# Patient Record
Sex: Female | Born: 1950
Health system: Southern US, Community
[De-identification: ages and names within clinical notes are randomized; demographics above are authoritative.]

## PROBLEM LIST (undated history)

## (undated) DIAGNOSIS — K219 Gastro-esophageal reflux disease without esophagitis: Secondary | ICD-10-CM

## (undated) DIAGNOSIS — F419 Anxiety disorder, unspecified: Secondary | ICD-10-CM

## (undated) DIAGNOSIS — E611 Iron deficiency: Secondary | ICD-10-CM

## (undated) DIAGNOSIS — L719 Rosacea, unspecified: Secondary | ICD-10-CM

## (undated) DIAGNOSIS — D649 Anemia, unspecified: Secondary | ICD-10-CM

## (undated) DIAGNOSIS — E039 Hypothyroidism, unspecified: Secondary | ICD-10-CM

## (undated) DIAGNOSIS — K5792 Diverticulitis of intestine, part unspecified, without perforation or abscess without bleeding: Secondary | ICD-10-CM

## (undated) DIAGNOSIS — J189 Pneumonia, unspecified organism: Secondary | ICD-10-CM

## (undated) DIAGNOSIS — F32A Depression, unspecified: Secondary | ICD-10-CM

## (undated) DIAGNOSIS — U071 COVID-19: Secondary | ICD-10-CM

## (undated) DIAGNOSIS — M1990 Unspecified osteoarthritis, unspecified site: Secondary | ICD-10-CM

## (undated) DIAGNOSIS — Z87442 Personal history of urinary calculi: Secondary | ICD-10-CM

## (undated) HISTORY — PX: APPENDECTOMY: SHX54

## (undated) HISTORY — PX: GASTRECTOMY: SHX58

## (undated) HISTORY — DX: Anxiety disorder, unspecified: F41.9

## (undated) HISTORY — PX: FRACTURE SURGERY: SHX138

## (undated) HISTORY — PX: COLONOSCOPY: SHX174

## (undated) HISTORY — PX: TUBAL LIGATION: SHX77

## (undated) HISTORY — PX: CATARACT EXTRACTION, BILATERAL: SHX1313

## (undated) HISTORY — PX: LITHOTRIPSY: SUR834

---

## 1977-08-03 HISTORY — PX: CHOLECYSTECTOMY OPEN: SUR202

## 1998-01-03 ENCOUNTER — Encounter: Admission: RE | Admit: 1998-01-03 | Discharge: 1998-04-03 | Payer: Self-pay | Admitting: Oncology

## 1998-01-04 ENCOUNTER — Ambulatory Visit (HOSPITAL_COMMUNITY): Admission: RE | Admit: 1998-01-04 | Discharge: 1998-01-04 | Payer: Self-pay | Admitting: Hematology and Oncology

## 1998-01-14 ENCOUNTER — Other Ambulatory Visit: Admission: RE | Admit: 1998-01-14 | Discharge: 1998-01-14 | Payer: Self-pay | Admitting: Oncology

## 1998-01-29 ENCOUNTER — Observation Stay (HOSPITAL_COMMUNITY): Admission: AD | Admit: 1998-01-29 | Discharge: 1998-01-30 | Payer: Self-pay | Admitting: Oncology

## 1998-02-15 ENCOUNTER — Observation Stay (HOSPITAL_COMMUNITY): Admission: AD | Admit: 1998-02-15 | Discharge: 1998-02-15 | Payer: Self-pay | Admitting: Oncology

## 1998-02-18 ENCOUNTER — Ambulatory Visit (HOSPITAL_COMMUNITY): Admission: RE | Admit: 1998-02-18 | Discharge: 1998-02-18 | Payer: Self-pay | Admitting: Oncology

## 1998-02-22 ENCOUNTER — Observation Stay (HOSPITAL_COMMUNITY): Admission: RE | Admit: 1998-02-22 | Discharge: 1998-02-22 | Payer: Self-pay | Admitting: Oncology

## 1998-03-27 ENCOUNTER — Ambulatory Visit (HOSPITAL_COMMUNITY): Admission: RE | Admit: 1998-03-27 | Discharge: 1998-03-27 | Payer: Self-pay | Admitting: Oncology

## 1998-03-27 ENCOUNTER — Encounter: Payer: Self-pay | Admitting: Oncology

## 1998-04-05 ENCOUNTER — Encounter: Admission: RE | Admit: 1998-04-05 | Discharge: 1998-07-04 | Payer: Self-pay | Admitting: Oncology

## 1998-04-06 ENCOUNTER — Observation Stay (HOSPITAL_COMMUNITY): Admission: AD | Admit: 1998-04-06 | Discharge: 1998-04-06 | Payer: Self-pay | Admitting: Oncology

## 1998-04-12 ENCOUNTER — Ambulatory Visit (HOSPITAL_COMMUNITY): Admission: RE | Admit: 1998-04-12 | Discharge: 1998-04-12 | Payer: Self-pay | Admitting: Gastroenterology

## 1998-04-17 ENCOUNTER — Ambulatory Visit (HOSPITAL_COMMUNITY): Admission: RE | Admit: 1998-04-17 | Discharge: 1998-04-17 | Payer: Self-pay | Admitting: Oncology

## 1998-04-22 ENCOUNTER — Ambulatory Visit (HOSPITAL_COMMUNITY): Admission: RE | Admit: 1998-04-22 | Discharge: 1998-04-22 | Payer: Self-pay | Admitting: Gastroenterology

## 1998-05-24 ENCOUNTER — Inpatient Hospital Stay (HOSPITAL_COMMUNITY): Admission: AD | Admit: 1998-05-24 | Discharge: 1998-05-24 | Payer: Self-pay | Admitting: Oncology

## 1998-06-05 ENCOUNTER — Other Ambulatory Visit: Admission: RE | Admit: 1998-06-05 | Discharge: 1998-06-05 | Payer: Self-pay | Admitting: Obstetrics and Gynecology

## 1998-07-31 ENCOUNTER — Encounter: Admission: RE | Admit: 1998-07-31 | Discharge: 1998-08-26 | Payer: Self-pay | Admitting: Oncology

## 1998-12-25 ENCOUNTER — Encounter: Admission: RE | Admit: 1998-12-25 | Discharge: 1999-03-25 | Payer: Self-pay | Admitting: Oncology

## 1998-12-26 ENCOUNTER — Observation Stay (HOSPITAL_COMMUNITY): Admission: AD | Admit: 1998-12-26 | Discharge: 1998-12-26 | Payer: Self-pay | Admitting: Oncology

## 1999-02-06 ENCOUNTER — Ambulatory Visit (HOSPITAL_COMMUNITY): Admission: RE | Admit: 1999-02-06 | Discharge: 1999-02-06 | Payer: Self-pay | Admitting: Oncology

## 1999-05-05 ENCOUNTER — Encounter: Admission: RE | Admit: 1999-05-05 | Discharge: 1999-08-03 | Payer: Self-pay | Admitting: Oncology

## 1999-08-13 ENCOUNTER — Other Ambulatory Visit: Admission: RE | Admit: 1999-08-13 | Discharge: 1999-08-13 | Payer: Self-pay | Admitting: Obstetrics and Gynecology

## 2000-11-16 ENCOUNTER — Other Ambulatory Visit: Admission: RE | Admit: 2000-11-16 | Discharge: 2000-11-16 | Payer: Self-pay | Admitting: Obstetrics and Gynecology

## 2002-10-10 ENCOUNTER — Other Ambulatory Visit: Admission: RE | Admit: 2002-10-10 | Discharge: 2002-10-10 | Payer: Self-pay | Admitting: Oncology

## 2002-10-11 ENCOUNTER — Encounter (HOSPITAL_COMMUNITY): Admission: RE | Admit: 2002-10-11 | Discharge: 2002-10-11 | Payer: Self-pay | Admitting: Oncology

## 2003-01-09 ENCOUNTER — Ambulatory Visit (HOSPITAL_COMMUNITY): Admission: RE | Admit: 2003-01-09 | Discharge: 2003-01-09 | Payer: Self-pay | Admitting: Gastroenterology

## 2003-07-25 ENCOUNTER — Encounter (HOSPITAL_COMMUNITY): Admission: RE | Admit: 2003-07-25 | Discharge: 2003-10-23 | Payer: Self-pay | Admitting: Oncology

## 2004-06-17 ENCOUNTER — Ambulatory Visit: Payer: Self-pay | Admitting: Oncology

## 2004-08-28 ENCOUNTER — Ambulatory Visit: Payer: Self-pay | Admitting: Oncology

## 2004-10-22 ENCOUNTER — Ambulatory Visit: Payer: Self-pay | Admitting: Oncology

## 2004-12-17 ENCOUNTER — Ambulatory Visit: Payer: Self-pay | Admitting: Oncology

## 2005-03-16 ENCOUNTER — Ambulatory Visit: Payer: Self-pay | Admitting: Oncology

## 2005-05-11 ENCOUNTER — Ambulatory Visit: Payer: Self-pay | Admitting: Oncology

## 2005-07-06 ENCOUNTER — Ambulatory Visit: Payer: Self-pay | Admitting: Oncology

## 2005-09-07 ENCOUNTER — Ambulatory Visit: Payer: Self-pay | Admitting: Oncology

## 2005-11-10 ENCOUNTER — Ambulatory Visit: Payer: Self-pay | Admitting: Oncology

## 2005-11-10 LAB — CBC WITH DIFFERENTIAL/PLATELET
Basophils Absolute: 0 10*3/uL (ref 0.0–0.1)
Eosinophils Absolute: 0.1 10*3/uL (ref 0.0–0.5)
HCT: 33.9 % — ABNORMAL LOW (ref 34.8–46.6)
HGB: 11.2 g/dL — ABNORMAL LOW (ref 11.6–15.9)
MCH: 33.6 pg (ref 26.0–34.0)
MONO#: 0.4 10*3/uL (ref 0.1–0.9)
NEUT#: 1.6 10*3/uL (ref 1.5–6.5)
NEUT%: 44.4 % (ref 39.6–76.8)
RDW: 19.3 % — ABNORMAL HIGH (ref 11.3–14.5)
lymph#: 1.4 10*3/uL (ref 0.9–3.3)

## 2005-11-10 LAB — VITAMIN B12: Vitamin B-12: 482 pg/mL (ref 211–911)

## 2005-12-02 ENCOUNTER — Encounter: Payer: Self-pay | Admitting: Gastroenterology

## 2006-01-05 ENCOUNTER — Ambulatory Visit: Payer: Self-pay | Admitting: Oncology

## 2006-01-07 LAB — CBC WITH DIFFERENTIAL/PLATELET
Eosinophils Absolute: 0.2 10*3/uL (ref 0.0–0.5)
HCT: 36.7 % (ref 34.8–46.6)
LYMPH%: 34.1 % (ref 14.0–48.0)
MCV: 103 fL — ABNORMAL HIGH (ref 81.0–101.0)
MONO#: 0.5 10*3/uL (ref 0.1–0.9)
MONO%: 10.7 % (ref 0.0–13.0)
NEUT#: 2.3 10*3/uL (ref 1.5–6.5)
NEUT%: 50.8 % (ref 39.6–76.8)
Platelets: 300 10*3/uL (ref 145–400)
RBC: 3.57 10*6/uL — ABNORMAL LOW (ref 3.70–5.32)

## 2006-01-07 LAB — FERRITIN: Ferritin: 39 ng/mL (ref 10–291)

## 2006-03-02 ENCOUNTER — Ambulatory Visit: Payer: Self-pay | Admitting: Oncology

## 2006-03-04 LAB — CBC WITH DIFFERENTIAL/PLATELET
BASO%: 0.3 % (ref 0.0–2.0)
MCHC: 34.3 g/dL (ref 32.0–36.0)
MONO#: 0.4 10*3/uL (ref 0.1–0.9)
RBC: 3.4 10*6/uL — ABNORMAL LOW (ref 3.70–5.32)
WBC: 4.8 10*3/uL (ref 3.9–10.0)
lymph#: 1.5 10*3/uL (ref 0.9–3.3)

## 2006-03-04 LAB — FERRITIN: Ferritin: 19 ng/mL (ref 10–291)

## 2006-04-21 ENCOUNTER — Ambulatory Visit: Payer: Self-pay | Admitting: Oncology

## 2006-04-21 LAB — CBC WITH DIFFERENTIAL/PLATELET
Basophils Absolute: 0 10*3/uL (ref 0.0–0.1)
EOS%: 3.2 % (ref 0.0–7.0)
Eosinophils Absolute: 0.1 10*3/uL (ref 0.0–0.5)
HGB: 11.6 g/dL (ref 11.6–15.9)
MCH: 36 pg — ABNORMAL HIGH (ref 26.0–34.0)
NEUT#: 2.4 10*3/uL (ref 1.5–6.5)
RBC: 3.22 10*6/uL — ABNORMAL LOW (ref 3.70–5.32)
RDW: 13.3 % (ref 11.3–14.5)
lymph#: 1.3 10*3/uL (ref 0.9–3.3)

## 2006-04-21 LAB — FERRITIN: Ferritin: 12 ng/mL (ref 10–291)

## 2006-04-21 LAB — VITAMIN B12: Vitamin B-12: 408 pg/mL (ref 211–911)

## 2006-04-29 LAB — APTT: aPTT: 31 seconds (ref 24–37)

## 2006-05-06 ENCOUNTER — Ambulatory Visit (HOSPITAL_COMMUNITY): Admission: RE | Admit: 2006-05-06 | Discharge: 2006-05-06 | Payer: Self-pay | Admitting: Urology

## 2006-07-29 ENCOUNTER — Ambulatory Visit: Payer: Self-pay | Admitting: Oncology

## 2006-08-04 LAB — CBC WITH DIFFERENTIAL/PLATELET
BASO%: 0.5 % (ref 0.0–2.0)
Eosinophils Absolute: 0.2 10*3/uL (ref 0.0–0.5)
HCT: 37.3 % (ref 34.8–46.6)
HGB: 12.5 g/dL (ref 11.6–15.9)
LYMPH%: 28.6 % (ref 14.0–48.0)
MCHC: 33.5 g/dL (ref 32.0–36.0)
MONO#: 0.5 10*3/uL (ref 0.1–0.9)
NEUT#: 3 10*3/uL (ref 1.5–6.5)
NEUT%: 58 % (ref 39.6–76.8)
Platelets: 314 10*3/uL (ref 145–400)
WBC: 5.2 10*3/uL (ref 3.9–10.0)
lymph#: 1.5 10*3/uL (ref 0.9–3.3)

## 2006-10-28 ENCOUNTER — Ambulatory Visit: Payer: Self-pay | Admitting: Oncology

## 2006-11-02 LAB — CBC WITH DIFFERENTIAL/PLATELET
Basophils Absolute: 0 10*3/uL (ref 0.0–0.1)
Eosinophils Absolute: 0.1 10*3/uL (ref 0.0–0.5)
HCT: 32.1 % — ABNORMAL LOW (ref 34.8–46.6)
HGB: 11 g/dL — ABNORMAL LOW (ref 11.6–15.9)
MCH: 35.5 pg — ABNORMAL HIGH (ref 26.0–34.0)
MCV: 103.5 fL — ABNORMAL HIGH (ref 81.0–101.0)
MONO%: 10.4 % (ref 0.0–13.0)
NEUT#: 3.3 10*3/uL (ref 1.5–6.5)
NEUT%: 63.7 % (ref 39.6–76.8)
RDW: 13.5 % (ref 11.3–14.5)
lymph#: 1.2 10*3/uL (ref 0.9–3.3)

## 2006-11-02 LAB — VITAMIN B12: Vitamin B-12: 505 pg/mL (ref 211–911)

## 2006-11-02 LAB — BASIC METABOLIC PANEL
BUN: 15 mg/dL (ref 6–23)
Creatinine, Ser: 0.98 mg/dL (ref 0.40–1.20)
Glucose, Bld: 102 mg/dL — ABNORMAL HIGH (ref 70–99)
Potassium: 3.7 mEq/L (ref 3.5–5.3)

## 2006-11-02 LAB — FERRITIN: Ferritin: 24 ng/mL (ref 10–291)

## 2006-11-05 ENCOUNTER — Emergency Department (HOSPITAL_COMMUNITY): Admission: EM | Admit: 2006-11-05 | Discharge: 2006-11-05 | Payer: Self-pay | Admitting: Emergency Medicine

## 2006-11-06 ENCOUNTER — Inpatient Hospital Stay (HOSPITAL_COMMUNITY): Admission: EM | Admit: 2006-11-06 | Discharge: 2006-11-18 | Payer: Self-pay | Admitting: Emergency Medicine

## 2006-11-06 ENCOUNTER — Ambulatory Visit: Payer: Self-pay | Admitting: Internal Medicine

## 2006-11-08 ENCOUNTER — Ambulatory Visit: Payer: Self-pay | Admitting: Oncology

## 2006-11-09 ENCOUNTER — Encounter: Payer: Self-pay | Admitting: Vascular Surgery

## 2006-11-22 ENCOUNTER — Ambulatory Visit (HOSPITAL_COMMUNITY): Admission: RE | Admit: 2006-11-22 | Discharge: 2006-11-22 | Payer: Self-pay | Admitting: Oncology

## 2006-11-22 LAB — CBC WITH DIFFERENTIAL/PLATELET
BASO%: 1.3 % (ref 0.0–2.0)
HCT: 32.3 % — ABNORMAL LOW (ref 34.8–46.6)
MCHC: 34.9 g/dL (ref 32.0–36.0)
MONO#: 0.7 10*3/uL (ref 0.1–0.9)
NEUT%: 73.1 % (ref 39.6–76.8)
RBC: 3.47 10*6/uL — ABNORMAL LOW (ref 3.70–5.32)
RDW: 13.3 % (ref 11.3–14.5)
WBC: 6.9 10*3/uL (ref 3.9–10.0)
lymph#: 1 10*3/uL (ref 0.9–3.3)

## 2006-11-22 LAB — HOLD TUBE, BLOOD BANK

## 2006-12-16 ENCOUNTER — Ambulatory Visit: Payer: Self-pay | Admitting: Oncology

## 2006-12-16 LAB — CBC WITH DIFFERENTIAL/PLATELET
Basophils Absolute: 0 10*3/uL (ref 0.0–0.1)
Eosinophils Absolute: 0.1 10*3/uL (ref 0.0–0.5)
HGB: 10.4 g/dL — ABNORMAL LOW (ref 11.6–15.9)
NEUT#: 2.8 10*3/uL (ref 1.5–6.5)
RDW: 23.1 % — ABNORMAL HIGH (ref 11.3–14.5)
WBC: 5 10*3/uL (ref 3.9–10.0)
lymph#: 1.5 10*3/uL (ref 0.9–3.3)

## 2006-12-16 LAB — HOLD TUBE, BLOOD BANK

## 2006-12-30 LAB — CBC WITH DIFFERENTIAL/PLATELET
Basophils Absolute: 0 10*3/uL (ref 0.0–0.1)
Eosinophils Absolute: 0.1 10*3/uL (ref 0.0–0.5)
HGB: 12.6 g/dL (ref 11.6–15.9)
LYMPH%: 19.9 % (ref 14.0–48.0)
MCV: 101.2 fL — ABNORMAL HIGH (ref 81.0–101.0)
MONO#: 0.5 10*3/uL (ref 0.1–0.9)
MONO%: 6.6 % (ref 0.0–13.0)
NEUT#: 6 10*3/uL (ref 1.5–6.5)
Platelets: 331 10*3/uL (ref 145–400)
RDW: 21.6 % — ABNORMAL HIGH (ref 11.3–14.5)
WBC: 8.2 10*3/uL (ref 3.9–10.0)

## 2007-01-04 ENCOUNTER — Inpatient Hospital Stay (HOSPITAL_COMMUNITY): Admission: EM | Admit: 2007-01-04 | Discharge: 2007-01-06 | Payer: Self-pay | Admitting: Emergency Medicine

## 2007-01-15 ENCOUNTER — Emergency Department (HOSPITAL_COMMUNITY): Admission: EM | Admit: 2007-01-15 | Discharge: 2007-01-15 | Payer: Self-pay | Admitting: Emergency Medicine

## 2007-01-19 LAB — BASIC METABOLIC PANEL
BUN: 12 mg/dL (ref 6–23)
CO2: 19 mEq/L (ref 19–32)
Chloride: 115 mEq/L — ABNORMAL HIGH (ref 96–112)
Creatinine, Ser: 1.11 mg/dL (ref 0.40–1.20)
Glucose, Bld: 102 mg/dL — ABNORMAL HIGH (ref 70–99)
Potassium: 4.2 mEq/L (ref 3.5–5.3)

## 2007-01-19 LAB — CBC WITH DIFFERENTIAL/PLATELET
BASO%: 0.5 % (ref 0.0–2.0)
Basophils Absolute: 0 10*3/uL (ref 0.0–0.1)
HCT: 33.1 % — ABNORMAL LOW (ref 34.8–46.6)
HGB: 11.1 g/dL — ABNORMAL LOW (ref 11.6–15.9)
LYMPH%: 25.1 % (ref 14.0–48.0)
MCHC: 33.5 g/dL (ref 32.0–36.0)
MONO#: 0.3 10*3/uL (ref 0.1–0.9)
NEUT%: 63.8 % (ref 39.6–76.8)
Platelets: 253 10*3/uL (ref 145–400)
WBC: 3.8 10*3/uL — ABNORMAL LOW (ref 3.9–10.0)

## 2007-02-14 ENCOUNTER — Ambulatory Visit: Payer: Self-pay | Admitting: Endocrinology

## 2007-02-15 ENCOUNTER — Encounter: Payer: Self-pay | Admitting: Endocrinology

## 2007-02-15 LAB — CONVERTED CEMR LAB
Calcium, Total (PTH): 8.5 mg/dL (ref 8.4–10.5)
PTH: 18.1 pg/mL (ref 14.0–72.0)

## 2007-02-16 ENCOUNTER — Emergency Department (HOSPITAL_COMMUNITY): Admission: EM | Admit: 2007-02-16 | Discharge: 2007-02-16 | Payer: Self-pay | Admitting: Emergency Medicine

## 2007-02-21 ENCOUNTER — Ambulatory Visit: Payer: Self-pay | Admitting: Oncology

## 2007-02-24 LAB — CBC WITH DIFFERENTIAL/PLATELET
BASO%: 0.4 % (ref 0.0–2.0)
HCT: 34.2 % — ABNORMAL LOW (ref 34.8–46.6)
LYMPH%: 32.1 % (ref 14.0–48.0)
MCHC: 34.2 g/dL (ref 32.0–36.0)
MCV: 103.6 fL — ABNORMAL HIGH (ref 81.0–101.0)
MONO#: 0.4 10*3/uL (ref 0.1–0.9)
MONO%: 9.4 % (ref 0.0–13.0)
NEUT%: 55.9 % (ref 39.6–76.8)
Platelets: 253 10*3/uL (ref 145–400)
RBC: 3.3 10*6/uL — ABNORMAL LOW (ref 3.70–5.32)
WBC: 4.5 10*3/uL (ref 3.9–10.0)

## 2007-02-24 LAB — FERRITIN: Ferritin: 12 ng/mL (ref 10–291)

## 2007-02-24 LAB — BASIC METABOLIC PANEL
CO2: 19 mEq/L (ref 19–32)
Calcium: 8.5 mg/dL (ref 8.4–10.5)
Creatinine, Ser: 1.11 mg/dL (ref 0.40–1.20)
Glucose, Bld: 114 mg/dL — ABNORMAL HIGH (ref 70–99)

## 2007-03-28 ENCOUNTER — Emergency Department (HOSPITAL_COMMUNITY): Admission: EM | Admit: 2007-03-28 | Discharge: 2007-03-28 | Payer: Self-pay | Admitting: Emergency Medicine

## 2007-04-13 ENCOUNTER — Ambulatory Visit: Payer: Self-pay | Admitting: Oncology

## 2007-04-15 LAB — CBC WITH DIFFERENTIAL/PLATELET
Basophils Absolute: 0 10*3/uL (ref 0.0–0.1)
Eosinophils Absolute: 0.1 10*3/uL (ref 0.0–0.5)
HCT: 36.2 % (ref 34.8–46.6)
HGB: 12.4 g/dL (ref 11.6–15.9)
LYMPH%: 24 % (ref 14.0–48.0)
MCV: 98.4 fL (ref 81.0–101.0)
MONO#: 0.4 10*3/uL (ref 0.1–0.9)
MONO%: 7.9 % (ref 0.0–13.0)
NEUT#: 3.1 10*3/uL (ref 1.5–6.5)
NEUT%: 65.5 % (ref 39.6–76.8)
Platelets: 282 10*3/uL (ref 145–400)
WBC: 4.8 10*3/uL (ref 3.9–10.0)

## 2007-04-15 LAB — FERRITIN: Ferritin: 6 ng/mL — ABNORMAL LOW (ref 10–291)

## 2007-05-05 LAB — CBC WITH DIFFERENTIAL/PLATELET
BASO%: 0.2 % (ref 0.0–2.0)
Basophils Absolute: 0 10*3/uL (ref 0.0–0.1)
EOS%: 3.1 % (ref 0.0–7.0)
HCT: 31.6 % — ABNORMAL LOW (ref 34.8–46.6)
HGB: 11.1 g/dL — ABNORMAL LOW (ref 11.6–15.9)
LYMPH%: 31.5 % (ref 14.0–48.0)
MCH: 33.9 pg (ref 26.0–34.0)
MCHC: 35.1 g/dL (ref 32.0–36.0)
NEUT%: 53 % (ref 39.6–76.8)
Platelets: 292 10*3/uL (ref 145–400)
lymph#: 1.5 10*3/uL (ref 0.9–3.3)

## 2007-06-14 ENCOUNTER — Ambulatory Visit: Payer: Self-pay | Admitting: Oncology

## 2007-06-16 LAB — FERRITIN: Ferritin: 81 ng/mL (ref 10–291)

## 2007-06-16 LAB — CBC WITH DIFFERENTIAL/PLATELET
Eosinophils Absolute: 0.1 10*3/uL (ref 0.0–0.5)
HCT: 37.3 % (ref 34.8–46.6)
LYMPH%: 34.8 % (ref 14.0–48.0)
MCV: 97.4 fL (ref 81.0–101.0)
MONO#: 0.4 10*3/uL (ref 0.1–0.9)
MONO%: 9.1 % (ref 0.0–13.0)
NEUT#: 2.2 10*3/uL (ref 1.5–6.5)
NEUT%: 52.6 % (ref 39.6–76.8)
Platelets: 248 10*3/uL (ref 145–400)
WBC: 4.3 10*3/uL (ref 3.9–10.0)

## 2007-08-01 ENCOUNTER — Ambulatory Visit: Payer: Self-pay | Admitting: Oncology

## 2007-08-03 LAB — CBC WITH DIFFERENTIAL/PLATELET
BASO%: 0.7 % (ref 0.0–2.0)
Basophils Absolute: 0 10*3/uL (ref 0.0–0.1)
HCT: 36.7 % (ref 34.8–46.6)
HGB: 12.8 g/dL (ref 11.6–15.9)
MCHC: 35 g/dL (ref 32.0–36.0)
MONO#: 0.4 10*3/uL (ref 0.1–0.9)
NEUT%: 53.9 % (ref 39.6–76.8)
WBC: 4.2 10*3/uL (ref 3.9–10.0)
lymph#: 1.3 10*3/uL (ref 0.9–3.3)

## 2007-09-08 ENCOUNTER — Encounter: Payer: Self-pay | Admitting: Endocrinology

## 2007-09-08 LAB — CBC WITH DIFFERENTIAL/PLATELET
BASO%: 0.8 % (ref 0.0–2.0)
EOS%: 2.1 % (ref 0.0–7.0)
MCH: 32.4 pg (ref 26.0–34.0)
MCV: 97.3 fL (ref 81.0–101.0)
MONO%: 11.3 % (ref 0.0–13.0)
NEUT#: 3 10*3/uL (ref 1.5–6.5)
RBC: 3.95 10*6/uL (ref 3.70–5.32)
RDW: 14.2 % (ref 11.3–14.5)

## 2007-09-08 LAB — FERRITIN: Ferritin: 14 ng/mL (ref 10–291)

## 2007-09-16 ENCOUNTER — Ambulatory Visit: Payer: Self-pay | Admitting: Oncology

## 2007-11-01 ENCOUNTER — Ambulatory Visit: Payer: Self-pay | Admitting: Oncology

## 2007-11-03 LAB — CBC WITH DIFFERENTIAL/PLATELET
Basophils Absolute: 0 10*3/uL (ref 0.0–0.1)
EOS%: 1.9 % (ref 0.0–7.0)
HCT: 36.9 % (ref 34.8–46.6)
HGB: 12.9 g/dL (ref 11.6–15.9)
MCH: 34.3 pg — ABNORMAL HIGH (ref 26.0–34.0)
NEUT%: 58.5 % (ref 39.6–76.8)
lymph#: 1.4 10*3/uL (ref 0.9–3.3)

## 2008-01-03 ENCOUNTER — Ambulatory Visit: Payer: Self-pay | Admitting: Oncology

## 2008-01-05 ENCOUNTER — Encounter: Payer: Self-pay | Admitting: Endocrinology

## 2008-01-05 LAB — CBC WITH DIFFERENTIAL/PLATELET
Eosinophils Absolute: 0.1 10*3/uL (ref 0.0–0.5)
HCT: 38.6 % (ref 34.8–46.6)
HGB: 13 g/dL (ref 11.6–15.9)
LYMPH%: 33.7 % (ref 14.0–48.0)
MONO#: 0.5 10*3/uL (ref 0.1–0.9)
NEUT#: 2.4 10*3/uL (ref 1.5–6.5)
NEUT%: 51.8 % (ref 39.6–76.8)
Platelets: 323 10*3/uL (ref 145–400)
RBC: 3.89 10*6/uL (ref 3.70–5.32)
WBC: 4.7 10*3/uL (ref 3.9–10.0)

## 2008-01-05 LAB — FERRITIN: Ferritin: 37 ng/mL (ref 10–291)

## 2008-01-31 ENCOUNTER — Encounter: Payer: Self-pay | Admitting: Endocrinology

## 2008-02-14 ENCOUNTER — Ambulatory Visit: Payer: Self-pay | Admitting: Endocrinology

## 2008-02-14 DIAGNOSIS — Z87442 Personal history of urinary calculi: Secondary | ICD-10-CM

## 2008-02-16 ENCOUNTER — Ambulatory Visit: Payer: Self-pay | Admitting: Endocrinology

## 2008-03-05 ENCOUNTER — Ambulatory Visit: Payer: Self-pay | Admitting: Oncology

## 2008-03-08 LAB — CBC WITH DIFFERENTIAL/PLATELET
BASO%: 0.7 % (ref 0.0–2.0)
HCT: 36.7 % (ref 34.8–46.6)
HGB: 12.6 g/dL (ref 11.6–15.9)
MCHC: 34.2 g/dL (ref 32.0–36.0)
MONO#: 0.5 10*3/uL (ref 0.1–0.9)
NEUT%: 57.9 % (ref 39.6–76.8)
RDW: 13.9 % (ref 11.3–14.5)
WBC: 5 10*3/uL (ref 3.9–10.0)
lymph#: 1.4 10*3/uL (ref 0.9–3.3)

## 2008-04-05 ENCOUNTER — Ambulatory Visit (HOSPITAL_COMMUNITY): Admission: RE | Admit: 2008-04-05 | Discharge: 2008-04-05 | Payer: Self-pay | Admitting: Urology

## 2008-04-26 ENCOUNTER — Ambulatory Visit: Payer: Self-pay | Admitting: Oncology

## 2008-05-03 ENCOUNTER — Encounter: Payer: Self-pay | Admitting: Endocrinology

## 2008-05-03 LAB — CBC WITH DIFFERENTIAL/PLATELET
BASO%: 1 % (ref 0.0–2.0)
Eosinophils Absolute: 0.2 10*3/uL (ref 0.0–0.5)
MCHC: 33.7 g/dL (ref 32.0–36.0)
MONO#: 0.6 10*3/uL (ref 0.1–0.9)
MONO%: 10.6 % (ref 0.0–13.0)
NEUT#: 3.1 10*3/uL (ref 1.5–6.5)
RBC: 3.64 10*6/uL — ABNORMAL LOW (ref 3.70–5.32)
RDW: 13.5 % (ref 11.3–14.5)
WBC: 5.5 10*3/uL (ref 3.9–10.0)

## 2008-05-03 LAB — FERRITIN: Ferritin: 20 ng/mL (ref 10–291)

## 2008-07-03 ENCOUNTER — Ambulatory Visit: Payer: Self-pay | Admitting: Oncology

## 2008-07-05 LAB — CBC WITH DIFFERENTIAL/PLATELET
BASO%: 0.6 % (ref 0.0–2.0)
EOS%: 3.4 % (ref 0.0–7.0)
HGB: 11.8 g/dL (ref 11.6–15.9)
MCH: 33.8 pg (ref 26.0–34.0)
MCHC: 34.2 g/dL (ref 32.0–36.0)
RBC: 3.48 10*6/uL — ABNORMAL LOW (ref 3.70–5.32)
RDW: 14.9 % — ABNORMAL HIGH (ref 11.3–14.5)
lymph#: 1.6 10*3/uL (ref 0.9–3.3)

## 2008-09-04 ENCOUNTER — Ambulatory Visit: Payer: Self-pay | Admitting: Oncology

## 2008-09-13 ENCOUNTER — Encounter: Payer: Self-pay | Admitting: Endocrinology

## 2008-11-01 ENCOUNTER — Ambulatory Visit: Payer: Self-pay | Admitting: Oncology

## 2008-11-01 LAB — CBC WITH DIFFERENTIAL/PLATELET
BASO%: 0.7 % (ref 0.0–2.0)
Basophils Absolute: 0 10*3/uL (ref 0.0–0.1)
EOS%: 4.3 % (ref 0.0–7.0)
MCH: 33.9 pg (ref 25.1–34.0)
MCHC: 33.8 g/dL (ref 31.5–36.0)
MCV: 100.3 fL (ref 79.5–101.0)
MONO%: 9.8 % (ref 0.0–14.0)
NEUT%: 56.2 % (ref 38.4–76.8)
RDW: 14.6 % — ABNORMAL HIGH (ref 11.2–14.5)
lymph#: 1.3 10*3/uL (ref 0.9–3.3)

## 2008-11-01 LAB — FERRITIN: Ferritin: 19 ng/mL (ref 10–291)

## 2009-01-07 ENCOUNTER — Ambulatory Visit: Payer: Self-pay | Admitting: Oncology

## 2009-01-07 LAB — CBC WITH DIFFERENTIAL/PLATELET
BASO%: 0.6 % (ref 0.0–2.0)
HCT: 33.3 % — ABNORMAL LOW (ref 34.8–46.6)
LYMPH%: 27.5 % (ref 14.0–49.7)
MCH: 33 pg (ref 25.1–34.0)
MCHC: 33 g/dL (ref 31.5–36.0)
MCV: 100 fL (ref 79.5–101.0)
MONO%: 10.2 % (ref 0.0–14.0)
NEUT%: 59.4 % (ref 38.4–76.8)
Platelets: 194 10*3/uL (ref 145–400)
RBC: 3.33 10*6/uL — ABNORMAL LOW (ref 3.70–5.45)

## 2009-01-07 LAB — FERRITIN: Ferritin: 57 ng/mL (ref 10–291)

## 2009-02-07 ENCOUNTER — Ambulatory Visit: Payer: Self-pay | Admitting: Oncology

## 2009-02-07 LAB — CBC WITH DIFFERENTIAL/PLATELET
Basophils Absolute: 0 10*3/uL (ref 0.0–0.1)
EOS%: 3.4 % (ref 0.0–7.0)
HCT: 32.6 % — ABNORMAL LOW (ref 34.8–46.6)
HGB: 11.4 g/dL — ABNORMAL LOW (ref 11.6–15.9)
LYMPH%: 29.7 % (ref 14.0–49.7)
MCH: 34.8 pg — ABNORMAL HIGH (ref 25.1–34.0)
MCV: 99.8 fL (ref 79.5–101.0)
MONO%: 7.9 % (ref 0.0–14.0)
NEUT%: 58.3 % (ref 38.4–76.8)
Platelets: 295 10*3/uL (ref 145–400)
RDW: 14.5 % (ref 11.2–14.5)

## 2009-03-12 ENCOUNTER — Ambulatory Visit: Payer: Self-pay | Admitting: Oncology

## 2009-03-14 ENCOUNTER — Encounter: Payer: Self-pay | Admitting: Endocrinology

## 2009-03-14 LAB — FERRITIN: Ferritin: 13 ng/mL (ref 10–291)

## 2009-03-14 LAB — CBC WITH DIFFERENTIAL/PLATELET
BASO%: 0.9 % (ref 0.0–2.0)
EOS%: 2.3 % (ref 0.0–7.0)
MCH: 33.6 pg (ref 25.1–34.0)
MCHC: 33.8 g/dL (ref 31.5–36.0)
RDW: 14.2 % (ref 11.2–14.5)
lymph#: 1.4 10*3/uL (ref 0.9–3.3)

## 2009-05-07 ENCOUNTER — Ambulatory Visit: Payer: Self-pay | Admitting: Oncology

## 2009-05-09 LAB — CBC WITH DIFFERENTIAL/PLATELET
BASO%: 0.8 % (ref 0.0–2.0)
LYMPH%: 26.8 % (ref 14.0–49.7)
MCHC: 34.2 g/dL (ref 31.5–36.0)
MCV: 101.4 fL — ABNORMAL HIGH (ref 79.5–101.0)
MONO#: 0.3 10*3/uL (ref 0.1–0.9)
MONO%: 6.6 % (ref 0.0–14.0)
Platelets: 343 10*3/uL (ref 145–400)
RBC: 3.04 10*6/uL — ABNORMAL LOW (ref 3.70–5.45)
RDW: 15.9 % — ABNORMAL HIGH (ref 11.2–14.5)
WBC: 4.5 10*3/uL (ref 3.9–10.3)

## 2009-05-09 LAB — FERRITIN: Ferritin: 53 ng/mL (ref 10–291)

## 2009-06-14 ENCOUNTER — Ambulatory Visit: Payer: Self-pay | Admitting: Oncology

## 2009-06-18 LAB — CBC WITH DIFFERENTIAL/PLATELET
BASO%: 0.7 % (ref 0.0–2.0)
Basophils Absolute: 0 10*3/uL (ref 0.0–0.1)
EOS%: 2.9 % (ref 0.0–7.0)
HGB: 11.8 g/dL (ref 11.6–15.9)
MCH: 33.6 pg (ref 25.1–34.0)
MCHC: 33.5 g/dL (ref 31.5–36.0)
MCV: 100.4 fL (ref 79.5–101.0)
MONO%: 8.3 % (ref 0.0–14.0)
RBC: 3.52 10*6/uL — ABNORMAL LOW (ref 3.70–5.45)
RDW: 14.1 % (ref 11.2–14.5)

## 2009-07-16 ENCOUNTER — Ambulatory Visit: Payer: Self-pay | Admitting: Oncology

## 2009-07-22 LAB — CBC WITH DIFFERENTIAL/PLATELET
BASO%: 0.7 % (ref 0.0–2.0)
Basophils Absolute: 0 10*3/uL (ref 0.0–0.1)
HCT: 37.2 % (ref 34.8–46.6)
HGB: 12.6 g/dL (ref 11.6–15.9)
MCHC: 33.9 g/dL (ref 31.5–36.0)
MONO#: 0.4 10*3/uL (ref 0.1–0.9)
NEUT#: 2.8 10*3/uL (ref 1.5–6.5)
NEUT%: 60.5 % (ref 38.4–76.8)
WBC: 4.6 10*3/uL (ref 3.9–10.3)
lymph#: 1.3 10*3/uL (ref 0.9–3.3)

## 2009-09-05 ENCOUNTER — Ambulatory Visit: Payer: Self-pay | Admitting: Oncology

## 2009-09-05 ENCOUNTER — Encounter: Payer: Self-pay | Admitting: Endocrinology

## 2009-09-05 LAB — CBC WITH DIFFERENTIAL/PLATELET
Basophils Absolute: 0 10*3/uL (ref 0.0–0.1)
EOS%: 3 % (ref 0.0–7.0)
Eosinophils Absolute: 0.2 10*3/uL (ref 0.0–0.5)
HCT: 42.1 % (ref 34.8–46.6)
HGB: 14.1 g/dL (ref 11.6–15.9)
MCH: 33.7 pg (ref 25.1–34.0)
MCV: 100.5 fL (ref 79.5–101.0)
NEUT#: 3.9 10*3/uL (ref 1.5–6.5)
NEUT%: 67.2 % (ref 38.4–76.8)
RDW: 14.5 % (ref 11.2–14.5)
lymph#: 1.2 10*3/uL (ref 0.9–3.3)

## 2009-09-26 LAB — CBC WITH DIFFERENTIAL/PLATELET
Basophils Absolute: 0 10*3/uL (ref 0.0–0.1)
Eosinophils Absolute: 0.2 10*3/uL (ref 0.0–0.5)
HCT: 39.8 % (ref 34.8–46.6)
LYMPH%: 16.7 % (ref 14.0–49.7)
MCV: 100.5 fL (ref 79.5–101.0)
MONO#: 0.5 10*3/uL (ref 0.1–0.9)
MONO%: 8 % (ref 0.0–14.0)
NEUT#: 4.6 10*3/uL (ref 1.5–6.5)
NEUT%: 71.9 % (ref 38.4–76.8)
Platelets: 276 10*3/uL (ref 145–400)
WBC: 6.3 10*3/uL (ref 3.9–10.3)

## 2009-09-26 LAB — FERRITIN: Ferritin: 87 ng/mL (ref 10–291)

## 2009-10-14 ENCOUNTER — Ambulatory Visit: Payer: Self-pay | Admitting: Oncology

## 2009-10-17 LAB — FERRITIN: Ferritin: 77 ng/mL (ref 10–291)

## 2009-10-17 LAB — CBC WITH DIFFERENTIAL/PLATELET
BASO%: 0.7 % (ref 0.0–2.0)
Eosinophils Absolute: 0.1 10*3/uL (ref 0.0–0.5)
HCT: 37.7 % (ref 34.8–46.6)
HGB: 12.9 g/dL (ref 11.6–15.9)
LYMPH%: 23.7 % (ref 14.0–49.7)
MONO#: 0.6 10*3/uL (ref 0.1–0.9)
NEUT#: 3.7 10*3/uL (ref 1.5–6.5)
NEUT%: 63.6 % (ref 38.4–76.8)
Platelets: 314 10*3/uL (ref 145–400)
WBC: 5.7 10*3/uL (ref 3.9–10.3)
lymph#: 1.4 10*3/uL (ref 0.9–3.3)

## 2009-12-02 ENCOUNTER — Ambulatory Visit: Payer: Self-pay | Admitting: Oncology

## 2009-12-03 LAB — CBC WITH DIFFERENTIAL/PLATELET
Basophils Absolute: 0 10*3/uL (ref 0.0–0.1)
Eosinophils Absolute: 0.2 10*3/uL (ref 0.0–0.5)
HGB: 12.9 g/dL (ref 11.6–15.9)
MCV: 101.6 fL — ABNORMAL HIGH (ref 79.5–101.0)
MONO#: 0.5 10*3/uL (ref 0.1–0.9)
NEUT#: 3 10*3/uL (ref 1.5–6.5)
RBC: 3.81 10*6/uL (ref 3.70–5.45)
RDW: 14.9 % — ABNORMAL HIGH (ref 11.2–14.5)
WBC: 5.2 10*3/uL (ref 3.9–10.3)
lymph#: 1.4 10*3/uL (ref 0.9–3.3)

## 2009-12-03 LAB — FERRITIN: Ferritin: 33 ng/mL (ref 10–291)

## 2010-01-03 ENCOUNTER — Ambulatory Visit: Payer: Self-pay | Admitting: Oncology

## 2010-01-07 ENCOUNTER — Encounter: Payer: Self-pay | Admitting: Cardiology

## 2010-01-07 LAB — CBC WITH DIFFERENTIAL/PLATELET
BASO%: 0.6 % (ref 0.0–2.0)
HCT: 35.8 % (ref 34.8–46.6)
MCHC: 34.5 g/dL (ref 31.5–36.0)
MONO#: 0.4 10*3/uL (ref 0.1–0.9)
NEUT%: 58.5 % (ref 38.4–76.8)
RDW: 14.6 % — ABNORMAL HIGH (ref 11.2–14.5)
WBC: 5 10*3/uL (ref 3.9–10.3)
lymph#: 1.4 10*3/uL (ref 0.9–3.3)

## 2010-01-07 LAB — FERRITIN: Ferritin: 32 ng/mL (ref 10–291)

## 2010-02-27 ENCOUNTER — Encounter: Payer: Self-pay | Admitting: Cardiology

## 2010-02-27 ENCOUNTER — Ambulatory Visit: Payer: Self-pay | Admitting: Oncology

## 2010-02-27 LAB — CBC WITH DIFFERENTIAL/PLATELET
BASO%: 0.8 % (ref 0.0–2.0)
Eosinophils Absolute: 0.2 10*3/uL (ref 0.0–0.5)
MCV: 99.2 fL (ref 79.5–101.0)
MONO#: 0.5 10*3/uL (ref 0.1–0.9)
MONO%: 10.7 % (ref 0.0–14.0)
NEUT#: 2.4 10*3/uL (ref 1.5–6.5)
RBC: 3.68 10*6/uL — ABNORMAL LOW (ref 3.70–5.45)
RDW: 14.4 % (ref 11.2–14.5)
WBC: 4.4 10*3/uL (ref 3.9–10.3)

## 2010-02-27 LAB — FERRITIN: Ferritin: 22 ng/mL (ref 10–291)

## 2010-03-06 ENCOUNTER — Encounter: Payer: Self-pay | Admitting: Cardiology

## 2010-03-10 ENCOUNTER — Encounter: Payer: Self-pay | Admitting: Cardiology

## 2010-03-10 LAB — COMPREHENSIVE METABOLIC PANEL
ALT: 11 U/L (ref 0–35)
AST: 20 U/L (ref 0–37)
Albumin: 3.3 g/dL — ABNORMAL LOW (ref 3.5–5.2)
Alkaline Phosphatase: 36 U/L — ABNORMAL LOW (ref 39–117)
Glucose, Bld: 99 mg/dL (ref 70–99)
Potassium: 4.2 mEq/L (ref 3.5–5.3)
Sodium: 144 mEq/L (ref 135–145)
Total Protein: 5 g/dL — ABNORMAL LOW (ref 6.0–8.3)

## 2010-03-10 LAB — CBC WITH DIFFERENTIAL/PLATELET
BASO%: 1.8 % (ref 0.0–2.0)
Basophils Absolute: 0.1 10*3/uL (ref 0.0–0.1)
EOS%: 4.9 % (ref 0.0–7.0)
HGB: 12.2 g/dL (ref 11.6–15.9)
MCH: 32.4 pg (ref 25.1–34.0)
RBC: 3.76 10*6/uL (ref 3.70–5.45)
RDW: 15 % — ABNORMAL HIGH (ref 11.2–14.5)
lymph#: 1.6 10*3/uL (ref 0.9–3.3)

## 2010-03-10 LAB — FERRITIN: Ferritin: 18 ng/mL (ref 10–291)

## 2010-03-11 ENCOUNTER — Ambulatory Visit: Payer: Self-pay | Admitting: Cardiology

## 2010-03-11 DIAGNOSIS — R0602 Shortness of breath: Secondary | ICD-10-CM | POA: Insufficient documentation

## 2010-03-11 DIAGNOSIS — R000 Tachycardia, unspecified: Secondary | ICD-10-CM | POA: Insufficient documentation

## 2010-03-11 DIAGNOSIS — R011 Cardiac murmur, unspecified: Secondary | ICD-10-CM

## 2010-03-11 DIAGNOSIS — I471 Supraventricular tachycardia: Secondary | ICD-10-CM

## 2010-03-13 ENCOUNTER — Encounter: Payer: Self-pay | Admitting: Cardiology

## 2010-03-13 ENCOUNTER — Ambulatory Visit: Payer: Self-pay | Admitting: Cardiology

## 2010-03-19 ENCOUNTER — Encounter (INDEPENDENT_AMBULATORY_CARE_PROVIDER_SITE_OTHER): Payer: Self-pay | Admitting: *Deleted

## 2010-03-19 ENCOUNTER — Encounter: Payer: Self-pay | Admitting: Cardiology

## 2010-04-01 ENCOUNTER — Ambulatory Visit: Payer: Self-pay | Admitting: Oncology

## 2010-04-03 ENCOUNTER — Encounter: Payer: Self-pay | Admitting: Endocrinology

## 2010-04-03 LAB — BASIC METABOLIC PANEL
BUN: 13 mg/dL (ref 6–23)
CO2: 20 mEq/L (ref 19–32)
Chloride: 113 mEq/L — ABNORMAL HIGH (ref 96–112)
Creatinine, Ser: 1.11 mg/dL (ref 0.40–1.20)
Glucose, Bld: 109 mg/dL — ABNORMAL HIGH (ref 70–99)

## 2010-04-03 LAB — CBC WITH DIFFERENTIAL/PLATELET
Basophils Absolute: 0 10*3/uL (ref 0.0–0.1)
HCT: 36.4 % (ref 34.8–46.6)
HGB: 12 g/dL (ref 11.6–15.9)
MCH: 33.1 pg (ref 25.1–34.0)
MONO#: 0.5 10*3/uL (ref 0.1–0.9)
NEUT%: 62.7 % (ref 38.4–76.8)
WBC: 5.9 10*3/uL (ref 3.9–10.3)
lymph#: 1.4 10*3/uL (ref 0.9–3.3)

## 2010-05-19 ENCOUNTER — Ambulatory Visit: Payer: Self-pay | Admitting: Cardiology

## 2010-05-27 ENCOUNTER — Ambulatory Visit: Payer: Self-pay | Admitting: Oncology

## 2010-05-29 LAB — BASIC METABOLIC PANEL
Calcium: 8.1 mg/dL — ABNORMAL LOW (ref 8.4–10.5)
Creatinine, Ser: 1.13 mg/dL (ref 0.40–1.20)
Glucose, Bld: 109 mg/dL — ABNORMAL HIGH (ref 70–99)
Sodium: 145 mEq/L (ref 135–145)

## 2010-05-29 LAB — CBC WITH DIFFERENTIAL/PLATELET
BASO%: 0.6 % (ref 0.0–2.0)
Eosinophils Absolute: 0.1 10*3/uL (ref 0.0–0.5)
LYMPH%: 27.5 % (ref 14.0–49.7)
MCHC: 34.4 g/dL (ref 31.5–36.0)
MCV: 100.1 fL (ref 79.5–101.0)
MONO%: 11.2 % (ref 0.0–14.0)
Platelets: 319 10*3/uL (ref 145–400)
RBC: 3.62 10*6/uL — ABNORMAL LOW (ref 3.70–5.45)

## 2010-05-29 LAB — FERRITIN: Ferritin: 93 ng/mL (ref 10–291)

## 2010-09-04 NOTE — Miscellaneous (Signed)
Summary: T3 T4 ORDER  Clinical Lists Changes  Orders: Added new Test order of T-T3, Free [id number]) - Signed Added new Test order of T-T4, Free [id number]) - Signed

## 2010-09-04 NOTE — Letter (Signed)
Summary: [facility]  [facility]   Imported By: Lester Hayfork 04/16/2010 11:12:01  _____________________________________________________________________  External Attachment:    Type:   Image     Comment:   External Document

## 2010-09-04 NOTE — Assessment & Plan Note (Signed)
Summary: np6/tachycardia/DOE/jml   Visit Type:  Initial Consult Referring Mercy Leppla:  Dr. Mancel Bale Primary Codey Burling:  Kevin Fenton Davis,MD([facility])  CC:  Tachycardia.  History of Present Illness: The patient presents for evaluation of tachycardia palpitations. She has no prior cardiac history. However, she recently saw her hematologist for treatment of her chronic anemia and iron deficiency. She described tachypalpitations recently. 3 years ago she had a long severe septic illness and the question was raised about possible cardiac involvement such as a cardiomyopathy. She was referred here. She is an active person taking care of several family members. She noticed recently that with activities her heart races more than he used to. She's had a little less energy. She is not describing chest pressure, neck or arm discomfort. She is not describing shortness of breath, PND or orthopnea. She feels her heart racing but does not describe irregularity. She will occasionally have some orthostatic dizziness but this is rare and mild. She does note that with her illness a few years ago she was taken off Inderal which she had taken for years for treatment of migraines. She thinks that this helped control her heart rate in the past.  Preventive Screening-Counseling & Management  Alcohol-Tobacco     Smoking Status: never  Current Medications (verified): 1)  Klor-Con 10 10 Meq  Cr-Tabs (Potassium Chloride) .... Take 2 By Mouth Qd 2)  Omeprazole 40 Mg Cpdr (Omeprazole) .... Take 1 Tablet By Mouth Once A Day 3)  Doxycycline Hyclate 100 Mg  Tabs (Doxycycline Hyclate) .... Take 1 By Mouth Qhs 4)  Alprazolam 0.25 Mg Tabs (Alprazolam) .... Take 1/2-1 Tablet By Mouth Three Times A Day As Needed 5)  Metrogel 1 % Gel (Metronidazole) .... Apply Topically Daily 6)  Fish Oil 1000 Mg Caps (Omega-3 Fatty Acids) .... Take 1 Tablet By Mouth Two Times A Day 7)  Caltrate 600+d 600-400 Mg-Unit Tabs (Calcium  Carbonate-Vitamin D) .... Take 1 Tablet By Mouth Three Times A Day 8)  Multivitamins  Tabs (Multiple Vitamin) .... Take 1 Tablet By Mouth Two Times A Day 9)  Acidophilus  Caps (Lactobacillus) .... Take 1 Tablet By Mouth Once A Day 10)  Magnesium Oxide 250 Mg Tabs (Magnesium Oxide) .... Take 1 Tablet By Mouth Once A Day 11)  Nutrifac Zx  Tabs (Multiple Vitamins-Minerals) .... Take 1 Tablet By Mouth Once A Day 12)  Ascorbic Acid 500 Mg Tabs (Ascorbic Acid) .... Take 1 Tablet By Mouth Two Times A Day 13)  Vitamin D3 1000 Unit Caps (Cholecalciferol) .... Take 1 Tablet By Mouth Two Times A Day 14)  Loratadine 10 Mg Tabs (Loratadine) .... Take 1 Tablet By Mouth Once A Day  Allergies: 1)  ! Macrobid 2)  ! Bactrim 3)  ! Reglan 4)  ! * Venofer Iv Iron 5)  ! Pepcid 6)  ! * Clonazepam  Comments:  Nurse/Medical Assistant: The patient's medication list and allergies were reviewed with the patient and were updated in the Medication and Allergy Lists.  Past History:  Past Medical History: Kidney Stones 2009 Chronic anemia 2nd to GI blood loss and iron deficiency Gram - sepsis and septic shock syndrome 11/2006 Acute renal failure 2nd to obstruction and septic shock Left forearm fracture  Past Surgical History: Cholecystectomy Tubal Ligation Gastrectomy for treatment of PUD  Family History: Father alive dementia, CHF later age Mother diabetes Brothers with hypertension  Social History: Alcohol Use - no Disabled  Widowed One son living, one deceased Never smoked Smoking Status:  never  Review of Systems       As stated in the HPI and negative for all other systems.   Vital Signs:  Patient profile:   60 year old female Height:      65 inches Weight:      106 pounds BMI:     17.70 O2 Sat:      98 % on Room air Pulse rate:   90 / minute BP sitting:   120 / 74  (left arm) Cuff size:   regular  Vitals Entered By: Carlye Grippe (March 11, 2010 3:05 PM)  O2 Flow:  Room  air  Physical Exam  General:  Well developed, well nourished, in no acute distress. Head:  normocephalic and atraumatic Eyes:  PERRLA/EOM intact; conjunctiva and lids normal. Mouth:  Teeth, gums and palate normal. Oral mucosa normal. Neck:  Neck supple, no JVD. No masses, thyromegaly or abnormal cervical nodes. Chest Wall:  no deformities or breast masses noted Lungs:  Clear bilaterally to auscultation and percussion. Abdomen:  Bowel sounds positive; abdomen soft and non-tender without masses, organomegaly, or hernias noted. No hepatosplenomegaly. Msk:  Back normal, normal gait. Muscle strength and tone normal. Extremities:  No clubbing or cyanosis. Neurologic:  Alert and oriented x 3. Skin:  Intact without lesions or rashes. Cervical Nodes:  no significant adenopathy Axillary Nodes:  no significant adenopathy Inguinal Nodes:  no significant adenopathy Psych:  Normal affect.   Detailed Cardiovascular Exam  Neck    Carotids: Carotids full and equal bilaterally without bruits.      Neck Veins: Normal, no JVD.    Heart    Inspection: no deformities or lifts noted.      Palpation: normal PMI with no thrills palpable.      Auscultation: regular rate and rhythm, S1, S2 , midsystolic click with very brief systolic murmur, no diastolic murmurs  Vascular    Abdominal Aorta: no palpable masses, pulsations, or audible bruits.      Femoral Pulses: normal femoral pulses bilaterally.      Pedal Pulses: normal pedal pulses bilaterally.      Radial Pulses: normal radial pulses bilaterally.      Peripheral Circulation: no clubbing, cyanosis, or edema noted with normal capillary refill.     EKG  Procedure date:  03/06/2010  Findings:      Sinus rhythm, rate 92, axis within normal limits, intervals within normal limits, no acute ST-T wave changes.  Impression & Recommendations:  Problem # 1:  TACHYCARDIA (ICD-785.0) The patient has a mild tachycardia. I will evaluate with an  echocardiogram given the slight systolic murmur. Also give her Inderal as needed. as she has anxiety related to this tachycardia which I believe is sinus tachycardia. I will also order a TSH if these have not been done.  Problem # 2:  MURMUR (ICD-785.2)  This will be evaluated as above.  Orders: 2-D Echocardiogram (2D Echo)  Her updated medication list for this problem includes:    Propranolol Hcl 10 Mg Tabs (Propranolol hcl) .Marland Kitchen... Take one tablet by mouth three times a day  Problem # 3:  SHORTNESS OF BREATH (ICD-786.05) I Ido not suspect a cardiac etiology. No further workup is suggested at this time.  Patient Instructions: 1)  Your physician has requested that you have an echocardiogram.  Echocardiography is a painless test that uses sound waves to create images of your heart. It provides your doctor with information about the size and shape of your heart and  how well your heart's chambers and valves are working.  This procedure takes approximately one hour. There are no restrictions for this procedure. If the results of your test are normal or stable, you will receive a letter. If they are abnormal, the nurse will contact you by phone.  2)  Start Propranolol 10mg  by mouth three times a day. 3)  Your physician wants you to follow-up in: 3 months. You will receive a reminder letter in the mail one-two months in advance. If you don't receive a letter, please call our office to schedule the follow-up appointment. Prescriptions: PROPRANOLOL HCL 10 MG TABS (PROPRANOLOL HCL) Take one tablet by mouth three times a day  #90 x 6   Entered by:   Cyril Loosen, RN, BSN   Authorized by:   Rollene Rotunda, MD, [facility]   Signed by:   Cyril Loosen, RN, BSN on 03/11/2010   Method used:   Electronically to        [facility]* (retail)       [address]       [place], [place]  [address]       Ph: [phone]       Fax: [phone]   RxID:    [id number]  I have reviewed and approved all prescriptions at the time of this visit.Rollene Rotunda, MD, [facility]  March 11, 2010 4:25 PM  Appended Document: Orders Update    Clinical Lists Changes  Orders: Added new Test order of T-TSH [phone]) - Signed

## 2010-09-04 NOTE — Assessment & Plan Note (Signed)
Summary: 3 MONTH FU-PER CHECK OUT VS   Visit Type:  Follow-up Referring Provider:  Dr. Mancel Bale Primary Provider:  Kevin Fenton Davis,MD([facility])  CC:  palpitations.  History of Present Illness: The patient presents for followup of palpitations. Since the last visit she was noted to have some abnormal thyroid studies and she has been started on Synthroid by her primary physician. She did manage with propranolol for her tachyarrhythmia. She hasn't tolerated to full doses start cutting the pills in half and has done better. She has few palpitations now. I did order an echocardiogram which demonstrated no cardiomyopathy and an otherwise normal heart. She denies any chest pressure, neck or arm discomfort. She has no shortness of breath, PND orthopnea.  Preventive Screening-Counseling & Management  Alcohol-Tobacco     Smoking Status: never  Current Medications (verified): 1)  Klor-Con 10 10 Meq  Cr-Tabs (Potassium Chloride) .... Take 2 By Mouth Qd 2)  Omeprazole 40 Mg Cpdr (Omeprazole) .... Take 1 Tablet By Mouth Once A Day 3)  Doxycycline Hyclate 50 Mg Caps (Doxycycline Hyclate) .... Take 1 Tablet By Mouth Two Times A Day 4)  Alprazolam 0.5 Mg Tabs (Alprazolam) .... Take 1/2-1 Tablet By Mouth Three Times A Day As Needed 5)  Metrogel 1 % Gel (Metronidazole) .... Apply Topically Daily 6)  Fish Oil 1000 Mg Caps (Omega-3 Fatty Acids) .... Take 1 Tablet By Mouth Two Times A Day 7)  Caltrate 600+d 600-400 Mg-Unit Tabs (Calcium Carbonate-Vitamin D) .... Take 1 Tablet By Mouth Three Times A Day 8)  Multivitamins  Tabs (Multiple Vitamin) .... Take 1 Tablet By Mouth Two Times A Day 9)  Acidophilus  Caps (Lactobacillus) .... Take 1 Tablet By Mouth Once A Day 10)  Magnesium Oxide 250 Mg Tabs (Magnesium Oxide) .... Take 1 Tablet By Mouth Once A Day 11)  Nutrifac Zx  Tabs (Multiple Vitamins-Minerals) .... Take 1 Tablet By Mouth Once A Day 12)  Ascorbic Acid 500 Mg Tabs (Ascorbic Acid) ....  Take 1 Tablet By Mouth Two Times A Day 13)  Vitamin D3 1000 Unit Caps (Cholecalciferol) .... Take 1 Tablet By Mouth Two Times A Day 14)  Loratadine 10 Mg Tabs (Loratadine) .... Take 1 Tablet By Mouth Once A Day 15)  Propranolol Hcl 10 Mg Tabs (Propranolol Hcl) .... Take One-Half Tablet By Mouth Three Times A Day 16)  Levothyroxine Sodium 75 Mcg Tabs (Levothyroxine Sodium) .... Take 1 Tablet By Mouth Once A Day  Allergies (verified): 1)  ! Macrobid 2)  ! Bactrim 3)  ! Reglan 4)  ! * Venofer Iv Iron 5)  ! Pepcid 6)  ! * Clonazepam  Comments:  Nurse/Medical Assistant: The patient's medications and allergies were verbally reviewed with the patient and were updated in the Medication and Allergy Lists.  Past History:  Past Medical History: Reviewed history from 03/11/2010 and no changes required. Kidney Stones 2009 Chronic anemia 2nd to GI blood loss and iron deficiency Gram - sepsis and septic shock syndrome 11/2006 Acute renal failure 2nd to obstruction and septic shock Left forearm fracture  Past Surgical History: Reviewed history from 03/11/2010 and no changes required. Cholecystectomy Tubal Ligation Gastrectomy for treatment of PUD  Review of Systems       As stated in the HPI and negative for all other systems.   Vital Signs:  Patient profile:   60 year old female Height:      65 inches Weight:      98 pounds Pulse rate:  87 / minute BP sitting:   115 / 74  (left arm) Cuff size:   regular  Vitals Entered By: Carlye Grippe (May 19, 2010 4:02 PM)  Physical Exam  General:  Well developed, well nourished, in no acute distress. Head:  normocephalic and atraumatic Eyes:  PERRLA/EOM intact; conjunctiva and lids normal. Mouth:  Teeth, gums and palate normal. Oral mucosa normal. Neck:  Neck supple, no JVD. No masses, thyromegaly or abnormal cervical nodes. Chest Wall:  no deformities or breast masses noted Lungs:  Clear bilaterally to auscultation and  percussion. Abdomen:  Bowel sounds positive; abdomen soft and non-tender without masses, organomegaly, or hernias noted. No hepatosplenomegaly. Msk:  Back normal, normal gait. Muscle strength and tone normal. Extremities:  No clubbing or cyanosis. Neurologic:  Alert and oriented x 3. Skin:  Intact without lesions or rashes. Cervical Nodes:  no significant adenopathy Psych:  Normal affect.   Detailed Cardiovascular Exam  Neck    Carotids: Carotids full and equal bilaterally without bruits.      Neck Veins: Normal, no JVD.    Heart    Inspection: no deformities or lifts noted.      Palpation: normal PMI with no thrills palpable.      Auscultation: regular rate and rhythm, S1, S2 , no murmurr, no diastolic murmurs  Vascular    Abdominal Aorta: no palpable masses, pulsations, or audible bruits.      Femoral Pulses: normal femoral pulses bilaterally.      Pedal Pulses: normal pedal pulses bilaterally.      Radial Pulses: normal radial pulses bilaterally.      Peripheral Circulation: no clubbing, cyanosis, or edema noted with normal capillary refill.     Impression & Recommendations:  Problem # 1:  TACHYCARDIA (ICD-785.0) This is improved with propranolol. She seems to tolerate this. If she has any symptoms in the future I told her we could switch to a different beta blocker. For now she will continue with her current therapy.  Problem # 2:  MURMUR (ICD-785.2) There was no evidence of cardiomyopathy or valvular heart disease. I don't appreciate a murmur today. This may have been a flow murmur.  Problem # 3:  SHORTNESS OF BREATH (ICD-786.05) She currently denies any dyspnea and we'll continue the meds as listed.  Patient Instructions: 1)  Your physician wants you to follow-up in: 6 months. You will receive a reminder letter in the mail one-two months in advance. If you don't receive a letter, please call our office to schedule the follow-up appointment. 2)  Your physician  recommends that you continue on your current medications as directed. Please refer to the Current Medication list given to you today.

## 2010-09-04 NOTE — Letter (Signed)
[facility]   Imported By: Sherian Rein 09/25/2009 10:55:22  _____________________________________________________________________  External Attachment:    Type:   Image     Comment:   External Document

## 2010-09-04 NOTE — Letter (Signed)
Summary: [facility]  Internal Correspondence/ [facility]   Imported By: Dorise Hiss 03/07/2010 08:47:44  _____________________________________________________________________  External Attachment:    Type:   Image     Comment:   External Document

## 2010-09-11 ENCOUNTER — Other Ambulatory Visit: Payer: Self-pay | Admitting: Oncology

## 2010-09-11 ENCOUNTER — Encounter (HOSPITAL_BASED_OUTPATIENT_CLINIC_OR_DEPARTMENT_OTHER): Payer: Medicare HMO | Admitting: Oncology

## 2010-09-11 DIAGNOSIS — D509 Iron deficiency anemia, unspecified: Secondary | ICD-10-CM

## 2010-09-11 DIAGNOSIS — K922 Gastrointestinal hemorrhage, unspecified: Secondary | ICD-10-CM

## 2010-09-11 DIAGNOSIS — D500 Iron deficiency anemia secondary to blood loss (chronic): Secondary | ICD-10-CM

## 2010-09-11 LAB — CBC WITH DIFFERENTIAL/PLATELET
BASO%: 0.6 % (ref 0.0–2.0)
Eosinophils Absolute: 0.1 10*3/uL (ref 0.0–0.5)
LYMPH%: 24.8 % (ref 14.0–49.7)
MCHC: 33.8 g/dL (ref 31.5–36.0)
MCV: 99.4 fL (ref 79.5–101.0)
MONO%: 10.4 % (ref 0.0–14.0)
Platelets: 284 10*3/uL (ref 145–400)
RBC: 3.56 10*6/uL — ABNORMAL LOW (ref 3.70–5.45)

## 2010-09-11 LAB — BASIC METABOLIC PANEL
Calcium: 8.5 mg/dL (ref 8.4–10.5)
Glucose, Bld: 133 mg/dL — ABNORMAL HIGH (ref 70–99)
Sodium: 143 mEq/L (ref 135–145)

## 2010-09-11 LAB — FERRITIN: Ferritin: 32 ng/mL (ref 10–291)

## 2010-09-29 ENCOUNTER — Encounter (HOSPITAL_BASED_OUTPATIENT_CLINIC_OR_DEPARTMENT_OTHER): Payer: Medicare HMO | Admitting: Oncology

## 2010-09-29 DIAGNOSIS — K922 Gastrointestinal hemorrhage, unspecified: Secondary | ICD-10-CM

## 2010-09-29 DIAGNOSIS — D509 Iron deficiency anemia, unspecified: Secondary | ICD-10-CM

## 2010-09-29 DIAGNOSIS — D500 Iron deficiency anemia secondary to blood loss (chronic): Secondary | ICD-10-CM

## 2010-10-02 ENCOUNTER — Encounter (HOSPITAL_BASED_OUTPATIENT_CLINIC_OR_DEPARTMENT_OTHER): Payer: Medicare HMO | Admitting: Oncology

## 2010-10-02 DIAGNOSIS — D509 Iron deficiency anemia, unspecified: Secondary | ICD-10-CM

## 2010-12-16 NOTE — Discharge Summary (Signed)
NAME:  Brittany Hensley, Brittany Hensley               ACCOUNT NO.:  [id number]   MEDICAL RECORD NO.:  [id number]          PATIENT TYPE:  INP   LOCATION:  1440                         FACILITY:  [facility]   PHYSICIAN:  Lonia Blood, M.D.       DATE OF BIRTH:  02-24-1951   DATE OF ADMISSION:  01/04/2007  DATE OF DISCHARGE:  01/06/2007                               DISCHARGE SUMMARY   DISCHARGE DIAGNOSES:  1. Hypotension and sepsis, resolved.  2. Klebsiella urinary tract infection.  3. Fall secondary to hypotension..  4. Renal tubular acidosis type 2.  5. Relative adrenal insufficiency.  6. History of migraines.  7. Chronic anemia.  8. History of multiple gastric ulcers.  9. Hypokalemia, resolved.  10.Syncope prior to admission, no recurrence in the hospital.   DISCHARGE MEDICATIONS:  1. Hydrocortisone 10 mg twice a day.  2. Urocit-K 1080 mg twice a day,  3. Cipro 500 mg twice a day for 10 days.  4. Protonix 40 mg daily.  5. Milk daily.   CONDITION ON DISCHARGE:  [name] was discharged in good condition.  At the time of her discharge the patient was afebrile and with stable  vital signs.   PROCEDURES:  No procedures were done.   CONSULTATIONS:  No consultations were obtained.   HISTORY AND PHYSICAL:  For admission history and physical, refer to the  dictated H&P done by Ellie Lunch, M.D.,  January 04, 2007.   HOSPITAL COURSE:  Problem 1.  SEPSIS, HYPOTENSION:  [name] was admitted with a  recurrent episode of hypotension and sepsis.  The cause of this episode  was felt to be a urinary tract infection and relative adrenal  insufficiency.  With generous intravenous fluids, antibiotics and  steroids, the patient's vital signs stabilized quickly.  It also became  apparent that [name] will probably require some adrenal supportive  treatment every time she has an infectious process.  The urine culture  grew Klebsiella which was sensitive to the ciprofloxacin, and the  patient will complete a  2-week treatment of on this antibiotic.   Problem 2.  ACIDOSIS:  [name] has a has a non-anion gap acidosis,  which in the setting of renal stones and hypokalemia indicates the  possibility of a renal tubular acidosis of an RTA type 2.  This requires  treatment with Urocit-K on a daily basis.  The patient was prescribed  this medication.  She was instructed follow up with her primary care  physician for refills on the medication.   Problem 3.  CHRONIC ANEMIA:  Ms. Crilly did not have any bleeding during  this admission.  She was instructed follow up with her primary  hematologist for further treatment.      Lonia Blood, M.D.  Electronically Signed     SL/MEDQ  D:  01/09/2007  T:  01/10/2007  Job:  [id number]   cc:   Leighton Roach. Truett Perna, M.D.  Fax: [phone]   [facility]

## 2010-12-16 NOTE — H&P (Signed)
NAME:  Brittany Hensley, Brittany Hensley               ACCOUNT NO.:  [id number]   MEDICAL RECORD NO.:  [id number]          PATIENT TYPE:  INP   LOCATION:  0108                         FACILITY:  [facility]   PHYSICIAN:  Ellie Lunch, M.D.      DATE OF BIRTH:  August 02, 1951   DATE OF ADMISSION:  01/04/2007  DATE OF DISCHARGE:                              HISTORY & PHYSICAL   REASON FOR ADMISSION:  Orthostatic hypotension, nausea and a couple  episodes of vomiting.   HISTORY OF PRESENT ILLNESS:  Brittany Hensley is a very pleasant 60 year old  white female with a recent hospital admission in April of 2008 that was  significant for septic shock secondary to pyelonephritis, as well as  ureteric stones who came to the emergency room today because of 2  episodes of dizziness after she got up and walked to the bathroom  associated with some injury to the scalp.  Brittany Hensley has not been  feeling very well after she was discharged on November 19, 2006; however,  she has noticed profound weakness 1 week prior to admission.  She also  noted that she experiences a lot of nausea with seldom any vomiting.  The last vomiting occurred about 3 days prior to admission.  She denies  any abdominal pain or diarrhea along with these episodes.  She also  denies fevers, chills, cough, sputum production.  She denies dysuria,  nocturia or polyuria.  She has been on Bactrim.  However, she stopped  taking it about 3 days prior to admission because she thought the  Bactrim was causing her nausea; however, the nausea persisted despite  discontinuing the Bactrim.   PAST MEDICAL HISTORY:  1. Septic shock secondary to pyelonephritis in April of 2008.  2. History of kidney stones being followed by Dr. Annabell Howells.  Note, the      patient has already had removal of kidney stones on the right;      however, has one on the left which is currently being monitored by      Dr. Annabell Howells.  3. History of multiple gastric ulcers secondary to NSAID use secondary  to migraines.  4. History of migraines.  5. History of AVM noted in the small intestine status post resection      at [facility].   CURRENT MEDICATIONS:  1. Multivitamin daily.  2. Note the patient stopped taking Bactrim 3 days prior to admission.      She was supposed to be on Bactrim DS p.o. b.i.d.  3. Also note the patient was on propranolol 40 mg p.o. b.i.d. but has      stopped taking it about 3 days prior to admission secondary to low      blood pressure.   ALLERGIES:  1. ASPIRIN - the patient has been told to avoid because of history of      gastric ulcers.  2. FENTANYL causes vomiting.  3. MACROBID causes nausea and vomiting.  4. REGLAN causes her hallucinations.   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  There  is no history of smoking,  drinking or drug use.   REVIEW OF SYSTEMS:  In addition to the above symptoms, the patient also  complains of a macular, pruritic rash noted on both lower extremities  between the ankle and the knees that she recently noticed about 3 days  ago.  It is nonpleuritic and nonpainful.   PHYSICAL EXAMINATION:  VITAL SIGNS:  Temperature 98.2, blood pressure  93/70, pulse 105, respirations 16, O2 sats 100% on room air.  GENERAL:  The patient is in no acute distress.  HEENT:  Pupils equal, round, reactive to light and accommodation.  There  is no pallor.  Oropharynx is clear.  On the scalp, the patient does have  an injury behind the left ear, as well as with a scab with some  bruising.  NECK:  Neck is supple.  There is no JVD.  CARDIOVASCULAR:  Tachycardia with regular rate.  No murmurs.  CHEST:  Clear to auscultation bilaterally.  No rales, rhonchi or  wheezing.  ABDOMEN:  Completely soft.  There is no tenderness to palpation in any  of the quadrants.  There is no costovertebral tenderness, as well.  The  patient has bowel sounds.  EXTREMITIES:  There is no clubbing or cyanosis.  The  patient does have a  macular, almost pruritic rash that extends from the ankles to the knees  bilaterally.  There is no presence of rash on the foot or the hands.   LABORATORY DATA:  Hemoglobin is 13, white count 6.8, platelets 318.  Sodium 137, potassium 2.6, chloride 110, bicarbonate 16, glucose 92, BUN  12, creatinine 1.2, calcium 8.7.  UA was negative except for 3-6 WBCs.   PLAN:  1. A fall secondary to orthostatic hypotension coupled with nausea and      weakness.  Given her current symptoms and completely normal      abdominal exam with no abdominal findings, I am concerned about      adrenal insufficiency, especially since the patient told me she was      given a lot of steroids in April for her hypotension.  We will go      ahead and get a cortisol level, and if it is low, will confirm it      by doing a cosyntropin test.  Will treat her symptomatically right      with Zofran.  Will continue the IV fluids; however, the patient's      weakness is out of proportion to the degree of hypovolemia.  She      appears to be very euvolemic on exam, and, hence, I am even more      concerned about adrenal insufficiency.  Also, given the history of      pyelonephritis and her left kidney stone, will also treat her with      IV Rocephin and also perform a urine culture to cover a second      possible attack of pyelonephritis.  Also, will go ahead and check      liver function panel as well as a lipase level.  Note, the patient      has already had a cholecystectomy in the past.  2. Hypokalemia.  Again, this is a little surprising, given the fact      that the patient has not had many episodes of vomiting.  Will go      ahead and replete her potassium.  Will also check a magnesium  level.  3. Non-gap metabolic acidosis.  Will hydrate the patient first and      then repeat a BMET again in the morning.      Ellie Lunch, M.D.  Electronically Signed    BP/MEDQ  D:  01/04/2007  T:   01/04/2007  Job:  [id number]

## 2010-12-16 NOTE — Consult Note (Signed)
[facility]                          ENDOCRINOLOGY CONSULTATION   NAME:Brittany Hensley, Brittany Hensley                      MRN:          [id number]  DATE:02/14/2007                            DOB:          10-Jul-1951    REFERRING PHYSICIAN:  Dr. Earlene Plater   REASON FOR VISIT:  Question of adrenal insufficiency.   HISTORY OF PRESENT ILLNESS:  A 59 year old woman who was hospitalized in  April 2008, due to urosepsis, apparently arising from urolithiasis.  Since then, she reports several episodes of syncope or presyncope.  She  was treated with a high dose of hydrocortisone for 4 days.  She got  severe swelling in her feet for a few days without associated shortness  of breath.  She reduced this to 2.5 mg of hydrocortisone twice a day and  she states she now feels fine.   PAST MEDICAL HISTORY:  Anemia for which she sees Dr. Truett Perna.  Otherwise, healthy.   SOCIAL HISTORY:  She is disabled.  She is widowed.  She states, God  wants me to take this dosage of medication.   FAMILY HISTORY:  Negative for adrenal disease.   REVIEW OF SYSTEMS:  She states she has lost weight over the past few  months but does not know how much.  Denies chest pain.   PHYSICAL EXAMINATION:  VITAL SIGNS:  Blood pressure 106/68, heart rate  89, temperature is 98.1, the weight is 99 pounds.  GENERAL:  Lean woman in no distress.  SKIN:  Pale.  It is not diaphoretic.  There is no rash.  HEENT:  No proptosis.  No periorbital swelling.  NECK:  No goiter.  CHEST:  Clear to auscultation.  No respiratory distress.  CARDIOVASCULAR:  No edema.  Regular rate and rhythm.  No murmur.  Pedal  pulses are intact.  ABDOMEN:  Soft, nontender.  No hepatosplenomegaly.  No mass.  NEUROLOGIC:  Alert, oriented, does not appear anxious nor depressed and  there is no tremor.   LABORATORY STUDIES:  January 15, 2007, WBC 3,300, hemoglobin 11.1,  platelets 249.  Calcium 8.7.  Albumin 2.9.  She had an apparently random  cortisol of 9.9 mcg/dL.  In our office on February 14, 2007, parathyroid  hormone is 18 and calcium is 8.5.   IMPRESSION:  1. History of urosepsis, apparently brought about by urolithiasis.  2. Syncope or presyncope of uncertain etiology.  3. Anemia.  4. Mild hypocalcemia is noted.  5. Given the low normal parathyroid hormone level, this low calcium      should be deemed due to her low albumin.   PLAN:  1. I discussed with her the fact that it is very important that      adrenal insufficiency be either diagnosed or excluded.  I explained      to her that this is because if she has adrenal insufficiency, her      hydrocortisone dosage is insufficient.  I further explained that if      she does not have adrenal insufficiency, this medication is      unnecessary and carries significant side effects.  Despite my      advise, she wishes to refuse the ACTH stimulation test but wants to      keep open the option of doing it in the future.  I told her I am      happy to do this if she agrees in the future.  2. Return here p.r.n.     Sean A. Everardo All, MD  Electronically Signed    SAE/MedQ  DD: 02/20/2007  DT: 02/20/2007  Job #: [id number]   cc:   Louanna Raw

## 2010-12-19 NOTE — Op Note (Signed)
NAME:  Brittany Hensley, Brittany Hensley               ACCOUNT NO.:  [id number]   MEDICAL RECORD NO.:  [id number]          PATIENT TYPE:  INP   LOCATION:  1406                         FACILITY:  [facility]   PHYSICIAN:  Excell Seltzer. Annabell Howells, M.D.    DATE OF BIRTH:  12-Apr-1951   DATE OF PROCEDURE:  11/16/2006  DATE OF DISCHARGE:                               OPERATIVE REPORT   PREOPERATIVE DIAGNOSIS:  Right ureteral stone.   POSTOPERATIVE DIAGNOSIS:  Passed right ureteral stone, distal left  ureteral stone.   PROCEDURE PERFORMED:  1. Cystoscopy.  2. Extraction of a bladder stones.  3. Bilateral retrograde pyelogram with interpretation.  4. Right ureteroscopy.   SURGEON:  Excell Seltzer. Annabell Howells, M.D.   RESIDENT:  Terie Purser, MD   ANESTHESIA:  General.   DRAIN:  16-French Foley catheter.   COMPLICATIONS:  None.   DISPOSITION:  Stable to post anesthesia care unit.   INDICATIONS FOR PROCEDURE:  [name] is a 60 year old female with a  history of kidney stones.  She was admitted on April 5 with urosepsis  and was found to have a distal right ureteral stone.  She has undergone  percutaneous nephrostomy placement.  She has improved clinically and is  now for ureteroscopic stone manipulation.  The benefits, risks of  procedure were explained and consent was obtained.   DESCRIPTION OF PROCEDURE:  The patient was brought to the operating room  was properly identified.  Time-out performed to confirm correct patient  procedure and side.  She was administered general anesthesia, given  preoperative antibiotic and placed in the dorsal lithotomy position and  prepped and draped in sterile fashion.  Cystoscopy was performed using a  12 and 70 degrees lens.  Upon entering the bladder, there were several  small stone fragments noted in the base of the bladder.  There was also  a small stone seen emanating from the left ureteral orifice.  This stone  was able to be manipulated out of the left orifice and was removed.  The  remaining bladder stones were irrigated free.   We then systematically examined the bladder.  There was no other  evidence of foreign body stone or tumor noted.   Bilateral retrograde pyelography was then performed using a 5-French end-  hole catheter.  We first looked in the right ureter.  There was no  evidence of a stone on the retrograde study.  The proximal right ureter  was mildly dilated above the vessels but did drain dynamically.  Renal  pelvis demonstrated the existing nephrostomy tube as well as the known  horizontal lie of the right kidney.  We then turned our attention to the  left ureter as this had had a evidence of a stone noted.  A 5-French end  hole catheter was inserted and retrograde pyelogram was performed.  During this appeared normal with no evidence of a stone or  hydronephrosis.   We then placed a semi-rigid ureteroscope in the right ureter to confirm  the findings on retrograde.  There was no evidence of stone noted.  There did appear to be a small  plaque on the distal ureter just inside  the ureteral orifice which appeared to be a stone fragment.  The  ureteroscope was then removed and the procedure was terminated.  A Foley  catheter was placed in the bladder.  The patient was awoken from  anesthesia and transported to room in stable condition.  There were no  complications.  Please note Dr. Annabell Howells was present and participated in  all aspects this procedures as primary surgeon.     ______________________________  Terie Purser, MD      Excell Seltzer. Annabell Howells, M.D.  Electronically Signed    JH/MEDQ  D:  11/16/2006  T:  11/16/2006  Job:  [id number]

## 2010-12-19 NOTE — Consult Note (Signed)
NAME:  Brittany Hensley, Brittany Hensley               ACCOUNT NO.:  [id number]   MEDICAL RECORD NO.:  [id number]          PATIENT TYPE:  INP   LOCATION:  1406                         [facility]ACILITY:  [facility]   PHYSICIAN:  Antonietta Breach, M.D.  DATE OF BIRTH:  [birth year]   DATE OF CONSULTATION:  11/16/2006  DATE OF DISCHARGE:                                 CONSULTATION   Ms. Forstrom is feeling much better.  Her feeling on edge has diminished  80%.  She is completely oriented and alert.  Her memory and  concentration are within normal limits.  She has not experienced any  adverse Ativan effects.  She has not required any Xanax.  She only  received 1 mg of Ativan in the past 24 hours.   SGOT 19, SGPT 16.  Albumin 1.5.  Calcium 7.7.  Magnesium 1.5.   PHYSICAL EXAMINATION:  VITAL SIGNS:  Temperature 97.9, pulse 100,  respiration 20, blood pressure 130/83, O2 saturation on 2 liters 97%.   MENTAL STATUS EXAM:  Brittany Hensley is alert.  She is oriented to all  spheres.  Her speech is within normal limits.  Her thought process is  logical, coherent, goal-directed.  No looseness of associations.  Thought content:  No thoughts of harming herself, no thoughts of harming  others, no delusions, no hallucinations.  Affect is slightly anxious at  baseline but with a broad appropriate response.  Her insight and  judgment are within normal limits.  Her eye contact is good.   ASSESSMENT:  293.84 anxiety disorder due to general medical condition  stable.   RECOMMENDATIONS:  It is anticipated that the patient will not need  Ativan when she leaves the hospital based upon her diminished use and  the improvement of her medical condition.  She does not require any  follow-up psychiatric care or ongoing psychotropic medications.  The  undersigned will sign off at this point.  Please call psychiatry if any  further need arises.      Antonietta Breach, M.D.  Electronically Signed     JW/MEDQ  D:  11/17/2006  T:  11/18/2006   Job:  [id number]

## 2011-01-01 ENCOUNTER — Other Ambulatory Visit: Payer: Self-pay | Admitting: Oncology

## 2011-01-01 ENCOUNTER — Encounter (HOSPITAL_BASED_OUTPATIENT_CLINIC_OR_DEPARTMENT_OTHER): Payer: Medicare HMO | Admitting: Oncology

## 2011-01-01 DIAGNOSIS — D500 Iron deficiency anemia secondary to blood loss (chronic): Secondary | ICD-10-CM

## 2011-01-01 LAB — CBC WITH DIFFERENTIAL/PLATELET
Eosinophils Absolute: 0.2 10*3/uL (ref 0.0–0.5)
MONO#: 0.4 10*3/uL (ref 0.1–0.9)
NEUT#: 2.8 10*3/uL (ref 1.5–6.5)
RBC: 3.42 10*6/uL — ABNORMAL LOW (ref 3.70–5.45)
RDW: 14.7 % — ABNORMAL HIGH (ref 11.2–14.5)
WBC: 4.9 10*3/uL (ref 3.9–10.3)
lymph#: 1.5 10*3/uL (ref 0.9–3.3)

## 2011-01-01 LAB — FERRITIN: Ferritin: 106 ng/mL (ref 10–291)

## 2011-04-02 ENCOUNTER — Other Ambulatory Visit: Payer: Self-pay | Admitting: Oncology

## 2011-04-02 ENCOUNTER — Encounter (HOSPITAL_BASED_OUTPATIENT_CLINIC_OR_DEPARTMENT_OTHER): Payer: Medicare HMO | Admitting: Oncology

## 2011-04-02 DIAGNOSIS — D500 Iron deficiency anemia secondary to blood loss (chronic): Secondary | ICD-10-CM

## 2011-04-02 DIAGNOSIS — D509 Iron deficiency anemia, unspecified: Secondary | ICD-10-CM

## 2011-04-02 DIAGNOSIS — K922 Gastrointestinal hemorrhage, unspecified: Secondary | ICD-10-CM

## 2011-04-02 LAB — CBC WITH DIFFERENTIAL/PLATELET
Basophils Absolute: 0 10*3/uL (ref 0.0–0.1)
Eosinophils Absolute: 0.1 10*3/uL (ref 0.0–0.5)
HGB: 12.8 g/dL (ref 11.6–15.9)
MCV: 99.5 fL (ref 79.5–101.0)
MONO#: 0.5 10*3/uL (ref 0.1–0.9)
MONO%: 8.1 % (ref 0.0–14.0)
NEUT#: 4 10*3/uL (ref 1.5–6.5)
RDW: 14.1 % (ref 11.2–14.5)

## 2011-04-02 LAB — BASIC METABOLIC PANEL
BUN: 13 mg/dL (ref 6–23)
Calcium: 8.3 mg/dL — ABNORMAL LOW (ref 8.4–10.5)
Creatinine, Ser: 1.12 mg/dL — ABNORMAL HIGH (ref 0.50–1.10)
Glucose, Bld: 86 mg/dL (ref 70–99)

## 2011-04-02 LAB — TSH: TSH: 3.582 u[IU]/mL (ref 0.350–4.500)

## 2011-05-15 LAB — CBC
MCHC: 33.1
MCV: 100.4 — ABNORMAL HIGH
Platelets: 298
RBC: 3.48 — ABNORMAL LOW

## 2011-05-15 LAB — DIFFERENTIAL
Basophils Relative: 1
Eosinophils Absolute: 0.1
Monocytes Relative: 9
Neutrophils Relative %: 63

## 2011-05-15 LAB — BASIC METABOLIC PANEL
BUN: 11
Calcium: 9.2
Creatinine, Ser: 0.99
GFR calc Af Amer: >60
GFR calc non Af Amer: 58 — ABNORMAL LOW

## 2011-05-18 LAB — BASIC METABOLIC PANEL
BUN: 11
CO2: 23
Calcium: 9.3
Creatinine, Ser: 1.05
GFR calc Af Amer: >60
Glucose, Bld: 133 — ABNORMAL HIGH

## 2011-05-18 LAB — URINALYSIS, ROUTINE W REFLEX MICROSCOPIC
Hgb urine dipstick: NEGATIVE
Protein, ur: NEGATIVE
Urobilinogen, UA: 0.2

## 2011-05-18 LAB — CBC
MCHC: 34.2
Platelets: 313
RBC: 3.42 — ABNORMAL LOW
RDW: 12.9

## 2011-05-18 LAB — DIFFERENTIAL
Basophils Absolute: 0
Basophils Relative: 1
Neutro Abs: 2.9
Neutrophils Relative %: 60

## 2011-05-21 ENCOUNTER — Encounter (HOSPITAL_BASED_OUTPATIENT_CLINIC_OR_DEPARTMENT_OTHER): Payer: Medicare HMO | Admitting: Oncology

## 2011-05-21 ENCOUNTER — Other Ambulatory Visit: Payer: Self-pay | Admitting: Oncology

## 2011-05-21 DIAGNOSIS — K922 Gastrointestinal hemorrhage, unspecified: Secondary | ICD-10-CM

## 2011-05-21 DIAGNOSIS — D509 Iron deficiency anemia, unspecified: Secondary | ICD-10-CM

## 2011-05-21 DIAGNOSIS — D500 Iron deficiency anemia secondary to blood loss (chronic): Secondary | ICD-10-CM

## 2011-05-21 LAB — BASIC METABOLIC PANEL
BUN: 12
BUN: 9
CO2: 16 — ABNORMAL LOW
CO2: 17 — ABNORMAL LOW
Calcium: 8.7
Chloride: 122 — ABNORMAL HIGH
Chloride: 126 — ABNORMAL HIGH
Creatinine, Ser: 0.8
GFR calc Af Amer: 58 — ABNORMAL LOW
GFR calc Af Amer: >60
GFR calc non Af Amer: 46 — ABNORMAL LOW
GFR calc non Af Amer: 48 — ABNORMAL LOW
Glucose, Bld: 92
Potassium: 2.6 — CL
Potassium: 3.3 — ABNORMAL LOW
Sodium: 144
Sodium: 147 — ABNORMAL HIGH

## 2011-05-21 LAB — DIFFERENTIAL
Basophils Absolute: 0
Basophils Absolute: 0
Basophils Relative: 0
Basophils Relative: 1
Eosinophils Absolute: 0.1
Eosinophils Relative: 1
Eosinophils Relative: 2
Lymphocytes Relative: 16
Monocytes Absolute: 0.5
Neutrophils Relative %: 55

## 2011-05-21 LAB — PHOSPHORUS: Phosphorus: 2 — ABNORMAL LOW

## 2011-05-21 LAB — URINE MICROSCOPIC-ADD ON

## 2011-05-21 LAB — COMPREHENSIVE METABOLIC PANEL
ALT: 12
AST: 17
CO2: 25
Chloride: 109
GFR calc Af Amer: >60
GFR calc non Af Amer: 52 — ABNORMAL LOW
Glucose, Bld: 92
Sodium: 139
Total Bilirubin: 0.6

## 2011-05-21 LAB — CBC WITH DIFFERENTIAL/PLATELET
BASO%: 0.4 % (ref 0.0–2.0)
EOS%: 1.4 % (ref 0.0–7.0)
MCH: 33.4 pg (ref 25.1–34.0)
MCHC: 33.9 g/dL (ref 31.5–36.0)
RDW: 13.9 % (ref 11.2–14.5)
lymph#: 1.4 10*3/uL (ref 0.9–3.3)

## 2011-05-21 LAB — CULTURE, BLOOD (ROUTINE X 2): Culture: NO GROWTH

## 2011-05-21 LAB — URINALYSIS, ROUTINE W REFLEX MICROSCOPIC
Nitrite: NEGATIVE
Protein, ur: 100 — AB
Specific Gravity, Urine: 1.024
Urobilinogen, UA: 0.2

## 2011-05-21 LAB — CBC
HCT: 38.9
Hemoglobin: 11.1 — ABNORMAL LOW
MCHC: 33.6
MCV: 106.9 — ABNORMAL HIGH
Platelets: 318
RBC: 3.19 — ABNORMAL LOW
RDW: 21 — ABNORMAL HIGH
WBC: 3.3 — ABNORMAL LOW

## 2011-05-21 LAB — HEPATIC FUNCTION PANEL
ALT: 13
AST: 20
Albumin: 2.8 — ABNORMAL LOW
Alkaline Phosphatase: 44
Bilirubin, Direct: <0.1
Total Bilirubin: 0.4

## 2011-05-21 LAB — CORTISOL: Cortisol, Plasma: 9.9

## 2011-05-21 LAB — URINE CULTURE: Colony Count: >=100000

## 2011-05-21 LAB — POCT CARDIAC MARKERS
Operator id: 4001
Troponin i, poc: <0.05

## 2011-06-01 ENCOUNTER — Other Ambulatory Visit: Payer: Self-pay | Admitting: Gastroenterology

## 2011-06-01 DIAGNOSIS — D500 Iron deficiency anemia secondary to blood loss (chronic): Secondary | ICD-10-CM

## 2011-06-05 ENCOUNTER — Ambulatory Visit
Admission: RE | Admit: 2011-06-05 | Discharge: 2011-06-05 | Disposition: A | Discharge status: Home / Self Care | Payer: Medicare HMO | Source: Ambulatory Visit | Attending: Gastroenterology | Admitting: Gastroenterology

## 2011-06-05 DIAGNOSIS — D500 Iron deficiency anemia secondary to blood loss (chronic): Secondary | ICD-10-CM

## 2011-06-05 MED ORDER — IOHEXOL 300 MG/ML  SOLN
100.0000 mL | Freq: Once | INTRAMUSCULAR | Status: AC | PRN
  Administered 2011-06-05: 100 mL via INTRAVENOUS

## 2011-06-16 ENCOUNTER — Encounter (HOSPITAL_COMMUNITY): Payer: Self-pay | Admitting: *Deleted

## 2011-06-16 ENCOUNTER — Encounter (HOSPITAL_COMMUNITY)
Admission: RE | Disposition: A | Discharge status: Home / Self Care | Payer: Self-pay | Source: Ambulatory Visit | Attending: Gastroenterology

## 2011-06-16 ENCOUNTER — Ambulatory Visit (HOSPITAL_COMMUNITY)
Admission: RE | Admit: 2011-06-16 | Discharge: 2011-06-16 | Disposition: A | Discharge status: Home / Self Care | Payer: Medicare HMO | Source: Ambulatory Visit | Attending: Gastroenterology | Admitting: Gastroenterology

## 2011-06-16 DIAGNOSIS — K311 Adult hypertrophic pyloric stenosis: Secondary | ICD-10-CM | POA: Insufficient documentation

## 2011-06-16 HISTORY — DX: Anemia, unspecified: D64.9

## 2011-06-16 HISTORY — DX: Hypothyroidism, unspecified: E03.9

## 2011-06-16 HISTORY — PX: ESOPHAGOGASTRODUODENOSCOPY: SHX5428

## 2011-06-16 HISTORY — PX: BALLOON DILATION: SHX5330

## 2011-06-16 SURGERY — EGD (ESOPHAGOGASTRODUODENOSCOPY)
Anesthesia: Moderate Sedation

## 2011-06-16 MED ORDER — FENTANYL NICU IV SYRINGE 50 MCG/ML
INJECTION | INTRAMUSCULAR | Status: DC | PRN
  Administered 2011-06-16 (×2): 25 ug via INTRAVENOUS

## 2011-06-16 MED ORDER — FENTANYL CITRATE 0.05 MG/ML IJ SOLN
INTRAMUSCULAR | Status: AC
  Filled 2011-06-16: qty 2

## 2011-06-16 MED ORDER — ONDANSETRON HCL 4 MG/2ML IJ SOLN
INTRAMUSCULAR | Status: AC
  Filled 2011-06-16: qty 2

## 2011-06-16 MED ORDER — BUTAMBEN-TETRACAINE-BENZOCAINE 2-2-14 % EX AERO
INHALATION_SPRAY | CUTANEOUS | Status: DC | PRN
  Administered 2011-06-16: 2 via TOPICAL

## 2011-06-16 MED ORDER — DIPHENHYDRAMINE HCL 50 MG/ML IJ SOLN
INTRAMUSCULAR | Status: AC
  Filled 2011-06-16: qty 1

## 2011-06-16 MED ORDER — MIDAZOLAM HCL 10 MG/2ML IJ SOLN
INTRAMUSCULAR | Status: AC
  Filled 2011-06-16: qty 2

## 2011-06-16 MED ORDER — SODIUM CHLORIDE 0.9 % IV SOLN
INTRAVENOUS | Status: DC
  Administered 2011-06-16: 500 mL via INTRAVENOUS

## 2011-06-16 MED ORDER — MIDAZOLAM HCL 10 MG/2ML IJ SOLN
INTRAMUSCULAR | Status: DC | PRN
  Administered 2011-06-16: 2 mg via INTRAVENOUS
  Administered 2011-06-16: 1 mg via INTRAVENOUS
  Administered 2011-06-16: 2 mg via INTRAVENOUS

## 2011-06-16 MED ORDER — ONDANSETRON HCL 4 MG/2ML IJ SOLN
INTRAMUSCULAR | Status: DC | PRN
  Administered 2011-06-16: 4 mg via INTRAVENOUS

## 2011-06-16 NOTE — H&P (Signed)
  Patient well known to me with long-standing GI issues status post 3 gastrectomies now with a month history of increasing abdominal pain and nausea and vomiting with an outpatient endoscopies showing gastric outlet obstruction and anastomotic stricture presents today for repeat endoscopy and possible anastomotic balloon dilatation Please see a scanned office note for other details of her history and physical

## 2011-06-16 NOTE — Interval H&P Note (Signed)
History and Physical Interval Note:   06/16/2011   2:00 PM   Brittany Hensley  has presented today for surgery, with the diagnosis of dys.carm  The various methods of treatment have been discussed with the patient and family. After consideration of risks, benefits and other options for treatment, the patient has consented to  Procedure(s): ESOPHAGOGASTRODUODENOSCOPY (EGD) BALLOON DILATION as a surgical intervention .  The patients' history has been reviewed, patient examined, no change in status, stable for surgery.  I have reviewed the patients' chart and labs.  Questions were answered to the patient's satisfaction.     [facility] E  MD

## 2011-06-17 NOTE — Brief Op Note (Signed)
06/16/2011  7:54 AM  PATIENT:  Brittany Hensley  60 y.o. female  PRE-OPERATIVE DIAGNOSIS:  dys.carm  POST-OPERATIVE DIAGNOSIS:  gastric outlet ostruction  PROCEDURE:  Procedure(s): ESOPHAGOGASTRODUODENOSCOPY (EGD) BALLOON DILATION  SURGEON:  Surgeon(s): Petra Kuba, MD  PHYSICIAN ASSISTANT:   ASSISTANTS: none   ANESTHESIA:   none  EBL:     BLOOD ADMINISTERED:none  DRAINS: none   LOCAL MEDICATIONS USED:  MARCAINE spray  SPECIMEN:  No Specimen  DISPOSITION OF SPECIMEN:  N/A  COUNTS:  YES  TOURNIQUET:  * No tourniquets in log *  DICTATION: .Dragon Dictation  PLAN OF CARE: Admit to inpatient   PATIENT DISPOSITION:  PACU - hemodynamically stable.   Delay start of Pharmacological VTE agent (>24hrs) due to surgical blood loss or risk of bleeding:  {YES/NO/NOT APPLICABLE:20182

## 2011-06-29 ENCOUNTER — Encounter (HOSPITAL_COMMUNITY): Payer: Self-pay | Admitting: Gastroenterology

## 2011-07-01 ENCOUNTER — Other Ambulatory Visit (HOSPITAL_BASED_OUTPATIENT_CLINIC_OR_DEPARTMENT_OTHER): Payer: Medicare HMO | Admitting: Lab

## 2011-07-01 ENCOUNTER — Other Ambulatory Visit: Payer: Self-pay | Admitting: Oncology

## 2011-07-01 DIAGNOSIS — D500 Iron deficiency anemia secondary to blood loss (chronic): Secondary | ICD-10-CM

## 2011-07-01 LAB — CBC WITH DIFFERENTIAL/PLATELET
BASO%: 0.9 % (ref 0.0–2.0)
EOS%: 2.5 % (ref 0.0–7.0)
HCT: 32.7 % — ABNORMAL LOW (ref 34.8–46.6)
LYMPH%: 27 % (ref 14.0–49.7)
MCH: 33.2 pg (ref 25.1–34.0)
MCHC: 33.5 g/dL (ref 31.5–36.0)
NEUT%: 60.6 % (ref 38.4–76.8)
Platelets: 270 10*3/uL (ref 145–400)
RBC: 3.3 10*6/uL — ABNORMAL LOW (ref 3.70–5.45)
WBC: 4.2 10*3/uL (ref 3.9–10.3)

## 2011-07-01 LAB — FERRITIN: Ferritin: 35 ng/mL (ref 10–291)

## 2011-07-02 ENCOUNTER — Telehealth: Payer: Self-pay | Admitting: *Deleted

## 2011-07-02 NOTE — Telephone Encounter (Signed)
Message copied by Caleb Popp on Thu Jul 02, 2011 10:15 AM ------      Message from: Thornton Papas B      Created: Wed Jul 01, 2011  6:34 PM       Please call patient, hb lower, can arrange for IV iron if ferritin is low or she is symptomatic

## 2011-07-02 NOTE — Telephone Encounter (Signed)
Left message on voicemail for pt to call office. Reviewed ferritin with Dr. Truett Perna. OK to arrange for iron infusion.

## 2011-07-07 ENCOUNTER — Telehealth: Payer: Self-pay | Admitting: *Deleted

## 2011-07-07 NOTE — Telephone Encounter (Signed)
Notified family member that her ferritin level did return low and we will arrange for IV iron infusion late this week or next week. He is unaware of any day or time preference for her. Appointment request to scheduler.

## 2011-07-07 NOTE — Telephone Encounter (Signed)
Message copied by Wandalee Ferdinand on Tue Jul 07, 2011  4:43 PM ------      Message from: Thornton Papas B      Created: Wed Jul 01, 2011  6:34 PM       Please call patient, hb lower, can arrange for IV iron if ferritin is low or she is symptomatic

## 2011-07-08 ENCOUNTER — Telehealth: Payer: Self-pay | Admitting: Oncology

## 2011-07-08 NOTE — Telephone Encounter (Signed)
lmonvm advising the pt of her iv iron  appt on 12/08/04/2010@8 :15am

## 2011-07-15 ENCOUNTER — Telehealth: Payer: Self-pay | Admitting: Oncology

## 2011-07-15 ENCOUNTER — Ambulatory Visit (HOSPITAL_BASED_OUTPATIENT_CLINIC_OR_DEPARTMENT_OTHER): Payer: Medicare HMO

## 2011-07-15 ENCOUNTER — Other Ambulatory Visit: Payer: Self-pay | Admitting: Oncology

## 2011-07-15 VITALS — BP 109/74 | HR 75 | Temp 97.9°F

## 2011-07-15 DIAGNOSIS — D509 Iron deficiency anemia, unspecified: Secondary | ICD-10-CM

## 2011-07-15 DIAGNOSIS — D649 Anemia, unspecified: Secondary | ICD-10-CM

## 2011-07-15 DIAGNOSIS — K922 Gastrointestinal hemorrhage, unspecified: Secondary | ICD-10-CM

## 2011-07-15 DIAGNOSIS — D500 Iron deficiency anemia secondary to blood loss (chronic): Secondary | ICD-10-CM

## 2011-07-15 MED ORDER — SODIUM CHLORIDE 0.9 % IV SOLN
Freq: Once | INTRAVENOUS | Status: AC
  Administered 2011-07-15: 09:00:00 via INTRAVENOUS

## 2011-07-15 MED ORDER — DIPHENHYDRAMINE HCL 25 MG PO TABS
25.0000 mg | ORAL_TABLET | Freq: Once | ORAL | Status: AC
  Administered 2011-07-15: 25 mg via ORAL
  Filled 2011-07-15: qty 1

## 2011-07-15 MED ORDER — ACETAMINOPHEN 325 MG PO TABS
650.0000 mg | ORAL_TABLET | Freq: Four times a day (QID) | ORAL | Status: DC | PRN
  Administered 2011-07-15 (×2): 650 mg via ORAL

## 2011-07-15 MED ORDER — SODIUM CHLORIDE 0.9 % IV SOLN
50.0000 mg | Freq: Once | INTRAVENOUS | Status: AC
  Administered 2011-07-15: 50 mg via INTRAVENOUS
  Filled 2011-07-15: qty 1

## 2011-07-15 MED ORDER — IRON DEXTRAN 50 MG/ML IJ SOLN
1400.0000 mg | Freq: Once | INTRAMUSCULAR | Status: AC
  Administered 2011-07-15: 1400 mg via INTRAVENOUS
  Filled 2011-07-15: qty 28

## 2011-07-15 NOTE — Progress Notes (Signed)
VSS after iron test dose. Denies distress.

## 2011-07-15 NOTE — Telephone Encounter (Signed)
gve the pt her feb 2013 appt calendar °

## 2011-09-30 ENCOUNTER — Other Ambulatory Visit: Payer: Self-pay | Admitting: *Deleted

## 2011-09-30 DIAGNOSIS — D649 Anemia, unspecified: Secondary | ICD-10-CM

## 2011-10-01 ENCOUNTER — Ambulatory Visit (HOSPITAL_BASED_OUTPATIENT_CLINIC_OR_DEPARTMENT_OTHER): Payer: Medicare HMO | Admitting: Oncology

## 2011-10-01 ENCOUNTER — Other Ambulatory Visit: Payer: Medicare HMO | Admitting: Lab

## 2011-10-01 VITALS — BP 117/70 | HR 83 | Temp 96.8°F | Ht 66.0 in | Wt 98.5 lb

## 2011-10-01 DIAGNOSIS — D649 Anemia, unspecified: Secondary | ICD-10-CM

## 2011-10-01 LAB — CBC WITH DIFFERENTIAL/PLATELET
BASO%: 1 % (ref 0.0–2.0)
Basophils Absolute: 0.1 10*3/uL (ref 0.0–0.1)
EOS%: 2.8 % (ref 0.0–7.0)
MCH: 31.9 pg (ref 25.1–34.0)
MCHC: 32.9 g/dL (ref 31.5–36.0)
MCV: 97.1 fL (ref 79.5–101.0)
MONO%: 7.6 % (ref 0.0–14.0)
RBC: 3.73 10*6/uL (ref 3.70–5.45)
RDW: 15.6 % — ABNORMAL HIGH (ref 11.2–14.5)
lymph#: 1.4 10*3/uL (ref 0.9–3.3)

## 2011-10-01 LAB — FERRITIN: Ferritin: 112 ng/mL (ref 10–291)

## 2011-10-01 NOTE — Progress Notes (Signed)
OFFICE PROGRESS NOTE   INTERVAL HISTORY:   She returns as schedule. She last received IV iron therapy in December of 2012. She does not feel iron deficient today.  She developed dysphagia last year and reports Dr.Magod performed an esophageal dilatation procedure. Her symptoms improved. She again has mild dysphagia. She will see Dr.Magod within the next few weeks. She denies bleeding.  Objective:  Vital signs in last 24 hours:  Blood pressure 117/70, pulse 83, temperature 96.8 F (36 C), temperature source Oral, height 5\' 6"  (1.676 m), weight 98 lb 8 oz (44.679 kg).    HEENT: Neck without mass Resp: Lungs clear bilaterally Cardio: Regular rate and rhythm GI: Nontender, no mass, no hepatosplenomegaly Vascular: Trace pitting edema at the left greater than right lower leg and ankle  Lab Results:  Lab Results  Component Value Date   WBC 5.2 10/01/2011   HGB 11.9 10/01/2011   HCT 36.2 10/01/2011   MCV 97.1 10/01/2011   PLT 345 10/01/2011   ANC 3.1 Ferritin level pending    Medications: I have reviewed the patient's current medications.  Assessment/Plan: 1. Chronic anemia secondary to gastrointestinal blood loss and iron deficiency, she last received IV iron in December of 2012. 2. History of kidney stones, followed by Dr. Wilson Singer. 3. Gram-negative sepsis and septic shock syndrome April 2008. 4. History of acute renal failure secondary to obstruction and septic shock. 5. History of adrenal insufficiency, now maintained off of steroid hormone replacement.  She is followed by Dr. Everardo All. 6. Questionable allergic to Venofer in June 2010.  She reported an elevated blood pressure and "dizziness" on the day following the Venofer therapy.  She has tolerated iron dextran without an apparent reaction. 7. History of a left forearm fracture.  8. Intermittent low-serum bicarbonate level, ? related to diarrhea. 9. Dysphagia, improved following a dilatation procedure by Dr. Ewing Schlein last year, the  symptoms are returning. She plans to see Dr. Ewing Schlein in the next few weeks.   Disposition:  She is stable from a hematologic standpoint. We will followup on the ferritin level from today. She will return for a CBC and ferritin level in 3 months. She is scheduled for a 6 month office visit.  She will seek medical attention if the leg edema progresses.   Lucile Shutters, MD  10/01/2011  3:18 PM

## 2011-10-05 ENCOUNTER — Telehealth: Payer: Self-pay | Admitting: *Deleted

## 2011-10-05 NOTE — Telephone Encounter (Signed)
Left ferritin result on home voice mail.

## 2011-10-05 NOTE — Telephone Encounter (Signed)
Message copied by Wandalee Ferdinand on Mon Oct 05, 2011  4:15 PM ------      Message from: Thornton Papas B      Created: Thu Oct 01, 2011  9:25 PM       Please call patient, ferritin is normal

## 2011-12-31 ENCOUNTER — Other Ambulatory Visit (HOSPITAL_BASED_OUTPATIENT_CLINIC_OR_DEPARTMENT_OTHER): Payer: Medicare HMO | Admitting: Lab

## 2011-12-31 DIAGNOSIS — D500 Iron deficiency anemia secondary to blood loss (chronic): Secondary | ICD-10-CM

## 2011-12-31 DIAGNOSIS — D649 Anemia, unspecified: Secondary | ICD-10-CM

## 2011-12-31 DIAGNOSIS — K922 Gastrointestinal hemorrhage, unspecified: Secondary | ICD-10-CM

## 2011-12-31 DIAGNOSIS — D509 Iron deficiency anemia, unspecified: Secondary | ICD-10-CM

## 2011-12-31 LAB — CBC WITH DIFFERENTIAL/PLATELET
BASO%: 0.9 % (ref 0.0–2.0)
LYMPH%: 29 % (ref 14.0–49.7)
MCHC: 31.5 g/dL (ref 31.5–36.0)
MONO#: 0.6 10*3/uL (ref 0.1–0.9)
Platelets: 243 10*3/uL (ref 145–400)
RBC: 3.26 10*6/uL — ABNORMAL LOW (ref 3.70–5.45)
RDW: 16.7 % — ABNORMAL HIGH (ref 11.2–14.5)
WBC: 4.4 10*3/uL (ref 3.9–10.3)

## 2011-12-31 LAB — FERRITIN: Ferritin: 24 ng/mL (ref 10–291)

## 2012-01-01 ENCOUNTER — Telehealth: Payer: Self-pay | Admitting: *Deleted

## 2012-01-01 ENCOUNTER — Other Ambulatory Visit: Payer: Self-pay | Admitting: *Deleted

## 2012-01-01 DIAGNOSIS — D649 Anemia, unspecified: Secondary | ICD-10-CM

## 2012-01-01 NOTE — Telephone Encounter (Signed)
Left message with pt's son, Rolm Bookbinder reporting that hgb & ferriton low & will need iron infusion next 1-2 wks & scheduler should call her.

## 2012-01-04 ENCOUNTER — Telehealth: Payer: Self-pay | Admitting: Oncology

## 2012-01-04 NOTE — Telephone Encounter (Signed)
called pts lmovm for iron tx on 06/07 and to rtn call to confirm appt.

## 2012-01-08 ENCOUNTER — Ambulatory Visit (HOSPITAL_BASED_OUTPATIENT_CLINIC_OR_DEPARTMENT_OTHER): Payer: Medicare HMO

## 2012-01-08 VITALS — BP 107/68 | HR 88 | Temp 98.1°F

## 2012-01-08 DIAGNOSIS — I498 Other specified cardiac arrhythmias: Secondary | ICD-10-CM

## 2012-01-08 DIAGNOSIS — E279 Disorder of adrenal gland, unspecified: Secondary | ICD-10-CM

## 2012-01-08 DIAGNOSIS — R000 Tachycardia, unspecified: Secondary | ICD-10-CM

## 2012-01-08 DIAGNOSIS — Z87442 Personal history of urinary calculi: Secondary | ICD-10-CM

## 2012-01-08 DIAGNOSIS — D649 Anemia, unspecified: Secondary | ICD-10-CM

## 2012-01-08 DIAGNOSIS — R0602 Shortness of breath: Secondary | ICD-10-CM

## 2012-01-08 DIAGNOSIS — R011 Cardiac murmur, unspecified: Secondary | ICD-10-CM

## 2012-01-08 DIAGNOSIS — K311 Adult hypertrophic pyloric stenosis: Secondary | ICD-10-CM

## 2012-01-08 MED ORDER — DIPHENHYDRAMINE HCL 25 MG PO CAPS
25.0000 mg | ORAL_CAPSULE | Freq: Once | ORAL | Status: AC
  Administered 2012-01-08: 25 mg via ORAL

## 2012-01-08 MED ORDER — SODIUM CHLORIDE 0.9 % IV SOLN
INTRAVENOUS | Status: DC
  Administered 2012-01-08: 10:00:00 via INTRAVENOUS

## 2012-01-08 MED ORDER — SODIUM CHLORIDE 0.9 % IV SOLN
1400.0000 mg | Freq: Once | INTRAVENOUS | Status: AC
  Administered 2012-01-08: 1400 mg via INTRAVENOUS
  Filled 2012-01-08: qty 28

## 2012-01-08 MED ORDER — SODIUM CHLORIDE 0.9 % IV SOLN
50.0000 mg | Freq: Once | INTRAVENOUS | Status: AC
  Administered 2012-01-08: 50 mg via INTRAVENOUS
  Filled 2012-01-08: qty 1

## 2012-01-08 MED ORDER — ACETAMINOPHEN 325 MG PO TABS
650.0000 mg | ORAL_TABLET | Freq: Once | ORAL | Status: AC
  Administered 2012-01-08: 650 mg via ORAL

## 2012-01-08 NOTE — Patient Instructions (Signed)

## 2012-03-31 ENCOUNTER — Ambulatory Visit (HOSPITAL_BASED_OUTPATIENT_CLINIC_OR_DEPARTMENT_OTHER): Payer: Medicare HMO | Admitting: Oncology

## 2012-03-31 ENCOUNTER — Other Ambulatory Visit (HOSPITAL_BASED_OUTPATIENT_CLINIC_OR_DEPARTMENT_OTHER): Payer: Medicare HMO | Admitting: Lab

## 2012-03-31 ENCOUNTER — Telehealth: Payer: Self-pay | Admitting: Oncology

## 2012-03-31 VITALS — BP 107/66 | HR 86 | Temp 99.4°F | Resp 20 | Ht 66.0 in | Wt 96.4 lb

## 2012-03-31 DIAGNOSIS — D649 Anemia, unspecified: Secondary | ICD-10-CM

## 2012-03-31 DIAGNOSIS — D500 Iron deficiency anemia secondary to blood loss (chronic): Secondary | ICD-10-CM

## 2012-03-31 DIAGNOSIS — D509 Iron deficiency anemia, unspecified: Secondary | ICD-10-CM

## 2012-03-31 DIAGNOSIS — M810 Age-related osteoporosis without current pathological fracture: Secondary | ICD-10-CM

## 2012-03-31 LAB — CBC WITH DIFFERENTIAL/PLATELET
BASO%: 0.7 % (ref 0.0–2.0)
HCT: 33 % — ABNORMAL LOW (ref 34.8–46.6)
HGB: 10.4 g/dL — ABNORMAL LOW (ref 11.6–15.9)
MCHC: 31.5 g/dL (ref 31.5–36.0)
MONO#: 0.6 10*3/uL (ref 0.1–0.9)
NEUT%: 55.1 % (ref 38.4–76.8)
RDW: 17.4 % — ABNORMAL HIGH (ref 11.2–14.5)
WBC: 4.6 10*3/uL (ref 3.9–10.3)
lymph#: 1.2 10*3/uL (ref 0.9–3.3)

## 2012-03-31 NOTE — Progress Notes (Signed)
   [facility]    OFFICE PROGRESS NOTE   INTERVAL HISTORY:   She returns as scheduled. She last received IV iron on 01/08/2012. She reports feeling better after the iron infusion. Her bowel function has been better since she began erythromycin.  She reports an episode of bilateral lower leg swelling that was painful. She was seen by her primary physician and prescribed Lasix with resolution of the swelling and pain. She was evaluated at an orthopedic clinic in [place] and pulled that she had osteoporosis (she does not have the bone density report).  Good appetite and energy level. No new complaint.  Objective:  Vital signs in last 24 hours:  Blood pressure 107/66, pulse 86, temperature 99.4 F (37.4 C), temperature source Oral, resp. rate 20, height 5\' 6"  (1.676 m), weight 96 lb 6.4 oz (43.727 kg).    HEENT: Neck without mass Resp: Lungs clear bilaterally Cardio: Regular rate and rhythm GI: Nontender, no hepatosplenomegaly Vascular: No leg edema   Lab Results:  Lab Results  Component Value Date   WBC 4.6 03/31/2012   HGB 10.4* 03/31/2012   HCT 33.0* 03/31/2012   MCV 95.4 03/31/2012   PLT 243 03/31/2012   ANC 2.5    Medications: I have reviewed the patient's current medications.  Assessment/Plan: 1. Chronic anemia secondary to gastrointestinal blood loss and iron deficiency, she last received IV iron in June of 2013 2. History of kidney stones, followed by Dr. Wilson Singer. 3. Gram-negative sepsis and septic shock syndrome April 2008. 4. History of acute renal failure secondary to obstruction and septic shock. 5. History of adrenal insufficiency, now maintained off of steroid hormone replacement. She is followed by Dr. Everardo All. 6. Questionable allergic to Venofer in June 2010. She reported an elevated blood pressure and "dizziness" on the day following the Venofer therapy. She has tolerated iron dextran without an apparent reaction. 7. History of a left forearm  fracture.  8. Intermittent low-serum bicarbonate level, ? related to diarrhea.       9.   Dysphagia, improved with erythromycin and esophageal dilatation procedures       10. "Osteoporosis "-she will bring [place] a copy of the bone density scan. I recommended she followup with her primary physician for treatment recommendations.  Disposition:  She has chronic anemia secondary to iron deficiency, chronic GI blood loss, and there may be a component of malnutrition. We will followup on the ferritin level from today. She will return for a CBC and ferritin level in 2 months. She is scheduled for a 6 month office visit.   Thornton Papas, MD  03/31/2012  3:58 PM

## 2012-03-31 NOTE — Telephone Encounter (Signed)
gve the pt her oct,dec and feb 2014 appt calendar

## 2012-03-31 NOTE — Patient Instructions (Signed)
Brittany Hensley May 15, 1951 [id number]  [facility] Discharge Instructions  Your exam findings, labs and results were discussed with your MD today.  Filed Vitals:   03/31/12 1433  BP: 107/66  Pulse: 86  Temp: 99.4 F (37.4 C)  Resp: 20   Current outpatient prescriptions:ALPRAZolam (XANAX) 0.5 MG tablet, Take 0.5 mg by mouth 3 (three) times daily as needed.  , Disp: , Rfl: ;  calcium carbonate (OS-CAL) 600 MG TABS, Take 600 mg by mouth 3 (three) times daily.  , Disp: , Rfl: ;  ergocalciferol (DRISDOL) 8000 UNIT/ML drops, Place 4,000 Units under the tongue daily., Disp: , Rfl: ;  ERY-TAB 250 MG EC tablet, Take 1 tablet by mouth Three times a day., Disp: , Rfl:  fish oil-omega-3 fatty acids 1000 MG capsule, Take 1 g by mouth daily.  , Disp: , Rfl: ;  levothyroxine (SYNTHROID, LEVOTHROID) 88 MCG tablet, Take 88 mcg by mouth Daily., Disp: , Rfl: ;  loratadine (CLARITIN) 10 MG tablet, Take 10 mg by mouth 2 (two) times daily.  , Disp: , Rfl: ;  magnesium gluconate (MAGONATE) 500 MG tablet, Take 250 mg by mouth 2 (two) times daily.  , Disp: , Rfl:  metroNIDAZOLE (METROGEL) 1 % gel, Apply 1 application topically daily.  , Disp: , Rfl: ;  Multiple Vitamin (MULTIVITAMIN) capsule, Take 1 capsule by mouth 2 (two) times daily.  , Disp: , Rfl: ;  omeprazole (PRILOSEC) 40 MG capsule, Take 40 mg by mouth daily.  , Disp: , Rfl: ;  sucralfate (CARAFATE) 1 G tablet, Take 1 tablet by mouth 2 (two) times daily., Disp: , Rfl:  vitamin C (ASCORBIC ACID) 500 MG tablet, Take 500 mg by mouth 2 (two) times daily.  , Disp: , Rfl: ;  cholecalciferol (VITAMIN D) 1000 UNITS tablet, Take 1,000 Units by mouth daily.  , Disp: , Rfl: ;  doxycycline (MONODOX) 100 MG capsule, Take 50 mg by mouth 1 day or 1 dose.  , Disp: , Rfl: ;  potassium chloride (KLOR-CON) 10 MEQ CR tablet, Take 10 mEq by mouth 2 (two) times daily.  , Disp: , Rfl:   Please visit scheduling to obtain calendar for future appointments.  Please call  the [facility] at [phone] during business hours should you have any further questions or need assistance in obtaining follow-up care. If you have a medical emergency, please dial [phone].  Special Instructions:

## 2012-04-08 ENCOUNTER — Telehealth: Payer: Self-pay | Admitting: *Deleted

## 2012-04-08 NOTE — Telephone Encounter (Signed)
Spoke to pt's son, he verbalized understanding and will inform pt regarding ferritin level.  SLJ

## 2012-04-08 NOTE — Telephone Encounter (Signed)
Message copied by Caren Griffins on Fri Apr 08, 2012  4:42 PM ------      Message from: [place], [place] P      Created: Fri Apr 08, 2012  4:28 PM                   ----- Message -----         From: Ladene Artist, MD         Sent: 03/31/2012  10:00 PM           To: Wandalee Ferdinand, RN, Glori Luis, RN, #            Please call patient, ferritin is normal, we will schedule IV iron if the ferritin is lower in 2 months

## 2012-06-02 ENCOUNTER — Other Ambulatory Visit: Payer: Medicare HMO | Admitting: Lab

## 2012-06-02 ENCOUNTER — Ambulatory Visit (HOSPITAL_BASED_OUTPATIENT_CLINIC_OR_DEPARTMENT_OTHER): Payer: Medicare HMO | Admitting: Lab

## 2012-06-02 DIAGNOSIS — D649 Anemia, unspecified: Secondary | ICD-10-CM

## 2012-06-02 LAB — CBC & DIFF AND RETIC
Basophils Absolute: 0 10*3/uL (ref 0.0–0.1)
Eosinophils Absolute: 0.2 10*3/uL (ref 0.0–0.5)
HGB: 10.8 g/dL — ABNORMAL LOW (ref 11.6–15.9)
Immature Retic Fract: 11.5 % — ABNORMAL HIGH (ref 1.60–10.00)
MCV: 98.3 fL (ref 79.5–101.0)
MONO#: 0.6 10*3/uL (ref 0.1–0.9)
MONO%: 12.6 % (ref 0.0–14.0)
NEUT#: 2.7 10*3/uL (ref 1.5–6.5)
RBC: 3.47 10*6/uL — ABNORMAL LOW (ref 3.70–5.45)
RDW: 15 % — ABNORMAL HIGH (ref 11.2–14.5)
Retic %: 1.7 % (ref 0.70–2.10)
Retic Ct Abs: 58.99 10*3/uL (ref 33.70–90.70)
WBC: 5 10*3/uL (ref 3.9–10.3)
lymph#: 1.4 10*3/uL (ref 0.9–3.3)

## 2012-06-02 LAB — FERRITIN: Ferritin: 12 ng/mL (ref 10–291)

## 2012-06-06 ENCOUNTER — Other Ambulatory Visit: Payer: Self-pay | Admitting: *Deleted

## 2012-06-06 ENCOUNTER — Telehealth: Payer: Self-pay | Admitting: *Deleted

## 2012-06-06 NOTE — Telephone Encounter (Signed)
Patient called with Ferritin results drawn 06/02/2012.  Ferritin is low, per Dr. Truett Perna, will need IV iron replacement therapy in the next couple weeks.  POF submitted, patient aware to await call from scheduling to make this appointment.

## 2012-06-09 ENCOUNTER — Telehealth: Payer: Self-pay | Admitting: Oncology

## 2012-06-09 ENCOUNTER — Other Ambulatory Visit: Payer: Self-pay | Admitting: Oncology

## 2012-06-09 DIAGNOSIS — D509 Iron deficiency anemia, unspecified: Secondary | ICD-10-CM

## 2012-06-09 DIAGNOSIS — D500 Iron deficiency anemia secondary to blood loss (chronic): Secondary | ICD-10-CM

## 2012-06-09 NOTE — Telephone Encounter (Signed)
Talked to son yesterday and gave him appt for patient for IVIG on 11/8/th

## 2012-06-10 ENCOUNTER — Ambulatory Visit (HOSPITAL_BASED_OUTPATIENT_CLINIC_OR_DEPARTMENT_OTHER): Payer: Medicare HMO

## 2012-06-10 VITALS — BP 106/61 | HR 84 | Temp 98.1°F | Resp 18

## 2012-06-10 DIAGNOSIS — D509 Iron deficiency anemia, unspecified: Secondary | ICD-10-CM

## 2012-06-10 MED ORDER — SODIUM CHLORIDE 0.9 % IV SOLN
1400.0000 mg | Freq: Once | INTRAVENOUS | Status: AC
  Administered 2012-06-10: 1400 mg via INTRAVENOUS
  Filled 2012-06-10: qty 28

## 2012-06-10 MED ORDER — ACETAMINOPHEN 325 MG PO TABS
650.0000 mg | ORAL_TABLET | Freq: Once | ORAL | Status: AC
  Administered 2012-06-10: 650 mg via ORAL

## 2012-06-10 MED ORDER — SODIUM CHLORIDE 0.9 % IV SOLN
50.0000 mg | Freq: Once | INTRAVENOUS | Status: AC
  Administered 2012-06-10: 50 mg via INTRAVENOUS
  Filled 2012-06-10: qty 1

## 2012-06-10 MED ORDER — SODIUM CHLORIDE 0.9 % IV SOLN
Freq: Once | INTRAVENOUS | Status: AC
  Administered 2012-06-10: 20 mL via INTRAVENOUS

## 2012-06-10 MED ORDER — SODIUM CHLORIDE 0.9 % IJ SOLN
10.0000 mL | INTRAMUSCULAR | Status: DC | PRN
  Filled 2012-06-10: qty 10

## 2012-06-10 MED ORDER — DIPHENHYDRAMINE HCL 25 MG PO TABS
25.0000 mg | ORAL_TABLET | Freq: Once | ORAL | Status: AC
  Administered 2012-06-10: 25 mg via ORAL
  Filled 2012-06-10: qty 1

## 2012-06-10 NOTE — Progress Notes (Signed)
Iron rate increased to infuse over 4 hours @ 138cc/hr

## 2012-06-14 ENCOUNTER — Telehealth: Payer: Self-pay | Admitting: *Deleted

## 2012-06-14 NOTE — Telephone Encounter (Signed)
Patient called back and only need one treatment. JMW

## 2012-06-14 NOTE — Telephone Encounter (Signed)
Per staff message and POF I have tried to schedule. I have called and left patient a message to call me back. JMW

## 2012-07-20 ENCOUNTER — Telehealth: Payer: Self-pay | Admitting: Oncology

## 2012-07-20 NOTE — Telephone Encounter (Signed)
Called patient and left message regarding lab on 12/19, change of time

## 2012-07-21 ENCOUNTER — Other Ambulatory Visit: Payer: Medicare HMO | Admitting: Lab

## 2012-07-21 ENCOUNTER — Other Ambulatory Visit (HOSPITAL_BASED_OUTPATIENT_CLINIC_OR_DEPARTMENT_OTHER): Payer: Medicare HMO

## 2012-07-21 DIAGNOSIS — D649 Anemia, unspecified: Secondary | ICD-10-CM

## 2012-07-21 DIAGNOSIS — D509 Iron deficiency anemia, unspecified: Secondary | ICD-10-CM

## 2012-07-21 LAB — CBC & DIFF AND RETIC
Basophils Absolute: 0 10*3/uL (ref 0.0–0.1)
Eosinophils Absolute: 0.2 10*3/uL (ref 0.0–0.5)
HGB: 12.1 g/dL (ref 11.6–15.9)
Immature Retic Fract: 12.4 % — ABNORMAL HIGH (ref 1.60–10.00)
MCV: 98.2 fL (ref 79.5–101.0)
MONO#: 0.5 10*3/uL (ref 0.1–0.9)
NEUT#: 3.3 10*3/uL (ref 1.5–6.5)
RDW: 15.4 % — ABNORMAL HIGH (ref 11.2–14.5)
Retic %: 1.91 % (ref 0.70–2.10)
Retic Ct Abs: 72.77 10*3/uL (ref 33.70–90.70)
WBC: 5.4 10*3/uL (ref 3.9–10.3)
lymph#: 1.4 10*3/uL (ref 0.9–3.3)

## 2012-07-21 LAB — FERRITIN: Ferritin: 100 ng/mL (ref 10–291)

## 2012-07-26 ENCOUNTER — Telehealth: Payer: Self-pay | Admitting: *Deleted

## 2012-07-26 NOTE — Telephone Encounter (Signed)
Message copied by Phillis Knack on Tue Jul 26, 2012 10:22 AM ------      Message from: Wandalee Ferdinand      Created: Tue Jul 26, 2012 10:10 AM                   ----- Message -----         From: Ladene Artist, MD         Sent: 07/22/2012   7:27 PM           To: Wandalee Ferdinand, RN, Glori Luis, RN, #            Please call patient, ferritin and hb are ok, f/u as scheduled

## 2012-07-26 NOTE — Telephone Encounter (Signed)
Spoke with son. He will let her know labs are OK per Dr Truett Perna. To call if she has any questions.

## 2012-09-23 ENCOUNTER — Telehealth: Payer: Self-pay | Admitting: Oncology

## 2012-09-23 NOTE — Telephone Encounter (Signed)
Called pt and left message regarding appt on 10/06/12

## 2012-10-06 ENCOUNTER — Other Ambulatory Visit (HOSPITAL_BASED_OUTPATIENT_CLINIC_OR_DEPARTMENT_OTHER): Payer: Medicare Other | Admitting: Lab

## 2012-10-06 ENCOUNTER — Ambulatory Visit: Payer: Medicare HMO | Admitting: Oncology

## 2012-10-06 ENCOUNTER — Ambulatory Visit (HOSPITAL_BASED_OUTPATIENT_CLINIC_OR_DEPARTMENT_OTHER): Payer: Medicare Other | Admitting: Oncology

## 2012-10-06 ENCOUNTER — Telehealth: Payer: Self-pay | Admitting: Oncology

## 2012-10-06 ENCOUNTER — Other Ambulatory Visit: Payer: Medicare HMO | Admitting: Lab

## 2012-10-06 VITALS — BP 120/66 | HR 91 | Temp 97.8°F | Resp 20 | Ht 66.0 in | Wt 100.6 lb

## 2012-10-06 DIAGNOSIS — D649 Anemia, unspecified: Secondary | ICD-10-CM

## 2012-10-06 LAB — CBC WITH DIFFERENTIAL/PLATELET
BASO%: 1.2 % (ref 0.0–2.0)
Eosinophils Absolute: 0.2 10*3/uL (ref 0.0–0.5)
MCHC: 33 g/dL (ref 31.5–36.0)
MONO#: 0.6 10*3/uL (ref 0.1–0.9)
NEUT#: 2.5 10*3/uL (ref 1.5–6.5)
RBC: 3.67 10*6/uL — ABNORMAL LOW (ref 3.70–5.45)
RDW: 15.8 % — ABNORMAL HIGH (ref 11.2–14.5)
WBC: 4.9 10*3/uL (ref 3.9–10.3)

## 2012-10-06 LAB — FERRITIN: Ferritin: 40 ng/mL (ref 10–291)

## 2012-10-06 NOTE — Progress Notes (Signed)
   [facility]    OFFICE PROGRESS NOTE   INTERVAL HISTORY:   She returns as scheduled.she felt better after receiving IV iron in November of 2013.  She is now taking erythromycin and has noted an improvement her GI motility symptoms. She feels well today.  Objective:  Vital signs in last 24 hours:  Blood pressure 120/66, pulse 91, temperature 97.8 F (36.6 C), temperature source Oral, resp. rate 20, height 5\' 6"  (1.676 m), weight 100 lb 9.6 oz (45.632 kg).   Resp: lungs clear bilaterally Cardio: regular rate and rhythm GI: nontender, no hepatosplenomegaly Vascular: no leg edema   Lab Results:  Lab Results  Component Value Date   WBC 4.9 10/06/2012   HGB 12.0 10/06/2012   HCT 36.3 10/06/2012   MCV 98.9 10/06/2012   PLT 263 10/06/2012  ANC 2.5  Ferritin 40 (100 on 07/21/2012,12 on 06/02/2012)    Medications: I have reviewed the patient's current medications.  Assessment/Plan: 1. Chronic anemia secondary to gastrointestinal blood loss and iron deficiency, she last received IV iron in November of 2013. 2. History of kidney stones, followed by Dr. Wilson Singer. 3. Gram-negative sepsis and septic shock syndrome April 2008. 4. History of acute renal failure secondary to obstruction and septic shock. 5. History of adrenal insufficiency, now maintained off of steroid hormone replacement. She is followed by Dr. Everardo All. 6. Questionable allergic to Venofer in June 2010. She reported an elevated blood pressure and "dizziness" on the day following the Venofer therapy. She has tolerated iron dextran without an apparent reaction. 7. History of a left forearm fracture.  8. Intermittent low-serum bicarbonate level, ? related to diarrhea. 9. Dysphagia, improved with erythromycin and esophageal dilatation procedures   Disposition:  The ferritin is lower today. She will be scheduled for IV iron within the next one week. She will return for a CBC and ferritin level in 3 months. Ms.  Ferrufino is scheduled for a 6 month office visit.   Thornton Papas, MD  10/06/2012  9:19 PM

## 2012-10-16 ENCOUNTER — Other Ambulatory Visit: Payer: Self-pay | Admitting: Oncology

## 2012-10-17 ENCOUNTER — Other Ambulatory Visit: Payer: Self-pay | Admitting: *Deleted

## 2012-10-19 ENCOUNTER — Telehealth: Payer: Self-pay | Admitting: Oncology

## 2012-10-19 ENCOUNTER — Other Ambulatory Visit: Payer: Self-pay | Admitting: Oncology

## 2012-10-19 NOTE — Telephone Encounter (Signed)
Sent tanya a staff message to add the iv iron appt asap.

## 2012-10-20 ENCOUNTER — Telehealth: Payer: Self-pay | Admitting: Oncology

## 2012-10-20 NOTE — Telephone Encounter (Signed)
LMONVM ADVISING THE PT OF HER IV IRON INFUSION FOR 10/24/2012@8 :30AM

## 2012-10-21 ENCOUNTER — Telehealth: Payer: Self-pay | Admitting: Oncology

## 2012-10-21 NOTE — Telephone Encounter (Signed)
Pt called and r/s appt from, 3/24 to any thursday, gave to [facility] to schedule for anyn thursday, Susan RN notified

## 2012-10-24 ENCOUNTER — Ambulatory Visit: Payer: Medicare Other

## 2012-10-27 ENCOUNTER — Ambulatory Visit (HOSPITAL_BASED_OUTPATIENT_CLINIC_OR_DEPARTMENT_OTHER): Payer: Medicare Other

## 2012-10-27 VITALS — BP 120/75 | HR 79 | Temp 98.1°F | Resp 18

## 2012-10-27 DIAGNOSIS — D509 Iron deficiency anemia, unspecified: Secondary | ICD-10-CM

## 2012-10-27 MED ORDER — DIPHENHYDRAMINE HCL 25 MG PO CAPS
25.0000 mg | ORAL_CAPSULE | Freq: Once | ORAL | Status: AC
  Administered 2012-10-27: 25 mg via ORAL

## 2012-10-27 MED ORDER — ACETAMINOPHEN 325 MG PO TABS
650.0000 mg | ORAL_TABLET | Freq: Once | ORAL | Status: AC
  Administered 2012-10-27: 650 mg via ORAL

## 2012-10-27 MED ORDER — SODIUM CHLORIDE 0.9 % IV SOLN
1400.0000 mg | Freq: Once | INTRAVENOUS | Status: AC
  Administered 2012-10-27: 1400 mg via INTRAVENOUS
  Filled 2012-10-27: qty 28

## 2012-10-27 MED ORDER — SODIUM CHLORIDE 0.9 % IV SOLN: 50.0000 mg | Freq: Once | INTRAVENOUS | Status: DC

## 2012-10-27 NOTE — Patient Instructions (Signed)

## 2012-11-17 ENCOUNTER — Other Ambulatory Visit: Payer: Self-pay

## 2012-11-17 MED ORDER — ALPRAZOLAM 0.5 MG PO TABS: ORAL_TABLET | ORAL | Status: DC

## 2012-11-17 NOTE — Telephone Encounter (Signed)
Last written 08/18/12 for #60 with 2 RF's  Last seen 08/18/12  Have nurse call in and inform patient

## 2012-12-19 ENCOUNTER — Other Ambulatory Visit: Payer: Self-pay

## 2012-12-19 MED ORDER — ALPRAZOLAM 0.5 MG PO TABS: ORAL_TABLET | ORAL | Status: DC

## 2012-12-19 NOTE — Telephone Encounter (Signed)
Please call in rx for xanax 

## 2012-12-19 NOTE — Telephone Encounter (Signed)
Last filled 11/16/12   Last seen 1/16  Call in and have nurse call patient to let her know

## 2012-12-19 NOTE — Telephone Encounter (Signed)
Script called in and lmom for pt.

## 2013-01-05 ENCOUNTER — Other Ambulatory Visit (HOSPITAL_BASED_OUTPATIENT_CLINIC_OR_DEPARTMENT_OTHER): Payer: Medicare Other

## 2013-01-05 DIAGNOSIS — D649 Anemia, unspecified: Secondary | ICD-10-CM

## 2013-01-05 DIAGNOSIS — D500 Iron deficiency anemia secondary to blood loss (chronic): Secondary | ICD-10-CM

## 2013-01-05 LAB — CBC WITH DIFFERENTIAL/PLATELET
BASO%: 0.9 % (ref 0.0–2.0)
EOS%: 2.9 % (ref 0.0–7.0)
HCT: 38 % (ref 34.8–46.6)
LYMPH%: 30.3 % (ref 14.0–49.7)
MCH: 31.6 pg (ref 25.1–34.0)
MCHC: 32.9 g/dL (ref 31.5–36.0)
MONO#: 0.7 10*3/uL (ref 0.1–0.9)
NEUT%: 54.3 % (ref 38.4–76.8)
Platelets: 273 10*3/uL (ref 145–400)
RBC: 3.96 10*6/uL (ref 3.70–5.45)
WBC: 5.8 10*3/uL (ref 3.9–10.3)
lymph#: 1.8 10*3/uL (ref 0.9–3.3)

## 2013-01-05 LAB — FERRITIN: Ferritin: 113 ng/mL (ref 10–291)

## 2013-01-09 ENCOUNTER — Telehealth: Payer: Self-pay | Admitting: *Deleted

## 2013-01-09 NOTE — Telephone Encounter (Signed)
Message copied by Wandalee Ferdinand on Mon Jan 09, 2013 12:51 PM ------      Message from: Thornton Papas B      Created: Thu Jan 05, 2013  9:46 PM       Please call patient, ferritin is ok, f/u as scheduled ------

## 2013-01-09 NOTE — Telephone Encounter (Signed)
Notified of normal ferritin result. She is feeling well. Reports she has just recently had her mother placed in long term care facility.

## 2013-01-14 ENCOUNTER — Other Ambulatory Visit: Payer: Self-pay | Admitting: *Deleted

## 2013-01-14 MED ORDER — OMEPRAZOLE 40 MG PO CPDR: 40.0000 mg | DELAYED_RELEASE_CAPSULE | Freq: Every day | ORAL | Status: DC

## 2013-01-14 NOTE — Telephone Encounter (Signed)
Last filled 12/19/12, has appt 01/20/13 with you, if approved [facility]

## 2013-01-16 MED ORDER — ALPRAZOLAM 0.5 MG PO TABS: ORAL_TABLET | ORAL | Status: DC

## 2013-01-16 MED ORDER — METRONIDAZOLE 1 % EX GEL: 1.0000 "application " | Freq: Every day | CUTANEOUS | Status: DC

## 2013-01-16 NOTE — Telephone Encounter (Signed)
Call in xanax rx with 1 refill 

## 2013-01-16 NOTE — Telephone Encounter (Signed)
[facility] called [facility].

## 2013-01-20 ENCOUNTER — Ambulatory Visit (INDEPENDENT_AMBULATORY_CARE_PROVIDER_SITE_OTHER): Payer: Medicare Other | Admitting: Nurse Practitioner

## 2013-01-20 ENCOUNTER — Encounter: Payer: Self-pay | Admitting: Nurse Practitioner

## 2013-01-20 VITALS — BP 107/62 | HR 88 | Temp 97.2°F | Ht 64.0 in | Wt 98.5 lb

## 2013-01-20 DIAGNOSIS — R600 Localized edema: Secondary | ICD-10-CM

## 2013-01-20 DIAGNOSIS — L719 Rosacea, unspecified: Secondary | ICD-10-CM

## 2013-01-20 DIAGNOSIS — R609 Edema, unspecified: Secondary | ICD-10-CM

## 2013-01-20 DIAGNOSIS — D649 Anemia, unspecified: Secondary | ICD-10-CM

## 2013-01-20 DIAGNOSIS — K219 Gastro-esophageal reflux disease without esophagitis: Secondary | ICD-10-CM

## 2013-01-20 DIAGNOSIS — E039 Hypothyroidism, unspecified: Secondary | ICD-10-CM

## 2013-01-20 DIAGNOSIS — F411 Generalized anxiety disorder: Secondary | ICD-10-CM

## 2013-01-20 DIAGNOSIS — E876 Hypokalemia: Secondary | ICD-10-CM

## 2013-01-20 LAB — COMPLETE METABOLIC PANEL WITH GFR
Alkaline Phosphatase: 38 U/L — ABNORMAL LOW (ref 39–117)
BUN: 22 mg/dL (ref 6–23)
Creat: 1.28 mg/dL — ABNORMAL HIGH (ref 0.50–1.10)
GFR, Est Non African American: 45 mL/min — ABNORMAL LOW
Glucose, Bld: 88 mg/dL (ref 70–99)
Total Bilirubin: 0.1 mg/dL — ABNORMAL LOW (ref 0.3–1.2)

## 2013-01-20 LAB — THYROID PANEL WITH TSH
T3 Uptake: 32.6 % (ref 22.5–37.0)
T4, Total: 6.7 ug/dL (ref 5.0–12.5)

## 2013-01-20 MED ORDER — METRONIDAZOLE 1 % EX GEL: 1.0000 "application " | Freq: Every day | CUTANEOUS | Status: DC

## 2013-01-20 MED ORDER — POTASSIUM CHLORIDE ER 10 MEQ PO TBCR: 10.0000 meq | EXTENDED_RELEASE_TABLET | Freq: Two times a day (BID) | ORAL | Status: DC

## 2013-01-20 MED ORDER — ALPRAZOLAM 0.5 MG PO TABS: ORAL_TABLET | ORAL | Status: DC

## 2013-01-20 MED ORDER — LEVOTHYROXINE SODIUM 88 MCG PO TABS: 88.0000 ug | ORAL_TABLET | Freq: Every day | ORAL | Status: DC

## 2013-01-20 MED ORDER — FUROSEMIDE 20 MG PO TABS: 20.0000 mg | ORAL_TABLET | Freq: Two times a day (BID) | ORAL | Status: DC

## 2013-01-20 MED ORDER — OMEPRAZOLE 40 MG PO CPDR: 40.0000 mg | DELAYED_RELEASE_CAPSULE | Freq: Every day | ORAL | Status: DC

## 2013-01-20 NOTE — Progress Notes (Signed)
Subjective:    Patient ID: Brittany Hensley, female    DOB: 01-Jun-1951, 62 y.o.   MRN: [id number]  Anemia Presents for follow-up visit. There has been no abdominal pain, bruising/bleeding easily, leg swelling, malaise/fatigue, palpitations or weight loss. There are no compliance problems.  Compliance with medications is 51-75%.  Anxiety Patient reports no depressed mood or palpitations.   Her past medical history is significant for anemia.  Thyroid Problem Presents for follow-up (hypothyroidism) visit. Patient reports no cold intolerance, depressed mood, diaphoresis, dry skin, fatigue, hair loss, heat intolerance, hoarse voice, leg swelling, menstrual problem, nail problem, palpitations, visual change, weight gain or weight loss. The symptoms have been stable.  GERD Omeprazole - Sees Dr. Ewing Schlein- Is scheduled to have esophagus stretched next week hypokalemia K daily- no c/o lower extremity cramps. Peripheral edema Lasix keeps swelling down.  Review of Systems  Constitutional: Negative for weight loss, weight gain, malaise/fatigue, diaphoresis and fatigue.  HENT: Negative for hoarse voice.   Cardiovascular: Negative for palpitations.  Gastrointestinal: Negative for abdominal pain.  Endocrine: Negative for cold intolerance and heat intolerance.  Genitourinary: Negative for menstrual problem.  Hematological: Does not bruise/bleed easily.  All other systems reviewed and are negative.       Objective:   Physical Exam  Constitutional: She is oriented to person, place, and time. She appears well-developed and well-nourished.  HENT:  Nose: Nose normal.  Mouth/Throat: Oropharynx is clear and moist.  Eyes: EOM are normal.  Neck: Trachea normal, normal range of motion and full passive range of motion without pain. Neck supple. No JVD present. Carotid bruit is not present. No thyromegaly present.  Cardiovascular: Normal rate, regular rhythm, normal heart sounds and intact distal pulses.  Exam  reveals no gallop and no friction rub.   No murmur heard. Pulmonary/Chest: Effort normal and breath sounds normal.  Abdominal: Soft. Bowel sounds are normal. She exhibits no distension and no mass. There is no tenderness.  Musculoskeletal: Normal range of motion.  Lymphadenopathy:    Cervical adenopathy: bilateral tonsillar nodules.  Neurological: She is alert and oriented to person, place, and time. She has normal reflexes.  Skin: Skin is warm and dry.  Psychiatric: She has a normal mood and affect. Her behavior is normal. Judgment and thought content normal.    BP 107/62  Pulse 88  Temp(Src) 97.2 F (36.2 C) (Oral)  Ht 5\' 4"  (1.626 m)  Wt 98 lb 8 oz (44.679 kg)  BMI 16.9 kg/m2       Assessment & Plan:   1. Anemia, unspecified   2. GERD (gastroesophageal reflux disease)   3. Unspecified hypothyroidism   4. Peripheral edema   5. Hypokalemia   6. Rosacea   7. GAD (generalized anxiety disorder)    Orders Placed This Encounter  Procedures  . COMPLETE METABOLIC PANEL WITH GFR  . NMR Lipoprofile with Lipids  . Thyroid Panel With TSH   Meds ordered this encounter  Medications  . DISCONTD: PREPOPIK 10-3.5-12 MG-GM-GM PACK    Sig:   . DISCONTD: furosemide (LASIX) 20 MG tablet    Sig: Take 20 mg by mouth 2 (two) times daily.  Marland Kitchen ALPRAZolam (XANAX) 0.5 MG tablet    Sig: 1/2 - 1 q 8-12 hrs PRN    Dispense:  60 tablet    Refill:  1    Order Specific Question:  Supervising Provider    Answer:  Ernestina Penna [[id number]]  . furosemide (LASIX) 20 MG tablet    Sig: Take  1 tablet (20 mg total) by mouth 2 (two) times daily.    Dispense:  30 tablet    Refill:  5    Order Specific Question:  Supervising Provider    Answer:  Ernestina Penna [[id number]]  . levothyroxine (SYNTHROID, LEVOTHROID) 88 MCG tablet    Sig: Take 1 tablet (88 mcg total) by mouth daily before breakfast.    Dispense:  30 tablet    Refill:  5    Order Specific Question:  Supervising Provider    Answer:  Ernestina Penna [[id number]]  . metroNIDAZOLE (METROGEL) 1 % gel    Sig: Apply 1 application topically daily.    Dispense:  45 g    Refill:  3    Order Specific Question:  Supervising Provider    Answer:  Ernestina Penna [[id number]]  . omeprazole (PRILOSEC) 40 MG capsule    Sig: Take 1 capsule (40 mg total) by mouth daily.    Dispense:  30 capsule    Refill:  5    Order Specific Question:  Supervising Provider    Answer:  Ernestina Penna [[id number]]  . potassium chloride (KLOR-CON 10) 10 MEQ tablet    Sig: Take 1 tablet (10 mEq total) by mouth 2 (two) times daily.    Dispense:  30 tablet    Refill:  5    Order Specific Question:  Supervising Provider    Answer:  Ernestina Penna [[id number]]   Continue all meds Labs pending Exercise encouraged Patient wants to check with Dr. Alisia Ferrari at the cancer center before having tests on neck nodules. ( sent Dr. Truett Perna a message in epic) RTP prn  Mary-Margaret Daphine Deutscher, FNP

## 2013-01-20 NOTE — Patient Instructions (Signed)

## 2013-01-24 ENCOUNTER — Telehealth: Payer: Self-pay | Admitting: Nurse Practitioner

## 2013-01-24 LAB — NMR LIPOPROFILE WITH LIPIDS
HDL Size: 9 nm — ABNORMAL LOW (ref >=9.2)
HDL-C: 60 mg/dL (ref >=40)
LDL Particle Number: 1111 nmol/L — ABNORMAL HIGH (ref <1000)
LDL Size: 22.1 nm (ref >20.5)
Triglycerides: 141 mg/dL (ref <150)
VLDL Size: 39.8 nm (ref <=46.6)

## 2013-01-24 NOTE — Telephone Encounter (Signed)
Pt aware of labs  

## 2013-02-07 ENCOUNTER — Telehealth: Payer: Self-pay | Admitting: *Deleted

## 2013-02-07 NOTE — Telephone Encounter (Signed)
Left VM for patient to call office to discuss her visit with Mary-Margaret Daphine Deutscher, FNP. She had felt bilateral lymphadenopathy tonsillar. Dr. Truett Perna aware and said he would see her prior to September if she would like.

## 2013-02-08 ENCOUNTER — Telehealth: Payer: Self-pay | Admitting: *Deleted

## 2013-02-08 NOTE — Telephone Encounter (Signed)
Pt returned call, she states she has noticed no change in lymph nodes. Doesn't think she needs to be seen sooner than Sept. Pt reports she sees Dr. Ewing Schlein on 7/21 for colonoscopy, endoscopy, and dilatation. Asks that Dr. Truett Perna contact Dr. Ewing Schlein to see if he can tell her if this is worrisome. Pt stated she will call office if she feels the need to be seen sooner.

## 2013-03-08 ENCOUNTER — Encounter: Payer: Self-pay | Admitting: *Deleted

## 2013-03-10 ENCOUNTER — Telehealth: Payer: Self-pay | Admitting: Nurse Practitioner

## 2013-03-11 ENCOUNTER — Telehealth: Payer: Self-pay | Admitting: Nurse Practitioner

## 2013-03-13 ENCOUNTER — Other Ambulatory Visit: Payer: Self-pay | Admitting: *Deleted

## 2013-03-13 DIAGNOSIS — F411 Generalized anxiety disorder: Secondary | ICD-10-CM

## 2013-03-13 MED ORDER — ALPRAZOLAM 0.5 MG PO TABS: ORAL_TABLET | ORAL | Status: DC

## 2013-03-13 NOTE — Telephone Encounter (Signed)
Please call in xanax rx with 1 refill 

## 2013-03-13 NOTE — Telephone Encounter (Signed)
LAST RF 02/11/13. CALL IN [facility] IF APPROVED. [phone]. LAST OV 01/20/13.

## 2013-03-13 NOTE — Telephone Encounter (Signed)
WAITING ON APPROVAL FROM PROVIDER.

## 2013-03-13 NOTE — Telephone Encounter (Signed)
Med called to pharm and pt aware 

## 2013-03-14 NOTE — Telephone Encounter (Signed)
rx was written and printed- please call in rx xanax 0.5 1-2 po BID #90 with 1 refill- Tell patient nit to take if doesn't need

## 2013-03-14 NOTE — Telephone Encounter (Signed)
[facility] and [facility] will notify pt.

## 2013-03-25 ENCOUNTER — Telehealth: Payer: Self-pay | Admitting: *Deleted

## 2013-03-25 NOTE — Telephone Encounter (Signed)
Patient is normally prescribed xanax 0.5mg  TID. She switched to our office and it was changed to 2-3 times a day #60. She was anxious last night and this morning.  Would like to have the prescription changed back to TID #90. Told her that it is ok to take it TID just not everyday and that I would relay this message to Gennette Pac, FNP.

## 2013-03-27 NOTE — Telephone Encounter (Signed)
We only like to do BID- do we have your old records that say TID- Meds are very addicting and we do not like to rx TID unless we have to

## 2013-03-27 NOTE — Telephone Encounter (Signed)
That will be fine. 

## 2013-03-27 NOTE — Telephone Encounter (Signed)
Patient states that she signed a release for her records. The front is looking for them. States that she has an appt on 04-24-13. Wants to know if she can bring in old bottles for you to see as proof she was taking TID in case we have not received records. She has a lot going on right now and she seems to think that in a few months she will not need them so much. Wants to discuss change with you at appt but wants to know if you will fill for TID until then

## 2013-03-28 NOTE — Telephone Encounter (Signed)
Called xanax to [facility] for enough until appt. Will discuss dose change then. Patient notified

## 2013-04-06 ENCOUNTER — Other Ambulatory Visit (HOSPITAL_BASED_OUTPATIENT_CLINIC_OR_DEPARTMENT_OTHER): Payer: Medicare Other | Admitting: Lab

## 2013-04-06 ENCOUNTER — Telehealth: Payer: Self-pay | Admitting: Oncology

## 2013-04-06 ENCOUNTER — Ambulatory Visit (HOSPITAL_BASED_OUTPATIENT_CLINIC_OR_DEPARTMENT_OTHER): Payer: Medicare Other | Admitting: Oncology

## 2013-04-06 VITALS — BP 115/58 | HR 91 | Temp 98.9°F | Resp 18 | Ht 64.0 in | Wt 101.0 lb

## 2013-04-06 DIAGNOSIS — D649 Anemia, unspecified: Secondary | ICD-10-CM

## 2013-04-06 DIAGNOSIS — D500 Iron deficiency anemia secondary to blood loss (chronic): Secondary | ICD-10-CM

## 2013-04-06 DIAGNOSIS — D509 Iron deficiency anemia, unspecified: Secondary | ICD-10-CM

## 2013-04-06 LAB — CBC WITH DIFFERENTIAL/PLATELET
Eosinophils Absolute: 0.1 10*3/uL (ref 0.0–0.5)
HCT: 35.5 % (ref 34.8–46.6)
LYMPH%: 30.7 % (ref 14.0–49.7)
MCHC: 33.1 g/dL (ref 31.5–36.0)
MCV: 99.2 fL (ref 79.5–101.0)
MONO%: 11.1 % (ref 0.0–14.0)
NEUT#: 2.5 10*3/uL (ref 1.5–6.5)
NEUT%: 54 % (ref 38.4–76.8)
Platelets: 249 10*3/uL (ref 145–400)
RBC: 3.58 10*6/uL — ABNORMAL LOW (ref 3.70–5.45)

## 2013-04-06 NOTE — Telephone Encounter (Signed)
Gave pt appt for labs and MD on December and MArch 26th 2015 first available for MD

## 2013-04-06 NOTE — Progress Notes (Signed)
   [facility]    OFFICE PROGRESS NOTE   INTERVAL HISTORY:   She returns as scheduled. She reports increased fatigue recently. She last received IV iron in March of this year. No bleeding.  She underwent an upper endoscopy by Dr. Ewing Schlein on 02/20/2013. Stenosis was noted at the gastrojejunal anastomosis and this was dilated.  She was seen in family medicine on 01/20/2013 and cervical "adenopathy "was noted. Ms. Gunnerson has not noticed cervical lymph nodes.  Objective:  Vital signs in last 24 hours:  Blood pressure 115/58, pulse 91, temperature 98.9 F (37.2 C), temperature source Oral, resp. rate 18, height 5\' 4"  (1.626 m), weight 101 lb (45.813 kg).    HEENT: Oropharynx without visible mass, tonsils appear normal. Neck without mass Lymphatics: No cervical or supraclavicular nodes Resp: Lungs clear bilaterally Cardio: Regular rate and rhythm GI: No hepatosplenomegaly, nontender, no mass Vascular: No leg edema   Lab Results:  Lab Results  Component Value Date   WBC 4.6 04/06/2013   HGB 11.8 04/06/2013   HCT 35.5 04/06/2013   MCV 99.2 04/06/2013   PLT 249 04/06/2013   ANC 2.5   Ferritin 44 Ferritin 113 on 01/05/2013   Medications: I have reviewed the patient's current medications.  Assessment/Plan: 1. Chronic anemia secondary to gastrointestinal blood loss and iron deficiency, she last received IV iron in March of 2014 2. History of kidney stones, followed by Dr. Wilson Singer. 3. Gram-negative sepsis and septic shock syndrome April 2008. 4. History of acute renal failure secondary to obstruction and septic shock. 5. History of adrenal insufficiency, now maintained off of steroid hormone replacement. She is followed by Dr. Everardo All. 6. Questionable allergic to Venofer in June 2010. She reported an elevated blood pressure and "dizziness" on the day following the Venofer therapy. She has tolerated iron dextran without an apparent reaction. 7. History of a left forearm  fracture.  8. Intermittent low-serum bicarbonate level, ? related to diarrhea.       9. Dysphagia, improved with erythromycin and esophageal dilatation procedures , status post dilatation of the gastric jejunal anastomosis in July of 2014      Disposition:  The hemoglobin and ferritin are lower today, but the hemoglobin remains in the low normal range. We will contact her to discuss the indication for IV iron therapy. She has felt better after IV iron when the hemoglobin/ferritin are dropping. She will return for a lab visit in 3 months. She is scheduled for a 6 month office visit.   Thornton Papas, MD  04/06/2013  3:16 PM

## 2013-04-07 ENCOUNTER — Telehealth: Payer: Self-pay | Admitting: *Deleted

## 2013-04-07 NOTE — Telephone Encounter (Signed)
Left message on voicemail for pt to call office. (Ferritin is 44. Follow up in 3 mos as scheduled, per Dr. Truett Perna.) Pt returned call, results given. Pt is uncomfortable waiting 3 mos. States she is symptomatic and would like to proceed with IV iron. Reviewed with Dr. Truett Perna, OK to schedule iron infusion.

## 2013-04-10 ENCOUNTER — Telehealth: Payer: Self-pay | Admitting: Oncology

## 2013-04-10 ENCOUNTER — Telehealth: Payer: Self-pay | Admitting: *Deleted

## 2013-04-10 NOTE — Telephone Encounter (Signed)
Per staff message and POF I have scheduled appts.  JMW  

## 2013-04-13 ENCOUNTER — Other Ambulatory Visit: Payer: Self-pay | Admitting: Oncology

## 2013-04-13 ENCOUNTER — Ambulatory Visit (HOSPITAL_BASED_OUTPATIENT_CLINIC_OR_DEPARTMENT_OTHER): Payer: Medicare Other

## 2013-04-13 VITALS — BP 91/56 | HR 79 | Temp 97.2°F | Resp 18

## 2013-04-13 DIAGNOSIS — D509 Iron deficiency anemia, unspecified: Secondary | ICD-10-CM

## 2013-04-13 MED ORDER — DIPHENHYDRAMINE HCL 25 MG PO TABS
25.0000 mg | ORAL_TABLET | Freq: Once | ORAL | Status: AC
  Administered 2013-04-13: 25 mg via ORAL
  Filled 2013-04-13: qty 1

## 2013-04-13 MED ORDER — ACETAMINOPHEN 325 MG PO TABS
650.0000 mg | ORAL_TABLET | Freq: Once | ORAL | Status: AC
  Administered 2013-04-13: 650 mg via ORAL

## 2013-04-13 MED ORDER — DIPHENHYDRAMINE HCL 25 MG PO CAPS
ORAL_CAPSULE | ORAL | Status: AC
  Filled 2013-04-13: qty 1

## 2013-04-13 MED ORDER — SODIUM CHLORIDE 0.9 % IV SOLN
1400.0000 mg | Freq: Once | INTRAVENOUS | Status: AC
  Administered 2013-04-13: 1400 mg via INTRAVENOUS
  Filled 2013-04-13: qty 28

## 2013-04-13 MED ORDER — SODIUM CHLORIDE 0.9 % IV SOLN
Freq: Once | INTRAVENOUS | Status: AC
  Administered 2013-04-13: 10:00:00 via INTRAVENOUS

## 2013-04-13 MED ORDER — SODIUM CHLORIDE 0.9 % IV SOLN
50.0000 mg | Freq: Once | INTRAVENOUS | Status: AC
  Administered 2013-04-13: 50 mg via INTRAVENOUS
  Filled 2013-04-13: qty 1

## 2013-04-13 MED ORDER — ACETAMINOPHEN 325 MG PO TABS
ORAL_TABLET | ORAL | Status: AC
  Filled 2013-04-13: qty 2

## 2013-04-13 NOTE — Patient Instructions (Signed)

## 2013-04-24 ENCOUNTER — Encounter: Payer: Self-pay | Admitting: Nurse Practitioner

## 2013-04-24 ENCOUNTER — Ambulatory Visit (INDEPENDENT_AMBULATORY_CARE_PROVIDER_SITE_OTHER): Payer: Medicare Other | Admitting: Nurse Practitioner

## 2013-04-24 VITALS — BP 118/72 | HR 84 | Temp 97.1°F | Ht 64.0 in | Wt 103.0 lb

## 2013-04-24 DIAGNOSIS — D649 Anemia, unspecified: Secondary | ICD-10-CM

## 2013-04-24 DIAGNOSIS — E039 Hypothyroidism, unspecified: Secondary | ICD-10-CM | POA: Insufficient documentation

## 2013-04-24 DIAGNOSIS — K219 Gastro-esophageal reflux disease without esophagitis: Secondary | ICD-10-CM

## 2013-04-24 DIAGNOSIS — R600 Localized edema: Secondary | ICD-10-CM

## 2013-04-24 DIAGNOSIS — F411 Generalized anxiety disorder: Secondary | ICD-10-CM

## 2013-04-24 DIAGNOSIS — R609 Edema, unspecified: Secondary | ICD-10-CM

## 2013-04-24 DIAGNOSIS — K912 Postsurgical malabsorption, not elsewhere classified: Secondary | ICD-10-CM

## 2013-04-24 DIAGNOSIS — L719 Rosacea, unspecified: Secondary | ICD-10-CM | POA: Insufficient documentation

## 2013-04-24 DIAGNOSIS — E876 Hypokalemia: Secondary | ICD-10-CM

## 2013-04-24 MED ORDER — ALPRAZOLAM 0.5 MG PO TABS: ORAL_TABLET | ORAL | Status: DC

## 2013-04-24 NOTE — Progress Notes (Signed)
  Subjective:    Patient ID: DESHANNA KAMA, female    DOB: Dec 12, 1950, 62 y.o.   MRN: [id number]  HPI  Patient here today for follow up: Anemia secondary to gastrectomy Patient has had 3 gastrectomies over th elast 20 years which has caused a malabsorption problem- has to take IV iron Q 5-6 months- She just had done last week- Dr. Myrle Sheng GERD Sees Dr. Ewing Schlein- 3 x a year- recently had ballon dilitation of stomach- narrowing from gastrectomy Hypothyroidism Doing well on current meds- no complaints  Rosacea Metrogel- sees Dr. Hortense Ramal- currently under control Kidney stones Patient hasn't been taking- Dr. Annabell Howells started her on this to prevent kidney stones but patient not taking GAD Patient currently taking xanax- takes care of her mother and takes 3 x a day- last rx not written correctly. Malabsorption problems due to gastrectomy Takes a lot of supplements but not sure she is absorbing everything she needs  Review of Systems  Constitutional: Negative.   HENT: Negative.   Eyes: Negative.   Respiratory: Negative.   Cardiovascular: Negative.   Gastrointestinal: Negative.   Endocrine: Negative.   Genitourinary: Negative.   Musculoskeletal: Negative.   Allergic/Immunologic: Negative.   Neurological: Negative.   Hematological: Negative.   Psychiatric/Behavioral: Positive for agitation. Negative for suicidal ideas and sleep disturbance. The patient is nervous/anxious.   All other systems reviewed and are negative.       Objective:   Physical Exam  Constitutional: She is oriented to person, place, and time.  Very thin  HENT:  Head: Normocephalic.  Right Ear: External ear normal.  Left Ear: External ear normal.  Nose: Nose normal.  Mouth/Throat: Oropharynx is clear and moist.  Eyes: EOM are normal. Pupils are equal, round, and reactive to light.  Neck: Normal range of motion. Neck supple.  Cardiovascular: Normal rate, regular rhythm and normal heart sounds.   Pulmonary/Chest:  Effort normal and breath sounds normal.  Abdominal: Soft. Bowel sounds are normal.  Musculoskeletal: Normal range of motion.  Neurological: She is alert and oriented to person, place, and time.  Skin: Skin is warm and dry.  Psychiatric: She has a normal mood and affect. Her behavior is normal. Judgment and thought content normal.   BP 118/72  Pulse 84  Temp(Src) 97.1 F (36.2 C) (Oral)  Ht 5\' 4"  (1.626 m)  Wt 103 lb (46.72 kg)  BMI 17.67 kg/m2        Assessment & Plan:   1. Hypothyroidism   2. GERD (gastroesophageal reflux disease)   3. Hypokalemia   4. Rosacea   5. Anemia, unspecified   6. Unspecified hypothyroidism   7. Peripheral edema   8. GAD (generalized anxiety disorder)   9. Intestinal malabsorption following gastrectomy    Orders Placed This Encounter  Procedures  . CMP14+EGFR  . NMR, lipoprofile  . Vitamin B12  . Vit D  25 hydroxy (rtn osteoporosis monitoring)   Meds ordered this encounter  Medications  . ALPRAZolam (XANAX) 0.5 MG tablet    Sig: 1/2 - 1 q 8-12 hrs PRN    Dispense:  90 tablet    Refill:  1    Order Specific Question:  Supervising Provider    Answer:  Deborra Medina   Continue all other meds Labs pending Drink ensure daily for added calories Health maintenance reviewed  Mary-Margaret Daphine Deutscher, FNP

## 2013-04-24 NOTE — Addendum Note (Signed)
Addended by: Bennie Pierini on: 04/24/2013 10:37 AM   Modules accepted: Orders

## 2013-04-24 NOTE — Patient Instructions (Signed)

## 2013-04-26 LAB — NMR, LIPOPROFILE
HDL Cholesterol by NMR: 58 mg/dL (ref >=40)
LDL Particle Number: 842 nmol/L (ref <1000)
LDL Size: 21.3 nm (ref >20.5)
Small LDL Particle Number: 206 nmol/L (ref <=527)
Triglycerides by NMR: 151 mg/dL — ABNORMAL HIGH (ref <150)

## 2013-04-26 LAB — CMP14+EGFR
ALT: 12 IU/L (ref 0–32)
AST: 21 IU/L (ref 0–40)
Calcium: 8.7 mg/dL (ref 8.6–10.2)
Chloride: 113 mmol/L — ABNORMAL HIGH (ref 97–108)
Glucose: 90 mg/dL (ref 65–99)
Potassium: 5.1 mmol/L (ref 3.5–5.2)
Sodium: 145 mmol/L — ABNORMAL HIGH (ref 134–144)
Total Bilirubin: 0.1 mg/dL (ref 0.0–1.2)
Total Protein: 5.3 g/dL — ABNORMAL LOW (ref 6.0–8.5)

## 2013-04-26 LAB — VITAMIN D 25 HYDROXY (VIT D DEFICIENCY, FRACTURES): Vit D, 25-Hydroxy: 38.1 ng/mL (ref 30.0–100.0)

## 2013-04-26 LAB — VITAMIN B12: Vitamin B-12: >1999 pg/mL — ABNORMAL HIGH (ref 211–946)

## 2013-04-27 ENCOUNTER — Telehealth: Payer: Self-pay | Admitting: Nurse Practitioner

## 2013-04-27 NOTE — Telephone Encounter (Signed)
Copy of labs mailed  

## 2013-06-08 ENCOUNTER — Ambulatory Visit (INDEPENDENT_AMBULATORY_CARE_PROVIDER_SITE_OTHER): Payer: Medicare Other

## 2013-06-08 ENCOUNTER — Encounter: Payer: Self-pay | Admitting: General Practice

## 2013-06-08 ENCOUNTER — Ambulatory Visit (INDEPENDENT_AMBULATORY_CARE_PROVIDER_SITE_OTHER): Payer: Medicare Other | Admitting: General Practice

## 2013-06-08 VITALS — BP 135/73 | HR 78 | Temp 97.2°F | Ht 64.0 in | Wt 100.5 lb

## 2013-06-08 DIAGNOSIS — M7989 Other specified soft tissue disorders: Secondary | ICD-10-CM

## 2013-06-08 DIAGNOSIS — S40029A Contusion of unspecified upper arm, initial encounter: Secondary | ICD-10-CM

## 2013-06-08 DIAGNOSIS — Z23 Encounter for immunization: Secondary | ICD-10-CM

## 2013-06-08 DIAGNOSIS — S40022A Contusion of left upper arm, initial encounter: Secondary | ICD-10-CM

## 2013-06-08 LAB — POCT INR: INR: 0.9

## 2013-06-08 NOTE — Patient Instructions (Signed)
Hematoma A hematoma is a collection of blood under the skin, in an organ, in a body space, in a joint space, or in other tissue. The blood can clot to form a lump that you can see and feel. The lump is often firm and may sometimes become sore and tender. Most hematomas get better in a few days to weeks. However, some hematomas may be serious and require medical care. Hematomas can range in size from very small to very large. CAUSES  A hematoma can be caused by a blunt or penetrating injury. It can also be caused by spontaneous leakage from a blood vessel under the skin. Spontaneous leakage from a blood vessel is more likely to occur in older people, especially those taking blood thinners. Sometimes, a hematoma can develop after certain medical procedures. SIGNS AND SYMPTOMS   A firm lump on the body.  Possible pain and tenderness in the area.  Bruising.Blue, dark blue, purple-red, or yellowish skin may appear at the site of the hematoma if the hematoma is close to the surface of the skin. For hematomas in deeper tissues or body spaces, the signs and symptoms may be subtle. For example, an intra-abdominal hematoma may cause abdominal pain, weakness, fainting, and shortness of breath. An intracranial hematoma may cause a headache or symptoms such as weakness, trouble speaking, or a change in consciousness. DIAGNOSIS  A hematoma can usually be diagnosed based on your medical history and a physical exam. Imaging tests may be needed if your health care provider suspects a hematoma in deeper tissues or body spaces, such as the abdomen, head, or chest. These tests may include ultrasonography or a CT scan.  TREATMENT  Hematomas usually go away on their own over time. Rarely does the blood need to be drained out of the body. Large hematomas or those that may affect vital organs will sometimes need surgical drainage or monitoring. HOME CARE INSTRUCTIONS   Apply ice to the injured area:   Put ice in a  plastic bag.   Place a towel between your skin and the bag.   Leave the ice on for 20 minutes, 2 3 times a day for the first 1 to 2 days.   After the first 2 days, switch to using warm compresses on the hematoma.   Elevate the injured area to help decrease pain and swelling. Wrapping the area with an elastic bandage may also be helpful. Compression helps to reduce swelling and promotes shrinking of the hematoma. Make sure the bandage is not wrapped too tight.   If your hematoma is on a lower extremity and is painful, crutches may be helpful for a couple days.   Only take over-the-counter or prescription medicines as directed by your health care provider. SEEK IMMEDIATE MEDICAL CARE IF:   You have increasing pain, or your pain is not controlled with medicine.   You have a fever.   You have worsening swelling or discoloration.   Your skin over the hematoma breaks or starts bleeding.   Your hematoma is in your chest or abdomen and you have weakness, shortness of breath, or a change in consciousness.  Your hematoma is on your scalp (caused by a fall or injury) and you have a worsening headache or a change in alertness or consciousness. MAKE SURE YOU:   Understand these instructions.  Will watch your condition.  Will get help right away if you are not doing well or get worse. Document Released: 03/03/2004 Document Revised: 03/22/2013 Document Reviewed:   12/28/2012 [employer] Patient Information 2014 [employer]  

## 2013-06-08 NOTE — Progress Notes (Signed)
  Subjective:    Patient ID: Brittany Hensley, female    DOB: [birth year], 62 y.o.   MRN: [id number]  HPI Patient presents today for left lower arm swelling/bruising. She reports noticing the swelling/bruising yesterday. Reports moving items and cleaning in her parents attic. While doing so, reports using her posterior forearms to push and move heavy items. Reports also the possibility of coming in contact with rusty nails. Although; denies known injury.     Review of Systems  Constitutional: Negative for fever and chills.  Respiratory: Negative for chest tightness and shortness of breath.   Cardiovascular: Negative for chest pain and palpitations.  Skin:       Left forearm swelling and bruising  Neurological: Negative for dizziness, weakness and headaches.       Objective:   Physical Exam  Constitutional: She is oriented to person, place, and time. She appears well-developed and well-nourished.  Cardiovascular: Normal rate, regular rhythm and normal heart sounds.   Pulmonary/Chest: Effort normal and breath sounds normal. No respiratory distress. She exhibits no tenderness.  Musculoskeletal: She exhibits edema. She exhibits no tenderness.  Left posterior forearm hematoma, 4 inches x 2 inches, bluish/purple. Negative drainage.   Neurological: She is alert and oriented to person, place, and time.  Skin: Skin is warm and dry.  Psychiatric: She has a normal mood and affect.      [facility] reading (PRIMARY) by Coralie Keens, FNP-C, left lower arm edema.                                     Assessment & Plan:  1. Left arm swelling  - DG Forearm Left; Future  2. Need for diphtheria-tetanus-pertussis (Tdap) vaccine  - Tdap vaccine greater than or equal to 7yo IM  3. Hematoma of arm, left, initial encounter  - POCT INR -RTO if symptoms worsen -Patient verbalized understanding -Coralie Keens, FNP-C

## 2013-06-30 ENCOUNTER — Telehealth: Payer: Self-pay | Admitting: Nurse Practitioner

## 2013-06-30 NOTE — Telephone Encounter (Signed)
She has appt with mmm per lisa s

## 2013-06-30 NOTE — Telephone Encounter (Signed)
appt made

## 2013-07-03 ENCOUNTER — Encounter: Payer: Self-pay | Admitting: Nurse Practitioner

## 2013-07-03 ENCOUNTER — Ambulatory Visit (INDEPENDENT_AMBULATORY_CARE_PROVIDER_SITE_OTHER): Payer: Medicare Other | Admitting: Nurse Practitioner

## 2013-07-03 ENCOUNTER — Other Ambulatory Visit: Payer: Self-pay

## 2013-07-03 VITALS — BP 110/62 | HR 98 | Temp 97.7°F | Ht 64.0 in | Wt 100.0 lb

## 2013-07-03 DIAGNOSIS — H6123 Impacted cerumen, bilateral: Secondary | ICD-10-CM

## 2013-07-03 DIAGNOSIS — H6120 Impacted cerumen, unspecified ear: Secondary | ICD-10-CM

## 2013-07-03 DIAGNOSIS — S5012XD Contusion of left forearm, subsequent encounter: Secondary | ICD-10-CM

## 2013-07-03 DIAGNOSIS — S5010XA Contusion of unspecified forearm, initial encounter: Secondary | ICD-10-CM

## 2013-07-03 DIAGNOSIS — Z5189 Encounter for other specified aftercare: Secondary | ICD-10-CM

## 2013-07-03 DIAGNOSIS — F411 Generalized anxiety disorder: Secondary | ICD-10-CM

## 2013-07-03 MED ORDER — ALPRAZOLAM 0.5 MG PO TABS: ORAL_TABLET | ORAL | Status: DC

## 2013-07-03 NOTE — Telephone Encounter (Signed)
Please call in xanax rx 

## 2013-07-03 NOTE — Telephone Encounter (Signed)
Last seen 06/08/13  Brittany Hensley  If approved route to nurse to call [facility]

## 2013-07-03 NOTE — Patient Instructions (Signed)

## 2013-07-03 NOTE — Progress Notes (Signed)
   Subjective:    Patient ID: MAKYLAH BOSSARD, female    DOB: 05-May-1951, 62 y.o.   MRN: [id number]  HPI Patient was seen 2 weeks ago with left arm pain and swelling- she had been moving things in an attic and not sure if she had injured it. Was told to take oTC ibuprofen. Still has an knot on her arm but has gone down a lot.  -Ears feel stuff like there is cotton in her ears.  Review of Systems  Constitutional: Negative.   HENT: Negative for congestion, ear discharge and ear pain.   Eyes: Negative.   Respiratory: Negative.   Cardiovascular: Negative.   Gastrointestinal: Negative.   Endocrine: Negative.   Genitourinary: Negative.   Musculoskeletal: Negative.   Skin: Negative.   Hematological: Negative.   Psychiatric/Behavioral: Negative.        Objective:   Physical Exam  Constitutional: She appears well-developed and well-nourished.  HENT:  bil cerumen impaction  Cardiovascular: Normal rate, regular rhythm and normal heart sounds.   Pulmonary/Chest: Effort normal and breath sounds normal.  Skin:  3cm raised lesion left forearm- slightly tender to touch.   Procedure: Lidocaine 1% with epi - 2ml local #11 blade Extracted large blood clot dsg applied       Assessment & Plan:   1. Forearm contusion, left, subsequent encounter   2. Cerumen impaction, bilateral    Keep wound clean and dry Continue all meds Labs pending Diet and exercise encouraged Health maintenance reviewed Follow up in 3 months  Mary-Margaret Daphine Deutscher, FNP

## 2013-07-06 ENCOUNTER — Other Ambulatory Visit (HOSPITAL_BASED_OUTPATIENT_CLINIC_OR_DEPARTMENT_OTHER): Payer: Medicare Other

## 2013-07-06 DIAGNOSIS — D649 Anemia, unspecified: Secondary | ICD-10-CM

## 2013-07-06 LAB — CBC WITH DIFFERENTIAL/PLATELET
Basophils Absolute: 0.1 10*3/uL (ref 0.0–0.1)
Eosinophils Absolute: 0.2 10*3/uL (ref 0.0–0.5)
HGB: 12.7 g/dL (ref 11.6–15.9)
LYMPH%: 20.6 % (ref 14.0–49.7)
MCH: 33.9 pg (ref 25.1–34.0)
MCV: 102.5 fL — ABNORMAL HIGH (ref 79.5–101.0)
MONO#: 0.7 10*3/uL (ref 0.1–0.9)
MONO%: 9.6 % (ref 0.0–14.0)
NEUT#: 4.5 10*3/uL (ref 1.5–6.5)
NEUT%: 65.9 % (ref 38.4–76.8)
Platelets: 279 10*3/uL (ref 145–400)
RBC: 3.73 10*6/uL (ref 3.70–5.45)
RDW: 14.3 % (ref 11.2–14.5)
WBC: 6.8 10*3/uL (ref 3.9–10.3)

## 2013-07-06 LAB — FERRITIN CHCC: Ferritin: 152 ng/ml (ref 9–269)

## 2013-07-07 ENCOUNTER — Telehealth: Payer: Self-pay | Admitting: *Deleted

## 2013-07-07 NOTE — Telephone Encounter (Signed)
Left message at home # for pt to call office. Lab results are normal, per Dr. Truett Perna.

## 2013-07-07 NOTE — Telephone Encounter (Signed)
Message copied by Caleb Popp on Fri Jul 07, 2013 11:20 AM ------      Message from: Thornton Papas B      Created: Thu Jul 06, 2013  8:27 PM       Please call patient, hb and ferritin are ok, f/u as scheduled ------

## 2013-07-19 ENCOUNTER — Telehealth: Payer: Self-pay | Admitting: Oncology

## 2013-07-19 NOTE — Telephone Encounter (Signed)
Sent labs to patient.

## 2013-07-31 ENCOUNTER — Other Ambulatory Visit: Payer: Self-pay

## 2013-07-31 DIAGNOSIS — D649 Anemia, unspecified: Secondary | ICD-10-CM

## 2013-07-31 DIAGNOSIS — F411 Generalized anxiety disorder: Secondary | ICD-10-CM

## 2013-07-31 MED ORDER — LEVOTHYROXINE SODIUM 88 MCG PO TABS: 88.0000 ug | ORAL_TABLET | Freq: Every day | ORAL | Status: DC

## 2013-07-31 NOTE — Telephone Encounter (Signed)
Last seen 07/03/13  MMM  If approved route to nurse to call into [facility] 

## 2013-08-08 NOTE — Telephone Encounter (Signed)
No note on order- do not know what she wants

## 2013-08-22 ENCOUNTER — Other Ambulatory Visit: Payer: Self-pay

## 2013-08-22 DIAGNOSIS — F411 Generalized anxiety disorder: Secondary | ICD-10-CM

## 2013-08-22 MED ORDER — ALPRAZOLAM 0.5 MG PO TABS: ORAL_TABLET | ORAL | Status: DC

## 2013-08-22 NOTE — Telephone Encounter (Signed)
Last seen 07/03/13  MMM  If approved route to nurse to call into [facility]

## 2013-08-22 NOTE — Telephone Encounter (Signed)
Please call in xanax rx 

## 2013-08-22 NOTE — Telephone Encounter (Signed)
Called in.

## 2013-09-09 ENCOUNTER — Encounter: Payer: Self-pay | Admitting: Family Medicine

## 2013-09-09 ENCOUNTER — Ambulatory Visit (INDEPENDENT_AMBULATORY_CARE_PROVIDER_SITE_OTHER): Payer: Medicare Other | Admitting: Family Medicine

## 2013-09-09 VITALS — BP 125/71 | HR 85 | Temp 97.1°F | Ht 64.0 in | Wt 109.0 lb

## 2013-09-09 DIAGNOSIS — J329 Chronic sinusitis, unspecified: Secondary | ICD-10-CM

## 2013-09-09 DIAGNOSIS — J309 Allergic rhinitis, unspecified: Secondary | ICD-10-CM

## 2013-09-09 MED ORDER — FLUTICASONE PROPIONATE 50 MCG/ACT NA SUSP: 2.0000 | Freq: Every day | NASAL | Status: DC

## 2013-09-09 MED ORDER — AMOXICILLIN 500 MG PO CAPS: 500.0000 mg | ORAL_CAPSULE | Freq: Three times a day (TID) | ORAL | Status: DC

## 2013-09-09 MED ORDER — METHYLPREDNISOLONE ACETATE 80 MG/ML IJ SUSP
60.0000 mg | Freq: Once | INTRAMUSCULAR | Status: AC
  Administered 2013-09-09: 60 mg via INTRAMUSCULAR

## 2013-09-09 NOTE — Addendum Note (Signed)
Addended by: Zannie Cove on: 09/09/2013 09:40 AM   Modules accepted: Orders

## 2013-09-09 NOTE — Progress Notes (Signed)
Subjective:    Patient ID: Brittany Hensley, female    DOB: 1951-04-15, 63 y.o.   MRN: [id number]  HPI Patient here today for sinus problems and ears stopped up.      Patient Active Problem List   Diagnosis Date Noted  . Hypothyroidism 04/24/2013  . GERD (gastroesophageal reflux disease) 04/24/2013  . Hypokalemia 04/24/2013  . Rosacea 04/24/2013  . Iron deficiency anemia, unspecified 06/09/2012  . Anemia, unspecified 07/15/2011  . Gastric outlet obstruction 06/16/2011  . AV NODAL REENTRY TACHYCARDIA 03/11/2010  . TACHYCARDIA 03/11/2010  . MURMUR 03/11/2010  . SHORTNESS OF BREATH 03/11/2010  . UNSPECIFIED DISORDER OF ADRENAL GLANDS 02/14/2008  . PERSONAL HISTORY OF URINARY CALCULI 02/14/2008   Outpatient Encounter Prescriptions as of 09/09/2013  Medication Sig  . ALPRAZolam (XANAX) 0.5 MG tablet 1/2 - 1 q 8-12 hrs PRN  . CALCIUM CITRATE PO Take 600 mg by mouth 3 (three) times daily.  . cholecalciferol (VITAMIN D) 1000 UNITS tablet Take 1,000 Units by mouth daily.    . ergocalciferol (DRISDOL) 8000 UNIT/ML drops Place 4,000 Units under the tongue daily.  . ERY-TAB 250 MG EC tablet Take 1 tablet by mouth at bedtime.   . fish oil-omega-3 fatty acids 1000 MG capsule Take 1 g by mouth daily.    . furosemide (LASIX) 20 MG tablet Take 1 tablet (20 mg total) by mouth 2 (two) times daily.  Marland Kitchen levothyroxine (SYNTHROID, LEVOTHROID) 88 MCG tablet Take 1 tablet (88 mcg total) by mouth daily before breakfast.  . loratadine (CLARITIN) 10 MG tablet Take 10 mg by mouth 2 (two) times daily.    . magnesium gluconate (MAGONATE) 500 MG tablet Take 250 mg by mouth 2 (two) times daily.    . metroNIDAZOLE (METROGEL) 1 % gel Apply 1 application topically daily.  . Multiple Vitamin (MULTIVITAMIN) capsule Take 1 capsule by mouth 2 (two) times daily.    Marland Kitchen omeprazole (PRILOSEC) 40 MG capsule Take 1 capsule (40 mg total) by mouth daily.  . potassium chloride (KLOR-CON 10) 10 MEQ tablet Take 1 tablet (10 mEq  total) by mouth 2 (two) times daily.  . sucralfate (CARAFATE) 1 G tablet Take 1 tablet by mouth 2 (two) times daily.  . vitamin C (ASCORBIC ACID) 500 MG tablet Take 500 mg by mouth 2 (two) times daily.      Review of Systems  Constitutional: Negative.  Negative for fever.  HENT: Positive for ear pain (feels stopped up), postnasal drip and sinus pressure.   Eyes: Negative.   Respiratory: Positive for cough (little).   Cardiovascular: Negative.   Gastrointestinal: Negative.   Endocrine: Negative.   Genitourinary: Negative.   Musculoskeletal: Negative.   Skin: Negative.   Allergic/Immunologic: Negative.   Neurological: Negative.   Hematological: Negative.   Psychiatric/Behavioral: Negative.        Objective:   Physical Exam  Nursing note and vitals reviewed. Constitutional: She is oriented to person, place, and time. No distress.  Thin some what frail appearing for her age  HENT:  Head: Normocephalic and atraumatic.  Right Ear: External ear normal.  Left Ear: External ear normal.  Mouth/Throat: Oropharynx is clear and moist.  Nasal congestion bilaterally  Eyes: Conjunctivae and EOM are normal. Pupils are equal, round, and reactive to light. Right eye exhibits no discharge. Left eye exhibits no discharge. No scleral icterus.  Neck: Normal range of motion. Neck supple. No JVD present. No thyromegaly present.  Cardiovascular: Normal rate, regular rhythm, normal heart sounds and intact  distal pulses.   Pulmonary/Chest: Effort normal and breath sounds normal. No respiratory distress. She has no wheezes. She has no rales. She exhibits no tenderness.  Dry cough  Abdominal: Soft. Bowel sounds are normal. She exhibits no mass. There is no tenderness. There is no rebound and no guarding.  Musculoskeletal: Normal range of motion. She exhibits no edema.  Lymphadenopathy:    She has no cervical adenopathy.  Neurological: She is alert and oriented to person, place, and time.  Skin: Skin is  warm and dry. No rash noted.  Psychiatric: She has a normal mood and affect. Her behavior is normal. Judgment and thought content normal.   Impacted ear cerumen removed from left ear canal with a curette  BP 125/71  Pulse 85  Temp(Src) 97.1 F (36.2 C) (Oral)  Ht 5\' 4"  (1.626 m)  Wt 109 lb (49.442 kg)  BMI 18.70 kg/m2        Assessment & Plan:   1. Allergic rhinitis - fluticasone (FLONASE) 50 MCG/ACT nasal spray; Place 2 sprays into both nostrils daily.  Dispense: 16 g; Refill: 6  2. Rhinosinusitis - amoxicillin (AMOXIL) 500 MG capsule; Take 1 capsule (500 mg total) by mouth 3 (three) times daily.  Dispense: 30 capsule; Refill: 0  3. Impacted ear cerumen left ear canal removed without problem  4. History of GI stricture, seeing gastroenterologist in the coming week  Patient Instructions  Take medication as directed Use nose spray as directed Use saline nose spray frequently through the day and a prescription nose spray at night   Arrie Senate MD

## 2013-09-09 NOTE — Patient Instructions (Signed)
Take medication as directed Use nose spray as directed Use saline nose spray frequently through the day and a prescription nose spray at night

## 2013-09-15 ENCOUNTER — Other Ambulatory Visit: Payer: Self-pay | Admitting: Gastroenterology

## 2013-09-15 NOTE — Addendum Note (Signed)
Addended byClarene Essex on: 09/15/2013 10:55 AM   Modules accepted: Orders

## 2013-09-18 ENCOUNTER — Telehealth: Payer: Self-pay | Admitting: Family Medicine

## 2013-09-18 DIAGNOSIS — J329 Chronic sinusitis, unspecified: Secondary | ICD-10-CM

## 2013-09-18 DIAGNOSIS — J310 Chronic rhinitis: Secondary | ICD-10-CM

## 2013-09-18 MED ORDER — AMOXICILLIN 500 MG PO CAPS: 500.0000 mg | ORAL_CAPSULE | Freq: Three times a day (TID) | ORAL | Status: DC

## 2013-09-19 NOTE — Telephone Encounter (Signed)
Patient aware rx at [facility]

## 2013-09-20 ENCOUNTER — Encounter (HOSPITAL_COMMUNITY): Payer: Self-pay | Admitting: *Deleted

## 2013-09-21 ENCOUNTER — Other Ambulatory Visit: Payer: Self-pay

## 2013-09-21 DIAGNOSIS — F411 Generalized anxiety disorder: Secondary | ICD-10-CM

## 2013-09-21 MED ORDER — ALPRAZOLAM 0.5 MG PO TABS: ORAL_TABLET | ORAL | Status: DC

## 2013-09-21 NOTE — Telephone Encounter (Signed)
Called in.

## 2013-09-21 NOTE — Telephone Encounter (Signed)
Please call in xanax with 1 refills 

## 2013-09-21 NOTE — Telephone Encounter (Signed)
Last sen 09/09/13  DWM  If approved route to nurse to call into [facility]

## 2013-09-27 ENCOUNTER — Other Ambulatory Visit: Payer: Self-pay | Admitting: Gastroenterology

## 2013-09-27 NOTE — Addendum Note (Signed)
Addended by: Tejon Gracie on: 09/27/2013 05:03 PM   Modules accepted: Orders  

## 2013-10-03 ENCOUNTER — Encounter (HOSPITAL_COMMUNITY): Payer: Self-pay | Admitting: Gastroenterology

## 2013-10-03 ENCOUNTER — Encounter (HOSPITAL_COMMUNITY)
Admission: RE | Disposition: A | Discharge status: Home / Self Care | Payer: Self-pay | Source: Ambulatory Visit | Attending: Gastroenterology

## 2013-10-03 ENCOUNTER — Ambulatory Visit (HOSPITAL_COMMUNITY)
Admission: RE | Admit: 2013-10-03 | Discharge: 2013-10-03 | Disposition: A | Discharge status: Home / Self Care | Payer: Medicare Other | Source: Ambulatory Visit | Attending: Gastroenterology | Admitting: Gastroenterology

## 2013-10-03 DIAGNOSIS — T182XXA Foreign body in stomach, initial encounter: Secondary | ICD-10-CM | POA: Insufficient documentation

## 2013-10-03 DIAGNOSIS — Y9210 Unspecified residential institution as the place of occurrence of the external cause: Secondary | ICD-10-CM | POA: Insufficient documentation

## 2013-10-03 DIAGNOSIS — R112 Nausea with vomiting, unspecified: Secondary | ICD-10-CM | POA: Insufficient documentation

## 2013-10-03 DIAGNOSIS — K929 Disease of digestive system, unspecified: Secondary | ICD-10-CM | POA: Insufficient documentation

## 2013-10-03 DIAGNOSIS — K5669 Other intestinal obstruction: Secondary | ICD-10-CM | POA: Insufficient documentation

## 2013-10-03 DIAGNOSIS — Y832 Surgical operation with anastomosis, bypass or graft as the cause of abnormal reaction of the patient, or of later complication, without mention of misadventure at the time of the procedure: Secondary | ICD-10-CM | POA: Insufficient documentation

## 2013-10-03 DIAGNOSIS — K56609 Unspecified intestinal obstruction, unspecified as to partial versus complete obstruction: Secondary | ICD-10-CM | POA: Insufficient documentation

## 2013-10-03 DIAGNOSIS — IMO0002 Reserved for concepts with insufficient information to code with codable children: Secondary | ICD-10-CM | POA: Insufficient documentation

## 2013-10-03 DIAGNOSIS — K5730 Diverticulosis of large intestine without perforation or abscess without bleeding: Secondary | ICD-10-CM | POA: Insufficient documentation

## 2013-10-03 HISTORY — DX: Diverticulitis of intestine, part unspecified, without perforation or abscess without bleeding: K57.92

## 2013-10-03 HISTORY — DX: Rosacea, unspecified: L71.9

## 2013-10-03 HISTORY — PX: ESOPHAGOGASTRODUODENOSCOPY: SHX5428

## 2013-10-03 HISTORY — PX: BALLOON DILATION: SHX5330

## 2013-10-03 SURGERY — EGD (ESOPHAGOGASTRODUODENOSCOPY)
Anesthesia: Moderate Sedation

## 2013-10-03 MED ORDER — SODIUM CHLORIDE 0.9 % IV SOLN: INTRAVENOUS | Status: DC

## 2013-10-03 MED ORDER — FENTANYL CITRATE 0.05 MG/ML IJ SOLN
INTRAMUSCULAR | Status: AC
  Filled 2013-10-03: qty 2

## 2013-10-03 MED ORDER — MIDAZOLAM HCL 10 MG/2ML IJ SOLN
INTRAMUSCULAR | Status: DC | PRN
  Administered 2013-10-03 (×5): 2 mg via INTRAVENOUS

## 2013-10-03 MED ORDER — SODIUM CHLORIDE 0.9 % IV SOLN
INTRAVENOUS | Status: DC
  Administered 2013-10-03: 500 mL via INTRAVENOUS

## 2013-10-03 MED ORDER — BUTAMBEN-TETRACAINE-BENZOCAINE 2-2-14 % EX AERO
INHALATION_SPRAY | CUTANEOUS | Status: DC | PRN
  Administered 2013-10-03: 2 via TOPICAL

## 2013-10-03 MED ORDER — FENTANYL CITRATE 0.05 MG/ML IJ SOLN
INTRAMUSCULAR | Status: DC | PRN
  Administered 2013-10-03 (×4): 25 ug via INTRAVENOUS

## 2013-10-03 MED ORDER — MIDAZOLAM HCL 10 MG/2ML IJ SOLN
INTRAMUSCULAR | Status: AC
  Filled 2013-10-03: qty 2

## 2013-10-03 NOTE — Progress Notes (Signed)
Brittany Hensley 12:26 PM  Subjective: Patient with no new complaints since we've seen her in the office but still has periodic nausea vomiting helped in the past by dilation of her anastomosis stricture  Objective: Vital signs stable afebrile no acute distress exam please see pre-assessment evaluation  Assessment: Episodic nausea and vomiting due to anastomotic stricture  Plan: Okay to proceed with endoscopy probable balloon dilatation today  Cesily Cuoco E

## 2013-10-03 NOTE — Discharge Instructions (Addendum)
Esophagogastroduodenoscopy °Care After °Refer to this sheet in the next few weeks. These instructions provide you with information on caring for yourself after your procedure. Your caregiver may also give you more specific instructions. Your treatment has been planned according to current medical practices, but problems sometimes occur. Call your caregiver if you have any problems or questions after your procedure.  °HOME CARE INSTRUCTIONS °· Do not eat or drink anything until the numbing medicine (local anesthetic) has worn off and your gag reflex has returned. You will know that the local anesthetic has worn off when you can swallow comfortably. °· Do not drive for 12 hours after the procedure or as directed by your caregiver. °· Only take medicines as directed by your caregiver. °SEEK MEDICAL CARE IF:  °· You cannot stop coughing. °· You are not urinating at all or less than usual. °SEEK IMMEDIATE MEDICAL CARE IF: °· You have difficulty swallowing. °· You cannot eat or drink. °· You have worsening throat or chest pain. °· You have dizziness, lightheadedness, or you faint. °· You have nausea or vomiting. °· You have chills. °· You have a fever. °· You have severe abdominal pain. °· You have black, tarry, or bloody stools. °Document Released: 07/06/2012 Document Reviewed: 07/06/2012 °[employer]® Patient Information ©2014 [employer]. ° °Conscious Sedation, Adult, Care After °Refer to this sheet in the next few weeks. These instructions provide you with information on caring for yourself after your procedure. Your health care provider may also give you more specific instructions. Your treatment has been planned according to current medical practices, but problems sometimes occur. Call your health care provider if you have any problems or questions after your procedure. °WHAT TO EXPECT AFTER THE PROCEDURE  °After your procedure: °· You may feel sleepy, clumsy, and have poor balance for several hours. °· Vomiting may  occur if you eat too soon after the procedure. °HOME CARE INSTRUCTIONS °· Do not participate in any activities where you could become injured for at least 24 hours. Do not: °· Drive. °· Swim. °· Ride a bicycle. °· Operate heavy machinery. °· Cook. °· Use power tools. °· Climb ladders. °· Work from a high place. °· Do not make important decisions or sign legal documents until you are improved. °· If you vomit, drink water, juice, or soup when you can drink without vomiting. Make sure you have little or no nausea before eating solid foods. °· Only take over-the-counter or prescription medicines for pain, discomfort, or fever as directed by your health care provider. °· Make sure you and your family fully understand everything about the medicines given to you, including what side effects may occur. °· You should not drink alcohol, take sleeping pills, or take medicines that cause drowsiness for at least 24 hours. °· If you smoke, do not smoke without supervision. °· If you are feeling better, you may resume normal activities 24 hours after you were sedated. °· Keep all appointments with your health care provider. °SEEK MEDICAL CARE IF: °· Your skin is pale or bluish in color. °· You continue to feel nauseous or vomit. °· Your pain is getting worse and is not helped by medicine. °· You have bleeding or swelling. °· You are still sleepy or feeling clumsy after 24 hours. °SEEK IMMEDIATE MEDICAL CARE IF: °· You develop a rash. °· You have difficulty breathing. °· You develop any type of allergic problem. °· You have a fever. °MAKE SURE YOU: °· Understand these instructions. °· Will watch your   condition.  Will get help right away if you are not doing well or get worse. Document Released: 05/10/2013 Document Reviewed: 02/24/2013 [facility] Patient Information 2014 [place], [place].  Call if question or problem otherwise liquids only today and advance diet tomorrow

## 2013-10-03 NOTE — Op Note (Signed)
[facility] [place], [address]   ENDOSCOPY PROCEDURE REPORT  PATIENT: Brittany Hensley, Brittany Hensley.  MR#: [id number] BIRTHDATE: 1950/12/19 , 62  yrs. old GENDER: Female  ENDOSCOPIST: Clarene Essex, MD REFERRED UX:NATF Edrick Kins, M.D.  PROCEDURE DATE:  10/03/2013 PROCEDURE:   EGD, diagnostic and dilation of anastomosis ASA CLASS:   Class II INDICATIONS:Nausea and Vomiting.  MEDICATIONS: Fentanyl 100 mcg IV and Versed 10 mg IV  TOPICAL ANESTHETIC:used  DESCRIPTION OF PROCEDURE:   After the risks benefits and alternatives of the procedure were thoroughly explained, informed consent was obtained.  The Pentax Gastroscope [id number]  endoscope was introduced through the mouth and advanced to the gastrojejunostomy but the opening was small and there was lots of old food in the stomach and her deformed anastomosis with multiple diverticuli and scarring was confirmed and it was friable as well and we spent a good bit of time trying to wash and suction the food away she we could get adequate visualization and once we did we were able to advance the wire guided 10-12 mm balloon unfortunate lady the covering Was left on and fell off into the stomach which had to be snared and removed and then we were able to advance the balloon in the customary fashion through the anastomosis and it was inflated to 10 mm and then 11 mm but there was not much resistance to we then inflated to 12 mm for 30 seconds and again there was not much resistance and the balloon was removed and we were easily able to advance the scope into thesecond portion of the duodenum which was normal, limited by Without limitations.  we then proceeded with additional balloon dilatation using the wire-guided12-15 adjustable balloon first and then the15-18 esophageal inflated to 16.5 and we held that for 1 minute The instrument was slowly withdrawn as the mucosa was fully examined.there is no obvious  immediate complication patient tolerated the procedure well         FINDINGS:1.tiny  to Small hiatal hernia2. Increase food in the stomach status post some washed and suctioned 3. Deformed anastomosis with friability and scarring status post balloon dilation to 16.5 mm as above 4. Otherwise within normal limits to the second portion of the duodenum  COMPLICATIONS:none ENDOSCOPIC IMPRESSION:above   RECOMMENDATIONS:observe for delayed complications if none slowly advance diet and call me when necessary otherwise followup in a few months and continue pump inhibitors and periodic IV iron and avoid aspirin and nonsteroidals   REPEAT EXAM: as needed   _______________________________ Clarene Essex, MD eSigned:  Clarene Essex, MD 10/03/2013 1:51 PM    [id number] Benay Spice, MD  PATIENT NAME:  Brittany Hensley, Brittany Hensley. MR#: [id number]

## 2013-10-04 ENCOUNTER — Encounter (HOSPITAL_COMMUNITY): Payer: Self-pay | Admitting: Gastroenterology

## 2013-10-12 ENCOUNTER — Other Ambulatory Visit: Payer: Self-pay | Admitting: Nurse Practitioner

## 2013-10-16 NOTE — Telephone Encounter (Signed)
Please call in xanax with 0 refills 

## 2013-10-16 NOTE — Telephone Encounter (Signed)
Last seen by you 07/03/13, last filled 09/21/13, [facility]

## 2013-10-17 NOTE — Telephone Encounter (Signed)
Called to [facility] 

## 2013-10-23 ENCOUNTER — Ambulatory Visit: Payer: Medicare Other | Admitting: Nurse Practitioner

## 2013-10-24 ENCOUNTER — Ambulatory Visit (INDEPENDENT_AMBULATORY_CARE_PROVIDER_SITE_OTHER): Payer: Medicare Other | Admitting: Nurse Practitioner

## 2013-10-24 ENCOUNTER — Encounter: Payer: Self-pay | Admitting: Nurse Practitioner

## 2013-10-24 VITALS — BP 103/62 | HR 78 | Temp 97.6°F | Ht 65.0 in | Wt 96.6 lb

## 2013-10-24 DIAGNOSIS — R609 Edema, unspecified: Secondary | ICD-10-CM

## 2013-10-24 DIAGNOSIS — D509 Iron deficiency anemia, unspecified: Secondary | ICD-10-CM

## 2013-10-24 DIAGNOSIS — E039 Hypothyroidism, unspecified: Secondary | ICD-10-CM

## 2013-10-24 DIAGNOSIS — R600 Localized edema: Secondary | ICD-10-CM

## 2013-10-24 DIAGNOSIS — F411 Generalized anxiety disorder: Secondary | ICD-10-CM

## 2013-10-24 DIAGNOSIS — D649 Anemia, unspecified: Secondary | ICD-10-CM

## 2013-10-24 DIAGNOSIS — K219 Gastro-esophageal reflux disease without esophagitis: Secondary | ICD-10-CM

## 2013-10-24 DIAGNOSIS — E876 Hypokalemia: Secondary | ICD-10-CM

## 2013-10-24 DIAGNOSIS — L719 Rosacea, unspecified: Secondary | ICD-10-CM

## 2013-10-24 MED ORDER — FUROSEMIDE 20 MG PO TABS: 20.0000 mg | ORAL_TABLET | Freq: Two times a day (BID) | ORAL | Status: DC

## 2013-10-24 MED ORDER — LEVOTHYROXINE SODIUM 88 MCG PO TABS: 88.0000 ug | ORAL_TABLET | Freq: Every day | ORAL | Status: DC

## 2013-10-24 MED ORDER — METRONIDAZOLE 1 % EX GEL: 1.0000 "application " | Freq: Every day | CUTANEOUS | Status: DC

## 2013-10-24 MED ORDER — ALPRAZOLAM 0.5 MG PO TABS: ORAL_TABLET | ORAL | Status: DC

## 2013-10-24 MED ORDER — OMEPRAZOLE 40 MG PO CPDR: 40.0000 mg | DELAYED_RELEASE_CAPSULE | Freq: Every day | ORAL | Status: DC

## 2013-10-24 NOTE — Patient Instructions (Signed)

## 2013-10-24 NOTE — Progress Notes (Signed)
Subjective:    Patient ID: Brittany Hensley, female    DOB: 06/12/1951, 62 y.o.   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 Sherill GERD Sees Dr. Magod- 3 x a year- recently had ballon dilitation of stomach- narrowing from gastrectomy Hypothyroidism Doing well on current meds- no complaints  Rosacea Metrogel- sees Dr. Taffeen- currently under control Kidney stones Patient hasn't been taking- Dr. Wrenn started her on this to prevent kidney stones but patient not taking GAD Patient currently taking xanax- takes care of her mother and takes 3 x a day- last rx not written correctly. Malabsorption problems due to gastrectomy Takes a lot of supplements but not sure she is absorbing everything she needs  Review of Systems  Constitutional: Negative.   HENT: Negative.   Eyes: Negative.   Respiratory: Negative.   Cardiovascular: Negative.   Gastrointestinal: Negative.   Endocrine: Negative.   Genitourinary: Negative.   Musculoskeletal: Negative.   Allergic/Immunologic: Negative.   Neurological: Negative.   Hematological: Negative.   Psychiatric/Behavioral: Positive for agitation. Negative for suicidal ideas and sleep disturbance. The patient is nervous/anxious.   All other systems reviewed and are negative.       Objective:   Physical Exam  Constitutional: She is oriented to person, place, and time.  Very thin  HENT:  Head: Normocephalic.  Right Ear: External ear normal.  Left Ear: External ear normal.  Nose: Nose normal.  Mouth/Throat: Oropharynx is clear and moist.  Eyes: EOM are normal. Pupils are equal, round, and reactive to light.  Neck: Normal range of motion. Neck supple.  Cardiovascular: Normal rate, regular rhythm and normal heart sounds.   Pulmonary/Chest:  Effort normal and breath sounds normal.  Abdominal: Soft. Bowel sounds are normal.  Musculoskeletal: Normal range of motion.  Neurological: She is alert and oriented to person, place, and time.  Skin: Skin is warm and dry.  Psychiatric: She has a normal mood and affect. Her behavior is normal. Judgment and thought content normal.   BP 103/62  Pulse 78  Temp(Src) 97.6 F (36.4 C) (Oral)  Ht 5' 5" (1.651 m)  Wt 96 lb 9.6 oz (43.817 kg)  BMI 16.07 kg/m2        Assessment & Plan:   1. Iron deficiency anemia, unspecified   2. Hypothyroidism   3. Hypokalemia   4. GERD (gastroesophageal reflux disease)   5. Rosacea   6. Anemia, unspecified   7. Peripheral edema   8. GAD (generalized anxiety disorder)    Orders Placed This Encounter  Procedures  . CMP14+EGFR  . NMR, lipoprofile  . Anemia Profile B  . Thyroid Panel With TSH   Meds ordered this encounter  Medications  . levothyroxine (SYNTHROID, LEVOTHROID) 88 MCG tablet    Sig: Take 1 tablet (88 mcg total) by mouth daily before breakfast.    Dispense:  30 tablet    Refill:  5    Order Specific Question:  Supervising Provider    Answer:  MOORE, DONALD W [[id number]]  . omeprazole (PRILOSEC) 40 MG capsule    Sig: Take 1 capsule (40 mg total) by mouth daily.    Dispense:  30 capsule    Refill:  5    Order Specific Question:  Supervising Provider      Answer:  MOORE, DONALD W [[id number]]  . furosemide (LASIX) 20 MG tablet    Sig: Take 1 tablet (20 mg total) by mouth 2 (two) times daily.    Dispense:  30 tablet    Refill:  5    Order Specific Question:  Supervising Provider    Answer:  MOORE, DONALD W [[id number]]  . metroNIDAZOLE (METROGEL) 1 % gel    Sig: Apply 1 application topically daily.    Dispense:  45 g    Refill:  3    Order Specific Question:  Supervising Provider    Answer:  MOORE, DONALD W [[id number]]  . ALPRAZolam (XANAX) 0.5 MG tablet    Sig: TAKE 1/2 TO 1 TABLET EVERY 8 TO 12 HOURSAS NEEDED    Dispense:  90 tablet     Refill:  0    Do not fill until 11/24/13    Order Specific Question:  Supervising Provider    Answer:  MOORE, DONALD W [[id number]]   encouaged to drink protein shakes when can't get enough protein in diet Labs pending Health maintenance reviewed Diet and exercise encouraged Continue all meds Follow up  In 6 months   Mary-Margaret Martin, FNP    

## 2013-10-26 ENCOUNTER — Ambulatory Visit (HOSPITAL_BASED_OUTPATIENT_CLINIC_OR_DEPARTMENT_OTHER): Payer: Medicare Other | Admitting: Oncology

## 2013-10-26 ENCOUNTER — Other Ambulatory Visit (HOSPITAL_BASED_OUTPATIENT_CLINIC_OR_DEPARTMENT_OTHER): Payer: Medicare Other

## 2013-10-26 VITALS — BP 112/65 | HR 78 | Temp 97.7°F | Resp 18 | Ht 65.0 in | Wt 98.6 lb

## 2013-10-26 DIAGNOSIS — K922 Gastrointestinal hemorrhage, unspecified: Secondary | ICD-10-CM

## 2013-10-26 DIAGNOSIS — D649 Anemia, unspecified: Secondary | ICD-10-CM

## 2013-10-26 DIAGNOSIS — D500 Iron deficiency anemia secondary to blood loss (chronic): Secondary | ICD-10-CM

## 2013-10-26 DIAGNOSIS — N289 Disorder of kidney and ureter, unspecified: Secondary | ICD-10-CM

## 2013-10-26 DIAGNOSIS — D509 Iron deficiency anemia, unspecified: Secondary | ICD-10-CM

## 2013-10-26 LAB — ANEMIA PROFILE B
BASOS ABS: 0.1 10*3/uL (ref 0.0–0.2)
Basos: 1 %
Eos: 4 %
Eosinophils Absolute: 0.2 10*3/uL (ref 0.0–0.4)
Ferritin: 131 ng/mL (ref 15–150)
Folate: >19.9 ng/mL (ref >3.0)
HEMATOCRIT: 35.8 % (ref 34.0–46.6)
Hemoglobin: 12 g/dL (ref 11.1–15.9)
IRON: 65 ug/dL (ref 35–155)
Immature Grans (Abs): 0 10*3/uL (ref 0.0–0.1)
Immature Granulocytes: 0 %
Iron Saturation: 27 % (ref 15–55)
LYMPHS ABS: 1.3 10*3/uL (ref 0.7–3.1)
Lymphs: 32 %
MCH: 32.6 pg (ref 26.6–33.0)
MCHC: 33.5 g/dL (ref 31.5–35.7)
MCV: 97 fL (ref 79–97)
Monocytes Absolute: 0.5 10*3/uL (ref 0.1–0.9)
Monocytes: 12 %
Neutrophils Absolute: 2.1 10*3/uL (ref 1.4–7.0)
Neutrophils Relative %: 51 %
Platelets: 238 10*3/uL (ref 150–379)
RBC: 3.68 x10E6/uL — ABNORMAL LOW (ref 3.77–5.28)
RDW: 14.5 % (ref 12.3–15.4)
Retic Ct Pct: 1.4 % (ref 0.6–2.6)
TIBC: 245 ug/dL — AB (ref 250–450)
UIBC: 180 ug/dL (ref 150–375)
Vitamin B-12: >1999 pg/mL — ABNORMAL HIGH (ref 211–946)
WBC: 4 10*3/uL (ref 3.4–10.8)

## 2013-10-26 LAB — CMP14+EGFR
ALBUMIN: 3.7 g/dL (ref 3.6–4.8)
ALK PHOS: 39 IU/L (ref 39–117)
ALT: 14 IU/L (ref 0–32)
AST: 19 IU/L (ref 0–40)
Albumin/Globulin Ratio: 1.9 (ref 1.1–2.5)
BUN / CREAT RATIO: 17 (ref 11–26)
BUN: 21 mg/dL (ref 8–27)
CO2: 17 mmol/L — AB (ref 18–29)
Calcium: 8.9 mg/dL (ref 8.7–10.3)
Chloride: 112 mmol/L — ABNORMAL HIGH (ref 97–108)
Creatinine, Ser: 1.21 mg/dL — ABNORMAL HIGH (ref 0.57–1.00)
GFR calc Af Amer: 55 mL/min/{1.73_m2} — ABNORMAL LOW (ref >59)
GFR calc non Af Amer: 48 mL/min/{1.73_m2} — ABNORMAL LOW (ref >59)
Globulin, Total: 2 g/dL (ref 1.5–4.5)
Glucose: 90 mg/dL (ref 65–99)
Potassium: 4.4 mmol/L (ref 3.5–5.2)
Sodium: 145 mmol/L — ABNORMAL HIGH (ref 134–144)
Total Bilirubin: <0.2 mg/dL (ref 0.0–1.2)
Total Protein: 5.7 g/dL — ABNORMAL LOW (ref 6.0–8.5)

## 2013-10-26 LAB — CBC WITH DIFFERENTIAL/PLATELET
BASO%: 1.1 % (ref 0.0–2.0)
BASOS ABS: 0 10*3/uL (ref 0.0–0.1)
EOS ABS: 0.2 10*3/uL (ref 0.0–0.5)
EOS%: 3.9 % (ref 0.0–7.0)
HCT: 37.4 % (ref 34.8–46.6)
HGB: 12.3 g/dL (ref 11.6–15.9)
LYMPH%: 28.3 % (ref 14.0–49.7)
MCH: 33 pg (ref 25.1–34.0)
MCHC: 32.8 g/dL (ref 31.5–36.0)
MCV: 100.4 fL (ref 79.5–101.0)
MONO#: 0.4 10*3/uL (ref 0.1–0.9)
MONO%: 9.5 % (ref 0.0–14.0)
NEUT%: 57.2 % (ref 38.4–76.8)
NEUTROS ABS: 2.4 10*3/uL (ref 1.5–6.5)
PLATELETS: 256 10*3/uL (ref 145–400)
RBC: 3.72 10*6/uL (ref 3.70–5.45)
RDW: 14.5 % (ref 11.2–14.5)
WBC: 4.2 10*3/uL (ref 3.9–10.3)
lymph#: 1.2 10*3/uL (ref 0.9–3.3)

## 2013-10-26 LAB — THYROID PANEL WITH TSH
Free Thyroxine Index: 1.2 (ref 1.2–4.9)
T3 UPTAKE RATIO: 26 % (ref 24–39)
T4, Total: 4.6 ug/dL (ref 4.5–12.0)
TSH: 1.97 u[IU]/mL (ref 0.450–4.500)

## 2013-10-26 LAB — NMR, LIPOPROFILE
Cholesterol: 183 mg/dL (ref <200)
HDL Cholesterol by NMR: 62 mg/dL (ref >=40)
HDL Particle Number: 39 umol/L (ref >=30.5)
LDL Particle Number: 1065 nmol/L — ABNORMAL HIGH (ref <1000)
LDL Size: 21.5 nm (ref >20.5)
LDLC SERPL CALC-MCNC: 98 mg/dL (ref <100)
LP-IR SCORE: 32 (ref <=45)
Small LDL Particle Number: 469 nmol/L (ref <=527)
Triglycerides by NMR: 115 mg/dL (ref <150)

## 2013-10-26 LAB — FERRITIN CHCC: Ferritin: 82 ng/ml (ref 9–269)

## 2013-10-26 NOTE — Progress Notes (Signed)
  [facility] OFFICE PROGRESS NOTE   Diagnosis:   INTERVAL HISTORY:   She returns for scheduled followup of anemia. She last received IV iron in September 2014. She reports feeling "achy "for several days after the iron. No symptoms of an allergic reaction. She does not feel iron deficient at present.  She had increased dysphagia and was taken to a dilatation of the gastric anastomosis by Dr. Watt Climes on 10/03/2013. The dysphagia has improved. No bleeding.  Objective:  Vital signs in last 24 hours:  Blood pressure 112/65, pulse 78, temperature 97.7 F (36.5 C), temperature source Oral, resp. rate 18, height 5\' 5"  (1.651 m), weight 98 lb 9.6 oz (44.725 kg), SpO2 100.00%.    Resp: Lungs clear bilaterally Cardio: Regular rate and rhythm GI: No hepatosplenomegaly, nontender Vascular: No leg edema   Lab Results:  Lab Results  Component Value Date   WBC 4.2 10/26/2013   HGB 12.3 10/26/2013   HCT 37.4 10/26/2013   MCV 100.4 10/26/2013   PLT 256 10/26/2013   NEUTROABS 2.4 10/26/2013   10/24/2013-ferritin 131, 10/26/2013-ferritin 82 Imaging:  No results found.  Medications: I have reviewed the patient's current medications.  Assessment/Plan: 1. Chronic anemia secondary to gastrointestinal blood loss and iron deficiency, she last received IV iron in September of 2014 2. History of kidney stones, followed by Dr. Roni Bread. 3. Gram-negative sepsis and septic shock syndrome April 2008. 4. History of acute renal failure secondary to obstruction and septic shock. 5. History of adrenal insufficiency, now maintained off of steroid hormone replacement. She is followed by Dr. Loanne Drilling. 6. Questionable allergic to Venofer in June 2010. She reported an elevated blood pressure and "dizziness" on the day following the Venofer therapy. She has tolerated iron dextran without an apparent reaction. 7. History of a left forearm fracture.  8. Intermittent low-serum bicarbonate level, ? related  to diarrhea or renal insufficiency       9.   Dysphagia, improved with erythromycin and esophageal dilatation procedures , status post dilatation of the gastric jejunal anastomosis 10/03/2013   Disposition:  She appears stable. No iron deficiency at present. She will return for a CBC and ferritin level in 3 months. She is scheduled for a 6 month office visit.  Betsy Coder, MD  10/26/2013  4:00 PM

## 2013-10-31 ENCOUNTER — Telehealth: Payer: Self-pay | Admitting: Family Medicine

## 2013-10-31 ENCOUNTER — Telehealth: Payer: Self-pay

## 2013-10-31 NOTE — Telephone Encounter (Signed)
She needs results from last lab done/ she ask for a copy of her labs

## 2013-10-31 NOTE — Telephone Encounter (Signed)
Patient aware.

## 2013-10-31 NOTE — Telephone Encounter (Signed)
Message copied by Waverly Ferrari on Tue Oct 31, 2013 10:57 AM ------      Message from: Chevis Pretty      Created: Thu Oct 26, 2013  7:51 AM       CBNC normal      Thyroid normal- continue synthroid at 40mcg daily      Kidney and liver function stable- creatine unchanged- continue to avoid NSAIDS      Cholesterol looks good      Continue current meds- low fat diet and exercise and recheck in 3 months             ------

## 2013-11-01 ENCOUNTER — Encounter: Payer: Self-pay | Admitting: *Deleted

## 2013-11-16 ENCOUNTER — Other Ambulatory Visit: Payer: Self-pay | Admitting: Nurse Practitioner

## 2013-11-17 NOTE — Telephone Encounter (Signed)
According  To chart patient got printed rx to not fill until 11/21/13- did she lose it?

## 2013-11-17 NOTE — Telephone Encounter (Signed)
See last note

## 2013-12-19 ENCOUNTER — Other Ambulatory Visit: Payer: Self-pay | Admitting: Nurse Practitioner

## 2013-12-19 NOTE — Telephone Encounter (Signed)
Please call in xanax with 1 refills 

## 2013-12-19 NOTE — Telephone Encounter (Signed)
[facility]

## 2014-01-03 ENCOUNTER — Telehealth: Payer: Self-pay | Admitting: *Deleted

## 2014-01-03 NOTE — Telephone Encounter (Signed)
Feeling weaker with some dyspnea on exertion. Asking to have ferritin level checked on 01/11/14 when she is in town for dental appointment.  Brittany Hensley 6/11 at 1pm for labs. She agrees.

## 2014-01-04 ENCOUNTER — Telehealth: Payer: Self-pay | Admitting: Oncology

## 2014-01-04 NOTE — Telephone Encounter (Signed)
lmonvm advising the pt to pick up her sept appt calendar when she comes in for her lab appt in June.

## 2014-01-11 ENCOUNTER — Other Ambulatory Visit (HOSPITAL_BASED_OUTPATIENT_CLINIC_OR_DEPARTMENT_OTHER): Payer: Medicare Other

## 2014-01-11 DIAGNOSIS — K922 Gastrointestinal hemorrhage, unspecified: Secondary | ICD-10-CM

## 2014-01-11 DIAGNOSIS — D509 Iron deficiency anemia, unspecified: Secondary | ICD-10-CM

## 2014-01-11 DIAGNOSIS — D500 Iron deficiency anemia secondary to blood loss (chronic): Secondary | ICD-10-CM

## 2014-01-11 LAB — CBC WITH DIFFERENTIAL/PLATELET
BASO%: 0.7 % (ref 0.0–2.0)
Basophils Absolute: 0 10*3/uL (ref 0.0–0.1)
EOS%: 4.8 % (ref 0.0–7.0)
Eosinophils Absolute: 0.3 10*3/uL (ref 0.0–0.5)
HEMATOCRIT: 36.8 % (ref 34.8–46.6)
HGB: 11.9 g/dL (ref 11.6–15.9)
LYMPH%: 26.1 % (ref 14.0–49.7)
MCH: 32.2 pg (ref 25.1–34.0)
MCHC: 32.3 g/dL (ref 31.5–36.0)
MCV: 99.7 fL (ref 79.5–101.0)
MONO#: 0.5 10*3/uL (ref 0.1–0.9)
MONO%: 9.7 % (ref 0.0–14.0)
NEUT#: 3.2 10*3/uL (ref 1.5–6.5)
NEUT%: 58.7 % (ref 38.4–76.8)
Platelets: 250 10*3/uL (ref 145–400)
RBC: 3.69 10*6/uL — AB (ref 3.70–5.45)
RDW: 15 % — ABNORMAL HIGH (ref 11.2–14.5)
WBC: 5.5 10*3/uL (ref 3.9–10.3)
lymph#: 1.4 10*3/uL (ref 0.9–3.3)

## 2014-01-11 LAB — FERRITIN CHCC: Ferritin: 48 ng/ml (ref 9–269)

## 2014-01-12 ENCOUNTER — Telehealth: Payer: Self-pay | Admitting: *Deleted

## 2014-01-12 NOTE — Telephone Encounter (Signed)
Message copied by Brien Few on Fri Jan 12, 2014 12:57 PM ------      Message from: Betsy Coder B      Created: Thu Jan 11, 2014  9:42 PM       Please call patient, hb is normal, ferritin is ok, f/u as scheduled ------

## 2014-01-12 NOTE — Telephone Encounter (Signed)
Called pt with lab results. Per Dr. Benay Spice: HGB is normal, ferritin is OK.  Follow up as scheduled. Pt reports she and Dr. Benay Spice have an agreement that she'll receive iron when ferritin is less than 50. Pt asks to be scheduled for IV iron on a Thursday. Note to Dr. Benay Spice for review.

## 2014-01-14 ENCOUNTER — Other Ambulatory Visit: Payer: Self-pay | Admitting: Nurse Practitioner

## 2014-01-15 ENCOUNTER — Other Ambulatory Visit: Payer: Self-pay | Admitting: *Deleted

## 2014-01-15 NOTE — Progress Notes (Signed)
Per Dr. Benay Spice : OK to give iron infusion per patient request.

## 2014-01-16 ENCOUNTER — Telehealth: Payer: Self-pay | Admitting: *Deleted

## 2014-01-16 NOTE — Telephone Encounter (Signed)
Called in.

## 2014-01-16 NOTE — Telephone Encounter (Signed)
Please call in xaxax with 0 refills

## 2014-01-16 NOTE — Telephone Encounter (Signed)
Last filled 12/19/13, last seen 10/24/13, [facility]

## 2014-01-16 NOTE — Telephone Encounter (Signed)
Per staff message and POF I have scheduled appts. Advised patient of appt. JMW

## 2014-01-18 ENCOUNTER — Ambulatory Visit (HOSPITAL_BASED_OUTPATIENT_CLINIC_OR_DEPARTMENT_OTHER): Payer: Medicare Other

## 2014-01-18 ENCOUNTER — Other Ambulatory Visit: Payer: Self-pay | Admitting: Oncology

## 2014-01-18 VITALS — BP 120/77 | HR 77 | Temp 97.5°F | Resp 18

## 2014-01-18 DIAGNOSIS — D509 Iron deficiency anemia, unspecified: Secondary | ICD-10-CM

## 2014-01-18 MED ORDER — ACETAMINOPHEN 325 MG PO TABS
ORAL_TABLET | ORAL | Status: AC
  Filled 2014-01-18: qty 2

## 2014-01-18 MED ORDER — SODIUM CHLORIDE 0.9 % IV SOLN
1400.0000 mg | Freq: Once | INTRAVENOUS | Status: AC
  Administered 2014-01-18: 1400 mg via INTRAVENOUS
  Filled 2014-01-18: qty 28

## 2014-01-18 MED ORDER — HEPARIN SOD (PORK) LOCK FLUSH 100 UNIT/ML IV SOLN
500.0000 [IU] | Freq: Once | INTRAVENOUS | Status: DC | PRN
  Filled 2014-01-18: qty 5

## 2014-01-18 MED ORDER — SODIUM CHLORIDE 0.9 % IV SOLN
50.0000 mg | Freq: Once | INTRAVENOUS | Status: AC
  Administered 2014-01-18: 50 mg via INTRAVENOUS
  Filled 2014-01-18: qty 1

## 2014-01-18 MED ORDER — SODIUM CHLORIDE 0.9 % IJ SOLN
10.0000 mL | INTRAMUSCULAR | Status: DC | PRN
  Filled 2014-01-18: qty 10

## 2014-01-18 MED ORDER — DIPHENHYDRAMINE HCL 25 MG PO TABS
25.0000 mg | ORAL_TABLET | Freq: Once | ORAL | Status: AC
Start: 2014-01-18 — End: 2014-01-18
  Administered 2014-01-18: 25 mg via ORAL
  Filled 2014-01-18: qty 1

## 2014-01-18 MED ORDER — SODIUM CHLORIDE 0.9 % IV SOLN
INTRAVENOUS | Status: DC
  Administered 2014-01-18: 09:00:00 via INTRAVENOUS

## 2014-01-18 MED ORDER — ACETAMINOPHEN 325 MG PO TABS
650.0000 mg | ORAL_TABLET | Freq: Once | ORAL | Status: AC
  Administered 2014-01-18: 650 mg via ORAL

## 2014-01-18 MED ORDER — DIPHENHYDRAMINE HCL 25 MG PO CAPS
ORAL_CAPSULE | ORAL | Status: AC
  Filled 2014-01-18: qty 1

## 2014-01-18 NOTE — Patient Instructions (Signed)

## 2014-02-08 ENCOUNTER — Other Ambulatory Visit: Payer: Self-pay | Admitting: Nurse Practitioner

## 2014-02-10 NOTE — Telephone Encounter (Signed)
Last filled 01/16/2014 

## 2014-02-10 NOTE — Telephone Encounter (Signed)
rx called into pharmacy

## 2014-02-10 NOTE — Telephone Encounter (Signed)
Please call in xanax with 0 refills 

## 2014-03-13 ENCOUNTER — Other Ambulatory Visit: Payer: Self-pay | Admitting: Nurse Practitioner

## 2014-03-13 NOTE — Telephone Encounter (Signed)
Please call in xanax with 0 refills Patient NTBS for follow up and lab work  

## 2014-03-13 NOTE — Telephone Encounter (Signed)
Called in.

## 2014-03-13 NOTE — Telephone Encounter (Signed)
Last ov 3/15. Last refill 02/10/14. If approved cal to [facility].

## 2014-03-27 ENCOUNTER — Encounter: Payer: Self-pay | Admitting: Family Medicine

## 2014-03-27 ENCOUNTER — Ambulatory Visit (INDEPENDENT_AMBULATORY_CARE_PROVIDER_SITE_OTHER): Payer: Medicare Other | Admitting: Family Medicine

## 2014-03-27 ENCOUNTER — Telehealth: Payer: Self-pay | Admitting: Nurse Practitioner

## 2014-03-27 VITALS — BP 105/61 | HR 99 | Temp 98.1°F | Ht 65.0 in | Wt 98.0 lb

## 2014-03-27 DIAGNOSIS — H60399 Other infective otitis externa, unspecified ear: Secondary | ICD-10-CM

## 2014-03-27 DIAGNOSIS — H60392 Other infective otitis externa, left ear: Secondary | ICD-10-CM

## 2014-03-27 DIAGNOSIS — R259 Unspecified abnormal involuntary movements: Secondary | ICD-10-CM

## 2014-03-27 DIAGNOSIS — N3001 Acute cystitis with hematuria: Secondary | ICD-10-CM

## 2014-03-27 DIAGNOSIS — R350 Frequency of micturition: Secondary | ICD-10-CM

## 2014-03-27 DIAGNOSIS — N3000 Acute cystitis without hematuria: Secondary | ICD-10-CM

## 2014-03-27 DIAGNOSIS — R251 Tremor, unspecified: Secondary | ICD-10-CM

## 2014-03-27 LAB — POCT URINALYSIS DIPSTICK
Bilirubin, UA: NEGATIVE
Glucose, UA: NEGATIVE
Ketones, UA: NEGATIVE
Leukocytes, UA: NEGATIVE
Nitrite, UA: NEGATIVE
Spec Grav, UA: 1.01
Urobilinogen, UA: NEGATIVE
pH, UA: 6

## 2014-03-27 LAB — POCT UA - MICROSCOPIC ONLY
Bacteria, U Microscopic: NEGATIVE
Casts, Ur, LPF, POC: NEGATIVE
Crystals, Ur, HPF, POC: NEGATIVE
WBC, Ur, HPF, POC: NEGATIVE
Yeast, UA: NEGATIVE

## 2014-03-27 MED ORDER — CIPROFLOXACIN HCL 500 MG PO TABS: 500.0000 mg | ORAL_TABLET | Freq: Two times a day (BID) | ORAL | Status: DC

## 2014-03-27 MED ORDER — NEOMYCIN-POLYMYXIN-HC 3.5-10000-1 OT SOLN: 3.0000 [drp] | Freq: Four times a day (QID) | OTIC | Status: DC

## 2014-03-27 NOTE — Telephone Encounter (Signed)
appt scheduled

## 2014-03-27 NOTE — Progress Notes (Signed)
   Subjective:    Patient ID: Brittany Hensley, female    DOB: 1951-01-10, 63 y.o.   MRN: [id number]  HPI This 63 y.o. female presents for evaluation of ears feeling full and cerumen impactions.  She has been Having tremors and anxiety.  She takes xanax for anxiety and she feels like the xanax is not helping as good as it has in the past. She has urinary frequency.   Review of Systems C/o tremors, urinary freq, and cerumen impactions   No chest pain, SOB, HA, dizziness, vision change, N/V, diarrhea, constipation, myalgias, arthralgias or rash.  Objective:   Physical Exam  Anxious appearing cachetic female  HEENT - Head atraumatic Normocephalic                 Ears - Right EAC with cerumen impaction and Left EAC with cerumen impaction Respiratory - Lungs CTA bilateral Cardiac - RRR S1 and S2 without murmur GI - Abdomen soft Nontender and bowel sounds active x 4 Extremities - No edema. Neuro - hands with tremors       Assessment & Plan:  Urinary frequency - Plan: POCT urinalysis dipstick, POCT UA - Microscopic Only, Urine culture  Tremors of nervous system - Plan: Thyroid Panel With TSH, Urine culture  Acute cystitis with hematuria - Plan: ciprofloxacin (CIPRO) 500 MG tablet  Otitis, externa, infective, left - Plan: neomycin-polymyxin-hydrocortisone (CORTISPORIN) otic solution  Cerumen Impaction - Ears irrigated and left ear with red EAC   Anxiety - Follow up with [place] FNP

## 2014-03-28 ENCOUNTER — Telehealth: Payer: Self-pay | Admitting: Family Medicine

## 2014-03-28 LAB — THYROID PANEL WITH TSH
Free Thyroxine Index: 1.2 (ref 1.2–4.9)
T3 Uptake Ratio: 28 % (ref 24–39)
T4, Total: 4.2 ug/dL — ABNORMAL LOW (ref 4.5–12.0)
TSH: 1.81 u[IU]/mL (ref 0.450–4.500)

## 2014-03-29 ENCOUNTER — Encounter: Payer: Self-pay | Admitting: Nurse Practitioner

## 2014-03-29 ENCOUNTER — Ambulatory Visit (INDEPENDENT_AMBULATORY_CARE_PROVIDER_SITE_OTHER): Payer: Medicare Other | Admitting: Nurse Practitioner

## 2014-03-29 VITALS — BP 112/63 | HR 79 | Temp 97.6°F | Ht 65.0 in | Wt 97.0 lb

## 2014-03-29 DIAGNOSIS — F411 Generalized anxiety disorder: Secondary | ICD-10-CM

## 2014-03-29 MED ORDER — SERTRALINE HCL 50 MG PO TABS: 50.0000 mg | ORAL_TABLET | Freq: Every day | ORAL | Status: DC

## 2014-03-29 NOTE — Patient Instructions (Signed)
Stress and Stress Management Stress is a normal reaction to life events. It is what you feel when life demands more than you are used to or more than you can handle. Some stress can be useful. For example, the stress reaction can help you catch the last bus of the day, study for a test, or meet a deadline at work. But stress that occurs too often or for too long can cause problems. It can affect your emotional health and interfere with relationships and normal daily activities. Too much stress can weaken your immune system and increase your risk for physical illness. If you already have a medical problem, stress can make it worse. CAUSES  All sorts of life events may cause stress. An event that causes stress for one person may not be stressful for another person. Major life events commonly cause stress. These may be positive or negative. Examples include losing your job, moving into a new home, getting married, having a baby, or losing a loved one. Less obvious life events may also cause stress, especially if they occur day after day or in combination. Examples include working long hours, driving in traffic, caring for children, being in debt, or being in a difficult relationship. SIGNS AND SYMPTOMS Stress may cause emotional symptoms including, the following:  Anxiety. This is feeling worried, afraid, on edge, overwhelmed, or out of control.  Anger. This is feeling irritated or impatient.  Depression. This is feeling sad, down, helpless, or guilty.  Difficulty focusing, remembering, or making decisions. Stress may cause physical symptoms, including the following:   Aches and pains. These may affect your head, neck, back, stomach, or other areas of your body.  Tight muscles or clenched jaw.  Low energy or trouble sleeping. Stress may cause unhealthy behaviors, including the following:   Eating to feel better (overeating) or skipping meals.  Sleeping too little, too much, or both.  Working  too much or putting off tasks (procrastination).  Smoking, drinking alcohol, or using drugs to feel better. DIAGNOSIS  Stress is diagnosed through an assessment by your health care provider. Your health care provider will ask questions about your symptoms and any stressful life events.Your health care provider will also ask about your medical history and may order blood tests or other tests. Certain medical conditions and medicine can cause physical symptoms similar to stress. Mental illness can cause emotional symptoms and unhealthy behaviors similar to stress. Your health care provider may refer you to a mental health professional for further evaluation.  TREATMENT  Stress management is the recommended treatment for stress.The goals of stress management are reducing stressful life events and coping with stress in healthy ways.  Techniques for reducing stressful life events include the following:  Stress identification. Self-monitor for stress and identify what causes stress for you. These skills may help you to avoid some stressful events.  Time management. Set your priorities, keep a calendar of events, and learn to say "no." These tools can help you avoid making too many commitments. Techniques for coping with stress include the following:  Rethinking the problem. Try to think realistically about stressful events rather than ignoring them or overreacting. Try to find the positives in a stressful situation rather than focusing on the negatives.  Exercise. Physical exercise can release both physical and emotional tension. The key is to find a form of exercise you enjoy and do it regularly.  Relaxation techniques. These relax the body and mind. Examples include yoga, meditation, tai chi, biofeedback, deep  breathing, progressive muscle relaxation, listening to music, being out in nature, journaling, and other hobbies. Again, the key is to find one or more that you enjoy and can do  regularly.  Healthy lifestyle. Eat a balanced diet, get plenty of sleep, and do not smoke. Avoid using alcohol or drugs to relax.  Strong support network. Spend time with family, friends, or other people you enjoy being around.Express your feelings and talk things over with someone you trust. Counseling or talktherapy with a mental health professional may be helpful if you are having difficulty managing stress on your own. Medicine is typically not recommended for the treatment of stress.Talk to your health care provider if you think you need medicine for symptoms of stress. HOME CARE INSTRUCTIONS  Keep all follow-up visits as directed by your health care provider.  Take all medicines as directed by your health care provider. SEEK MEDICAL CARE IF:  Your symptoms get worse or you start having new symptoms.  You feel overwhelmed by your problems and can no longer manage them on your own. SEEK IMMEDIATE MEDICAL CARE IF:  You feel like hurting yourself or someone else. Document Released: 01/13/2001 Document Revised: 12/04/2013 Document Reviewed: 03/14/2013 Exit[employer] Patient Information 2015 [employer]. This information is not intended to replace advice given to you by your health care provider. Make sure you discuss any questions you have with your health care provider.  

## 2014-03-29 NOTE — Telephone Encounter (Signed)
Spoke with patient and she would like to see Shelah Lewandowsky today.  Appt scheduled. Patient aware.

## 2014-03-29 NOTE — Progress Notes (Signed)
   Subjective:    Patient ID: Brittany Hensley, female    DOB: Oct 11, 1950, 63 y.o.   MRN: [id number]  HPI Patient aw Brittany Hensley c/o shakiness and jitteriness over the weekend and ears feeled stopped up- They cleaned her ears out and said one looked a liitle red and they gave her an antibiotic. SHe is in today saying that she is still feeling shaky and jittry. A little dizziness and has been  Taking meclizine. She is on xanax and she says that that doesn't help. Use to be on zoloft in the past- [name] says that she feels ike she cannot relax. She is helping take care of others and stays busy.    Review of Systems  Constitutional: Negative.  Negative for appetite change.  Respiratory: Negative.   Cardiovascular: Negative.   Genitourinary: Negative.   Neurological: Positive for dizziness.  Psychiatric/Behavioral: Negative.   All other systems reviewed and are negative.      Objective:   Physical Exam  Constitutional: She is oriented to person, place, and time. She appears well-developed and well-nourished.  Cardiovascular: Normal rate, regular rhythm and normal heart sounds.   Pulmonary/Chest: Effort normal and breath sounds normal.  Neurological: She is alert and oriented to person, place, and time.  Skin: Skin is warm.  Psychiatric: She has a normal mood and affect. Her behavior is normal. Judgment and thought content normal.   BP 112/63  Pulse 79  Temp(Src) 97.6 F (36.4 C) (Oral)  Ht 5\' 5"  (1.651 m)  Wt 97 lb (43.999 kg)  BMI 16.14 kg/m2        Assessment & Plan:   1. GAD (generalized anxiety disorder)    Meds ordered this encounter  Medications  . sertraline (ZOLOFT) 50 MG tablet    Sig: Take 1 tablet (50 mg total) by mouth daily.    Dispense:  30 tablet    Refill:  3    Order Specific Question:  Supervising Provider    Answer:  Chipper Herb [[id number]]   Stress management Rest May continue meclizine as needed for dizziness RTO prn and in 1 month for follow  up.  Mary-Margaret Hassell Done, FNP

## 2014-03-30 ENCOUNTER — Telehealth: Payer: Self-pay | Admitting: Family Medicine

## 2014-03-30 ENCOUNTER — Telehealth: Payer: Self-pay | Admitting: Nurse Practitioner

## 2014-03-30 NOTE — Telephone Encounter (Signed)
Message copied by Cline Crock on Fri Mar 30, 2014 11:33 AM ------      Message from: Lysbeth Penner      Created: Fri Mar 30, 2014  8:09 AM       Thyroid panel is normal ------

## 2014-03-30 NOTE — Telephone Encounter (Signed)
Notified patient that results are not back yet and that this particular test takes several days to result.

## 2014-03-31 LAB — URINE CULTURE

## 2014-04-02 ENCOUNTER — Telehealth: Payer: Self-pay | Admitting: Family Medicine

## 2014-04-02 NOTE — Telephone Encounter (Signed)
Message copied by Waverly Ferrari on Mon Apr 02, 2014  8:54 AM ------      Message from: Lysbeth Penner      Created: Mon Apr 02, 2014  8:06 AM       UA cx positive for Ecoli which is sensitive to cipro and resistant to cipro to klebsiella pneumoniae however less colony forming units so I would recommend repeating UA one week after finishing abx's. ------

## 2014-04-02 NOTE — Telephone Encounter (Signed)
Patient aware.

## 2014-04-03 ENCOUNTER — Other Ambulatory Visit: Payer: Self-pay | Admitting: Family Medicine

## 2014-04-04 NOTE — Telephone Encounter (Signed)
Just refilled on 03/27/14.

## 2014-04-17 ENCOUNTER — Telehealth: Payer: Self-pay | Admitting: Oncology

## 2014-04-17 ENCOUNTER — Telehealth: Payer: Self-pay | Admitting: Nurse Practitioner

## 2014-04-17 ENCOUNTER — Other Ambulatory Visit: Payer: Medicare Other

## 2014-04-17 ENCOUNTER — Telehealth: Payer: Self-pay | Admitting: Family Medicine

## 2014-04-17 NOTE — Telephone Encounter (Signed)
Pt cld to r/s 82/80 conflict in sch r/s to 03/49 pt confirmed.Marland Kitchen..KJ

## 2014-04-17 NOTE — Telephone Encounter (Signed)
Pt wanted appt to discuss new med with MMM appt scheduled

## 2014-04-23 ENCOUNTER — Ambulatory Visit (INDEPENDENT_AMBULATORY_CARE_PROVIDER_SITE_OTHER): Payer: Medicare Other | Admitting: Nurse Practitioner

## 2014-04-23 ENCOUNTER — Encounter: Payer: Self-pay | Admitting: Nurse Practitioner

## 2014-04-23 VITALS — BP 108/63 | HR 83 | Temp 98.4°F | Ht 65.0 in | Wt 97.6 lb

## 2014-04-23 DIAGNOSIS — K219 Gastro-esophageal reflux disease without esophagitis: Secondary | ICD-10-CM

## 2014-04-23 DIAGNOSIS — E876 Hypokalemia: Secondary | ICD-10-CM

## 2014-04-23 DIAGNOSIS — H6120 Impacted cerumen, unspecified ear: Secondary | ICD-10-CM

## 2014-04-23 DIAGNOSIS — L719 Rosacea, unspecified: Secondary | ICD-10-CM

## 2014-04-23 DIAGNOSIS — R636 Underweight: Secondary | ICD-10-CM

## 2014-04-23 DIAGNOSIS — E034 Atrophy of thyroid (acquired): Secondary | ICD-10-CM

## 2014-04-23 DIAGNOSIS — H6122 Impacted cerumen, left ear: Secondary | ICD-10-CM

## 2014-04-23 DIAGNOSIS — D509 Iron deficiency anemia, unspecified: Secondary | ICD-10-CM

## 2014-04-23 DIAGNOSIS — E038 Other specified hypothyroidism: Secondary | ICD-10-CM

## 2014-04-23 DIAGNOSIS — E0789 Other specified disorders of thyroid: Secondary | ICD-10-CM

## 2014-04-23 NOTE — Progress Notes (Signed)
  Subjective:    Patient ID: Brittany Hensley, female    DOB: [birth year], 64 y.o.   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 Learta Codding GERD Sees Dr. Watt Climes- 3 x a year- recently had ballon dilitation of stomach- narrowing from gastrectomy Hypothyroidism Doing well on current meds- no complaints  Rosacea Metrogel- sees Dr. [place]- currently under control Kidney stones Patient hasn't been taking- Dr. Jeffie Pollock started her on this to prevent kidney stones but patient not taking GAD Patient currently taking xanax- takes care of her mother and takes 3 x a day- last rx not written correctly. Recently started on zoloft which is helping with her stress. Malabsorption problems due to gastrectomy Takes a lot of supplements but not sure she is absorbing everything she needs  Review of Systems  Constitutional: Negative.   HENT: Negative.   Eyes: Negative.   Respiratory: Negative.   Cardiovascular: Negative.   Gastrointestinal: Negative.   Endocrine: Negative.   Genitourinary: Negative.   Musculoskeletal: Negative.   Allergic/Immunologic: Negative.   Neurological: Negative.   Hematological: Negative.   Psychiatric/Behavioral: Positive for agitation. Negative for suicidal ideas and sleep disturbance. The patient is nervous/anxious.   All other systems reviewed and are negative.      Objective:   Physical Exam  Constitutional: She is oriented to person, place, and time.  Very thin  HENT:  Head: Normocephalic.  Right Ear: External ear normal.  Left Ear: External ear normal.  Nose: Nose normal.  Mouth/Throat: Oropharynx is clear and moist.  Soft cerumen impaction left ear  Eyes: EOM are normal. Pupils are equal, round, and reactive to light.  Neck: Normal range of motion. Neck  supple.  Cardiovascular: Normal rate, regular rhythm and normal heart sounds.   Pulmonary/Chest: Effort normal and breath sounds normal.  Abdominal: Soft. Bowel sounds are normal.  Musculoskeletal: Normal range of motion.  Neurological: She is alert and oriented to person, place, and time.  Skin: Skin is warm and dry.  Psychiatric: She has a normal mood and affect. Her behavior is normal. Judgment and thought content normal.   BP 108/63  Pulse 83  Temp(Src) 98.4 F (36.9 C) (Oral)  Ht $R'5\' 5"'IV$  (1.651 m)  Wt 97 lb 9.6 oz (44.271 kg)  BMI 16.24 kg/m2        Assessment & Plan:   1. Rosacea  2. Iron deficiency anemia, unspecified  3. Hypothyroidism due to acquired atrophy of thyroid  4. Hypokalemia - CMP14+EGFR  5. Gastroesophageal reflux disease without esophagitis  6. Cerumen impaction, left Debrox OTC 2-3 x a week  7. Underweight Eat 6 x a day and snack before bedtime    Labs pending Health maintenance reviewed Continue all meds Follow up  In 3 months   [place], FNP

## 2014-04-23 NOTE — Patient Instructions (Signed)

## 2014-04-26 ENCOUNTER — Ambulatory Visit: Payer: Medicare Other | Admitting: Oncology

## 2014-04-26 ENCOUNTER — Other Ambulatory Visit: Payer: Medicare Other

## 2014-05-03 ENCOUNTER — Other Ambulatory Visit: Payer: Self-pay | Admitting: Nurse Practitioner

## 2014-05-03 ENCOUNTER — Telehealth: Payer: Self-pay | Admitting: Oncology

## 2014-05-03 ENCOUNTER — Other Ambulatory Visit (HOSPITAL_BASED_OUTPATIENT_CLINIC_OR_DEPARTMENT_OTHER): Payer: Medicare Other

## 2014-05-03 ENCOUNTER — Ambulatory Visit (HOSPITAL_BASED_OUTPATIENT_CLINIC_OR_DEPARTMENT_OTHER): Payer: Medicare Other | Admitting: Nurse Practitioner

## 2014-05-03 ENCOUNTER — Other Ambulatory Visit: Payer: Medicare Other

## 2014-05-03 ENCOUNTER — Ambulatory Visit: Payer: Medicare Other | Admitting: Oncology

## 2014-05-03 VITALS — BP 114/67 | HR 72 | Temp 98.3°F | Resp 18 | Ht 65.0 in | Wt 97.6 lb

## 2014-05-03 DIAGNOSIS — D509 Iron deficiency anemia, unspecified: Secondary | ICD-10-CM

## 2014-05-03 DIAGNOSIS — D500 Iron deficiency anemia secondary to blood loss (chronic): Secondary | ICD-10-CM

## 2014-05-03 DIAGNOSIS — K922 Gastrointestinal hemorrhage, unspecified: Secondary | ICD-10-CM

## 2014-05-03 LAB — CBC WITH DIFFERENTIAL/PLATELET
BASO%: 1.3 % (ref 0.0–2.0)
Basophils Absolute: 0.1 10*3/uL (ref 0.0–0.1)
EOS%: 3.4 % (ref 0.0–7.0)
Eosinophils Absolute: 0.2 10*3/uL (ref 0.0–0.5)
HCT: 37.7 % (ref 34.8–46.6)
HGB: 12.1 g/dL (ref 11.6–15.9)
LYMPH%: 24.8 % (ref 14.0–49.7)
MCH: 32.2 pg (ref 25.1–34.0)
MCHC: 32 g/dL (ref 31.5–36.0)
MCV: 100.6 fL (ref 79.5–101.0)
MONO#: 0.6 10*3/uL (ref 0.1–0.9)
MONO%: 10.1 % (ref 0.0–14.0)
NEUT#: 3.3 10*3/uL (ref 1.5–6.5)
NEUT%: 60.4 % (ref 38.4–76.8)
PLATELETS: 324 10*3/uL (ref 145–400)
RBC: 3.75 10*6/uL (ref 3.70–5.45)
RDW: 14.6 % — ABNORMAL HIGH (ref 11.2–14.5)
WBC: 5.5 10*3/uL (ref 3.9–10.3)
lymph#: 1.4 10*3/uL (ref 0.9–3.3)

## 2014-05-03 LAB — FERRITIN CHCC: Ferritin: 150 ng/ml (ref 9–269)

## 2014-05-03 NOTE — Progress Notes (Signed)
  [place] OFFICE PROGRESS NOTE   Diagnosis:  Iron deficiency anemia  INTERVAL HISTORY:   Brittany Hensley returns as scheduled. She last received IV iron 01/18/2014. She notes that when her iron level is low she has a dull headache, decreased energy and hair loss. About 3 weeks after each iron infusion the symptoms resolve. She feels well. She has a good energy level.  Objective:  Vital signs in last 24 hours:  Blood pressure 114/67, pulse 72, temperature 98.3 F (36.8 C), temperature source Oral, resp. rate 18, height 5\' 5"  (1.651 m), weight 97 lb 9.6 oz (44.271 kg).    HEENT: Left angular cheilitis. Resp: Lungs clear bilaterally. Cardio: Regular rate and rhythm. GI: No organomegaly. Vascular: No leg edema.    Lab Results:  Lab Results  Component Value Date   WBC 5.5 05/03/2014   HGB 12.1 05/03/2014   HCT 37.7 05/03/2014   MCV 100.6 05/03/2014   PLT 324 05/03/2014   NEUTROABS 3.3 05/03/2014   Ferritin 150 Imaging:  No results found.  Medications: I have reviewed the patient's current medications.  Assessment/Plan: 1. Chronic anemia secondary to gastrointestinal blood loss and iron deficiency, she last received IV iron 01/18/2014. 2. History of kidney stones, followed by Dr. Roni Bread. 3. Gram-negative sepsis and septic shock syndrome April 2008. 4. History of acute renal failure secondary to obstruction and septic shock. 5. History of adrenal insufficiency, now maintained off of steroid hormone replacement. She is followed by Dr. Loanne Drilling. 6. Questionable allergic to Venofer in June 2010. She reported an elevated blood pressure and "dizziness" on the day following the Venofer therapy. She has tolerated iron dextran without an apparent reaction. 7. History of a left forearm fracture.  8. Intermittent low-serum bicarbonate level, ? related to diarrhea or renal insufficiency 9. Dysphagia, improved with erythromycin and esophageal dilatation procedures , status post  dilatation of the gastric jejunal anastomosis 10/03/2013    Disposition: [name] appears stable. She is not iron deficient at present. She will return for a CBC and ferritin level in 3 months. She will return for a followup visit in 6 months.    Ned Card ANP/GNP-BC   05/03/2014  3:49 PM

## 2014-05-03 NOTE — Telephone Encounter (Signed)
gv adn printed appt sched and avs fo rpt for Dec and March

## 2014-05-04 NOTE — Telephone Encounter (Signed)
Last filled 04/07/14, last seen 04/23/14. [facility]

## 2014-05-06 NOTE — Telephone Encounter (Signed)
Please call in xanax with 1 refills 

## 2014-05-07 ENCOUNTER — Other Ambulatory Visit: Payer: Self-pay | Admitting: Nurse Practitioner

## 2014-05-07 NOTE — Telephone Encounter (Signed)
Called into pharmacy

## 2014-05-08 ENCOUNTER — Ambulatory Visit: Payer: Medicare Other | Admitting: Nurse Practitioner

## 2014-05-08 NOTE — Telephone Encounter (Signed)
Last filled 04/07/14, last seen 04/23/14. Call into [facility]

## 2014-05-08 NOTE — Telephone Encounter (Signed)
Called in.

## 2014-05-08 NOTE — Telephone Encounter (Signed)
Please call in xanax with 1 refills 

## 2014-06-02 ENCOUNTER — Other Ambulatory Visit: Payer: Self-pay | Admitting: Nurse Practitioner

## 2014-06-14 ENCOUNTER — Ambulatory Visit (INDEPENDENT_AMBULATORY_CARE_PROVIDER_SITE_OTHER): Payer: Medicare Other | Admitting: Nurse Practitioner

## 2014-06-14 ENCOUNTER — Encounter: Payer: Self-pay | Admitting: Nurse Practitioner

## 2014-06-14 VITALS — BP 104/63 | HR 71 | Temp 97.1°F | Ht 65.0 in | Wt 95.2 lb

## 2014-06-14 DIAGNOSIS — F411 Generalized anxiety disorder: Secondary | ICD-10-CM

## 2014-06-14 DIAGNOSIS — D509 Iron deficiency anemia, unspecified: Secondary | ICD-10-CM

## 2014-06-14 DIAGNOSIS — E039 Hypothyroidism, unspecified: Secondary | ICD-10-CM

## 2014-06-14 MED ORDER — ALPRAZOLAM 0.5 MG PO TABS: ORAL_TABLET | ORAL | Status: DC

## 2014-06-14 MED ORDER — LEVOTHYROXINE SODIUM 88 MCG PO TABS
88.0000 ug | ORAL_TABLET | Freq: Every day | ORAL | Status: DC
Start: 2014-06-14 — End: 2014-12-14

## 2014-06-14 NOTE — Patient Instructions (Signed)
Iron Deficiency Anemia Anemia is a condition in which there are less red blood cells or hemoglobin in the blood than normal. Hemoglobin is the part of red blood cells that carries oxygen. Iron deficiency anemia is anemia caused by too little iron. It is the most common type of anemia. It may leave you tired and short of breath. CAUSES   Lack of iron in the diet.  Poor absorption of iron, as seen with intestinal disorders.  Intestinal bleeding.  Heavy periods. SIGNS AND SYMPTOMS  Mild anemia may not be noticeable. Symptoms may include:  Fatigue.  Headache.  Pale skin.  Weakness.  Tiredness.  Shortness of breath.  Dizziness.  Cold hands and feet.  Fast or irregular heartbeat. DIAGNOSIS  Diagnosis requires a thorough evaluation and physical exam by your health care provider. Blood tests are generally used to confirm iron deficiency anemia. Additional tests may be done to find the underlying cause of your anemia. These may include:  Testing for blood in the stool (fecal occult blood test).  A procedure to see inside the colon and rectum (colonoscopy).  A procedure to see inside the esophagus and stomach (endoscopy). TREATMENT  Iron deficiency anemia is treated by correcting the cause of the deficiency. Treatment may involve:  Adding iron-rich foods to your diet.  Taking iron supplements. Pregnant or breastfeeding women need to take extra iron because their normal diet usually does not provide the required amount.  Taking vitamins. Vitamin C improves the absorption of iron. Your health care provider may recommend that you take your iron tablets with a glass of orange juice or vitamin C supplement.  Medicines to make heavy menstrual flow lighter.  Surgery. HOME CARE INSTRUCTIONS   Take iron as directed by your health care provider.  If you cannot tolerate taking iron supplements by mouth, talk to your health care provider about taking them through a vein  (intravenously) or an injection into a muscle.  For the best iron absorption, iron supplements should be taken on an empty stomach. If you cannot tolerate them on an empty stomach, you may need to take them with food.  Do not drink milk or take antacids at the same time as your iron supplements. Milk and antacids may interfere with the absorption of iron.  Iron supplements can cause constipation. Make sure to include fiber in your diet to prevent constipation. A stool softener may also be recommended.  Take vitamins as directed by your health care provider.  Eat a diet rich in iron. Foods high in iron include liver, lean beef, whole-grain bread, eggs, dried fruit, and dark green leafy vegetables. SEEK IMMEDIATE MEDICAL CARE IF:   You faint. If this happens, do not drive. Call your local emergency services (911 in [place] if no other help is available.  You have chest pain.  You feel nauseous or vomit.  You have severe or increased shortness of breath with activity.  You feel weak.  You have a rapid heartbeat.  You have unexplained sweating.  You become light-headed when getting up from a chair or bed. MAKE SURE YOU:   Understand these instructions.  Will watch your condition.  Will get help right away if you are not doing well or get worse. Document Released: 07/17/2000 Document Revised: 07/25/2013 Document Reviewed: 03/27/2013 Exit[employer] Patient Information 2015 [employer]. This information is not intended to replace advice given to you by your health care provider. Make sure you discuss any questions you have with your health care provider.  

## 2014-06-14 NOTE — Progress Notes (Signed)
Subjective:    Patient ID: Brittany Hensley, female    DOB: 30-Aug-1950, 63 y.o.   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 Learta Codding- Just had Hgb checked Oct ober first and it was good. GERD Sees Dr. Watt Climes- 3 x a year- recently had ballon dilitation of stomach- narrowing from gastrectomy Hypothyroidism Doing well on current meds- no complaints  Rosacea Metrogel- sees Dr. Syble Creek- currently under control Kidney stones Patient hasn't been taking- Dr. Jeffie Pollock started her on this to prevent kidney stones but patient not taking GAD Patient currently taking xanax- takes care of her mother and takes 3 x a day- last rx not written correctly. Recently started on zoloft which is helping with her stress. Would like to increase zoloft. Malabsorption problems due to gastrectomy Takes a lot of supplements but not sure she is absorbing everything she needs  Review of Systems  Constitutional: Negative.   HENT: Negative.   Eyes: Negative.   Respiratory: Negative.   Cardiovascular: Negative.   Gastrointestinal: Negative.   Endocrine: Negative.   Genitourinary: Negative.   Musculoskeletal: Negative.   Allergic/Immunologic: Negative.   Neurological: Negative.   Hematological: Negative.   Psychiatric/Behavioral: Positive for agitation. Negative for suicidal ideas and sleep disturbance. The patient is nervous/anxious.   All other systems reviewed and are negative.      Objective:   Physical Exam  Constitutional: She is oriented to person, place, and time. She appears well-developed and well-nourished.  HENT:  Nose: Nose normal.  Mouth/Throat: Oropharynx is clear and moist.  Eyes: EOM are normal.  Neck: Trachea normal, normal range of motion and full passive range of motion without pain.  Neck supple. No JVD present. Carotid bruit is not present. No thyromegaly present.  Cardiovascular: Normal rate, regular rhythm, normal heart sounds and intact distal pulses.  Exam reveals no gallop and no friction rub.   No murmur heard. Pulmonary/Chest: Effort normal and breath sounds normal.  Abdominal: Soft. Bowel sounds are normal. She exhibits no distension and no mass. There is no tenderness.  Musculoskeletal: Normal range of motion.  Lymphadenopathy:    She has no cervical adenopathy.  Neurological: She is alert and oriented to person, place, and time. She has normal reflexes.  Skin: Skin is warm and dry. There is pallor.  Psychiatric: She has a normal mood and affect. Her behavior is normal. Judgment and thought content normal.   BP 104/63 mmHg  Pulse 71  Temp(Src) 97.1 F (36.2 C) (Oral)  Ht 5' 5"  (1.651 m)  Wt 95 lb 3.2 oz (43.182 kg)  BMI 15.84 kg/m2        Assessment & Plan:   1. Hypothyroidism, unspecified hypothyroidism type   2. GAD (generalized anxiety disorder)   3. Iron deficiency anemia    Orders Placed This Encounter  Procedures  . CMP14+EGFR  . NMR, lipoprofile  . Anemia Profile B  . Thyroid Panel With TSH   Meds ordered this encounter  Medications  . levothyroxine (SYNTHROID, LEVOTHROID) 88 MCG tablet    Sig: Take 1 tablet (88 mcg total) by mouth daily before breakfast.    Dispense:  30 tablet    Refill:  5    Order Specific Question:  Supervising Provider    Answer:  Chipper Herb [  [id number]]  . ALPRAZolam (XANAX) 0.5 MG tablet    Sig: TAKE 1/2 TO 1 TABLET EVERY 8 TO 12 HOURSAS NEEDED    Dispense:  90 tablet    Refill:  1    Order Specific Question:  Supervising Provider    Answer:  Chipper Herb Timblin maintenance reviewed Labs pending Follow up in 6 months and prn  Mary-Margaret Hassell Done, FNP

## 2014-06-15 LAB — ANEMIA PROFILE B
Basophils Absolute: 0 10*3/uL (ref 0.0–0.2)
Basos: 1 %
EOS ABS: 0.2 10*3/uL (ref 0.0–0.4)
Eos: 4 %
FERRITIN: 211 ng/mL — AB (ref 15–150)
Folate: >19.9 ng/mL (ref >3.0)
HCT: 37.9 % (ref 34.0–46.6)
Hemoglobin: 12.5 g/dL (ref 11.1–15.9)
IMMATURE GRANS (ABS): 0 10*3/uL (ref 0.0–0.1)
IRON: 61 ug/dL (ref 35–155)
Immature Granulocytes: 0 %
Iron Saturation: 26 % (ref 15–55)
Lymphocytes Absolute: 1.4 10*3/uL (ref 0.7–3.1)
Lymphs: 27 %
MCH: 32.4 pg (ref 26.6–33.0)
MCHC: 33 g/dL (ref 31.5–35.7)
MCV: 98 fL — AB (ref 79–97)
MONOS ABS: 0.4 10*3/uL (ref 0.1–0.9)
Monocytes: 8 %
NEUTROS ABS: 3.2 10*3/uL (ref 1.4–7.0)
NEUTROS PCT: 60 %
Platelets: 282 10*3/uL (ref 150–379)
RBC: 3.86 x10E6/uL (ref 3.77–5.28)
RDW: 14.9 % (ref 12.3–15.4)
Retic Ct Pct: 1.6 % (ref 0.6–2.6)
TIBC: 232 ug/dL — AB (ref 250–450)
UIBC: 171 ug/dL (ref 150–375)
WBC: 5.3 10*3/uL (ref 3.4–10.8)

## 2014-06-15 LAB — CMP14+EGFR
ALT: 8 IU/L (ref 0–32)
AST: 18 IU/L (ref 0–40)
Albumin/Globulin Ratio: 2.1 (ref 1.1–2.5)
Albumin: 3.8 g/dL (ref 3.6–4.8)
Alkaline Phosphatase: 42 IU/L (ref 39–117)
BUN/Creatinine Ratio: 13 (ref 11–26)
BUN: 17 mg/dL (ref 8–27)
CALCIUM: 9.1 mg/dL (ref 8.7–10.3)
CHLORIDE: 113 mmol/L — AB (ref 97–108)
CO2: 17 mmol/L — ABNORMAL LOW (ref 18–29)
Creatinine, Ser: 1.33 mg/dL — ABNORMAL HIGH (ref 0.57–1.00)
GFR calc Af Amer: 49 mL/min/{1.73_m2} — ABNORMAL LOW (ref >59)
GFR calc non Af Amer: 42 mL/min/{1.73_m2} — ABNORMAL LOW (ref >59)
Globulin, Total: 1.8 g/dL (ref 1.5–4.5)
Glucose: 119 mg/dL — ABNORMAL HIGH (ref 65–99)
POTASSIUM: 4.6 mmol/L (ref 3.5–5.2)
SODIUM: 145 mmol/L — AB (ref 134–144)
TOTAL PROTEIN: 5.6 g/dL — AB (ref 6.0–8.5)
Total Bilirubin: <0.2 mg/dL (ref 0.0–1.2)

## 2014-06-15 LAB — NMR, LIPOPROFILE
Cholesterol: 190 mg/dL (ref 100–199)
HDL CHOLESTEROL BY NMR: 58 mg/dL (ref >39)
HDL Particle Number: 36.7 umol/L (ref >=30.5)
LDL PARTICLE NUMBER: 929 nmol/L (ref <1000)
LDL Size: 21.7 nm (ref >20.5)
LDL-C: 108 mg/dL — ABNORMAL HIGH (ref 0–99)
LP-IR Score: 30 (ref <=45)
Small LDL Particle Number: 239 nmol/L (ref <=527)
TRIGLYCERIDES BY NMR: 122 mg/dL (ref 0–149)

## 2014-06-15 LAB — THYROID PANEL WITH TSH
FREE THYROXINE INDEX: 1 — AB (ref 1.2–4.9)
T3 UPTAKE RATIO: 28 % (ref 24–39)
T4 TOTAL: 3.7 ug/dL — AB (ref 4.5–12.0)
TSH: 2.06 u[IU]/mL (ref 0.450–4.500)

## 2014-06-27 ENCOUNTER — Telehealth: Payer: Self-pay | Admitting: Nurse Practitioner

## 2014-06-27 NOTE — Telephone Encounter (Signed)
FYI

## 2014-06-29 MED ORDER — SERTRALINE HCL 50 MG PO TABS: ORAL_TABLET | ORAL | Status: DC

## 2014-07-19 ENCOUNTER — Other Ambulatory Visit (HOSPITAL_BASED_OUTPATIENT_CLINIC_OR_DEPARTMENT_OTHER): Payer: Medicare Other

## 2014-07-19 DIAGNOSIS — D509 Iron deficiency anemia, unspecified: Secondary | ICD-10-CM

## 2014-07-19 DIAGNOSIS — D500 Iron deficiency anemia secondary to blood loss (chronic): Secondary | ICD-10-CM

## 2014-07-19 LAB — CBC WITH DIFFERENTIAL/PLATELET
BASO%: 0.9 % (ref 0.0–2.0)
BASOS ABS: 0 10*3/uL (ref 0.0–0.1)
EOS%: 2.4 % (ref 0.0–7.0)
Eosinophils Absolute: 0.1 10*3/uL (ref 0.0–0.5)
HCT: 36.7 % (ref 34.8–46.6)
HEMOGLOBIN: 11.3 g/dL — AB (ref 11.6–15.9)
LYMPH%: 28.9 % (ref 14.0–49.7)
MCH: 31.4 pg (ref 25.1–34.0)
MCHC: 30.8 g/dL — ABNORMAL LOW (ref 31.5–36.0)
MCV: 101.9 fL — ABNORMAL HIGH (ref 79.5–101.0)
MONO#: 0.4 10*3/uL (ref 0.1–0.9)
MONO%: 8.8 % (ref 0.0–14.0)
NEUT%: 59 % (ref 38.4–76.8)
NEUTROS ABS: 2.7 10*3/uL (ref 1.5–6.5)
Platelets: 270 10*3/uL (ref 145–400)
RBC: 3.6 10*6/uL — AB (ref 3.70–5.45)
RDW: 14 % (ref 11.2–14.5)
WBC: 4.5 10*3/uL (ref 3.9–10.3)
lymph#: 1.3 10*3/uL (ref 0.9–3.3)

## 2014-07-19 LAB — FERRITIN CHCC: FERRITIN: 107 ng/mL (ref 9–269)

## 2014-07-22 ENCOUNTER — Other Ambulatory Visit: Payer: Self-pay | Admitting: Nurse Practitioner

## 2014-07-23 ENCOUNTER — Telehealth: Payer: Self-pay | Admitting: *Deleted

## 2014-07-23 NOTE — Telephone Encounter (Signed)
-----   Message from Ladell Pier, MD sent at 07/19/2014  9:40 PM EST ----- Please call patient, hb and ferritin ok, f/u as scheduled

## 2014-07-23 NOTE — Telephone Encounter (Signed)
Left VM to call office for lab results/nonurgent.

## 2014-07-24 ENCOUNTER — Telehealth: Payer: Self-pay | Admitting: *Deleted

## 2014-07-24 NOTE — Telephone Encounter (Signed)
Per Dr. Benay Spice; notified pt that hb and ferritin ok; ferritin level is 107.  Pt verbalized understanding and confirmed appt for 10/18/14 lab/MD.

## 2014-07-24 NOTE — Telephone Encounter (Signed)
-----   Message from Ladell Pier, MD sent at 07/19/2014  9:40 PM EST ----- Please call patient, hb and ferritin ok, f/u as scheduled

## 2014-08-08 ENCOUNTER — Other Ambulatory Visit: Payer: Self-pay | Admitting: Gastroenterology

## 2014-08-09 NOTE — Addendum Note (Signed)
Addended byClarene Essex on: 08/09/2014 10:19 AM   Modules accepted: Orders

## 2014-08-10 ENCOUNTER — Encounter (HOSPITAL_COMMUNITY): Payer: Self-pay

## 2014-08-10 ENCOUNTER — Ambulatory Visit (HOSPITAL_COMMUNITY)
Admission: RE | Admit: 2014-08-10 | Discharge: 2014-08-10 | Disposition: A | Discharge status: Home / Self Care | Payer: PPO | Source: Ambulatory Visit | Attending: Gastroenterology | Admitting: Gastroenterology

## 2014-08-10 ENCOUNTER — Encounter (HOSPITAL_COMMUNITY)
Admission: RE | Disposition: A | Discharge status: Home / Self Care | Payer: Self-pay | Source: Ambulatory Visit | Attending: Gastroenterology

## 2014-08-10 DIAGNOSIS — Z8042 Family history of malignant neoplasm of prostate: Secondary | ICD-10-CM | POA: Insufficient documentation

## 2014-08-10 DIAGNOSIS — F419 Anxiety disorder, unspecified: Secondary | ICD-10-CM | POA: Insufficient documentation

## 2014-08-10 DIAGNOSIS — R010 Benign and innocent cardiac murmurs: Secondary | ICD-10-CM | POA: Insufficient documentation

## 2014-08-10 DIAGNOSIS — Z882 Allergy status to sulfonamides status: Secondary | ICD-10-CM | POA: Diagnosis not present

## 2014-08-10 DIAGNOSIS — Z833 Family history of diabetes mellitus: Secondary | ICD-10-CM | POA: Diagnosis not present

## 2014-08-10 DIAGNOSIS — Z87442 Personal history of urinary calculi: Secondary | ICD-10-CM | POA: Insufficient documentation

## 2014-08-10 DIAGNOSIS — E039 Hypothyroidism, unspecified: Secondary | ICD-10-CM | POA: Insufficient documentation

## 2014-08-10 DIAGNOSIS — K311 Adult hypertrophic pyloric stenosis: Secondary | ICD-10-CM | POA: Insufficient documentation

## 2014-08-10 DIAGNOSIS — Z888 Allergy status to other drugs, medicaments and biological substances status: Secondary | ICD-10-CM | POA: Diagnosis not present

## 2014-08-10 DIAGNOSIS — K219 Gastro-esophageal reflux disease without esophagitis: Secondary | ICD-10-CM | POA: Diagnosis present

## 2014-08-10 DIAGNOSIS — K449 Diaphragmatic hernia without obstruction or gangrene: Secondary | ICD-10-CM | POA: Diagnosis not present

## 2014-08-10 HISTORY — PX: ESOPHAGOGASTRODUODENOSCOPY: SHX5428

## 2014-08-10 HISTORY — PX: SAVORY DILATION: SHX5439

## 2014-08-10 HISTORY — PX: BALLOON DILATION: SHX5330

## 2014-08-10 SURGERY — EGD (ESOPHAGOGASTRODUODENOSCOPY)
Anesthesia: Moderate Sedation

## 2014-08-10 MED ORDER — MIDAZOLAM HCL 10 MG/2ML IJ SOLN
INTRAMUSCULAR | Status: DC | PRN
Start: 2014-08-10 — End: 2014-08-10
  Administered 2014-08-10 (×2): 2 mg via INTRAVENOUS
  Administered 2014-08-10 (×2): 1 mg via INTRAVENOUS

## 2014-08-10 MED ORDER — BUTAMBEN-TETRACAINE-BENZOCAINE 2-2-14 % EX AERO
INHALATION_SPRAY | CUTANEOUS | Status: DC | PRN
  Administered 2014-08-10: 2 via TOPICAL

## 2014-08-10 MED ORDER — FENTANYL CITRATE 0.05 MG/ML IJ SOLN
INTRAMUSCULAR | Status: AC
  Filled 2014-08-10: qty 2

## 2014-08-10 MED ORDER — FENTANYL CITRATE 0.05 MG/ML IJ SOLN
INTRAMUSCULAR | Status: DC | PRN
  Administered 2014-08-10 (×3): 25 ug via INTRAVENOUS

## 2014-08-10 MED ORDER — SODIUM CHLORIDE 0.9 % IV SOLN
INTRAVENOUS | Status: DC
  Administered 2014-08-10: 500 mL via INTRAVENOUS

## 2014-08-10 MED ORDER — MIDAZOLAM HCL 10 MG/2ML IJ SOLN
INTRAMUSCULAR | Status: AC
  Filled 2014-08-10: qty 2

## 2014-08-10 NOTE — Discharge Instructions (Addendum)
Call if question or problem otherwise liquids only until 3:30 PM and then may have soft solids today and may advance diet tomorrow and continue to avoid all aspirin and nonsteroidals and arthritis pills and  follow-up in 6 months but call sooner if dilation not helpful or if question or problem  Esophagogastroduodenoscopy Care After Refer to this sheet in the next few weeks. These instructions provide you with information on caring for yourself after your procedure. Your caregiver may also give you more specific instructions. Your treatment has been planned according to current medical practices, but problems sometimes occur. Call your caregiver if you have any problems or questions after your procedure.  HOME CARE INSTRUCTIONS  Do not eat or drink anything until the numbing medicine (local anesthetic) has worn off and your gag reflex has returned. You will know that the local anesthetic has worn off when you can swallow comfortably.  Do not drive for 12 hours after the procedure or as directed by your caregiver.  Only take medicines as directed by your caregiver. SEEK MEDICAL CARE IF:   You cannot stop coughing.  You are not urinating at all or less than usual. SEEK IMMEDIATE MEDICAL CARE IF:  You have difficulty swallowing.  You cannot eat or drink.  You have worsening throat or chest pain.  You have dizziness, lightheadedness, or you faint.  You have nausea or vomiting.  You have chills.  You have a fever.  You have severe abdominal pain.  You have black, tarry, or bloody stools. Document Released: 07/06/2012 Document Reviewed: 07/06/2012 [facility] Patient Information 2015 [place]. This information is not intended to replace advice given to you by your health care provider. Make sure you discuss any questions you have with your health care provider.   Conscious Sedation, Adult, Care After Refer to this sheet in the next few weeks. These instructions provide you with  information on caring for yourself after your procedure. Your health care provider may also give you more specific instructions. Your treatment has been planned according to current medical practices, but problems sometimes occur. Call your health care provider if you have any problems or questions after your procedure. WHAT TO EXPECT AFTER THE PROCEDURE  After your procedure:  You may feel sleepy, clumsy, and have poor balance for several hours.  Vomiting may occur if you eat too soon after the procedure. HOME CARE INSTRUCTIONS  Do not participate in any activities where you could become injured for at least 24 hours. Do not:  Drive.  Swim.  Ride a bicycle.  Operate heavy machinery.  Cook.  Use power tools.  Climb ladders.  Work from a high place.  Do not make important decisions or sign legal documents until you are improved.  If you vomit, drink water, juice, or soup when you can drink without vomiting. Make sure you have little or no nausea before eating solid foods.  Only take over-the-counter or prescription medicines for pain, discomfort, or fever as directed by your health care provider.  Make sure you and your family fully understand everything about the medicines given to you, including what side effects may occur.  You should not drink alcohol, take sleeping pills, or take medicines that cause drowsiness for at least 24 hours.  If you smoke, do not smoke without supervision.  If you are feeling better, you may resume normal activities 24 hours after you were sedated.  Keep all appointments with your health care provider. SEEK MEDICAL CARE IF:  Your  skin is pale or bluish in color.  You continue to feel nauseous or vomit.  Your pain is getting worse and is not helped by medicine.  You have bleeding or swelling.  You are still sleepy or feeling clumsy after 24 hours. SEEK IMMEDIATE MEDICAL CARE IF:  You develop a rash.  You have difficulty  breathing.  You develop any type of allergic problem.  You have a fever. MAKE SURE YOU:  Understand these instructions.  Will watch your condition.  Will get help right away if you are not doing well or get worse. Document Released: 05/10/2013 Document Reviewed: 05/10/2013 [facility] Patient Information 2015 [place], [place]. This information is not intended to replace advice given to you by your health care provider. Make sure you discuss any questions you have with your health care provider.

## 2014-08-10 NOTE — Progress Notes (Signed)
Brittany Hensley 8:28 AM  Subjective: Patient without any new complaints since we saw her recently in the office  Objective: Vital signs stable afebrile no acute distress exam please see preassessment evaluation  Assessment: Recurrent anastomotic stricture and gastric outlet obstruction  Plan: Okay to proceed with endoscopy and probably balloon dilatation today  Michaeljames Milnes E

## 2014-08-10 NOTE — Op Note (Signed)
[facility] [place], [address]   ENDOSCOPY PROCEDURE REPORT  PATIENT: Bretta, Fees  MR#: [id number] BIRTHDATE: 1950-09-01 , 63  yrs. old GENDER: female ENDOSCOPIST: Clarene Essex, MD REFERRED BY:  Arturo Morton, M.D. PROCEDURE DATE:  08/10/2014 PROCEDURE:  EGD w/ balloon dilation ASA CLASS:     Class III INDICATIONS:  history of esophageal reflux and nausea. MEDICATIONS: Fentanyl 75 mcg IV and Versed 6 mg IV TOPICAL ANESTHETIC: Cetacaine Spray  DESCRIPTION OF PROCEDURE: After the risks benefits and alternatives of the procedure were thoroughly explained, informed consent was obtained.  The [place] [id number] endoscope was introduced through the mouth and advanced to the proximal jejunum , Without limitations. after initial balloon dilation from 12 mm to 15 mm using the wire-guided balloon  The instrument was slowly withdrawn as the mucosa was fully examined as much as possible due to retained food and at the end of the procedure we dilated the anastomosis to 16.5 using the 8 cm  adjustable balloon.    the findings are recorded below       Retroflexed views revealed a hiatal hernia.     The scope was then withdrawn from the patient and the procedure completed.  COMPLICATIONS: There were no immediate complications.  ENDOSCOPIC IMPRESSION: 1. small hiatal hernia with minimal esophagitis 2. lots of old food in the stomach clogged scope frequently with suctioning 3. deformed anastomosis with scarring and diverticuli status post balloon dilation as above of efferent limb and able to pass scope    after initial balloon dilation as above with normal jejunum 4. afferent limb seen but unable to enter with larger scope and elected not to switch to smaller scope at the end of the procedure due to the increased food in the stomach frequently covering the opening 5. otherwise within normal limits but stomach not well evaluated due to food as  above  RECOMMENDATIONS: slowly advance diet see how dilation works call me when necessary and repeat dilation when necessary          and if doing well can follow-up in 6 months  REPEAT EXAM:  as needed  eSigned:  Clarene Essex, MD 08/10/2014 9:30 AM    PY:[id number] Benay Spice, MD  CPT CODES: ICD CODES:  The ICD and CPT codes recommended by this software are interpretations from the data that the clinical staff has captured with the software.  The verification of the translation of this report to the ICD and CPT codes and modifiers is the sole responsibility of the health care institution and practicing physician where this report was generated.  [facility]. will not be held responsible for the validity of the ICD and CPT codes included on this report.  AMA assumes no liability for data contained or not contained herein. CPT is a Designer, television/film set of the [employer].  PATIENT NAME:  Candra, Wegner MR#: [id number]

## 2014-08-13 ENCOUNTER — Encounter (HOSPITAL_COMMUNITY): Payer: Self-pay | Admitting: Gastroenterology

## 2014-08-17 ENCOUNTER — Telehealth: Payer: Self-pay | Admitting: Nurse Practitioner

## 2014-08-17 NOTE — Telephone Encounter (Signed)
Cannot take zoloft and paxil can only be on one

## 2014-08-17 NOTE — Telephone Encounter (Signed)
Patient states that she has been on zoloft for a month and she states that she still feels real nervous in the evening and shaky some in the am. She wants to know if you could add a low dose paxil for her to take in the morning.

## 2014-08-20 NOTE — Telephone Encounter (Signed)
Returning nurse call.  Call her back today at [phone] at [place].

## 2014-08-21 ENCOUNTER — Telehealth: Payer: Self-pay | Admitting: Nurse Practitioner

## 2014-08-21 DIAGNOSIS — F411 Generalized anxiety disorder: Secondary | ICD-10-CM

## 2014-08-23 MED ORDER — ALPRAZOLAM 0.5 MG PO TABS: ORAL_TABLET | ORAL | Status: DC

## 2014-08-23 NOTE — Telephone Encounter (Signed)
Please call in xanax 0.5 mg 1/2 to1 po Tid prn #90  with 1 refills

## 2014-08-23 NOTE — Telephone Encounter (Signed)
[facility], pt aware.

## 2014-08-23 NOTE — Telephone Encounter (Signed)
Pt aware to only take zoloft.

## 2014-08-27 ENCOUNTER — Telehealth: Payer: Self-pay | Admitting: Nurse Practitioner

## 2014-08-27 NOTE — Telephone Encounter (Signed)
I would rather you not increase xanax until we have time to talk

## 2014-08-28 NOTE — Telephone Encounter (Signed)
Pt aware and given appt tomorrow morning with MMM to discuss xanax rx.

## 2014-08-29 ENCOUNTER — Encounter: Payer: Self-pay | Admitting: Nurse Practitioner

## 2014-08-29 ENCOUNTER — Ambulatory Visit (INDEPENDENT_AMBULATORY_CARE_PROVIDER_SITE_OTHER): Payer: PPO | Admitting: Nurse Practitioner

## 2014-08-29 VITALS — BP 115/69 | HR 74 | Temp 97.0°F | Ht 65.0 in | Wt 99.0 lb

## 2014-08-29 DIAGNOSIS — F411 Generalized anxiety disorder: Secondary | ICD-10-CM

## 2014-08-29 MED ORDER — SERTRALINE HCL 100 MG PO TABS: 100.0000 mg | ORAL_TABLET | Freq: Every day | ORAL | Status: DC

## 2014-08-29 NOTE — Patient Instructions (Signed)
If you are taking a controlled substance please remember the conditions agreed upon in your Controlled-Substance Contract. - It is the responsibility of each patient to secure and handle these medications.  This includes taking them as prescribed by your physician.  Our office is not responsible for lost or stolen medication and in such instances replacement prescription may not be provided.   - Controlled substances for anxiety, sleep / insomnia, pain and attention deficient disorders carry a risk of developing dependence.  Also your medical provider must continue to weigh the risk of side effects with the justification of improvement when using these medications.  Please discuss any concerns about dependence or side effects with your provider.  - No refills will be given after hours or on weekends -Use of abusive language or threatening behavior toward our office or clinical staff will not be tolerated and may result in dismissal from medical care at [facility]. -Our office reserves the right to conduct unannounced and periodic urine or blood screenings for patients taking controlled substances.  -You should bring your medications to each visit.  Pill counts are sometimes conducted and failure to do so might result in postponement of prescription / refills. -You agree to maintain a close relationship with your provider by keeping appointments, reporting problems and taking your medication as prescribed.  If you receive controlled substances from and outside provider (ie dentist, emergency room visit, etc) it is your responsibility to make sure that your provider is aware of any additional prescriptions.  - Our office also checks the controlled substance database regularly.     

## 2014-08-29 NOTE — Progress Notes (Signed)
   Subjective:    Patient ID: Brittany Hensley, female    DOB: 10-02-1950, 64 y.o.   MRN: [id number]  HPI Patient is in today to discuss her xanax dosage- She requested a refill but wanted dose increased- She has been having lots of stress with her health and just recently had a balloon dilitation of esophagus. Due to this she took 2 0.5mg  xanax and that really helped. She would like to increase dose to 1 mg BID. She is also on zoloft 50mg  daily.    Review of Systems  Constitutional: Negative.   HENT: Negative.   Respiratory: Negative.   Cardiovascular: Negative.   Genitourinary: Negative.   Neurological: Negative.   Psychiatric/Behavioral: Negative.   All other systems reviewed and are negative.      Objective:   Physical Exam  Constitutional: She is oriented to person, place, and time. She appears well-developed and well-nourished.  Cardiovascular: Normal rate, regular rhythm and normal heart sounds.   Pulmonary/Chest: Effort normal and breath sounds normal.  Neurological: She is alert and oriented to person, place, and time.  Skin: Skin is warm and dry.  Psychiatric: She has a normal mood and affect. Her behavior is normal. Judgment and thought content normal.   BP 115/69 mmHg  Pulse 74  Temp(Src) 97 F (36.1 C) (Oral)  Ht 5\' 5"  (1.651 m)  Wt 99 lb (44.906 kg)  BMI 16.47 kg/m2        Assessment & Plan:   1. GAD (generalized anxiety disorder)    Meds ordered this encounter  Medications  . sertraline (ZOLOFT) 100 MG tablet    Sig: Take 1 tablet (100 mg total) by mouth daily.    Dispense:  30 tablet    Refill:  3    Order Specific Question:  Supervising Provider    Answer:  Chipper Herb [[id number]]   Discussed increasing zoloft dose rather than increasing xanax. Told patient that I would rather her not increase xanax Stress management discussed RTO in 1-2 months  Mary-Margaret Hassell Done, FNP

## 2014-09-06 ENCOUNTER — Ambulatory Visit: Admitting: Nurse Practitioner

## 2014-09-10 ENCOUNTER — Encounter: Payer: Self-pay | Admitting: Nurse Practitioner

## 2014-09-10 ENCOUNTER — Telehealth: Payer: Self-pay | Admitting: Nurse Practitioner

## 2014-09-10 NOTE — Telephone Encounter (Signed)
Letter ready for pick up

## 2014-09-10 NOTE — Telephone Encounter (Signed)
Left detailed message on voicemail that letter up front ready for pick up

## 2014-09-10 NOTE — Telephone Encounter (Signed)
Please advise 

## 2014-09-17 ENCOUNTER — Ambulatory Visit: Payer: Medicare Other | Admitting: Nurse Practitioner

## 2014-09-22 ENCOUNTER — Ambulatory Visit (INDEPENDENT_AMBULATORY_CARE_PROVIDER_SITE_OTHER): Payer: PPO | Admitting: Family Medicine

## 2014-09-22 ENCOUNTER — Telehealth: Payer: Self-pay | Admitting: Nurse Practitioner

## 2014-09-22 VITALS — BP 120/67 | HR 85 | Temp 97.8°F | Ht 65.0 in | Wt 99.2 lb

## 2014-09-22 DIAGNOSIS — J329 Chronic sinusitis, unspecified: Secondary | ICD-10-CM

## 2014-09-22 DIAGNOSIS — J40 Bronchitis, not specified as acute or chronic: Secondary | ICD-10-CM

## 2014-09-22 MED ORDER — BETAMETHASONE SOD PHOS & ACET 6 (3-3) MG/ML IJ SUSP
6.0000 mg | Freq: Once | INTRAMUSCULAR | Status: AC
  Administered 2014-09-22: 6 mg via INTRAMUSCULAR

## 2014-09-22 MED ORDER — LEVOFLOXACIN 500 MG PO TABS: 500.0000 mg | ORAL_TABLET | Freq: Every day | ORAL | Status: DC

## 2014-09-22 NOTE — Progress Notes (Signed)
Subjective:  Patient ID: Brittany Hensley, female    DOB: 1951-03-28  Age: 64 y.o. MRN: [id number]  CC: Cough; facial pressure; and pressure in ears   HPI ARALY KAAS presents for Sinus Pain: Patient complains of congestion, facial pain, headache described as frontal, moderate pressure, nasal congestion, no  fever and non productive cough. Symptoms include congestion, facial pain, nasal congestion, no  fever, white  productive cough, post nasal drip and sinus pressure with no fever, chills, night sweats or weight loss. Onset of symptoms was 4 days ago, gradually worsening since that time. She is drinking moderate amounts of fluids.  Past history is significant for sinus infections and migraines.    History Tekila has a past medical history of Anemia; Hypothyroidism; Anxiety; Diverticulitis; Rosacea; and Kidney stones.   She has past surgical history that includes Tubal ligation; Cholecystectomy open (1979); Gastrectomy [phone]); Esophagogastroduodenoscopy (06/16/2011); Balloon dilation (06/16/2011); Gastrectomy (N/A); Colonoscopy; Lithotripsy; Fracture surgery; Appendectomy; Esophagogastroduodenoscopy (N/A, 10/03/2013); Balloon dilation (N/A, 10/03/2013); Esophagogastroduodenoscopy (N/A, 08/10/2014); Balloon dilation (N/A, 08/10/2014); and Savory dilation (N/A, 08/10/2014).   Her family history includes Cancer - Prostate in her brother; Diabetes in her mother.She reports that she has never smoked. She has never used smokeless tobacco. She reports that she does not drink alcohol or use illicit drugs.  Current Outpatient Prescriptions on File Prior to Visit  Medication Sig Dispense Refill  . ALPRAZolam (XANAX) 0.5 MG tablet TAKE 1/2 TO 1 TABLET EVERY 8 TO 12 HOURSAS NEEDED 90 tablet 1  . CALCIUM CITRATE PO Take 600 mg by mouth 3 (three) times daily.    . cholecalciferol (VITAMIN D) 1000 UNITS tablet Take 1,000 Units by mouth daily.      . ergocalciferol (DRISDOL) 8000 UNIT/ML drops Place  4,000 Units under the tongue daily.    . fish oil-omega-3 fatty acids 1000 MG capsule Take 1 g by mouth daily.      . fluocinonide gel (LIDEX) 0.05 %     . levothyroxine (SYNTHROID, LEVOTHROID) 88 MCG tablet Take 1 tablet (88 mcg total) by mouth daily before breakfast. 30 tablet 5  . loratadine (CLARITIN) 10 MG tablet Take 10 mg by mouth 2 (two) times daily.      . magnesium gluconate (MAGONATE) 500 MG tablet Take 250 mg by mouth 2 (two) times daily.      . metroNIDAZOLE (METROGEL) 1 % gel Apply 1 application topically daily. 45 g 3  . Multiple Vitamin (MULTIVITAMIN) capsule Take 1 capsule by mouth 2 (two) times daily.      Marland Kitchen omeprazole (PRILOSEC) 40 MG capsule TAKE ONE (1) CAPSULE EACH DAY 30 capsule 2  . sertraline (ZOLOFT) 100 MG tablet Take 1 tablet (100 mg total) by mouth daily. 30 tablet 3  . sucralfate (CARAFATE) 1 G tablet Take 1 tablet by mouth 2 (two) times daily.    . vitamin C (ASCORBIC ACID) 500 MG tablet Take 500 mg by mouth 2 (two) times daily.       No current facility-administered medications on file prior to visit.    ROS Review of Systems  Constitutional: Negative for fever, chills, diaphoresis, appetite change and fatigue.  HENT: Positive for congestion, ear pain, hearing loss, postnasal drip, rhinorrhea, sinus pressure and sore throat. Negative for trouble swallowing.   Respiratory: Negative for cough, chest tightness and shortness of breath.   Cardiovascular: Negative for chest pain and palpitations.  Gastrointestinal: Negative for abdominal pain.  Musculoskeletal: Negative for arthralgias.  Skin: Negative for rash.  Objective:  BP 120/67 mmHg  Pulse 85  Temp(Src) 97.8 F (36.6 C) (Oral)  Ht 5\' 5"  (1.651 m)  Wt 99 lb 3.2 oz (44.997 kg)  BMI 16.51 kg/m2  BP Readings from Last 3 Encounters:  09/22/14 120/67  08/29/14 115/69  08/10/14 104/53    Wt Readings from Last 3 Encounters:  09/22/14 99 lb 3.2 oz (44.997 kg)  08/29/14 99 lb (44.906 kg)  06/14/14  95 lb 3.2 oz (43.182 kg)     Physical Exam  Constitutional: She is oriented to person, place, and time. She appears well-developed and well-nourished. No distress.  HENT:  Head: Normocephalic and atraumatic.  Right Ear: External ear normal.  Left Ear: External ear normal.  Nose: Nose normal.  Mouth/Throat: Oropharynx is clear and moist.  Eyes: Conjunctivae and EOM are normal. Pupils are equal, round, and reactive to light.  Neck: Normal range of motion. Neck supple. No thyromegaly present.  Cardiovascular: Normal rate, regular rhythm and normal heart sounds.   No murmur heard. Pulmonary/Chest: Effort normal. No respiratory distress. She has wheezes. She has no rales (faint with bronchoalveolar character).  Abdominal: Soft. Bowel sounds are normal. She exhibits no distension. There is no tenderness.  Lymphadenopathy:    She has no cervical adenopathy.  Neurological: She is alert and oriented to person, place, and time. She has normal reflexes.  Skin: Skin is warm and dry.  Psychiatric: She has a normal mood and affect. Her behavior is normal. Judgment and thought content normal.      Lab Results  Component Value Date   WBC 4.5 07/19/2014   HGB 11.3* 07/19/2014   HCT 36.7 07/19/2014   PLT 270 07/19/2014   GLUCOSE 119* 06/14/2014   CHOL 190 06/14/2014   TRIG 122 06/14/2014   HDL 58 06/14/2014   LDLCALC 98 10/24/2013   ALT 8 06/14/2014   AST 18 06/14/2014   NA 145* 06/14/2014   K 4.6 06/14/2014   CL 113* 06/14/2014   CREATININE 1.33* 06/14/2014   BUN 17 06/14/2014   CO2 17* 06/14/2014   TSH 2.060 06/14/2014   INR 0.9 06/08/2013      Assessment & Plan:   Mahum was seen today for cough, facial pressure and pressure in ears.  Diagnoses and all orders for this visit:  Sinobronchitis Orders: -     betamethasone acetate-betamethasone sodium phosphate (CELESTONE) injection 6 mg; Inject 1 mL (6 mg total) into the muscle once.  Other orders -     levofloxacin  (LEVAQUIN) 500 MG tablet; Take 1 tablet (500 mg total) by mouth daily.  I am having Ms. Decock start on levofloxacin. I am also having her maintain her loratadine, fish oil-omega-3 fatty acids, multivitamin, magnesium gluconate, vitamin C, cholecalciferol, sucralfate, ergocalciferol, CALCIUM CITRATE PO, metroNIDAZOLE, fluocinonide gel, omeprazole, levothyroxine, ALPRAZolam, and sertraline. We will continue to administer betamethasone acetate-betamethasone sodium phosphate.  Meds ordered this encounter  Medications  . betamethasone acetate-betamethasone sodium phosphate (CELESTONE) injection 6 mg    Sig:   . levofloxacin (LEVAQUIN) 500 MG tablet    Sig: Take 1 tablet (500 mg total) by mouth daily.    Dispense:  10 tablet    Refill:  0    Follow-up: Return if symptoms worsen or fail to improve.  Claretta Fraise, M.D.

## 2014-09-22 NOTE — Telephone Encounter (Signed)
Patient aware mmm does not have a 4pm appointment on the 26th. Patient was advised that she could call back toward the middle of the week to see if she has a cancellation.

## 2014-09-25 ENCOUNTER — Telehealth: Payer: Self-pay | Admitting: Nurse Practitioner

## 2014-09-25 NOTE — Telephone Encounter (Signed)
Pt given appt with MMM 2/26 at 3:15 instead of 2:15.

## 2014-09-28 ENCOUNTER — Ambulatory Visit (INDEPENDENT_AMBULATORY_CARE_PROVIDER_SITE_OTHER): Payer: PPO | Admitting: Nurse Practitioner

## 2014-09-28 ENCOUNTER — Encounter: Payer: Self-pay | Admitting: Nurse Practitioner

## 2014-09-28 VITALS — BP 123/68 | HR 95 | Temp 97.4°F | Ht 65.0 in | Wt 97.0 lb

## 2014-09-28 DIAGNOSIS — D509 Iron deficiency anemia, unspecified: Secondary | ICD-10-CM | POA: Diagnosis not present

## 2014-09-28 DIAGNOSIS — E038 Other specified hypothyroidism: Secondary | ICD-10-CM | POA: Diagnosis not present

## 2014-09-28 DIAGNOSIS — K219 Gastro-esophageal reflux disease without esophagitis: Secondary | ICD-10-CM

## 2014-09-28 DIAGNOSIS — E034 Atrophy of thyroid (acquired): Secondary | ICD-10-CM

## 2014-09-28 DIAGNOSIS — E876 Hypokalemia: Secondary | ICD-10-CM

## 2014-09-28 DIAGNOSIS — F411 Generalized anxiety disorder: Secondary | ICD-10-CM

## 2014-09-28 MED ORDER — SERTRALINE HCL 100 MG PO TABS: 100.0000 mg | ORAL_TABLET | Freq: Every day | ORAL | Status: DC

## 2014-09-28 MED ORDER — ALPRAZOLAM 0.5 MG PO TABS: ORAL_TABLET | ORAL | Status: DC

## 2014-09-28 NOTE — Addendum Note (Signed)
Addended by: Chevis Pretty on: 09/28/2014 04:25 PM   Modules accepted: Orders

## 2014-09-28 NOTE — Progress Notes (Signed)
Subjective:    Patient ID: Brittany Hensley, female    DOB: 10/12/1950, 64 y.o.   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 Learta Codding- Just had Hgb checked Oct ober first and it was good. GERD Sees Dr. Watt Climes- 3 x a year- recently had ballon dilitation of stomach- narrowing from gastrectomy Hypothyroidism Doing well on current meds- no complaints  Rosacea Metrogel- sees Dr. Syble Creek- currently under control Kidney stones Patient hasn't been taking- Dr. Jeffie Pollock started her on this to prevent kidney stones but patient not taking GAD Patient currently taking xanax- takes care of her mother and takes 3 x a day- last rx not written correctly. Recently started on zoloft which is helping with her stress. Would like to increase zoloft. Malabsorption problems due to gastrectomy Takes a lot of supplements but not sure she is absorbing everything she needs. Her weight is fluctuating but can never seem to hold on to the pounds.  Review of Systems  Constitutional: Negative.   HENT: Negative.   Eyes: Negative.   Respiratory: Negative.   Cardiovascular: Negative.   Gastrointestinal: Negative.   Endocrine: Negative.   Genitourinary: Negative.   Musculoskeletal: Negative.   Allergic/Immunologic: Negative.   Neurological: Negative.   Hematological: Negative.   Psychiatric/Behavioral: Positive for agitation. Negative for suicidal ideas and sleep disturbance. The patient is nervous/anxious.   All other systems reviewed and are negative.      Objective:   Physical Exam  Constitutional: She is oriented to person, place, and time. She appears well-developed and well-nourished.  HENT:  Right Ear: Hearing, tympanic membrane, external ear and ear canal normal.  Left Ear: Hearing, tympanic membrane,  external ear and ear canal normal.  Nose: Mucosal edema and rhinorrhea present.  Mouth/Throat: Uvula is midline, oropharynx is clear and moist and mucous membranes are normal.  Eyes: EOM are normal.  Neck: Trachea normal, normal range of motion and full passive range of motion without pain. Neck supple. No JVD present. Carotid bruit is not present. No thyromegaly present.  Cardiovascular: Normal rate, regular rhythm, normal heart sounds and intact distal pulses.  Exam reveals no gallop and no friction rub.   No murmur heard. Pulmonary/Chest: Effort normal and breath sounds normal.  Abdominal: Soft. Bowel sounds are normal. She exhibits no distension and no mass. There is no tenderness.  Musculoskeletal: Normal range of motion.  Lymphadenopathy:    She has no cervical adenopathy.  Neurological: She is alert and oriented to person, place, and time. She has normal reflexes.  Skin: Skin is warm and dry. There is pallor.  Psychiatric: She has a normal mood and affect. Her behavior is normal. Judgment and thought content normal.    BP 123/68 mmHg  Pulse 95  Temp(Src) 97.4 F (36.3 C) (Oral)  Ht 5' 5"  (1.651 m)  Wt 97 lb (43.999 kg)  BMI 16.14 kg/m2        Assessment & Plan:   1. Gastroesophageal reflux disease without esophagitis   2. Hypothyroidism due to acquired atrophy of thyroid   3. Iron deficiency anemia   4. Hypokalemia    Orders Placed This Encounter  Procedures  . Thyroid Panel With TSH  . Anemia Profile B  . CMP14+EGFR  . NMR, lipoprofile    Meds ordered this encounter  Medications  . ALPRAZolam (XANAX) 0.5 MG tablet    Sig: TAKE 1/2 TO 1 TABLET EVERY 8 TO 12 HOURSAS NEEDED    Dispense:  90 tablet    Refill:  1    Order Specific Question:  Supervising Provider    Answer:  Chipper Herb [[id number]]  . sertraline (ZOLOFT) 100 MG tablet    Sig: Take 1 tablet (100 mg total) by mouth daily.    Dispense:  30 tablet    Refill:  5    Order Specific Question:   Supervising Provider    Answer:  Chipper Herb [[id number]]    contiune to increase calories daily Keep follow up with GI Labs pending Follow up in 3 months  [place], FNP

## 2014-09-28 NOTE — Patient Instructions (Signed)

## 2014-09-29 LAB — ANEMIA PROFILE B
Basophils Absolute: 0 10*3/uL (ref 0.0–0.2)
Basos: 1 %
Eos: 2 %
Eosinophils Absolute: 0.1 10*3/uL (ref 0.0–0.4)
Ferritin: 114 ng/mL (ref 15–150)
HEMATOCRIT: 34.7 % (ref 34.0–46.6)
Hemoglobin: 11.3 g/dL (ref 11.1–15.9)
Immature Grans (Abs): 0 10*3/uL (ref 0.0–0.1)
Immature Granulocytes: 0 %
Iron Saturation: 16 % (ref 15–55)
Iron: 39 ug/dL (ref 27–139)
Lymphocytes Absolute: 1.1 10*3/uL (ref 0.7–3.1)
Lymphs: 20 %
MCH: 31.3 pg (ref 26.6–33.0)
MCHC: 32.6 g/dL (ref 31.5–35.7)
MCV: 96 fL (ref 79–97)
Monocytes Absolute: 0.6 10*3/uL (ref 0.1–0.9)
Monocytes: 10 %
Neutrophils Absolute: 3.7 10*3/uL (ref 1.4–7.0)
Neutrophils Relative %: 67 %
Platelets: 309 10*3/uL (ref 150–379)
RBC: 3.61 x10E6/uL — ABNORMAL LOW (ref 3.77–5.28)
RDW: 14.6 % (ref 12.3–15.4)
Retic Ct Pct: 1.6 % (ref 0.6–2.6)
TIBC: 240 ug/dL — AB (ref 250–450)
UIBC: 201 ug/dL (ref 118–369)
WBC: 5.4 10*3/uL (ref 3.4–10.8)

## 2014-09-29 LAB — CMP14+EGFR
A/G RATIO: 1.8 (ref 1.1–2.5)
ALBUMIN: 3.8 g/dL (ref 3.6–4.8)
ALK PHOS: 46 IU/L (ref 39–117)
ALT: 11 IU/L (ref 0–32)
AST: 19 IU/L (ref 0–40)
BUN/Creatinine Ratio: 22 (ref 11–26)
BUN: 25 mg/dL (ref 8–27)
Bilirubin Total: <0.2 mg/dL (ref 0.0–1.2)
CHLORIDE: 109 mmol/L — AB (ref 97–108)
CO2: 20 mmol/L (ref 18–29)
Calcium: 9 mg/dL (ref 8.7–10.3)
Creatinine, Ser: 1.13 mg/dL — ABNORMAL HIGH (ref 0.57–1.00)
GFR calc non Af Amer: 51 mL/min/{1.73_m2} — ABNORMAL LOW (ref >59)
GFR, EST AFRICAN AMERICAN: 59 mL/min/{1.73_m2} — AB (ref >59)
Globulin, Total: 2.1 g/dL (ref 1.5–4.5)
Glucose: 98 mg/dL (ref 65–99)
Potassium: 4.6 mmol/L (ref 3.5–5.2)
Sodium: 145 mmol/L — ABNORMAL HIGH (ref 134–144)
Total Protein: 5.9 g/dL — ABNORMAL LOW (ref 6.0–8.5)

## 2014-09-29 LAB — NMR, LIPOPROFILE
Cholesterol: 159 mg/dL (ref 100–199)
HDL Cholesterol by NMR: 54 mg/dL (ref >39)
HDL Particle Number: 37.5 umol/L (ref >=30.5)
LDL Particle Number: 923 nmol/L (ref <1000)
LDL Size: 21.5 nm (ref >20.5)
LDL-C: 86 mg/dL (ref 0–99)
LP-IR Score: 37 (ref <=45)
Small LDL Particle Number: 286 nmol/L (ref <=527)
Triglycerides by NMR: 96 mg/dL (ref 0–149)

## 2014-09-29 LAB — THYROID PANEL WITH TSH
Free Thyroxine Index: 0.8 — ABNORMAL LOW (ref 1.2–4.9)
T3 Uptake Ratio: 26 % (ref 24–39)
T4, Total: 3 ug/dL — ABNORMAL LOW (ref 4.5–12.0)
TSH: 2.22 u[IU]/mL (ref 0.450–4.500)

## 2014-09-30 ENCOUNTER — Other Ambulatory Visit: Payer: Self-pay | Admitting: Family Medicine

## 2014-10-18 ENCOUNTER — Other Ambulatory Visit (HOSPITAL_BASED_OUTPATIENT_CLINIC_OR_DEPARTMENT_OTHER): Payer: PPO

## 2014-10-18 ENCOUNTER — Other Ambulatory Visit: Payer: Self-pay | Admitting: Nurse Practitioner

## 2014-10-18 ENCOUNTER — Ambulatory Visit (HOSPITAL_BASED_OUTPATIENT_CLINIC_OR_DEPARTMENT_OTHER): Payer: PPO | Admitting: Oncology

## 2014-10-18 ENCOUNTER — Telehealth: Payer: Self-pay | Admitting: Oncology

## 2014-10-18 VITALS — BP 101/57 | HR 76 | Temp 97.9°F | Resp 18 | Ht 65.0 in | Wt 98.2 lb

## 2014-10-18 DIAGNOSIS — D509 Iron deficiency anemia, unspecified: Secondary | ICD-10-CM

## 2014-10-18 DIAGNOSIS — D500 Iron deficiency anemia secondary to blood loss (chronic): Secondary | ICD-10-CM | POA: Diagnosis not present

## 2014-10-18 LAB — CBC WITH DIFFERENTIAL/PLATELET
BASO%: 0.9 % (ref 0.0–2.0)
BASOS ABS: 0 10*3/uL (ref 0.0–0.1)
EOS ABS: 0.2 10*3/uL (ref 0.0–0.5)
EOS%: 5.1 % (ref 0.0–7.0)
HCT: 34 % — ABNORMAL LOW (ref 34.8–46.6)
HEMOGLOBIN: 10.4 g/dL — AB (ref 11.6–15.9)
LYMPH%: 28 % (ref 14.0–49.7)
MCH: 30.4 pg (ref 25.1–34.0)
MCHC: 30.7 g/dL — ABNORMAL LOW (ref 31.5–36.0)
MCV: 99.1 fL (ref 79.5–101.0)
MONO#: 0.6 10*3/uL (ref 0.1–0.9)
MONO%: 13.9 % (ref 0.0–14.0)
NEUT#: 2.1 10*3/uL (ref 1.5–6.5)
NEUT%: 52.1 % (ref 38.4–76.8)
Platelets: 295 10*3/uL (ref 145–400)
RBC: 3.43 10*6/uL — AB (ref 3.70–5.45)
RDW: 16.5 % — AB (ref 11.2–14.5)
WBC: 4.1 10*3/uL (ref 3.9–10.3)
lymph#: 1.1 10*3/uL (ref 0.9–3.3)

## 2014-10-18 NOTE — Telephone Encounter (Signed)
Gave and printed appt sched and avs for pt for April and Aug

## 2014-10-18 NOTE — Progress Notes (Signed)
  [facility] OFFICE PROGRESS NOTE   Diagnosis: Iron deficiency anemia  INTERVAL HISTORY:   Brittany Hensley returns as scheduled. She reports being diagnosed with "bronchitis "last month. The ferritin returned at 114 on 09/28/2014. She underwent a repeat balloon dilatation of the jejunum by Dr. Watt Climes on 08/10/2014. She reports significant improvement in dysphagia and regurgitation following this procedure. She denies bleeding.  Objective:  Vital signs in last 24 hours:  Blood pressure 101/57, pulse 76, temperature 97.9 F (36.6 C), temperature source Oral, resp. rate 18, height 5\' 5"  (1.651 m), weight 98 lb 3.2 oz (44.543 kg), SpO2 100 %.    Resp: Lungs clear bilaterally Cardio: Regular rate and rhythm GI: No hepatomegaly, nontender Vascular: No leg edema   Lab Results:  Lab Results  Component Value Date   WBC 4.1 10/18/2014   HGB 10.4* 10/18/2014   HCT 34.0* 10/18/2014   MCV 99.1 10/18/2014   PLT 295 10/18/2014   NEUTROABS 2.1 10/18/2014    Medications: I have reviewed the patient's current medications.  Assessment/Plan: 1. Chronic anemia secondary to gastrointestinal blood loss and iron deficiency, she last received IV iron 01/18/2014. 2. History of kidney stones, followed by Dr. Roni Bread. 3. Gram-negative sepsis and septic shock syndrome April 2008. 4. History of acute renal failure secondary to obstruction and septic shock. 5. History of adrenal insufficiency, now maintained off of steroid hormone replacement. She is followed by Dr. Loanne Drilling. 6. Questionable allergic to Venofer in June 2010. She reported an elevated blood pressure and "dizziness" on the day following the Venofer therapy. She has tolerated iron dextran without an apparent reaction. 7. History of a left forearm fracture.  8. Intermittent low-serum bicarbonate level, ? related to diarrhea or renal insufficiency 9. Dysphagia, improved with erythromycin and esophageal dilatation procedures , status  post dilatation of the gastric jejunal anastomosis 08/10/2014, no longer taking erythromycin   Disposition:  The ferritin level was adequate last month. The hemoglobin is slightly lower today. She will return for a CBC and ferritin level in approximate 6 weeks. We will administer IV iron as indicated. She will contact us for symptoms of anemia. Brittany Hensley will return for an office visit in 5-6 months.  Betsy Coder, MD  10/18/2014  10:41 AM

## 2014-11-16 ENCOUNTER — Telehealth: Payer: Self-pay | Admitting: *Deleted

## 2014-11-16 ENCOUNTER — Telehealth: Payer: Self-pay | Admitting: Oncology

## 2014-11-16 NOTE — Telephone Encounter (Signed)
Patient called requesting appointment for labs to be moved a week earlier.  "I'm feeling more tired.  Would like to come in earlier and then they can arrange treatment earlier than planned..  Would like a morning appointment."  Denies any bleeding.  Call transferred to scheduler voicemail.  Will also send P.O.F.

## 2014-11-16 NOTE — Telephone Encounter (Signed)
Confirm appointment for lab appointment 04/18.

## 2014-11-19 ENCOUNTER — Other Ambulatory Visit (HOSPITAL_BASED_OUTPATIENT_CLINIC_OR_DEPARTMENT_OTHER): Payer: PPO

## 2014-11-19 ENCOUNTER — Telehealth: Payer: Self-pay | Admitting: *Deleted

## 2014-11-19 ENCOUNTER — Other Ambulatory Visit: Payer: Self-pay | Admitting: *Deleted

## 2014-11-19 DIAGNOSIS — D509 Iron deficiency anemia, unspecified: Secondary | ICD-10-CM

## 2014-11-19 LAB — CBC WITH DIFFERENTIAL/PLATELET
BASO%: 1.4 % (ref 0.0–2.0)
Basophils Absolute: 0.1 10*3/uL (ref 0.0–0.1)
EOS%: 5.8 % (ref 0.0–7.0)
Eosinophils Absolute: 0.2 10*3/uL (ref 0.0–0.5)
HEMATOCRIT: 35 % (ref 34.8–46.6)
HEMOGLOBIN: 11 g/dL — AB (ref 11.6–15.9)
LYMPH%: 27.4 % (ref 14.0–49.7)
MCH: 30.2 pg (ref 25.1–34.0)
MCHC: 31.5 g/dL (ref 31.5–36.0)
MCV: 95.8 fL (ref 79.5–101.0)
MONO#: 0.6 10*3/uL (ref 0.1–0.9)
MONO%: 12.9 % (ref 0.0–14.0)
NEUT#: 2.3 10*3/uL (ref 1.5–6.5)
NEUT%: 52.5 % (ref 38.4–76.8)
Platelets: 266 10*3/uL (ref 145–400)
RBC: 3.65 10*6/uL — ABNORMAL LOW (ref 3.70–5.45)
RDW: 15.7 % — ABNORMAL HIGH (ref 11.2–14.5)
WBC: 4.3 10*3/uL (ref 3.9–10.3)
lymph#: 1.2 10*3/uL (ref 0.9–3.3)

## 2014-11-19 LAB — FERRITIN CHCC: FERRITIN: 36 ng/mL (ref 9–269)

## 2014-11-19 NOTE — Telephone Encounter (Signed)
Spoke with pt, she reports she has been feeling "sluggish." Informed her the schedulers will call with appointment for iron infusion. She voiced understanding.

## 2014-11-20 ENCOUNTER — Telehealth: Payer: Self-pay | Admitting: Oncology

## 2014-11-20 NOTE — Telephone Encounter (Signed)
Called and left a message with her iron appointments

## 2014-11-20 NOTE — Telephone Encounter (Signed)
Pt called back to confirm IV Iron apts for 04/22 and 04/29, advised pt to p/u calendar from chemo room, KJ

## 2014-11-23 ENCOUNTER — Other Ambulatory Visit: Payer: Self-pay | Admitting: Oncology

## 2014-11-23 ENCOUNTER — Ambulatory Visit (HOSPITAL_BASED_OUTPATIENT_CLINIC_OR_DEPARTMENT_OTHER): Payer: PPO

## 2014-11-23 VITALS — BP 120/59 | HR 69 | Temp 98.6°F | Resp 18

## 2014-11-23 DIAGNOSIS — D500 Iron deficiency anemia secondary to blood loss (chronic): Secondary | ICD-10-CM

## 2014-11-23 DIAGNOSIS — D509 Iron deficiency anemia, unspecified: Secondary | ICD-10-CM

## 2014-11-23 MED ORDER — ACETAMINOPHEN 325 MG PO TABS
650.0000 mg | ORAL_TABLET | Freq: Once | ORAL | Status: AC
  Administered 2014-11-23: 650 mg via ORAL

## 2014-11-23 MED ORDER — SODIUM CHLORIDE 0.9 % IV SOLN
Freq: Once | INTRAVENOUS | Status: AC
  Administered 2014-11-23: 11:00:00 via INTRAVENOUS

## 2014-11-23 MED ORDER — ACETAMINOPHEN 325 MG PO TABS
ORAL_TABLET | ORAL | Status: AC
  Filled 2014-11-23: qty 2

## 2014-11-23 MED ORDER — DIPHENHYDRAMINE HCL 25 MG PO TABS
50.0000 mg | ORAL_TABLET | Freq: Once | ORAL | Status: AC
  Administered 2014-11-23: 50 mg via ORAL
  Filled 2014-11-23: qty 2

## 2014-11-23 MED ORDER — SODIUM CHLORIDE 0.9 % IV SOLN
1400.0000 mg | Freq: Once | INTRAVENOUS | Status: AC
  Administered 2014-11-23: 1400 mg via INTRAVENOUS
  Filled 2014-11-23: qty 28

## 2014-11-23 MED ORDER — DIPHENHYDRAMINE HCL 25 MG PO CAPS
ORAL_CAPSULE | ORAL | Status: AC
  Filled 2014-11-23: qty 2

## 2014-11-23 MED ORDER — SODIUM CHLORIDE 0.9 % IV SOLN
50.0000 mg | Freq: Once | INTRAVENOUS | Status: AC
  Administered 2014-11-23: 50 mg via INTRAVENOUS
  Filled 2014-11-23: qty 1

## 2014-11-23 NOTE — Patient Instructions (Signed)
Iron Deficiency Anemia Anemia is a condition in which there are less red blood cells or hemoglobin in the blood than normal. Hemoglobin is the part of red blood cells that carries oxygen. Iron deficiency anemia is anemia caused by too little iron. It is the most common type of anemia. It may leave you tired and short of breath. CAUSES   Lack of iron in the diet.  Poor absorption of iron, as seen with intestinal disorders.  Intestinal bleeding.  Heavy periods. SIGNS AND SYMPTOMS  Mild anemia may not be noticeable. Symptoms may include:  Fatigue.  Headache.  Pale skin.  Weakness.  Tiredness.  Shortness of breath.  Dizziness.  Cold hands and feet.  Fast or irregular heartbeat. DIAGNOSIS  Diagnosis requires a thorough evaluation and physical exam by your health care provider. Blood tests are generally used to confirm iron deficiency anemia. Additional tests may be done to find the underlying cause of your anemia. These may include:  Testing for blood in the stool (fecal occult blood test).  A procedure to see inside the colon and rectum (colonoscopy).  A procedure to see inside the esophagus and stomach (endoscopy). TREATMENT  Iron deficiency anemia is treated by correcting the cause of the deficiency. Treatment may involve:  Adding iron-rich foods to your diet.  Taking iron supplements. Pregnant or breastfeeding women need to take extra iron because their normal diet usually does not provide the required amount.  Taking vitamins. Vitamin C improves the absorption of iron. Your health care provider may recommend that you take your iron tablets with a glass of orange juice or vitamin C supplement.  Medicines to make heavy menstrual flow lighter.  Surgery. HOME CARE INSTRUCTIONS   Take iron as directed by your health care provider.  If you cannot tolerate taking iron supplements by mouth, talk to your health care provider about taking them through a vein  (intravenously) or an injection into a muscle.  For the best iron absorption, iron supplements should be taken on an empty stomach. If you cannot tolerate them on an empty stomach, you may need to take them with food.  Do not drink milk or take antacids at the same time as your iron supplements. Milk and antacids may interfere with the absorption of iron.  Iron supplements can cause constipation. Make sure to include fiber in your diet to prevent constipation. A stool softener may also be recommended.  Take vitamins as directed by your health care provider.  Eat a diet rich in iron. Foods high in iron include liver, lean beef, whole-grain bread, eggs, dried fruit, and dark green leafy vegetables. SEEK IMMEDIATE MEDICAL CARE IF:   You faint. If this happens, do not drive. Call your local emergency services (911 in [place] if no other help is available.  You have chest pain.  You feel nauseous or vomit.  You have severe or increased shortness of breath with activity.  You feel weak.  You have a rapid heartbeat.  You have unexplained sweating.  You become light-headed when getting up from a chair or bed. MAKE SURE YOU:   Understand these instructions.  Will watch your condition.  Will get help right away if you are not doing well or get worse. Document Released: 07/17/2000 Document Revised: 07/25/2013 Document Reviewed: 03/27/2013 Exit[employer] Patient Information 2015 [employer]. This information is not intended to replace advice given to you by your health care provider. Make sure you discuss any questions you have with your health care provider.  

## 2014-11-29 ENCOUNTER — Other Ambulatory Visit: Payer: PPO

## 2014-11-30 ENCOUNTER — Ambulatory Visit: Payer: PPO

## 2014-12-14 ENCOUNTER — Other Ambulatory Visit: Payer: Self-pay | Admitting: Nurse Practitioner

## 2015-01-03 ENCOUNTER — Encounter: Payer: Self-pay | Admitting: Nurse Practitioner

## 2015-01-03 ENCOUNTER — Ambulatory Visit (INDEPENDENT_AMBULATORY_CARE_PROVIDER_SITE_OTHER): Payer: PPO

## 2015-01-03 ENCOUNTER — Other Ambulatory Visit: Payer: Self-pay | Admitting: Nurse Practitioner

## 2015-01-03 ENCOUNTER — Ambulatory Visit (INDEPENDENT_AMBULATORY_CARE_PROVIDER_SITE_OTHER): Payer: PPO | Admitting: Nurse Practitioner

## 2015-01-03 VITALS — BP 132/88 | HR 84 | Temp 97.7°F | Ht 65.0 in | Wt 96.0 lb

## 2015-01-03 DIAGNOSIS — E034 Atrophy of thyroid (acquired): Secondary | ICD-10-CM | POA: Diagnosis not present

## 2015-01-03 DIAGNOSIS — F411 Generalized anxiety disorder: Secondary | ICD-10-CM

## 2015-01-03 DIAGNOSIS — E038 Other specified hypothyroidism: Secondary | ICD-10-CM

## 2015-01-03 DIAGNOSIS — K912 Postsurgical malabsorption, not elsewhere classified: Secondary | ICD-10-CM

## 2015-01-03 DIAGNOSIS — Z903 Acquired absence of stomach [part of]: Secondary | ICD-10-CM

## 2015-01-03 DIAGNOSIS — E876 Hypokalemia: Secondary | ICD-10-CM

## 2015-01-03 DIAGNOSIS — D509 Iron deficiency anemia, unspecified: Secondary | ICD-10-CM | POA: Diagnosis not present

## 2015-01-03 DIAGNOSIS — L719 Rosacea, unspecified: Secondary | ICD-10-CM

## 2015-01-03 DIAGNOSIS — K219 Gastro-esophageal reflux disease without esophagitis: Secondary | ICD-10-CM

## 2015-01-03 MED ORDER — METRONIDAZOLE 1 % EX GEL: 1.0000 "application " | Freq: Every day | CUTANEOUS | Status: DC

## 2015-01-03 MED ORDER — LEVOTHYROXINE SODIUM 88 MCG PO TABS: 88.0000 ug | ORAL_TABLET | Freq: Every morning | ORAL | Status: DC

## 2015-01-03 MED ORDER — ALPRAZOLAM 0.5 MG PO TABS: ORAL_TABLET | ORAL | Status: DC

## 2015-01-03 MED ORDER — SERTRALINE HCL 100 MG PO TABS: 100.0000 mg | ORAL_TABLET | Freq: Every day | ORAL | Status: DC

## 2015-01-03 MED ORDER — OMEPRAZOLE 40 MG PO CPDR: DELAYED_RELEASE_CAPSULE | ORAL | Status: DC

## 2015-01-03 NOTE — Progress Notes (Signed)
Subjective:    Patient ID: Brittany Hensley, female    DOB: 04-27-1951, 64 y.o.   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 Learta Codding- Just had Hgb checked Oct ober first and it was good. GERD Sees Dr. Watt Climes- 3 x a year- recently had ballon dilitation of stomach- narrowing from gastrectomy Hypothyroidism Doing well on current meds- no complaints  Rosacea Metrogel- sees Dr. Syble Creek- currently under control Kidney stones Patient hasn't been taking- Dr. Jeffie Pollock started her on this to prevent kidney stones but patient not taking GAD Patient currently taking xanax- takes care of her mother and takes 3 x a day- last rx not written correctly. Recently started on zoloft which is helping with her stress. Would like to increase zoloft. Malabsorption problems due to gastrectomy Takes a lot of supplements but not sure she is absorbing everything she needs. Her weight is fluctuating but can never seem to hold on to the pounds.  Review of Systems  Constitutional: Negative.   HENT: Negative.   Eyes: Negative.   Respiratory: Negative.   Cardiovascular: Negative.   Gastrointestinal: Negative.   Endocrine: Negative.   Genitourinary: Negative.   Musculoskeletal: Negative.   Allergic/Immunologic: Negative.   Neurological: Negative.   Hematological: Negative.   Psychiatric/Behavioral: Positive for agitation. Negative for suicidal ideas and sleep disturbance. The patient is nervous/anxious.   All other systems reviewed and are negative.      Objective:   Physical Exam  Constitutional: She is oriented to person, place, and time. She appears well-developed and well-nourished.  HENT:  Right Ear: Hearing, tympanic membrane, external ear and ear canal normal.  Left Ear: Hearing, tympanic membrane,  external ear and ear canal normal.  Nose: Mucosal edema and rhinorrhea present.  Mouth/Throat: Uvula is midline, oropharynx is clear and moist and mucous membranes are normal.  Eyes: EOM are normal.  Neck: Trachea normal, normal range of motion and full passive range of motion without pain. Neck supple. No JVD present. Carotid bruit is not present. No thyromegaly present.  Cardiovascular: Normal rate, regular rhythm, normal heart sounds and intact distal pulses.  Exam reveals no gallop and no friction rub.   No murmur heard. Pulmonary/Chest: Effort normal and breath sounds normal.  Abdominal: Soft. Bowel sounds are normal. She exhibits no distension and no mass. There is no tenderness.  Musculoskeletal: Normal range of motion.  Lymphadenopathy:    She has no cervical adenopathy.  Neurological: She is alert and oriented to person, place, and time. She has normal reflexes.  Skin: Skin is warm and dry. There is pallor.  Psychiatric: She has a normal mood and affect. Her behavior is normal. Judgment and thought content normal.   BP 132/88 mmHg  Pulse 84  Temp(Src) 97.7 F (36.5 C) (Oral)  Ht 5' 5"  (1.651 m)  Wt 96 lb (43.545 kg)  BMI 15.98 kg/m2  Chest x ray- normal-Preliminary reading by Ronnald Collum, FNP  [facility]   EKG- Kerry Hough, FNP          Assessment & Plan:   1. Gastroesophageal reflux disease without esophagitis Avoid spicy foods Do not eat 2 hours prior to bedtime - omeprazole (PRILOSEC) 40 MG capsule; TAKE ONE (1) CAPSULE EACH DAY  Dispense: 30 capsule; Refill: 5  2. Hypothyroidism due to acquired  atrophy of thyroid - levothyroxine (SYNTHROID, LEVOTHROID) 88 MCG tablet; Take 1 tablet (88 mcg total) by mouth every morning.  Dispense: 30 tablet; Refill: 5 - Thyroid Panel With TSH - DG Chest 2 View; Future  3. Rosacea - metroNIDAZOLE (METROGEL) 1 % gel; Apply 1 application topically daily.  Dispense: 45 g; Refill: 3  4. Iron deficiency anemia  5.  Hypokalemia - CMP14+EGFR - EKG 12-Lead  6. Intestinal malabsorption following gastrectomy - NMR, lipoprofile  7. GAD (generalized anxiety disorder) Stress management - ALPRAZolam (XANAX) 0.5 MG tablet; TAKE 1/2 TO 1 TABLET EVERY 8 TO 12 HOURSAS NEEDED  Dispense: 90 tablet; Refill: 1 - sertraline (ZOLOFT) 100 MG tablet; Take 1 tablet (100 mg total) by mouth daily.  Dispense: 30 tablet; Refill: 5    Labs pending Health maintenance reviewed Diet and exercise encouraged Continue all meds Follow up  In 6 months   [place], FNP

## 2015-01-03 NOTE — Patient Instructions (Signed)
Fat and Cholesterol Control Diet Fat and cholesterol levels in your blood and organs are influenced by your diet. High levels of fat and cholesterol may lead to diseases of the heart, small and large blood vessels, gallbladder, liver, and pancreas. CONTROLLING FAT AND CHOLESTEROL WITH DIET Although exercise and lifestyle factors are important, your diet is key. That is because certain foods are known to raise cholesterol and others to lower it. The goal is to balance foods for their effect on cholesterol and more importantly, to replace saturated and trans fat with other types of fat, such as monounsaturated fat, polyunsaturated fat, and omega-3 fatty acids. On average, a person should consume no more than 15 to 17 g of saturated fat daily. Saturated and trans fats are considered "bad" fats, and they will raise LDL cholesterol. Saturated fats are primarily found in animal products such as meats, butter, and cream. However, that does not mean you need to give up all your favorite foods. Today, there are good tasting, low-fat, low-cholesterol substitutes for most of the things you like to eat. Choose low-fat or nonfat alternatives. Choose round or loin cuts of red meat. These types of cuts are lowest in fat and cholesterol. Chicken (without the skin), fish, veal, and ground turkey breast are great choices. Eliminate fatty meats, such as hot dogs and salami. Even shellfish have little or no saturated fat. Have a 3 oz (85 g) portion when you eat lean meat, poultry, or fish. Trans fats are also called "partially hydrogenated oils." They are oils that have been scientifically manipulated so that they are solid at room temperature resulting in a longer shelf life and improved taste and texture of foods in which they are added. Trans fats are found in stick margarine, some tub margarines, cookies, crackers, and baked goods.  When baking and cooking, oils are a great substitute for butter. The monounsaturated oils are  especially beneficial since it is believed they lower LDL and raise HDL. The oils you should avoid entirely are saturated tropical oils, such as coconut and palm.  Remember to eat a lot from food groups that are naturally free of saturated and trans fat, including fish, fruit, vegetables, beans, grains (barley, rice, couscous, bulgur wheat), and pasta (without cream sauces).  IDENTIFYING FOODS THAT LOWER FAT AND CHOLESTEROL  Soluble fiber may lower your cholesterol. This type of fiber is found in fruits such as apples, vegetables such as broccoli, potatoes, and carrots, legumes such as beans, peas, and lentils, and grains such as barley. Foods fortified with plant sterols (phytosterol) may also lower cholesterol. You should eat at least 2 g per day of these foods for a cholesterol lowering effect.  Read package labels to identify low-saturated fats, trans fat free, and low-fat foods at the supermarket. Select cheeses that have only 2 to 3 g saturated fat per ounce. Use a heart-healthy tub margarine that is free of trans fats or partially hydrogenated oil. When buying baked goods (cookies, crackers), avoid partially hydrogenated oils. Breads and muffins should be made from whole grains (whole-wheat or whole oat flour, instead of "flour" or "enriched flour"). Buy non-creamy canned soups with reduced salt and no added fats.  FOOD PREPARATION TECHNIQUES  Never deep-fry. If you must fry, either stir-fry, which uses very little fat, or use non-stick cooking sprays. When possible, broil, bake, or roast meats, and steam vegetables. Instead of putting butter or margarine on vegetables, use lemon and herbs, applesauce, and cinnamon (for squash and sweet potatoes). Use nonfat   yogurt, salsa, and low-fat dressings for salads.  LOW-SATURATED FAT / LOW-FAT FOOD SUBSTITUTES Meats / Saturated Fat (g)  Avoid: Steak, marbled (3 oz/85 g) / 11 g  Choose: Steak, lean (3 oz/85 g) / 4 g  Avoid: Hamburger (3 oz/85 g) / 7  g  Choose: Hamburger, lean (3 oz/85 g) / 5 g  Avoid: Ham (3 oz/85 g) / 6 g  Choose: Ham, lean cut (3 oz/85 g) / 2.4 g  Avoid: Chicken, with skin, dark meat (3 oz/85 g) / 4 g  Choose: Chicken, skin removed, dark meat (3 oz/85 g) / 2 g  Avoid: Chicken, with skin, light meat (3 oz/85 g) / 2.5 g  Choose: Chicken, skin removed, light meat (3 oz/85 g) / 1 g Dairy / Saturated Fat (g)  Avoid: Whole milk (1 cup) / 5 g  Choose: Low-fat milk, 2% (1 cup) / 3 g  Choose: Low-fat milk, 1% (1 cup) / 1.5 g  Choose: Skim milk (1 cup) / 0.3 g  Avoid: Hard cheese (1 oz/28 g) / 6 g  Choose: Skim milk cheese (1 oz/28 g) / 2 to 3 g  Avoid: Cottage cheese, 4% fat (1 cup) / 6.5 g  Choose: Low-fat cottage cheese, 1% fat (1 cup) / 1.5 g  Avoid: Ice cream (1 cup) / 9 g  Choose: Sherbet (1 cup) / 2.5 g  Choose: Nonfat frozen yogurt (1 cup) / 0.3 g  Choose: Frozen fruit bar / trace  Avoid: Whipped cream (1 tbs) / 3.5 g  Choose: Nondairy whipped topping (1 tbs) / 1 g Condiments / Saturated Fat (g)  Avoid: Mayonnaise (1 tbs) / 2 g  Choose: Low-fat mayonnaise (1 tbs) / 1 g  Avoid: Butter (1 tbs) / 7 g  Choose: Extra light margarine (1 tbs) / 1 g  Avoid: Coconut oil (1 tbs) / 11.8 g  Choose: Olive oil (1 tbs) / 1.8 g  Choose: Corn oil (1 tbs) / 1.7 g  Choose: Safflower oil (1 tbs) / 1.2 g  Choose: Sunflower oil (1 tbs) / 1.4 g  Choose: Soybean oil (1 tbs) / 2.4 g  Choose: Canola oil (1 tbs) / 1 g Document Released: 07/20/2005 Document Revised: 11/14/2012 Document Reviewed: 10/18/2013 [employer] Patient Information 2015 [employer]. This information is not intended to replace advice given to you by your health care provider. Make sure you discuss any questions you have with your health care provider.  

## 2015-02-08 ENCOUNTER — Encounter (HOSPITAL_COMMUNITY): Payer: Self-pay | Admitting: *Deleted

## 2015-02-08 ENCOUNTER — Other Ambulatory Visit: Payer: Self-pay | Admitting: Gastroenterology

## 2015-02-08 NOTE — Addendum Note (Signed)
Addended by: Clarene Essex on: 02/08/2015 11:59 AM   Modules accepted: Orders

## 2015-02-15 ENCOUNTER — Ambulatory Visit (HOSPITAL_COMMUNITY)
Admission: RE | Admit: 2015-02-15 | Discharge: 2015-02-15 | Disposition: A | Discharge status: Home / Self Care | Payer: PPO | Source: Ambulatory Visit | Attending: Gastroenterology | Admitting: Gastroenterology

## 2015-02-15 ENCOUNTER — Encounter (HOSPITAL_COMMUNITY)
Admission: RE | Disposition: A | Discharge status: Home / Self Care | Payer: Self-pay | Source: Ambulatory Visit | Attending: Gastroenterology

## 2015-02-15 ENCOUNTER — Ambulatory Visit (HOSPITAL_COMMUNITY): Payer: PPO | Admitting: Anesthesiology

## 2015-02-15 ENCOUNTER — Encounter (HOSPITAL_COMMUNITY): Payer: Self-pay

## 2015-02-15 DIAGNOSIS — Z980 Intestinal bypass and anastomosis status: Secondary | ICD-10-CM | POA: Insufficient documentation

## 2015-02-15 DIAGNOSIS — F419 Anxiety disorder, unspecified: Secondary | ICD-10-CM | POA: Diagnosis not present

## 2015-02-15 DIAGNOSIS — K449 Diaphragmatic hernia without obstruction or gangrene: Secondary | ICD-10-CM | POA: Insufficient documentation

## 2015-02-15 DIAGNOSIS — K219 Gastro-esophageal reflux disease without esophagitis: Secondary | ICD-10-CM | POA: Diagnosis not present

## 2015-02-15 DIAGNOSIS — K9189 Other postprocedural complications and disorders of digestive system: Secondary | ICD-10-CM | POA: Diagnosis not present

## 2015-02-15 DIAGNOSIS — Z792 Long term (current) use of antibiotics: Secondary | ICD-10-CM | POA: Diagnosis not present

## 2015-02-15 DIAGNOSIS — D500 Iron deficiency anemia secondary to blood loss (chronic): Secondary | ICD-10-CM | POA: Diagnosis not present

## 2015-02-15 DIAGNOSIS — F329 Major depressive disorder, single episode, unspecified: Secondary | ICD-10-CM | POA: Diagnosis not present

## 2015-02-15 DIAGNOSIS — M1990 Unspecified osteoarthritis, unspecified site: Secondary | ICD-10-CM | POA: Diagnosis not present

## 2015-02-15 DIAGNOSIS — E039 Hypothyroidism, unspecified: Secondary | ICD-10-CM | POA: Diagnosis not present

## 2015-02-15 DIAGNOSIS — K625 Hemorrhage of anus and rectum: Secondary | ICD-10-CM | POA: Insufficient documentation

## 2015-02-15 DIAGNOSIS — K311 Adult hypertrophic pyloric stenosis: Secondary | ICD-10-CM | POA: Insufficient documentation

## 2015-02-15 DIAGNOSIS — Z79899 Other long term (current) drug therapy: Secondary | ICD-10-CM | POA: Insufficient documentation

## 2015-02-15 DIAGNOSIS — R1310 Dysphagia, unspecified: Secondary | ICD-10-CM | POA: Diagnosis present

## 2015-02-15 HISTORY — PX: ESOPHAGOGASTRODUODENOSCOPY (EGD) WITH PROPOFOL: SHX5813

## 2015-02-15 HISTORY — PX: BALLOON DILATION: SHX5330

## 2015-02-15 SURGERY — ESOPHAGOGASTRODUODENOSCOPY (EGD) WITH PROPOFOL
Anesthesia: Monitor Anesthesia Care

## 2015-02-15 MED ORDER — PROMETHAZINE HCL 25 MG/ML IJ SOLN
INTRAMUSCULAR | Status: AC
  Filled 2015-02-15: qty 1

## 2015-02-15 MED ORDER — PROPOFOL 10 MG/ML IV BOLUS
INTRAVENOUS | Status: AC
  Filled 2015-02-15: qty 20

## 2015-02-15 MED ORDER — SODIUM CHLORIDE 0.9 % IV SOLN: INTRAVENOUS | Status: DC

## 2015-02-15 MED ORDER — MIDAZOLAM HCL 2 MG/2ML IJ SOLN
0.5000 mg | Freq: Once | INTRAMUSCULAR | Status: DC | PRN
  Filled 2015-02-15: qty 2

## 2015-02-15 MED ORDER — MEPERIDINE HCL 100 MG/ML IJ SOLN: 6.2500 mg | INTRAMUSCULAR | Status: DC | PRN

## 2015-02-15 MED ORDER — PROPOFOL 10 MG/ML IV BOLUS
INTRAVENOUS | Status: DC | PRN
  Administered 2015-02-15 (×2): 20 mg via INTRAVENOUS
  Administered 2015-02-15: 10 mg via INTRAVENOUS
  Administered 2015-02-15 (×2): 20 mg via INTRAVENOUS
  Administered 2015-02-15: 50 mg via INTRAVENOUS

## 2015-02-15 MED ORDER — FENTANYL CITRATE (PF) 100 MCG/2ML IJ SOLN: 25.0000 ug | INTRAMUSCULAR | Status: DC | PRN

## 2015-02-15 MED ORDER — PROPOFOL INFUSION 10 MG/ML OPTIME
INTRAVENOUS | Status: DC | PRN
  Administered 2015-02-15: 75 ug/kg/min via INTRAVENOUS

## 2015-02-15 MED ORDER — ONDANSETRON HCL 4 MG/2ML IJ SOLN
INTRAMUSCULAR | Status: AC
  Filled 2015-02-15: qty 2

## 2015-02-15 MED ORDER — ONDANSETRON HCL 4 MG/2ML IJ SOLN
INTRAMUSCULAR | Status: DC | PRN
  Administered 2015-02-15: 4 mg via INTRAVENOUS

## 2015-02-15 MED ORDER — LACTATED RINGERS IV SOLN
INTRAVENOUS | Status: DC
  Administered 2015-02-15: 1000 mL via INTRAVENOUS

## 2015-02-15 MED ORDER — PROMETHAZINE HCL 25 MG/ML IJ SOLN
6.2500 mg | INTRAMUSCULAR | Status: DC | PRN
  Administered 2015-02-15: 6.25 mg via INTRAVENOUS

## 2015-02-15 SURGICAL SUPPLY — 15 items

## 2015-02-15 NOTE — Discharge Instructions (Addendum)
Call if question or problem otherwise follow-up in 4-6 weeks to discuss surgical options and see how dilation workedEsophagogastroduodenoscopy Care After Refer to this sheet in the next few weeks. These instructions provide you with information on caring for yourself after your procedure. Your caregiver may also give you more specific instructions. Your treatment has been planned according to current medical practices, but problems sometimes occur. Call your caregiver if you have any problems or questions after your procedure.  HOME CARE INSTRUCTIONS  Do not eat or drink anything until the numbing medicine (local anesthetic) has worn off and your gag reflex has returned. You will know that the local anesthetic has worn off when you can swallow comfortably.  Do not drive for 12 hours after the procedure or as directed by your caregiver.  Only take medicines as directed by your caregiver. SEEK MEDICAL CARE IF:   You cannot stop coughing.  You are not urinating at all or less than usual. SEEK IMMEDIATE MEDICAL CARE IF:  You have difficulty swallowing.  You cannot eat or drink.  You have worsening throat or chest pain.  You have dizziness, lightheadedness, or you faint.  You have nausea or vomiting.  You have chills.  You have a fever.  You have severe abdominal pain.  You have black, tarry, or bloody stools. Document Released: 07/06/2012 Document Reviewed: 07/06/2012 [facility] Patient Information 2015 [place]. This information is not intended to replace advice given to you by your health care provider. Make sure you discuss any questions you have with your health care provider.

## 2015-02-15 NOTE — Transfer of Care (Signed)
Immediate Anesthesia Transfer of Care Note  Patient: Brittany Hensley  Procedure(s) Performed: Procedure(s): ESOPHAGOGASTRODUODENOSCOPY (EGD) WITH PROPOFOL (N/A) BALLOON DILATION (N/A)  Patient Location: PACU  Anesthesia Type:MAC  Level of Consciousness:  sedated, patient cooperative and responds to stimulation  Airway & Oxygen Therapy:Patient Spontanous Breathing and Patient connected to face mask oxgen  Post-op Assessment:  Report given to PACU RN and Post -op Vital signs reviewed and stable  Post vital signs:  Reviewed and stable  Last Vitals:  Filed Vitals:   02/15/15 1040  BP: 95/55  Pulse: 70  Temp: 36.7 C  Resp: 12    Complications: No apparent anesthesia complications

## 2015-02-15 NOTE — Anesthesia Preprocedure Evaluation (Addendum)
Anesthesia Evaluation  Patient identified by MRN, date of birth, ID band Patient awake    Reviewed: Allergy & Precautions, NPO status , Patient's Chart, lab work & pertinent test results  History of Anesthesia Complications Negative for: history of anesthetic complications  Airway Mallampati: II  TM Distance: >3 FB Neck ROM: Full    Dental  (+) Caps, Dental Advisory Given   Pulmonary neg pulmonary ROS,  breath sounds clear to auscultation        Cardiovascular - anginanegative cardio ROS  + dysrhythmias (remote h/o re-entry SVT, off of meds now) Rhythm:Regular Rate:Normal  '11 ECHO: EF 55-60%, valves OK   Neuro/Psych negative neurological ROS     GI/Hepatic Neg liver ROS, GERD-  Medicated and Controlled,  Endo/Other  Hypothyroidism   Renal/GU H/o stones     Musculoskeletal   Abdominal   Peds  Hematology  (+) Blood dyscrasia (h/o anemia), ,   Anesthesia Other Findings   Reproductive/Obstetrics                           Anesthesia Physical Anesthesia Plan  ASA: II  Anesthesia Plan: MAC   Post-op Pain Management:    Induction: Intravenous  Airway Management Planned: Natural Airway and Simple Face Mask  Additional Equipment:   Intra-op Plan:   Post-operative Plan:   Informed Consent: I have reviewed the patients History and Physical, chart, labs and discussed the procedure including the risks, benefits and alternatives for the proposed anesthesia with the patient or authorized representative who has indicated his/her understanding and acceptance.   Dental advisory given  Plan Discussed with: CRNA and Surgeon  Anesthesia Plan Comments: (Plan routine monitors, MAC)        Anesthesia Quick Evaluation

## 2015-02-15 NOTE — Anesthesia Postprocedure Evaluation (Signed)
  Anesthesia Post-op Note  Patient: Brittany Hensley  Procedure(s) Performed: Procedure(s): ESOPHAGOGASTRODUODENOSCOPY (EGD) WITH PROPOFOL (N/A) BALLOON DILATION (N/A)  Patient Location: Endoscopy Unit  Anesthesia Type:MAC  Level of Consciousness: awake, alert , oriented, patient cooperative and responds to stimulation  Airway and Oxygen Therapy: Patient Spontanous Breathing and Patient connected to nasal cannula oxygen  Post-op Pain: none  Post-op Assessment: Post-op Vital signs reviewed, Patient's Cardiovascular Status Stable, Respiratory Function Stable, Patent Airway, No signs of Nausea or vomiting and Pain level controlled              Post-op Vital Signs: Reviewed and stable  Last Vitals:  Filed Vitals:   02/15/15 1330  BP: 107/59  Pulse: 69  Temp:   Resp: 13    Complications: No apparent anesthesia complications

## 2015-02-15 NOTE — Progress Notes (Signed)
Brittany Hensley 11:26 AM  Subjective: Patient without any new complaints since I saw her recently in the office and we rediscussed surgical options as well  Objective: Vital signs stable afebrile no acute distress exam please see preassessment evaluation  Assessment: Gastric outlet obstruction secondary to anastomotic stricture  Plan: Okay to proceed with endoscopy probable balloon dilation with anesthesia assistance  [facility]  Pager [phone] After 5PM or if no answer call [phone]

## 2015-02-15 NOTE — Op Note (Addendum)
[facility] [place], [address]   ENDOSCOPY PROCEDURE REPORT  PATIENT: Brittany Hensley, Brittany Hensley  MR#: #[id number] BIRTHDATE: 07-01-1951 , 83  yrs. old GENDER: female ENDOSCOPIST: Clarene Essex, MD REFERRED BY:  Arturo Morton, M.D. PROCEDURE DATE:  02/15/2015 PROCEDURE:  EGD w/ transendoscopic intraluminal tube or catheter placement for anastomotic balloon dilatation ASA CLASS:     Class III INDICATIONS:  history of reflux esophagitis.gastric outlet obstruction MEDICATIONS: Propofol 300 mg IV TOPICAL ANESTHETIC: none  DESCRIPTION OF PROCEDURE: After the risks benefits and alternatives of the procedure were thoroughly explained, informed consent was obtained.  The Pentax Gastroscope [id number] endoscope was introduced through the mouth and advanced to the small bowel limb , Without limitations.  The instrument was slowly withdrawn as the mucosa was fully examined. Estimated blood loss is zero unless otherwise noted in this procedure report.    the findings are recorded below       Retroflexed views revealed no abnormalities.     The scope was then withdrawn from the patient and the procedure completed.  COMPLICATIONS: There were no immediate complications.  ENDOSCOPIC IMPRESSION: 1. Small hiatal hernia 2. Significant old food in the stomach limits endoscopic evaluation unable to washed and suctioned 3. Anastomotic scarring and stricturing with multiple diverticuli and only able to find 1 lumen which was initially dilated from 10-12 mm using the wire-guided balloon   and we were able to then advance the scope and on slow withdrawal no significant trauma was seen however due to the increased food we no longer had very good visualization of the anastomosis so we rolled her on her back and elevated her head and were able to re-advance the scope past the anastomosis but still could only find the one limb due to some food debris and we went ahead and dilated  that limb to 16.5 mm using the 8 cm balloon which was advanced into the small bowel endoscopically and the scope and the balloon were withdrawn back to the stomach and  the balloon was initially inflated to 15 mm   and then inflated further to 16.5. We then tried again to find the other limb but were unsuccessful due to the increase food and elected to stop the procedure 3. Otherwise within normal limits down one limb as above  RECOMMENDATIONS: observe for delayed complications liquids only today call me when necessary otherwise follow-up in 4-6 weeks to recheck symptoms and rediscuss surgical options which I think she needs and expect the surgeon would want any upper GI to better delineate the anatomy  REPEAT EXAM: as needed  eSigned:  Clarene Essex, MD 02/15/2015 12:59 PM Revised: 02/15/2015 12:59 PM   HM:[id number] Benay Spice, MD  CPT CODES: ICD CODES:  The ICD and CPT codes recommended by this software are interpretations from the data that the clinical staff has captured with the software.  The verification of the translation of this report to the ICD and CPT codes and modifiers is the sole responsibility of the health care institution and practicing physician where this report was generated.  [employer] will not be held responsible for the validity of the ICD and CPT codes included on this report.  AMA assumes no liability for data contained or not contained herein. CPT is a Designer, television/film set of the [employer].  PATIENT NAME:  Brittany, Hensley MR#: #[id number]

## 2015-02-18 ENCOUNTER — Encounter (HOSPITAL_COMMUNITY): Payer: Self-pay | Admitting: Gastroenterology

## 2015-03-02 ENCOUNTER — Other Ambulatory Visit: Payer: Self-pay | Admitting: Nurse Practitioner

## 2015-03-04 NOTE — Telephone Encounter (Signed)
Please call in xanax with 1 refills 

## 2015-03-04 NOTE — Telephone Encounter (Signed)
Rx called in 

## 2015-03-04 NOTE — Telephone Encounter (Signed)
Last seen 01/03/15  MMM if approved route to nurse to call into [facility]

## 2015-03-28 ENCOUNTER — Ambulatory Visit (HOSPITAL_BASED_OUTPATIENT_CLINIC_OR_DEPARTMENT_OTHER): Payer: PPO | Admitting: Oncology

## 2015-03-28 ENCOUNTER — Other Ambulatory Visit (HOSPITAL_BASED_OUTPATIENT_CLINIC_OR_DEPARTMENT_OTHER): Payer: PPO

## 2015-03-28 ENCOUNTER — Telehealth: Payer: Self-pay | Admitting: Oncology

## 2015-03-28 VITALS — BP 126/62 | HR 78 | Temp 97.9°F | Resp 18 | Ht 64.0 in | Wt 96.1 lb

## 2015-03-28 DIAGNOSIS — D500 Iron deficiency anemia secondary to blood loss (chronic): Secondary | ICD-10-CM

## 2015-03-28 DIAGNOSIS — D509 Iron deficiency anemia, unspecified: Secondary | ICD-10-CM

## 2015-03-28 LAB — CBC WITH DIFFERENTIAL/PLATELET
BASO%: 1 % (ref 0.0–2.0)
Basophils Absolute: 0 10*3/uL (ref 0.0–0.1)
EOS ABS: 0.2 10*3/uL (ref 0.0–0.5)
EOS%: 3.9 % (ref 0.0–7.0)
HCT: 38.9 % (ref 34.8–46.6)
HEMOGLOBIN: 13 g/dL (ref 11.6–15.9)
LYMPH%: 31.6 % (ref 14.0–49.7)
MCH: 32.6 pg (ref 25.1–34.0)
MCHC: 33.5 g/dL (ref 31.5–36.0)
MCV: 97.3 fL (ref 79.5–101.0)
MONO#: 0.5 10*3/uL (ref 0.1–0.9)
MONO%: 11.2 % (ref 0.0–14.0)
NEUT#: 2.4 10*3/uL (ref 1.5–6.5)
NEUT%: 52.3 % (ref 38.4–76.8)
PLATELETS: 271 10*3/uL (ref 145–400)
RBC: 3.99 10*6/uL (ref 3.70–5.45)
RDW: 15.7 % — ABNORMAL HIGH (ref 11.2–14.5)
WBC: 4.7 10*3/uL (ref 3.9–10.3)
lymph#: 1.5 10*3/uL (ref 0.9–3.3)

## 2015-03-28 LAB — FERRITIN CHCC: Ferritin: 82 ng/ml (ref 9–269)

## 2015-03-28 NOTE — Progress Notes (Signed)
  [facility] OFFICE PROGRESS NOTE   Diagnosis: Iron deficiency anemia  INTERVAL HISTORY:   Ms. Bible returns as scheduled. She feels well. She underwent dilatation of an anastomotic stricture on 02/15/2015. She reports improvement in food regurgitation following this procedure. No bleeding. She last received IV iron on 11/23/2014. She tolerated the iron well. She does not feel anemic today.  Objective:  Vital signs in last 24 hours:  Blood pressure 126/62, pulse 78, temperature 97.9 F (36.6 C), temperature source Oral, resp. rate 18, height 5\' 4"  (1.626 m), weight 96 lb 1.6 oz (43.591 kg), SpO2 100 %.    Resp: Lungs clear bilaterally Cardio: Regular rate and rhythm GI: No hepatosplenomegaly, nontender, no mass Vascular: No leg edema   Lab Results:  Lab Results  Component Value Date   WBC 4.7 03/28/2015   HGB 13.0 03/28/2015   HCT 38.9 03/28/2015   MCV 97.3 03/28/2015   PLT 271 03/28/2015   NEUTROABS 2.4 03/28/2015     Medications: I have reviewed the patient's current medications.  Assessment/Plan: 1. Chronic anemia secondary to gastrointestinal blood loss and iron deficiency, she last received IV iron 11/23/2014 2. History of kidney stones, followed by Dr. Roni Bread. 3. Gram-negative sepsis and septic shock syndrome April 2008. 4. History of acute renal failure secondary to obstruction and septic shock. 5. History of adrenal insufficiency, now maintained off of steroid hormone replacement. She is followed by Dr. Loanne Drilling. 6. Questionable allergic to Venofer in June 2010. She reported an elevated blood pressure and "dizziness" on the day following the Venofer therapy. She has tolerated iron dextran without an apparent reaction. 7. History of a left forearm fracture.  8. Intermittent low-serum bicarbonate level, ? related to diarrhea or renal insufficiency 9. Dysphagia, improved with erythromycin and esophageal dilatation procedures , status post dilatation  of the gastric jejunal anastomosis 08/10/2014 and 02/15/2015    Disposition:  She appears stable. We will follow-up on the ferritin from today. She will return for a lab visit in 4 months and an office visit in 8 months. We will administer IV iron as needed.  Betsy Coder, MD  03/28/2015  11:25 AM

## 2015-03-28 NOTE — Telephone Encounter (Signed)
per pof to sch pt appt-gave pt copy of avs °

## 2015-03-29 ENCOUNTER — Telehealth: Payer: Self-pay | Admitting: Oncology

## 2015-03-29 ENCOUNTER — Telehealth: Payer: Self-pay | Admitting: *Deleted

## 2015-03-29 DIAGNOSIS — D509 Iron deficiency anemia, unspecified: Secondary | ICD-10-CM

## 2015-03-29 NOTE — Telephone Encounter (Signed)
-----   Message from Ladell Pier, MD sent at 03/28/2015  7:18 PM EDT ----- Please call patient, ferritin is ok, f/u as scheduled

## 2015-03-29 NOTE — Telephone Encounter (Signed)
S/w pt confirming labs add on per 08/26 POF.... Cherylann Banas

## 2015-03-29 NOTE — Telephone Encounter (Signed)
Per Dr. Benay Spice; notified pt that ferritin is ok (82), f/u as scheduled.  Pt verbalized understanding and requested ferritin to be re-checked in 1 month.  Per Dr. Benay Spice; okay to re-check in a month.

## 2015-04-01 ENCOUNTER — Other Ambulatory Visit: Payer: Self-pay | Admitting: Nurse Practitioner

## 2015-04-05 ENCOUNTER — Encounter: Payer: Self-pay | Admitting: Nurse Practitioner

## 2015-04-05 ENCOUNTER — Ambulatory Visit (INDEPENDENT_AMBULATORY_CARE_PROVIDER_SITE_OTHER): Payer: PPO | Admitting: Nurse Practitioner

## 2015-04-05 VITALS — BP 120/68 | HR 85 | Temp 97.3°F | Ht 64.0 in | Wt 95.0 lb

## 2015-04-05 DIAGNOSIS — K311 Adult hypertrophic pyloric stenosis: Secondary | ICD-10-CM

## 2015-04-05 DIAGNOSIS — E876 Hypokalemia: Secondary | ICD-10-CM | POA: Diagnosis not present

## 2015-04-05 DIAGNOSIS — E038 Other specified hypothyroidism: Secondary | ICD-10-CM

## 2015-04-05 DIAGNOSIS — D509 Iron deficiency anemia, unspecified: Secondary | ICD-10-CM | POA: Diagnosis not present

## 2015-04-05 DIAGNOSIS — E034 Atrophy of thyroid (acquired): Secondary | ICD-10-CM | POA: Diagnosis not present

## 2015-04-05 DIAGNOSIS — Z681 Body mass index (BMI) 19 or less, adult: Secondary | ICD-10-CM | POA: Diagnosis not present

## 2015-04-05 DIAGNOSIS — L719 Rosacea, unspecified: Secondary | ICD-10-CM

## 2015-04-05 DIAGNOSIS — K219 Gastro-esophageal reflux disease without esophagitis: Secondary | ICD-10-CM

## 2015-04-05 MED ORDER — METRONIDAZOLE 1 % EX GEL: 1.0000 "application " | Freq: Every day | CUTANEOUS | Status: DC

## 2015-04-05 MED ORDER — ALPRAZOLAM 0.5 MG PO TABS: ORAL_TABLET | ORAL | Status: DC

## 2015-04-05 NOTE — Patient Instructions (Signed)

## 2015-04-05 NOTE — Progress Notes (Signed)
Subjective:    Patient ID: Brittany Hensley, female    DOB: [birth year], 64 y.o.   MRN: [id number]  HPI    Review of Systems     Objective:   Physical Exam        Assessment & Plan:   Subjective:    Patient ID: Brittany Hensley, female    DOB: 06-30-1951, 64 y.o.   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 Learta Codding- Just had Hgb checked Oct ober first and it was good. GERD Sees Dr. Watt Climes- 3 x a year- recently had ballon dilitation of stomach- narrowing from gastrectomy Hypothyroidism Doing well on current meds- no complaints  Rosacea Metrogel- sees Dr. Syble Creek- currently under control Kidney stones Patient hasn't been taking- Dr. Jeffie Pollock started her on this to prevent kidney stones but patient not taking GAD Patient currently taking xanax- takes care of her mother and takes 3 x a day- last rx not written correctly. Recently started on zoloft which is helping with her stress. Would like to increase zoloft. Malabsorption problems due to gastrectomy Takes a lot of supplements but not sure she is absorbing everything she needs. Her weight is fluctuating but can never seem to hold on to the pounds.Had balloon dilitation  Of stomach this past July 15,2016 to increase size of stomach.  Review of Systems  Constitutional: Negative.   HENT: Negative.   Eyes: Negative.   Respiratory: Negative.   Cardiovascular: Negative.   Gastrointestinal: Negative.   Endocrine: Negative.   Genitourinary: Negative.   Musculoskeletal: Negative.   Allergic/Immunologic: Negative.   Neurological: Negative.   Hematological: Negative.   Psychiatric/Behavioral: Positive for agitation. Negative for suicidal ideas and sleep disturbance. The patient is nervous/anxious.   All other systems reviewed  and are negative.      Objective:   Physical Exam  Constitutional: She is oriented to person, place, and time. She appears well-developed and well-nourished.  HENT:  Right Ear: Hearing, tympanic membrane, external ear and ear canal normal.  Left Ear: Hearing, tympanic membrane, external ear and ear canal normal.  Nose: Mucosal edema and rhinorrhea present.  Mouth/Throat: Uvula is midline, oropharynx is clear and moist and mucous membranes are normal.  Eyes: EOM are normal.  Neck: Trachea normal, normal range of motion and full passive range of motion without pain. Neck supple. No JVD present. Carotid bruit is not present. No thyromegaly present.  Cardiovascular: Normal rate, regular rhythm, normal heart sounds and intact distal pulses.  Exam reveals no gallop and no friction rub.   No murmur heard. Pulmonary/Chest: Effort normal and breath sounds normal.  Abdominal: Soft. Bowel sounds are normal. She exhibits no distension and no mass. There is no tenderness.  Musculoskeletal: Normal range of motion.  Lymphadenopathy:    She has no cervical adenopathy.  Neurological: She is alert and oriented to person, place, and time. She has normal reflexes.  Skin: Skin is warm and dry. There is pallor.  Psychiatric: She has a normal mood and affect. Her behavior is normal. Judgment and thought content normal.   BP 120/68 mmHg  Pulse 85  Temp(Src) 97.3 F (36.3 C) (Oral)  Ht 5' 4"  (1.626 m)  Wt 95 lb (43.092 kg)  BMI 16.30 kg/m2  Assessment & Plan:  1. Gastroesophageal reflux disease without esophagitis Avoid spicy foods Do not eat 2 hours prior to bedtime   2. Hypothyroidism due to acquired atrophy of thyroid - Thyroid Panel With TSH  3. Rosacea  4. Iron deficiency anemia  5. Hypokalemia - CMP14+EGFR - Lipid panel  6. Gastric outlet obstruction Keep follow up with cardiologist  7. BMI less than 19,adult Increase caloric intact    Labs pending Health  maintenance reviewed Diet and exercise encouraged Continue all meds Follow up  In 3 month   [place], FNP

## 2015-04-06 LAB — THYROID PANEL WITH TSH
FREE THYROXINE INDEX: 1 — AB (ref 1.2–4.9)
T3 UPTAKE RATIO: 29 % (ref 24–39)
T4 TOTAL: 3.6 ug/dL — AB (ref 4.5–12.0)
TSH: 4.27 u[IU]/mL (ref 0.450–4.500)

## 2015-04-06 LAB — CMP14+EGFR
A/G RATIO: 2.2 (ref 1.1–2.5)
ALBUMIN: 4.1 g/dL (ref 3.6–4.8)
ALT: 20 IU/L (ref 0–32)
AST: 21 IU/L (ref 0–40)
Alkaline Phosphatase: 59 IU/L (ref 39–117)
BUN / CREAT RATIO: 26 (ref 11–26)
BUN: 25 mg/dL (ref 8–27)
CO2: 17 mmol/L — AB (ref 18–29)
CREATININE: 0.95 mg/dL (ref 0.57–1.00)
Calcium: 9.5 mg/dL (ref 8.7–10.3)
Chloride: 109 mmol/L — ABNORMAL HIGH (ref 97–108)
GFR calc Af Amer: 73 mL/min/{1.73_m2} (ref >59)
GFR calc non Af Amer: 63 mL/min/{1.73_m2} (ref >59)
Globulin, Total: 1.9 g/dL (ref 1.5–4.5)
Glucose: 97 mg/dL (ref 65–99)
Potassium: 3.9 mmol/L (ref 3.5–5.2)
Sodium: 142 mmol/L (ref 134–144)
Total Protein: 6 g/dL (ref 6.0–8.5)

## 2015-04-06 LAB — LIPID PANEL
Chol/HDL Ratio: 3.4 ratio units (ref 0.0–4.4)
Cholesterol, Total: 192 mg/dL (ref 100–199)
HDL: 57 mg/dL (ref >39)
LDL CALC: 105 mg/dL — AB (ref 0–99)
Triglycerides: 151 mg/dL — ABNORMAL HIGH (ref 0–149)
VLDL Cholesterol Cal: 30 mg/dL (ref 5–40)

## 2015-04-10 ENCOUNTER — Telehealth: Payer: Self-pay | Admitting: Oncology

## 2015-04-10 NOTE — Telephone Encounter (Signed)
Returned patients call to reschedule her lab

## 2015-04-30 ENCOUNTER — Other Ambulatory Visit (HOSPITAL_BASED_OUTPATIENT_CLINIC_OR_DEPARTMENT_OTHER): Payer: PPO

## 2015-04-30 DIAGNOSIS — D500 Iron deficiency anemia secondary to blood loss (chronic): Secondary | ICD-10-CM | POA: Diagnosis not present

## 2015-04-30 DIAGNOSIS — D509 Iron deficiency anemia, unspecified: Secondary | ICD-10-CM

## 2015-04-30 LAB — FERRITIN CHCC: FERRITIN: 62 ng/mL (ref 9–269)

## 2015-05-01 ENCOUNTER — Other Ambulatory Visit: Payer: Self-pay | Admitting: *Deleted

## 2015-05-01 ENCOUNTER — Telehealth: Payer: Self-pay | Admitting: *Deleted

## 2015-05-01 DIAGNOSIS — D509 Iron deficiency anemia, unspecified: Secondary | ICD-10-CM

## 2015-05-01 NOTE — Telephone Encounter (Signed)
Informed pt of ferritin level. She is uncomfortable waiting 3 mos to repeat ferritin. Requests to re-check in one month. Reviewed with Dr. Benay Spice: OK to check labs in one month. Order sent to schedulers to contact pt.

## 2015-05-01 NOTE — Telephone Encounter (Signed)
-----   Message from Ladell Pier, MD sent at 04/30/2015  7:53 PM EDT ----- Please call patient, ferritin is lower, but normal, repeat ferritin/cbc 3 months

## 2015-05-02 ENCOUNTER — Other Ambulatory Visit: Payer: PPO

## 2015-05-02 ENCOUNTER — Other Ambulatory Visit: Payer: Self-pay | Admitting: Nurse Practitioner

## 2015-05-03 ENCOUNTER — Telehealth: Payer: Self-pay | Admitting: Nurse Practitioner

## 2015-05-03 ENCOUNTER — Other Ambulatory Visit: Payer: PPO

## 2015-05-03 ENCOUNTER — Telehealth: Payer: Self-pay | Admitting: Oncology

## 2015-05-03 NOTE — Telephone Encounter (Signed)
Pt notified of RX She still has printed Rx for Xanax

## 2015-05-03 NOTE — Telephone Encounter (Signed)
She was given a prescription for xanax on 04/05/2015 at her last visit to be filled no sooner than 05/04/2015 for #90 tabs with one refill. I will not able to replace a lost prescription for this medicine because it is a controlled substance. If she doesn't have it she may have given it to the pharmacy if she had other prescriptions at that time.

## 2015-05-03 NOTE — Telephone Encounter (Signed)
Last filled 04/01/15, last seen 04/15/15. Route to pool if approved so [facility]

## 2015-05-03 NOTE — Telephone Encounter (Signed)
Lft msg for pt confirming labs added per 09/28 POF, mailed out schedule... KJ

## 2015-05-07 ENCOUNTER — Telehealth: Payer: Self-pay | Admitting: *Deleted

## 2015-05-07 DIAGNOSIS — D509 Iron deficiency anemia, unspecified: Secondary | ICD-10-CM

## 2015-05-07 NOTE — Telephone Encounter (Signed)
Pt called request that labs be re-checked sooner than 3 month d/t ferritin 62 "Dr. Benay Spice said if it gets below 50 I will need an iron infusion"  Per Dr. Benay Spice, informed pt we can re-check labs 10/28.  Pt verbalized understanding and expressed appreciation for call back.

## 2015-05-14 ENCOUNTER — Telehealth: Payer: Self-pay | Admitting: Oncology

## 2015-05-14 NOTE — Telephone Encounter (Signed)
Patient left message asking that 10/28 lab be moved from 11:30 am to 9 am due to another appointment conflict. Lab moved to 9:15 am. Returned call and left message for patient re change with new time for 10/28 lab at 9:15 am. Other appointments remain the same.

## 2015-05-31 ENCOUNTER — Other Ambulatory Visit (HOSPITAL_BASED_OUTPATIENT_CLINIC_OR_DEPARTMENT_OTHER): Payer: PPO

## 2015-05-31 ENCOUNTER — Telehealth: Payer: Self-pay | Admitting: *Deleted

## 2015-05-31 DIAGNOSIS — D500 Iron deficiency anemia secondary to blood loss (chronic): Secondary | ICD-10-CM

## 2015-05-31 DIAGNOSIS — D509 Iron deficiency anemia, unspecified: Secondary | ICD-10-CM

## 2015-05-31 LAB — CBC WITH DIFFERENTIAL/PLATELET
BASO%: 0.8 % (ref 0.0–2.0)
BASOS ABS: 0 10*3/uL (ref 0.0–0.1)
EOS ABS: 0.2 10*3/uL (ref 0.0–0.5)
EOS%: 3.7 % (ref 0.0–7.0)
HEMATOCRIT: 41.9 % (ref 34.8–46.6)
HGB: 13.2 g/dL (ref 11.6–15.9)
LYMPH#: 1.4 10*3/uL (ref 0.9–3.3)
LYMPH%: 29.9 % (ref 14.0–49.7)
MCH: 32 pg (ref 25.1–34.0)
MCHC: 31.5 g/dL (ref 31.5–36.0)
MCV: 101.5 fL — ABNORMAL HIGH (ref 79.5–101.0)
MONO#: 0.6 10*3/uL (ref 0.1–0.9)
MONO%: 13.1 % (ref 0.0–14.0)
NEUT%: 52.5 % (ref 38.4–76.8)
NEUTROS ABS: 2.5 10*3/uL (ref 1.5–6.5)
Platelets: 220 10*3/uL (ref 145–400)
RBC: 4.13 10*6/uL (ref 3.70–5.45)
RDW: 14.7 % — AB (ref 11.2–14.5)
WBC: 4.8 10*3/uL (ref 3.9–10.3)

## 2015-05-31 LAB — FERRITIN CHCC: FERRITIN: 49 ng/mL (ref 9–269)

## 2015-05-31 NOTE — Telephone Encounter (Signed)
Per Dr. Benay Spice; notified pt's son that ferritin and hb are normal.  "That's what we wanted to hear"  Pt's son verbalized understanding and expressed appreciation for call back.

## 2015-05-31 NOTE — Telephone Encounter (Signed)
"  My son says he received call about this mornings labs.  Could someone call me to notify me what ferritin level is so I know if I need fereheme."

## 2015-05-31 NOTE — Telephone Encounter (Signed)
-----   Message from Ladell Pier, MD sent at 05/31/2015 11:21 AM EDT ----- Please call patient, ferritin and hb are normal

## 2015-06-01 ENCOUNTER — Other Ambulatory Visit: Payer: Self-pay | Admitting: Nurse Practitioner

## 2015-06-03 NOTE — Telephone Encounter (Signed)
Last filled 05/04/15, last seen 04/05/15. [facility]

## 2015-06-03 NOTE — Telephone Encounter (Signed)
Refill called to [facility] 

## 2015-06-03 NOTE — Telephone Encounter (Signed)
Please call in alprazolam with 1 refills 

## 2015-06-04 NOTE — Telephone Encounter (Signed)
06/04/15 10:16am.  Patient left message that she would like the exact number of her ferritin.  She states if it is below 50 she needs iron.  If it is not below 50, then she wants her ferritin checked sooner than her next appt.as she is having sx of SOB, headaches and numbness and tingling in her fingers.   Please call her at [phone]

## 2015-06-05 ENCOUNTER — Telehealth: Payer: Self-pay | Admitting: *Deleted

## 2015-06-05 NOTE — Telephone Encounter (Signed)
Reviewed pt's call with Dr. Benay Spice: Order sent to scheduling for iron infusion appointment. Called pt, she understands to expect call from scheduling.

## 2015-06-06 ENCOUNTER — Telehealth: Payer: Self-pay | Admitting: Oncology

## 2015-06-06 NOTE — Telephone Encounter (Signed)
Spoke with patient and she is aware of her 11/14 appointment and i also sent an inbox to dr sherrill for orders per pof  anne

## 2015-06-17 ENCOUNTER — Other Ambulatory Visit: Payer: Self-pay | Admitting: Oncology

## 2015-06-17 ENCOUNTER — Telehealth: Payer: Self-pay | Admitting: Oncology

## 2015-06-17 ENCOUNTER — Ambulatory Visit (HOSPITAL_BASED_OUTPATIENT_CLINIC_OR_DEPARTMENT_OTHER): Payer: PPO

## 2015-06-17 VITALS — BP 118/61 | HR 72 | Temp 98.3°F | Resp 20

## 2015-06-17 DIAGNOSIS — D509 Iron deficiency anemia, unspecified: Secondary | ICD-10-CM

## 2015-06-17 MED ORDER — DIPHENHYDRAMINE HCL 25 MG PO TABS
50.0000 mg | ORAL_TABLET | Freq: Once | ORAL | Status: AC
  Administered 2015-06-17: 50 mg via ORAL
  Filled 2015-06-17: qty 2

## 2015-06-17 MED ORDER — SODIUM CHLORIDE 0.9 % IV SOLN
Freq: Once | INTRAVENOUS | Status: AC
  Administered 2015-06-17: 09:00:00 via INTRAVENOUS

## 2015-06-17 MED ORDER — ACETAMINOPHEN 325 MG PO TABS
650.0000 mg | ORAL_TABLET | Freq: Once | ORAL | Status: AC
  Administered 2015-06-17: 650 mg via ORAL

## 2015-06-17 MED ORDER — ALTEPLASE 2 MG IJ SOLR
2.0000 mg | Freq: Once | INTRAMUSCULAR | Status: DC | PRN
  Filled 2015-06-17: qty 2

## 2015-06-17 MED ORDER — HEPARIN SOD (PORK) LOCK FLUSH 100 UNIT/ML IV SOLN
250.0000 [IU] | Freq: Once | INTRAVENOUS | Status: DC | PRN
  Filled 2015-06-17: qty 5

## 2015-06-17 MED ORDER — SODIUM CHLORIDE 0.9 % IJ SOLN
10.0000 mL | INTRAMUSCULAR | Status: DC | PRN
  Filled 2015-06-17: qty 10

## 2015-06-17 MED ORDER — SODIUM CHLORIDE 0.9 % IV SOLN
1500.0000 mg | Freq: Once | INTRAVENOUS | Status: AC
  Administered 2015-06-17: 1500 mg via INTRAVENOUS
  Filled 2015-06-17: qty 30

## 2015-06-17 MED ORDER — ONDANSETRON HCL 8 MG PO TABS
8.0000 mg | ORAL_TABLET | Freq: Once | ORAL | Status: AC
  Administered 2015-06-17: 8 mg via ORAL

## 2015-06-17 MED ORDER — DIPHENHYDRAMINE HCL 25 MG PO CAPS
ORAL_CAPSULE | ORAL | Status: AC
  Filled 2015-06-17: qty 2

## 2015-06-17 MED ORDER — SODIUM CHLORIDE 0.9 % IJ SOLN
3.0000 mL | Freq: Once | INTRAMUSCULAR | Status: DC | PRN
  Filled 2015-06-17: qty 10

## 2015-06-17 MED ORDER — ACETAMINOPHEN 325 MG PO TABS
ORAL_TABLET | ORAL | Status: AC
Start: 2015-06-17 — End: 2015-06-17
  Filled 2015-06-17: qty 2

## 2015-06-17 MED ORDER — SODIUM CHLORIDE 0.9 % IV SOLN
50.0000 mg | Freq: Once | INTRAVENOUS | Status: AC
  Administered 2015-06-17: 50 mg via INTRAVENOUS
  Filled 2015-06-17: qty 1

## 2015-06-17 MED ORDER — ONDANSETRON HCL 8 MG PO TABS
ORAL_TABLET | ORAL | Status: AC
  Filled 2015-06-17: qty 1

## 2015-06-17 MED ORDER — HEPARIN SOD (PORK) LOCK FLUSH 100 UNIT/ML IV SOLN
500.0000 [IU] | Freq: Once | INTRAVENOUS | Status: DC | PRN
  Filled 2015-06-17: qty 5

## 2015-06-17 NOTE — Telephone Encounter (Signed)
Pt need to r/s lab...done printed pt new sched.Marland KitchenMarland Kitchen

## 2015-06-17 NOTE — Patient Instructions (Signed)

## 2015-07-12 ENCOUNTER — Telehealth: Payer: Self-pay | Admitting: Oncology

## 2015-07-12 NOTE — Telephone Encounter (Signed)
Returned her call to move her lab on 12/15 to later,done

## 2015-07-16 ENCOUNTER — Ambulatory Visit (INDEPENDENT_AMBULATORY_CARE_PROVIDER_SITE_OTHER): Payer: PPO | Admitting: Nurse Practitioner

## 2015-07-16 ENCOUNTER — Encounter: Payer: Self-pay | Admitting: Nurse Practitioner

## 2015-07-16 VITALS — BP 105/62 | HR 78 | Temp 96.9°F | Ht 64.0 in | Wt 98.8 lb

## 2015-07-16 DIAGNOSIS — K219 Gastro-esophageal reflux disease without esophagitis: Secondary | ICD-10-CM | POA: Diagnosis not present

## 2015-07-16 DIAGNOSIS — E038 Other specified hypothyroidism: Secondary | ICD-10-CM

## 2015-07-16 DIAGNOSIS — E034 Atrophy of thyroid (acquired): Secondary | ICD-10-CM

## 2015-07-16 DIAGNOSIS — F411 Generalized anxiety disorder: Secondary | ICD-10-CM | POA: Diagnosis not present

## 2015-07-16 MED ORDER — OMEPRAZOLE 40 MG PO CPDR: DELAYED_RELEASE_CAPSULE | ORAL | Status: DC

## 2015-07-16 MED ORDER — SERTRALINE HCL 100 MG PO TABS: 100.0000 mg | ORAL_TABLET | Freq: Every day | ORAL | Status: DC

## 2015-07-16 MED ORDER — ALPRAZOLAM 0.5 MG PO TABS: ORAL_TABLET | ORAL | Status: DC

## 2015-07-16 MED ORDER — LEVOTHYROXINE SODIUM 88 MCG PO TABS: 88.0000 ug | ORAL_TABLET | Freq: Every morning | ORAL | Status: DC

## 2015-07-16 NOTE — Progress Notes (Signed)
Subjective:    Patient ID: Brittany Hensley, female    DOB: Jan 13, 1951, 64 y.o.   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 Learta Codding- is to have Hgb checked next week  GERD Sees Dr. Watt Climes- 3 x a year- recently had ballon dilitation of stomach- narrowing from gastrectomy. Has follow up with him thursday of this week Hypothyroidism Doing well on current meds- no complaints  Rosacea Metrogel- sees Dr. Syble Creek- currently under control Kidney stones Patient hasn't been taking- Dr. Jeffie Pollock started her on this to prevent kidney stones but patient not taking GAD Patient currently taking xanax- takes care of her mother and takes 3 x a day- last rx not written correctly. Recently started on zoloft which is helping with her stress. Would like to increase zoloft. Malabsorption problems due to gastrectomy Takes a lot of supplements but not sure she is absorbing everything she needs. Her weight is fluctuating but can never seem to hold on to the pounds.  Review of Systems  Constitutional: Negative.   HENT: Negative.   Eyes: Negative.   Respiratory: Negative.   Cardiovascular: Negative.   Gastrointestinal: Negative.   Endocrine: Negative.   Genitourinary: Negative.   Musculoskeletal: Negative.   Allergic/Immunologic: Negative.   Neurological: Negative.   Hematological: Negative.   Psychiatric/Behavioral: Positive for agitation. Negative for suicidal ideas and sleep disturbance. The patient is nervous/anxious.   All other systems reviewed and are negative.      Objective:   Physical Exam  Constitutional: She is oriented to person, place, and time. She appears well-developed and well-nourished.  HENT:  Right Ear: Hearing, tympanic membrane, external ear and ear canal normal.  Left Ear:  Hearing, tympanic membrane, external ear and ear canal normal.  Nose: Mucosal edema and rhinorrhea present.  Mouth/Throat: Uvula is midline, oropharynx is clear and moist and mucous membranes are normal.  Eyes: EOM are normal.  Neck: Trachea normal, normal range of motion and full passive range of motion without pain. Neck supple. No JVD present. Carotid bruit is not present. No thyromegaly present.  Cardiovascular: Normal rate, regular rhythm, normal heart sounds and intact distal pulses.  Exam reveals no gallop and no friction rub.   No murmur heard. Pulmonary/Chest: Effort normal and breath sounds normal.  Abdominal: Soft. Bowel sounds are normal. She exhibits no distension and no mass. There is no tenderness.  Musculoskeletal: Normal range of motion.  Lymphadenopathy:    She has no cervical adenopathy.  Neurological: She is alert and oriented to person, place, and time. She has normal reflexes.  Skin: Skin is warm and dry. There is pallor.  Psychiatric: She has a normal mood and affect. Her behavior is normal. Judgment and thought content normal.   BP 105/62 mmHg  Pulse 78  Temp(Src) 96.9 F (36.1 C) (Oral)  Ht 5' 4"  (1.626 m)  Wt 98 lb 12.8 oz (44.815 kg)  BMI 16.95 kg/m2        Assessment & Plan:  1. GAD (generalized anxiety disorder) Stress management - sertraline (ZOLOFT) 100 MG tablet; Take 1 tablet (100 mg total) by mouth at bedtime.  Dispense: 30 tablet; Refill: 5 - ALPRAZolam (XANAX) 0.5 MG tablet; TAKE 1/2 TO 1 TABLET EVERY 8 TO 12 HOURSAS NEEDED  Dispense: 90 tablet; Refill: 1  2.  Gastroesophageal reflux disease without esophagitis Avoid spicy foods Do not eat 2 hours prior to bedtime - omeprazole (PRILOSEC) 40 MG capsule; TAKE ONE (1) CAPSULE EACH DAY  Dispense: 30 capsule; Refill: 5  3. Hypothyroidism due to acquired atrophy of thyroid - levothyroxine (SYNTHROID, LEVOTHROID) 88 MCG tablet; Take 1 tablet (88 mcg total) by mouth every morning.  Dispense: 30  tablet; Refill: 5 - CMP14+EGFR - Lipid panel - Thyroid Panel With TSH    Labs pending Health maintenance reviewed Diet and exercise encouraged Continue all meds Follow up  In 6 months   [place], FNP

## 2015-07-16 NOTE — Patient Instructions (Signed)
Health Maintenance, Female Adopting a healthy lifestyle and getting preventive care can go a long way to promote health and wellness. Talk with your health care provider about what schedule of regular examinations is right for you. This is a good chance for you to check in with your provider about disease prevention and staying healthy. In between checkups, there are plenty of things you can do on your own. Experts have done a lot of research about which lifestyle changes and preventive measures are most likely to keep you healthy. Ask your health care provider for more information. WEIGHT AND DIET  Eat a healthy diet  Be sure to include plenty of vegetables, fruits, low-fat dairy products, and lean protein.  Do not eat a lot of foods high in solid fats, added sugars, or salt.  Get regular exercise. This is one of the most important things you can do for your health.  Most adults should exercise for at least 150 minutes each week. The exercise should increase your heart rate and make you sweat (moderate-intensity exercise).  Most adults should also do strengthening exercises at least twice a week. This is in addition to the moderate-intensity exercise.  Maintain a healthy weight  Body mass index (BMI) is a measurement that can be used to identify possible weight problems. It estimates body fat based on height and weight. Your health care provider can help determine your BMI and help you achieve or maintain a healthy weight.  For females 20 years of age and older:   A BMI below 18.5 is considered underweight.  A BMI of 18.5 to 24.9 is normal.  A BMI of 25 to 29.9 is considered overweight.  A BMI of 30 and above is considered obese.  Watch levels of cholesterol and blood lipids  You should start having your blood tested for lipids and cholesterol at 65 years of age, then have this test every 5 years.  You may need to have your cholesterol levels checked more often if:  Your lipid  or cholesterol levels are high.  You are older than 65 years of age.  You are at high risk for heart disease.  CANCER SCREENING   Lung Cancer  Lung cancer screening is recommended for adults 55-80 years old who are at high risk for lung cancer because of a history of smoking.  A yearly low-dose CT scan of the lungs is recommended for people who:  Currently smoke.  Have quit within the past 15 years.  Have at least a 30-pack-year history of smoking. A pack year is smoking an average of one pack of cigarettes a day for 1 year.  Yearly screening should continue until it has been 15 years since you quit.  Yearly screening should stop if you develop a health problem that would prevent you from having lung cancer treatment.  Breast Cancer  Practice breast self-awareness. This means understanding how your breasts normally appear and feel.  It also means doing regular breast self-exams. Let your health care provider know about any changes, no matter how small.  If you are in your 20s or 30s, you should have a clinical breast exam (CBE) by a health care provider every 1-3 years as part of a regular health exam.  If you are 40 or older, have a CBE every year. Also consider having a breast X-ray (mammogram) every year.  If you have a family history of breast cancer, talk to your health care provider about genetic screening.  If you   are at high risk for breast cancer, talk to your health care provider about having an MRI and a mammogram every year.  Breast cancer gene (BRCA) assessment is recommended for women who have family members with BRCA-related cancers. BRCA-related cancers include:  Breast.  Ovarian.  Tubal.  Peritoneal cancers.  Results of the assessment will determine the need for genetic counseling and BRCA1 and BRCA2 testing. Cervical Cancer Your health care provider may recommend that you be screened regularly for cancer of the pelvic organs (ovaries, uterus, and  vagina). This screening involves a pelvic examination, including checking for microscopic changes to the surface of your cervix (Pap test). You may be encouraged to have this screening done every 3 years, beginning at age 21.  For women ages 30-65, health care providers may recommend pelvic exams and Pap testing every 3 years, or they may recommend the Pap and pelvic exam, combined with testing for human papilloma virus (HPV), every 5 years. Some types of HPV increase your risk of cervical cancer. Testing for HPV may also be done on women of any age with unclear Pap test results.  Other health care providers may not recommend any screening for nonpregnant women who are considered low risk for pelvic cancer and who do not have symptoms. Ask your health care provider if a screening pelvic exam is right for you.  If you have had past treatment for cervical cancer or a condition that could lead to cancer, you need Pap tests and screening for cancer for at least 20 years after your treatment. If Pap tests have been discontinued, your risk factors (such as having a new sexual partner) need to be reassessed to determine if screening should resume. Some women have medical problems that increase the chance of getting cervical cancer. In these cases, your health care provider may recommend more frequent screening and Pap tests. Colorectal Cancer  This type of cancer can be detected and often prevented.  Routine colorectal cancer screening usually begins at 65 years of age and continues through 65 years of age.  Your health care provider may recommend screening at an earlier age if you have risk factors for colon cancer.  Your health care provider may also recommend using home test kits to check for hidden blood in the stool.  A small camera at the end of a tube can be used to examine your colon directly (sigmoidoscopy or colonoscopy). This is done to check for the earliest forms of colorectal  cancer.  Routine screening usually begins at age 50.  Direct examination of the colon should be repeated every 5-10 years through 65 years of age. However, you may need to be screened more often if early forms of precancerous polyps or small growths are found. Skin Cancer  Check your skin from head to toe regularly.  Tell your health care provider about any new moles or changes in moles, especially if there is a change in a mole's shape or color.  Also tell your health care provider if you have a mole that is larger than the size of a pencil eraser.  Always use sunscreen. Apply sunscreen liberally and repeatedly throughout the day.  Protect yourself by wearing long sleeves, pants, a wide-brimmed hat, and sunglasses whenever you are outside. HEART DISEASE, DIABETES, AND HIGH BLOOD PRESSURE   High blood pressure causes heart disease and increases the risk of stroke. High blood pressure is more likely to develop in:  People who have blood pressure in the high end   of the normal range (130-139/85-89 mm Hg).  People who are overweight or obese.  People who are African American.  If you are 38-23 years of age, have your blood pressure checked every 3-5 years. If you are 61 years of age or older, have your blood pressure checked every year. You should have your blood pressure measured twice--once when you are at a hospital or clinic, and once when you are not at a hospital or clinic. Record the average of the two measurements. To check your blood pressure when you are not at a hospital or clinic, you can use:  An automated blood pressure machine at a pharmacy.  A home blood pressure monitor.  If you are between 45 years and 39 years old, ask your health care provider if you should take aspirin to prevent strokes.  Have regular diabetes screenings. This involves taking a blood sample to check your fasting blood sugar level.  If you are at a normal weight and have a low risk for diabetes,  have this test once every three years after 65 years of age.  If you are overweight and have a high risk for diabetes, consider being tested at a younger age or more often. PREVENTING INFECTION  Hepatitis B  If you have a higher risk for hepatitis B, you should be screened for this virus. You are considered at high risk for hepatitis B if:  You were born in a country where hepatitis B is common. Ask your health care provider which countries are considered high risk.  Your parents were born in a high-risk country, and you have not been immunized against hepatitis B (hepatitis B vaccine).  You have HIV or AIDS.  You use needles to inject street drugs.  You live with someone who has hepatitis B.  You have had sex with someone who has hepatitis B.  You get hemodialysis treatment.  You take certain medicines for conditions, including cancer, organ transplantation, and autoimmune conditions. Hepatitis C  Blood testing is recommended for:  Everyone born from [birth year] through 1966.  Anyone with known risk factors for hepatitis C. Sexually transmitted infections (STIs)  You should be screened for sexually transmitted infections (STIs) including gonorrhea and chlamydia if:  You are sexually active and are younger than 65 years of age.  You are older than 65 years of age and your health care provider tells you that you are at risk for this type of infection.  Your sexual activity has changed since you were last screened and you are at an increased risk for chlamydia or gonorrhea. Ask your health care provider if you are at risk.  If you do not have HIV, but are at risk, it may be recommended that you take a prescription medicine daily to prevent HIV infection. This is called pre-exposure prophylaxis (PrEP). You are considered at risk if:  You are sexually active and do not regularly use condoms or know the HIV status of your partner(s).  You take drugs by injection.  You are sexually  active with a partner who has HIV. Talk with your health care provider about whether you are at high risk of being infected with HIV. If you choose to begin PrEP, you should first be tested for HIV. You should then be tested every 3 months for as long as you are taking PrEP.  PREGNANCY   If you are premenopausal and you may become pregnant, ask your health care provider about preconception counseling.  If you may  become pregnant, take 400 to 800 micrograms (mcg) of folic acid every day.  If you want to prevent pregnancy, talk to your health care provider about birth control (contraception). OSTEOPOROSIS AND MENOPAUSE   Osteoporosis is a disease in which the bones lose minerals and strength with aging. This can result in serious bone fractures. Your risk for osteoporosis can be identified using a bone density scan.  If you are 61 years of age or older, or if you are at risk for osteoporosis and fractures, ask your health care provider if you should be screened.  Ask your health care provider whether you should take a calcium or vitamin D supplement to lower your risk for osteoporosis.  Menopause may have certain physical symptoms and risks.  Hormone replacement therapy may reduce some of these symptoms and risks. Talk to your health care provider about whether hormone replacement therapy is right for you.  HOME CARE INSTRUCTIONS   Schedule regular health, dental, and eye exams.  Stay current with your immunizations.   Do not use any tobacco products including cigarettes, chewing tobacco, or electronic cigarettes.  If you are pregnant, do not drink alcohol.  If you are breastfeeding, limit how much and how often you drink alcohol.  Limit alcohol intake to no more than 1 drink per day for nonpregnant women. One drink equals 12 ounces of beer, 5 ounces of wine, or 1 ounces of hard liquor.  Do not use street drugs.  Do not share needles.  Ask your health care provider for help if  you need support or information about quitting drugs.  Tell your health care provider if you often feel depressed.  Tell your health care provider if you have ever been abused or do not feel safe at home.   This information is not intended to replace advice given to you by your health care provider. Make sure you discuss any questions you have with your health care provider.   Document Released: 02/02/2011 Document Revised: 08/10/2014 Document Reviewed: 06/21/2013 Elsevier Interactive Patient Education [employer].

## 2015-07-16 NOTE — Addendum Note (Signed)
Addended by: Chevis Pretty on: 07/16/2015 10:21 AM   Modules accepted: Orders

## 2015-07-18 ENCOUNTER — Other Ambulatory Visit: Payer: PPO

## 2015-07-18 ENCOUNTER — Other Ambulatory Visit (HOSPITAL_BASED_OUTPATIENT_CLINIC_OR_DEPARTMENT_OTHER): Payer: PPO

## 2015-07-18 DIAGNOSIS — D509 Iron deficiency anemia, unspecified: Secondary | ICD-10-CM

## 2015-07-18 DIAGNOSIS — D500 Iron deficiency anemia secondary to blood loss (chronic): Secondary | ICD-10-CM | POA: Diagnosis not present

## 2015-07-18 LAB — CBC WITH DIFFERENTIAL/PLATELET
BASO%: 0.8 % (ref 0.0–2.0)
Basophils Absolute: 0 10*3/uL (ref 0.0–0.1)
EOS%: 2.3 % (ref 0.0–7.0)
Eosinophils Absolute: 0.1 10*3/uL (ref 0.0–0.5)
HCT: 38 % (ref 34.8–46.6)
HGB: 12.3 g/dL (ref 11.6–15.9)
LYMPH#: 1.3 10*3/uL (ref 0.9–3.3)
LYMPH%: 24.9 % (ref 14.0–49.7)
MCH: 32.1 pg (ref 25.1–34.0)
MCHC: 32.4 g/dL (ref 31.5–36.0)
MCV: 99.2 fL (ref 79.5–101.0)
MONO#: 0.5 10*3/uL (ref 0.1–0.9)
MONO%: 10 % (ref 0.0–14.0)
NEUT%: 62 % (ref 38.4–76.8)
NEUTROS ABS: 3.2 10*3/uL (ref 1.5–6.5)
Platelets: 213 10*3/uL (ref 145–400)
RBC: 3.83 10*6/uL (ref 3.70–5.45)
RDW: 16.3 % — ABNORMAL HIGH (ref 11.2–14.5)
WBC: 5.1 10*3/uL (ref 3.9–10.3)

## 2015-07-18 LAB — FERRITIN: Ferritin: 250 ng/ml (ref 9–269)

## 2015-07-19 ENCOUNTER — Telehealth: Payer: Self-pay | Admitting: *Deleted

## 2015-07-19 NOTE — Telephone Encounter (Signed)
-----   Message from Ladell Pier, MD sent at 07/18/2015  7:34 PM EST ----- Please call patient, ferritin and hb are normal, f/u as scheduled

## 2015-07-22 ENCOUNTER — Telehealth: Payer: Self-pay | Admitting: *Deleted

## 2015-07-22 NOTE — Telephone Encounter (Signed)
-----   Message from Ladell Pier, MD sent at 07/18/2015  7:34 PM EST ----- Please call patient, ferritin and hb are normal, f/u as scheduled

## 2015-07-22 NOTE — Telephone Encounter (Signed)
Per Dr. Benay Spice; notified pt that ferritin and hb are normal, f/u as scheduled.  Pt verbalized understanding and expressed appreciation for call.

## 2015-07-25 ENCOUNTER — Other Ambulatory Visit: Payer: PPO

## 2015-08-06 ENCOUNTER — Encounter: Payer: Self-pay | Admitting: *Deleted

## 2015-09-13 ENCOUNTER — Telehealth: Payer: Self-pay | Admitting: Nurse Practitioner

## 2015-09-13 NOTE — Telephone Encounter (Signed)
DENIED 

## 2015-09-30 ENCOUNTER — Other Ambulatory Visit: Payer: Self-pay | Admitting: Nurse Practitioner

## 2015-10-01 NOTE — Telephone Encounter (Signed)
Please call in xanax with 1 refills 

## 2015-10-01 NOTE — Telephone Encounter (Signed)
Last seen 07/16/15  MMM If approved route to nurse to call into  [facility]

## 2015-10-01 NOTE — Telephone Encounter (Signed)
Left detailed message stating requested rx's were sent to pharmacy and to [facility] with any further questions or concerns.

## 2015-10-24 DIAGNOSIS — K913 Postprocedural intestinal obstruction: Secondary | ICD-10-CM | POA: Diagnosis not present

## 2015-10-24 DIAGNOSIS — R112 Nausea with vomiting, unspecified: Secondary | ICD-10-CM | POA: Diagnosis not present

## 2015-11-04 ENCOUNTER — Encounter (HOSPITAL_COMMUNITY): Payer: Self-pay | Admitting: *Deleted

## 2015-11-11 ENCOUNTER — Other Ambulatory Visit: Payer: Self-pay | Admitting: Gastroenterology

## 2015-11-12 ENCOUNTER — Encounter (HOSPITAL_COMMUNITY): Payer: Self-pay

## 2015-11-12 ENCOUNTER — Ambulatory Visit (HOSPITAL_COMMUNITY): Payer: PPO | Admitting: Anesthesiology

## 2015-11-12 ENCOUNTER — Ambulatory Visit (HOSPITAL_COMMUNITY)
Admission: RE | Admit: 2015-11-12 | Discharge: 2015-11-12 | Disposition: A | Discharge status: Home / Self Care | Payer: PPO | Source: Ambulatory Visit | Attending: Gastroenterology | Admitting: Gastroenterology

## 2015-11-12 ENCOUNTER — Encounter (HOSPITAL_COMMUNITY)
Admission: RE | Disposition: A | Discharge status: Home / Self Care | Payer: Self-pay | Source: Ambulatory Visit | Attending: Gastroenterology

## 2015-11-12 DIAGNOSIS — Z79899 Other long term (current) drug therapy: Secondary | ICD-10-CM | POA: Diagnosis not present

## 2015-11-12 DIAGNOSIS — K219 Gastro-esophageal reflux disease without esophagitis: Secondary | ICD-10-CM | POA: Insufficient documentation

## 2015-11-12 DIAGNOSIS — X58XXXA Exposure to other specified factors, initial encounter: Secondary | ICD-10-CM | POA: Diagnosis not present

## 2015-11-12 DIAGNOSIS — K311 Adult hypertrophic pyloric stenosis: Secondary | ICD-10-CM | POA: Diagnosis not present

## 2015-11-12 DIAGNOSIS — K9189 Other postprocedural complications and disorders of digestive system: Secondary | ICD-10-CM | POA: Diagnosis not present

## 2015-11-12 DIAGNOSIS — Z980 Intestinal bypass and anastomosis status: Secondary | ICD-10-CM | POA: Insufficient documentation

## 2015-11-12 DIAGNOSIS — Y832 Surgical operation with anastomosis, bypass or graft as the cause of abnormal reaction of the patient, or of later complication, without mention of misadventure at the time of the procedure: Secondary | ICD-10-CM | POA: Diagnosis not present

## 2015-11-12 DIAGNOSIS — K449 Diaphragmatic hernia without obstruction or gangrene: Secondary | ICD-10-CM | POA: Insufficient documentation

## 2015-11-12 DIAGNOSIS — T182XXA Foreign body in stomach, initial encounter: Secondary | ICD-10-CM | POA: Insufficient documentation

## 2015-11-12 DIAGNOSIS — E039 Hypothyroidism, unspecified: Secondary | ICD-10-CM | POA: Insufficient documentation

## 2015-11-12 DIAGNOSIS — F419 Anxiety disorder, unspecified: Secondary | ICD-10-CM | POA: Diagnosis not present

## 2015-11-12 DIAGNOSIS — D509 Iron deficiency anemia, unspecified: Secondary | ICD-10-CM | POA: Diagnosis not present

## 2015-11-12 HISTORY — PX: BALLOON DILATION: SHX5330

## 2015-11-12 HISTORY — PX: ESOPHAGOGASTRODUODENOSCOPY (EGD) WITH PROPOFOL: SHX5813

## 2015-11-12 SURGERY — ESOPHAGOGASTRODUODENOSCOPY (EGD) WITH PROPOFOL
Anesthesia: Monitor Anesthesia Care

## 2015-11-12 MED ORDER — PROPOFOL 500 MG/50ML IV EMUL
INTRAVENOUS | Status: DC | PRN
  Administered 2015-11-12: 100 ug/kg/min via INTRAVENOUS

## 2015-11-12 MED ORDER — PHENYLEPHRINE HCL 10 MG/ML IJ SOLN
INTRAMUSCULAR | Status: DC | PRN
  Administered 2015-11-12: 80 ug via INTRAVENOUS

## 2015-11-12 MED ORDER — LACTATED RINGERS IV SOLN
INTRAVENOUS | Status: DC | PRN
  Administered 2015-11-12: 13:00:00 via INTRAVENOUS

## 2015-11-12 MED ORDER — PROPOFOL 500 MG/50ML IV EMUL
INTRAVENOUS | Status: DC | PRN
  Administered 2015-11-12 (×2): 30 mg via INTRAVENOUS

## 2015-11-12 MED ORDER — ONDANSETRON HCL 4 MG/2ML IJ SOLN
INTRAMUSCULAR | Status: DC | PRN
  Administered 2015-11-12: 4 mg via INTRAVENOUS

## 2015-11-12 MED ORDER — PROPOFOL 10 MG/ML IV BOLUS
INTRAVENOUS | Status: AC
  Filled 2015-11-12: qty 40

## 2015-11-12 MED ORDER — GLYCOPYRROLATE 0.2 MG/ML IJ SOLN
INTRAMUSCULAR | Status: DC | PRN
  Administered 2015-11-12: 0.2 mg via INTRAVENOUS

## 2015-11-12 MED ORDER — PHENYLEPHRINE 40 MCG/ML (10ML) SYRINGE FOR IV PUSH (FOR BLOOD PRESSURE SUPPORT)
PREFILLED_SYRINGE | INTRAVENOUS | Status: AC
  Filled 2015-11-12: qty 10

## 2015-11-12 SURGICAL SUPPLY — 15 items

## 2015-11-12 NOTE — Progress Notes (Signed)
Brittany Hensley Yust 12:09 PM  Subjective: Patient without any new complaints since we last saw her in fact feels better after 2 days of clear liquids  Objective: Vital signs stable afebrile exam please see preassessment evaluation  Assessment: Recurrent anastomotic stricture  Plan: Okay to proceed with endoscopy probable balloon dilation with anesthesia assistance  [facility]  Pager [phone] After 5PM or if no answer call [phone]

## 2015-11-12 NOTE — Transfer of Care (Signed)
Immediate Anesthesia Transfer of Care Note  Patient: Brittany Hensley  Procedure(s) Performed: Procedure(s): ESOPHAGOGASTRODUODENOSCOPY (EGD) WITH PROPOFOL (N/A) BALLOON DILATION (N/A)  Patient Location: PACU  Anesthesia Type:MAC  Level of Consciousness: awake, alert  and oriented  Airway & Oxygen Therapy: Patient Spontanous Breathing and Patient connected to nasal cannula oxygen  Post-op Assessment: Report given to RN and Post -op Vital signs reviewed and stable  Post vital signs: Reviewed and stable  Last Vitals:  Filed Vitals:   11/12/15 1154  BP: 100/54  Pulse: 61  Temp: 36.7 C  Resp: 12    Complications: No apparent anesthesia complications

## 2015-11-12 NOTE — Discharge Instructions (Signed)
Full liquids only today and slowly advance as tolerated and call me as needed otherwise follow-up in 2 months  Esophagogastroduodenoscopy, Care After Refer to this sheet in the next few weeks. These instructions provide you with information about caring for yourself after your procedure. Your health care provider may also give you more specific instructions. Your treatment has been planned according to current medical practices, but problems sometimes occur. Call your health care provider if you have any problems or questions after your procedure. WHAT TO EXPECT AFTER THE PROCEDURE After your procedure, it is typical to feel:  Soreness in your throat.  Pain with swallowing.  Sick to your stomach (nauseous).  Bloated.  Dizzy.  Fatigued. HOME CARE INSTRUCTIONS  Do not eat or drink anything until the numbing medicine (local anesthetic) has worn off and your gag reflex has returned. You will know that the local anesthetic has worn off when you can swallow comfortably.  Do not drive or operate machinery until directed by your health care provider.  Take medicines only as directed by your health care provider. SEEK MEDICAL CARE IF:   You cannot stop coughing.  You are not urinating at all or less than usual. SEEK IMMEDIATE MEDICAL CARE IF:  You have difficulty swallowing.  You cannot eat or drink.  You have worsening throat or chest pain.  You have dizziness or lightheadedness or you faint.  You have nausea or vomiting.  You have chills.  You have a fever.  You have severe abdominal pain.  You have black, tarry, or bloody stools.   This information is not intended to replace advice given to you by your health care provider. Make sure you discuss any questions you have with your health care provider.   Document Released: 07/06/2012 Document Revised: 08/10/2014 Document Reviewed: 07/06/2012 Elsevier Interactive Patient Education 2016 [place] Monitored anesthesia care is an anesthesia service for a medical procedure. Anesthesia is the loss of the ability to feel pain. It is produced by medicines called anesthetics. It may affect a small area of your body (local anesthesia), a large area of your body (regional anesthesia), or your entire body (general anesthesia). The need for monitored anesthesia care depends your procedure, your condition, and the potential need for regional or general anesthesia. It is often provided during procedures where:   General anesthesia may be needed if there are complications. This is because you need special care when you are under general anesthesia.   You will be under local or regional anesthesia. This is so that you are able to have higher levels of anesthesia if needed.   You will receive calming medicines (sedatives). This is especially the case if sedatives are given to put you in a semi-conscious state of relaxation (deep sedation). This is because the amount of sedative needed to produce this state can be hard to predict. Too much of a sedative can produce general anesthesia. Monitored anesthesia care is performed by one or more health care providers who have special training in all types of anesthesia. You will need to meet with these health care providers before your procedure. During this meeting, they will ask you about your medical history. They will also give you instructions to follow. (For example, you will need to stop eating and drinking before your procedure. You may also need to stop or change medicines you are taking.) During your procedure, your health care providers will stay with you. They will:  Watch your condition. This includes watching your blood pressure, breathing, and level of pain.   Diagnose and treat problems that occur.   Give medicines if they are needed. These may include calming medicines (sedatives) and anesthetics.   Make sure you are  comfortable.  Having monitored anesthesia care does not necessarily mean that you will be under anesthesia. It does mean that your health care providers will be able to manage anesthesia if you need it or if it occurs. It also means that you will be able to have a different type of anesthesia than you are having if you need it. When your procedure is complete, your health care providers will continue to watch your condition. They will make sure any medicines wear off before you are allowed to go home.    This information is not intended to replace advice given to you by your health care provider. Make sure you discuss any questions you have with your health care provider.   Document Released: 04/15/2005 Document Revised: 08/10/2014 Document Reviewed: 08/31/2012 Elsevier Interactive Patient Education [employer].

## 2015-11-12 NOTE — Anesthesia Preprocedure Evaluation (Signed)
Anesthesia Evaluation  Patient identified by MRN, date of birth, ID band Patient awake    Reviewed: Allergy & Precautions, NPO status , Patient's Chart, lab work & pertinent test results  History of Anesthesia Complications Negative for: history of anesthetic complications  Airway Mallampati: II  TM Distance: >3 FB Neck ROM: Full    Dental  (+) Caps, Dental Advisory Given   Pulmonary neg pulmonary ROS,  breath sounds clear to auscultation        Cardiovascular - anginanegative cardio ROS  + dysrhythmias (remote h/o re-entry SVT, off of meds now) Rhythm:Regular Rate:Normal  '11 ECHO: EF 55-60%, valves OK   Neuro/Psych negative neurological ROS     GI/Hepatic Neg liver ROS, GERD-  Medicated and Controlled,  Endo/Other  Hypothyroidism   Renal/GU H/o stones     Musculoskeletal   Abdominal   Peds  Hematology  (+) Blood dyscrasia (h/o anemia), ,   Anesthesia Other Findings   Reproductive/Obstetrics                           Anesthesia Physical Anesthesia Plan  ASA: II  Anesthesia Plan: MAC   Post-op Pain Management:    Induction: Intravenous  Airway Management Planned: Natural Airway and Simple Face Mask  Additional Equipment:   Intra-op Plan:   Post-operative Plan:   Informed Consent: I have reviewed the patients History and Physical, chart, labs and discussed the procedure including the risks, benefits and alternatives for the proposed anesthesia with the patient or authorized representative who has indicated his/her understanding and acceptance.   Dental advisory given  Plan Discussed with: CRNA and Surgeon  Anesthesia Plan Comments: (Plan routine monitors, MAC)        Anesthesia Quick Evaluation  

## 2015-11-12 NOTE — Op Note (Signed)
[facility] Patient Name: Brittany Hensley Procedure Date: 11/12/2015 MRN: [id number] Attending MD: Clarene Essex , MD Date of Birth: 02-Mar-1951 CSN:  Age: 65 Admit Type: Inpatient Procedure:                Upper GI endoscopy Indications:              For therapy of post-surgical anastomotic stenosis,                            Management of operative complication: Dilation of                            anastomotic stricture Providers:                Clarene Essex, MD, Malka So, RN, Cletis Athens,                            Technician Referring MD:              Medicines:                Propofol total dose [id number] mg IV Complications:            No immediate complications., Vasovagal reaction Estimated Blood Loss:     Estimated blood loss was minimal. Procedure:                Pre-Anesthesia Assessment:                           - Prior to the procedure, a History and Physical                            was performed, and patient medications and                            allergies were reviewed. The patient's tolerance of                            previous anesthesia was also reviewed. The risks                            and benefits of the procedure and the sedation                            options and risks were discussed with the patient.                            All questions were answered, and informed consent                            was obtained. Prior Anticoagulants: The patient has                            taken no previous anticoagulant or antiplatelet  agents. ASA Grade Assessment: II - A patient with                            mild systemic disease. After reviewing the risks                            and benefits, the patient was deemed in                            satisfactory condition to undergo the procedure.                           After obtaining informed consent, the endoscope was                            passed  under direct vision. Throughout the                            procedure, the patient's blood pressure, pulse, and                            oxygen saturations were monitored continuously. The                            Endoscope was introduced through the mouth, and                            advanced to the proximal jejunum. The upper GI                            endoscopy was somewhat difficult due to presence of                            food. Successful completion of the procedure was                            aided by changing the patient to a supine position.                            The patient tolerated the procedure well. Scope In: Scope Out: Findings:      A small hiatal hernia was present.      A medium amount of food (residue) was found in the gastric body. Removal       of food was Attempted using the Jabier Mutton net but could not get in a       significant amount in the net so we rolled her on her backand were able       to find the lumen and proceeded with dilation as below      Evidence of a Roux-en-Y gastrojejunostomy was found. The gastrojejunal       anastomosis was characterized by erythema, friable mucosa, moderate       stenosis and an intact staple line. This was traversed after dilation.       The pouch-to-jejunum limb was characterized by erythema and  friable       mucosa. The jejunojejunal anastomosis was characterized by healthy       appearing mucosa. A TTS dilator was passed through the scope. Dilation       with a 12-13.5-15 mm balloon dilator was performed to 18 mm. A TTS       dilator was passed through the scope. Dilation with a 15-16.5-18 mm       balloon dilator was performed to 18 mm.      The exam was otherwise without abnormality. Impression:               - Small hiatal hernia.                           - A medium amount of food (residue) in the stomach.                            Removal was successful.                           - Roux-en-Y  gastrojejunostomy with gastrojejunal                            anastomosis characterized by erythema, friable                            mucosa, moderate stenosis and an intact staple                            line. Dilated.                           - The examination was otherwise normal. Moderate Sedation:      N/A- Per Anesthesia Care Recommendation:           - Patient has a contact number available for                            emergencies. The signs and symptoms of potential                            delayed complications were discussed with the                            patient. Return to normal activities tomorrow.                            Written discharge instructions were provided to the                            patient.                           - Full liquid diet for 1 day.                           - Continue present medications.                           -  Return to GI clinic in 2 months.                           - Telephone GI clinic if symptomatic PRN. Procedure Code(s):        --- Professional ---                           [phone], Esophagogastroduodenoscopy, flexible,                            transoral; with removal of foreign body(s)                           43245, Esophagogastroduodenoscopy, flexible,                            transoral; with dilation of gastric/duodenal                            stricture(s) (eg, balloon, bougie) Diagnosis Code(s):        --- Professional ---                           K44.9, Diaphragmatic hernia without obstruction or                            gangrene                           T18.2XXA, Foreign body in stomach, initial encounter                           Z98.0, Intestinal bypass and anastomosis status                           K91.89, Other postprocedural complications and                            disorders of digestive system CPT copyright 2016 American Medical Association. All rights reserved. The codes  documented in this report are preliminary and upon coder review may  be revised to meet current compliance requirements. Clarene Essex, MD Clarene Essex, MD 11/12/2015 1:59:29 PM This report has been signed electronically. Number of Addenda: 0

## 2015-11-14 ENCOUNTER — Encounter (HOSPITAL_COMMUNITY): Payer: Self-pay | Admitting: Gastroenterology

## 2015-11-15 NOTE — Anesthesia Postprocedure Evaluation (Signed)
Anesthesia Post Note  Patient: [name]  Procedure(s) Performed: Procedure(s) (LRB): ESOPHAGOGASTRODUODENOSCOPY (EGD) WITH PROPOFOL (N/A) BALLOON DILATION (N/A)  Patient location during evaluation: PACU Anesthesia Type: MAC Level of consciousness: awake and alert Pain management: pain level controlled Vital Signs Assessment: post-procedure vital signs reviewed and stable Respiratory status: spontaneous breathing, nonlabored ventilation, respiratory function stable and patient connected to nasal cannula oxygen Cardiovascular status: stable and blood pressure returned to baseline Anesthetic complications: no    Last Vitals:  Filed Vitals:   11/12/15 1419 11/12/15 1420  BP:  103/49  Pulse:  76  Temp: 36.4 C   Resp:  13    Last Pain: There were no vitals filed for this visit.               [name]

## 2015-11-27 ENCOUNTER — Other Ambulatory Visit: Payer: Self-pay | Admitting: Nurse Practitioner

## 2015-11-28 ENCOUNTER — Ambulatory Visit (HOSPITAL_BASED_OUTPATIENT_CLINIC_OR_DEPARTMENT_OTHER): Payer: PPO | Admitting: Oncology

## 2015-11-28 ENCOUNTER — Other Ambulatory Visit (HOSPITAL_BASED_OUTPATIENT_CLINIC_OR_DEPARTMENT_OTHER): Payer: PPO

## 2015-11-28 ENCOUNTER — Telehealth: Payer: Self-pay | Admitting: Oncology

## 2015-11-28 VITALS — BP 118/71 | HR 72 | Temp 98.1°F | Resp 18 | Ht 64.0 in | Wt 91.1 lb

## 2015-11-28 DIAGNOSIS — D509 Iron deficiency anemia, unspecified: Secondary | ICD-10-CM

## 2015-11-28 LAB — CBC WITH DIFFERENTIAL/PLATELET
BASO%: 0.4 % (ref 0.0–2.0)
BASOS ABS: 0 10*3/uL (ref 0.0–0.1)
EOS%: 1.4 % (ref 0.0–7.0)
Eosinophils Absolute: 0.1 10*3/uL (ref 0.0–0.5)
HEMATOCRIT: 34.1 % — AB (ref 34.8–46.6)
HEMOGLOBIN: 11.1 g/dL — AB (ref 11.6–15.9)
LYMPH#: 1.5 10*3/uL (ref 0.9–3.3)
LYMPH%: 27.2 % (ref 14.0–49.7)
MCH: 31.8 pg (ref 25.1–34.0)
MCHC: 32.6 g/dL (ref 31.5–36.0)
MCV: 97.7 fL (ref 79.5–101.0)
MONO#: 0.6 10*3/uL (ref 0.1–0.9)
MONO%: 10.1 % (ref 0.0–14.0)
NEUT#: 3.5 10*3/uL (ref 1.5–6.5)
NEUT%: 60.9 % (ref 38.4–76.8)
PLATELETS: 189 10*3/uL (ref 145–400)
RBC: 3.49 10*6/uL — ABNORMAL LOW (ref 3.70–5.45)
RDW: 17.2 % — AB (ref 11.2–14.5)
WBC: 5.7 10*3/uL (ref 3.9–10.3)

## 2015-11-28 LAB — FERRITIN: Ferritin: 122 ng/ml (ref 9–269)

## 2015-11-28 NOTE — Telephone Encounter (Signed)
per pof to sch pt appt-gasve pt copy of avs °

## 2015-11-28 NOTE — Telephone Encounter (Signed)
Script for xanax called to [facility]. 

## 2015-11-28 NOTE — Progress Notes (Signed)
  [place] OFFICE PROGRESS NOTE   Diagnosis: Iron deficiency anemia  INTERVAL HISTORY:   Ms. Wurster returns as scheduled. She last received IV iron in November 2016. No symptom of allergic reaction. She reports diffuse "aching "for several weeks after receiving iron. She underwent an upper endoscopy by Dr.Magod on 11/12/2015. The gastrojejunal anastomosis was dilated. She reports improvement in early satiety following this procedure.   She now has a good appetite. No bleeding.  Objective:  Vital signs in last 24 hours:  Blood pressure 118/71, pulse 72, temperature 98.1 F (36.7 C), temperature source Oral, resp. rate 18, height 5\' 4"  (1.626 m), weight 91 lb 1.6 oz (41.323 kg), SpO2 100 %.    HEENT: Neck without mass  Resp: Lungs clear bilaterally  Cardio: Regular rate and rhythm, systolic click  GI: No hepatomegaly, no mass, nontender  Vascular: No leg edema    Lab Results:  Lab Results  Component Value Date   WBC 5.7 11/28/2015   HGB 11.1* 11/28/2015   HCT 34.1* 11/28/2015   MCV 97.7 11/28/2015   PLT 189 11/28/2015   NEUTROABS 3.5 11/28/2015   Ferritin level pending    Medications: I have reviewed the patient's current medications.  Assessment/Plan: 1. Chronic anemia secondary to gastrointestinal blood loss and iron deficiency, she last received IV iron 06/17/2015.  2. History of kidney stones, followed by Dr. Roni Bread. 3. Gram-negative sepsis and septic shock syndrome April 2008. 4. History of acute renal failure secondary to obstruction and septic shock. 5. History of adrenal insufficiency, no longer on hormone replacement.  6. Questionable allergic to Venofer in June 2010. She reported an elevated blood pressure and "dizziness" on the day following the Venofer therapy. She has tolerated iron dextran without an apparent reaction. 7. History of a left forearm fracture.  8. Intermittent low-serum bicarbonate level, ? related to diarrhea or renal  insufficiency 9. Dysphagia, improved with erythromycin and esophageal dilatation procedures , status post dilatation of the gastric jejunal anastomosis 08/10/2014,02/15/2015, and 11/12/2015  Disposition:  Ms. Drapkin appears well. The hemoglobin is lower today. We will follow-up on the iron level. She would like to receive IV for a ferritin level of 50 or less.   She will return for a lab visit in 2 months and 4 months. She will be scheduled for an 8 month office visit. She continues follow-up with Dr.  Watt Climes for management of the gastrojejunal stricture.  Betsy Coder, MD  11/28/2015  12:40 PM

## 2015-11-28 NOTE — Telephone Encounter (Signed)
Please call in xanax with 1 refills 

## 2015-11-28 NOTE — Telephone Encounter (Signed)
Last seen 07/16/15  MMM If approved route to nurse to call into [facility]

## 2015-11-29 ENCOUNTER — Telehealth: Payer: Self-pay | Admitting: Medical Oncology

## 2015-11-29 NOTE — Telephone Encounter (Signed)
-----   Message from Ladell Pier, MD sent at 11/28/2015  6:02 PM EDT ----- Please call patient, ferritin is ok, f/u as scheduled

## 2015-11-29 NOTE — Telephone Encounter (Signed)
Pt.notified

## 2015-12-03 ENCOUNTER — Telehealth: Payer: Self-pay

## 2015-12-03 NOTE — Telephone Encounter (Signed)
Pt saw Dr Benay Spice 4/27. She sees her PCP next month. She has dentist appt next Tues. She says Dr Benay Spice mentioned "a clicking in her heart, mitral valve prolapse". She does have murmer on her problem list. She is asking if she needs an antibiotic before her dental cleaning and if Dr Benay Spice will call it in. She will plan to have PCP write an Rx for any future dental antibiotic needs.

## 2015-12-04 NOTE — Telephone Encounter (Signed)
Returned call to pt, instructed her to contact PCP to be evaluated. She has not been diagnosed with mitral valve prolapse. She voiced understanding.

## 2016-01-10 ENCOUNTER — Encounter: Payer: Self-pay | Admitting: Nurse Practitioner

## 2016-01-10 ENCOUNTER — Ambulatory Visit (INDEPENDENT_AMBULATORY_CARE_PROVIDER_SITE_OTHER): Payer: PPO | Admitting: Nurse Practitioner

## 2016-01-10 VITALS — BP 118/67 | HR 83 | Temp 97.1°F | Ht 64.0 in | Wt 95.0 lb

## 2016-01-10 DIAGNOSIS — F411 Generalized anxiety disorder: Secondary | ICD-10-CM

## 2016-01-10 DIAGNOSIS — Z1159 Encounter for screening for other viral diseases: Secondary | ICD-10-CM

## 2016-01-10 DIAGNOSIS — Z1212 Encounter for screening for malignant neoplasm of rectum: Secondary | ICD-10-CM | POA: Diagnosis not present

## 2016-01-10 DIAGNOSIS — Z681 Body mass index (BMI) 19 or less, adult: Secondary | ICD-10-CM | POA: Insufficient documentation

## 2016-01-10 DIAGNOSIS — L719 Rosacea, unspecified: Secondary | ICD-10-CM | POA: Diagnosis not present

## 2016-01-10 DIAGNOSIS — K912 Postsurgical malabsorption, not elsewhere classified: Secondary | ICD-10-CM | POA: Diagnosis not present

## 2016-01-10 DIAGNOSIS — E034 Atrophy of thyroid (acquired): Secondary | ICD-10-CM

## 2016-01-10 DIAGNOSIS — D509 Iron deficiency anemia, unspecified: Secondary | ICD-10-CM

## 2016-01-10 DIAGNOSIS — Z903 Acquired absence of stomach [part of]: Secondary | ICD-10-CM

## 2016-01-10 DIAGNOSIS — Z87442 Personal history of urinary calculi: Secondary | ICD-10-CM

## 2016-01-10 DIAGNOSIS — K219 Gastro-esophageal reflux disease without esophagitis: Secondary | ICD-10-CM

## 2016-01-10 DIAGNOSIS — E038 Other specified hypothyroidism: Secondary | ICD-10-CM

## 2016-01-10 MED ORDER — SERTRALINE HCL 100 MG PO TABS: 100.0000 mg | ORAL_TABLET | Freq: Every day | ORAL | Status: DC

## 2016-01-10 MED ORDER — ALPRAZOLAM 0.5 MG PO TABS: 0.5000 mg | ORAL_TABLET | Freq: Three times a day (TID) | ORAL | Status: DC | PRN

## 2016-01-10 MED ORDER — LEVOTHYROXINE SODIUM 88 MCG PO TABS: 88.0000 ug | ORAL_TABLET | Freq: Every morning | ORAL | Status: DC

## 2016-01-10 MED ORDER — OMEPRAZOLE 40 MG PO CPDR: DELAYED_RELEASE_CAPSULE | ORAL | Status: DC

## 2016-01-10 NOTE — Progress Notes (Signed)
Subjective:    Patient ID: Brittany Hensley, female    DOB: May 31, 1951, 65 y.o.   MRN: [id number]  HPI  Patient here today for follow up of chronic medical problems.  Outpatient Encounter Prescriptions as of 01/10/2016  Medication Sig  . ALPRAZolam (XANAX) 0.5 MG tablet TAKE 1 TO 2 TABLETS EVERY 8 TO 12 HOURS AS NEEDED  . CALCIUM CITRATE PO Take 600 mg by mouth 3 (three) times daily.  . chlorhexidine (PERIDEX) 0.12 % solution   . fish oil-omega-3 fatty acids 1000 MG capsule Take 1 g by mouth every evening.   Marland Kitchen levothyroxine (SYNTHROID, LEVOTHROID) 88 MCG tablet Take 1 tablet (88 mcg total) by mouth every morning.  . loratadine (CLARITIN) 10 MG tablet Take 10 mg by mouth 2 (two) times daily.    . magnesium gluconate (MAGONATE) 500 MG tablet Take 500 mg by mouth 2 (two) times daily.   . metroNIDAZOLE (METROGEL) 0.75 % gel APPLY AT BEDTIME  . Multiple Vitamin (MULTIVITAMIN) capsule Take 1 capsule by mouth 2 (two) times daily.    Marland Kitchen omeprazole (PRILOSEC) 40 MG capsule TAKE ONE (1) CAPSULE EACH DAY  . sertraline (ZOLOFT) 100 MG tablet Take 1 tablet (100 mg total) by mouth at bedtime.  . sucralfate (CARAFATE) 1 G tablet Take 1 tablet by mouth 3 (three) times daily.   . vitamin C (ASCORBIC ACID) 500 MG tablet Take 500 mg by mouth 2 (two) times daily.    . [DISCONTINUED] ergocalciferol (DRISDOL) 8000 UNIT/ML drops Place 4,000 Units under the tongue 2 (two) times daily.    No facility-administered encounter medications on file as of 01/10/2016.    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 Learta Codding- is to have Hgb checked next week  GERD Sees Dr. Watt Climes- 3 x a year- recently had ballon dilitation of stomach- narrowing from gastrectomy. Has follow up with him thursday of this week Hypothyroidism Doing well on current meds- no complaints   Rosacea Metrogel- sees Dr. [place]- currently under control Kidney stones Patient hasn't been taking- Dr. Jeffie Pollock started her on this to prevent kidney stones but patient not taking GAD Patient currently taking xanax- takes care of her mother and takes 3 x a day- last rx not written correctly. Recently started on zoloft which is helping with her stress. Zoloft dose was increased at last visit and is really helping with her stress level. Malabsorption problems due to gastrectomy Takes a lot of supplements but not sure she is absorbing everything she needs. Her weight is fluctuating but can never seem to hold on to the pounds.Weight is actually up 4 lbs. * She had her gastric outlet stretched 2 months ago which has helped be able to eat more at one time.  Review of Systems  Constitutional: Negative.   HENT: Negative.   Eyes: Negative.   Respiratory: Negative.   Cardiovascular: Negative.   Gastrointestinal: Negative.   Endocrine: Negative.   Genitourinary: Negative.   Musculoskeletal: Negative.   Allergic/Immunologic: Negative.   Neurological: Negative.   Hematological: Negative.   Psychiatric/Behavioral: Positive for agitation. Negative for suicidal ideas and sleep disturbance. The patient is nervous/anxious.   All other systems reviewed and are negative.      Objective:   Physical Exam  Constitutional: She is oriented to person, place, and time. She appears well-developed and well-nourished.  HENT:  Right Ear: Hearing, tympanic membrane, external ear and ear canal normal.  Left Ear: Hearing, tympanic membrane, external ear and ear canal normal.  Mouth/Throat: Uvula is midline, oropharynx is clear and moist and mucous membranes are normal.  Eyes: EOM are normal.  Neck: Trachea normal, normal range of motion and full passive range of motion without pain. Neck supple. No JVD present. Carotid bruit is not present. No thyromegaly present.  Cardiovascular: Normal rate, regular rhythm  and intact distal pulses.  Exam reveals no gallop and no friction rub.   Murmur (2/6 systolic) heard. Pulmonary/Chest: Effort normal and breath sounds normal.  Abdominal: Soft. Bowel sounds are normal. She exhibits no distension and no mass. There is no tenderness.  Musculoskeletal: Normal range of motion.  Lymphadenopathy:    She has no cervical adenopathy.  Neurological: She is alert and oriented to person, place, and time. She has normal reflexes.  Skin: Skin is warm and dry. There is pallor.  Psychiatric: She has a normal mood and affect. Her behavior is normal. Judgment and thought content normal.    BP 118/67 mmHg  Pulse 83  Temp(Src) 97.1 F (36.2 C) (Oral)  Ht _0  (1.626 m)  Wt 95 lb (43.092 kg)  BMI 16.30 kg/m2      Assessment & Plan:  1. BMI less than 19,adult Discussed diet and exercise for person with BMI >25 Will recheck weight in 3-6 months  2. Iron deficiency anemia Labs pending- has appoinment with hematology next week - Anemia Profile B  3. Rosacea  4. Hypothyroidism due to acquired atrophy of thyroid - CMP14+EGFR - Thyroid Panel With TSH - levothyroxine (SYNTHROID, LEVOTHROID) 88 MCG tablet; Take 1 tablet (88 mcg total) by mouth every morning.  Dispense: 30 tablet; Refill: 5  5. Gastroesophageal reflux disease without esophagitis Avoid spicy foods Do not eat 2 hours prior to bedtime - omeprazole (PRILOSEC) 40 MG capsule; TAKE ONE (1) CAPSULE EACH DAY  Dispense: 30 capsule; Refill: 5  6. Postgastrectomy malabsorption encouraged to eat 6 small meals a day with boost suplements  7. Personal history of urinary calculi  8. GAD (generalized anxiety disorder) Stress management - sertraline (ZOLOFT) 100 MG tablet; Take 1 tablet (100 mg total) by mouth at bedtime.  Dispense: 30 tablet; Refill: 5 - ALPRAZolam (XANAX) 0.5 MG tablet; Take 1 tablet (0.5 mg total) by mouth 3 (three) times daily as needed for anxiety.  Dispense: 90 tablet; Refill: 1  9.  Screening for malignant neoplasm of the rectum - Fecal occult blood, imunochemical; Future  10. Need for hepatitis C screening test - Hepatitis C antibody    Labs pending Health maintenance reviewed Diet and exercise encouraged Continue all meds Follow up  In 6 months   [place], FNP

## 2016-01-10 NOTE — Patient Instructions (Signed)
Protein Supplement Powders WHAT ARE PROTEIN SUPPLEMENT POWDERS? Protein is one of the major components of the human diet. It helps your body to build tissue, muscle, red blood cells, enzymes, antibodies, and hormones. Athletes need to include more protein in their diets than nonathletes do. Athletes place more stress on their muscles, and extra protein helps to repair these muscles. In female athletes, extra protein also helps to maintain regular menstrual cycles. Most of an athlete's daily protein needs can be met through food. However, protein supplement powders are a safe and effective way to increase daily protein intake, particularly if you:  Eat a vegan diet.  Are actively trying to build muscle mass.  Are growing.  Are recovering from injury. Protein supplement powders are supplements that contain large amounts of protein. They can be easily digested, absorbed, and used by the body. These proteins contain the essential building blocks (amino acids) that are central to muscle function, repair, and recovery. Protein supplement powders most often consist of:  Animal-based proteins, including:  Whey.  Casein.  Milk.  Egg.  Vegetable-based proteins, including soy. HOW DO I USE PROTEIN SUPPLEMENT POWDERS? Many protein supplement powders need to be mixed with water. Some products come premixed in a liquid form. Specific proportions and directions vary by product. Follow the directions that are provided on the label. Eat carbohydrates and protein, either in food or supplement form, before and after an exercise session. Consuming a protein supplement powder before lifting weights or doing resistance training helps to build muscles (protein synthesis) and may limit muscle damage during exercise. Eating a protein supplement powder within three hours after exercise can maximize muscle protein synthesis. Before and after a workout, eat protein in a ratio of one protein for every three  carbohydrates. For example, one serving of protein powder that contains 20 grams of protein should be consumed with 60 grams of carbohydrates. This is equivalent to one banana and one piece of toast. HOW MUCH PROTEIN SUPPLEMENT POWDER SHOULD I TAKE? Follow the instructions on the product label for recommended serving sizes and daily servings. Protein supplement powders should be taken in addition to, not in place of, a balanced diet to meet daily protein needs. Daily protein recommendations for adults are as follows:  Average adults should consume around 0.36 grams of protein daily per pound of body weight. For example, a 150-pound adult should consume approximately 54 grams of protein each day.  Athletes should consume 0.45-0.73 grams of protein daily per pound of body weight. For example, a 150-pound athlete should consume 67.5-109.5 grams of protein each day. WHAT ARE THE RISKS OF TAKING PROTEIN SUPPLEMENT POWDERS? The risks of taking a protein supplement powder may vary depending on the type of protein in the supplement. Risks may include:  Allergies.  If you have an allergy to cow's milk, avoid protein supplements that contain milk, whey, or casein.  If you have an allergy to soy, avoid protein supplements that contain soy or lecithin.  Extra stress on your kidneys, if you have kidney disease or diabetes. If you eat too much protein, or if your protein intake is not balanced with carbohydrates and other nutrients, you may have:  Gastrointestinal discomfort.  Constipation.  Diarrhea.  Tissue dehydration.  Nausea.  Headache.  Fatigue. WHAT ARE SOME TIPS FOR USING PROTEIN SUPPLEMENT POWDERS?  Always talk with your health care provider before starting protein supplement powders.  Eat a balanced diet of fruits, vegetables, meat, dairy, and grains, in addition to taking a  protein supplement powder.  Drink enough fluid to keep your urine clear or pale yellow. Drink fluids before,  during, and after exercise and throughout the day.  Take protein supplements only as directed. Do not take a higher dose than was recommended for you. Protein supplement powders are not regulated. Some products may contain impurities or ingredients that differ from those on the label.  Try to meet your daily protein needs with food. Eat protein-rich foods along with protein supplement powders. Foods high in protein include  Lean meat, poultry, and fish.  Eggs.  Milk, yogurt, and cheese.  Soybeans and tofu.  Beans and lentils.   This information is not intended to replace advice given to you by your health care provider. Make sure you discuss any questions you have with your health care provider.   Document Released: 07/20/2005 Document Revised: 08/10/2014 Document Reviewed: 01/02/2014 Elsevier Interactive Patient Education [employer].

## 2016-01-11 LAB — ANEMIA PROFILE B
BASOS ABS: 0 10*3/uL (ref 0.0–0.2)
Basos: 1 %
EOS (ABSOLUTE): 0.3 10*3/uL (ref 0.0–0.4)
Eos: 5 %
FERRITIN: 142 ng/mL (ref 15–150)
Hematocrit: 41.7 % (ref 34.0–46.6)
Hemoglobin: 13.1 g/dL (ref 11.1–15.9)
IMMATURE GRANULOCYTES: 0 %
IRON SATURATION: 18 % (ref 15–55)
Immature Grans (Abs): 0 10*3/uL (ref 0.0–0.1)
Iron: 51 ug/dL (ref 27–139)
LYMPHS: 27 %
Lymphocytes Absolute: 1.5 10*3/uL (ref 0.7–3.1)
MCH: 31.6 pg (ref 26.6–33.0)
MCHC: 31.4 g/dL — ABNORMAL LOW (ref 31.5–35.7)
MCV: 101 fL — AB (ref 79–97)
MONOS ABS: 0.6 10*3/uL (ref 0.1–0.9)
Monocytes: 10 %
NEUTROS PCT: 57 %
Neutrophils Absolute: 3.3 10*3/uL (ref 1.4–7.0)
Platelets: 255 10*3/uL (ref 150–379)
RBC: 4.14 x10E6/uL (ref 3.77–5.28)
RDW: 15.6 % — ABNORMAL HIGH (ref 12.3–15.4)
Retic Ct Pct: 1.4 % (ref 0.6–2.6)
TIBC: 277 ug/dL (ref 250–450)
UIBC: 226 ug/dL (ref 118–369)
WBC: 5.7 10*3/uL (ref 3.4–10.8)

## 2016-01-11 LAB — CMP14+EGFR
A/G RATIO: 1.8 (ref 1.2–2.2)
ALK PHOS: 115 IU/L (ref 39–117)
ALT: 18 IU/L (ref 0–32)
AST: 18 IU/L (ref 0–40)
Albumin: 4 g/dL (ref 3.6–4.8)
BUN/Creatinine Ratio: 21 (ref 12–28)
BUN: 21 mg/dL (ref 8–27)
CO2: 17 mmol/L — ABNORMAL LOW (ref 18–29)
Calcium: 8.9 mg/dL (ref 8.7–10.3)
Chloride: 111 mmol/L — ABNORMAL HIGH (ref 96–106)
Creatinine, Ser: 1.02 mg/dL — ABNORMAL HIGH (ref 0.57–1.00)
GFR calc Af Amer: 67 mL/min/{1.73_m2} (ref >59)
GFR, EST NON AFRICAN AMERICAN: 58 mL/min/{1.73_m2} — AB (ref >59)
GLOBULIN, TOTAL: 2.2 g/dL (ref 1.5–4.5)
Glucose: 89 mg/dL (ref 65–99)
POTASSIUM: 3.7 mmol/L (ref 3.5–5.2)
SODIUM: 144 mmol/L (ref 134–144)
Total Protein: 6.2 g/dL (ref 6.0–8.5)

## 2016-01-11 LAB — THYROID PANEL WITH TSH
Free Thyroxine Index: 0.9 — ABNORMAL LOW (ref 1.2–4.9)
T3 Uptake Ratio: 30 % (ref 24–39)
T4 TOTAL: 3.1 ug/dL — AB (ref 4.5–12.0)
TSH: 2.49 u[IU]/mL (ref 0.450–4.500)

## 2016-01-11 LAB — HEPATITIS C ANTIBODY: Hep C Virus Ab: <0.1 s/co ratio (ref 0.0–0.9)

## 2016-01-13 ENCOUNTER — Telehealth: Payer: Self-pay | Admitting: Nurse Practitioner

## 2016-01-13 NOTE — Telephone Encounter (Signed)
Labs up front for pickup

## 2016-01-14 ENCOUNTER — Ambulatory Visit: Payer: PPO | Admitting: Nurse Practitioner

## 2016-01-14 ENCOUNTER — Other Ambulatory Visit: Payer: PPO

## 2016-01-15 ENCOUNTER — Telehealth: Payer: Self-pay | Admitting: Oncology

## 2016-01-15 ENCOUNTER — Telehealth: Payer: Self-pay | Admitting: *Deleted

## 2016-01-15 NOTE — Telephone Encounter (Signed)
pt cld to CX lab for 6/15-no r/s -gave pt appt fro 8/30@8 

## 2016-01-15 NOTE — Telephone Encounter (Signed)
Message from pt requesting to cancel labs. Recent labs routed to Dr. Benay Spice. Pt would like labs checked in 2 mos. Requests early appt on a Thurs. Will review with MD.

## 2016-01-16 ENCOUNTER — Other Ambulatory Visit: Payer: PPO

## 2016-01-16 DIAGNOSIS — K311 Adult hypertrophic pyloric stenosis: Secondary | ICD-10-CM | POA: Diagnosis not present

## 2016-01-25 ENCOUNTER — Other Ambulatory Visit: Payer: Self-pay | Admitting: Nurse Practitioner

## 2016-01-27 NOTE — Telephone Encounter (Signed)
Please call in xanax with 1 refills 

## 2016-01-27 NOTE — Telephone Encounter (Signed)
Last filled 12/26/15, last seen 01/10/16. [facility]

## 2016-01-28 NOTE — Telephone Encounter (Signed)
rx called into pharmacy

## 2016-03-18 ENCOUNTER — Other Ambulatory Visit: Payer: PPO

## 2016-04-01 ENCOUNTER — Other Ambulatory Visit (HOSPITAL_BASED_OUTPATIENT_CLINIC_OR_DEPARTMENT_OTHER): Payer: PPO

## 2016-04-01 ENCOUNTER — Telehealth: Payer: Self-pay | Admitting: *Deleted

## 2016-04-01 DIAGNOSIS — D509 Iron deficiency anemia, unspecified: Secondary | ICD-10-CM | POA: Diagnosis not present

## 2016-04-01 LAB — FERRITIN: FERRITIN: 80 ng/mL (ref 9–269)

## 2016-04-01 LAB — CBC WITH DIFFERENTIAL/PLATELET
BASO%: 0.8 % (ref 0.0–2.0)
Basophils Absolute: 0 10*3/uL (ref 0.0–0.1)
EOS ABS: 0.2 10*3/uL (ref 0.0–0.5)
EOS%: 4.5 % (ref 0.0–7.0)
HEMATOCRIT: 39.1 % (ref 34.8–46.6)
HEMOGLOBIN: 12.6 g/dL (ref 11.6–15.9)
LYMPH#: 1.6 10*3/uL (ref 0.9–3.3)
LYMPH%: 32.7 % (ref 14.0–49.7)
MCH: 32.2 pg (ref 25.1–34.0)
MCHC: 32.1 g/dL (ref 31.5–36.0)
MCV: 100.1 fL (ref 79.5–101.0)
MONO#: 0.6 10*3/uL (ref 0.1–0.9)
MONO%: 12.3 % (ref 0.0–14.0)
NEUT%: 49.7 % (ref 38.4–76.8)
NEUTROS ABS: 2.4 10*3/uL (ref 1.5–6.5)
PLATELETS: 248 10*3/uL (ref 145–400)
RBC: 3.9 10*6/uL (ref 3.70–5.45)
RDW: 13.7 % (ref 11.2–14.5)
WBC: 4.9 10*3/uL (ref 3.9–10.3)

## 2016-04-01 NOTE — Telephone Encounter (Signed)
Called pt with HGB and ferritin result, she voiced understanding. Message to schedulers to add lab to office visit in Dec.

## 2016-04-01 NOTE — Telephone Encounter (Signed)
-----   Message from Ladell Pier, MD sent at 04/01/2016  1:41 PM EDT ----- Please call patient, hb and ferritin are ok, f/u as scheduled

## 2016-04-02 NOTE — Addendum Note (Signed)
Addended by: Brien Few on: 04/02/2016 02:40 PM   Modules accepted: Orders

## 2016-04-02 NOTE — Telephone Encounter (Signed)
Message from pt requesting to have labs checked in 2 mos. She is not comfortable waiting until Dec visit. Reviewed with Dr. Benay Spice: Order received for lab in 2 mos. Orders entered.

## 2016-04-16 ENCOUNTER — Encounter: Payer: Self-pay | Admitting: Nurse Practitioner

## 2016-04-16 ENCOUNTER — Ambulatory Visit (INDEPENDENT_AMBULATORY_CARE_PROVIDER_SITE_OTHER): Payer: PPO | Admitting: Nurse Practitioner

## 2016-04-16 VITALS — BP 111/71 | HR 81 | Temp 97.8°F | Ht 64.0 in | Wt 99.0 lb

## 2016-04-16 DIAGNOSIS — K311 Adult hypertrophic pyloric stenosis: Secondary | ICD-10-CM | POA: Diagnosis not present

## 2016-04-16 DIAGNOSIS — K219 Gastro-esophageal reflux disease without esophagitis: Secondary | ICD-10-CM | POA: Diagnosis not present

## 2016-04-16 DIAGNOSIS — E038 Other specified hypothyroidism: Secondary | ICD-10-CM

## 2016-04-16 DIAGNOSIS — E034 Atrophy of thyroid (acquired): Secondary | ICD-10-CM | POA: Diagnosis not present

## 2016-04-16 DIAGNOSIS — Z681 Body mass index (BMI) 19 or less, adult: Secondary | ICD-10-CM

## 2016-04-16 DIAGNOSIS — D509 Iron deficiency anemia, unspecified: Secondary | ICD-10-CM | POA: Diagnosis not present

## 2016-04-16 DIAGNOSIS — F411 Generalized anxiety disorder: Secondary | ICD-10-CM

## 2016-04-16 MED ORDER — ALPRAZOLAM 0.5 MG PO TABS: 0.5000 mg | ORAL_TABLET | Freq: Three times a day (TID) | ORAL | 1 refills | Status: DC

## 2016-04-16 MED ORDER — ALPRAZOLAM 0.5 MG PO TABS
0.5000 mg | ORAL_TABLET | Freq: Three times a day (TID) | ORAL | 1 refills | Status: DC
Start: 2016-04-16 — End: 2016-06-15

## 2016-04-16 NOTE — Progress Notes (Signed)
Subjective:    Patient ID: Brittany Hensley, female    DOB: Jun 15, 1951, 65 y.o.   MRN: [id number]  HPI  Patient here today for follow up of chronic medical problems. No changes since last visit. No complaints today.  Outpatient Encounter Prescriptions as of 04/16/2016  Medication Sig  . ALPRAZolam (XANAX) 0.5 MG tablet TAKE 1 TO 2 TABLETS EVERY 8 TO 12 HOURS AS NEEDED  . CALCIUM CITRATE PO Take 600 mg by mouth 3 (three) times daily.  . fish oil-omega-3 fatty acids 1000 MG capsule Take 1 g by mouth every evening.   . lactobacillus acidophilus (BACID) TABS tablet Take 2 tablets by mouth daily.  Marland Kitchen levothyroxine (SYNTHROID, LEVOTHROID) 88 MCG tablet Take 1 tablet (88 mcg total) by mouth every morning.  . loratadine (CLARITIN) 10 MG tablet Take 10 mg by mouth 2 (two) times daily.    . magnesium gluconate (MAGONATE) 500 MG tablet Take 500 mg by mouth 2 (two) times daily.   . metroNIDAZOLE (METROGEL) 0.75 % gel APPLY AT BEDTIME  . Multiple Vitamin (MULTIVITAMIN) capsule Take 1 capsule by mouth 2 (two) times daily.    Marland Kitchen omeprazole (PRILOSEC) 40 MG capsule TAKE ONE (1) CAPSULE EACH DAY  . sertraline (ZOLOFT) 100 MG tablet Take 1 tablet (100 mg total) by mouth at bedtime.  . sucralfate (CARAFATE) 1 G tablet Take 1 tablet by mouth 3 (three) times daily.   . vitamin C (ASCORBIC ACID) 500 MG tablet Take 500 mg by mouth 2 (two) times daily.       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 Learta Codding- is to have Hgb checked next week  GERD Sees Dr. Watt Climes- 3 x a year- recently had ballon dilitation of stomach- narrowing from gastrectomy. Has follow up with him thursday of this week Hypothyroidism Doing well on current meds- no complaints  Rosacea Metrogel- sees Dr. Syble Creek- currently under control Kidney stones Patient hasn't been taking- Dr. Jeffie Pollock  started her on this to prevent kidney stones but patient not taking GAD Patient currently taking xanax- takes care of her mother and takes 3 x a day- last rx not written correctly. Recently started on zoloft which is helping with her stress. Zoloft dose was increased at last visit and is really helping with her stress level. Malabsorption problems due to gastrectomy Takes a lot of supplements but not sure she is absorbing everything she needs. Her weight is fluctuating but can never seem to hold on to the pounds.Weight is actually up 4 lbs. She says that she is eating better and appetite seems to be better.  Review of Systems  Constitutional: Negative.   HENT: Negative.   Eyes: Negative.   Respiratory: Negative.   Cardiovascular: Negative.   Gastrointestinal: Negative.   Endocrine: Negative.   Genitourinary: Negative.   Musculoskeletal: Negative.   Allergic/Immunologic: Negative.   Neurological: Negative.   Hematological: Negative.   Psychiatric/Behavioral: Positive for agitation. Negative for sleep disturbance and suicidal ideas. The patient is nervous/anxious.   All other systems reviewed and are negative.      Objective:   Physical Exam  Constitutional: She is oriented to person, place, and time. She appears well-developed and well-nourished.  HENT:  Right Ear: Hearing, tympanic membrane, external ear and ear canal normal.  Left Ear: Hearing, tympanic membrane, external ear and ear canal normal.  Mouth/Throat: Uvula is midline, oropharynx is clear and moist and mucous membranes are normal.  Eyes: EOM are normal.  Neck: Trachea normal, normal range of motion and full passive range of motion without pain. Neck supple. No JVD present. Carotid bruit is not present. No thyromegaly present.  Cardiovascular: Normal rate, regular rhythm and intact distal pulses.  Exam reveals no gallop and no friction rub.   Murmur (2/6 systolic) heard. Pulmonary/Chest: Effort normal and breath sounds  normal.  Abdominal: Soft. Bowel sounds are normal. She exhibits no distension and no mass. There is no tenderness.  Musculoskeletal: Normal range of motion.  Lymphadenopathy:    She has no cervical adenopathy.  Neurological: She is alert and oriented to person, place, and time. She has normal reflexes.  Skin: Skin is warm and dry. There is pallor.  Psychiatric: She has a normal mood and affect. Her behavior is normal. Judgment and thought content normal.    BP 111/71   Pulse 81   Temp 97.8 F (36.6 C) (Oral)   Ht 5' 4"  (1.626 m)   Wt 99 lb (44.9 kg)   BMI 16.99 kg/m       Assessment & Plan:  1. Gastroesophageal reflux disease without esophagitis Avoid spicy foods Do not eat 2 hours prior to bedtime  2. Gastric outlet obstruction Eat 6 small meals a day Continue protein drinks - CMP14+EGFR - Lipid panel  3. Hypothyroidism due to acquired atrophy of thyroid - Thyroid Panel With TSH  4. BMI less than 19,adult Discussed weight gain  5. GAD (generalized anxiety disorder) Stress management  6. Iron deficiency anemia Keep follow up with hematologiist    Labs pending Health maintenance reviewed Diet and exercise encouraged Continue all meds Follow up  In 6 months   [place], FNP

## 2016-04-16 NOTE — Addendum Note (Signed)
Addended by: Chevis Pretty on: 04/16/2016 10:20 AM   Modules accepted: Orders

## 2016-04-17 ENCOUNTER — Other Ambulatory Visit: Payer: Self-pay | Admitting: Nurse Practitioner

## 2016-04-17 LAB — CMP14+EGFR
A/G RATIO: 2 (ref 1.2–2.2)
ALT: 19 IU/L (ref 0–32)
AST: 19 IU/L (ref 0–40)
Albumin: 3.8 g/dL (ref 3.6–4.8)
Alkaline Phosphatase: 88 IU/L (ref 39–117)
BUN/Creatinine Ratio: 23 (ref 12–28)
BUN: 25 mg/dL (ref 8–27)
Bilirubin Total: <0.2 mg/dL (ref 0.0–1.2)
CALCIUM: 8.9 mg/dL (ref 8.7–10.3)
CHLORIDE: 107 mmol/L — AB (ref 96–106)
CO2: 17 mmol/L — AB (ref 18–29)
Creatinine, Ser: 1.11 mg/dL — ABNORMAL HIGH (ref 0.57–1.00)
GFR calc Af Amer: 60 mL/min/{1.73_m2} (ref >59)
GFR, EST NON AFRICAN AMERICAN: 52 mL/min/{1.73_m2} — AB (ref >59)
GLUCOSE: 95 mg/dL (ref 65–99)
Globulin, Total: 1.9 g/dL (ref 1.5–4.5)
POTASSIUM: 4.4 mmol/L (ref 3.5–5.2)
Sodium: 142 mmol/L (ref 134–144)
TOTAL PROTEIN: 5.7 g/dL — AB (ref 6.0–8.5)

## 2016-04-17 LAB — LIPID PANEL
CHOL/HDL RATIO: 2.9 ratio (ref 0.0–4.4)
Cholesterol, Total: 191 mg/dL (ref 100–199)
HDL: 65 mg/dL (ref >39)
LDL Calculated: 96 mg/dL (ref 0–99)
TRIGLYCERIDES: 152 mg/dL — AB (ref 0–149)
VLDL Cholesterol Cal: 30 mg/dL (ref 5–40)

## 2016-04-17 LAB — THYROID PANEL WITH TSH
FREE THYROXINE INDEX: 0.8 — AB (ref 1.2–4.9)
T3 UPTAKE RATIO: 25 % (ref 24–39)
T4, Total: 3 ug/dL — ABNORMAL LOW (ref 4.5–12.0)
TSH: 4.69 u[IU]/mL — ABNORMAL HIGH (ref 0.450–4.500)

## 2016-05-07 ENCOUNTER — Other Ambulatory Visit: Payer: Self-pay | Admitting: Nurse Practitioner

## 2016-05-07 ENCOUNTER — Telehealth: Payer: Self-pay | Admitting: *Deleted

## 2016-05-07 DIAGNOSIS — E034 Atrophy of thyroid (acquired): Secondary | ICD-10-CM

## 2016-05-07 MED ORDER — LEVOTHYROXINE SODIUM 100 MCG PO TABS: 100.0000 ug | ORAL_TABLET | Freq: Every day | ORAL | 5 refills | Status: DC

## 2016-05-07 NOTE — Telephone Encounter (Signed)
Pt aware medication has been changed & to come back in 6 wks to have labs drawn again

## 2016-05-07 NOTE — Telephone Encounter (Signed)
Increased levothyroxin- rx sentt to pharmacy- need  To have labs checked in 6 weeks

## 2016-06-03 ENCOUNTER — Other Ambulatory Visit (HOSPITAL_BASED_OUTPATIENT_CLINIC_OR_DEPARTMENT_OTHER): Payer: PPO

## 2016-06-03 DIAGNOSIS — D509 Iron deficiency anemia, unspecified: Secondary | ICD-10-CM

## 2016-06-03 LAB — CBC WITH DIFFERENTIAL/PLATELET
BASO%: 0.7 % (ref 0.0–2.0)
BASOS ABS: 0 10*3/uL (ref 0.0–0.1)
EOS ABS: 0.3 10*3/uL (ref 0.0–0.5)
EOS%: 5.7 % (ref 0.0–7.0)
HCT: 38.7 % (ref 34.8–46.6)
HGB: 12.4 g/dL (ref 11.6–15.9)
LYMPH%: 30.2 % (ref 14.0–49.7)
MCH: 32.7 pg (ref 25.1–34.0)
MCHC: 32.1 g/dL (ref 31.5–36.0)
MCV: 101.9 fL — AB (ref 79.5–101.0)
MONO#: 0.5 10*3/uL (ref 0.1–0.9)
MONO%: 11.1 % (ref 0.0–14.0)
NEUT#: 2.5 10*3/uL (ref 1.5–6.5)
NEUT%: 52.3 % (ref 38.4–76.8)
Platelets: 246 10*3/uL (ref 145–400)
RBC: 3.8 10*6/uL (ref 3.70–5.45)
RDW: 14.1 % (ref 11.2–14.5)
WBC: 4.7 10*3/uL (ref 3.9–10.3)
lymph#: 1.4 10*3/uL (ref 0.9–3.3)

## 2016-06-03 LAB — FERRITIN: FERRITIN: 40 ng/mL (ref 9–269)

## 2016-06-15 ENCOUNTER — Other Ambulatory Visit: Payer: Self-pay | Admitting: *Deleted

## 2016-06-15 MED ORDER — ALPRAZOLAM 0.5 MG PO TABS: 0.5000 mg | ORAL_TABLET | Freq: Three times a day (TID) | ORAL | 1 refills | Status: DC

## 2016-06-15 NOTE — Telephone Encounter (Signed)
Please call in xanax with 1 refills 

## 2016-06-16 ENCOUNTER — Telehealth: Payer: Self-pay | Admitting: *Deleted

## 2016-06-16 ENCOUNTER — Telehealth: Payer: Self-pay | Admitting: Oncology

## 2016-06-16 NOTE — Telephone Encounter (Signed)
Confirmed 11/29 appt date/time w/ pt per [facility]

## 2016-06-16 NOTE — Telephone Encounter (Signed)
Call from pt requesting ferritin results. 40. She is requesting oral iron, she reports Dr. Benay Spice told her she would get iron if ferritin drops below 50. Pt asks for appt week of 11/27. OK, per Dr. Benay Spice. Message to schedulers for appt.

## 2016-06-29 ENCOUNTER — Other Ambulatory Visit: Payer: PPO

## 2016-06-29 DIAGNOSIS — E034 Atrophy of thyroid (acquired): Secondary | ICD-10-CM

## 2016-06-30 LAB — THYROID PANEL WITH TSH
Free Thyroxine Index: 0.8 — ABNORMAL LOW (ref 1.2–4.9)
T3 Uptake Ratio: 24 % (ref 24–39)
T4 TOTAL: 3.3 ug/dL — AB (ref 4.5–12.0)
TSH: 5.09 u[IU]/mL — AB (ref 0.450–4.500)

## 2016-07-01 ENCOUNTER — Ambulatory Visit (HOSPITAL_BASED_OUTPATIENT_CLINIC_OR_DEPARTMENT_OTHER): Payer: PPO

## 2016-07-01 ENCOUNTER — Other Ambulatory Visit: Payer: Self-pay | Admitting: Nurse Practitioner

## 2016-07-01 ENCOUNTER — Other Ambulatory Visit: Payer: Self-pay | Admitting: Oncology

## 2016-07-01 VITALS — BP 111/58 | HR 85 | Temp 98.5°F | Resp 16

## 2016-07-01 DIAGNOSIS — D508 Other iron deficiency anemias: Secondary | ICD-10-CM

## 2016-07-01 DIAGNOSIS — D509 Iron deficiency anemia, unspecified: Secondary | ICD-10-CM

## 2016-07-01 MED ORDER — ACETAMINOPHEN 325 MG PO TABS
ORAL_TABLET | ORAL | Status: AC
  Filled 2016-07-01: qty 2

## 2016-07-01 MED ORDER — DIPHENHYDRAMINE HCL 25 MG PO CAPS
ORAL_CAPSULE | ORAL | Status: AC
  Filled 2016-07-01: qty 2

## 2016-07-01 MED ORDER — SODIUM CHLORIDE 0.9 % IV SOLN
1500.0000 mg | Freq: Once | INTRAVENOUS | Status: AC
  Administered 2016-07-01: 1500 mg via INTRAVENOUS
  Filled 2016-07-01: qty 30

## 2016-07-01 MED ORDER — LEVOTHYROXINE SODIUM 112 MCG PO TABS: 112.0000 ug | ORAL_TABLET | Freq: Every day | ORAL | 5 refills | Status: DC

## 2016-07-01 MED ORDER — SODIUM CHLORIDE 0.9 % IV SOLN
Freq: Once | INTRAVENOUS | Status: AC
  Administered 2016-07-01: 08:00:00 via INTRAVENOUS

## 2016-07-01 MED ORDER — DIPHENHYDRAMINE HCL 25 MG PO TABS
50.0000 mg | ORAL_TABLET | Freq: Once | ORAL | Status: AC
  Administered 2016-07-01: 50 mg via ORAL
  Filled 2016-07-01: qty 2

## 2016-07-01 MED ORDER — ACETAMINOPHEN 325 MG PO TABS
650.0000 mg | ORAL_TABLET | Freq: Once | ORAL | Status: AC
  Administered 2016-07-01: 650 mg via ORAL

## 2016-07-01 MED ORDER — SODIUM CHLORIDE 0.9 % IV SOLN
50.0000 mg | Freq: Once | INTRAVENOUS | Status: AC
  Administered 2016-07-01: 50 mg via INTRAVENOUS
  Filled 2016-07-01: qty 1

## 2016-07-01 NOTE — Patient Instructions (Signed)

## 2016-07-02 ENCOUNTER — Telehealth: Payer: Self-pay | Admitting: *Deleted

## 2016-07-02 NOTE — Telephone Encounter (Signed)
-----   Message from [facility], [place] sent at 07/01/2016  9:20 PM EST ----- Tsh is increasing- new levothyrxoin rx sent to pharmacy- need to repeat labs in 6 weeks

## 2016-07-02 NOTE — Telephone Encounter (Signed)
Pt notified of results Verbalizes understanding Per pt Levothyroxine was just increased < 3 wks ago Would like to try this dose and rck labs at appt on 07/20/2016

## 2016-07-12 ENCOUNTER — Other Ambulatory Visit: Payer: Self-pay | Admitting: Nurse Practitioner

## 2016-07-13 ENCOUNTER — Other Ambulatory Visit: Payer: Self-pay

## 2016-07-14 ENCOUNTER — Ambulatory Visit (INDEPENDENT_AMBULATORY_CARE_PROVIDER_SITE_OTHER): Payer: PPO | Admitting: Nurse Practitioner

## 2016-07-14 ENCOUNTER — Telehealth: Payer: Self-pay | Admitting: *Deleted

## 2016-07-14 ENCOUNTER — Encounter: Payer: Self-pay | Admitting: Nurse Practitioner

## 2016-07-14 VITALS — BP 108/63 | HR 80 | Temp 97.3°F | Ht 64.0 in | Wt 99.0 lb

## 2016-07-14 DIAGNOSIS — D508 Other iron deficiency anemias: Secondary | ICD-10-CM | POA: Diagnosis not present

## 2016-07-14 DIAGNOSIS — K219 Gastro-esophageal reflux disease without esophagitis: Secondary | ICD-10-CM | POA: Diagnosis not present

## 2016-07-14 DIAGNOSIS — E034 Atrophy of thyroid (acquired): Secondary | ICD-10-CM | POA: Diagnosis not present

## 2016-07-14 DIAGNOSIS — Z681 Body mass index (BMI) 19 or less, adult: Secondary | ICD-10-CM | POA: Diagnosis not present

## 2016-07-14 DIAGNOSIS — Z1212 Encounter for screening for malignant neoplasm of rectum: Secondary | ICD-10-CM | POA: Diagnosis not present

## 2016-07-14 DIAGNOSIS — F411 Generalized anxiety disorder: Secondary | ICD-10-CM | POA: Diagnosis not present

## 2016-07-14 DIAGNOSIS — Z903 Acquired absence of stomach [part of]: Secondary | ICD-10-CM | POA: Diagnosis not present

## 2016-07-14 DIAGNOSIS — Z1211 Encounter for screening for malignant neoplasm of colon: Secondary | ICD-10-CM | POA: Diagnosis not present

## 2016-07-14 DIAGNOSIS — K311 Adult hypertrophic pyloric stenosis: Secondary | ICD-10-CM | POA: Diagnosis not present

## 2016-07-14 DIAGNOSIS — K912 Postsurgical malabsorption, not elsewhere classified: Secondary | ICD-10-CM | POA: Diagnosis not present

## 2016-07-14 MED ORDER — SERTRALINE HCL 100 MG PO TABS: 100.0000 mg | ORAL_TABLET | Freq: Every day | ORAL | 5 refills | Status: DC

## 2016-07-14 MED ORDER — OMEPRAZOLE 40 MG PO CPDR: DELAYED_RELEASE_CAPSULE | ORAL | 5 refills | Status: DC

## 2016-07-14 NOTE — Telephone Encounter (Signed)
Message from pt requesting to move her 12/15 appointment out to January since she recently received iron infusion. Message to schedulers to call pt with appt change.

## 2016-07-14 NOTE — Progress Notes (Signed)
Subjective:    Patient ID: Brittany Hensley, female    DOB: 11/30/1950, 65 y.o.   MRN: [id number]  HPI  Patient here today for follow up of chronic medical problems. No changes since last visit. No complaints today.  Outpatient Encounter Prescriptions as of 04/16/2016  Medication Sig  . ALPRAZolam (XANAX) 0.5 MG tablet TAKE 1 TO 2 TABLETS EVERY 8 TO 12 HOURS AS NEEDED  . CALCIUM CITRATE PO Take 600 mg by mouth 3 (three) times daily.  . fish oil-omega-3 fatty acids 1000 MG capsule Take 1 g by mouth every evening.   . lactobacillus acidophilus (BACID) TABS tablet Take 2 tablets by mouth daily.  Marland Kitchen levothyroxine (SYNTHROID, LEVOTHROID) 88 MCG tablet Take 1 tablet (88 mcg total) by mouth every morning.  . loratadine (CLARITIN) 10 MG tablet Take 10 mg by mouth 2 (two) times daily.    . magnesium gluconate (MAGONATE) 500 MG tablet Take 500 mg by mouth 2 (two) times daily.   . metroNIDAZOLE (METROGEL) 0.75 % gel APPLY AT BEDTIME  . Multiple Vitamin (MULTIVITAMIN) capsule Take 1 capsule by mouth 2 (two) times daily.    Marland Kitchen omeprazole (PRILOSEC) 40 MG capsule TAKE ONE (1) CAPSULE EACH DAY  . sertraline (ZOLOFT) 100 MG tablet Take 1 tablet (100 mg total) by mouth at bedtime.  . sucralfate (CARAFATE) 1 G tablet Take 1 tablet by mouth 3 (three) times daily.   . vitamin C (ASCORBIC ACID) 500 MG tablet Take 500 mg by mouth 2 (two) times daily.     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 Learta Codding- is to have Hgb checked next week  GERD Sees Dr. Watt Climes- 3 x a year- recently had ballon dilitation of stomach- narrowing from gastrectomy. Has follow up with him thursday of this week Hypothyroidism Doing well on current meds- no complaints  Rosacea Metrogel- sees Dr. Syble Creek- currently under control Kidney stones Patient hasn't been taking- Dr. Jeffie Pollock started  her on this to prevent kidney stones but patient not taking GAD Patient currently taking xanax- takes care of her mother and takes 3 x a day- last rx not written correctly. Recently started on zoloft which is helping with her stress. Zoloft dose was increased at last visit and is really helping with her stress level. Malabsorption problems due to gastrectomy/gastric outlet obstruction Takes a lot of supplements but not sure she is absorbing everything she needs. Her weight is fluctuating but can never seem to hold on to the pounds.Weight is actually up 4 lbs. She says that she is eating better and appetite seems to be better.  Review of Systems  Constitutional: Negative.   HENT: Negative.   Eyes: Negative.   Respiratory: Negative.   Cardiovascular: Negative.   Gastrointestinal: Negative.   Endocrine: Negative.   Genitourinary: Negative.   Musculoskeletal: Negative.   Allergic/Immunologic: Negative.   Neurological: Negative.   Hematological: Negative.   Psychiatric/Behavioral: Positive for agitation. Negative for sleep disturbance and suicidal ideas. The patient is nervous/anxious.   All other systems reviewed and are negative.      Objective:   Physical Exam  Constitutional: She is oriented to person, place, and time. She appears well-developed and well-nourished.  HENT:  Right Ear: Hearing, tympanic membrane, external ear and ear canal normal.  Left Ear: Hearing, tympanic membrane, external ear and ear canal normal.  Mouth/Throat: Uvula is midline, oropharynx is clear and moist and mucous membranes are normal.  Eyes: EOM are normal.  Neck: Trachea normal, normal range of motion and full passive range of motion without pain. Neck supple. No JVD present. Carotid bruit is not present. No thyromegaly present.  Cardiovascular: Normal rate, regular rhythm and intact distal pulses.  Exam reveals no gallop and no friction rub.   Murmur (2/6 systolic) heard. Pulmonary/Chest: Effort normal  and breath sounds normal.  Abdominal: Soft. Bowel sounds are normal. She exhibits no distension and no mass. There is no tenderness.  Musculoskeletal: Normal range of motion.  Lymphadenopathy:    She has no cervical adenopathy.  Neurological: She is alert and oriented to person, place, and time. She has normal reflexes.  Skin: Skin is warm and dry. There is pallor.  Psychiatric: She has a normal mood and affect. Her behavior is normal. Judgment and thought content normal.    BP 108/63   Pulse 80   Temp 97.3 F (36.3 C) (Oral)   Ht 5' 4"  (1.626 m)   Wt 99 lb (44.9 kg)   BMI 16.99 kg/m       Assessment & Plan:  1. Gastric outlet obstruction Keep follow up with GI  2. Gastroesophageal reflux disease without esophagitis Avoid spicy foods Do not eat 2 hours prior to bedtime  3. Postgastrectomy malabsorption Continue premier shakes  4. Hypothyroidism due to acquired atrophy of thyroid - CMP14+EGFR - Lipid panel - Thyroid Panel With TSH  5. BMI less than 19,adult Continue premier supplements  6. GAD (generalized anxiety disorder) Stress management  7. Other iron deficiency anemia - Anemia Profile B  8. Encounter for colorectal cancer screening - Fecal occult blood, imunochemical; Future  Meds ordered this encounter  Medications  . levothyroxine (SYNTHROID, LEVOTHROID) 100 MCG tablet  . sertraline (ZOLOFT) 100 MG tablet    Sig: Take 1 tablet (100 mg total) by mouth at bedtime.    Dispense:  30 tablet    Refill:  5    Order Specific Question:   Supervising Provider    Answer:   VINCENT, CAROL L [[id number]]  . omeprazole (PRILOSEC) 40 MG capsule    Sig: TAKE ONE (1) CAPSULE EACH DAY    Dispense:  30 capsule    Refill:  5    Order Specific Question:   Supervising Provider    Answer:   [place] pending Health maintenance reviewed Diet and exercise encouraged Continue all meds Follow up  In 6 months   [place], FNP

## 2016-07-14 NOTE — Telephone Encounter (Signed)
Please call in xanax with 1 refills 

## 2016-07-14 NOTE — Patient Instructions (Signed)
High-Protein and High-Calorie Diet Introduction Eating high-protein and high-calorie foods can help you to gain weight, heal after an injury, and recover after an illness or surgery. What is my plan? The specific amount of daily protein and calories you need depends on:  Your body weight.  The reason this diet is recommended for you. Generally, a high-protein, high-calorie diet involves:  Eating 250-500 extra calories each day.  Making sure that 10-35% of your daily calories come from protein. Talk to your health care provider about how much protein and how many calories you need each day. Follow the diet as directed by your health care provider. What do I need to know about this diet?  Ask your health care provider if you should take a nutritional supplement.  Try to eat six small meals each day instead of three large meals.  Eat a balanced diet, including one food that is high in protein at each meal.  Keep nutritious snacks handy, such as nuts, trail mixes, dried fruit, and yogurt.  If you have kidney disease or diabetes, eating too much protein may put extra stress on your kidneys. Talk to your health care provider if you have either of those conditions. What are some high-protein foods? Grains  Quinoa. Bulgur wheat. Vegetables  Soybeans. Peas. Meats and Other Protein Sources  Beef, pork, and poultry. Fish and seafood. Eggs. Tofu. Textured vegetable protein (TVP). Peanut butter. Nuts and seeds. Dried beans. Protein powders. Dairy  Whole milk. Whole-milk yogurt. Powdered milk. Cheese. [employer]. Eggnog. Beverages  High-protein supplement drinks. Soy milk. Other  Protein bars. The items listed above may not be a complete list of recommended foods or beverages. Contact your dietitian for more options.  What are some high-calorie foods? Grains  Pasta. Quick breads. Muffins. Pancakes. Ready-to-eat cereal. Vegetables  Vegetables cooked in oil or butter. Fried  potatoes. Fruits  Dried fruit. Fruit leather. Canned fruit in syrup. Fruit juice. Avocados. Meats and Other Protein Sources  Peanut butter. Nuts and seeds. Dairy  Heavy cream. Whipped cream. Cream cheese. Sour cream. Ice cream. Custard. Pudding. Beverages  Meal-replacement beverages. Nutrition shakes. Fruit juice. Sugar-sweetened soft drinks. Condiments  Salad dressing. Mayonnaise. Alfredo sauce. Fruit preserves or jelly. Honey. Syrup. Sweets/Desserts  Cake. Cookies. Pie. Pastries. Candy bars. Chocolate. Fats and Oils  Butter or margarine. Oil. Gravy. Other  Meal-replacement bars. The items listed above may not be a complete list of recommended foods or beverages. Contact your dietitian for more options.  What are some tips for including high-protein and high-calorie foods in my diet?  Add whole milk, half-and-half, or heavy cream to cereal, pudding, soup, or hot cocoa.  Add whole milk to instant breakfast drinks.  Add peanut butter to oatmeal or smoothies.  Add powdered milk to baked goods, smoothies, or milkshakes.  Add powdered milk, cream, or butter to mashed potatoes.  Add cheese to cooked vegetables.  Make whole-milk yogurt parfaits. Top them with granola, fruit, or nuts.  Add cottage cheese to your fruit.  Add avocados, cheese, or both to sandwiches or salads.  Add meat, poultry, or seafood to rice, pasta, casseroles, salads, and soups.  Use mayonnaise when making egg salad, chicken salad, or tuna salad.  Use peanut butter as a topping for pretzels, celery, or crackers.  Add beans to casseroles, dips, and spreads.  Add pureed beans to sauces and soups.  Replace calorie-free drinks with calorie-containing drinks, such as milk and fruit juice. This information is not intended to replace advice given to  you by your health care provider. Make sure you discuss any questions you have with your health care provider. Document Released: 07/20/2005 Document Revised:  12/26/2015 Document Reviewed: 01/02/2014  2017 Elsevier

## 2016-07-15 LAB — CMP14+EGFR
A/G RATIO: 2.3 — AB (ref 1.2–2.2)
ALT: 22 IU/L (ref 0–32)
AST: 25 IU/L (ref 0–40)
Albumin: 3.6 g/dL (ref 3.6–4.8)
Alkaline Phosphatase: 69 IU/L (ref 39–117)
BUN/Creatinine Ratio: 23 (ref 12–28)
BUN: 23 mg/dL (ref 8–27)
CALCIUM: 8.3 mg/dL — AB (ref 8.7–10.3)
CHLORIDE: 111 mmol/L — AB (ref 96–106)
CO2: 15 mmol/L — ABNORMAL LOW (ref 18–29)
Creatinine, Ser: 1 mg/dL (ref 0.57–1.00)
GFR calc Af Amer: 68 mL/min/{1.73_m2} (ref >59)
GFR, EST NON AFRICAN AMERICAN: 59 mL/min/{1.73_m2} — AB (ref >59)
GLOBULIN, TOTAL: 1.6 g/dL (ref 1.5–4.5)
Glucose: 87 mg/dL (ref 65–99)
POTASSIUM: 4.4 mmol/L (ref 3.5–5.2)
SODIUM: 143 mmol/L (ref 134–144)
Total Protein: 5.2 g/dL — ABNORMAL LOW (ref 6.0–8.5)

## 2016-07-15 LAB — LIPID PANEL
CHOL/HDL RATIO: 2.6 ratio (ref 0.0–4.4)
Cholesterol, Total: 161 mg/dL (ref 100–199)
HDL: 62 mg/dL (ref >39)
LDL Calculated: 75 mg/dL (ref 0–99)
TRIGLYCERIDES: 122 mg/dL (ref 0–149)
VLDL Cholesterol Cal: 24 mg/dL (ref 5–40)

## 2016-07-15 LAB — ANEMIA PROFILE B
BASOS: 1 %
Basophils Absolute: 0 10*3/uL (ref 0.0–0.2)
EOS (ABSOLUTE): 0.3 10*3/uL (ref 0.0–0.4)
EOS: 4 %
FERRITIN: 744 ng/mL — AB (ref 15–150)
HEMATOCRIT: 37.7 % (ref 34.0–46.6)
HEMOGLOBIN: 12.6 g/dL (ref 11.1–15.9)
IRON SATURATION: 33 % (ref 15–55)
Immature Grans (Abs): 0 10*3/uL (ref 0.0–0.1)
Immature Granulocytes: 0 %
Iron: 77 ug/dL (ref 27–139)
LYMPHS ABS: 1.6 10*3/uL (ref 0.7–3.1)
LYMPHS: 28 %
MCH: 33.4 pg — AB (ref 26.6–33.0)
MCHC: 33.4 g/dL (ref 31.5–35.7)
MCV: 100 fL — ABNORMAL HIGH (ref 79–97)
MONOS ABS: 0.7 10*3/uL (ref 0.1–0.9)
Monocytes: 13 %
NEUTROS ABS: 3.1 10*3/uL (ref 1.4–7.0)
Neutrophils: 54 %
Platelets: 257 10*3/uL (ref 150–379)
RBC: 3.77 x10E6/uL (ref 3.77–5.28)
RDW: 14.4 % (ref 12.3–15.4)
Retic Ct Pct: 1.4 % (ref 0.6–2.6)
Total Iron Binding Capacity: 230 ug/dL — ABNORMAL LOW (ref 250–450)
UIBC: 153 ug/dL (ref 118–369)
WBC: 5.8 10*3/uL (ref 3.4–10.8)

## 2016-07-15 LAB — THYROID PANEL WITH TSH
FREE THYROXINE INDEX: 0.8 — AB (ref 1.2–4.9)
T3 UPTAKE RATIO: 25 % (ref 24–39)
T4 TOTAL: 3 ug/dL — AB (ref 4.5–12.0)
TSH: 3.84 u[IU]/mL (ref 0.450–4.500)

## 2016-07-16 ENCOUNTER — Telehealth: Payer: Self-pay | Admitting: *Deleted

## 2016-07-16 ENCOUNTER — Telehealth: Payer: Self-pay | Admitting: Nurse Practitioner

## 2016-07-16 ENCOUNTER — Other Ambulatory Visit: Payer: Self-pay | Admitting: Nurse Practitioner

## 2016-07-16 DIAGNOSIS — R7989 Other specified abnormal findings of blood chemistry: Secondary | ICD-10-CM

## 2016-07-16 NOTE — Telephone Encounter (Signed)
Call from pt reporting ferritin was checked by PCP and is high. Was told she will need phlebotomy to decrease ferritin. Told pt it is likely related to recent Infed infusion and should level out.  Pt requests Dr. Benay Spice review lab and weigh in.

## 2016-07-16 NOTE — Telephone Encounter (Signed)
ok 

## 2016-07-16 NOTE — Telephone Encounter (Signed)
Per Dr. Benay Spice: Ferritin is elevated due to iron infusion. Follow up as scheduled. Pt made aware, voiced appreciation for return call.

## 2016-07-17 ENCOUNTER — Ambulatory Visit: Payer: PPO | Admitting: Oncology

## 2016-07-17 ENCOUNTER — Other Ambulatory Visit: Payer: PPO

## 2016-08-04 ENCOUNTER — Other Ambulatory Visit: Payer: Self-pay | Admitting: Gastroenterology

## 2016-08-06 ENCOUNTER — Encounter (HOSPITAL_COMMUNITY): Payer: Self-pay | Admitting: *Deleted

## 2016-08-06 NOTE — Progress Notes (Signed)
Spoke with pt for pre-op call. Pt denies cardiac history, chest pain or sob. Pt is not diabetic. 

## 2016-08-07 ENCOUNTER — Other Ambulatory Visit: Payer: Self-pay | Admitting: Gastroenterology

## 2016-08-07 ENCOUNTER — Encounter (HOSPITAL_COMMUNITY)
Admission: RE | Disposition: A | Discharge status: Home / Self Care | Payer: Self-pay | Source: Ambulatory Visit | Attending: Gastroenterology

## 2016-08-07 ENCOUNTER — Encounter (HOSPITAL_COMMUNITY): Payer: Self-pay

## 2016-08-07 ENCOUNTER — Ambulatory Visit (HOSPITAL_COMMUNITY): Payer: PPO | Admitting: Anesthesiology

## 2016-08-07 ENCOUNTER — Ambulatory Visit (HOSPITAL_COMMUNITY)
Admission: RE | Admit: 2016-08-07 | Discharge: 2016-08-07 | Disposition: A | Discharge status: Home / Self Care | Payer: PPO | Source: Ambulatory Visit | Attending: Gastroenterology | Admitting: Gastroenterology

## 2016-08-07 DIAGNOSIS — D509 Iron deficiency anemia, unspecified: Secondary | ICD-10-CM | POA: Diagnosis not present

## 2016-08-07 DIAGNOSIS — K222 Esophageal obstruction: Secondary | ICD-10-CM | POA: Diagnosis not present

## 2016-08-07 DIAGNOSIS — Z980 Intestinal bypass and anastomosis status: Secondary | ICD-10-CM | POA: Insufficient documentation

## 2016-08-07 DIAGNOSIS — K311 Adult hypertrophic pyloric stenosis: Secondary | ICD-10-CM | POA: Diagnosis not present

## 2016-08-07 DIAGNOSIS — K449 Diaphragmatic hernia without obstruction or gangrene: Secondary | ICD-10-CM | POA: Insufficient documentation

## 2016-08-07 DIAGNOSIS — T182XXA Foreign body in stomach, initial encounter: Secondary | ICD-10-CM | POA: Diagnosis not present

## 2016-08-07 DIAGNOSIS — K219 Gastro-esophageal reflux disease without esophagitis: Secondary | ICD-10-CM | POA: Diagnosis not present

## 2016-08-07 DIAGNOSIS — R011 Cardiac murmur, unspecified: Secondary | ICD-10-CM | POA: Diagnosis not present

## 2016-08-07 HISTORY — DX: Personal history of urinary calculi: Z87.442

## 2016-08-07 HISTORY — DX: Gastro-esophageal reflux disease without esophagitis: K21.9

## 2016-08-07 HISTORY — DX: Unspecified osteoarthritis, unspecified site: M19.90

## 2016-08-07 HISTORY — DX: Iron deficiency: E61.1

## 2016-08-07 HISTORY — PX: ESOPHAGOGASTRODUODENOSCOPY (EGD) WITH PROPOFOL: SHX5813

## 2016-08-07 HISTORY — PX: BALLOON DILATION: SHX5330

## 2016-08-07 SURGERY — ESOPHAGOGASTRODUODENOSCOPY (EGD) WITH PROPOFOL
Anesthesia: Monitor Anesthesia Care

## 2016-08-07 MED ORDER — PROPOFOL 10 MG/ML IV BOLUS
INTRAVENOUS | Status: DC | PRN
  Administered 2016-08-07 (×3): 20 mg via INTRAVENOUS
  Administered 2016-08-07: 10 mg via INTRAVENOUS

## 2016-08-07 MED ORDER — LACTATED RINGERS IV SOLN
INTRAVENOUS | Status: DC
  Administered 2016-08-07: 1000 mL via INTRAVENOUS

## 2016-08-07 MED ORDER — PROPOFOL 500 MG/50ML IV EMUL
INTRAVENOUS | Status: DC | PRN
  Administered 2016-08-07: 100 ug/kg/min via INTRAVENOUS

## 2016-08-07 MED ORDER — SODIUM CHLORIDE 0.9 % IV SOLN
INTRAVENOUS | Status: DC
  Administered 2016-08-07: 09:00:00 via INTRAVENOUS

## 2016-08-07 NOTE — Anesthesia Preprocedure Evaluation (Addendum)
Anesthesia Evaluation  Patient identified by MRN, date of birth, ID band  Reviewed: Allergy & Precautions, NPO status , Patient's Chart, lab work & pertinent test results  Airway Mallampati: II  TM Distance: >3 FB Neck ROM: Full    Dental  (+) Teeth Intact, Dental Advisory Given   Pulmonary    breath sounds clear to auscultation       Cardiovascular negative cardio ROS  + Valvular Problems/Murmurs  Rhythm:Regular     Neuro/Psych Anxiety    GI/Hepatic GERD  ,  Endo/Other  Hypothyroidism   Renal/GU Renal disease     Musculoskeletal   Abdominal   Peds  Hematology  (+) anemia ,   Anesthesia Other Findings   Reproductive/Obstetrics                           Anesthesia Physical Anesthesia Plan  ASA: III  Anesthesia Plan: MAC   Post-op Pain Management:    Induction: Intravenous  Airway Management Planned: Natural Airway, Nasal Cannula and Simple Face Mask  Additional Equipment: None  Intra-op Plan:   Post-operative Plan:   Informed Consent: I have reviewed the patients History and Physical, chart, labs and discussed the procedure including the risks, benefits and alternatives for the proposed anesthesia with the patient or authorized representative who has indicated his/her understanding and acceptance.   Dental advisory given  Plan Discussed with: CRNA and Surgeon  Anesthesia Plan Comments:         Anesthesia Quick Evaluation

## 2016-08-07 NOTE — Transfer of Care (Signed)
Immediate Anesthesia Transfer of Care Note  Patient: Brittany Hensley  Procedure(s) Performed: Procedure(s): ESOPHAGOGASTRODUODENOSCOPY (EGD) WITH PROPOFOL (N/A) BALLOON DILATION (N/A)  Patient Location: Endoscopy Unit  Anesthesia Type:MAC  Level of Consciousness: awake, alert  and oriented  Airway & Oxygen Therapy: Patient Spontanous Breathing  Post-op Assessment: Report given to RN and Post -op Vital signs reviewed and stable  Post vital signs: Reviewed  Last Vitals:  Vitals:   08/07/16 0726 08/07/16 0934  BP: (!) 109/58   Pulse: 73   Resp: 12 19  Temp: 36.6 C 36.8 C    Last Pain:  Vitals:   08/07/16 0934  TempSrc: Oral         Complications: No apparent anesthesia complications

## 2016-08-07 NOTE — Op Note (Signed)
[facility] Patient Name: Brittany Hensley Procedure Date : 08/07/2016 MRN: [id number] Attending MD: Clarene Essex , MD Date of Birth: 03-23-1951 CSN: [id number] Age: 66 Admit Type: Outpatient Procedure:                Upper GI endoscopy Indications:              Post-surgical anastomotic stenosis Providers:                Clarene Essex, MD, Carolynn Comment, RN, Alfonso Patten,                            Technician, Ivar Drape CRNA, CRNA Referring MD:              Medicines:                Monitored Anesthesia Care Complications:            No immediate complications. Estimated Blood Loss:     Estimated blood loss: none. Procedure:                Pre-Anesthesia Assessment:                           - Prior to the procedure, a History and Physical                            was performed, and patient medications and                            allergies were reviewed. The patient's tolerance of                            previous anesthesia was also reviewed. The risks                            and benefits of the procedure and the sedation                            options and risks were discussed with the patient.                            All questions were answered, and informed consent                            was obtained. Prior Anticoagulants: The patient has                            taken no previous anticoagulant or antiplatelet                            agents. ASA Grade Assessment: II - A patient with                            mild systemic disease. After reviewing the risks  and benefits, the patient was deemed in                            satisfactory condition to undergo the procedure.                           After obtaining informed consent, the endoscope was                            passed under direct vision. Throughout the                            procedure, the patient's blood pressure, pulse, and                             oxygen saturations were monitored continuously. The                            EG-[id number]) scope was introduced through the                            mouth, with the intention of advancing to the                            stomach. The scope was advanced to the gastric body                            before the procedure was aborted. Medications were                            given. The upper GI endoscopy was performed with                            difficulty due to presence of food. attempts at                            Successful completion of the procedure was aided by                            lavage. The patient tolerated the procedure well                            until she started vomiting and we elected to stop                            the procedure at that point. we could see her                            anastomosis underneath the food but were unable to                            find the stenosis despite a prolonged look and we  elevated the head of her bed to try to change the                            positioning of the food and unfortunately she                            started vomiting at that point so the procedure was                            terminated Scope In: Scope Out: Findings:      A small hiatal hernia was present.      A large amount of food (residue) was found in the gastric body.      Evidence of a Roux-en-Y gastrojejunostomy was found. The gastrojejunal       anastomosis was characterized by erythema, friable mucosa, inflammation,       stenosis and ulceration. This could not be traversed. we could not find       the anastomosis opening as above Impression:               - Small hiatal hernia.                           - A large amount of food (residue) in the stomach.                           - Roux-en-Y gastrojejunostomy with gastrojejunal                            anastomosis characterized by erythema,  friable                            mucosa, inflammation, ulceration and stenosis.                           - No specimens collected. Moderate Sedation:      moderate sedation-none Recommendation:           - Patient has a contact number available for                            emergencies. The signs and symptoms of potential                            delayed complications were discussed with the                            patient. Return to normal activities tomorrow.                            Written discharge instructions were provided to the                            patient.                           - Clear liquid diet  for 3 days.                           - Repeat upper endoscopy in 4 days for retreatment.                           - Return to GI clinic PRN.                           - Telephone GI clinic if symptomatic PRN.                           - Continue present medications. Procedure Code(s):        --- Professional ---                           [phone], 64, Esophagogastroduodenoscopy, flexible,                            transoral; diagnostic, including collection of                            specimen(s) by brushing or washing, when performed                            (separate procedure) Diagnosis Code(s):        --- Professional ---                           K44.9, Diaphragmatic hernia without obstruction or                            gangrene                           Z98.0, Intestinal bypass and anastomosis status                           K91.89, Other postprocedural complications and                            disorders of digestive system CPT copyright 2016 American [employer]. All rights reserved. The codes documented in this report are preliminary and upon coder review may  be revised to meet current compliance requirements. Clarene Essex, MD 08/07/2016 9:49:49 AM This report has been signed electronically. Number of Addenda: 0

## 2016-08-07 NOTE — Discharge Instructions (Signed)
Esophagogastroduodenoscopy, Care After Introduction Refer to this sheet in the next few weeks. These instructions provide you with information about caring for yourself after your procedure. Your health care provider may also give you more specific instructions. Your treatment has been planned according to current medical practices, but problems sometimes occur. Call your health care provider if you have any problems or questions after your procedure. What can I expect after the procedure? After the procedure, it is common to have:  A sore throat.  Nausea.  Bloating.  Dizziness.  Fatigue. Follow these instructions at home:  Do not eat or drink anything until the numbing medicine (local anesthetic) has worn off and your gag reflex has returned. You will know that the local anesthetic has worn off when you can swallow comfortably.  Do not drive for 24 hours if you received a medicine to help you relax (sedative).  If your health care provider took a tissue sample for testing during the procedure, make sure to get your test results. This is your responsibility. Ask your health care provider or the department performing the test when your results will be ready.  Keep all follow-up visits as told by your health care provider. This is important. Contact a health care provider if:  You cannot stop coughing.  You are not urinating.  You are urinating less than usual. Get help right away if:  You have trouble swallowing.  You cannot eat or drink.  You have throat or chest pain that gets worse.  You are dizzy or light-headed.  You faint.  You have nausea or vomiting.  You have chills.  You have a fever.  You have severe abdominal pain.  You have black, tarry, or bloody stools. This information is not intended to replace advice given to you by your health care provider. Make sure you discuss any questions you have with your health care provider. Document Released: 07/06/2012  Document Revised: 12/26/2015 Document Reviewed: 06/13/2015  2017 Elsevier Call if question or problem otherwise stay on liquids only this weekend and clear liquids only on Sunday and nothing after midnight Monday night and repeat attempt at dilation Monday at [facility] at 2:15 and please arrive at 1 PM

## 2016-08-07 NOTE — Progress Notes (Signed)
Brittany Hensley 9:10 AM  Subjective: Patient with recurrence of her anastomotic stricture with no other medical problems or any complaints since I seen her in the office  Objective: Vital signs stable afebrile no acute distress exam please see preassessment evaluation recent labs reviewed  Assessment: Recurrent anastomotic stricture and ulceration  Plan: Okay to proceed with EGD and balloon dilation with anesthesia assistance  [facility] E  Pager [phone] After 5PM or if no answer call [phone]

## 2016-08-10 ENCOUNTER — Ambulatory Visit (HOSPITAL_COMMUNITY)
Admission: RE | Admit: 2016-08-10 | Discharge: 2016-08-10 | Disposition: A | Discharge status: Home / Self Care | Payer: PPO | Source: Ambulatory Visit | Attending: Gastroenterology | Admitting: Gastroenterology

## 2016-08-10 ENCOUNTER — Ambulatory Visit (HOSPITAL_COMMUNITY): Payer: PPO | Admitting: Anesthesiology

## 2016-08-10 ENCOUNTER — Encounter (HOSPITAL_COMMUNITY): Payer: Self-pay | Admitting: *Deleted

## 2016-08-10 ENCOUNTER — Encounter (HOSPITAL_COMMUNITY)
Admission: RE | Disposition: A | Discharge status: Home / Self Care | Payer: Self-pay | Source: Ambulatory Visit | Attending: Gastroenterology

## 2016-08-10 DIAGNOSIS — E039 Hypothyroidism, unspecified: Secondary | ICD-10-CM | POA: Insufficient documentation

## 2016-08-10 DIAGNOSIS — Z79899 Other long term (current) drug therapy: Secondary | ICD-10-CM | POA: Insufficient documentation

## 2016-08-10 DIAGNOSIS — K311 Adult hypertrophic pyloric stenosis: Secondary | ICD-10-CM | POA: Diagnosis not present

## 2016-08-10 DIAGNOSIS — K449 Diaphragmatic hernia without obstruction or gangrene: Secondary | ICD-10-CM | POA: Insufficient documentation

## 2016-08-10 DIAGNOSIS — Z980 Intestinal bypass and anastomosis status: Secondary | ICD-10-CM | POA: Diagnosis not present

## 2016-08-10 DIAGNOSIS — K9189 Other postprocedural complications and disorders of digestive system: Secondary | ICD-10-CM | POA: Diagnosis not present

## 2016-08-10 DIAGNOSIS — R1311 Dysphagia, oral phase: Secondary | ICD-10-CM | POA: Diagnosis not present

## 2016-08-10 DIAGNOSIS — F419 Anxiety disorder, unspecified: Secondary | ICD-10-CM | POA: Insufficient documentation

## 2016-08-10 DIAGNOSIS — K219 Gastro-esophageal reflux disease without esophagitis: Secondary | ICD-10-CM | POA: Insufficient documentation

## 2016-08-10 DIAGNOSIS — R1310 Dysphagia, unspecified: Secondary | ICD-10-CM | POA: Diagnosis not present

## 2016-08-10 DIAGNOSIS — D509 Iron deficiency anemia, unspecified: Secondary | ICD-10-CM | POA: Diagnosis not present

## 2016-08-10 HISTORY — PX: ESOPHAGOGASTRODUODENOSCOPY: SHX5428

## 2016-08-10 HISTORY — PX: BALLOON DILATION: SHX5330

## 2016-08-10 SURGERY — EGD (ESOPHAGOGASTRODUODENOSCOPY)
Anesthesia: Monitor Anesthesia Care

## 2016-08-10 MED ORDER — BUTAMBEN-TETRACAINE-BENZOCAINE 2-2-14 % EX AERO
INHALATION_SPRAY | CUTANEOUS | Status: DC | PRN
  Administered 2016-08-10: 2 via TOPICAL

## 2016-08-10 MED ORDER — PROPOFOL 10 MG/ML IV BOLUS
INTRAVENOUS | Status: DC | PRN
  Administered 2016-08-10 (×3): 15 mg via INTRAVENOUS

## 2016-08-10 MED ORDER — SODIUM CHLORIDE 0.9 % IV SOLN: INTRAVENOUS | Status: DC

## 2016-08-10 MED ORDER — LACTATED RINGERS IV SOLN
INTRAVENOUS | Status: DC
  Administered 2016-08-10: 1000 mL via INTRAVENOUS

## 2016-08-10 MED ORDER — SODIUM CHLORIDE 0.9 % IV SOLN
INTRAVENOUS | Status: DC | PRN
  Administered 2016-08-10: 14:00:00 via INTRAVENOUS

## 2016-08-10 MED ORDER — LIDOCAINE HCL (CARDIAC) 20 MG/ML IV SOLN
INTRAVENOUS | Status: DC | PRN
  Administered 2016-08-10: 50 mg via INTRATRACHEAL

## 2016-08-10 MED ORDER — PROPOFOL 500 MG/50ML IV EMUL
INTRAVENOUS | Status: DC | PRN
  Administered 2016-08-10: 125 ug/kg/min via INTRAVENOUS

## 2016-08-10 NOTE — Anesthesia Postprocedure Evaluation (Signed)
Anesthesia Post Note  Patient: Shawnee Gambone Fujimoto  Procedure(s) Performed: Procedure(s) (LRB): ESOPHAGOGASTRODUODENOSCOPY (EGD) (N/A) BALLOON DILATION (N/A)  Patient location during evaluation: PACU Anesthesia Type: MAC Level of consciousness: awake and alert Pain management: pain level controlled Vital Signs Assessment: post-procedure vital signs reviewed and stable Respiratory status: spontaneous breathing, nonlabored ventilation and respiratory function stable Cardiovascular status: stable and blood pressure returned to baseline Anesthetic complications: no       Last Vitals:  Vitals:   08/10/16 1519 08/10/16 1540  BP: (!) 103/56 113/65  Pulse: 78   Resp: 15   Temp: 36.6 C     Last Pain:  Vitals:   08/10/16 1519  TempSrc: Oral                 Jahmani Staup,W. EDMOND

## 2016-08-10 NOTE — Transfer of Care (Signed)
Immediate Anesthesia Transfer of Care Note  Patient: Brittany Hensley  Procedure(s) Performed: Procedure(s): ESOPHAGOGASTRODUODENOSCOPY (EGD) (N/A) BALLOON DILATION (N/A)  Patient Location: Endoscopy Unit  Anesthesia Type:MAC  Level of Consciousness: awake, alert , oriented and patient cooperative  Airway & Oxygen Therapy: Patient Spontanous Breathing and Patient connected to nasal cannula oxygen  Post-op Assessment: Report given to RN and Post -op Vital signs reviewed and stable  Post vital signs: Reviewed and stable  Last Vitals:  Vitals:   08/10/16 1519 08/10/16 1540  BP: (!) 103/56 113/65  Pulse: 78   Resp: 15   Temp: 36.6 C     Last Pain:  Vitals:   08/10/16 1519  TempSrc: Oral         Complications: No apparent anesthesia complications

## 2016-08-10 NOTE — Progress Notes (Signed)
Brittany Hensley 2:23 PM  Subjective: Patient without any problems this weekend since we last saw her on Friday and no new complaints and case discussed with her brother as well  Objective: Vital signs stable afebrile no acute distress exam please see preassessment evaluation  Assessment: Gastric outlet obstruction secondary to anastomotic stricture  Plan: Okay to proceed with endoscopy and hopefully balloon dilation with anesthesia assistance  [facility] E  Pager [phone] After 5PM or if no answer call [phone]

## 2016-08-10 NOTE — Anesthesia Preprocedure Evaluation (Addendum)
Anesthesia Evaluation  Patient identified by MRN, date of birth, ID band Patient awake    Reviewed: Allergy & Precautions, NPO status , Patient's Chart, lab work & pertinent test results  Airway Mallampati: II  TM Distance: >3 FB Neck ROM: Full    Dental  (+) Teeth Intact, Dental Advisory Given, Caps   Pulmonary neg pulmonary ROS,    breath sounds clear to auscultation       Cardiovascular  Rhythm:Regular Rate:Normal     Neuro/Psych Anxiety    GI/Hepatic GERD  ,  Endo/Other  Hypothyroidism   Renal/GU      Musculoskeletal  (+) Arthritis ,   Abdominal (+) - obese,   Peds  Hematology  (+) anemia ,   Anesthesia Other Findings   Reproductive/Obstetrics                            Anesthesia Physical Anesthesia Plan  ASA: III  Anesthesia Plan: MAC   Post-op Pain Management:    Induction: Intravenous  Airway Management Planned: Simple Face Mask and Natural Airway  Additional Equipment:   Intra-op Plan:   Post-operative Plan: Extubation in OR  Informed Consent: I have reviewed the patients History and Physical, chart, labs and discussed the procedure including the risks, benefits and alternatives for the proposed anesthesia with the patient or authorized representative who has indicated his/her understanding and acceptance.   Dental advisory given  Plan Discussed with:   Anesthesia Plan Comments:         Anesthesia Quick Evaluation

## 2016-08-10 NOTE — Op Note (Signed)
[facility] Patient Name: Brittany Hensley Procedure Date : 08/10/2016 MRN: [id number] Attending MD: Clarene Essex , MD Date of Birth: April 08, 1951 CSN: [id number] Age: 66 Admit Type: Outpatient Procedure:                Upper GI endoscopy Indications:              Esophageal reflux, For therapy of post-surgical                            anastomotic stenosis, Nausea with vomiting Providers:                Clarene Essex, MD, Cleda Daub, RN, Corliss Parish,                            Technician Referring MD:              Medicines:                Propofol total dose 155 mg IV,50 mg IV lidocaine Complications:            No immediate complications. Estimated Blood Loss:     Estimated blood loss: none. Procedure:                Pre-Anesthesia Assessment:                           - Prior to the procedure, a History and Physical                            was performed, and patient medications and                            allergies were reviewed. The patient's tolerance of                            previous anesthesia was also reviewed. The risks                            and benefits of the procedure and the sedation                            options and risks were discussed with the patient.                            All questions were answered, and informed consent                            was obtained. Prior Anticoagulants: The patient has                            taken no previous anticoagulant or antiplatelet                            agents. ASA Grade Assessment: II - A patient with  mild systemic disease. After reviewing the risks                            and benefits, the patient was deemed in                            satisfactory condition to undergo the procedure.                           After obtaining informed consent, the endoscope was                            passed under direct vision. Throughout the        procedure, the patient's blood pressure, pulse, and                            oxygen saturations were monitored continuously. The                            [id number]) scope was introduced through the                            mouth, and advanced to the jejunum. The upper GI                            endoscopy was accomplished without difficulty. The                            patient tolerated the procedure well. Scope In: Scope Out: Findings:      A small hiatal hernia was present.      Evidence of a gastrojejunostomy was found in the gastric body. This was       characterized by erythema, friable mucosa, inflammation and mild       stenosis. were able to advance the scope through the anastomosisbut       decided to dilate as well and A TTS dilator was passed through the       scope. Dilation with a 12-13.5-15 mm colonic balloon dilator was       performed using all 3 sizes without any complication or problem and       minimal heme. so we proceeded with A TTS dilator was passed through the       scope. Dilation with a 15-16.5-18 mm balloon dilator was performed to 18       mm.      The examined jejunum was normal.      The exam was otherwise without abnormality. Impression:               - Small hiatal hernia.                           - A gastrojejunostomy was found, characterized by                            friable mucosa, inflammation, erythema and mild  stenosis. Dilated to 18 mm as above.                           - Normal examined jejunum.                           - The examination was otherwise normal.                           - No specimens collected. Moderate Sedation:      moderate sedation-none Recommendation:           - Patient has a contact number available for                            emergencies. The signs and symptoms of potential                            delayed complications were discussed with the                             patient. Return to normal activities tomorrow.                            Written discharge instructions were provided to the                            patient.                           - Full liquid diet for 4 hours.                           - Continue present medications.                           - Return to GI clinic in 3 months.                           - Telephone GI clinic if symptomatic PRN. Procedure Code(s):        --- Professional ---                           [phone], Esophagogastroduodenoscopy, flexible,                            transoral; with dilation of gastric/duodenal                            stricture(s) (eg, balloon, bougie) Diagnosis Code(s):        --- Professional ---                           K44.9, Diaphragmatic hernia without obstruction or                            gangrene  Z98.0, Intestinal bypass and anastomosis status                           K21.9, Gastro-esophageal reflux disease without                            esophagitis                           K91.89, Other postprocedural complications and                            disorders of digestive system                           R11.2, Nausea with vomiting, unspecified CPT copyright 2016 American Medical Association. All rights reserved. The codes documented in this report are preliminary and upon coder review may  be revised to meet current compliance requirements. Clarene Essex, MD 08/10/2016 3:24:05 PM This report has been signed electronically. Number of Addenda: 0

## 2016-08-10 NOTE — Discharge Instructions (Addendum)
YOU HAD AN ENDOSCOPIC PROCEDURE TODAY: Refer to the procedure report and other information in the discharge instructions given to you for any specific questions about what was found during the examination. If this information does not answer your questions, please call [facility] office at [phone] to clarify.   YOU SHOULD EXPECT: Some feelings of bloating in the abdomen. Passage of more gas than usual. Walking can help get rid of the air that was put into your GI tract during the procedure and reduce the bloating.  Some abdominal soreness may be present for a day or two, also.  DIET: Your first meal following the procedure should be a light meal and then it is ok to progress to your normal diet. A half-sandwich or bowl of soup is an example of a good first meal. Heavy or fried foods are harder to digest and may make you feel nauseous or bloated. Drink plenty of fluids but you should avoid alcoholic beverages for 24 hours. If you had a esophageal dilation, please see attached instructions for diet.   ACTIVITY: Your care partner should take you home directly after the procedure. You should plan to take it easy, moving slowly for the rest of the day. You can resume normal activity the day after the procedure however YOU SHOULD NOT DRIVE, use power tools, machinery or perform tasks that involve climbing or major physical exertion for 24 hours (because of the sedation medicines used during the test).   SYMPTOMS TO REPORT IMMEDIATELY: A gastroenterologist can be reached at any hour. Please call [phone]  for any of the following symptoms:   Following upper endoscopy (EGD, EUS, ERCP, esophageal dilation) Vomiting of blood or coffee ground material  New, significant abdominal pain  New, significant chest pain or pain under the shoulder blades  Painful or persistently difficult swallowing  New shortness of breath  Black, tarry-looking or red, bloody stools  FOLLOW UP:  If any biopsies were taken  you will be contacted by phone or by letter within the next 1-3 weeks. Call [phone]  if you have not heard about the biopsies in 3 weeks.  Please also call with any specific questions about appointments or follow up tests. Call if question or problem or if symptoms worsen otherwise follow-up in 3 months and liquids only for 4 hours and slowly advance as tolerated later today and in the future clear liquids only for 2 days prior to dilation

## 2016-08-12 ENCOUNTER — Encounter (HOSPITAL_COMMUNITY): Payer: Self-pay | Admitting: Gastroenterology

## 2016-08-12 NOTE — Anesthesia Postprocedure Evaluation (Addendum)
Anesthesia Post Note  Patient: Brittany Hensley  Procedure(s) Performed: Procedure(s) (LRB): ESOPHAGOGASTRODUODENOSCOPY (EGD) WITH PROPOFOL (N/A) BALLOON DILATION (N/A)  Patient location during evaluation: Endoscopy Anesthesia Type: MAC Level of consciousness: awake and alert Pain management: pain level controlled Vital Signs Assessment: post-procedure vital signs reviewed and stable Respiratory status: spontaneous breathing, nonlabored ventilation, respiratory function stable and patient connected to nasal cannula oxygen Cardiovascular status: stable and blood pressure returned to baseline Anesthetic complications: no       Last Vitals:  Vitals:   08/07/16 1010 08/07/16 1017  BP:  131/60  Pulse: 79 77  Resp: 14 14  Temp:      Last Pain:  Vitals:   08/07/16 0934  TempSrc: Oral                 Breylan Lefevers

## 2016-08-17 ENCOUNTER — Telehealth: Payer: Self-pay | Admitting: Oncology

## 2016-08-17 ENCOUNTER — Ambulatory Visit (HOSPITAL_BASED_OUTPATIENT_CLINIC_OR_DEPARTMENT_OTHER): Payer: PPO | Admitting: Oncology

## 2016-08-17 ENCOUNTER — Other Ambulatory Visit (HOSPITAL_BASED_OUTPATIENT_CLINIC_OR_DEPARTMENT_OTHER): Payer: PPO

## 2016-08-17 VITALS — BP 101/48 | HR 68 | Temp 98.1°F | Resp 16 | Ht 65.0 in | Wt 99.8 lb

## 2016-08-17 DIAGNOSIS — K922 Gastrointestinal hemorrhage, unspecified: Secondary | ICD-10-CM | POA: Diagnosis not present

## 2016-08-17 DIAGNOSIS — D508 Other iron deficiency anemias: Secondary | ICD-10-CM

## 2016-08-17 DIAGNOSIS — D500 Iron deficiency anemia secondary to blood loss (chronic): Secondary | ICD-10-CM | POA: Diagnosis not present

## 2016-08-17 DIAGNOSIS — D509 Iron deficiency anemia, unspecified: Secondary | ICD-10-CM

## 2016-08-17 LAB — CBC WITH DIFFERENTIAL/PLATELET
BASO%: 0.7 % (ref 0.0–2.0)
Basophils Absolute: 0 10*3/uL (ref 0.0–0.1)
EOS%: 4.2 % (ref 0.0–7.0)
Eosinophils Absolute: 0.2 10*3/uL (ref 0.0–0.5)
HCT: 39 % (ref 34.8–46.6)
HGB: 12.7 g/dL (ref 11.6–15.9)
LYMPH%: 28.4 % (ref 14.0–49.7)
MCH: 33.4 pg (ref 25.1–34.0)
MCHC: 32.5 g/dL (ref 31.5–36.0)
MCV: 102.7 fL — ABNORMAL HIGH (ref 79.5–101.0)
MONO#: 0.5 10*3/uL (ref 0.1–0.9)
MONO%: 11.1 % (ref 0.0–14.0)
NEUT%: 55.6 % (ref 38.4–76.8)
NEUTROS ABS: 2.7 10*3/uL (ref 1.5–6.5)
PLATELETS: 244 10*3/uL (ref 145–400)
RBC: 3.79 10*6/uL (ref 3.70–5.45)
RDW: 14.7 % — ABNORMAL HIGH (ref 11.2–14.5)
WBC: 4.8 10*3/uL (ref 3.9–10.3)
lymph#: 1.4 10*3/uL (ref 0.9–3.3)

## 2016-08-17 LAB — FERRITIN: Ferritin: 295 ng/ml — ABNORMAL HIGH (ref 9–269)

## 2016-08-17 NOTE — Progress Notes (Signed)
  [facility] OFFICE PROGRESS NOTE   Diagnosis: Anemia  INTERVAL HISTORY:   [name] returns as scheduled. She underwent an upper endoscopy and dilatation of the gastrojejunostomy on 08/10/2016. She last received IV iron on 07/01/2016. She reports improvement in dysphagia after dilatation procedure. She feels well.  Objective:  Vital signs in last 24 hours:  Blood pressure (!) 101/48, pulse 68, temperature 98.1 F (36.7 C), temperature source Oral, resp. rate 16, height 5\' 5"  (1.651 m), weight 99 lb 12.8 oz (45.3 kg), SpO2 99 %.    Resp: Lungs clear bilaterally Cardio: Regular rate and rhythm GI: No hepatomegaly, nontender, no mass Vascular: No leg edema   Lab Results:  Lab Results  Component Value Date   WBC 4.8 08/17/2016   HGB 12.7 08/17/2016   HCT 39.0 08/17/2016   MCV 102.7 (H) 08/17/2016   PLT 244 08/17/2016   NEUTROABS 2.7 08/17/2016    Medications: I have reviewed the patient's current medications.  Assessment/Plan: 1. Chronic anemia secondary to gastrointestinal blood loss and iron deficiency, she last received IV iron 07/01/2016 2. History of kidney stones, followed by Dr. Roni Bread. 3. Gram-negative sepsis and septic shock syndrome April 2008. 4. History of acute renal failure secondary to obstruction and septic shock. 5. History of adrenal insufficiency, no longer on hormone replacement.  6. Questionable allergic to Venofer in June 2010. She reported an elevated blood pressure and "dizziness" on the day following the Venofer therapy. She has tolerated iron dextran without an apparent reaction. 7. History of a left forearm fracture.  8. Intermittent low-serum bicarbonate level, ? related to diarrhea or renal insufficiency 9. Dysphagia, improved with erythromycin and esophageal dilatation procedures , status post dilatation of the gastric jejunal anastomosis 08/10/2014,02/15/2015, 11/12/2015, And 08/10/2016    Disposition:  Brittany Hensley  appears stable. The hemoglobin is in the normal range. She will return for a CBC and ferritin level in 3 months and 6 months. We will administer IV iron as indicated. She is scheduled for an office visit in 9 months.  15 minutes were spent with the patient today. The majority of the time was used for counseling and coordination of care.  Betsy Coder, MD  08/17/2016  2:28 PM

## 2016-08-17 NOTE — Telephone Encounter (Signed)
Appointments scheduled per 08/17/16 los. Patient was given a copy of the AVS report and appointment schedule, per 08/17/16 los.  °

## 2016-08-18 ENCOUNTER — Telehealth: Payer: Self-pay | Admitting: *Deleted

## 2016-08-18 NOTE — Telephone Encounter (Signed)
-----   Message from Ladell Pier, MD sent at 08/17/2016  8:35 PM EST ----- Please call patient, ferritin elevated, f/u as scheduled for lab in 3 months

## 2016-08-18 NOTE — Telephone Encounter (Signed)
Called pt with ferritin result. She voiced understanding.

## 2016-09-05 ENCOUNTER — Other Ambulatory Visit: Payer: Self-pay | Admitting: Nurse Practitioner

## 2016-09-07 NOTE — Telephone Encounter (Signed)
Called to the drug store

## 2016-09-07 NOTE — Telephone Encounter (Signed)
Please call in alpraolam with 1 refills 

## 2016-09-08 ENCOUNTER — Other Ambulatory Visit: Payer: Self-pay | Admitting: Nurse Practitioner

## 2016-10-30 ENCOUNTER — Other Ambulatory Visit: Payer: Self-pay | Admitting: Nurse Practitioner

## 2016-11-03 NOTE — Telephone Encounter (Signed)
rx called into pharmacy

## 2016-11-03 NOTE — Telephone Encounter (Signed)
Please call in xanax with 1 refills 

## 2016-11-19 ENCOUNTER — Other Ambulatory Visit (HOSPITAL_BASED_OUTPATIENT_CLINIC_OR_DEPARTMENT_OTHER): Payer: PPO

## 2016-11-19 DIAGNOSIS — D509 Iron deficiency anemia, unspecified: Secondary | ICD-10-CM

## 2016-11-19 DIAGNOSIS — D508 Other iron deficiency anemias: Secondary | ICD-10-CM

## 2016-11-19 LAB — CBC WITH DIFFERENTIAL/PLATELET
BASO%: 1 % (ref 0.0–2.0)
Basophils Absolute: 0 10*3/uL (ref 0.0–0.1)
EOS%: 4.8 % (ref 0.0–7.0)
Eosinophils Absolute: 0.2 10*3/uL (ref 0.0–0.5)
HCT: 39.5 % (ref 34.8–46.6)
HEMOGLOBIN: 13.1 g/dL (ref 11.6–15.9)
LYMPH%: 35.3 % (ref 14.0–49.7)
MCH: 34.2 pg — ABNORMAL HIGH (ref 25.1–34.0)
MCHC: 33.2 g/dL (ref 31.5–36.0)
MCV: 102.8 fL — ABNORMAL HIGH (ref 79.5–101.0)
MONO#: 0.7 10*3/uL (ref 0.1–0.9)
MONO%: 14.3 % — AB (ref 0.0–14.0)
NEUT%: 44.6 % (ref 38.4–76.8)
NEUTROS ABS: 2.2 10*3/uL (ref 1.5–6.5)
Platelets: 247 10*3/uL (ref 145–400)
RBC: 3.84 10*6/uL (ref 3.70–5.45)
RDW: 14.1 % (ref 11.2–14.5)
WBC: 5 10*3/uL (ref 3.9–10.3)
lymph#: 1.7 10*3/uL (ref 0.9–3.3)

## 2016-11-19 LAB — FERRITIN: FERRITIN: 161 ng/mL (ref 9–269)

## 2016-11-20 ENCOUNTER — Telehealth: Payer: Self-pay | Admitting: *Deleted

## 2016-11-20 NOTE — Telephone Encounter (Signed)
-----   Message from Ladell Pier, MD sent at 11/19/2016  9:39 PM EDT ----- Please call patient, ferritin and hb are stable, f/u as scheduled

## 2016-11-20 NOTE — Telephone Encounter (Signed)
Pt notified of ferritin and HGB result. She voiced understanding.

## 2016-12-30 ENCOUNTER — Other Ambulatory Visit: Payer: Self-pay | Admitting: Nurse Practitioner

## 2016-12-31 NOTE — Telephone Encounter (Signed)
Rx phoned into pharmacy.

## 2016-12-31 NOTE — Telephone Encounter (Signed)
Please call in alprazolam with 0 refills Last refill without being seen  

## 2017-01-04 NOTE — Addendum Note (Signed)
Addendum  created 01/04/17 0953 by Oleta Mouse, MD   Sign clinical note

## 2017-01-21 ENCOUNTER — Telehealth: Payer: Self-pay | Admitting: Nurse Practitioner

## 2017-01-21 NOTE — Telephone Encounter (Signed)
Patient has an appt with you next week for follow up. She has a lot of stress in her life right now. She has a rental house that the renter has stopped paying rent on and the bank is threatening foreclosure, her daughter in law just went in to a treatment facility for a drug addiction, and she is looking at some very expensive dental work by a Psychologist, sport and exercise and her anxiety level is high. She is already rx'd xanax and wants to know if its ok to take an extra one in the morning only if she needs it. She wanted to clear it with you first because she didn't want it to look like she wasn't taking her medications as prescribed. Please advise and route to pool B

## 2017-01-22 NOTE — Telephone Encounter (Signed)
Pt aware.

## 2017-01-22 NOTE — Telephone Encounter (Signed)
Must take as rx until seen

## 2017-01-27 ENCOUNTER — Other Ambulatory Visit: Payer: Self-pay | Admitting: Nurse Practitioner

## 2017-01-28 ENCOUNTER — Encounter: Payer: Self-pay | Admitting: Nurse Practitioner

## 2017-01-28 ENCOUNTER — Ambulatory Visit (INDEPENDENT_AMBULATORY_CARE_PROVIDER_SITE_OTHER): Payer: PPO | Admitting: Nurse Practitioner

## 2017-01-28 VITALS — BP 116/65 | HR 79 | Temp 97.0°F | Ht 66.0 in | Wt 99.8 lb

## 2017-01-28 DIAGNOSIS — D508 Other iron deficiency anemias: Secondary | ICD-10-CM | POA: Diagnosis not present

## 2017-01-28 DIAGNOSIS — L719 Rosacea, unspecified: Secondary | ICD-10-CM | POA: Diagnosis not present

## 2017-01-28 DIAGNOSIS — F411 Generalized anxiety disorder: Secondary | ICD-10-CM | POA: Diagnosis not present

## 2017-01-28 DIAGNOSIS — E034 Atrophy of thyroid (acquired): Secondary | ICD-10-CM | POA: Diagnosis not present

## 2017-01-28 DIAGNOSIS — Z681 Body mass index (BMI) 19 or less, adult: Secondary | ICD-10-CM

## 2017-01-28 DIAGNOSIS — K219 Gastro-esophageal reflux disease without esophagitis: Secondary | ICD-10-CM | POA: Diagnosis not present

## 2017-01-28 MED ORDER — ALPRAZOLAM 0.5 MG PO TABS: 0.5000 mg | ORAL_TABLET | Freq: Four times a day (QID) | ORAL | 1 refills | Status: DC

## 2017-01-28 MED ORDER — SERTRALINE HCL 100 MG PO TABS: 100.0000 mg | ORAL_TABLET | Freq: Every day | ORAL | 5 refills | Status: DC

## 2017-01-28 MED ORDER — OMEPRAZOLE 40 MG PO CPDR: DELAYED_RELEASE_CAPSULE | ORAL | 5 refills | Status: DC

## 2017-01-28 MED ORDER — METRONIDAZOLE 0.75 % EX GEL: Freq: Every day | CUTANEOUS | 5 refills | Status: DC

## 2017-01-28 NOTE — Addendum Note (Signed)
Addended by: Rolena Infante on: 01/28/2017 11:28 AM   Modules accepted: Orders

## 2017-01-28 NOTE — Progress Notes (Signed)
Subjective:    Patient ID: Brittany Hensley, female    DOB: 31-Jul-1951, 66 y.o.   MRN: [id number]  HPI Brittany Hensley is here today for follow up of chronic medical problem.  Outpatient Encounter Prescriptions as of 01/28/2017  Medication Sig  . ALPRAZolam (XANAX) 0.5 MG tablet TAKE ONE (1) TABLET THREE (3) TIMES EACH DAY  . CALCIUM CITRATE PO Take 600 mg by mouth 3 (three) times daily.  . fish oil-omega-3 fatty acids 1000 MG capsule Take 1 g by mouth every evening.   . lactobacillus acidophilus (BACID) TABS tablet Take 2 tablets by mouth daily.  Marland Kitchen levothyroxine (SYNTHROID, LEVOTHROID) 100 MCG tablet TAKE ONE TABLET EACH MORNING BEFORE BREAKFAST  . loratadine (CLARITIN) 10 MG tablet Take 10 mg by mouth 2 (two) times daily.    . magnesium gluconate (MAGONATE) 500 MG tablet Take 500 mg by mouth 2 (two) times daily.   . metroNIDAZOLE (METROGEL) 0.75 % gel APPLY AT BEDTIME  . Multiple Vitamin (MULTIVITAMIN) capsule Take 1 capsule by mouth 2 (two) times daily.    Marland Kitchen omeprazole (PRILOSEC) 40 MG capsule TAKE ONE (1) CAPSULE EACH DAY  . sertraline (ZOLOFT) 100 MG tablet Take 1 tablet (100 mg total) by mouth at bedtime.  . sucralfate (CARAFATE) 1 G tablet Take 1 tablet by mouth 3 (three) times daily.   . vitamin C (ASCORBIC ACID) 500 MG tablet Take 500 mg by mouth 2 (two) times daily.    . [DISCONTINUED] levothyroxine (SYNTHROID, LEVOTHROID) 100 MCG tablet    No facility-administered encounter medications on file as of 01/28/2017.     1. Gastroesophageal reflux disease without esophagitis  Patient takes omeprazole for symptom management.  2. Hypothyroidism due to acquired atrophy of thyroid  Patient managed with levothyroxine.  3. Rosacea   4. Other iron deficiency anemia  Patient not currently taking iron supplement.  Last IV iron treatment was in November 2017.  5. BMI less than 19,adult  No recent weight gain or loss.  6. GAD (generalized anxiety disorder) Patient taking Xanax and  Zoloft for management, but states she has been wanting to take more Xanax than usual due to increase in health concerns and change in living arrangement. GAD 7 : Generalized Anxiety Score 01/28/2017  Nervous, Anxious, on Edge 3  Control/stop worrying 3  Worry too much - different things 3  Trouble relaxing 2  Restless 2  Easily annoyed or irritable 2  Afraid - awful might happen 3  Total GAD 7 Score 18  Anxiety Difficulty Very difficult         New complaints:  Patient had a balloon dilatation in stomach in January 2018.   Review of Systems  Constitutional: Negative for activity change, appetite change and fatigue.  Respiratory: Negative for cough and shortness of breath.   Cardiovascular: Negative for chest pain and palpitations.  Gastrointestinal: Negative for abdominal distention and abdominal pain.  Neurological: Negative for dizziness, light-headedness and headaches.  Psychiatric/Behavioral:       Patient states increase in anxiety due to recent life events.  She states she feels "jittery" frequently and just cannot seem to calm down.  All other systems reviewed and are negative.      Objective:   Physical Exam  Constitutional: She is oriented to person, place, and time. She appears well-developed and well-nourished. No distress.  HENT:  Head: Normocephalic.  Right Ear: External ear normal.  Left Ear: External ear normal.  Mouth/Throat: Oropharynx is clear and moist.  Eyes:  Pupils are equal, round, and reactive to light.  Neck: Normal range of motion. Neck supple. No JVD present. No thyromegaly present.  Cardiovascular: Normal rate, regular rhythm, normal heart sounds and intact distal pulses.   No murmur heard. Pulmonary/Chest: Effort normal and breath sounds normal. No respiratory distress.  Abdominal: Soft. Bowel sounds are normal. She exhibits no distension. There is no tenderness.  Musculoskeletal: Normal range of motion.  Lymphadenopathy:    She has no  cervical adenopathy.  Neurological: She is alert and oriented to person, place, and time.  Skin: Skin is warm and dry.  Psychiatric: She has a normal mood and affect. Her behavior is normal. Judgment and thought content normal.   BP 116/65   Pulse 79   Temp 97 F (36.1 C)   Ht 5' 6" (1.676 m)   Wt 99 lb 12.8 oz (45.3 kg)   BMI 16.11 kg/m      Assessment & Plan:  1. Gastroesophageal reflux disease without esophagitis Avoid spicy foods Do not eat 2 hours prior to bedtime - omeprazole (PRILOSEC) 40 MG capsule; TAKE ONE (1) CAPSULE EACH DAY  Dispense: 30 capsule; Refill: 5  2. Hypothyroidism due to acquired atrophy of thyroid - CMP14+EGFR - Lipid panel - Thyroid Panel With TSH  3. Rosacea - metroNIDAZOLE (METROGEL) 0.75 % gel; Apply topically at bedtime.  Dispense: 45 g; Refill: 5  4. Other iron deficiency anemia - Anemia Profile B  5. BMI less than 19,adult Ensure daily- increase calories in diet  6. GAD (generalized anxiety disorder) Stress management Increased xanax to QID but only on temporary basis- do not take 4th dose unless absolutely necessary - sertraline (ZOLOFT) 100 MG tablet; Take 1 tablet (100 mg total) by mouth at bedtime.  Dispense: 30 tablet; Refill: 5 - ALPRAZolam (XANAX) 0.5 MG tablet; Take 1 tablet (0.5 mg total) by mouth 4 (four) times daily.  Dispense: 120 tablet; Refill: 1    Labs pending Health maintenance reviewed Diet and exercise encouraged Continue all meds Follow up  In 3 months   Mary-Margaret Martin, FNP   

## 2017-01-28 NOTE — Telephone Encounter (Signed)
Last seen 07/14/16  MMM  If approved route to nurse to call into [facility]

## 2017-01-28 NOTE — Patient Instructions (Signed)
Stress and Stress Management Stress is a normal reaction to life events. It is what you feel when life demands more than you are used to or more than you can handle. Some stress can be useful. For example, the stress reaction can help you catch the last bus of the day, study for a test, or meet a deadline at work. But stress that occurs too often or for too long can cause problems. It can affect your emotional health and interfere with relationships and normal daily activities. Too much stress can weaken your immune system and increase your risk for physical illness. If you already have a medical problem, stress can make it worse. What are the causes? All sorts of life events may cause stress. An event that causes stress for one person may not be stressful for another person. Major life events commonly cause stress. These may be positive or negative. Examples include losing your job, moving into a new home, getting married, having a baby, or losing a loved one. Less obvious life events may also cause stress, especially if they occur day after day or in combination. Examples include working long hours, driving in traffic, caring for children, being in debt, or being in a difficult relationship. What are the signs or symptoms? Stress may cause emotional symptoms including, the following:  Anxiety. This is feeling worried, afraid, on edge, overwhelmed, or out of control.  Anger. This is feeling irritated or impatient.  Depression. This is feeling sad, down, helpless, or guilty.  Difficulty focusing, remembering, or making decisions.  Stress may cause physical symptoms, including the following:  Aches and pains. These may affect your head, neck, back, stomach, or other areas of your body.  Tight muscles or clenched jaw.  Low energy or trouble sleeping.  Stress may cause unhealthy behaviors, including the following:  Eating to feel better (overeating) or skipping meals.  Sleeping too little,  too much, or both.  Working too much or putting off tasks (procrastination).  Smoking, drinking alcohol, or using drugs to feel better.  How is this diagnosed? Stress is diagnosed through an assessment by your health care provider. Your health care provider will ask questions about your symptoms and any stressful life events.Your health care provider will also ask about your medical history and may order blood tests or other tests. Certain medical conditions and medicine can cause physical symptoms similar to stress. Mental illness can cause emotional symptoms and unhealthy behaviors similar to stress. Your health care provider may refer you to a mental health professional for further evaluation. How is this treated? Stress management is the recommended treatment for stress.The goals of stress management are reducing stressful life events and coping with stress in healthy ways. Techniques for reducing stressful life events include the following:  Stress identification. Self-monitor for stress and identify what causes stress for you. These skills may help you to avoid some stressful events.  Time management. Set your priorities, keep a calendar of events, and learn to say "no." These tools can help you avoid making too many commitments.  Techniques for coping with stress include the following:  Rethinking the problem. Try to think realistically about stressful events rather than ignoring them or overreacting. Try to find the positives in a stressful situation rather than focusing on the negatives.  Exercise. Physical exercise can release both physical and emotional tension. The key is to find a form of exercise you enjoy and do it regularly.  Relaxation techniques. These relax the body and  mind. Examples include yoga, meditation, tai chi, biofeedback, deep breathing, progressive muscle relaxation, listening to music, being out in nature, journaling, and other hobbies. Again, the key is to find  one or more that you enjoy and can do regularly.  Healthy lifestyle. Eat a balanced diet, get plenty of sleep, and do not smoke. Avoid using alcohol or drugs to relax.  Strong support network. Spend time with family, friends, or other people you enjoy being around.Express your feelings and talk things over with someone you trust.  Counseling or talktherapy with a mental health professional may be helpful if you are having difficulty managing stress on your own. Medicine is typically not recommended for the treatment of stress.Talk to your health care provider if you think you need medicine for symptoms of stress. Follow these instructions at home:  Keep all follow-up visits as directed by your health care provider.  Take all medicines as directed by your health care provider. Contact a health care provider if:  Your symptoms get worse or you start having new symptoms.  You feel overwhelmed by your problems and can no longer manage them on your own. Get help right away if:  You feel like hurting yourself or someone else. This information is not intended to replace advice given to you by your health care provider. Make sure you discuss any questions you have with your health care provider. Document Released: 01/13/2001 Document Revised: 12/26/2015 Document Reviewed: 03/14/2013 Elsevier Interactive Patient Education  2017 Elsevier Inc.  

## 2017-01-29 ENCOUNTER — Telehealth: Payer: Self-pay | Admitting: Nurse Practitioner

## 2017-01-29 LAB — CMP14+EGFR
ALT: 13 IU/L (ref 0–32)
AST: 20 IU/L (ref 0–40)
Albumin/Globulin Ratio: 2.3 — ABNORMAL HIGH (ref 1.2–2.2)
Albumin: 3.9 g/dL (ref 3.6–4.8)
Alkaline Phosphatase: 59 IU/L (ref 39–117)
BUN/Creatinine Ratio: 16 (ref 12–28)
BUN: 19 mg/dL (ref 8–27)
CHLORIDE: 114 mmol/L — AB (ref 96–106)
CO2: 16 mmol/L — ABNORMAL LOW (ref 20–29)
Calcium: 8.6 mg/dL — ABNORMAL LOW (ref 8.7–10.3)
Creatinine, Ser: 1.19 mg/dL — ABNORMAL HIGH (ref 0.57–1.00)
GFR calc Af Amer: 55 mL/min/{1.73_m2} — ABNORMAL LOW (ref >59)
GFR calc non Af Amer: 48 mL/min/{1.73_m2} — ABNORMAL LOW (ref >59)
GLUCOSE: 88 mg/dL (ref 65–99)
Globulin, Total: 1.7 g/dL (ref 1.5–4.5)
Potassium: 4.1 mmol/L (ref 3.5–5.2)
Sodium: 145 mmol/L — ABNORMAL HIGH (ref 134–144)
TOTAL PROTEIN: 5.6 g/dL — AB (ref 6.0–8.5)

## 2017-01-29 LAB — ANEMIA PROFILE B
BASOS ABS: 0 10*3/uL (ref 0.0–0.2)
Basos: 0 %
EOS (ABSOLUTE): 0.3 10*3/uL (ref 0.0–0.4)
Eos: 5 %
Ferritin: 184 ng/mL — ABNORMAL HIGH (ref 15–150)
Hematocrit: 40.9 % (ref 34.0–46.6)
Hemoglobin: 13.6 g/dL (ref 11.1–15.9)
IMMATURE GRANULOCYTES: 0 %
IRON: 74 ug/dL (ref 27–139)
Immature Grans (Abs): 0 10*3/uL (ref 0.0–0.1)
Iron Saturation: 33 % (ref 15–55)
LYMPHS ABS: 1.9 10*3/uL (ref 0.7–3.1)
Lymphs: 37 %
MCH: 33.7 pg — AB (ref 26.6–33.0)
MCHC: 33.3 g/dL (ref 31.5–35.7)
MCV: 102 fL — AB (ref 79–97)
MONOS ABS: 0.6 10*3/uL (ref 0.1–0.9)
Monocytes: 12 %
NEUTROS ABS: 2.3 10*3/uL (ref 1.4–7.0)
NEUTROS PCT: 46 %
PLATELETS: 288 10*3/uL (ref 150–379)
RBC: 4.03 x10E6/uL (ref 3.77–5.28)
RDW: 14.1 % (ref 12.3–15.4)
Retic Ct Pct: 1.7 % (ref 0.6–2.6)
TIBC: 226 ug/dL — AB (ref 250–450)
UIBC: 152 ug/dL (ref 118–369)
WBC: 5 10*3/uL (ref 3.4–10.8)

## 2017-01-29 LAB — LIPID PANEL
CHOL/HDL RATIO: 4 ratio (ref 0.0–4.4)
Cholesterol, Total: 189 mg/dL (ref 100–199)
HDL: 47 mg/dL (ref >39)
LDL Calculated: 111 mg/dL — ABNORMAL HIGH (ref 0–99)
Triglycerides: 156 mg/dL — ABNORMAL HIGH (ref 0–149)
VLDL CHOLESTEROL CAL: 31 mg/dL (ref 5–40)

## 2017-01-29 LAB — VITAMIN D 25 HYDROXY (VIT D DEFICIENCY, FRACTURES): VIT D 25 HYDROXY: 48.3 ng/mL (ref 30.0–100.0)

## 2017-01-29 LAB — THYROID PANEL WITH TSH
Free Thyroxine Index: 0.7 — ABNORMAL LOW (ref 1.2–4.9)
T3 Uptake Ratio: 24 % (ref 24–39)
T4 TOTAL: 3 ug/dL — AB (ref 4.5–12.0)
TSH: 3.99 u[IU]/mL (ref 0.450–4.500)

## 2017-02-01 NOTE — Telephone Encounter (Signed)
Copy of labs done 01-28-17 put in the drawer for patient to pickup. Called patient and told her they were ready.

## 2017-02-18 ENCOUNTER — Other Ambulatory Visit: Payer: PPO

## 2017-02-18 ENCOUNTER — Other Ambulatory Visit (HOSPITAL_BASED_OUTPATIENT_CLINIC_OR_DEPARTMENT_OTHER): Payer: PPO

## 2017-02-18 DIAGNOSIS — K922 Gastrointestinal hemorrhage, unspecified: Secondary | ICD-10-CM

## 2017-02-18 DIAGNOSIS — D500 Iron deficiency anemia secondary to blood loss (chronic): Secondary | ICD-10-CM

## 2017-02-18 DIAGNOSIS — D508 Other iron deficiency anemias: Secondary | ICD-10-CM

## 2017-02-18 LAB — CBC WITH DIFFERENTIAL/PLATELET
BASO%: 0.7 % (ref 0.0–2.0)
Basophils Absolute: 0 10*3/uL (ref 0.0–0.1)
EOS%: 4.4 % (ref 0.0–7.0)
Eosinophils Absolute: 0.2 10*3/uL (ref 0.0–0.5)
HCT: 40.1 % (ref 34.8–46.6)
HGB: 12.7 g/dL (ref 11.6–15.9)
LYMPH%: 32.1 % (ref 14.0–49.7)
MCH: 33.3 pg (ref 25.1–34.0)
MCHC: 31.7 g/dL (ref 31.5–36.0)
MCV: 105.2 fL — ABNORMAL HIGH (ref 79.5–101.0)
MONO#: 0.6 10*3/uL (ref 0.1–0.9)
MONO%: 13.6 % (ref 0.0–14.0)
NEUT#: 2.2 10*3/uL (ref 1.5–6.5)
NEUT%: 49.2 % (ref 38.4–76.8)
Platelets: 230 10*3/uL (ref 145–400)
RBC: 3.81 10*6/uL (ref 3.70–5.45)
RDW: 14.6 % — ABNORMAL HIGH (ref 11.2–14.5)
WBC: 4.6 10*3/uL (ref 3.9–10.3)
lymph#: 1.5 10*3/uL (ref 0.9–3.3)

## 2017-02-18 LAB — FERRITIN: Ferritin: 103 ng/ml (ref 9–269)

## 2017-02-24 ENCOUNTER — Telehealth: Payer: Self-pay | Admitting: *Deleted

## 2017-02-24 NOTE — Telephone Encounter (Signed)
-----   Message from Ladell Pier, MD sent at 02/18/2017 12:24 PM EDT ----- Please call patient, hb and ferritin are ok, f/u as scheduled

## 2017-02-24 NOTE — Telephone Encounter (Signed)
Telephone call to patient to advise lab results as directed below. Patient verbalized an understanding and will call this office with any concerns or questions. 

## 2017-02-25 ENCOUNTER — Other Ambulatory Visit: Payer: Self-pay | Admitting: Nurse Practitioner

## 2017-03-24 ENCOUNTER — Other Ambulatory Visit: Payer: Self-pay | Admitting: Nurse Practitioner

## 2017-03-24 DIAGNOSIS — F411 Generalized anxiety disorder: Secondary | ICD-10-CM

## 2017-03-25 NOTE — Telephone Encounter (Signed)
Please call in alprazolam with 0 refills 

## 2017-03-26 NOTE — Telephone Encounter (Signed)
Called to [facility] 

## 2017-03-29 NOTE — Progress Notes (Signed)
Detailed message left for patient to please return hemocult cards

## 2017-04-06 ENCOUNTER — Other Ambulatory Visit: Payer: Self-pay

## 2017-04-06 ENCOUNTER — Telehealth: Payer: Self-pay | Admitting: Oncology

## 2017-04-06 DIAGNOSIS — K912 Postsurgical malabsorption, not elsewhere classified: Secondary | ICD-10-CM

## 2017-04-06 DIAGNOSIS — Z903 Acquired absence of stomach [part of]: Principal | ICD-10-CM

## 2017-04-06 NOTE — Telephone Encounter (Signed)
Confirmed 9/11 lab at 8 am per sch msg

## 2017-04-08 ENCOUNTER — Other Ambulatory Visit: Payer: PPO

## 2017-04-08 DIAGNOSIS — Z1212 Encounter for screening for malignant neoplasm of rectum: Secondary | ICD-10-CM | POA: Diagnosis not present

## 2017-04-08 DIAGNOSIS — Z1211 Encounter for screening for malignant neoplasm of colon: Secondary | ICD-10-CM

## 2017-04-09 ENCOUNTER — Telehealth: Payer: Self-pay | Admitting: Nurse Practitioner

## 2017-04-09 NOTE — Telephone Encounter (Signed)
appt scheduled Pt notified 

## 2017-04-10 LAB — FECAL OCCULT BLOOD, IMMUNOCHEMICAL: FECAL OCCULT BLD: POSITIVE — AB

## 2017-04-12 ENCOUNTER — Ambulatory Visit (INDEPENDENT_AMBULATORY_CARE_PROVIDER_SITE_OTHER): Payer: PPO | Admitting: Nurse Practitioner

## 2017-04-12 ENCOUNTER — Encounter: Payer: Self-pay | Admitting: Nurse Practitioner

## 2017-04-12 VITALS — BP 118/67 | HR 84 | Temp 96.8°F | Ht 66.0 in | Wt 101.0 lb

## 2017-04-12 DIAGNOSIS — J0100 Acute maxillary sinusitis, unspecified: Secondary | ICD-10-CM

## 2017-04-12 MED ORDER — AZITHROMYCIN 250 MG PO TABS: ORAL_TABLET | ORAL | 0 refills | Status: DC

## 2017-04-12 NOTE — Progress Notes (Signed)
   Subjective:    Patient ID: Brittany Hensley, female    DOB: 1951/01/25, 66 y.o.   MRN: [id number]  HPI Patient in today c/o cough and congestion that started 4 days ago. Pressure in face and hurts to chew. Has had dizziness that she has been treating with meclizine. Slight headache.    Review of Systems  Constitutional: Positive for chills and fatigue. Negative for appetite change.  HENT: Positive for congestion, sinus pain and sinus pressure. Negative for sore throat, trouble swallowing and voice change.   Respiratory: Positive for cough. Negative for shortness of breath.   Cardiovascular: Negative.   Gastrointestinal: Negative.   Genitourinary: Negative.   Neurological: Negative.   Psychiatric/Behavioral: Negative.   All other systems reviewed and are negative.      Objective:   Physical Exam  Constitutional: She appears well-developed and well-nourished. No distress.  HENT:  Right Ear: Hearing, tympanic membrane, external ear and ear canal normal.  Left Ear: Hearing, tympanic membrane, external ear and ear canal normal.  Nose: Mucosal edema and rhinorrhea present. Right sinus exhibits maxillary sinus tenderness. Left sinus exhibits maxillary sinus tenderness.  Mouth/Throat: Uvula is midline, oropharynx is clear and moist and mucous membranes are normal.  Neck: Normal range of motion. Neck supple.  Cardiovascular: Normal rate and regular rhythm.   Pulmonary/Chest: Effort normal.  Abdominal: Soft.  Musculoskeletal: Normal range of motion.  Skin: Skin is warm.  Psychiatric: She has a normal mood and affect. Her behavior is normal. Judgment and thought content normal.    BP 118/67   Pulse 84   Temp (!) 96.8 F (36 C) (Oral)   Ht 5\' 6"  (1.676 m)   Wt 101 lb (45.8 kg)   BMI 16.30 kg/m      Assessment & Plan:  1. Acute non-recurrent maxillary sinusitis  - azithromycin (ZITHROMAX Z-PAK) 250 MG tablet; As directed  Dispense: 6 tablet; Refill: 0 1. Take meds as  prescribed 2. Use a cool mist humidifier especially during the winter months and when heat has been humid. 3. Use saline nose sprays frequently 4. Saline irrigations of the nose can be very helpful if done frequently.  * 4X daily for 1 week*  * Use of a nettie pot can be helpful with this. Follow directions with this* 5. Drink plenty of fluids 6. Keep thermostat turn down low 7.For any cough or congestion  Use plain Mucinex- regular strength or max strength is fine   * Children- consult with Pharmacist for dosing 8. For fever or aces or pains- take tylenol or ibuprofen appropriate for age and weight.  * for fevers greater than 101 orally you may alternate ibuprofen and tylenol every  3 hours.   Meds ordered this encounter  Medications  . azithromycin (ZITHROMAX Z-PAK) 250 MG tablet    Sig: As directed    Dispense:  6 tablet    Refill:  0    Order Specific Question:   Supervising Provider    Answer:   Eustaquio Maize [[id number]]   Mary-Margaret Hassell Done, FNP

## 2017-04-12 NOTE — Patient Instructions (Signed)
[facility] [address] [place] ,[place] [address] [phone] Children limited to anxiety and depression- NO ADD/ADHD Does not accept Medicaid  [facility] [address] [place], [place] Does see children Does accept medicaid Will assess for Autism but not treat  [facility]. [address] [place] [phone] Does see children  Does accept Medicaid Medication management- substance abuse- bipolar- grief- family-marriage- OCD- Anxiety- PTSD  [facility] Does see children Does accept medicaid They do perform psychological testing  [facility] [address] [place] Schedule through [place]. [phone] Patient must call and make own appointment Does se children Does accept Medicaid  [facility] [place], [place] 7-10 accompanied by an adult, 11 and up by themselves Does accept Medicaid Will see patients with- substance abuse-ADHD-ADD-Bipolar-Domestic violence-Marriage counseling- Family Counseling and sexual abuse  [place] and Psychiatrist [address] [place] [phone] Does see children Does accept [facility] [place] [phone]  Dr. Sabra Heck-  Psychiatrist [address], [place] Specializes in ADHD and addictions They do ADHD testing Suboxone clinic  [facility] [address] [place] Does Child psychological testing  [facility] [address] Dr. Dellia [place] [phone] Does Accept Medicaid Evaluates for Autism  [facility] [place] [phone] Does Not accept Medicaid Does do adult ADD  evaluations  Dr. Lorenza Evangelist [address] [place] [phone] Does not Take Medicaid Sees ADD and ADHD for treatment      [facility] [address] [place], [place] [address] [phone] Takes Medicaid WIll see children as young as 65

## 2017-04-13 ENCOUNTER — Other Ambulatory Visit: Payer: PPO

## 2017-04-20 ENCOUNTER — Other Ambulatory Visit (HOSPITAL_BASED_OUTPATIENT_CLINIC_OR_DEPARTMENT_OTHER): Payer: PPO

## 2017-04-20 ENCOUNTER — Telehealth: Payer: Self-pay | Admitting: Emergency Medicine

## 2017-04-20 DIAGNOSIS — D500 Iron deficiency anemia secondary to blood loss (chronic): Secondary | ICD-10-CM

## 2017-04-20 DIAGNOSIS — Z903 Acquired absence of stomach [part of]: Principal | ICD-10-CM

## 2017-04-20 DIAGNOSIS — K912 Postsurgical malabsorption, not elsewhere classified: Secondary | ICD-10-CM

## 2017-04-20 DIAGNOSIS — K922 Gastrointestinal hemorrhage, unspecified: Secondary | ICD-10-CM | POA: Diagnosis not present

## 2017-04-20 LAB — CBC WITH DIFFERENTIAL/PLATELET
BASO%: 1.3 % (ref 0.0–2.0)
Basophils Absolute: 0.1 10*3/uL (ref 0.0–0.1)
EOS ABS: 0.2 10*3/uL (ref 0.0–0.5)
EOS%: 3.6 % (ref 0.0–7.0)
HCT: 39.9 % (ref 34.8–46.6)
HEMOGLOBIN: 13.1 g/dL (ref 11.6–15.9)
LYMPH#: 1.5 10*3/uL (ref 0.9–3.3)
LYMPH%: 28 % (ref 14.0–49.7)
MCH: 33.7 pg (ref 25.1–34.0)
MCHC: 32.9 g/dL (ref 31.5–36.0)
MCV: 102.3 fL — AB (ref 79.5–101.0)
MONO#: 0.5 10*3/uL (ref 0.1–0.9)
MONO%: 9.5 % (ref 0.0–14.0)
NEUT%: 57.6 % (ref 38.4–76.8)
NEUTROS ABS: 3 10*3/uL (ref 1.5–6.5)
PLATELETS: 314 10*3/uL (ref 145–400)
RBC: 3.9 10*6/uL (ref 3.70–5.45)
RDW: 15 % — AB (ref 11.2–14.5)
WBC: 5.2 10*3/uL (ref 3.9–10.3)

## 2017-04-20 LAB — FERRITIN: Ferritin: 89 ng/ml (ref 9–269)

## 2017-04-20 NOTE — Telephone Encounter (Signed)
Pt returned call, informed her of lab result. She understands to follow up as scheduled.

## 2017-04-20 NOTE — Telephone Encounter (Signed)
Ladell Pier, MD  [facility]        Please call patient, hb is normal, ferritin is lower and still normal, f/u as scheduled    Left pt VM on home phone to call back regarding results. Cell phone not in service.

## 2017-04-28 ENCOUNTER — Other Ambulatory Visit: Payer: Self-pay | Admitting: Nurse Practitioner

## 2017-04-28 ENCOUNTER — Other Ambulatory Visit: Payer: Self-pay | Admitting: Gastroenterology

## 2017-04-28 DIAGNOSIS — F411 Generalized anxiety disorder: Secondary | ICD-10-CM

## 2017-04-28 NOTE — Telephone Encounter (Signed)
Please call in alprazolam with 1 refills 

## 2017-04-28 NOTE — Telephone Encounter (Signed)
Rx called to pharmacy

## 2017-04-28 NOTE — Telephone Encounter (Signed)
t seen 04/12/17  MMM If approved route to nurse to call into [facility]

## 2017-05-11 ENCOUNTER — Ambulatory Visit (INDEPENDENT_AMBULATORY_CARE_PROVIDER_SITE_OTHER): Payer: PPO | Admitting: Nurse Practitioner

## 2017-05-11 ENCOUNTER — Encounter: Payer: Self-pay | Admitting: Nurse Practitioner

## 2017-05-11 VITALS — BP 111/67 | HR 69 | Temp 97.1°F | Wt 95.0 lb

## 2017-05-11 DIAGNOSIS — F411 Generalized anxiety disorder: Secondary | ICD-10-CM | POA: Diagnosis not present

## 2017-05-11 DIAGNOSIS — E034 Atrophy of thyroid (acquired): Secondary | ICD-10-CM | POA: Diagnosis not present

## 2017-05-11 DIAGNOSIS — R0981 Nasal congestion: Secondary | ICD-10-CM | POA: Diagnosis not present

## 2017-05-11 DIAGNOSIS — K219 Gastro-esophageal reflux disease without esophagitis: Secondary | ICD-10-CM | POA: Diagnosis not present

## 2017-05-11 DIAGNOSIS — Z681 Body mass index (BMI) 19 or less, adult: Secondary | ICD-10-CM | POA: Diagnosis not present

## 2017-05-11 DIAGNOSIS — D508 Other iron deficiency anemias: Secondary | ICD-10-CM | POA: Diagnosis not present

## 2017-05-11 MED ORDER — CHLORPHEN-PE-ACETAMINOPHEN 4-10-325 MG PO TABS: 1.0000 | ORAL_TABLET | Freq: Four times a day (QID) | ORAL | 0 refills | Status: DC | PRN

## 2017-05-11 NOTE — Progress Notes (Signed)
Subjective:    Patient ID: MARIZOL BORROR, female    DOB: Apr 16, 1951, 66 y.o.   MRN: [id number]  HPI KANESHA CADLE is here today for follow up of chronic medical problem.  Outpatient Encounter Prescriptions as of 05/11/2017  Medication Sig  . ALPRAZolam (XANAX) 0.5 MG tablet TAKE ONE TABLET FOUR TIMES DAILY  . azithromycin (ZITHROMAX Z-PAK) 250 MG tablet As directed  . CALCIUM CITRATE PO Take 600 mg by mouth 3 (three) times daily.  . fish oil-omega-3 fatty acids 1000 MG capsule Take 1 g by mouth every evening.   . lactobacillus acidophilus (BACID) TABS tablet Take 2 tablets by mouth daily.  Marland Kitchen levothyroxine (SYNTHROID, LEVOTHROID) 100 MCG tablet TAKE ONE TABLET EVERY MORNING  . loratadine (CLARITIN) 10 MG tablet Take 10 mg by mouth 2 (two) times daily.    . magnesium gluconate (MAGONATE) 500 MG tablet Take 500 mg by mouth 2 (two) times daily.   . metroNIDAZOLE (METROGEL) 0.75 % gel Apply topically at bedtime.  . Multiple Vitamin (MULTIVITAMIN) capsule Take 1 capsule by mouth 2 (two) times daily.    Marland Kitchen omeprazole (PRILOSEC) 40 MG capsule TAKE ONE (1) CAPSULE EACH DAY  . sertraline (ZOLOFT) 100 MG tablet Take 1 tablet (100 mg total) by mouth at bedtime.  . sucralfate (CARAFATE) 1 G tablet Take 1 tablet by mouth 3 (three) times daily.   . vitamin C (ASCORBIC ACID) 500 MG tablet Take 500 mg by mouth 2 (two) times daily.     No facility-administered encounter medications on file as of 05/11/2017.     1. Gastroesophageal reflux disease without esophagitis  Symptoms managed with daily omeprazole.    2. Hypothyroidism due to acquired atrophy of thyroid Patient taking levothyroxine daily.  Labs monitored regularly.   3. Other iron deficiency anemia  Patient not currently taking an iron supplement.  Last IV iron treatment was November 2017.  Having ferritin level checked again next Thursday.  4. BMI less than 19,adult  No significant weight gain or loss.  5. GAD (generalized anxiety  disorder)  Xanax and Zoloft for symptom management.  Patient states this is helpful at this time.    New complaints: Patient still having some congestion continued from last month and she needs this cleared up before her balloon dilation.  Patient scheduled for EGD and balloon dilation on 05/25/17 with Dr. Watt Climes.  Social history:    Review of Systems  Constitutional: Positive for appetite change (chronic, scheduled for EGD/balloon dilation on 05/25/17). Negative for activity change.  HENT:       Nasal/sinus congestion   Respiratory: Positive for cough (non-productive) and chest tightness (increased since previous visit). Negative for shortness of breath.   Cardiovascular: Negative for chest pain and palpitations.  Gastrointestinal: Positive for nausea (chronic). Negative for vomiting.  Neurological: Negative for dizziness, light-headedness and headaches.  All other systems reviewed and are negative.      Objective:   Physical Exam  Constitutional: She is oriented to person, place, and time. She appears well-developed. No distress.  HENT:  Head: Normocephalic.  Right Ear: External ear normal.  Left Ear: External ear normal.  Nose: No rhinorrhea. Right sinus exhibits maxillary sinus tenderness and frontal sinus tenderness. Left sinus exhibits no maxillary sinus tenderness and no frontal sinus tenderness.  Mouth/Throat: Oropharynx is clear and moist.  Unable to visualize TMs d/t cerumen    Eyes: Pupils are equal, round, and reactive to light.  Neck: Normal range of motion. Neck supple. No  thyromegaly present.  Cardiovascular: Normal rate, regular rhythm, normal heart sounds and intact distal pulses.   Pulmonary/Chest: Effort normal and breath sounds normal. No respiratory distress. She has no wheezes.  Abdominal: Soft. Bowel sounds are normal. She exhibits no distension. There is no tenderness.  Musculoskeletal: Normal range of motion.  Lymphadenopathy:    She has no cervical  adenopathy.  Neurological: She is alert and oriented to person, place, and time.  Skin: Skin is warm and dry.  Psychiatric: She has a normal mood and affect. Her behavior is normal.   BP 111/67   Pulse 69   Temp (!) 97.1 F (36.2 C) (Oral)   Wt 95 lb (43.1 kg)   BMI 15.33 kg/m     Assessment & Plan:  1. Gastroesophageal reflux disease without esophagitis Avoid spicy foods Do not eat 2 hours prior to bedtim  2. Hypothyroidism due to acquired atrophy of thyroid - CMP14+EGFR - Lipid panel  3. Other iron deficiency anemia Keep follow up appointment with hematology  4. BMI less than 19,adult Discussed diet and exercise for person with BMI >25 Will recheck weight in 3-6 months  5. GAD (generalized anxiety disorder) Stress management  6. Sinus congestion 1. Take meds as prescribed 2. Use a cool mist humidifier especially during the winter months and when heat has been humid. 3. Use saline nose sprays frequently 4. Saline irrigations of the nose can be very helpful if done frequently.  * 4X daily for 1 week*  * Use of a nettie pot can be helpful with this. Follow directions with this* 5. Drink plenty of fluids 6. Keep thermostat turn down low 7.For any cough or congestion  Use plain Mucinex- regular strength or max strength is fine   * Children- consult with Pharmacist for dosing 8. For fever or aces or pains- take tylenol or ibuprofen appropriate for age and weight.  * for fevers greater than 101 orally you may alternate ibuprofen and tylenol every  3 hours.    - Chlorphen-PE-Acetaminophen 4-10-325 MG TABS; Take 1 tablet by mouth every 6 (six) hours as needed.  Dispense: 20 tablet; Refill: 0    Labs pending Health maintenance reviewed Diet and exercise encouraged Continue all meds Follow up  In 3 months   [place], FNP

## 2017-05-11 NOTE — Patient Instructions (Signed)

## 2017-05-12 LAB — CMP14+EGFR
ALBUMIN: 3.6 g/dL (ref 3.6–4.8)
ALT: 14 IU/L (ref 0–32)
AST: 21 IU/L (ref 0–40)
Albumin/Globulin Ratio: 2 (ref 1.2–2.2)
Alkaline Phosphatase: 63 IU/L (ref 39–117)
BUN / CREAT RATIO: 19 (ref 12–28)
BUN: 22 mg/dL (ref 8–27)
Bilirubin Total: <0.2 mg/dL (ref 0.0–1.2)
CO2: 15 mmol/L — AB (ref 20–29)
CREATININE: 1.15 mg/dL — AB (ref 0.57–1.00)
Calcium: 8.2 mg/dL — ABNORMAL LOW (ref 8.7–10.3)
Chloride: 113 mmol/L — ABNORMAL HIGH (ref 96–106)
GFR calc non Af Amer: 50 mL/min/{1.73_m2} — ABNORMAL LOW (ref >59)
GFR, EST AFRICAN AMERICAN: 57 mL/min/{1.73_m2} — AB (ref >59)
GLUCOSE: 119 mg/dL — AB (ref 65–99)
Globulin, Total: 1.8 g/dL (ref 1.5–4.5)
Potassium: 4 mmol/L (ref 3.5–5.2)
Sodium: 142 mmol/L (ref 134–144)
TOTAL PROTEIN: 5.4 g/dL — AB (ref 6.0–8.5)

## 2017-05-12 LAB — LIPID PANEL
CHOLESTEROL TOTAL: 176 mg/dL (ref 100–199)
Chol/HDL Ratio: 3.8 ratio (ref 0.0–4.4)
HDL: 46 mg/dL (ref >39)
LDL CALC: 102 mg/dL — AB (ref 0–99)
Triglycerides: 139 mg/dL (ref 0–149)
VLDL CHOLESTEROL CAL: 28 mg/dL (ref 5–40)

## 2017-05-17 ENCOUNTER — Encounter (HOSPITAL_COMMUNITY): Payer: Self-pay

## 2017-05-20 ENCOUNTER — Ambulatory Visit (HOSPITAL_COMMUNITY)
Admission: RE | Admit: 2017-05-20 | Discharge: 2017-05-20 | Disposition: A | Discharge status: Home / Self Care | Payer: PPO | Source: Ambulatory Visit | Attending: Gastroenterology | Admitting: Gastroenterology

## 2017-05-20 ENCOUNTER — Ambulatory Visit (HOSPITAL_COMMUNITY): Payer: PPO | Admitting: Anesthesiology

## 2017-05-20 ENCOUNTER — Encounter (HOSPITAL_COMMUNITY)
Admission: RE | Disposition: A | Discharge status: Home / Self Care | Payer: Self-pay | Source: Ambulatory Visit | Attending: Gastroenterology

## 2017-05-20 ENCOUNTER — Encounter (HOSPITAL_COMMUNITY): Payer: Self-pay | Admitting: Emergency Medicine

## 2017-05-20 ENCOUNTER — Other Ambulatory Visit: Payer: PPO

## 2017-05-20 ENCOUNTER — Ambulatory Visit: Payer: PPO | Admitting: Oncology

## 2017-05-20 DIAGNOSIS — K449 Diaphragmatic hernia without obstruction or gangrene: Secondary | ICD-10-CM | POA: Diagnosis not present

## 2017-05-20 DIAGNOSIS — K259 Gastric ulcer, unspecified as acute or chronic, without hemorrhage or perforation: Secondary | ICD-10-CM | POA: Diagnosis not present

## 2017-05-20 DIAGNOSIS — Z980 Intestinal bypass and anastomosis status: Secondary | ICD-10-CM | POA: Diagnosis not present

## 2017-05-20 DIAGNOSIS — K2950 Unspecified chronic gastritis without bleeding: Secondary | ICD-10-CM | POA: Diagnosis not present

## 2017-05-20 DIAGNOSIS — Z79899 Other long term (current) drug therapy: Secondary | ICD-10-CM | POA: Insufficient documentation

## 2017-05-20 DIAGNOSIS — K311 Adult hypertrophic pyloric stenosis: Secondary | ICD-10-CM | POA: Diagnosis not present

## 2017-05-20 DIAGNOSIS — E039 Hypothyroidism, unspecified: Secondary | ICD-10-CM | POA: Diagnosis not present

## 2017-05-20 DIAGNOSIS — K219 Gastro-esophageal reflux disease without esophagitis: Secondary | ICD-10-CM | POA: Diagnosis not present

## 2017-05-20 DIAGNOSIS — K9189 Other postprocedural complications and disorders of digestive system: Secondary | ICD-10-CM | POA: Diagnosis not present

## 2017-05-20 HISTORY — PX: BALLOON DILATION: SHX5330

## 2017-05-20 HISTORY — PX: ESOPHAGOGASTRODUODENOSCOPY (EGD) WITH PROPOFOL: SHX5813

## 2017-05-20 SURGERY — ESOPHAGOGASTRODUODENOSCOPY (EGD) WITH PROPOFOL
Anesthesia: Monitor Anesthesia Care

## 2017-05-20 MED ORDER — PROPOFOL 500 MG/50ML IV EMUL
INTRAVENOUS | Status: DC | PRN
  Administered 2017-05-20: 75 ug/kg/min via INTRAVENOUS

## 2017-05-20 MED ORDER — ONDANSETRON HCL 4 MG/2ML IJ SOLN
INTRAMUSCULAR | Status: DC | PRN
  Administered 2017-05-20: 4 mg via INTRAVENOUS

## 2017-05-20 MED ORDER — PROPOFOL 10 MG/ML IV BOLUS
INTRAVENOUS | Status: AC
  Filled 2017-05-20: qty 40

## 2017-05-20 MED ORDER — PROPOFOL 500 MG/50ML IV EMUL
INTRAVENOUS | Status: DC | PRN
  Administered 2017-05-20: 40 mg via INTRAVENOUS

## 2017-05-20 MED ORDER — LACTATED RINGERS IV SOLN
INTRAVENOUS | Status: DC | PRN
  Administered 2017-05-20: 11:00:00 via INTRAVENOUS

## 2017-05-20 SURGICAL SUPPLY — 15 items

## 2017-05-20 NOTE — Anesthesia Postprocedure Evaluation (Signed)
Anesthesia Post Note  Patient: Jency Schnieders Popescu  Procedure(s) Performed: ESOPHAGOGASTRODUODENOSCOPY (EGD) WITH PROPOFOL (N/A ) BALLOON DILATION (N/A )     Patient location during evaluation: PACU Anesthesia Type: MAC Level of consciousness: awake and alert Pain management: pain level controlled Vital Signs Assessment: post-procedure vital signs reviewed and stable Respiratory status: spontaneous breathing and respiratory function stable Cardiovascular status: stable Postop Assessment: no apparent nausea or vomiting Anesthetic complications: no    Last Vitals:  Vitals:   05/20/17 1310 05/20/17 1316  BP: 115/70 116/65  Pulse: 85 82  Resp: 15 17  Temp:    SpO2: 94% 97%    Last Pain:  Vitals:   05/20/17 1007  TempSrc: Oral                 Temitope Griffing DANIEL

## 2017-05-20 NOTE — Discharge Instructions (Signed)
Esophagogastroduodenoscopy, Care After Refer to this sheet in the next few weeks. These instructions provide you with information about caring for yourself after your procedure. Your health care provider may also give you more specific instructions. Your treatment has been planned according to current medical practices, but problems sometimes occur. Call your health care provider if you have any problems or questions after your procedure. What can I expect after the procedure? After the procedure, it is common to have:  A sore throat.  Nausea.  Bloating.  Dizziness.  Fatigue.  Follow these instructions at home:  Do not eat or drink anything until the numbing medicine (local anesthetic) has worn off and your gag reflex has returned. You will know that the local anesthetic has worn off when you can swallow comfortably.  Do not drive for 24 hours if you received a medicine to help you relax (sedative).  If your health care provider took a tissue sample for testing during the procedure, make sure to get your test results. This is your responsibility. Ask your health care provider or the department performing the test when your results will be ready.  Keep all follow-up visits as told by your health care provider. This is important. Contact a health care provider if:  You cannot stop coughing.  You are not urinating.  You are urinating less than usual. Get help right away if:  You have trouble swallowing.  You cannot eat or drink.  You have throat or chest pain that gets worse.  You are dizzy or light-headed.  You faint.  You have nausea or vomiting.  You have chills.  You have a fever.  You have severe abdominal pain.  You have black, tarry, or bloody stools. This information is not intended to replace advice given to you by your health care provider. Make sure you discuss any questions you have with your health care provider. Document Released: 07/06/2012 Document  Revised: 12/26/2015 Document Reviewed: 06/13/2015 Elsevier Interactive Patient Education  2018 [place] liquids only until 6 PM and if doing well may have soft solids and slowly advance diet as tolerated tomorrow and call if question or problem otherwise follow-up in 2 months

## 2017-05-20 NOTE — Op Note (Signed)
[facility] Patient Name: Brittany Hensley Procedure Date: 05/20/2017 MRN: [id number] Attending MD: Clarene Essex , MD Date of Birth: [birth year] CSN: [id number] Age: 66 Admit Type: Outpatient Procedure:                Upper GI endoscopy Indications:              For therapy of post-surgical anastomotic stenosis Providers:                Clarene Essex, MD, Elmer Ramp. Hinson, RN, [employer],                            Technician, [employer], CRNA Referring MD:              Medicines:                Propofol total dose 100 mg IV, Ondansetron 4 mg IV Complications:            No immediate complications. Estimated Blood Loss:     Estimated blood loss was minimal. Procedure:                Pre-Anesthesia Assessment:                           - Prior to the procedure, a History and Physical                            was performed, and patient medications and                            allergies were reviewed. The patient's tolerance of                            previous anesthesia was also reviewed. The risks                            and benefits of the procedure and the sedation                            options and risks were discussed with the patient.                            All questions were answered, and informed consent                            was obtained. Prior Anticoagulants: The patient has                            taken no previous anticoagulant or antiplatelet                            agents. ASA Grade Assessment: II - A patient with                            mild systemic disease. After reviewing the risks  and benefits, the patient was deemed in                            satisfactory condition to undergo the procedure.                           After obtaining informed consent, the endoscope was                            passed under direct vision. Throughout the                            procedure, the patient's blood  pressure, pulse, and                            oxygen saturations were monitored continuously. The                            Endoscope was introduced through the mouth, and                            advanced to the jejunum. The upper GI endoscopy was                            accomplished without difficulty. The patient                            tolerated the procedure well. Scope In: Scope Out: Findings:      The larynx was normal.      A small hiatal hernia was present.      A medium amount of food (residue) was found on the greater curvature of       the stomach.      Few non-bleeding superficial gastric ulcers were found at the       anastomosis.      Diffuse moderate inflammation characterized by congestion (edema) and       erythema was found in the entire examined stomach.      Evidence of a Roux-en-Y gastrojejunostomy was found. The gastrojejunal       anastomosis was characterized by moderate stenosis. This was traversed       after dilation. The pouch-to-jejunum limb was characterized by healthy       appearing mucosa. The duodenum-to-jejunum limb was examined. A TTS       dilator was passed through the scope. Dilation with a 05-14-11 mm, a       12-13.5-15 mm and a 15-16.5-18 mm pyloric balloon dilator was performed.      The examined jejunum was normal.      The exam was otherwise without abnormality. Impression:               - Normal larynx.                           - Small hiatal hernia.                           - A medium amount of  food (residue) in the stomach.                           - Non-bleeding gastric ulcers.                           - Chronic gastritis.                           - Roux-en-Y gastrojejunostomy with gastrojejunal                            anastomosis characterized by moderate stenosis.                            Dilated.                           - Normal examined jejunum.                           - The examination was otherwise  normal.                           - No specimens collected. Moderate Sedation:      N/A- Per Anesthesia Care Recommendation:           - Patient has a contact number available for                            emergencies. The signs and symptoms of potential                            delayed complications were discussed with the                            patient. Return to normal activities tomorrow.                            Written discharge instructions were provided to the                            patient.                           - Clear liquid diet for 6 hours.                           - Continue present medications.                           - Return to GI clinic in 2 months.                           - Telephone GI clinic if symptomatic PRN. Procedure Code(s):        --- Professional ---  50037, Esophagogastroduodenoscopy, flexible,                            transoral; with dilation of gastric/duodenal                            stricture(s) (eg, balloon, bougie) Diagnosis Code(s):        --- Professional ---                           K44.9, Diaphragmatic hernia without obstruction or                            gangrene                           K25.9, Gastric ulcer, unspecified as acute or                            chronic, without hemorrhage or perforation                           K29.50, Unspecified chronic gastritis without                            bleeding                           Z98.0, Intestinal bypass and anastomosis status                           K91.89, Other postprocedural complications and                            disorders of digestive system CPT copyright 2016 American Medical Association. All rights reserved. The codes documented in this report are preliminary and upon coder review may  be revised to meet current compliance requirements. Clarene Essex, MD 05/20/2017 1:09:25 PM This report has been signed  electronically. Number of Addenda: 0

## 2017-05-20 NOTE — Anesthesia Preprocedure Evaluation (Signed)
Anesthesia Evaluation  Patient identified by MRN, date of birth, ID band Patient awake    Reviewed: Allergy & Precautions, NPO status , Patient's Chart, lab work & pertinent test results  Airway Mallampati: II  TM Distance: >3 FB Neck ROM: Full    Dental  (+) Teeth Intact, Dental Advisory Given, Caps   Pulmonary neg pulmonary ROS,    breath sounds clear to auscultation       Cardiovascular  Rhythm:Regular Rate:Normal     Neuro/Psych Anxiety    GI/Hepatic GERD  ,  Endo/Other  Hypothyroidism   Renal/GU      Musculoskeletal  (+) Arthritis ,   Abdominal (+) - obese,   Peds  Hematology  (+) anemia ,   Anesthesia Other Findings   Reproductive/Obstetrics                             Anesthesia Physical  Anesthesia Plan  ASA: III  Anesthesia Plan: MAC   Post-op Pain Management:    Induction: Intravenous  PONV Risk Score and Plan:   Airway Management Planned: Simple Face Mask and Natural Airway  Additional Equipment:   Intra-op Plan:   Post-operative Plan: Extubation in OR  Informed Consent: I have reviewed the patients History and Physical, chart, labs and discussed the procedure including the risks, benefits and alternatives for the proposed anesthesia with the patient or authorized representative who has indicated his/her understanding and acceptance.   Dental advisory given  Plan Discussed with:   Anesthesia Plan Comments:         Anesthesia Quick Evaluation

## 2017-05-20 NOTE — Progress Notes (Signed)
Brittany Hensley 12:37 PM  Subjective: Patient with worsening upper tract symptoms and signs of increased gastric outlet obstruction but her recent labs were okay and her hemoglobin okay and no new medical problems since we seen her in the office  Objective: Vital signs stable afebrile no acute distress exam please see preassessment evaluation  Assessment: Anastomotic stricture  Plan: Okay to proceed with endoscopy with probable dilation with anesthesia assistance  [facility]  Pager [phone] After 5PM or if no answer call [phone]

## 2017-05-20 NOTE — Transfer of Care (Signed)
Immediate Anesthesia Transfer of Care Note  Patient: Brittany Hensley  Procedure(s) Performed: Procedure(s): ESOPHAGOGASTRODUODENOSCOPY (EGD) WITH PROPOFOL (N/A) BALLOON DILATION (N/A)  Patient Location: PACU  Anesthesia Type:MAC  Level of Consciousness:  sedated, patient cooperative and responds to stimulation  Airway & Oxygen Therapy:Patient Spontanous Breathing and Patient connected to face mask oxgen  Post-op Assessment:  Report given to PACU RN and Post -op Vital signs reviewed and stable  Post vital signs:  Reviewed and stable  Last Vitals:  Vitals:   05/20/17 1007  BP: 132/73  Pulse: 79  Resp: 14  Temp: 36.5 C  SpO2: 54%    Complications: No apparent anesthesia complications

## 2017-05-21 ENCOUNTER — Telehealth: Payer: Self-pay | Admitting: Family Medicine

## 2017-05-21 ENCOUNTER — Ambulatory Visit (INDEPENDENT_AMBULATORY_CARE_PROVIDER_SITE_OTHER): Payer: PPO | Admitting: Family Medicine

## 2017-05-21 ENCOUNTER — Ambulatory Visit (INDEPENDENT_AMBULATORY_CARE_PROVIDER_SITE_OTHER): Payer: PPO

## 2017-05-21 VITALS — BP 97/61 | HR 93 | Temp 98.7°F | Ht 66.0 in | Wt 92.0 lb

## 2017-05-21 DIAGNOSIS — R05 Cough: Secondary | ICD-10-CM

## 2017-05-21 DIAGNOSIS — R059 Cough, unspecified: Secondary | ICD-10-CM

## 2017-05-21 DIAGNOSIS — R918 Other nonspecific abnormal finding of lung field: Secondary | ICD-10-CM | POA: Insufficient documentation

## 2017-05-21 MED ORDER — METHYLPREDNISOLONE ACETATE 80 MG/ML IJ SUSP
80.0000 mg | Freq: Once | INTRAMUSCULAR | Status: AC
  Administered 2017-05-21: 80 mg via INTRAMUSCULAR

## 2017-05-21 MED ORDER — ALBUTEROL SULFATE HFA 108 (90 BASE) MCG/ACT IN AERS: 2.0000 | INHALATION_SPRAY | Freq: Four times a day (QID) | RESPIRATORY_TRACT | 0 refills | Status: DC | PRN

## 2017-05-21 NOTE — Progress Notes (Signed)
   HPI  Patient presents today here with cough.  Patient explains that this began back in September.  She was originally given azithromycin and tried some over-the-counter cough medications as well.  She underwent a balloon dilation of the esophagus 1 day ago and is doing well from that.  She does have a sore throat she expects from that procedure.  She has had some chills and a deep productive cough of yellow sputum. She denies shortness of breath overt fever, or history of asthma or COPD.  She is also a never smoker.  PMH: Smoking status noted ROS: Per HPI  Objective: BP 97/61   Pulse 93   Temp 98.7 F (37.1 C) (Oral)   Ht 5\' 6"  (1.676 m)   Wt 92 lb (41.7 kg)   BMI 14.85 kg/m  Gen: NAD, alert, cooperative with exam HEENT: NCAT, no facial pain to palpation of maxillary or frontal sinuses, left TM obscured by cerumen, right TM within normal limits, oropharynx moist and clear, nares with swollen turbinates bilaterally CV: RRR, good S1/S2, no murmur Resp: CTABL, no wheezes, non-labored  Ext: No edema, warm Neuro: Alert and oriented, No gross deficits  Assessment and plan:  #Cough Unclear etiology, most likely bronchitic-like illness or post infectious illness. IM Depo-Medrol given Plain film to rule out underlying infiltrate, of course will send antibiotic if necessary.    Orders Placed This Encounter  Procedures  . DG Chest 2 View    Standing Status:   Future    Standing Expiration Date:   07/21/2018    Order Specific Question:   Reason for Exam (SYMPTOM  OR DIAGNOSIS REQUIRED)    Answer:   Cough 6 weeks    Order Specific Question:   Preferred imaging location?    Answer:   Internal    Order Specific Question:   Radiology Contrast Protocol - do NOT remove file path    Answer:   [email or web address]    Meds ordered this encounter  Medications  . methylPREDNISolone acetate (DEPO-MEDROL) injection 80 mg    Laroy Apple,  MD Tristan Schroeder [facility] 05/21/2017, 4:24 PM

## 2017-05-21 NOTE — Telephone Encounter (Signed)
Called and discussed lung nodule vs artifact, need for f/u CT- order placed.   Laroy Apple, MD [facility] 05/21/2017, 6:54 PM

## 2017-05-21 NOTE — Patient Instructions (Signed)
Great to see you!   Acute Bronchitis, Adult Acute bronchitis is when air tubes (bronchi) in the lungs suddenly get swollen. The condition can make it hard to breathe. It can also cause these symptoms:  A cough.  Coughing up clear, yellow, or green mucus.  Wheezing.  Chest congestion.  Shortness of breath.  A fever.  Body aches.  Chills.  A sore throat.  Follow these instructions at home: Medicines  Take over-the-counter and prescription medicines only as told by your doctor.  If you were prescribed an antibiotic medicine, take it as told by your doctor. Do not stop taking the antibiotic even if you start to feel better. General instructions  Rest.  Drink enough fluids to keep your pee (urine) clear or pale yellow.  Avoid smoking and secondhand smoke. If you smoke and you need help quitting, ask your doctor. Quitting will help your lungs heal faster.  Use an inhaler, cool mist vaporizer, or humidifier as told by your doctor.  Keep all follow-up visits as told by your doctor. This is important. How is this prevented? To lower your risk of getting this condition again:  Wash your hands often with soap and water. If you cannot use soap and water, use hand sanitizer.  Avoid contact with people who have cold symptoms.  Try not to touch your hands to your mouth, nose, or eyes.  Make sure to get the flu shot every year.  Contact a doctor if:  Your symptoms do not get better in 2 weeks. Get help right away if:  You cough up blood.  You have chest pain.  You have very bad shortness of breath.  You become dehydrated.  You faint (pass out) or keep feeling like you are going to pass out.  You keep throwing up (vomiting).  You have a very bad headache.  Your fever or chills gets worse. This information is not intended to replace advice given to you by your health care provider. Make sure you discuss any questions you have with your health care  provider. Document Released: 01/06/2008 Document Revised: 02/26/2016 Document Reviewed: 01/08/2016 Elsevier Interactive Patient Education  2017 [employer]  

## 2017-05-24 ENCOUNTER — Telehealth: Payer: Self-pay | Admitting: Nurse Practitioner

## 2017-05-24 NOTE — Telephone Encounter (Signed)
Please advise if you will review and give antibiotic.

## 2017-05-24 NOTE — Telephone Encounter (Signed)
Spoke with patient and recommended for her  To go ahead and have done

## 2017-05-27 ENCOUNTER — Ambulatory Visit (HOSPITAL_BASED_OUTPATIENT_CLINIC_OR_DEPARTMENT_OTHER): Payer: PPO | Admitting: Nurse Practitioner

## 2017-05-27 ENCOUNTER — Telehealth: Payer: Self-pay | Admitting: *Deleted

## 2017-05-27 ENCOUNTER — Telehealth: Payer: Self-pay | Admitting: Oncology

## 2017-05-27 ENCOUNTER — Other Ambulatory Visit (HOSPITAL_BASED_OUTPATIENT_CLINIC_OR_DEPARTMENT_OTHER): Payer: PPO

## 2017-05-27 VITALS — BP 132/69 | HR 83 | Temp 97.9°F | Resp 20 | Ht 66.0 in | Wt 92.3 lb

## 2017-05-27 DIAGNOSIS — D508 Other iron deficiency anemias: Secondary | ICD-10-CM

## 2017-05-27 DIAGNOSIS — K922 Gastrointestinal hemorrhage, unspecified: Secondary | ICD-10-CM | POA: Diagnosis not present

## 2017-05-27 DIAGNOSIS — D500 Iron deficiency anemia secondary to blood loss (chronic): Secondary | ICD-10-CM

## 2017-05-27 LAB — CBC WITH DIFFERENTIAL/PLATELET
BASO%: 0.6 % (ref 0.0–2.0)
BASOS ABS: 0 10*3/uL (ref 0.0–0.1)
EOS ABS: 0.3 10*3/uL (ref 0.0–0.5)
EOS%: 5.2 % (ref 0.0–7.0)
HCT: 41.1 % (ref 34.8–46.6)
HGB: 13.4 g/dL (ref 11.6–15.9)
LYMPH%: 30.2 % (ref 14.0–49.7)
MCH: 32.4 pg (ref 25.1–34.0)
MCHC: 32.5 g/dL (ref 31.5–36.0)
MCV: 99.7 fL (ref 79.5–101.0)
MONO#: 0.5 10*3/uL (ref 0.1–0.9)
MONO%: 10.3 % (ref 0.0–14.0)
NEUT%: 53.7 % (ref 38.4–76.8)
NEUTROS ABS: 2.8 10*3/uL (ref 1.5–6.5)
PLATELETS: 358 10*3/uL (ref 145–400)
RBC: 4.13 10*6/uL (ref 3.70–5.45)
RDW: 14.3 % (ref 11.2–14.5)
WBC: 5.2 10*3/uL (ref 3.9–10.3)
lymph#: 1.6 10*3/uL (ref 0.9–3.3)

## 2017-05-27 LAB — FERRITIN: FERRITIN: 174 ng/mL (ref 9–269)

## 2017-05-27 NOTE — Telephone Encounter (Signed)
Gave avs and calendar for January - July

## 2017-05-27 NOTE — Progress Notes (Addendum)
  Brittany Hensley   Diagnosis:  Anemia  INTERVAL HISTORY:   Brittany Hensley returns as scheduled. She underwent an upper endoscopy/dilatation procedure 05/20/2017. Swallowing is much improved. She is eating better. She reports a good energy level.  She reports a recent cough. She had a chest x-ray 05/21/2017. The chest x-ray showed COPD changes with a questionable nodular density at the inferior left chest. CT scan recommended for follow-up. Her cough is better. She has never smoked.  Objective:  Vital signs in last 24 hours:  Blood pressure 132/69, pulse 83, temperature 97.9 F (36.6 C), temperature source Oral, resp. rate 20, height 5\' 6"  (1.676 m), weight 92 lb 4.8 oz (41.9 kg), SpO2 100 %.    HEENT: no thrush or ulcers. Lymphatics: no palpable cervical, supra clavicular or axillary lymph nodes. Resp: distant breath sounds. Cardio: regular rate and rhythm. GI: abdomen soft and nontender. No hepatomegaly. No mass. Vascular: no leg edema.    Lab Results:  Lab Results  Component Value Date   WBC 5.2 05/27/2017   HGB 13.4 05/27/2017   HCT 41.1 05/27/2017   MCV 99.7 05/27/2017   PLT 358 05/27/2017   NEUTROABS 2.8 05/27/2017    Imaging:  No results found.  Medications: I have reviewed the patient's current medications.  Assessment/Plan: 1. Chronic anemia secondary to gastrointestinal blood loss and iron deficiency, she last received IV iron 07/01/2016 2. History of kidney stones, followed by Dr. Roni Bread. 3. Gram-negative sepsis and septic shock syndrome April 2008. 4. History of acute renal failure secondary to obstruction and septic shock. 5. History of adrenal insufficiency, no longer on hormone replacement.  6. Questionable allergic to Venofer in June 2010. She reported an elevated blood pressure and "dizziness" on the day following the Venofer therapy. She has tolerated iron dextran without an apparent reaction. 7. History of a left  forearm fracture.  8. Intermittent low-serum bicarbonate level, ? related to diarrhea or renal insufficiency 9. Dysphagia, improved with erythromycin and esophageal dilatation procedures , status post dilatation of the gastric jejunal anastomosis 08/10/2014,02/15/2015, 11/12/2015, 08/10/2016, 05/20/2017 10. Chest x-ray 05/21/2017-COPD changes with questionable nodular density inferior left chest. CT chest scheduled 06/07/2017.   Disposition: Ms. Petron remains stable from a hematologic standpoint. The hemoglobin is in normal range. We will follow-up on the ferritin from today. Plan to administer IV iron as indicated. She will return for labs in 3 months and 6 months. We will arrange for a follow-up visit in 9 months.  She is very anxious about the upcoming chest CT scheduled 06/07/2017. We will try to arrange for this to be done sooner. We will adjust her follow-up appointment accordingly pending the CT result.  Patient seen with Brittany Hensley. Chest x-ray reviewed on the computer with Brittany Hensley.    Brittany Hensley Brittany Hensley   05/27/2017  10:06 AM  This was a shared visit with Brittany Hensley. We reviewed the chest x-ray images with Brittany Hensley. She will proceed with a CT to rule out a lung mass. We will be available to see her as needed pending the CT result. The ferritin level is adequate today.  Brittany Hensley, M.D.

## 2017-05-27 NOTE — Telephone Encounter (Signed)
-----   Message from Owens Shark, NP sent at 05/27/2017  1:48 PM EDT ----- Please let her know ferritin is normal.

## 2017-05-27 NOTE — Telephone Encounter (Signed)
Left message for return call to advise lab results as directed below.

## 2017-05-28 ENCOUNTER — Telehealth: Payer: Self-pay | Admitting: Emergency Medicine

## 2017-05-28 NOTE — Telephone Encounter (Signed)
Returned pts call regarding Brittany Hensley Card note about ferritin being norma. Pt aware of results and states it is ok to leave VM with private information.

## 2017-05-31 ENCOUNTER — Telehealth: Payer: Self-pay | Admitting: *Deleted

## 2017-05-31 NOTE — Telephone Encounter (Signed)
"  I was in last week for ferritin level.  What were the results and could I have all results mailed to my home address.?   Ferritin level = 174.   "Really, that's great."  Confirmed mailing address.  Denies further questions or needs at this time.

## 2017-06-01 ENCOUNTER — Other Ambulatory Visit: Payer: Self-pay | Admitting: Nurse Practitioner

## 2017-06-01 DIAGNOSIS — K219 Gastro-esophageal reflux disease without esophagitis: Secondary | ICD-10-CM

## 2017-06-01 DIAGNOSIS — F411 Generalized anxiety disorder: Secondary | ICD-10-CM

## 2017-06-03 ENCOUNTER — Ambulatory Visit (HOSPITAL_COMMUNITY)
Admission: RE | Admit: 2017-06-03 | Discharge: 2017-06-03 | Disposition: A | Discharge status: Home / Self Care | Payer: PPO | Source: Ambulatory Visit | Attending: Family Medicine | Admitting: Family Medicine

## 2017-06-03 DIAGNOSIS — R918 Other nonspecific abnormal finding of lung field: Secondary | ICD-10-CM | POA: Diagnosis not present

## 2017-06-03 DIAGNOSIS — N200 Calculus of kidney: Secondary | ICD-10-CM | POA: Diagnosis not present

## 2017-06-04 ENCOUNTER — Telehealth: Payer: Self-pay | Admitting: *Deleted

## 2017-06-04 ENCOUNTER — Ambulatory Visit (INDEPENDENT_AMBULATORY_CARE_PROVIDER_SITE_OTHER): Payer: PPO | Admitting: *Deleted

## 2017-06-04 DIAGNOSIS — Z23 Encounter for immunization: Secondary | ICD-10-CM | POA: Diagnosis not present

## 2017-06-04 NOTE — Telephone Encounter (Signed)
Ct shows no evidence of cancer, f/u as scheduled

## 2017-06-04 NOTE — Telephone Encounter (Signed)
Pt called stating she had CT chest done at [facility] radiology last night 06/03/17.  Pt would like to know results by Dr. Benay Spice.  Spoke with pt  and informed her that results will be printed and left on Dr. Gearldine Shown desk for review on Monday since Dr. Benay Spice is not in office today.  Pt voiced understanding. Pt's   Phone    [phone].

## 2017-06-04 NOTE — Progress Notes (Signed)
Pt given Prevnar inj Tolerated well 

## 2017-06-07 ENCOUNTER — Ambulatory Visit (HOSPITAL_COMMUNITY): Payer: PPO

## 2017-06-07 NOTE — Telephone Encounter (Signed)
Pt returned call, notified of CT result per MD note below. She voiced understanding.

## 2017-06-10 ENCOUNTER — Telehealth: Payer: Self-pay

## 2017-06-10 NOTE — Telephone Encounter (Signed)
[facility] to x-ray 

## 2017-06-10 NOTE — Telephone Encounter (Signed)
Pt aware of CT results. 

## 2017-06-29 ENCOUNTER — Other Ambulatory Visit: Payer: Self-pay | Admitting: Nurse Practitioner

## 2017-06-29 DIAGNOSIS — F411 Generalized anxiety disorder: Secondary | ICD-10-CM

## 2017-06-29 NOTE — Telephone Encounter (Signed)
Last seen 05/21/17  MMM  If approved route to nurse to call into [facility]

## 2017-06-29 NOTE — Telephone Encounter (Signed)
Please call in alprazolam with 1 refills 

## 2017-06-29 NOTE — Telephone Encounter (Signed)
[facility]

## 2017-07-01 ENCOUNTER — Encounter: Payer: Self-pay | Admitting: Pediatrics

## 2017-07-01 ENCOUNTER — Ambulatory Visit (INDEPENDENT_AMBULATORY_CARE_PROVIDER_SITE_OTHER): Payer: PPO | Admitting: Pediatrics

## 2017-07-01 VITALS — BP 116/72 | HR 92 | Temp 97.7°F | Resp 20 | Ht 66.0 in | Wt 87.2 lb

## 2017-07-01 DIAGNOSIS — J0100 Acute maxillary sinusitis, unspecified: Secondary | ICD-10-CM | POA: Diagnosis not present

## 2017-07-01 MED ORDER — AMOXICILLIN-POT CLAVULANATE 875-125 MG PO TABS: 1.0000 | ORAL_TABLET | Freq: Two times a day (BID) | ORAL | 0 refills | Status: DC

## 2017-07-01 NOTE — Progress Notes (Signed)
  Subjective:   Patient ID: Brittany Hensley, female    DOB: 02-04-1951, 66 y.o.   MRN: [id number] CC: Nasal Congestion  HPI: Brittany Hensley is a 66 y.o. female presenting for Nasal Congestion  Coughing regularly for the last few weeks Had a chest x-ray 10/19 followed by a CT scan 2 weeks later Showed some likely infectious, inflammatory process Has been treated with azithromycin and methylprednisolone Now most bothered by copious amounts sinus drainage Does have some pressure in her forehead when she leans forward Is coughing up white sputum streaked with yellow Has never needed to be on an albuterol inhaler in the past  No fevers Appetite has been down Feeling achy off and on  Relevant past medical, surgical, family and social history reviewed. Allergies and medications reviewed and updated. Social History   Tobacco Use  Smoking Status Never Smoker  Smokeless Tobacco Never Used   ROS: Per HPI   Objective:    BP 116/72   Pulse 92   Temp 97.7 F (36.5 C) (Oral)   Resp 20   Ht 5\' 6"  (1.676 m)   Wt 87 lb 3.2 oz (39.6 kg)   SpO2 97%   BMI 14.07 kg/m   Wt Readings from Last 3 Encounters:  07/01/17 87 lb 3.2 oz (39.6 kg)  05/27/17 92 lb 4.8 oz (41.9 kg)  05/21/17 92 lb (41.7 kg)  Walking O2 sats 94-96%  Gen: NAD, alert, thin , cooperative with exam, NCAT EYES: EOMI, no conjunctival injection, or no icterus ENT:  TMs obscured bilaterally by cerumen, OP with some cobblestoning posterior pharynx, mild erythema LYMPH: no cervical LAD CV: NRRR, normal S1/S2, no murmur, distal pulses 2+ b/l Resp: CTABL, no wheezes or crackles, normal WOB Neuro: Alert and oriented, strength equal b/l UE and LE, coordination grossly normal  Assessment & Plan:  Tyannah was seen today for nasal congestion.  Diagnoses and all orders for this visit:  Acute non-recurrent maxillary sinusitis Ongoing sinus symptoms, treat with below antibiotic Sinus rinses if able Follow-up in 2 weeks to  reevaluate symptoms and recheck weight -     amoxicillin-clavulanate (AUGMENTIN) 875-125 MG tablet; Take 1 tablet by mouth 2 (two) times daily.   Follow up plan: Return in about 2 weeks (around 07/15/2017). Assunta Found, MD [place]

## 2017-07-05 ENCOUNTER — Telehealth: Payer: Self-pay | Admitting: Nurse Practitioner

## 2017-07-05 NOTE — Telephone Encounter (Signed)
Needs to be seen

## 2017-07-06 ENCOUNTER — Ambulatory Visit (INDEPENDENT_AMBULATORY_CARE_PROVIDER_SITE_OTHER): Payer: PPO | Admitting: Nurse Practitioner

## 2017-07-06 ENCOUNTER — Encounter: Payer: Self-pay | Admitting: Nurse Practitioner

## 2017-07-06 VITALS — BP 123/73 | HR 83 | Temp 97.1°F | Ht 66.0 in | Wt 88.0 lb

## 2017-07-06 DIAGNOSIS — J0100 Acute maxillary sinusitis, unspecified: Secondary | ICD-10-CM | POA: Diagnosis not present

## 2017-07-06 MED ORDER — LEVOFLOXACIN 500 MG PO TABS
500.0000 mg | ORAL_TABLET | Freq: Every day | ORAL | 0 refills | Status: DC
Start: 2017-07-06 — End: 2017-07-16

## 2017-07-06 NOTE — Progress Notes (Signed)
   Subjective:    Patient ID: Brittany Hensley, female    DOB: [birth year], 66 y.o.   MRN: [id number]  HPI Patient come in today c/o blood in stool. She was started on augmentin on 07/01/17 for sinusitis. She thought the blood was coming from augmentin as she stopped taking and she has seen no more blood since stopping augmentin. She is still congested and SOB and feels like she needs a different antibiotic.    Review of Systems  Constitutional: Positive for appetite change (dcreased). Negative for chills and fever.  HENT: Positive for congestion and rhinorrhea. Negative for ear pain, sore throat and trouble swallowing.   Respiratory: Positive for cough (productive).   Cardiovascular: Negative.   Genitourinary: Negative.   Neurological: Negative.   Psychiatric/Behavioral: Negative.   All other systems reviewed and are negative.      Objective:   Physical Exam  Constitutional: She appears well-developed and well-nourished. No distress.  HENT:  Right Ear: External ear normal.  Left Ear: External ear normal.  Nose: Nose normal.  Mouth/Throat: Oropharynx is clear and moist.  Neck: Normal range of motion.  Cardiovascular: Normal rate and regular rhythm.  Pulmonary/Chest: Effort normal and breath sounds normal.  Neurological: She is alert.  Skin: Skin is warm.  Psychiatric: She has a normal mood and affect. Her behavior is normal. Judgment and thought content normal.   BP 123/73   Pulse 83   Temp (!) 97.1 F (36.2 C) (Oral)   Ht 5\' 6"  (1.676 m)   Wt 88 lb (39.9 kg)   BMI 14.20 kg/m         Assessment & Plan:   1. Acute non-recurrent maxillary sinusitis    Meds ordered this encounter  Medications  . levofloxacin (LEVAQUIN) 500 MG tablet    Sig: Take 1 tablet (500 mg total) by mouth daily.    Dispense:  7 tablet    Refill:  0    Order Specific Question:   Supervising Provider    Answer:   VINCENT, CAROL L [[id number]]   1. Take meds as prescribed 2. Use a cool mist  humidifier especially during the winter months and when heat has been humid. 3. Use saline nose sprays frequently 4. Saline irrigations of the nose can be very helpful if done frequently.  * 4X daily for 1 week*  * Use of a nettie pot can be helpful with this. Follow directions with this* 5. Drink plenty of fluids 6. Keep thermostat turn down low 7.For any cough or congestion  Use plain Mucinex- regular strength or max strength is fine   * Children- consult with Pharmacist for dosing 8. For fever or aces or pains- take tylenol or ibuprofen appropriate for age and weight.  * for fevers greater than 101 orally you may alternate ibuprofen and tylenol every  3 hours.   Mary-Margaret Hassell Done, FNP

## 2017-07-06 NOTE — Patient Instructions (Signed)

## 2017-07-14 ENCOUNTER — Telehealth: Payer: Self-pay | Admitting: Nurse Practitioner

## 2017-07-14 NOTE — Telephone Encounter (Signed)
Patient aware and verbalizes understanding. States she wants to speak to Leafy Ro- states she knows what is going on with her sickness.

## 2017-07-14 NOTE — Telephone Encounter (Signed)
Patient was actually on the extended course of Levaquin, as typical dose is 5 days.  Refill not appropriate.  Please inform patient that if she is worried about persistent infection, she should be seen, as this may require referral to ED for IV antibiotics.

## 2017-07-14 NOTE — Telephone Encounter (Signed)
Patient states that she still is having some chest congestion but it is a lot better since being on the Levaquin. Wanting to know if she can be on it another week and then come in to be seen if it does not go away. Covering PCP- please advise.

## 2017-07-15 NOTE — Telephone Encounter (Signed)
Will you please review and advise

## 2017-07-15 NOTE — Telephone Encounter (Signed)
Has appt with MMM tomorrow

## 2017-07-15 NOTE — Telephone Encounter (Signed)
Should not need refill on levaquin

## 2017-07-16 ENCOUNTER — Ambulatory Visit: Payer: PPO | Admitting: Nurse Practitioner

## 2017-07-16 ENCOUNTER — Encounter: Payer: Self-pay | Admitting: Nurse Practitioner

## 2017-07-16 ENCOUNTER — Ambulatory Visit (INDEPENDENT_AMBULATORY_CARE_PROVIDER_SITE_OTHER): Payer: PPO | Admitting: Nurse Practitioner

## 2017-07-16 VITALS — BP 94/58 | HR 81 | Temp 96.8°F | Ht 66.0 in | Wt 88.0 lb

## 2017-07-16 DIAGNOSIS — J40 Bronchitis, not specified as acute or chronic: Secondary | ICD-10-CM

## 2017-07-16 MED ORDER — LEVOFLOXACIN 500 MG PO TABS: 500.0000 mg | ORAL_TABLET | Freq: Every day | ORAL | 0 refills | Status: DC

## 2017-07-16 NOTE — Progress Notes (Signed)
   Subjective:    Patient ID: Brittany Hensley, female    DOB: [birth year], 66 y.o.   MRN: [id number]  HPI Patient was seen on December 4 with c/o blood in her stool, which had resolved before she came in. AT visit sh was congested and sob. Was treated For sinusitis with levaquin. SHe is better but not completely better. She is still coughing some.    Review of Systems  Constitutional: Positive for appetite change. Negative for chills and fever.  HENT: Positive for congestion and postnasal drip. Negative for sore throat and trouble swallowing.   Respiratory: Positive for cough (productive). Negative for shortness of breath.   Cardiovascular: Negative.   Gastrointestinal: Negative.   Genitourinary: Negative.   Neurological: Positive for headaches.  Psychiatric/Behavioral: Negative.   All other systems reviewed and are negative.      Objective:   Physical Exam  Constitutional: She is oriented to person, place, and time. She appears well-developed and well-nourished. No distress.  HENT:  Right Ear: Hearing, tympanic membrane, external ear and ear canal normal.  Left Ear: Hearing, tympanic membrane, external ear and ear canal normal.  Nose: Mucosal edema and rhinorrhea present. Right sinus exhibits no maxillary sinus tenderness and no frontal sinus tenderness. Left sinus exhibits no maxillary sinus tenderness and no frontal sinus tenderness.  Mouth/Throat: Uvula is midline, oropharynx is clear and moist and mucous membranes are normal.  Neck: Normal range of motion. Neck supple.  Cardiovascular: Normal rate and regular rhythm.  Pulmonary/Chest: Effort normal and breath sounds normal. No respiratory distress. She has no wheezes.  Deep wet cough  Lymphadenopathy:    She has no cervical adenopathy.  Neurological: She is alert and oriented to person, place, and time.  Psychiatric: She has a normal mood and affect. Her behavior is normal. Judgment and thought content normal.   BP (!) 94/58    Pulse 81   Temp (!) 96.8 F (36 C) (Oral)   Ht 5\' 6"  (1.676 m)   Wt 88 lb (39.9 kg)   BMI 14.20 kg/m      Assessment & Plan:  1. Bronchitis 1. Take meds as prescribed 2. Use a cool mist humidifier especially during the winter months and when heat has been humid. 3. Use saline nose sprays frequently 4. Saline irrigations of the nose can be very helpful if done frequently.  * 4X daily for 1 week*  * Use of a nettie pot can be helpful with this. Follow directions with this* 5. Drink plenty of fluids 6. Keep thermostat turn down low 7.For any cough or congestion  Use plain Mucinex- regular strength or max strength is fine   * Children- consult with Pharmacist for dosing 8. For fever or aces or pains- take tylenol or ibuprofen appropriate for age and weight.  * for fevers greater than 101 orally you may alternate ibuprofen and tylenol every  3 hours.   Meds ordered this encounter  Medications  . levofloxacin (LEVAQUIN) 500 MG tablet    Sig: Take 1 tablet (500 mg total) by mouth daily.    Dispense:  7 tablet    Refill:  0    Order Specific Question:   Supervising Provider    Answer:   Eustaquio Maize [[id number]]   Mary-Margaret Hassell Done, FNP

## 2017-07-16 NOTE — Patient Instructions (Signed)

## 2017-07-27 ENCOUNTER — Other Ambulatory Visit: Payer: Self-pay | Admitting: Nurse Practitioner

## 2017-07-27 DIAGNOSIS — L719 Rosacea, unspecified: Secondary | ICD-10-CM

## 2017-08-12 ENCOUNTER — Encounter: Payer: Self-pay | Admitting: Nurse Practitioner

## 2017-08-12 ENCOUNTER — Ambulatory Visit (INDEPENDENT_AMBULATORY_CARE_PROVIDER_SITE_OTHER): Payer: PPO | Admitting: Nurse Practitioner

## 2017-08-12 VITALS — BP 119/71 | HR 74 | Temp 97.2°F | Ht 66.0 in | Wt 89.0 lb

## 2017-08-12 DIAGNOSIS — E034 Atrophy of thyroid (acquired): Secondary | ICD-10-CM

## 2017-08-12 DIAGNOSIS — K311 Adult hypertrophic pyloric stenosis: Secondary | ICD-10-CM | POA: Diagnosis not present

## 2017-08-12 DIAGNOSIS — D508 Other iron deficiency anemias: Secondary | ICD-10-CM

## 2017-08-12 DIAGNOSIS — F411 Generalized anxiety disorder: Secondary | ICD-10-CM

## 2017-08-12 DIAGNOSIS — K219 Gastro-esophageal reflux disease without esophagitis: Secondary | ICD-10-CM

## 2017-08-12 DIAGNOSIS — L719 Rosacea, unspecified: Secondary | ICD-10-CM | POA: Diagnosis not present

## 2017-08-12 DIAGNOSIS — Z681 Body mass index (BMI) 19 or less, adult: Secondary | ICD-10-CM | POA: Diagnosis not present

## 2017-08-12 DIAGNOSIS — Z903 Acquired absence of stomach [part of]: Secondary | ICD-10-CM

## 2017-08-12 DIAGNOSIS — K912 Postsurgical malabsorption, not elsewhere classified: Secondary | ICD-10-CM

## 2017-08-12 MED ORDER — OMEPRAZOLE 40 MG PO CPDR: 40.0000 mg | DELAYED_RELEASE_CAPSULE | Freq: Every day | ORAL | 1 refills | Status: DC

## 2017-08-12 MED ORDER — LEVOTHYROXINE SODIUM 100 MCG PO TABS: 100.0000 ug | ORAL_TABLET | Freq: Every morning | ORAL | 1 refills | Status: DC

## 2017-08-12 MED ORDER — SERTRALINE HCL 100 MG PO TABS: 100.0000 mg | ORAL_TABLET | Freq: Every day | ORAL | 1 refills | Status: DC

## 2017-08-12 MED ORDER — METRONIDAZOLE 0.75 % EX GEL: Freq: Every day | CUTANEOUS | 2 refills | Status: DC

## 2017-08-12 MED ORDER — ALPRAZOLAM 0.5 MG PO TABS: 0.5000 mg | ORAL_TABLET | Freq: Four times a day (QID) | ORAL | 2 refills | Status: DC | PRN

## 2017-08-12 NOTE — Patient Instructions (Signed)
 High-Protein and High-Calorie Diet Eating high-protein and high-calorie foods can help you to gain weight, heal after an injury, and recover after an illness or surgery. What is my plan? The specific amount of daily protein and calories you need depends on:  Your body weight.  The reason this diet is recommended for you.  Generally, a high-protein, high-calorie diet involves:  Eating 250-500 extra calories each day.  Making sure that 10-35% of your daily calories come from protein.  Talk to your health care provider about how much protein and how many calories you need each day. Follow the diet as directed by your health care provider. What do I need to know about this diet?  Ask your health care provider if you should take a nutritional supplement.  Try to eat six small meals each day instead of three large meals.  Eat a balanced diet, including one food that is high in protein at each meal.  Keep nutritious snacks handy, such as nuts, trail mixes, dried fruit, and yogurt.  If you have kidney disease or diabetes, eating too much protein may put extra stress on your kidneys. Talk to your health care provider if you have either of those conditions. What are some high-protein foods? Grains Quinoa. Bulgur wheat. Vegetables Soybeans. Peas. Meats and Other Protein Sources Beef, pork, and poultry. Fish and seafood. Eggs. Tofu. Textured vegetable protein (TVP). Peanut butter. Nuts and seeds. Dried beans. Protein powders. Dairy Whole milk. Whole-milk yogurt. Powdered milk. Cheese. Cottage Cheese. Eggnog. Beverages High-protein supplement drinks. Soy milk. Other Protein bars. The items listed above may not be a complete list of recommended foods or beverages. Contact your dietitian for more options. What are some high-calorie foods? Grains Pasta. Quick breads. Muffins. Pancakes. Ready-to-eat cereal. Vegetables Vegetables cooked in oil or butter. Fried potatoes. Fruits Dried  fruit. Fruit leather. Canned fruit in syrup. Fruit juice. Avocados. Meats and Other Protein Sources Peanut butter. Nuts and seeds. Dairy Heavy cream. Whipped cream. Cream cheese. Sour cream. Ice cream. Custard. Pudding. Beverages Meal-replacement beverages. Nutrition shakes. Fruit juice. Sugar-sweetened soft drinks. Condiments Salad dressing. Mayonnaise. Alfredo sauce. Fruit preserves or jelly. Honey. Syrup. Sweets/Desserts Cake. Cookies. Pie. Pastries. Candy bars. Chocolate. Fats and Oils Butter or margarine. Oil. Gravy. Other Meal-replacement bars. The items listed above may not be a complete list of recommended foods or beverages. Contact your dietitian for more options. What are some tips for including high-protein and high-calorie foods in my diet?  Add whole milk, half-and-half, or heavy cream to cereal, pudding, soup, or hot cocoa.  Add whole milk to instant breakfast drinks.  Add peanut butter to oatmeal or smoothies.  Add powdered milk to baked goods, smoothies, or milkshakes.  Add powdered milk, cream, or butter to mashed potatoes.  Add cheese to cooked vegetables.  Make whole-milk yogurt parfaits. Top them with granola, fruit, or nuts.  Add cottage cheese to your fruit.  Add avocados, cheese, or both to sandwiches or salads.  Add meat, poultry, or seafood to rice, pasta, casseroles, salads, and soups.  Use mayonnaise when making egg salad, chicken salad, or tuna salad.  Use peanut butter as a topping for pretzels, celery, or crackers.  Add beans to casseroles, dips, and spreads.  Add pureed beans to sauces and soups.  Replace calorie-free drinks with calorie-containing drinks, such as milk and fruit juice. This information is not intended to replace advice given to you by your health care provider. Make sure you discuss any questions you have with your   health care provider. Document Released: 07/20/2005 Document Revised: 12/26/2015 Document Reviewed:  01/02/2014 Elsevier Interactive Patient Education  2018 Elsevier Inc.  

## 2017-08-12 NOTE — Progress Notes (Signed)
Subjective:    Patient ID: Brittany Hensley, female    DOB: 23-Mar-1951, 67 y.o.   MRN: [id number]  HPI   Brittany Hensley is here today for follow up of chronic medical problem.  Outpatient Encounter Medications as of 08/12/2017  Medication Sig  . acetaminophen (TYLENOL) 500 MG tablet Take 1,000 mg by mouth 2 (two) times daily.  Marland Kitchen ALPRAZolam (XANAX) 0.5 MG tablet TAKE ONE TABLET FOUR TIMES DAILY  . Ascorbic Acid (VITAMIN C) 500 MG CHEW Chew 500 mg by mouth 2 (two) times daily.  Marland Kitchen CALCIUM CITRATE PO Take 600 mg by mouth 2 (two) times daily.   . Chlorphen-PE-Acetaminophen 4-10-325 MG TABS Take 1 tablet by mouth every 6 (six) hours as needed.  . cholecalciferol (VITAMIN D) 1000 units tablet Take 1,000 Units by mouth 2 (two) times daily.  . Flaxseed, Linseed, (FLAXSEED OIL) 1200 MG CAPS Take 1,200 mg by mouth daily with lunch.  . levothyroxine (SYNTHROID, LEVOTHROID) 100 MCG tablet TAKE ONE TABLET EVERY MORNING  . loratadine (CLARITIN) 10 MG tablet Take 10 mg by mouth 2 (two) times daily.    . magnesium gluconate (MAGONATE) 500 MG tablet Take 500 mg by mouth daily with lunch.   . metroNIDAZOLE (METROGEL) 0.75 % gel APPLY AT BEDTIME  . Multiple Vitamins-Minerals (CENTRUM SILVER) CHEW Chew 1 tablet by mouth 2 (two) times daily.  Marland Kitchen omeprazole (PRILOSEC) 40 MG capsule TAKE ONE (1) CAPSULE EACH DAY  . Probiotic Product (PROBIOTIC PO) Take 1 capsule by mouth daily. 2 BILLION  . sertraline (ZOLOFT) 100 MG tablet TAKE ONE TABLET DAILY AT BEDTIME  . sucralfate (CARAFATE) 1 G tablet Take 1 g by mouth 3 (three) times daily.     1. Gastric outlet obstruction  Doing ok but decreases her appetite  2. Gastroesophageal reflux disease without esophagitis  Takes omeprazole daily- very symptomatic if does not take  3. Postgastrectomy malabsorption  Has had for years- nothing much they can do about it  4. Hypothyroidism due to acquired atrophy of thyroid  No problems that patient is awareof  5. Other iron  deficiency anemia  needs to have labs drawn today  6. BMI less than 19,adult  No recent weight changes  7. GAD (generalized anxiety disorder)  Patient is under a lot of stress and takes xanax QID. She is also on zoloft that helps some.  8.      [place]          metrogel seems to keep under control  New complaints: None today  Social history: Under a lot of stress with house that she is renting and family will not pay rent. But they recently paid mortgage and she is fre and clear from that rental property   Review of Systems  Constitutional: Negative for activity change and appetite change.  HENT: Negative.   Eyes: Negative for pain.  Respiratory: Negative for shortness of breath.   Cardiovascular: Negative for chest pain, palpitations and leg swelling.  Gastrointestinal: Negative for abdominal pain.  Endocrine: Negative for polydipsia.  Genitourinary: Negative.   Skin: Negative for rash.  Neurological: Negative for dizziness, weakness and headaches.  Hematological: Does not bruise/bleed easily.  Psychiatric/Behavioral: Negative.   All other systems reviewed and are negative.      Objective:   Physical Exam  Constitutional: She is oriented to person, place, and time. She appears well-developed and well-nourished.  HENT:  Nose: Nose normal.  Mouth/Throat: Oropharynx is clear and moist.  Eyes: EOM are normal.  Neck: Trachea normal, normal range of motion and full passive range of motion without pain. Neck supple. No JVD present. Carotid bruit is not present. No thyromegaly present.  Cardiovascular: Normal rate, regular rhythm, normal heart sounds and intact distal pulses. Exam reveals no gallop and no friction rub.  No murmur heard. Pulmonary/Chest: Effort normal and breath sounds normal.  Abdominal: Soft. Bowel sounds are normal. She exhibits no distension and no mass. There is no tenderness.  Musculoskeletal: Normal range of motion.  Lymphadenopathy:    She has no  cervical adenopathy.  Neurological: She is alert and oriented to person, place, and time. She has normal reflexes.  Skin: Skin is warm and dry.  Psychiatric: She has a normal mood and affect. Her behavior is normal. Judgment and thought content normal.   BP 119/71   Pulse 74   Temp (!) 97.2 F (36.2 C) (Oral)   Ht 5' 6"  (1.676 m)   Wt 89 lb (40.4 kg)   BMI 14.36 kg/m         Assessment & Plan:  1. Gastric outlet obstruction Eat 6 small meals a day rather than 3 large meals  2. Gastroesophageal reflux disease without esophagitis Avoid spicy foods Do not eat 2 hours prior to bedtime - omeprazole (PRILOSEC) 40 MG capsule; Take 1 capsule (40 mg total) by mouth daily.  Dispense: 90 capsule; Refill: 1  3. Postgastrectomy malabsorption  4. Hypothyroidism due to acquired atrophy of thyroid - levothyroxine (SYNTHROID, LEVOTHROID) 100 MCG tablet; Take 1 tablet (100 mcg total) by mouth every morning.  Dispense: 90 tablet; Refill: 1 - CMP14+EGFR - Lipid panel - Thyroid Panel With TSH  5. Other iron deficiency anemia Labs pending - Anemia Profile B  6. BMI less than 19,adult Increase calories in diet  7. GAD (generalized anxiety disorder) Stress management Please try to wean down to xanax TID by next visit - sertraline (ZOLOFT) 100 MG tablet; Take 1 tablet (100 mg total) by mouth at bedtime.  Dispense: 90 tablet; Refill: 1 - ALPRAZolam (XANAX) 0.5 MG tablet; Take 1 tablet (0.5 mg total) by mouth 4 (four) times daily as needed for anxiety.  Dispense: 120 tablet; Refill: 2  8. Rosacea - metroNIDAZOLE (METROGEL) 0.75 % gel; Apply topically at bedtime.  Dispense: 45 g; Refill: 2    Labs pending Health maintenance reviewed Diet and exercise encouraged Continue all meds Follow up  In 3 months   [place], FNP

## 2017-08-27 ENCOUNTER — Inpatient Hospital Stay: Payer: PPO | Attending: Oncology

## 2017-08-27 DIAGNOSIS — D508 Other iron deficiency anemias: Secondary | ICD-10-CM

## 2017-08-27 DIAGNOSIS — K922 Gastrointestinal hemorrhage, unspecified: Secondary | ICD-10-CM | POA: Diagnosis not present

## 2017-08-27 DIAGNOSIS — D500 Iron deficiency anemia secondary to blood loss (chronic): Secondary | ICD-10-CM | POA: Insufficient documentation

## 2017-08-27 LAB — FERRITIN: Ferritin: 89 ng/mL (ref 9–269)

## 2017-08-27 LAB — CBC WITH DIFFERENTIAL/PLATELET
BASOS ABS: 0.1 10*3/uL (ref 0.0–0.1)
BASOS PCT: 1 %
EOS ABS: 0.1 10*3/uL (ref 0.0–0.5)
Eosinophils Relative: 2 %
HEMATOCRIT: 39.1 % (ref 34.8–46.6)
Hemoglobin: 12.3 g/dL (ref 11.6–15.9)
Lymphocytes Relative: 22 %
Lymphs Abs: 1.2 10*3/uL (ref 0.9–3.3)
MCH: 33.2 pg (ref 25.1–34.0)
MCHC: 31.5 g/dL (ref 31.5–36.0)
MCV: 105.2 fL — ABNORMAL HIGH (ref 79.5–101.0)
MONO ABS: 0.4 10*3/uL (ref 0.1–0.9)
Monocytes Relative: 7 %
NEUTROS ABS: 3.7 10*3/uL (ref 1.5–6.5)
NEUTROS PCT: 68 %
Platelets: 359 10*3/uL (ref 145–400)
RBC: 3.72 MIL/uL (ref 3.70–5.45)
RDW: 16.9 % — AB (ref 11.2–16.1)
WBC: 5.5 10*3/uL (ref 3.9–10.3)

## 2017-09-01 ENCOUNTER — Telehealth: Payer: Self-pay | Admitting: Emergency Medicine

## 2017-09-01 NOTE — Telephone Encounter (Addendum)
Left Vm to call back regarding this note.    ----- Message from Ladell Pier, MD sent at 08/31/2017  6:34 PM EST ----- Please call patient, hemoglobin and ferritin are normal, follow-up as scheduled

## 2017-09-01 NOTE — Telephone Encounter (Signed)
Patient returned this nurses call regarding last note. Pt verbalized understanding of labs WNL. Next appt date and time given to pt.

## 2017-11-11 ENCOUNTER — Encounter: Payer: Self-pay | Admitting: Nurse Practitioner

## 2017-11-11 ENCOUNTER — Ambulatory Visit (INDEPENDENT_AMBULATORY_CARE_PROVIDER_SITE_OTHER): Payer: PPO | Admitting: Nurse Practitioner

## 2017-11-11 VITALS — BP 117/71 | HR 72 | Temp 96.8°F | Ht 66.0 in | Wt 88.0 lb

## 2017-11-11 DIAGNOSIS — F411 Generalized anxiety disorder: Secondary | ICD-10-CM

## 2017-11-11 DIAGNOSIS — Z903 Acquired absence of stomach [part of]: Secondary | ICD-10-CM | POA: Diagnosis not present

## 2017-11-11 DIAGNOSIS — I471 Supraventricular tachycardia: Secondary | ICD-10-CM

## 2017-11-11 DIAGNOSIS — E034 Atrophy of thyroid (acquired): Secondary | ICD-10-CM | POA: Diagnosis not present

## 2017-11-11 DIAGNOSIS — K912 Postsurgical malabsorption, not elsewhere classified: Secondary | ICD-10-CM | POA: Diagnosis not present

## 2017-11-11 DIAGNOSIS — K219 Gastro-esophageal reflux disease without esophagitis: Secondary | ICD-10-CM | POA: Diagnosis not present

## 2017-11-11 DIAGNOSIS — D508 Other iron deficiency anemias: Secondary | ICD-10-CM | POA: Diagnosis not present

## 2017-11-11 DIAGNOSIS — Z681 Body mass index (BMI) 19 or less, adult: Secondary | ICD-10-CM | POA: Diagnosis not present

## 2017-11-11 DIAGNOSIS — L719 Rosacea, unspecified: Secondary | ICD-10-CM

## 2017-11-11 MED ORDER — ALPRAZOLAM 0.5 MG PO TABS: 0.5000 mg | ORAL_TABLET | Freq: Four times a day (QID) | ORAL | 2 refills | Status: DC | PRN

## 2017-11-11 MED ORDER — LEVOTHYROXINE SODIUM 100 MCG PO TABS: 100.0000 ug | ORAL_TABLET | Freq: Every morning | ORAL | 1 refills | Status: DC

## 2017-11-11 MED ORDER — OMEPRAZOLE 40 MG PO CPDR: 40.0000 mg | DELAYED_RELEASE_CAPSULE | Freq: Every day | ORAL | 1 refills | Status: DC

## 2017-11-11 MED ORDER — SERTRALINE HCL 100 MG PO TABS: 100.0000 mg | ORAL_TABLET | Freq: Every day | ORAL | 1 refills | Status: DC

## 2017-11-11 MED ORDER — SERTRALINE HCL 100 MG PO TABS: 200.0000 mg | ORAL_TABLET | Freq: Every day | ORAL | 5 refills | Status: DC

## 2017-11-11 NOTE — Progress Notes (Signed)
Subjective:    Patient ID: Brittany Hensley, female    DOB: 20-Feb-1951, 67 y.o.   MRN: [id number]  HPI  Brittany Hensley is here today for follow up of chronic medical problem.  Outpatient Encounter Medications as of 11/11/2017  Medication Sig  . acetaminophen (TYLENOL) 500 MG tablet Take 1,000 mg by mouth 2 (two) times daily.  Marland Kitchen ALPRAZolam (XANAX) 0.5 MG tablet Take 1 tablet (0.5 mg total) by mouth 4 (four) times daily as needed for anxiety.  . Ascorbic Acid (VITAMIN C) 500 MG CHEW Chew 500 mg by mouth 2 (two) times daily.  Marland Kitchen CALCIUM CITRATE PO Take 600 mg by mouth 2 (two) times daily.   . Chlorphen-PE-Acetaminophen 4-10-325 MG TABS Take 1 tablet by mouth every 6 (six) hours as needed.  . cholecalciferol (VITAMIN D) 1000 units tablet Take 1,000 Units by mouth 2 (two) times daily.  . Flaxseed, Linseed, (FLAXSEED OIL) 1200 MG CAPS Take 1,200 mg by mouth daily with lunch.  . levothyroxine (SYNTHROID, LEVOTHROID) 100 MCG tablet Take 1 tablet (100 mcg total) by mouth every morning.  . loratadine (CLARITIN) 10 MG tablet Take 10 mg by mouth 2 (two) times daily.    . magnesium gluconate (MAGONATE) 500 MG tablet Take 500 mg by mouth daily with lunch.   . Multiple Vitamins-Minerals (CENTRUM SILVER) CHEW Chew 1 tablet by mouth 2 (two) times daily.  Marland Kitchen omeprazole (PRILOSEC) 40 MG capsule Take 1 capsule (40 mg total) by mouth daily.  . Probiotic Product (PROBIOTIC PO) Take 1 capsule by mouth daily. 2 BILLION  . sertraline (ZOLOFT) 100 MG tablet Take 1 tablet (100 mg total) by mouth at bedtime.  . sucralfate (CARAFATE) 1 G tablet Take 1 g by mouth 3 (three) times daily.      1. Postgastrectomy malabsorption  It is very difficult for her to eat to much. She does drink premier shakes   2. Gastroesophageal reflux disease without esophagitis  Takes omeprazole daily to keep from having symptoms  3. Hypothyroidism due to acquired atrophy of thyroid  No problems that she is aware of.  4. Rosacea  Uses  metrogel as needed. Has not had any recent flare ups  5. Other iron deficiency anemia  Patient stays very pale. Needs lab work today.   6. GAD (generalized anxiety disorder)  Take xanax daily  7. BMI less than 19,adult  No recent weight loss  8. Atrioventricular nodal re-entry tachycardia ([place])  No recent tachycardiac events-has not seen cardiologist for awhile.    New complaints: None today  Social history: Lives by herself. sons frequently   Review of Systems  Constitutional: Negative for activity change and appetite change.  HENT: Negative.   Eyes: Negative for pain.  Respiratory: Negative for shortness of breath.   Cardiovascular: Negative for chest pain, palpitations and leg swelling.  Gastrointestinal: Negative for abdominal pain.  Endocrine: Negative for polydipsia.  Genitourinary: Negative.   Skin: Negative for rash.  Neurological: Negative for dizziness, weakness and headaches.  Hematological: Does not bruise/bleed easily.  Psychiatric/Behavioral: Negative.   All other systems reviewed and are negative.      Objective:   Physical Exam  Constitutional: She is oriented to person, place, and time. She appears well-developed and well-nourished.  HENT:  Nose: Nose normal.  Mouth/Throat: Oropharynx is clear and moist.  Eyes: EOM are normal.  Neck: Trachea normal, normal range of motion and full passive range of motion without pain. Neck supple. No JVD present. Carotid bruit is  not present. No thyromegaly present.  Cardiovascular: Normal rate, regular rhythm, normal heart sounds and intact distal pulses. Exam reveals no gallop and no friction rub.  No murmur heard. Pulmonary/Chest: Effort normal and breath sounds normal.  Abdominal: Soft. Bowel sounds are normal. She exhibits no distension and no mass. There is no tenderness.  Musculoskeletal: Normal range of motion.  Lymphadenopathy:    She has no cervical adenopathy.  Neurological: She is alert and oriented to  person, place, and time. She has normal reflexes.  Skin: Skin is warm and dry.  Psychiatric: She has a normal mood and affect. Her behavior is normal. Judgment and thought content normal.   BP 117/71   Pulse 72   Temp (!) 96.8 F (36 C) (Oral)   Ht 5\' 6"  (1.676 m)   Wt 88 lb (39.9 kg)   BMI 14.20 kg/m        Assessment & Plan:  1. Postgastrectomy malabsorption Encouraged to continue with protein shakes  2. Gastroesophageal reflux disease without esophagitis Avoid spicy foods Do not eat 2 hours prior to bedtime - omeprazole (PRILOSEC) 40 MG capsule; Take 1 capsule (40 mg total) by mouth daily.  Dispense: 90 capsule; Refill: 1  3. Hypothyroidism due to acquired atrophy of thyroid - levothyroxine (SYNTHROID, LEVOTHROID) 100 MCG tablet; Take 1 tablet (100 mcg total) by mouth every morning.  Dispense: 90 tablet; Refill: 1  4. Rosacea Patient will let me know what treatment insurance will pay for.  5. Other iron deficiency anemia Labs pending  6. GAD (generalized anxiety disorder) Stress management - sertraline (ZOLOFT) 100 MG tablet; Take 1 tablet (100 mg total) by mouth at bedtime.  Dispense: 90 tablet; Refill: 1 - ALPRAZolam (XANAX) 0.5 MG tablet; Take 1 tablet (0.5 mg total) by mouth 4 (four) times daily as needed for anxiety.  Dispense: 120 tablet; Refill: 2  7. BMI less than 19,adult Again continue protien  8. Atrioventricular nodal re-entry tachycardia ([place]) Keep diary if episodes occur    Labs pending Health maintenance reviewed Diet and exercise encouraged Continue all meds Follow up  In 3 months   [place], FNP

## 2017-11-11 NOTE — Addendum Note (Signed)
Addended by: Chevis Pretty on: 11/11/2017 10:45 AM   Modules accepted: Orders

## 2017-11-11 NOTE — Addendum Note (Signed)
Addended by: Rolena Infante on: 11/11/2017 11:07 AM   Modules accepted: Orders

## 2017-11-11 NOTE — Patient Instructions (Signed)
 High-Protein and High-Calorie Diet Eating high-protein and high-calorie foods can help you to gain weight, heal after an injury, and recover after an illness or surgery. What is my plan? The specific amount of daily protein and calories you need depends on:  Your body weight.  The reason this diet is recommended for you.  Generally, a high-protein, high-calorie diet involves:  Eating 250-500 extra calories each day.  Making sure that 10-35% of your daily calories come from protein.  Talk to your health care provider about how much protein and how many calories you need each day. Follow the diet as directed by your health care provider. What do I need to know about this diet?  Ask your health care provider if you should take a nutritional supplement.  Try to eat six small meals each day instead of three large meals.  Eat a balanced diet, including one food that is high in protein at each meal.  Keep nutritious snacks handy, such as nuts, trail mixes, dried fruit, and yogurt.  If you have kidney disease or diabetes, eating too much protein may put extra stress on your kidneys. Talk to your health care provider if you have either of those conditions. What are some high-protein foods? Grains Quinoa. Bulgur wheat. Vegetables Soybeans. Peas. Meats and Other Protein Sources Beef, pork, and poultry. Fish and seafood. Eggs. Tofu. Textured vegetable protein (TVP). Peanut butter. Nuts and seeds. Dried beans. Protein powders. Dairy Whole milk. Whole-milk yogurt. Powdered milk. Cheese. Cottage Cheese. Eggnog. Beverages High-protein supplement drinks. Soy milk. Other Protein bars. The items listed above may not be a complete list of recommended foods or beverages. Contact your dietitian for more options. What are some high-calorie foods? Grains Pasta. Quick breads. Muffins. Pancakes. Ready-to-eat cereal. Vegetables Vegetables cooked in oil or butter. Fried potatoes. Fruits Dried  fruit. Fruit leather. Canned fruit in syrup. Fruit juice. Avocados. Meats and Other Protein Sources Peanut butter. Nuts and seeds. Dairy Heavy cream. Whipped cream. Cream cheese. Sour cream. Ice cream. Custard. Pudding. Beverages Meal-replacement beverages. Nutrition shakes. Fruit juice. Sugar-sweetened soft drinks. Condiments Salad dressing. Mayonnaise. Alfredo sauce. Fruit preserves or jelly. Honey. Syrup. Sweets/Desserts Cake. Cookies. Pie. Pastries. Candy bars. Chocolate. Fats and Oils Butter or margarine. Oil. Gravy. Other Meal-replacement bars. The items listed above may not be a complete list of recommended foods or beverages. Contact your dietitian for more options. What are some tips for including high-protein and high-calorie foods in my diet?  Add whole milk, half-and-half, or heavy cream to cereal, pudding, soup, or hot cocoa.  Add whole milk to instant breakfast drinks.  Add peanut butter to oatmeal or smoothies.  Add powdered milk to baked goods, smoothies, or milkshakes.  Add powdered milk, cream, or butter to mashed potatoes.  Add cheese to cooked vegetables.  Make whole-milk yogurt parfaits. Top them with granola, fruit, or nuts.  Add cottage cheese to your fruit.  Add avocados, cheese, or both to sandwiches or salads.  Add meat, poultry, or seafood to rice, pasta, casseroles, salads, and soups.  Use mayonnaise when making egg salad, chicken salad, or tuna salad.  Use peanut butter as a topping for pretzels, celery, or crackers.  Add beans to casseroles, dips, and spreads.  Add pureed beans to sauces and soups.  Replace calorie-free drinks with calorie-containing drinks, such as milk and fruit juice. This information is not intended to replace advice given to you by your health care provider. Make sure you discuss any questions you have with your   health care provider. Document Released: 07/20/2005 Document Revised: 12/26/2015 Document Reviewed:  01/02/2014 Elsevier Interactive Patient Education  2018 Elsevier Inc.  

## 2017-11-12 LAB — ANEMIA PROFILE B
Basophils Absolute: 0 10*3/uL (ref 0.0–0.2)
Basos: 1 %
EOS (ABSOLUTE): 0.1 10*3/uL (ref 0.0–0.4)
Eos: 3 %
FERRITIN: 137 ng/mL (ref 15–150)
FOLATE: 5 ng/mL (ref >3.0)
HEMATOCRIT: 37.8 % (ref 34.0–46.6)
Hemoglobin: 12.3 g/dL (ref 11.1–15.9)
Immature Grans (Abs): 0 10*3/uL (ref 0.0–0.1)
Immature Granulocytes: 0 %
Iron Saturation: 28 % (ref 15–55)
Iron: 60 ug/dL (ref 27–139)
LYMPHS ABS: 1.2 10*3/uL (ref 0.7–3.1)
Lymphs: 26 %
MCH: 32.5 pg (ref 26.6–33.0)
MCHC: 32.5 g/dL (ref 31.5–35.7)
MCV: 100 fL — AB (ref 79–97)
MONOCYTES: 11 %
Monocytes Absolute: 0.5 10*3/uL (ref 0.1–0.9)
NEUTROS ABS: 2.7 10*3/uL (ref 1.4–7.0)
Neutrophils: 59 %
Platelets: 343 10*3/uL (ref 150–379)
RBC: 3.78 x10E6/uL (ref 3.77–5.28)
RDW: 16.6 % — AB (ref 12.3–15.4)
RETIC CT PCT: 2 % (ref 0.6–2.6)
Total Iron Binding Capacity: 211 ug/dL — ABNORMAL LOW (ref 250–450)
UIBC: 151 ug/dL (ref 118–369)
WBC: 4.5 10*3/uL (ref 3.4–10.8)

## 2017-11-12 LAB — THYROID PANEL WITH TSH
Free Thyroxine Index: 0.5 — ABNORMAL LOW (ref 1.2–4.9)
T3 Uptake Ratio: 21 % — ABNORMAL LOW (ref 24–39)
T4, Total: 2.2 ug/dL — ABNORMAL LOW (ref 4.5–12.0)
TSH: 4.57 u[IU]/mL — ABNORMAL HIGH (ref 0.450–4.500)

## 2017-11-12 LAB — LIPID PANEL
CHOLESTEROL TOTAL: 173 mg/dL (ref 100–199)
Chol/HDL Ratio: 3.5 ratio (ref 0.0–4.4)
HDL: 49 mg/dL (ref >39)
LDL CALC: 99 mg/dL (ref 0–99)
TRIGLYCERIDES: 125 mg/dL (ref 0–149)
VLDL CHOLESTEROL CAL: 25 mg/dL (ref 5–40)

## 2017-11-12 LAB — CMP14+EGFR
ALBUMIN: 3.4 g/dL — AB (ref 3.6–4.8)
ALT: 17 IU/L (ref 0–32)
AST: 27 IU/L (ref 0–40)
Albumin/Globulin Ratio: 2.3 — ABNORMAL HIGH (ref 1.2–2.2)
Alkaline Phosphatase: 78 IU/L (ref 39–117)
BUN / CREAT RATIO: 20 (ref 12–28)
BUN: 21 mg/dL (ref 8–27)
Bilirubin Total: <0.2 mg/dL (ref 0.0–1.2)
CO2: 14 mmol/L — AB (ref 20–29)
CREATININE: 1.03 mg/dL — AB (ref 0.57–1.00)
Calcium: 7.4 mg/dL — ABNORMAL LOW (ref 8.7–10.3)
Chloride: 117 mmol/L (ref 96–106)
GFR calc Af Amer: 65 mL/min/{1.73_m2} (ref >59)
GFR calc non Af Amer: 56 mL/min/{1.73_m2} — ABNORMAL LOW (ref >59)
GLUCOSE: 78 mg/dL (ref 65–99)
Globulin, Total: 1.5 g/dL (ref 1.5–4.5)
Potassium: 3.5 mmol/L (ref 3.5–5.2)
Sodium: 143 mmol/L (ref 134–144)
Total Protein: 4.9 g/dL — ABNORMAL LOW (ref 6.0–8.5)

## 2017-11-22 ENCOUNTER — Telehealth: Payer: Self-pay | Admitting: Nurse Practitioner

## 2017-11-23 NOTE — Telephone Encounter (Signed)
Does not come in 150 she can break one tablet in 1/2 to make 150

## 2017-11-25 ENCOUNTER — Other Ambulatory Visit: Payer: PPO

## 2017-11-29 ENCOUNTER — Other Ambulatory Visit: Payer: Self-pay | Admitting: Gastroenterology

## 2017-11-30 ENCOUNTER — Encounter (HOSPITAL_COMMUNITY): Payer: Self-pay

## 2017-12-01 ENCOUNTER — Other Ambulatory Visit: Payer: PPO

## 2017-12-01 ENCOUNTER — Encounter (HOSPITAL_COMMUNITY): Payer: Self-pay

## 2017-12-01 ENCOUNTER — Other Ambulatory Visit: Payer: Self-pay

## 2017-12-01 NOTE — Progress Notes (Signed)
Left voice message with new arrival time and location [facility]) check in at Admitting at 9:00 A. M. on Thursday, Dec 02, 2017. Additional instructions were left according to pre-op checklist. Left instructions for pt to stop taking vitamins, fish oil and herbal supplements such as Flaxseed and Probiotics. Also stop taking Chlorphen-PE-Acetaminophen.

## 2017-12-02 ENCOUNTER — Encounter (HOSPITAL_COMMUNITY)
Admission: RE | Disposition: A | Discharge status: Home / Self Care | Payer: Self-pay | Source: Ambulatory Visit | Attending: Gastroenterology

## 2017-12-02 ENCOUNTER — Encounter (HOSPITAL_COMMUNITY): Payer: Self-pay | Admitting: Certified Registered Nurse Anesthetist

## 2017-12-02 ENCOUNTER — Ambulatory Visit (HOSPITAL_COMMUNITY)
Admission: RE | Admit: 2017-12-02 | Discharge: 2017-12-02 | Disposition: A | Discharge status: Home / Self Care | Payer: PPO | Source: Ambulatory Visit | Attending: Gastroenterology | Admitting: Gastroenterology

## 2017-12-02 ENCOUNTER — Ambulatory Visit (HOSPITAL_COMMUNITY): Payer: PPO | Admitting: Certified Registered Nurse Anesthetist

## 2017-12-02 DIAGNOSIS — K219 Gastro-esophageal reflux disease without esophagitis: Secondary | ICD-10-CM | POA: Insufficient documentation

## 2017-12-02 DIAGNOSIS — Z79890 Hormone replacement therapy (postmenopausal): Secondary | ICD-10-CM | POA: Diagnosis not present

## 2017-12-02 DIAGNOSIS — F419 Anxiety disorder, unspecified: Secondary | ICD-10-CM | POA: Diagnosis not present

## 2017-12-02 DIAGNOSIS — T18128A Food in esophagus causing other injury, initial encounter: Secondary | ICD-10-CM | POA: Diagnosis not present

## 2017-12-02 DIAGNOSIS — R112 Nausea with vomiting, unspecified: Secondary | ICD-10-CM | POA: Insufficient documentation

## 2017-12-02 DIAGNOSIS — Z79899 Other long term (current) drug therapy: Secondary | ICD-10-CM | POA: Insufficient documentation

## 2017-12-02 DIAGNOSIS — R1310 Dysphagia, unspecified: Secondary | ICD-10-CM

## 2017-12-02 DIAGNOSIS — D509 Iron deficiency anemia, unspecified: Secondary | ICD-10-CM | POA: Diagnosis not present

## 2017-12-02 DIAGNOSIS — E039 Hypothyroidism, unspecified: Secondary | ICD-10-CM | POA: Diagnosis not present

## 2017-12-02 DIAGNOSIS — K311 Adult hypertrophic pyloric stenosis: Secondary | ICD-10-CM | POA: Insufficient documentation

## 2017-12-02 HISTORY — DX: Pneumonia, unspecified organism: J18.9

## 2017-12-02 HISTORY — PX: ESOPHAGOGASTRODUODENOSCOPY (EGD) WITH PROPOFOL: SHX5813

## 2017-12-02 HISTORY — PX: BALLOON DILATION: SHX5330

## 2017-12-02 SURGERY — ESOPHAGOGASTRODUODENOSCOPY (EGD) WITH PROPOFOL
Anesthesia: Monitor Anesthesia Care

## 2017-12-02 MED ORDER — PROPOFOL 10 MG/ML IV BOLUS
INTRAVENOUS | Status: DC | PRN
  Administered 2017-12-02 (×3): 15 mg via INTRAVENOUS

## 2017-12-02 MED ORDER — ONDANSETRON HCL 4 MG/2ML IJ SOLN
4.0000 mg | Freq: Once | INTRAMUSCULAR | Status: AC
  Administered 2017-12-02: 4 mg via INTRAVENOUS

## 2017-12-02 MED ORDER — LACTATED RINGERS IV SOLN
INTRAVENOUS | Status: DC | PRN
  Administered 2017-12-02: 10:00:00 via INTRAVENOUS

## 2017-12-02 MED ORDER — LIDOCAINE HCL (CARDIAC) PF 100 MG/5ML IV SOSY
PREFILLED_SYRINGE | INTRAVENOUS | Status: DC | PRN
  Administered 2017-12-02: 50 mg via INTRATRACHEAL

## 2017-12-02 MED ORDER — SODIUM CHLORIDE 0.9 % IV SOLN: INTRAVENOUS | Status: DC

## 2017-12-02 MED ORDER — BUTAMBEN-TETRACAINE-BENZOCAINE 2-2-14 % EX AERO
INHALATION_SPRAY | CUTANEOUS | Status: DC | PRN
  Administered 2017-12-02: 2 via TOPICAL

## 2017-12-02 MED ORDER — PROPOFOL 500 MG/50ML IV EMUL
INTRAVENOUS | Status: DC | PRN
  Administered 2017-12-02: 125 ug/kg/min via INTRAVENOUS

## 2017-12-02 SURGICAL SUPPLY — 15 items

## 2017-12-02 NOTE — Anesthesia Preprocedure Evaluation (Addendum)
Anesthesia Evaluation  Patient identified by MRN, date of birth, ID band Patient awake    Reviewed: Allergy & Precautions, NPO status , Patient's Chart, lab work & pertinent test results  History of Anesthesia Complications Negative for: history of anesthetic complications  Airway Mallampati: I  TM Distance: >3 FB Neck ROM: Full    Dental  (+) Caps, Dental Advisory Given   Pulmonary neg pulmonary ROS,    breath sounds clear to auscultation       Cardiovascular (-) anginanegative cardio ROS   Rhythm:Regular Rate:Normal     Neuro/Psych Anxiety negative neurological ROS     GI/Hepatic Neg liver ROS, GERD  Medicated and Controlled,Recurrent GI bleed   Endo/Other  Hypothyroidism   Renal/GU negative Renal ROS     Musculoskeletal   Abdominal   Peds  Hematology   Anesthesia Other Findings   Reproductive/Obstetrics                            Anesthesia Physical Anesthesia Plan  ASA: II  Anesthesia Plan: MAC   Post-op Pain Management:    Induction:   PONV Risk Score and Plan: 2 and Treatment may vary due to age or medical condition and Ondansetron  Airway Management Planned: Natural Airway and Nasal Cannula  Additional Equipment:   Intra-op Plan:   Post-operative Plan:   Informed Consent: I have reviewed the patients History and Physical, chart, labs and discussed the procedure including the risks, benefits and alternatives for the proposed anesthesia with the patient or authorized representative who has indicated his/her understanding and acceptance.   Dental advisory given  Plan Discussed with: CRNA and Surgeon  Anesthesia Plan Comments: (Plan routine monitors, MAC)        Anesthesia Quick Evaluation

## 2017-12-02 NOTE — Anesthesia Procedure Notes (Signed)
Procedure Name: MAC Date/Time: 12/02/2017 10:22 AM Performed by: White, Amedeo Plenty, CRNA Pre-anesthesia Checklist: Patient identified, Emergency Drugs available, Suction available and Patient being monitored Patient Re-evaluated:Patient Re-evaluated prior to induction Oxygen Delivery Method: Nasal cannula

## 2017-12-02 NOTE — Op Note (Signed)
[facility] Patient Name: Brittany Hensley Procedure Date : 12/02/2017 MRN: [id number] Attending MD: Clarene Essex , MD Date of Birth: 11/29/1950 CSN: [id number] Age: 67 Admit Type: Outpatient Procedure:                Upper GI endoscopy Indications:              Nausea with vomiting,history of anastomotic                            stricture Providers:                Clarene Essex, MD, Kingsley Plan, RN, William Dalton, Technician Referring MD:              Medicines:                Propofol total dose 120 mg IV, Cetacaine spray,                            Ondansetron 4 mg IV,50 mg IV lidocaine Complications:            No immediate complications. Estimated blood loss:                            Minimal from dilation with with no active bleeding                            at the end of the procedure Estimated Blood Loss:     Estimated blood loss: none. Estimated blood loss                            was minimal. Procedure:                Pre-Anesthesia Assessment:                           - Prior to the procedure, a History and Physical                            was performed, and patient medications and                            allergies were reviewed. The patient's tolerance of                            previous anesthesia was also reviewed. The risks                            and benefits of the procedure and the sedation                            options and risks were discussed with the patient.  All questions were answered, and informed consent                            was obtained. Prior Anticoagulants: The patient has                            taken no previous anticoagulant or antiplatelet                            agents. ASA Grade Assessment: II - A patient with                            mild systemic disease. After reviewing the risks                            and benefits, the patient was deemed in                             satisfactory condition to undergo the procedure.                           After obtaining informed consent, the endoscope was                            passed under direct vision. Throughout the                            procedure, the patient's blood pressure, pulse, and                            oxygen saturations were monitored continuously. The                            EG-[id number] ([id number]) scope was introduced through the                            mouth, and advanced to the jejunum. The upper GI                            endoscopy was accomplished without difficulty. The                            patient tolerated the procedure well. Scope In: Scope Out: Findings:      Food was found in the entire esophagus.      Evidence of a Roux-en-Y gastrojejunostomy was found. The gastrojejunal       anastomosis was characterized by congestion, erythema, friable mucosa, a       hemorrhagic appearance, inflammation and moderate stenosis. This was       traversed after dilation. The pouch-to-jejunum limb was characterized by       healthy appearing mucosa.      A medium amount of food (residue) was found on the greater curvature of       the stomach.      Evidence of a gastrojejunostomy was  found in the gastric body. This was       characterized by moderate stenosis. A TTS dilator was passed through the       scope. Dilation with a 05-14-11 mm wire guided balloon was used and then       we were easily able to pass the scope, and then we proceeded with       12-13.5-15 mm esophageal balloon and a 15-16.5-18 mm esophageal balloon       dilator was performed to 18 mm. The dilation site was examined and       showed significant improvement in luminal narrowing.      The examined jejunum was normal.      The exam was otherwise without abnormality. Impression:               - Food in the esophagus.                           - Roux-en-Y gastrojejunostomy with  gastrojejunal                            anastomosis characterized by erythema, friable                            mucosa, a hemorrhagic appearance, inflammation,                            moderate stenosis and congestion.                           - A medium amount of food (residue) in the stomach.                           - A gastrojejunostomy was found, characterized by                            moderate stenosis. Dilated.                           - Normal examined jejunum.                           - The examination was otherwise normal.                           - No specimens collected. Moderate Sedation:      moderate sedation-none Recommendation:           - Patient has a contact number available for                            emergencies. The signs and symptoms of potential                            delayed complications were discussed with the                            patient. Return to normal activities tomorrow.  Written discharge instructions were provided to the                            patient.                           - Clear liquid diet for 4 hours. then slowly                            advance as tolerated                           - Continue present medications.                           - Return to GI clinic in 3 months.                           - Telephone GI clinic if symptomatic PRN. Procedure Code(s):        --- Professional ---                           [phone], Esophagogastroduodenoscopy, flexible,                            transoral; with dilation of gastric/duodenal                            stricture(s) (eg, balloon, bougie) Diagnosis Code(s):        --- Professional ---                           X93.716R, Food in esophagus causing other injury,                            initial encounter                           Z98.0, Intestinal bypass and anastomosis status                           R11.2, Nausea with vomiting,  unspecified CPT copyright 2017 American Medical Association. All rights reserved. The codes documented in this report are preliminary and upon coder review may  be revised to meet current compliance requirements. Clarene Essex, MD 12/02/2017 10:59:41 AM This report has been signed electronically. Number of Addenda: 0

## 2017-12-02 NOTE — Discharge Instructions (Signed)
YOU HAD AN ENDOSCOPIC PROCEDURE TODAY: Refer to the procedure report and other information in the discharge instructions given to you for any specific questions about what was found during the examination. If this information does not answer your questions, please call [facility] office at [phone] to clarify.   YOU SHOULD EXPECT: Some feelings of bloating in the abdomen. Passage of more gas than usual. Walking can help get rid of the air that was put into your GI tract during the procedure and reduce the bloating. If you had a lower endoscopy (such as a colonoscopy or flexible sigmoidoscopy) you may notice spotting of blood in your stool or on the toilet paper. Some abdominal soreness may be present for a day or two, also.  DIET: Your first meal following the procedure should be a light meal and then it is ok to progress to your normal diet. A half-sandwich or bowl of soup is an example of a good first meal. Heavy or fried foods are harder to digest and may make you feel nauseous or bloated. Drink plenty of fluids but you should avoid alcoholic beverages for 24 hours. If you had a esophageal dilation, please see attached instructions for diet.   ACTIVITY: Your care partner should take you home directly after the procedure. You should plan to take it easy, moving slowly for the rest of the day. You can resume normal activity the day after the procedure however YOU SHOULD NOT DRIVE, use power tools, machinery or perform tasks that involve climbing or major physical exertion for 24 hours (because of the sedation medicines used during the test).   SYMPTOMS TO REPORT IMMEDIATELY: A gastroenterologist can be reached at any hour. Please call [phone]  for any of the following symptoms:   Following upper endoscopy (EGD, EUS, ERCP, esophageal dilation) Vomiting of blood or coffee ground material  New, significant abdominal pain  New, significant chest pain or pain under the shoulder blades  Painful or  persistently difficult swallowing  New shortness of breath  Black, tarry-looking or red, bloody stools  FOLLOW UP:  If any biopsies were taken you will be contacted by phone or by letter within the next 1-3 weeks. Call [phone]  if you have not heard about the biopsies in 3 weeks.  Please also call with any specific questions about appointments or follow up tests. Call if question or problem otherwise follow-up in 3 months and slowly advance diet

## 2017-12-02 NOTE — Anesthesia Postprocedure Evaluation (Signed)
Anesthesia Post Note  Patient: Brittany Hensley  Procedure(s) Performed: ESOPHAGOGASTRODUODENOSCOPY (EGD) WITH PROPOFOL (N/A ) BALLOON DILATION (N/A )     Patient location during evaluation: Endoscopy Anesthesia Type: MAC Level of consciousness: awake and alert, oriented and patient cooperative Pain management: pain level controlled Vital Signs Assessment: vitals unstable and post-procedure vital signs reviewed and stable Respiratory status: spontaneous breathing, nonlabored ventilation and respiratory function stable Cardiovascular status: blood pressure returned to baseline and stable Postop Assessment: no apparent nausea or vomiting Anesthetic complications: no    Last Vitals:  Vitals:   12/02/17 1050 12/02/17 1100  BP: 109/65 111/67  Pulse: 71 71  Resp: 12 11  Temp:    SpO2: 100% 100%    Last Pain:  Vitals:   12/02/17 1100  TempSrc:   PainSc: 0-No pain                 Jeremi Losito,E. Mykah Bellomo

## 2017-12-02 NOTE — Progress Notes (Addendum)
Brittany Hensley 10:18 AM  Subjective: Patient with some lung issues since I last saw her and has lost some weight and her dilation lasted until about a month ago and she has no new complaints  Objective: Vital signs stable afebrile no acute distress exam please see preassessment evaluation labs reviewed CT reviewed  Assessment: Gastric outlet obstruction secondary to anastomotic stricture  Plan: Okay to proceed with EGD and probable balloon dilation with anesthesia assistance  [facility] E  Pager [phone] After 5PM or if no answer call [phone]

## 2017-12-02 NOTE — Transfer of Care (Signed)
Immediate Anesthesia Transfer of Care Note  Patient: Brittany Hensley  Procedure(s) Performed: ESOPHAGOGASTRODUODENOSCOPY (EGD) WITH PROPOFOL (N/A ) BALLOON DILATION (N/A )  Patient Location: Endoscopy Unit  Anesthesia Type:MAC  Level of Consciousness: awake, alert  and patient cooperative  Airway & Oxygen Therapy: Patient Spontanous Breathing  Post-op Assessment: Report given to RN and Post -op Vital signs reviewed and stable  Post vital signs: Reviewed and stable  Last Vitals:  Vitals Value Taken Time  BP 99/49 12/02/2017 10:46 AM  Temp    Pulse 73 12/02/2017 10:46 AM  Resp 15 12/02/2017 10:46 AM  SpO2 100 % 12/02/2017 10:46 AM    Last Pain:  Vitals:   12/02/17 1046  TempSrc: Oral  PainSc: 0-No pain         Complications: No apparent anesthesia complications

## 2017-12-03 ENCOUNTER — Encounter (HOSPITAL_COMMUNITY): Payer: Self-pay | Admitting: Gastroenterology

## 2017-12-20 NOTE — Progress Notes (Signed)
Subjective: CC: RLE pain PCP: Chevis Pretty, FNP Brittany Hensley is a 67 y.o. female presenting to clinic today for:  1. Right leg pain Patient reports that she has had a 2-day history of right upper thigh pain.  She notes that she actually had left upper thigh pain in April after moving several heavy objects out of a closet.  She describes the pain as a stiff feeling that is alleviated by compression and keeping the leg straight.  She points to the anterior thigh as the area of pain.  She notes that twisting motion seems to make pain worse.  She has been using Tylenol twice a day with little improvement in symptoms.  She is unable to take oral NSAIDs secondary to gastric surgeries in the past.  She is also currently treated with benzodiazepine for anxiety.  No falls or weakness.  No numbness or tingling.   ROS: Per HPI  Allergies  Allergen Reactions  . Augmentin [Amoxicillin-Pot Clavulanate]   . Famotidine Other (See Comments)    Fever   . Klonopin [Clonazepam]     REACTION: double vision  . Reglan [Metoclopramide]     Causes confusion, anxiety  . Nitrofurantoin Rash  . Sulfamethoxazole-Trimethoprim Rash   Past Medical History:  Diagnosis Date  . Anemia    iron   . Anxiety   . Diverticulitis    Hx: of  . GERD (gastroesophageal reflux disease)    at times  . History of kidney stones    10 years ago  . Hypothyroidism   . Low iron   . Pneumonia   . Rosacea    Hx: of    Current Outpatient Medications:  .  acetaminophen (TYLENOL) 500 MG tablet, Take 1,000 mg by mouth 2 (two) times daily., Disp: , Rfl:  .  ALPRAZolam (XANAX) 0.5 MG tablet, Take 1 tablet (0.5 mg total) by mouth 4 (four) times daily as needed for anxiety., Disp: 120 tablet, Rfl: 2 .  Ascorbic Acid (VITAMIN C) 500 MG CHEW, Chew 500 mg by mouth 2 (two) times daily., Disp: , Rfl:  .  CALCIUM CITRATE PO, Take 600 mg by mouth 2 (two) times daily. , Disp: , Rfl:  .  Chlorphen-PE-Acetaminophen  4-10-325 MG TABS, Take 1 tablet by mouth every 6 (six) hours as needed., Disp: 20 tablet, Rfl: 0 .  cholecalciferol (VITAMIN D) 1000 units tablet, Take 1,000 Units by mouth 2 (two) times daily., Disp: , Rfl:  .  Flaxseed, Linseed, (FLAXSEED OIL) 1200 MG CAPS, Take 1,200 mg by mouth daily with lunch., Disp: , Rfl:  .  levothyroxine (SYNTHROID, LEVOTHROID) 100 MCG tablet, Take 1 tablet (100 mcg total) by mouth every morning., Disp: 90 tablet, Rfl: 1 .  loratadine (CLARITIN) 10 MG tablet, Take 10 mg by mouth 2 (two) times daily.  , Disp: , Rfl:  .  magnesium gluconate (MAGONATE) 500 MG tablet, Take 500 mg by mouth daily with lunch. , Disp: , Rfl:  .  Multiple Vitamins-Minerals (CENTRUM SILVER) CHEW, Chew 1 tablet by mouth 2 (two) times daily., Disp: , Rfl:  .  omeprazole (PRILOSEC) 40 MG capsule, Take 1 capsule (40 mg total) by mouth daily., Disp: 90 capsule, Rfl: 1 .  Probiotic Product (PROBIOTIC PO), Take 1 capsule by mouth daily. 2 BILLION, Disp: , Rfl:  .  sertraline (ZOLOFT) 100 MG tablet, Take 2 tablets (200 mg total) by mouth daily. (Patient taking differently: Take 150 mg by mouth daily. ), Disp: 60 tablet, Rfl: 5 .  sucralfate (CARAFATE) 1 G tablet, Take 1 g by mouth 3 (three) times daily. , Disp: , Rfl:  Social History   Socioeconomic History  . Marital status: Widowed    Spouse name: Not on file  . Number of children: Not on file  . Years of education: Not on file  . Highest education level: Not on file  Occupational History  . Not on file  Social Needs  . Financial resource strain: Not on file  . Food insecurity:    Worry: Not on file    Inability: Not on file  . Transportation needs:    Medical: Not on file    Non-medical: Not on file  Tobacco Use  . Smoking status: Never Smoker  . Smokeless tobacco: Never Used  Substance and Sexual Activity  . Alcohol use: No  . Drug use: No  . Sexual activity: Not Currently  Lifestyle  . Physical activity:    Days per week: Not on  file    Minutes per session: Not on file  . Stress: Not on file  Relationships  . Social connections:    Talks on phone: Not on file    Gets together: Not on file    Attends religious service: Not on file    Active member of club or organization: Not on file    Attends meetings of clubs or organizations: Not on file    Relationship status: Not on file  . Intimate partner violence:    Fear of current or ex partner: Not on file    Emotionally abused: Not on file    Physically abused: Not on file    Forced sexual activity: Not on file  Other Topics Concern  . Not on file  Social History Narrative  . Not on file   Family History  Problem Relation Age of Onset  . Diabetes Mother   . Cancer - Prostate Brother     Objective: Office vital signs reviewed. BP 124/77   Pulse 81   Temp (!) 97.4 F (36.3 C) (Oral)   Ht 5\' 5"  (1.651 m)   Wt 91 lb 12.8 oz (41.6 kg)   BMI 15.28 kg/m   Physical Examination:  General: Awake, alert, malnourished, No acute distress Extremities: warm, well perfused, No edema, cyanosis or clubbing; +2 pulses bilaterally MSK: antlagic gait  Right lower extremity: Patient has full active range of motion.  No tenderness to palpation to the right hip, greater trochanter or right knee. 4/5 strength in all planes. Mild pressure with FABER. Negative FADIR.  Left lower extremity: Patient has full active range of motion.  No tenderness to palpation to the right hip, greater trochanter or right knee. 4/5 strength in all planes. Skin: dry; intact; no rashes or lesions Neuro: Lower extremity light touch sensation grossly intact  Assessment/ Plan: 67 y.o. female   1. Acute pain of right thigh I suspect muscle strain.  Her physical exam was fairly unremarkable except for mild "pressure" with external rotation of the right hip.  Certainly no evidence of greater trochanteric bursitis or IT band syndrome.  Because she is unable to take oral NSAIDs, I have discussed  continuing Tylenol and will also prescribe a small amount of tramadol.  I did advise extreme caution, particularly with the use of benzodiazepine.  I placed a referral to physical therapy.  May continue compression to the thigh and use ice.  She has follow-up with her PCP in 2 weeks. - Ambulatory referral to Physical Therapy  2. Muscle strain - Ambulatory referral to Physical Therapy   Orders Placed This Encounter  Procedures  . Ambulatory referral to Physical Therapy    Referral Priority:   Routine    Referral Type:   Physical Medicine    Referral Reason:   Specialty Services Required    Requested Specialty:   Physical Therapy    Number of Visits Requested:   1   Meds ordered this encounter  Medications  . traMADol (ULTRAM) 50 MG tablet    Sig: Take 1 tablet (50 mg total) by mouth every 12 (twelve) hours as needed.    Dispense:  10 tablet    Refill:  0    The Narcotic Database has been reviewed.  There were no red flags.    Janora Norlander, DO [place] [phone]

## 2017-12-21 ENCOUNTER — Ambulatory Visit (INDEPENDENT_AMBULATORY_CARE_PROVIDER_SITE_OTHER): Payer: PPO | Admitting: Family Medicine

## 2017-12-21 ENCOUNTER — Encounter: Payer: Self-pay | Admitting: Family Medicine

## 2017-12-21 VITALS — BP 124/77 | HR 81 | Temp 97.4°F | Ht 65.0 in | Wt 91.8 lb

## 2017-12-21 DIAGNOSIS — M79651 Pain in right thigh: Secondary | ICD-10-CM | POA: Diagnosis not present

## 2017-12-21 DIAGNOSIS — S76911A Strain of unspecified muscles, fascia and tendons at thigh level, right thigh, initial encounter: Secondary | ICD-10-CM

## 2017-12-21 DIAGNOSIS — T148XXA Other injury of unspecified body region, initial encounter: Secondary | ICD-10-CM

## 2017-12-21 MED ORDER — TRAMADOL HCL 50 MG PO TABS: 50.0000 mg | ORAL_TABLET | Freq: Two times a day (BID) | ORAL | 0 refills | Status: DC | PRN

## 2017-12-21 NOTE — Patient Instructions (Signed)
I have prescribed you a small quantity of tramadol to use if needed for pain.  Remember that this can cause sedation.  Follow-up with your primary care doctor in 2 weeks.  I have also placed a referral to physical therapy.  They will contact you for an appointment.   Muscle Strain A muscle strain (pulled muscle) happens when a muscle is stretched beyond normal length. It happens when a sudden, violent force stretches your muscle too far. Usually, a few of the fibers in your muscle are torn. Muscle strain is common in athletes. Recovery usually takes 1-2 weeks. Complete healing takes 5-6 weeks. Follow these instructions at home:  Follow the PRICE method of treatment to help your injury get better. Do this the first 2-3 days after the injury: ? Protect. Protect the muscle to keep it from getting injured again. ? Rest. Limit your activity and rest the injured body part. ? Ice. Put ice in a plastic bag. Place a towel between your skin and the bag. Then, apply the ice and leave it on from 15-20 minutes each hour. After the third day, switch to moist heat packs. ? Compression. Use a splint or elastic bandage on the injured area for comfort. Do not put it on too tightly. ? Elevate. Keep the injured body part above the level of your heart.  Only take medicine as told by your doctor.  Warm up before doing exercise to prevent future muscle strains. Contact a doctor if:  You have more pain or puffiness (swelling) in the injured area.  You feel numbness, tingling, or notice a loss of strength in the injured area. This information is not intended to replace advice given to you by your health care provider. Make sure you discuss any questions you have with your health care provider. Document Released: 04/28/2008 Document Revised: 12/26/2015 Document Reviewed: 02/16/2013 Elsevier Interactive Patient Education  2017 [employer].

## 2017-12-22 ENCOUNTER — Ambulatory Visit: Payer: PPO | Attending: Family Medicine | Admitting: Physical Therapy

## 2017-12-22 ENCOUNTER — Encounter: Payer: Self-pay | Admitting: Physical Therapy

## 2017-12-22 ENCOUNTER — Telehealth: Payer: Self-pay | Admitting: Oncology

## 2017-12-22 DIAGNOSIS — M79651 Pain in right thigh: Secondary | ICD-10-CM | POA: Diagnosis not present

## 2017-12-22 NOTE — Telephone Encounter (Signed)
Called pt and left msg.  Recheduled appts from 7/25 to 7/26 @ 11 AM. Calender sent.

## 2017-12-22 NOTE — Therapy (Signed)
[facility] [place], [place], [address] Phone: [phone]   Fax:  [phone]  Physical Therapy Evaluation  Patient Details  Name: Brittany Hensley MRN: [id number] Date of Birth: Aug 20, 1950 Referring Provider: Ronnie Doss DO   Encounter Date: 12/22/2017  PT End of Session - 12/22/17 1351    Visit Number  1    Number of Visits  12    Date for PT Re-Evaluation  02/02/18    PT Start Time  0100    PT Stop Time  0149    PT Time Calculation (min)  49 min    Activity Tolerance  Patient tolerated treatment well    Behavior During Therapy  [facility] for tasks assessed/performed       Past Medical History:  Diagnosis Date  . Anemia    iron   . Anxiety   . Diverticulitis    Hx: of  . GERD (gastroesophageal reflux disease)    at times  . History of kidney stones    10 years ago  . Hypothyroidism   . Low iron   . Pneumonia   . Rosacea    Hx: of    Past Surgical History:  Procedure Laterality Date  . APPENDECTOMY    . BALLOON DILATION  06/16/2011   Procedure: BALLOON DILATION;  Surgeon: Jeryl Columbia, MD;  Location: [facility] ENDOSCOPY;  Service: Endoscopy;  Laterality: N/A;  . BALLOON DILATION N/A 10/03/2013   Procedure: BALLOON DILATION;  Surgeon: Jeryl Columbia, MD;  Location: [facility] ENDOSCOPY;  Service: Endoscopy;  Laterality: N/A;  . BALLOON DILATION N/A 08/10/2014   Procedure: BALLOON DILATION;  Surgeon: Jeryl Columbia, MD;  Location: [facility] ENDOSCOPY;  Service: Endoscopy;  Laterality: N/A;  from 12 - 15 cm dilation completed  . BALLOON DILATION N/A 02/15/2015   Procedure: BALLOON DILATION;  Surgeon: Clarene Essex, MD;  Location: [facility] ENDOSCOPY;  Service: Endoscopy;  Laterality: N/A;  . BALLOON DILATION N/A 11/12/2015   Procedure: BALLOON DILATION;  Surgeon: Clarene Essex, MD;  Location: [facility] ENDOSCOPY;  Service: Endoscopy;  Laterality: N/A;  . BALLOON DILATION N/A 08/10/2016   Procedure: BALLOON DILATION;  Surgeon: Clarene Essex, MD;  Location: [facility]  ENDOSCOPY;  Service: Endoscopy;  Laterality: N/A;  . BALLOON DILATION N/A 08/07/2016   Procedure: BALLOON DILATION;  Surgeon: Clarene Essex, MD;  Location: [facility] ENDOSCOPY;  Service: Endoscopy;  Laterality: N/A;  . BALLOON DILATION N/A 05/20/2017   Procedure: BALLOON DILATION;  Surgeon: Clarene Essex, MD;  Location: [facility] ENDOSCOPY;  Service: Endoscopy;  Laterality: N/A;  . BALLOON DILATION N/A 12/02/2017   Procedure: BALLOON DILATION;  Surgeon: Clarene Essex, MD;  Location: [facility];  Service: Endoscopy;  Laterality: N/A;  . CHOLECYSTECTOMY OPEN  1979  . COLONOSCOPY     Hx: of  . ESOPHAGOGASTRODUODENOSCOPY  06/16/2011   Procedure: ESOPHAGOGASTRODUODENOSCOPY (EGD);  Surgeon: Jeryl Columbia, MD;  Location: [facility] ENDOSCOPY;  Service: Endoscopy;  Laterality: N/A;  . ESOPHAGOGASTRODUODENOSCOPY N/A 10/03/2013   Procedure: ESOPHAGOGASTRODUODENOSCOPY (EGD);  Surgeon: Jeryl Columbia, MD;  Location: [facility] ENDOSCOPY;  Service: Endoscopy;  Laterality: N/A;  . ESOPHAGOGASTRODUODENOSCOPY N/A 08/10/2014   Procedure: ESOPHAGOGASTRODUODENOSCOPY (EGD);  Surgeon: Jeryl Columbia, MD;  Location: [facility] ENDOSCOPY;  Service: Endoscopy;  Laterality: N/A;  . ESOPHAGOGASTRODUODENOSCOPY N/A 08/10/2016   Procedure: ESOPHAGOGASTRODUODENOSCOPY (EGD);  Surgeon: Clarene Essex, MD;  Location: [facility] ENDOSCOPY;  Service: Endoscopy;  Laterality: N/A;  . ESOPHAGOGASTRODUODENOSCOPY (EGD) WITH PROPOFOL N/A 02/15/2015   Procedure: ESOPHAGOGASTRODUODENOSCOPY (EGD) WITH PROPOFOL;  Surgeon: Clarene Essex,  MD;  Location: [facility] ENDOSCOPY;  Service: Endoscopy;  Laterality: N/A;  . ESOPHAGOGASTRODUODENOSCOPY (EGD) WITH PROPOFOL N/A 11/12/2015   Procedure: ESOPHAGOGASTRODUODENOSCOPY (EGD) WITH PROPOFOL;  Surgeon: Clarene Essex, MD;  Location: [facility] ENDOSCOPY;  Service: Endoscopy;  Laterality: N/A;  . ESOPHAGOGASTRODUODENOSCOPY (EGD) WITH PROPOFOL N/A 08/07/2016   Procedure: ESOPHAGOGASTRODUODENOSCOPY (EGD) WITH PROPOFOL;  Surgeon: Clarene Essex, MD;  Location: [facility] ENDOSCOPY;  Service: Endoscopy;   Laterality: N/A;  . ESOPHAGOGASTRODUODENOSCOPY (EGD) WITH PROPOFOL N/A 05/20/2017   Procedure: ESOPHAGOGASTRODUODENOSCOPY (EGD) WITH PROPOFOL;  Surgeon: Clarene Essex, MD;  Location: [facility] ENDOSCOPY;  Service: Endoscopy;  Laterality: N/A;  . ESOPHAGOGASTRODUODENOSCOPY (EGD) WITH PROPOFOL N/A 12/02/2017   Procedure: ESOPHAGOGASTRODUODENOSCOPY (EGD) WITH PROPOFOL;  Surgeon: Clarene Essex, MD;  Location: [facility];  Service: Endoscopy;  Laterality: N/A;  have c arm available  . FRACTURE SURGERY     Hx: of left wrist  . GASTRECTOMY  [phone]  . GASTRECTOMY N/A    X3  . LITHOTRIPSY     Hx; of for kidney stones  . SAVORY DILATION N/A 08/10/2014   Procedure: SAVORY DILATION;  Surgeon: Jeryl Columbia, MD;  Location: [facility] ENDOSCOPY;  Service: Endoscopy;  Laterality: N/A;  . TUBAL LIGATION     1975    There were no vitals filed for this visit.   Subjective Assessment - 12/22/17 1324    Subjective  The patient was moving boxes last month which included pushing them with her feet.  She states she felt pain in her thighs but now it is only on the right.  She reports no low back pain or history of.  Her pain-level today is a 6/10 but has moments of severe sharp pain when she moves her right LE a certain way.  Rest and heat decrease her pain and and movement and LE twisting increases her pain.    Limitations  Walking    How long can you walk comfortably?  Short community distances.  Patient was grasping her right thigh as she was walking in the clinc today.    Patient Stated Goals  be bale to finish usual daily tasks, cleaning, sweepin and mopping.    Currently in Pain?  Yes    Pain Score  6     Pain Location  Leg    Pain Orientation  Right    Pain Descriptors / Indicators  Aching    Pain Type  Acute pain    Pain Onset  More than a month ago    Pain Frequency  Constant    Aggravating Factors   See above.    Pain Relieving Factors  See above.         [facility] PT Assessment - 12/22/17 0001       Assessment   Medical Diagnosis  Acute pain of right thigh.    Referring Provider  Ronnie Doss DO    Onset Date/Surgical Date  -- April 2019.      Precautions   Precautions  None      Restrictions   Weight Bearing Restrictions  No      Balance Screen   Has the patient fallen in the past 6 months  No    Has the patient had a decrease in activity level because of a fear of falling?   Yes    Is the patient reluctant to leave their home because of a fear of falling?   No      Home Film/video editor residence  Prior Function   Level of Independence  Independent      ROM / Strength   AROM / PROM / Strength  AROM;Strength      AROM   Overall AROM Comments  Normal right LE AROM      Strength   Overall Strength Comments  Normal right LE strength.      Palpation   Palpation comment  Tender to palpation over the middle portion of the patient's right Rectus Femoris and Vastus Intermedius.      Special Tests   Other special tests  Absent bilateral Patellar reflexes and 1+/4+ bilateral Achilles reflexes.  (-) SLR and FABER testing.      Ambulation/Gait   Gait Comments  Very antalgic gait with patient grasping her right thigh with her hands while walking.                Objective measurements completed on examination: See above findings.      [facility] PT Treatment/Exercise - 12/22/17 0001      Modalities   Modalities  Electrical Stimulation;Moist Heat      Moist Heat Therapy   Number Minutes Moist Heat  15 Minutes    Moist Heat Location  -- Right anterior thigh.      Acupuncturist Location  Right anterior thigh.    Electrical Stimulation Action  Pre-mod.    Electrical Stimulation Parameters  80-150 Hz x 15 minutes.    Electrical Stimulation Goals  Pain                  PT Long Term Goals - 12/22/17 1409      PT LONG TERM GOAL #1   Title  Independent with an HEP.    Time  6     Period  Weeks    Status  New      PT LONG TERM GOAL #2   Title  Walk without antalgia.    Time  6    Period  Weeks    Status  New      PT LONG TERM GOAL #3   Title  Perform ADL's with pain not > 2/10.    Time  6    Period  Weeks    Status  New             Plan - 12/22/17 1352    Clinical Impression Statement  The patient presents to OPPT with c/o pain over her right anterior thigh.  She denies any low back pain.  She has palpably tender over the mid-portion of her right anterior quadriceps. Her pain has resulted in gait antalgia such that the patient grasps her right thigh with her hands while walking. Patient will benefit from skilled physical therapy intervention to address pain and deficits.     Clinical Presentation  Evolving    Clinical Presentation due to:  Not improving.    Clinical Decision Making  Low    Rehab Potential  Good    PT Frequency  2x / week    PT Duration  6 weeks    PT Treatment/Interventions  ADLs/Self Care Home Management;Cryotherapy;Electrical Stimulation;Ultrasound;Moist Heat;Therapeutic activities;Therapeutic exercise;Patient/family education;Vasopneumatic Device    PT Next Visit Plan  Combo e'stim/U/S; STW/M; HMP; E'stim; vasopneumatic.  Pain-free right quad exercises; SLR's (no pain).    Consulted and Agree with Plan of Care  Patient       Patient will benefit from skilled therapeutic intervention in order to improve the  following deficits and impairments:  Abnormal gait, Decreased activity tolerance, Difficulty walking, Pain  Visit Diagnosis: Pain in right thigh - Plan: PT plan of care cert/re-cert     Problem List Patient Active Problem List   Diagnosis Date Noted  . Abnormal x-ray of lung 05/21/2017  . BMI less than 19,adult 01/10/2016  . GAD (generalized anxiety disorder) 01/10/2016  . 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  . Hypothyroidism 04/24/2013  . GERD (gastroesophageal reflux disease) 04/24/2013  . Rosacea 04/24/2013   . Iron deficiency anemia 06/09/2012  . Gastric outlet obstruction 06/16/2011  . Atrioventricular nodal re-entry tachycardia ([place]) 03/11/2010  . MURMUR 03/11/2010  . PERSONAL HISTORY OF URINARY CALCULI 02/14/2008    Kierah Goatley, [place] MPT 12/22/2017, 2:19 PM  [facility] [address] [place], [place], [address] Phone: [phone]   Fax:  [phone]  Name: LALISA KIEHN MRN: [id number] Date of Birth: [birth year]

## 2017-12-29 ENCOUNTER — Other Ambulatory Visit: Payer: PPO

## 2017-12-29 ENCOUNTER — Other Ambulatory Visit: Payer: Self-pay | Admitting: *Deleted

## 2017-12-29 ENCOUNTER — Other Ambulatory Visit: Payer: Self-pay

## 2017-12-29 ENCOUNTER — Inpatient Hospital Stay (HOSPITAL_COMMUNITY)
Admission: EM | Admit: 2017-12-29 | Discharge: 2018-01-03 | DRG: 470 | Disposition: A | Discharge status: Skilled Nursing Facility | Payer: PPO | Attending: Orthopedic Surgery | Admitting: Orthopedic Surgery

## 2017-12-29 ENCOUNTER — Ambulatory Visit (INDEPENDENT_AMBULATORY_CARE_PROVIDER_SITE_OTHER): Payer: PPO

## 2017-12-29 ENCOUNTER — Ambulatory Visit: Payer: PPO | Admitting: Physical Therapy

## 2017-12-29 ENCOUNTER — Encounter (HOSPITAL_COMMUNITY): Payer: Self-pay | Admitting: *Deleted

## 2017-12-29 ENCOUNTER — Emergency Department (HOSPITAL_COMMUNITY): Payer: PPO

## 2017-12-29 ENCOUNTER — Encounter: Payer: Self-pay | Admitting: Family Medicine

## 2017-12-29 ENCOUNTER — Inpatient Hospital Stay (HOSPITAL_COMMUNITY): Payer: PPO

## 2017-12-29 ENCOUNTER — Encounter: Payer: Self-pay | Admitting: Physical Therapy

## 2017-12-29 DIAGNOSIS — S72001A Fracture of unspecified part of neck of right femur, initial encounter for closed fracture: Secondary | ICD-10-CM | POA: Diagnosis not present

## 2017-12-29 DIAGNOSIS — L8990 Pressure ulcer of unspecified site, unspecified stage: Secondary | ICD-10-CM

## 2017-12-29 DIAGNOSIS — M6281 Muscle weakness (generalized): Secondary | ICD-10-CM | POA: Diagnosis not present

## 2017-12-29 DIAGNOSIS — M79604 Pain in right leg: Secondary | ICD-10-CM | POA: Diagnosis not present

## 2017-12-29 DIAGNOSIS — M25551 Pain in right hip: Secondary | ICD-10-CM

## 2017-12-29 DIAGNOSIS — E034 Atrophy of thyroid (acquired): Secondary | ICD-10-CM | POA: Diagnosis not present

## 2017-12-29 DIAGNOSIS — S72051A Unspecified fracture of head of right femur, initial encounter for closed fracture: Secondary | ICD-10-CM | POA: Diagnosis not present

## 2017-12-29 DIAGNOSIS — E039 Hypothyroidism, unspecified: Secondary | ICD-10-CM | POA: Diagnosis present

## 2017-12-29 DIAGNOSIS — Z96641 Presence of right artificial hip joint: Secondary | ICD-10-CM | POA: Diagnosis not present

## 2017-12-29 DIAGNOSIS — Y9389 Activity, other specified: Secondary | ICD-10-CM

## 2017-12-29 DIAGNOSIS — S7291XA Unspecified fracture of right femur, initial encounter for closed fracture: Secondary | ICD-10-CM | POA: Diagnosis present

## 2017-12-29 DIAGNOSIS — L89152 Pressure ulcer of sacral region, stage 2: Secondary | ICD-10-CM | POA: Diagnosis not present

## 2017-12-29 DIAGNOSIS — X58XXXA Exposure to other specified factors, initial encounter: Secondary | ICD-10-CM | POA: Diagnosis present

## 2017-12-29 DIAGNOSIS — I951 Orthostatic hypotension: Secondary | ICD-10-CM | POA: Diagnosis not present

## 2017-12-29 DIAGNOSIS — S7291XS Unspecified fracture of right femur, sequela: Secondary | ICD-10-CM | POA: Diagnosis not present

## 2017-12-29 DIAGNOSIS — R52 Pain, unspecified: Secondary | ICD-10-CM | POA: Diagnosis not present

## 2017-12-29 DIAGNOSIS — Z4889 Encounter for other specified surgical aftercare: Secondary | ICD-10-CM | POA: Diagnosis not present

## 2017-12-29 DIAGNOSIS — Z79890 Hormone replacement therapy (postmenopausal): Secondary | ICD-10-CM | POA: Diagnosis not present

## 2017-12-29 DIAGNOSIS — F411 Generalized anxiety disorder: Secondary | ICD-10-CM | POA: Diagnosis not present

## 2017-12-29 DIAGNOSIS — D62 Acute posthemorrhagic anemia: Secondary | ICD-10-CM | POA: Diagnosis not present

## 2017-12-29 DIAGNOSIS — M8000XD Age-related osteoporosis with current pathological fracture, unspecified site, subsequent encounter for fracture with routine healing: Secondary | ICD-10-CM | POA: Diagnosis not present

## 2017-12-29 DIAGNOSIS — K219 Gastro-esophageal reflux disease without esophagitis: Secondary | ICD-10-CM | POA: Diagnosis present

## 2017-12-29 DIAGNOSIS — Z01818 Encounter for other preprocedural examination: Secondary | ICD-10-CM | POA: Diagnosis not present

## 2017-12-29 DIAGNOSIS — R636 Underweight: Secondary | ICD-10-CM | POA: Diagnosis not present

## 2017-12-29 DIAGNOSIS — R2689 Other abnormalities of gait and mobility: Secondary | ICD-10-CM | POA: Diagnosis not present

## 2017-12-29 DIAGNOSIS — Y92009 Unspecified place in unspecified non-institutional (private) residence as the place of occurrence of the external cause: Secondary | ICD-10-CM | POA: Diagnosis not present

## 2017-12-29 DIAGNOSIS — M79651 Pain in right thigh: Secondary | ICD-10-CM | POA: Diagnosis not present

## 2017-12-29 DIAGNOSIS — Z471 Aftercare following joint replacement surgery: Secondary | ICD-10-CM | POA: Diagnosis not present

## 2017-12-29 DIAGNOSIS — S72011A Unspecified intracapsular fracture of right femur, initial encounter for closed fracture: Secondary | ICD-10-CM | POA: Diagnosis not present

## 2017-12-29 DIAGNOSIS — S72009A Fracture of unspecified part of neck of unspecified femur, initial encounter for closed fracture: Secondary | ICD-10-CM

## 2017-12-29 DIAGNOSIS — F419 Anxiety disorder, unspecified: Secondary | ICD-10-CM | POA: Diagnosis not present

## 2017-12-29 DIAGNOSIS — D500 Iron deficiency anemia secondary to blood loss (chronic): Secondary | ICD-10-CM | POA: Diagnosis not present

## 2017-12-29 DIAGNOSIS — S72041D Displaced fracture of base of neck of right femur, subsequent encounter for closed fracture with routine healing: Secondary | ICD-10-CM | POA: Diagnosis not present

## 2017-12-29 DIAGNOSIS — S72041A Displaced fracture of base of neck of right femur, initial encounter for closed fracture: Secondary | ICD-10-CM | POA: Diagnosis not present

## 2017-12-29 LAB — CBC WITH DIFFERENTIAL/PLATELET
Basophils Absolute: 0 10*3/uL (ref 0.0–0.1)
Basophils Relative: 1 %
Eosinophils Absolute: 0.1 10*3/uL (ref 0.0–0.7)
Eosinophils Relative: 1 %
HCT: 38.7 % (ref 36.0–46.0)
Hemoglobin: 11.8 g/dL — ABNORMAL LOW (ref 12.0–15.0)
Lymphocytes Relative: 19 %
Lymphs Abs: 0.9 10*3/uL (ref 0.7–4.0)
MCH: 32.1 pg (ref 26.0–34.0)
MCHC: 30.5 g/dL (ref 30.0–36.0)
MCV: 105.2 fL — ABNORMAL HIGH (ref 78.0–100.0)
Monocytes Absolute: 0.3 10*3/uL (ref 0.1–1.0)
Monocytes Relative: 6 %
Neutro Abs: 3.5 10*3/uL (ref 1.7–7.7)
Neutrophils Relative %: 73 %
Platelets: 379 10*3/uL (ref 150–400)
RBC: 3.68 MIL/uL — ABNORMAL LOW (ref 3.87–5.11)
RDW: 15.7 % — ABNORMAL HIGH (ref 11.5–15.5)
WBC: 4.8 10*3/uL (ref 4.0–10.5)

## 2017-12-29 LAB — BASIC METABOLIC PANEL
Anion gap: 10 (ref 5–15)
BUN: 20 mg/dL (ref 6–20)
CO2: 15 mmol/L — ABNORMAL LOW (ref 22–32)
Calcium: 7.6 mg/dL — ABNORMAL LOW (ref 8.9–10.3)
Chloride: 118 mmol/L — ABNORMAL HIGH (ref 101–111)
Creatinine, Ser: 1.16 mg/dL — ABNORMAL HIGH (ref 0.44–1.00)
GFR calc Af Amer: 55 mL/min — ABNORMAL LOW (ref >60)
GFR calc non Af Amer: 48 mL/min — ABNORMAL LOW (ref >60)
Glucose, Bld: 89 mg/dL (ref 65–99)
Potassium: 3.8 mmol/L (ref 3.5–5.1)
Sodium: 143 mmol/L (ref 135–145)

## 2017-12-29 MED ORDER — SUCRALFATE 1 G PO TABS
1.0000 g | ORAL_TABLET | Freq: Three times a day (TID) | ORAL | Status: DC
  Administered 2017-12-29 – 2018-01-03 (×13): 1 g via ORAL
  Filled 2017-12-29 (×13): qty 1

## 2017-12-29 MED ORDER — SERTRALINE HCL 50 MG PO TABS
100.0000 mg | ORAL_TABLET | Freq: Every day | ORAL | Status: DC
  Administered 2017-12-29 – 2018-01-02 (×6): 100 mg via ORAL
  Filled 2017-12-29 (×6): qty 2

## 2017-12-29 MED ORDER — ENSURE ENLIVE PO LIQD
237.0000 mL | Freq: Two times a day (BID) | ORAL | Status: DC
  Administered 2017-12-31 (×2): 237 mL via ORAL

## 2017-12-29 MED ORDER — LEVOTHYROXINE SODIUM 100 MCG PO TABS
100.0000 ug | ORAL_TABLET | Freq: Every day | ORAL | Status: DC
  Administered 2017-12-30 – 2018-01-03 (×5): 100 ug via ORAL
  Filled 2017-12-29 (×5): qty 1

## 2017-12-29 MED ORDER — HYDROCODONE-ACETAMINOPHEN 5-325 MG PO TABS
1.0000 | ORAL_TABLET | Freq: Four times a day (QID) | ORAL | Status: DC | PRN
  Filled 2017-12-29: qty 2

## 2017-12-29 MED ORDER — PANTOPRAZOLE SODIUM 40 MG PO TBEC
40.0000 mg | DELAYED_RELEASE_TABLET | Freq: Every day | ORAL | Status: DC
  Administered 2017-12-30 – 2018-01-03 (×5): 40 mg via ORAL
  Filled 2017-12-29 (×5): qty 1

## 2017-12-29 MED ORDER — HEPARIN SODIUM (PORCINE) 5000 UNIT/ML IJ SOLN
5000.0000 [IU] | Freq: Once | INTRAMUSCULAR | Status: AC
  Administered 2017-12-29: 5000 [IU] via SUBCUTANEOUS
  Filled 2017-12-29: qty 1

## 2017-12-29 MED ORDER — ACETAMINOPHEN 500 MG PO TABS
1000.0000 mg | ORAL_TABLET | Freq: Once | ORAL | Status: AC
  Administered 2017-12-29: 1000 mg via ORAL
  Filled 2017-12-29: qty 2

## 2017-12-29 MED ORDER — SODIUM CHLORIDE 0.9 % IV SOLN: Freq: Once | INTRAVENOUS | Status: DC

## 2017-12-29 MED ORDER — PNEUMOCOCCAL VAC POLYVALENT 25 MCG/0.5ML IJ INJ
0.5000 mL | INJECTION | INTRAMUSCULAR | Status: DC
  Filled 2017-12-29: qty 0.5

## 2017-12-29 MED ORDER — ALPRAZOLAM 0.5 MG PO TABS
0.5000 mg | ORAL_TABLET | Freq: Four times a day (QID) | ORAL | Status: DC | PRN
  Administered 2017-12-29 – 2018-01-03 (×8): 0.5 mg via ORAL
  Filled 2017-12-29 (×8): qty 1

## 2017-12-29 MED ORDER — METHOCARBAMOL 500 MG PO TABS: 500.0000 mg | ORAL_TABLET | Freq: Four times a day (QID) | ORAL | Status: DC | PRN

## 2017-12-29 MED ORDER — METHOCARBAMOL 1000 MG/10ML IJ SOLN: 500.0000 mg | Freq: Four times a day (QID) | INTRAVENOUS | Status: DC | PRN

## 2017-12-29 MED ORDER — MUPIROCIN 2 % EX OINT
1.0000 "application " | TOPICAL_OINTMENT | Freq: Two times a day (BID) | CUTANEOUS | Status: AC
  Administered 2017-12-30 – 2017-12-31 (×4): 1 via NASAL
  Filled 2017-12-29 (×2): qty 22

## 2017-12-29 MED ORDER — ENOXAPARIN SODIUM 40 MG/0.4ML ~~LOC~~ SOLN: 40.0000 mg | Freq: Once | SUBCUTANEOUS | Status: DC

## 2017-12-29 MED ORDER — PNEUMOCOCCAL VAC POLYVALENT 25 MCG/0.5ML IJ INJ: 0.5000 mL | INJECTION | INTRAMUSCULAR | Status: DC

## 2017-12-29 MED ORDER — CHLORHEXIDINE GLUCONATE 4 % EX LIQD
60.0000 mL | Freq: Once | CUTANEOUS | Status: AC
  Administered 2017-12-30: 4 via TOPICAL
  Filled 2017-12-29: qty 60

## 2017-12-29 MED ORDER — GABAPENTIN 300 MG PO CAPS
300.0000 mg | ORAL_CAPSULE | Freq: Once | ORAL | Status: AC
  Administered 2017-12-29: 300 mg via ORAL
  Filled 2017-12-29: qty 1

## 2017-12-29 MED ORDER — MORPHINE SULFATE (PF) 2 MG/ML IV SOLN: 0.5000 mg | INTRAVENOUS | Status: DC | PRN

## 2017-12-29 NOTE — Progress Notes (Signed)
Patient with an acute worsening of right thigh pain.  She was actually seen on 12/21/2017.  Her exam was suspicious for muscle strain at that time, as she had no acute hip symptoms.  She was referred to physical therapy and has had 2 sessions.  Upon speaking to patient today.  She notes that she had significant increase in pain after the PT sessions.  She reports that pain has gradually gotten worse since that time.  Pain is a 10/10 today.  This is unrelieved by oral medications.  She had an x-ray of the right femur which did demonstrate an acute subcapital fracture of the right hip.  She drove herself today.  I did recommend that she be transported by EMS to the emergency department as she will most certainly need repair.  Patient was good understanding and is agreeable to transport.  Dg Femur, Min 2 Views Right  Result Date: 12/29/2017 CLINICAL DATA:  Acute right hip pain EXAM: RIGHT FEMUR 2 VIEWS COMPARISON:  None in PACs FINDINGS: There is an acute subcapital fracture of the right hip with milder rotation of the femoral head with respect to the neck. The observed portions of the right hemipelvis are normal. The remainder of the femur is normal where visualized. IMPRESSION: There is an acute subcapital fracture of the right femur. These results will be called to the ordering clinician or representative by the Radiologist Assistant, and communication documented in the PACS or zVision Dashboard. Electronically Signed   By: David  Martinique M.D.   On: 12/29/2017 13:56   A/P: Acute subcapital fracture of the right hip Risk factors for fracture include malnourishment, age and postmenopausal state.  Question possible osteoporotic fracture, as she had no preceding injury/fall.  Would most certainly warrant DEXA scan after recovery from acute fracture if she has not had bone density performed.  Transport to hospital via EMS.  Ashly M. Lajuana Ripple, [facility]

## 2017-12-29 NOTE — ED Provider Notes (Signed)
[facility] EMERGENCY DEPARTMENT Provider Note   CSN: [id number] Arrival date & time: 12/29/17  1523     History   Chief Complaint Chief Complaint  Patient presents with  . Hip Pain    HPI Brittany Hensley is a 67 y.o. female.  HPI   67 year old female sent to the emergency room after having outpatient x-ray significant for right hip fracture.  She reports a several week history of right hip pain.  Initially she was moving some heavy boxes and felt like she twisted or pulled something.  She has had persistent and progressive pain since then.  She has been ambulating on it although she has markedly increased pain with this at this point.  No numbness or tingling.  Past Medical History:  Diagnosis Date  . Anemia    iron   . Anxiety   . Diverticulitis    Hx: of  . GERD (gastroesophageal reflux disease)    at times  . History of kidney stones    10 years ago  . Hypothyroidism   . Low iron   . Pneumonia   . Rosacea    Hx: of    Patient Active Problem List   Diagnosis Date Noted  . Abnormal x-ray of lung 05/21/2017  . BMI less than 19,adult 01/10/2016  . GAD (generalized anxiety disorder) 01/10/2016  . 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  . Hypothyroidism 04/24/2013  . GERD (gastroesophageal reflux disease) 04/24/2013  . Rosacea 04/24/2013  . Iron deficiency anemia 06/09/2012  . Gastric outlet obstruction 06/16/2011  . Atrioventricular nodal re-entry tachycardia ([place]) 03/11/2010  . MURMUR 03/11/2010  . PERSONAL HISTORY OF URINARY CALCULI 02/14/2008    Past Surgical History:  Procedure Laterality Date  . APPENDECTOMY    . BALLOON DILATION  06/16/2011   Procedure: BALLOON DILATION;  Surgeon: Jeryl Columbia, MD;  Location: [facility] ENDOSCOPY;  Service: Endoscopy;  Laterality: N/A;  . BALLOON DILATION N/A 10/03/2013   Procedure: BALLOON DILATION;  Surgeon: Jeryl Columbia, MD;  Location: [facility] ENDOSCOPY;  Service: Endoscopy;  Laterality: N/A;  . BALLOON DILATION N/A 08/10/2014    Procedure: BALLOON DILATION;  Surgeon: Jeryl Columbia, MD;  Location: [facility] ENDOSCOPY;  Service: Endoscopy;  Laterality: N/A;  from 12 - 15 cm dilation completed  . BALLOON DILATION N/A 02/15/2015   Procedure: BALLOON DILATION;  Surgeon: Clarene Essex, MD;  Location: [facility] ENDOSCOPY;  Service: Endoscopy;  Laterality: N/A;  . BALLOON DILATION N/A 11/12/2015   Procedure: BALLOON DILATION;  Surgeon: Clarene Essex, MD;  Location: [facility] ENDOSCOPY;  Service: Endoscopy;  Laterality: N/A;  . BALLOON DILATION N/A 08/10/2016   Procedure: BALLOON DILATION;  Surgeon: Clarene Essex, MD;  Location: [facility] ENDOSCOPY;  Service: Endoscopy;  Laterality: N/A;  . BALLOON DILATION N/A 08/07/2016   Procedure: BALLOON DILATION;  Surgeon: Clarene Essex, MD;  Location: [facility] ENDOSCOPY;  Service: Endoscopy;  Laterality: N/A;  . BALLOON DILATION N/A 05/20/2017   Procedure: BALLOON DILATION;  Surgeon: Clarene Essex, MD;  Location: [facility] ENDOSCOPY;  Service: Endoscopy;  Laterality: N/A;  . BALLOON DILATION N/A 12/02/2017   Procedure: BALLOON DILATION;  Surgeon: Clarene Essex, MD;  Location: [place];  Service: Endoscopy;  Laterality: N/A;  . CHOLECYSTECTOMY OPEN  1979  . COLONOSCOPY     Hx: of  . ESOPHAGOGASTRODUODENOSCOPY  06/16/2011   Procedure: ESOPHAGOGASTRODUODENOSCOPY (EGD);  Surgeon: Jeryl Columbia, MD;  Location[facility] ENDOSCOPY;  Service: Endoscopy;  Laterality: N/A;  . ESOPHAGOGASTRODUODENOSCOPY N/A 10/03/2013   Procedure: ESOPHAGOGASTRODUODENOSCOPY (EGD);  Surgeon: Caryl Bis  Magod, MD;  Location: [facility] ENDOSCOPY;  Service: Endoscopy;  Laterality: N/A;  . ESOPHAGOGASTRODUODENOSCOPY N/A 08/10/2014   Procedure: ESOPHAGOGASTRODUODENOSCOPY (EGD);  Surgeon: Jeryl Columbia, MD;  Location: [facility] ENDOSCOPY;  Service: Endoscopy;  Laterality: N/A;  . ESOPHAGOGASTRODUODENOSCOPY N/A 08/10/2016   Procedure: ESOPHAGOGASTRODUODENOSCOPY (EGD);  Surgeon: Clarene Essex, MD;  Location: [facility] ENDOSCOPY;  Service: Endoscopy;  Laterality: N/A;  . ESOPHAGOGASTRODUODENOSCOPY (EGD) WITH PROPOFOL N/A  02/15/2015   Procedure: ESOPHAGOGASTRODUODENOSCOPY (EGD) WITH PROPOFOL;  Surgeon: Clarene Essex, MD;  Location: [facility] ENDOSCOPY;  Service: Endoscopy;  Laterality: N/A;  . ESOPHAGOGASTRODUODENOSCOPY (EGD) WITH PROPOFOL N/A 11/12/2015   Procedure: ESOPHAGOGASTRODUODENOSCOPY (EGD) WITH PROPOFOL;  Surgeon: Clarene Essex, MD;  Location: [facility] ENDOSCOPY;  Service: Endoscopy;  Laterality: N/A;  . ESOPHAGOGASTRODUODENOSCOPY (EGD) WITH PROPOFOL N/A 08/07/2016   Procedure: ESOPHAGOGASTRODUODENOSCOPY (EGD) WITH PROPOFOL;  Surgeon: Clarene Essex, MD;  Location: [facility] ENDOSCOPY;  Service: Endoscopy;  Laterality: N/A;  . ESOPHAGOGASTRODUODENOSCOPY (EGD) WITH PROPOFOL N/A 05/20/2017   Procedure: ESOPHAGOGASTRODUODENOSCOPY (EGD) WITH PROPOFOL;  Surgeon: Clarene Essex, MD;  Location: [facility] ENDOSCOPY;  Service: Endoscopy;  Laterality: N/A;  . ESOPHAGOGASTRODUODENOSCOPY (EGD) WITH PROPOFOL N/A 12/02/2017   Procedure: ESOPHAGOGASTRODUODENOSCOPY (EGD) WITH PROPOFOL;  Surgeon: Clarene Essex, MD;  Location: [place];  Service: Endoscopy;  Laterality: N/A;  have c arm available  . FRACTURE SURGERY     Hx: of left wrist  . GASTRECTOMY  [phone]  . GASTRECTOMY N/A    X3  . LITHOTRIPSY     Hx; of for kidney stones  . SAVORY DILATION N/A 08/10/2014   Procedure: SAVORY DILATION;  Surgeon: Jeryl Columbia, MD;  Location: [facility] ENDOSCOPY;  Service: Endoscopy;  Laterality: N/A;  . TUBAL LIGATION     1975     OB History   None      Home Medications    Prior to Admission medications   Medication Sig Start Date End Date Taking? Authorizing Provider  acetaminophen (TYLENOL) 500 MG tablet Take 1,000 mg by mouth 2 (two) times daily.    [provider]  ALPRAZolam Duanne Moron) 0.5 MG tablet Take 1 tablet (0.5 mg total) by mouth 4 (four) times daily as needed for anxiety. 11/11/17   Hassell Done, Mary-Margaret, FNP  Ascorbic Acid (VITAMIN C) 500 MG CHEW Chew 500 mg by mouth 2 (two) times daily.    [provider]  CALCIUM CITRATE PO Take 600  mg by mouth 2 (two) times daily.     [provider]  Chlorphen-PE-Acetaminophen 4-10-325 MG TABS Take 1 tablet by mouth every 6 (six) hours as needed. 05/11/17   Hassell Done Mary-Margaret, FNP  cholecalciferol (VITAMIN D) 1000 units tablet Take 1,000 Units by mouth 2 (two) times daily.    [provider]  Flaxseed, Linseed, (FLAXSEED OIL) 1200 MG CAPS Take 1,200 mg by mouth daily with lunch.    [provider]  levothyroxine (SYNTHROID, LEVOTHROID) 100 MCG tablet Take 1 tablet (100 mcg total) by mouth every morning. 11/11/17   Hassell Done, Mary-Margaret, FNP  loratadine (CLARITIN) 10 MG tablet Take 10 mg by mouth 2 (two) times daily.      [provider]  magnesium gluconate (MAGONATE) 500 MG tablet Take 500 mg by mouth daily with lunch.     [provider]  Multiple Vitamins-Minerals (CENTRUM SILVER) CHEW Chew 1 tablet by mouth 2 (two) times daily.    [provider]  omeprazole (PRILOSEC) 40 MG capsule Take 1 capsule (40 mg total) by mouth daily. 11/11/17   Chevis Pretty, FNP  Probiotic Product (PROBIOTIC PO)  Take 1 capsule by mouth daily. 2 BILLION    [provider]  sertraline (ZOLOFT) 100 MG tablet Take 2 tablets (200 mg total) by mouth daily. Patient taking differently: Take 150 mg by mouth daily.  11/11/17   Hassell Done, Mary-Margaret, FNP  sucralfate (CARAFATE) 1 G tablet Take 1 g by mouth 3 (three) times daily.  09/07/11   [provider]  traMADol (ULTRAM) 50 MG tablet Take 1 tablet (50 mg total) by mouth every 12 (twelve) hours as needed. 12/21/17   Janora Norlander, DO    Family History Family History  Problem Relation Age of Onset  . Diabetes Mother   . Cancer - Prostate Brother     Social History Social History   Tobacco Use  . Smoking status: Never Smoker  . Smokeless tobacco: Never Used  Substance Use Topics  . Alcohol use: No  . Drug use: No     Allergies   Augmentin [amoxicillin-pot clavulanate];  Famotidine; Klonopin [clonazepam]; Reglan [metoclopramide]; Nitrofurantoin; and Sulfamethoxazole-trimethoprim   Review of Systems Review of Systems  All systems reviewed and negative, other than as noted in HPI.  Physical Exam Updated Vital Signs BP 113/63 (BP Location: Left Arm) Comment: Simultaneous filing. User may not have seen previous data.  Pulse 87 Comment: Simultaneous filing. User may not have seen previous data.  Temp 98 F (36.7 C) (Oral) Comment: Simultaneous filing. User may not have seen previous data. Comment (Src): Simultaneous filing. User may not have seen previous data.  Resp 16 Comment: Simultaneous filing. User may not have seen previous data.  Ht 5\' 5"  (1.651 m)   Wt 41.3 kg (91 lb)   SpO2 100% Comment: Simultaneous filing. User may not have seen previous data.  BMI 15.14 kg/m   Physical Exam  Constitutional: She appears well-developed and well-nourished. No distress.  HENT:  Head: Normocephalic and atraumatic.  Eyes: Conjunctivae are normal. Right eye exhibits no discharge. Left eye exhibits no discharge.  Neck: Neck supple.  Cardiovascular: Normal rate, regular rhythm and normal heart sounds. Exam reveals no gallop and no friction rub.  No murmur heard. Pulmonary/Chest: Effort normal and breath sounds normal. No respiratory distress.  Abdominal: Soft. She exhibits no distension. There is no tenderness.  Musculoskeletal: She exhibits no edema or tenderness.  Right lower extremity perhaps somewhat shortened as compared to the left.  Severe pain with attempted range of motion of the hip.  Palpable DP pulse bilaterally.  Sensation is intact to light touch.  Neurological: She is alert.  Skin: Skin is warm and dry.  Psychiatric: She has a normal mood and affect. Her behavior is normal. Thought content normal.  Nursing note and vitals reviewed.    ED Treatments / Results  Labs (all labs ordered are listed, but only abnormal results are displayed) Labs  Reviewed  CBC WITH DIFFERENTIAL/PLATELET - Abnormal; Notable for the following components:      Result Value   RBC 3.68 (*)    Hemoglobin 11.8 (*)    MCV 105.2 (*)    RDW 15.7 (*)    All other components within normal limits  BASIC METABOLIC PANEL - Abnormal; Notable for the following components:   Chloride 118 (*)    CO2 15 (*)    Creatinine, Ser 1.16 (*)    Calcium 7.6 (*)    GFR calc non Af Amer 48 (*)    GFR calc Af Amer 55 (*)    All other components within normal limits  CBC  BASIC METABOLIC PANEL  TYPE AND SCREEN    EKG EKG Interpretation  Date/Time:  Wednesday Dec 29 2017 16:45:04 EDT Ventricular Rate:  82 PR Interval:    QRS Duration: 83 QT Interval:  368 QTC Calculation: 430 R Axis:   51 Text Interpretation:  Sinus rhythm Anteroseptal infarct, old Confirmed by Virgel Manifold [phone]) on 12/29/2017 9:43:48 PM   Radiology Dg Chest Port 1 View  Result Date: 12/29/2017 CLINICAL DATA:  Preop for hip fracture. EXAM: PORTABLE CHEST 1 VIEW COMPARISON:  06/03/2017 FINDINGS: Normal heart size and mediastinal contours. There is no edema, consolidation, effusion, or pneumothorax. Postoperative GE junction. No acute osseous finding. IMPRESSION: No evidence of active disease. Electronically Signed   By: Monte Fantasia M.D.   On: 12/29/2017 16:25   Dg Hip Unilat With Pelvis 2-3 Views Right  Result Date: 12/29/2017 CLINICAL DATA:  Right hip pain x6 weeks EXAM: DG HIP (WITH OR WITHOUT PELVIS) 2-3V RIGHT COMPARISON:  Right hip radiographs dated 12/29/2017 at 1346 hours FINDINGS: Subcapital right hip fracture. Mild foreshortening with varus angulation. Visualized bony pelvis appears intact. Bilateral hip joint spaces are preserved. Surgical clips overlying the sacrum. IMPRESSION: Subcapital right hip fracture, as above. Electronically Signed   By: Julian Hy M.D.   On: 12/29/2017 20:43   Dg Femur, Min 2 Views Right  Result Date: 12/29/2017 CLINICAL DATA:  Acute right hip  pain EXAM: RIGHT FEMUR 2 VIEWS COMPARISON:  None in PACs FINDINGS: There is an acute subcapital fracture of the right hip with milder rotation of the femoral head with respect to the neck. The observed portions of the right hemipelvis are normal. The remainder of the femur is normal where visualized. IMPRESSION: There is an acute subcapital fracture of the right femur. These results will be called to the ordering clinician or representative by the Radiologist Assistant, and communication documented in the PACS or zVision Dashboard. Electronically Signed   By: David  Martinique M.D.   On: 12/29/2017 13:56    Procedures Procedures (including critical care time)  Medications Ordered in ED Medications - No data to display   Initial Impression / Assessment and Plan / ED Course  I have reviewed the triage vital signs and the nursing notes.  Pertinent labs & imaging results that were available during my care of the patient were reviewed by me and considered in my medical decision making (see chart for details).     67 year old female with right subcapital femur fracture.  Discussed with Dr. Aline Brochure, orthopedic surgery.  Admit to hospitalist service.  N.p.o. after midnight.  Final Clinical Impressions(s) / ED Diagnoses   Final diagnoses:  Hip fx ([place])  Pre-op exam  Hip fracture [facility])  Hip fracture, right, closed, initial encounter [facility])    ED Discharge Orders    None       Virgel Manifold, MD 12/29/17 2144

## 2017-12-29 NOTE — H&P (Signed)
History and Physical    Brittany Hensley PNT:[id number] DOB: 1951/07/06 DOA: 12/29/2017  PCP: Chevis Pretty, FNP  Patient coming from: Home  Chief Complaint: Hip pain  HPI: Brittany Hensley is a 67 y.o. female with medical history significant of anxiety comes in with 6 weeks of worsening right leg pain for about 6 weeks now.  She has been walking on it and it has been painful.  She has been doing outpt PT and it has not improved.  Her PCP did xrays today which showed a right femur fracture and sent to ED.  Pt otherwise in normal state of health.  Dr Aline Brochure called and will plan for OR tomorrow for repair.   Review of Systems: As per HPI otherwise 10 point review of systems negative.   Past Medical History:  Diagnosis Date  . Anemia    iron   . Anxiety   . Diverticulitis    Hx: of  . GERD (gastroesophageal reflux disease)    at times  . History of kidney stones    10 years ago  . Hypothyroidism   . Low iron   . Pneumonia   . Rosacea    Hx: of    Past Surgical History:  Procedure Laterality Date  . APPENDECTOMY    . BALLOON DILATION  06/16/2011   Procedure: BALLOON DILATION;  Surgeon: Jeryl Columbia, MD;  Location: [place] ENDOSCOPY;  Service: Endoscopy;  Laterality: N/A;  . BALLOON DILATION N/A 10/03/2013   Procedure: BALLOON DILATION;  Surgeon: Jeryl Columbia, MD;  [facility];  Service: Endoscopy;  Laterality: N/A;  . BALLOON DILATION N/A 08/10/2014   Procedure: BALLOON DILATION;  Surgeon: Jeryl Columbia, MD;  [facility];  Service: Endoscopy;  Laterality: N/A;  from 12 - 15 cm dilation completed  . BALLOON DILATION N/A 02/15/2015   Procedure: BALLOON DILATION;  Surgeon: Clarene Essex, MD;  [facility];  Service: Endoscopy;  Laterality: N/A;  . BALLOON DILATION N/A 11/12/2015   Procedure: BALLOON DILATION;  Surgeon: Clarene Essex, MD;  [facility];  Service: Endoscopy;  Laterality: N/A;  . BALLOON DILATION N/A 08/10/2016   Procedure: BALLOON  DILATION;  Surgeon: Clarene Essex, MD;  Location[facility]COPY;  Service: Endoscopy;  Laterality: N/A;  . BALLOON DILATION N/A 08/07/2016   Procedure: BALLOON DILATION;  Surgeon: Clarene Essex, MD;  Location: [facility] ENDOSCOPY;  Service: Endoscopy;  Laterality: N/A;  . BALLOON DILATION N/A 05/20/2017   Procedure: BALLOON DILATION;  Surgeon: Clarene Essex, MD;  [facility];  Service: Endoscopy;  Laterality: N/A;  . BALLOON DILATION N/A 12/02/2017   Procedure: BALLOON DILATION;  Surgeon: Clarene Essex, MD;  Location: [place];  Service: Endoscopy;  Laterality: N/A;  . CHOLECYSTECTOMY OPEN  1979  . COLONOSCOPY     Hx: of  . ESOPHAGOGASTRODUODENOSCOPY  06/16/2011   Procedure: ESOPHAGOGASTRODUODENOSCOPY (EGD);  Surgeon: Jeryl Columbia, MD;  Location: [facility] ENDOSCOPY;  Service: Endoscopy;  Laterality: N/A;  . ESOPHAGOGASTRODUODENOSCOPY N/A 10/03/2013   Procedure: ESOPHAGOGASTRODUODENOSCOPY (EGD);  Surgeon: Jeryl Columbia, MD;  Location: [facility];  Service: Endoscopy;  Laterality: N/A;  . ESOPHAGOGASTRODUODENOSCOPY N/A 08/10/2014   Procedure: ESOPHAGOGASTRODUODENOSCOPY (EGD);  Surgeon: Jeryl Columbia, MD;  Location: [facility]OSCOPY;  Service: Endoscopy;  Laterality: N/A;  . ESOPHAGOGASTRODUODENOSCOPY N/A 08/10/2016   Procedure: ESOPHAGOGASTRODUODENOSCOPY (EGD);  Surgeon: Clarene Essex, MD;  Location: [facility] ENDOSCOPY;  Service: Endoscopy;  Laterality: N/A;  . ESOPHAGOGASTRODUODENOSCOPY (EGD) WITH PROPOFOL N/A 02/15/2015   Procedure: ESOPHAGOGASTRODUODENOSCOPY (EGD) WITH  PROPOFOL;  Surgeon: Clarene Essex, MD;  [facility];  Service: Endoscopy;  Laterality: N/A;  . ESOPHAGOGASTRODUODENOSCOPY (EGD) WITH PROPOFOL N/A 11/12/2015   Procedure: ESOPHAGOGASTRODUODENOSCOPY (EGD) WITH PROPOFOL;  Surgeon: Clarene Essex, MD;  [facility];  Service: Endoscopy;  Laterality: N/A;  . ESOPHAGOGASTRODUODENOSCOPY (EGD) WITH PROPOFOL N/A 08/07/2016   Procedure: ESOPHAGOGASTRODUODENOSCOPY (EGD) WITH PROPOFOL;  Surgeon: Clarene Essex, MD;   Location: [facility] ENDOSCOPY;  Service: Endoscopy;  Laterality: N/A;  . ESOPHAGOGASTRODUODENOSCOPY (EGD) WITH PROPOFOL N/A 05/20/2017   Procedure: ESOPHAGOGASTRODUODENOSCOPY (EGD) WITH PROPOFOL;  Surgeon: Clarene Essex, MD;  [facility];  Service: Endoscopy;  Laterality: N/A;  . ESOPHAGOGASTRODUODENOSCOPY (EGD) WITH PROPOFOL N/A 12/02/2017   Procedure: ESOPHAGOGASTRODUODENOSCOPY (EGD) WITH PROPOFOL;  Surgeon: Clarene Essex, MD;  Location: [place];  Service: Endoscopy;  Laterality: N/A;  have c arm available  . FRACTURE SURGERY     Hx: of left wrist  . GASTRECTOMY  [phone]  . GASTRECTOMY N/A    X3  . LITHOTRIPSY     Hx; of for kidney stones  . SAVORY DILATION N/A 08/10/2014   Procedure: SAVORY DILATION;  Surgeon: Jeryl Columbia, MD;  [facility];  Service: Endoscopy;  Laterality: N/A;  . [place]     reports that she has never smoked. She has never used smokeless tobacco. She reports that she does not drink alcohol or use drugs.  Allergies  Allergen Reactions  . Augmentin [Amoxicillin-Pot Clavulanate] Other (See Comments)    Side effects include loss of blood  . Famotidine Other (See Comments)    Fever   . Klonopin [Clonazepam] Other (See Comments)    REACTION: double vision  . Reglan [Metoclopramide]     Causes confusion, anxiety  . Nitrofurantoin Rash  . Sulfamethoxazole-Trimethoprim Rash    Family History  Problem Relation Age of Onset  . Diabetes Mother   . Cancer - Prostate Brother     Prior to Admission medications   Medication Sig Start Date End Date Taking? Authorizing Provider  acetaminophen (TYLENOL) 500 MG tablet Take 1,000 mg by mouth 2 (two) times daily.    [provider]  ALPRAZolam Duanne Moron) 0.5 MG tablet Take 1 tablet (0.5 mg total) by mouth 4 (four) times daily as needed for anxiety. 11/11/17   Hassell Done, Mary-Margaret, FNP  Ascorbic Acid (VITAMIN C) 500 MG CHEW Chew 500 mg by mouth 2 (two) times daily.    [provider]  CALCIUM CITRATE PO Take 600 mg by mouth 2 (two) times daily.     [provider]  Chlorphen-PE-Acetaminophen 4-10-325 MG TABS Take 1 tablet by mouth every 6 (six) hours as needed. 05/11/17   Hassell Done Mary-Margaret, FNP  cholecalciferol (VITAMIN D) 1000 units tablet Take 1,000 Units by mouth 2 (two) times daily.    [provider]  Flaxseed, Linseed, (FLAXSEED OIL) 1200 MG CAPS Take 1,200 mg by mouth daily with lunch.    [provider]  levothyroxine (SYNTHROID, LEVOTHROID) 100 MCG tablet Take 1 tablet (100 mcg total) by mouth every morning. 11/11/17   Hassell Done, Mary-Margaret, FNP  loratadine (CLARITIN) 10 MG tablet Take 10 mg by mouth 2 (two) times daily.      [provider]  magnesium gluconate (MAGONATE) 500 MG tablet Take 500 mg by mouth daily with lunch.     [provider]  Multiple Vitamins-Minerals (CENTRUM SILVER) CHEW Chew 1 tablet by mouth 2 (two) times daily.    [provider]  omeprazole (PRILOSEC) 40  MG capsule Take 1 capsule (40 mg total) by mouth daily. 11/11/17   Hassell Done Mary-Margaret, FNP  Probiotic Product (PROBIOTIC PO) Take 1 capsule by mouth daily. 2 BILLION    [provider]  sertraline (ZOLOFT) 100 MG tablet Take 2 tablets (200 mg total) by mouth daily. Patient taking differently: Take 100 mg by mouth daily.  11/11/17   Hassell Done, Mary-Margaret, FNP  sucralfate (CARAFATE) 1 G tablet Take 1 g by mouth 3 (three) times daily.  09/07/11   [provider]  traMADol (ULTRAM) 50 MG tablet Take 1 tablet (50 mg total) by mouth every 12 (twelve) hours as needed. 12/21/17   Janora Norlander, DO    Physical Exam: Vitals:   12/29/17 1520 12/29/17 1522 12/29/17 1530 12/29/17 1700  BP: 113/63  132/66   Pulse: 87  82 79  Resp: 16   13  Temp: 98 F (36.7 C)     TempSrc: Oral     SpO2: 100%  100% 100%  Weight:  41.3 kg (91 lb)    Height:  5\' 5"  (1.651 m)        Constitutional: NAD, calm,  comfortable Vitals:   12/29/17 1520 12/29/17 1522 12/29/17 1530 12/29/17 1700  BP: 113/63  132/66   Pulse: 87  82 79  Resp: 16   13  Temp: 98 F (36.7 C)     TempSrc: Oral     SpO2: 100%  100% 100%  Weight:  41.3 kg (91 lb)    Height:  5\' 5"  (1.651 m)     Eyes: PERRL, lids and conjunctivae normal ENMT: Mucous membranes are moist. Posterior pharynx clear of any exudate or lesions.Normal dentition.  Neck: normal, supple, no masses, no thyromegaly Respiratory: clear to auscultation bilaterally, no wheezing, no crackles. Normal respiratory effort. No accessory muscle use.  Cardiovascular: Regular rate and rhythm, no murmurs / rubs / gallops. No extremity edema. 2+ pedal pulses. No carotid bruits.  Abdomen: no tenderness, no masses palpated. No hepatosplenomegaly. Bowel sounds positive.  Musculoskeletal: no clubbing / cyanosis. No joint deformity upper and lower extremities. Good ROM, no contractures. Normal muscle tone.  Skin: no rashes, lesions, ulcers. No induration Neurologic: CN 2-12 grossly intact. Sensation intact, DTR normal. Strength 5/5 in all 4.  Psychiatric: Normal judgment and insight. Alert and oriented x 3. Normal mood.    Labs on Admission: I have personally reviewed following labs and imaging studies  CBC: Recent Labs  Lab 12/29/17 1556  WBC 4.8  NEUTROABS 3.5  HGB 11.8*  HCT 38.7  MCV 105.2*  PLT 161   Basic Metabolic Panel: Recent Labs  Lab 12/29/17 1556  NA 143  K 3.8  CL 118*  CO2 15*  GLUCOSE 89  BUN 20  CREATININE 1.16*  CALCIUM 7.6*   GFR: Estimated Creatinine Clearance: 30.7 mL/min (A) (by C-G formula based on SCr of 1.16 mg/dL (H)). Liver Function Tests: No results for input(s): AST, ALT, ALKPHOS, BILITOT, PROT, ALBUMIN in the last 168 hours. No results for input(s): LIPASE, AMYLASE in the last 168 hours. No results for input(s): AMMONIA in the last 168 hours. Coagulation Profile: No results for input(s): INR, PROTIME in the last 168  hours. Cardiac Enzymes: No results for input(s): CKTOTAL, CKMB, CKMBINDEX, TROPONINI in the last 168 hours. BNP (last 3 results) No results for input(s): PROBNP in the last 8760 hours. HbA1C: No results for input(s): HGBA1C in the last 72 hours. CBG: No results for input(s): GLUCAP in the last 168 hours.  Lipid Profile: No results for input(s): CHOL, HDL, LDLCALC, TRIG, CHOLHDL, LDLDIRECT in the last 72 hours. Thyroid Function Tests: No results for input(s): TSH, T4TOTAL, FREET4, T3FREE, THYROIDAB in the last 72 hours. Anemia Panel: No results for input(s): VITAMINB12, FOLATE, FERRITIN, TIBC, IRON, RETICCTPCT in the last 72 hours. Urine analysis:    Component Value Date/Time   COLORURINE YELLOW 02/16/2007 1911   APPEARANCEUR CLEAR 02/16/2007 1911   LABSPEC 1.020 02/16/2007 1911   PHURINE 6.0 02/16/2007 1911   GLUCOSEU NEGATIVE 02/16/2007 1911   HGBUR NEGATIVE 02/16/2007 1911   BILIRUBINUR neg 03/27/2014 1241   KETONESUR NEGATIVE 02/16/2007 1911   PROTEINUR trace 03/27/2014 1241   PROTEINUR NEGATIVE 02/16/2007 1911   UROBILINOGEN negative 03/27/2014 1241   UROBILINOGEN 0.2 02/16/2007 1911   NITRITE neg 03/27/2014 1241   NITRITE NEGATIVE 02/16/2007 1911   LEUKOCYTESUR Negative 03/27/2014 1241   Sepsis Labs: !!!!!!!!!!!!!!!!!!!!!!!!!!!!!!!!!!!!!!!!!!!! @LABRCNTIP (procalcitonin:4,lacticidven:4) )No results found for this or any previous visit (from the past 240 hour(s)).   Radiological Exams on Admission: Dg Chest Port 1 View  Result Date: 12/29/2017 CLINICAL DATA:  Preop for hip fracture. EXAM: PORTABLE CHEST 1 VIEW COMPARISON:  06/03/2017 FINDINGS: Normal heart size and mediastinal contours. There is no edema, consolidation, effusion, or pneumothorax. Postoperative GE junction. No acute osseous finding. IMPRESSION: No evidence of active disease. Electronically Signed   By: Monte Fantasia M.D.   On: 12/29/2017 16:25   Dg Femur, Min 2 Views Right  Result Date:  12/29/2017 CLINICAL DATA:  Acute right hip pain EXAM: RIGHT FEMUR 2 VIEWS COMPARISON:  None in PACs FINDINGS: There is an acute subcapital fracture of the right hip with milder rotation of the femoral head with respect to the neck. The observed portions of the right hemipelvis are normal. The remainder of the femur is normal where visualized. IMPRESSION: There is an acute subcapital fracture of the right femur. These results will be called to the ordering clinician or representative by the Radiologist Assistant, and communication documented in the PACS or zVision Dashboard. Electronically Signed   By: Amily Depp  [place] M.D.   On: 12/29/2017 13:56    EKG: Independently reviewed.  Normal sinus rhythm Old chart reviewed Case discussed with EDP Dr. Wilson Singer Chest x-ray reviewed no edema or infiltrate  Assessment/Plan 67 yo female with right femur fracture  Principal Problem:   Femur fracture, right ([place])- npo after midnight. One dose of lovenox ordered for today.  OR tomorrow at some point.  Address post op dvt prophylaxis post op.  Hip fracture pathway started,.  Muscle relaxers and pain meds ordered.  Pre op ekg nsr.  Active Problems:   Hypothyroidism- noted, cont synthroid   GERD (gastroesophageal reflux disease)- cont ppi   GAD (generalized anxiety disorder)- cont prn xanax     DVT prophylaxis: 1 dose of Lovenox tonight and SCDs Code Status: Full Family Communication: None Disposition Plan: Per day team Consults called: Orthopedic surgery Admission status: Admission   Darah Simkin A MD [facility]  If 7PM-7AM, please contact night-coverage [email or web address] Password [facility]  12/29/2017, 5:23 PM

## 2017-12-29 NOTE — ED Triage Notes (Addendum)
Patient had right hip pain for 6 weeks, went to pcp today, xray showed fractured right hip.  Denies fall, states was moving boxes and "moved wrong".

## 2017-12-29 NOTE — Therapy (Addendum)
[facility] [place], [place], [address] Phone: [phone]   Fax:  [phone]  Physical Therapy Treatment  Patient Details  Name: Brittany Hensley MRN: [id number] Date of Birth: March 02, 1951 Referring Provider: Ronnie Doss DO   Encounter Date: 12/29/2017  PT End of Session - 12/29/17 1251    Visit Number  2    Number of Visits  12    Date for PT Re-Evaluation  02/02/18    PT Start Time  1230    PT Stop Time  7096    PT Time Calculation (min)  43 min    Activity Tolerance  Patient limited by pain;Patient tolerated treatment well    Behavior During Therapy  [facility] for tasks assessed/performed       Past Medical History:  Diagnosis Date  . Anemia    iron   . Anxiety   . Diverticulitis    Hx: of  . GERD (gastroesophageal reflux disease)    at times  . History of kidney stones    10 years ago  . Hypothyroidism   . Low iron   . Pneumonia   . Rosacea    Hx: of    Past Surgical History:  Procedure Laterality Date  . APPENDECTOMY    . BALLOON DILATION  06/16/2011   Procedure: BALLOON DILATION;  Surgeon: Jeryl Columbia, MD;  Location: [facility] ENDOSCOPY;  Service: Endoscopy;  Laterality: N/A;  . BALLOON DILATION N/A 10/03/2013   Procedure: BALLOON DILATION;  Surgeon: Jeryl Columbia, MD;  [facility];  Service: Endoscopy;  Laterality: N/A;  . BALLOON DILATION N/A 08/10/2014   Procedure: BALLOON DILATION;  Surgeon: Jeryl Columbia, MD;  [facility];  Service: Endoscopy;  Laterality: N/A;  from 12 - 15 cm dilation completed  . BALLOON DILATION N/A 02/15/2015   Procedure: BALLOON DILATION;  Surgeon: Clarene Essex, MD;  [facility]OS[facility];  Service: Endoscopy;  Laterality: N/A;  . BALLOON DILATION N/A 11/12/2015   Procedure: BALLOON DILATION;  Surgeon: Clarene Essex, MD;  [facility];  Service: Endoscopy;  Laterality: N/A;  . BALLOON DILATION N/A 08/10/2016   Procedure: BALLOON DILATION;  Surgeon: Clarene Essex, MD;  Location: [facility] ENDOSCOPY;  Service: Endoscopy;  Laterality: N/A;  . BALLOON DILATION N/A 08/07/2016   Procedure: BALLOON DILATION;  Surgeon: Clarene Essex, MD;  Location: [facility] ENDOSCOPY;  Service: Endoscopy;  Laterality: N/A;  . BALLOON DILATION N/A 05/20/2017   Procedure: BALLOON DILATION;  Surgeon: Clarene Essex, MD;  [facility];  Service: Endoscopy;  Laterality: N/A;  . BALLOON DILATION N/A 12/02/2017   Procedure: BALLOON DILATION;  Surgeon: Clarene Essex, MD;  Location: [place];  Service: Endoscopy;  Laterality: N/A;  . CHOLECYSTECTOMY OPEN  1979  . COLONOSCOPY     Hx: of  . ESOPHAGOGASTRODUODENOSCOPY  06/16/2011   Procedure: ESOPHAGOGASTRODUODENOSCOPY (EGD);  Surgeon: Jeryl Columbia, MD;  Location: [facility] ENDOSCOPY;  Service: Endoscopy;  Laterality: N/A;  . ESOPHAGOGASTRODUODENOSCOPY N/A 10/03/2013   Procedure: ESOPHAGOGASTRODUODENOSCOPY (EGD);  Surgeon: Jeryl Columbia, MD;  Location: [facility]OSCOPY;  Service: Endoscopy;  Laterality: N/A;  . ESOPHAGOGASTRODUODENOSCOPY N/A 08/10/2014   Procedure: ESOPHAGOGASTRODUODENOSCOPY (EGD);  Surgeon: Jeryl Columbia, MD;  Location: [facility] ENDOSCOPY;  Service: Endoscopy;  Laterality: N/A;  . ESOPHAGOGASTRODUODENOSCOPY N/A 08/10/2016   Procedure: ESOPHAGOGASTRODUODENOSCOPY (EGD);  Surgeon: Clarene Essex, MD;  Location: [facility] ENDOSCOPY;  Service: Endoscopy;  Laterality: N/A;  . ESOPHAGOGASTRODUODENOSCOPY (EGD) WITH PROPOFOL N/A 02/15/2015   Procedure: ESOPHAGOGASTRODUODENOSCOPY (EGD) WITH PROPOFOL;  Surgeon: Clarene Essex, MD;  Location: [facility] ENDOSCOPY;  Service: Endoscopy;  Laterality: N/A;  . ESOPHAGOGASTRODUODENOSCOPY (EGD) WITH PROPOFOL N/A 11/12/2015   Procedure: ESOPHAGOGASTRODUODENOSCOPY (EGD) WITH PROPOFOL;  Surgeon: Clarene Essex, MD;  [facility];  Service: Endoscopy;  Laterality: N/A;  . ESOPHAGOGASTRODUODENOSCOPY (EGD) WITH PROPOFOL N/A 08/07/2016   Procedure: ESOPHAGOGASTRODUODENOSCOPY (EGD) WITH PROPOFOL;  Surgeon: Clarene Essex, MD;  Location: [facility] ENDOSCOPY;   Service: Endoscopy;  Laterality: N/A;  . ESOPHAGOGASTRODUODENOSCOPY (EGD) WITH PROPOFOL N/A 05/20/2017   Procedure: ESOPHAGOGASTRODUODENOSCOPY (EGD) WITH PROPOFOL;  Surgeon: Clarene Essex, MD;  [facility];  Service: Endoscopy;  Laterality: N/A;  . ESOPHAGOGASTRODUODENOSCOPY (EGD) WITH PROPOFOL N/A 12/02/2017   Procedure: ESOPHAGOGASTRODUODENOSCOPY (EGD) WITH PROPOFOL;  Surgeon: Clarene Essex, MD;  Location: [place];  Service: Endoscopy;  Laterality: N/A;  have c arm available  . FRACTURE SURGERY     Hx: of left wrist  . GASTRECTOMY  [phone]  . GASTRECTOMY N/A    X3  . LITHOTRIPSY     Hx; of for kidney stones  . SAVORY DILATION N/A 08/10/2014   Procedure: SAVORY DILATION;  Surgeon: Jeryl Columbia, MD;  Location[facility] ENDOSCOPY;  Service: Endoscopy;  Laterality: N/A;  . TUBAL LIGATION     1975    There were no vitals filed for this visit.  Subjective Assessment - 12/29/17 1229    Subjective  Patient arrived with a lot of pain and antalgic gait    Limitations  Walking    How long can you walk comfortably?  Short community distances.  Patient was grasping her right thigh as she was walking in the clinc today.    Patient Stated Goals  be bale to finish usual daily tasks, cleaning, sweepin and mopping.    Currently in Pain?  Yes    Pain Score  10-Worst pain ever    Pain Location  Leg    Pain Orientation  Right    Pain Descriptors / Indicators  Aching;Discomfort    Pain Type  Acute pain    Pain Onset  More than a month ago    Pain Frequency  Intermittent    Aggravating Factors   weight bearing    Pain Relieving Factors  at rest                       [facility] Adult PT Treatment/Exercise - 12/29/17 0001      Modalities   Modalities  Electrical Stimulation;Moist Heat;Ultrasound      Moist Heat Therapy   Number Minutes Moist Heat  15 Minutes    Moist Heat Location  Other (comment) RT ant thigh      Electrical Stimulation   Electrical Stimulation Location   Right anterior thigh.    Electrical Stimulation Action  IFC    Electrical Stimulation Parameters  80-150hx 37mn    Electrical Stimulation Goals  Pain      Ultrasound   Ultrasound Location  right ant thigh    Ultrasound Parameters  combo US/ES 1.5w/cm2/50%/126m x1021m   Ultrasound Goals  Pain      Manual Therapy   Manual Therapy  Soft tissue mobilization    Manual therapy comments  gentle manual STW to right ant thigh to decrease pain                  PT Long Term Goals - 12/22/17 1409      PT LONG TERM GOAL #1   Title  Independent with an HEP.  Time  6    Period  Weeks    Status  New      PT LONG TERM GOAL #2   Title  Walk without antalgia.    Time  6    Period  Weeks    Status  New      PT LONG TERM GOAL #3   Title  Perform ADL's with pain not > 2/10.    Time  6    Period  Weeks    Status  New            Plan - 12/29/17 1305    Clinical Impression Statement  Patient tolerated treatment fair due to increased pain. Patient arrived with an antalgic and unable to perform any exercises today. Focused on manual STW and modalities to decrease pain. patient has 10/10 pain with any weight bearing and relief at rest. Patient unable to lift right LE without assist. Goals ongoing.     Rehab Potential  Good    PT Frequency  2x / week    PT Duration  6 weeks    PT Treatment/Interventions  ADLs/Self Care Home Management;Cryotherapy;Electrical Stimulation;Ultrasound;Moist Heat;Therapeutic activities;Therapeutic exercise;Patient/family education;Vasopneumatic Device;Manual techniques    PT Next Visit Plan  Combo e'stim/U/S; STW/M; HMP; E'stim; vasopneumatic.  Pain-free right quad exercises; SLR's (no pain).    Consulted and Agree with Plan of Care  Patient       Patient will benefit from skilled therapeutic intervention in order to improve the following deficits and impairments:  Abnormal gait, Decreased activity tolerance, Difficulty walking, Pain  Visit  Diagnosis: Pain in right thigh     Problem List Patient Active Problem List   Diagnosis Date Noted  . Abnormal x-ray of lung 05/21/2017  . BMI less than 19,adult 01/10/2016  . GAD (generalized anxiety disorder) 01/10/2016  . 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  . Hypothyroidism 04/24/2013  . GERD (gastroesophageal reflux disease) 04/24/2013  . Rosacea 04/24/2013  . Iron deficiency anemia 06/09/2012  . Gastric outlet obstruction 06/16/2011  . Atrioventricular nodal re-entry tachycardia ([place]) 03/11/2010  . MURMUR 03/11/2010  . PERSONAL HISTORY OF URINARY CALCULI 02/14/2008    Estelle Greenleaf P, PTA 12/29/2017, 1:24 PM  [facility] [place], [place], [address] Phone: [phone]   Fax:  [phone]  Name: MARQUESA RATH MRN: [id number] Date of Birth: 13-Jul-1951  PHYSICAL THERAPY DISCHARGE SUMMARY  Visits from Start of Care: 2.  Current functional level related to goals / functional outcomes: See above.   Remaining deficits: See below.   Education / Equipment: HEP. Plan: Patient agrees to discharge.  Patient goals were not met. Patient is being discharged due to a change in medical status.  ?????         Mali Applegate MPT

## 2017-12-30 ENCOUNTER — Inpatient Hospital Stay (HOSPITAL_COMMUNITY): Payer: PPO | Admitting: Anesthesiology

## 2017-12-30 ENCOUNTER — Encounter (HOSPITAL_COMMUNITY): Payer: Self-pay | Admitting: *Deleted

## 2017-12-30 ENCOUNTER — Inpatient Hospital Stay (HOSPITAL_COMMUNITY): Payer: PPO

## 2017-12-30 ENCOUNTER — Encounter (HOSPITAL_COMMUNITY)
Admission: EM | Disposition: A | Discharge status: Skilled Nursing Facility | Payer: Self-pay | Source: Home / Self Care | Attending: Orthopedic Surgery

## 2017-12-30 DIAGNOSIS — S72001G Fracture of unspecified part of neck of right femur, subsequent encounter for closed fracture with delayed healing: Secondary | ICD-10-CM

## 2017-12-30 DIAGNOSIS — S72001A Fracture of unspecified part of neck of right femur, initial encounter for closed fracture: Secondary | ICD-10-CM

## 2017-12-30 HISTORY — PX: TOTAL HIP ARTHROPLASTY: SHX124

## 2017-12-30 LAB — BASIC METABOLIC PANEL
ANION GAP: 7 (ref 5–15)
BUN: 17 mg/dL (ref 6–20)
CHLORIDE: 117 mmol/L — AB (ref 101–111)
CO2: 17 mmol/L — AB (ref 22–32)
CREATININE: 1.04 mg/dL — AB (ref 0.44–1.00)
Calcium: 7.6 mg/dL — ABNORMAL LOW (ref 8.9–10.3)
GFR calc non Af Amer: 54 mL/min — ABNORMAL LOW (ref >60)
Glucose, Bld: 78 mg/dL (ref 65–99)
Potassium: 3.6 mmol/L (ref 3.5–5.1)
Sodium: 141 mmol/L (ref 135–145)

## 2017-12-30 LAB — CBC
HEMATOCRIT: 36.3 % (ref 36.0–46.0)
HEMOGLOBIN: 11.4 g/dL — AB (ref 12.0–15.0)
MCH: 33.1 pg (ref 26.0–34.0)
MCHC: 31.4 g/dL (ref 30.0–36.0)
MCV: 105.5 fL — ABNORMAL HIGH (ref 78.0–100.0)
Platelets: 376 10*3/uL (ref 150–400)
RBC: 3.44 MIL/uL — ABNORMAL LOW (ref 3.87–5.11)
RDW: 15.7 % — ABNORMAL HIGH (ref 11.5–15.5)
WBC: 3.9 10*3/uL — ABNORMAL LOW (ref 4.0–10.5)

## 2017-12-30 LAB — SURGICAL PCR SCREEN
MRSA, PCR: POSITIVE — AB
STAPHYLOCOCCUS AUREUS: POSITIVE — AB

## 2017-12-30 LAB — PREPARE RBC (CROSSMATCH)

## 2017-12-30 SURGERY — ARTHROPLASTY, HIP, TOTAL,POSTERIOR APPROACH
Anesthesia: General | Site: Hip | Laterality: Right

## 2017-12-30 MED ORDER — EPHEDRINE SULFATE 50 MG/ML IJ SOLN
INTRAMUSCULAR | Status: DC | PRN
  Administered 2017-12-30: 15 mg via INTRAVENOUS
  Administered 2017-12-30: 10 mg via INTRAVENOUS

## 2017-12-30 MED ORDER — PROPOFOL 10 MG/ML IV BOLUS
INTRAVENOUS | Status: AC
  Filled 2017-12-30: qty 20

## 2017-12-30 MED ORDER — ROCURONIUM BROMIDE 50 MG/5ML IV SOLN
INTRAVENOUS | Status: AC
  Filled 2017-12-30: qty 1

## 2017-12-30 MED ORDER — TRAMADOL HCL 50 MG PO TABS
50.0000 mg | ORAL_TABLET | Freq: Four times a day (QID) | ORAL | Status: DC
  Administered 2017-12-30 – 2018-01-03 (×13): 50 mg via ORAL
  Filled 2017-12-30 (×13): qty 1

## 2017-12-30 MED ORDER — ONDANSETRON HCL 4 MG PO TABS: 4.0000 mg | ORAL_TABLET | Freq: Four times a day (QID) | ORAL | Status: DC | PRN

## 2017-12-30 MED ORDER — CHLORHEXIDINE GLUCONATE CLOTH 2 % EX PADS
6.0000 | MEDICATED_PAD | Freq: Every day | CUTANEOUS | Status: DC
  Administered 2017-12-31 – 2018-01-03 (×4): 6 via TOPICAL

## 2017-12-30 MED ORDER — VANCOMYCIN HCL IN DEXTROSE 1-5 GM/200ML-% IV SOLN
1000.0000 mg | INTRAVENOUS | Status: DC
  Filled 2017-12-30: qty 200

## 2017-12-30 MED ORDER — ACETAMINOPHEN 500 MG PO TABS
500.0000 mg | ORAL_TABLET | Freq: Four times a day (QID) | ORAL | Status: AC
  Administered 2017-12-30 – 2017-12-31 (×3): 500 mg via ORAL
  Filled 2017-12-30 (×3): qty 1

## 2017-12-30 MED ORDER — PHENYLEPHRINE 40 MCG/ML (10ML) SYRINGE FOR IV PUSH (FOR BLOOD PRESSURE SUPPORT)
PREFILLED_SYRINGE | INTRAVENOUS | Status: AC
  Filled 2017-12-30: qty 10

## 2017-12-30 MED ORDER — GABAPENTIN 300 MG PO CAPS
300.0000 mg | ORAL_CAPSULE | Freq: Three times a day (TID) | ORAL | Status: DC
  Administered 2017-12-30 – 2018-01-03 (×12): 300 mg via ORAL
  Filled 2017-12-30 (×12): qty 1

## 2017-12-30 MED ORDER — WARFARIN - PHYSICIAN DOSING INPATIENT
Freq: Every day | Status: DC
  Administered 2017-12-31 – 2018-01-02 (×3)

## 2017-12-30 MED ORDER — MENTHOL 3 MG MT LOZG: 1.0000 | LOZENGE | OROMUCOSAL | Status: DC | PRN

## 2017-12-30 MED ORDER — WARFARIN SODIUM 2.5 MG PO TABS
2.5000 mg | ORAL_TABLET | Freq: Once | ORAL | Status: AC
  Administered 2017-12-30: 2.5 mg via ORAL
  Filled 2017-12-30: qty 1

## 2017-12-30 MED ORDER — VANCOMYCIN HCL IN DEXTROSE 1-5 GM/200ML-% IV SOLN
1000.0000 mg | Freq: Two times a day (BID) | INTRAVENOUS | Status: AC
  Administered 2017-12-30: 1000 mg via INTRAVENOUS
  Filled 2017-12-30: qty 200

## 2017-12-30 MED ORDER — SODIUM CHLORIDE 0.9 % IV SOLN
INTRAVENOUS | Status: DC | PRN
  Administered 2017-12-30: 1000 mg via INTRAVENOUS

## 2017-12-30 MED ORDER — SODIUM CHLORIDE 0.9 % IJ SOLN
INTRAMUSCULAR | Status: AC
  Filled 2017-12-30: qty 40

## 2017-12-30 MED ORDER — LACTATED RINGERS IV SOLN
INTRAVENOUS | Status: DC | PRN
  Administered 2017-12-30: 10:00:00 via INTRAVENOUS

## 2017-12-30 MED ORDER — SUGAMMADEX SODIUM 500 MG/5ML IV SOLN
INTRAVENOUS | Status: DC | PRN
  Administered 2017-12-30: 50 mg via INTRAVENOUS

## 2017-12-30 MED ORDER — ONDANSETRON HCL 4 MG/2ML IJ SOLN
4.0000 mg | Freq: Four times a day (QID) | INTRAMUSCULAR | Status: DC | PRN
  Administered 2017-12-31 – 2018-01-01 (×2): 4 mg via INTRAVENOUS
  Filled 2017-12-30 (×2): qty 2

## 2017-12-30 MED ORDER — SODIUM CHLORIDE 0.9 % IR SOLN
Status: DC | PRN
  Administered 2017-12-30: 1000 mL

## 2017-12-30 MED ORDER — SODIUM CHLORIDE 0.9 % IV SOLN
INTRAVENOUS | Status: DC | PRN
  Administered 2017-12-30: 60 mL

## 2017-12-30 MED ORDER — BUPIVACAINE-EPINEPHRINE (PF) 0.5% -1:200000 IJ SOLN
INTRAMUSCULAR | Status: AC
  Filled 2017-12-30: qty 30

## 2017-12-30 MED ORDER — FENTANYL CITRATE (PF) 100 MCG/2ML IJ SOLN
INTRAMUSCULAR | Status: AC
  Filled 2017-12-30: qty 2

## 2017-12-30 MED ORDER — HYDROCODONE-ACETAMINOPHEN 5-325 MG PO TABS
1.0000 | ORAL_TABLET | ORAL | Status: DC | PRN
  Administered 2017-12-30 – 2018-01-03 (×2): 1 via ORAL
  Filled 2017-12-30 (×2): qty 1

## 2017-12-30 MED ORDER — PHENOL 1.4 % MT LIQD: 1.0000 | OROMUCOSAL | Status: DC | PRN

## 2017-12-30 MED ORDER — BUPIVACAINE-EPINEPHRINE (PF) 0.5% -1:200000 IJ SOLN
INTRAMUSCULAR | Status: DC | PRN
  Administered 2017-12-30: 20 mL via PERINEURAL

## 2017-12-30 MED ORDER — POVIDONE-IODINE 10 % EX SWAB: 2.0000 "application " | Freq: Once | CUTANEOUS | Status: DC

## 2017-12-30 MED ORDER — FENTANYL CITRATE (PF) 100 MCG/2ML IJ SOLN: 25.0000 ug | INTRAMUSCULAR | Status: DC | PRN

## 2017-12-30 MED ORDER — ACETAMINOPHEN 325 MG PO TABS
325.0000 mg | ORAL_TABLET | Freq: Four times a day (QID) | ORAL | Status: DC | PRN
  Administered 2017-12-30 – 2018-01-02 (×4): 650 mg via ORAL
  Filled 2017-12-30 (×4): qty 2

## 2017-12-30 MED ORDER — DOCUSATE SODIUM 100 MG PO CAPS
100.0000 mg | ORAL_CAPSULE | Freq: Two times a day (BID) | ORAL | Status: DC
  Administered 2017-12-30 – 2018-01-03 (×8): 100 mg via ORAL
  Filled 2017-12-30 (×8): qty 1

## 2017-12-30 MED ORDER — FENTANYL CITRATE (PF) 100 MCG/2ML IJ SOLN
INTRAMUSCULAR | Status: DC | PRN
  Administered 2017-12-30 (×2): 25 ug via INTRAVENOUS

## 2017-12-30 MED ORDER — BUPIVACAINE LIPOSOME 1.3 % IJ SUSP
INTRAMUSCULAR | Status: AC
  Filled 2017-12-30: qty 20

## 2017-12-30 MED ORDER — ROCURONIUM BROMIDE 100 MG/10ML IV SOLN
INTRAVENOUS | Status: DC | PRN
  Administered 2017-12-30: 40 mg via INTRAVENOUS

## 2017-12-30 MED ORDER — MORPHINE SULFATE (PF) 2 MG/ML IV SOLN: 0.5000 mg | INTRAVENOUS | Status: DC | PRN

## 2017-12-30 MED ORDER — LACTATED RINGERS IV SOLN: INTRAVENOUS | Status: DC

## 2017-12-30 MED ORDER — PROPOFOL 10 MG/ML IV BOLUS
INTRAVENOUS | Status: DC | PRN
  Administered 2017-12-30: 100 mg via INTRAVENOUS

## 2017-12-30 MED ORDER — HYDROCODONE-ACETAMINOPHEN 7.5-325 MG PO TABS: 1.0000 | ORAL_TABLET | ORAL | Status: DC | PRN

## 2017-12-30 SURGICAL SUPPLY — 69 items
BIT DRILL 2.8X128 (BIT) ×2 IMPLANT
BIT DRILL 2.8X128MM (BIT) ×1
BLADE HEX COATED 2.75 (ELECTRODE) ×3 IMPLANT
BLADE SAGITTAL 25.0X1.27X90 (BLADE) ×2 IMPLANT
BLADE SAGITTAL 25.0X1.27X90MM (BLADE) ×1
BRUSH FEMORAL CANAL (MISCELLANEOUS) IMPLANT
CAPT HIP TOTAL 2 ×2 IMPLANT
CLOTH BEACON ORANGE TIMEOUT ST (SAFETY) ×3 IMPLANT
COVER LIGHT HANDLE STERIS (MISCELLANEOUS) ×6 IMPLANT
COVER PROBE W GEL 5X96 (DRAPES) ×3 IMPLANT
DECANTER SPIKE VIAL GLASS SM (MISCELLANEOUS) ×4 IMPLANT
DRAPE BACK TABLE (DRAPES) ×3 IMPLANT
DRAPE HIP W/POCKET STRL (DRAPE) ×3 IMPLANT
DRAPE INCISE IOBAN 44X35 STRL (DRAPES) ×3 IMPLANT
DRESSING AQUACEL AG ADV 3.5X12 (MISCELLANEOUS) ×1 IMPLANT
DRSG AQUACEL AG ADV 3.5X12 (MISCELLANEOUS) ×3
DRSG MEPILEX BORDER 4X12 (GAUZE/BANDAGES/DRESSINGS) ×3 IMPLANT
DURAPREP 26ML APPLICATOR (WOUND CARE) ×6 IMPLANT
ELECT REM PT RETURN 9FT ADLT (ELECTROSURGICAL) ×3
ELECTRODE REM PT RTRN 9FT ADLT (ELECTROSURGICAL) ×1 IMPLANT
EVACUATOR 3/16  PVC DRAIN (DRAIN)
EVACUATOR 3/16 PVC DRAIN (DRAIN) IMPLANT
GLOVE BIOGEL PI IND STRL 7.0 (GLOVE) ×1 IMPLANT
GLOVE BIOGEL PI INDICATOR 7.0 (GLOVE) ×2
GLOVE OPTIFIT SS 8.0 STRL (GLOVE) ×3 IMPLANT
GLOVE SKINSENSE NS SZ8.0 LF (GLOVE) ×4
GLOVE SKINSENSE STRL SZ8.0 LF (GLOVE) ×2 IMPLANT
GLOVE SS N UNI LF 8.5 STRL (GLOVE) ×3 IMPLANT
GOWN STRL REUS W/TWL LRG LVL3 (GOWN DISPOSABLE) ×9 IMPLANT
GOWN STRL REUS W/TWL XL LVL3 (GOWN DISPOSABLE) ×3 IMPLANT
HANDPIECE INTERPULSE COAX TIP (DISPOSABLE) ×3
HEMOSTAT SURGICEL 4X8 (HEMOSTASIS) ×2 IMPLANT
HOOD W/PEELAWAY (MISCELLANEOUS) ×12 IMPLANT
INST SET MAJOR BONE (KITS) ×3 IMPLANT
IV NS IRRIG 3000ML ARTHROMATIC (IV SOLUTION) ×3 IMPLANT
KIT BLADEGUARD II DBL (SET/KITS/TRAYS/PACK) ×3 IMPLANT
KIT TURNOVER KIT A (KITS) ×3 IMPLANT
MANIFOLD NEPTUNE II (INSTRUMENTS) ×3 IMPLANT
MARKER SKIN DUAL TIP RULER LAB (MISCELLANEOUS) ×3 IMPLANT
NDL HYPO 18GX1.5 BLUNT FILL (NEEDLE) ×2 IMPLANT
NDL HYPO 21X1.5 SAFETY (NEEDLE) ×1 IMPLANT
NDL HYPO 25X1 1.5 SAFETY (NEEDLE) ×1 IMPLANT
NEEDLE HYPO 18GX1.5 BLUNT FILL (NEEDLE) ×6 IMPLANT
NEEDLE HYPO 21X1.5 SAFETY (NEEDLE) ×3 IMPLANT
NEEDLE HYPO 25X1 1.5 SAFETY (NEEDLE) ×3 IMPLANT
NS IRRIG 1000ML POUR BTL (IV SOLUTION) ×3 IMPLANT
PACK TOTAL JOINT (CUSTOM PROCEDURE TRAY) ×3 IMPLANT
PAD ARMBOARD 7.5X6 YLW CONV (MISCELLANEOUS) ×3 IMPLANT
PASSER SUT SWANSON 36MM LOOP (INSTRUMENTS) IMPLANT
PILLOW HIP ABDUCTION LRG (ORTHOPEDIC SUPPLIES) IMPLANT
PILLOW HIP ABDUCTION MED (ORTHOPEDIC SUPPLIES) IMPLANT
PIN STMN SNGL STERILE 9X3.6MM (PIN) ×6 IMPLANT
SET BASIN LINEN APH (SET/KITS/TRAYS/PACK) ×3 IMPLANT
SET HNDPC FAN SPRY TIP SCT (DISPOSABLE) ×1 IMPLANT
SPONGE LAP 18X18 X RAY DECT (DISPOSABLE) ×3 IMPLANT
STAPLER VISISTAT 35W (STAPLE) ×3 IMPLANT
SUT BRALON NAB BRD #1 30IN (SUTURE) ×6 IMPLANT
SUT ETHIBOND 5 LR DA (SUTURE) ×5 IMPLANT
SUT MNCRL 0 VIOLET CTX 36 (SUTURE) ×1 IMPLANT
SUT MON AB 2-0 CT1 36 (SUTURE) ×3 IMPLANT
SUT MONOCRYL 0 CTX 36 (SUTURE) ×2
SUT VIC AB 1 CT1 27 (SUTURE) ×12
SUT VIC AB 1 CT1 27XBRD ANTBC (SUTURE) ×2 IMPLANT
SYR 20CC LL (SYRINGE) ×9 IMPLANT
SYR 30ML LL (SYRINGE) ×3 IMPLANT
SYR BULB IRRIGATION 50ML (SYRINGE) ×3 IMPLANT
TOWEL OR 17X26 4PK STRL BLUE (TOWEL DISPOSABLE) ×3 IMPLANT
TOWER CARTRIDGE SMART MIX (DISPOSABLE) IMPLANT
YANKAUER SUCT 12FT TUBE ARGYLE (SUCTIONS) ×3 IMPLANT

## 2017-12-30 NOTE — Anesthesia Postprocedure Evaluation (Signed)
Anesthesia Post Note  Patient: Brittany Hensley  Procedure(s) Performed: RIGHT TOTAL HIP ARTHROPLASTY (Right Hip)  Patient location during evaluation: PACU Anesthesia Type: General Level of consciousness: awake and alert and patient cooperative Pain management: pain level controlled Vital Signs Assessment: post-procedure vital signs reviewed and stable Respiratory status: spontaneous breathing, nonlabored ventilation and respiratory function stable Cardiovascular status: blood pressure returned to baseline Postop Assessment: no apparent nausea or vomiting Anesthetic complications: no     Last Vitals:  Vitals:   12/30/17 1300 12/30/17 1315  BP: 119/63 (P) 105/61  Pulse: 81   Resp: 12   Temp:    SpO2: 95%     Last Pain:  Vitals:   12/30/17 1300  TempSrc:   PainSc: Asleep                 Floree Zuniga J

## 2017-12-30 NOTE — Anesthesia Preprocedure Evaluation (Signed)
Anesthesia Evaluation  Patient identified by MRN, date of birth, ID band Patient awake    Reviewed: Allergy & Precautions, NPO status , Patient's Chart, lab work & pertinent test results  Airway Mallampati: II  TM Distance: >3 FB Neck ROM: Full    Dental no notable dental hx.    Pulmonary neg pulmonary ROS, pneumonia, resolved,  H/o pulm infection around 06/2017 -tx'd  Now off meds with no breathing issues    Pulmonary exam normal breath sounds clear to auscultation       Cardiovascular Exercise Tolerance: Good negative cardio ROS Normal cardiovascular examI Rhythm:Regular Rate:Normal  Denies MI or any cardiac interventions    Neuro/Psych PSYCHIATRIC DISORDERS Anxiety negative neurological ROS  negative psych ROS   GI/Hepatic negative GI ROS, Neg liver ROS, GERD  Medicated and Controlled,S/p Multiple EGDs with dialations  Per report has gain some weight after last    Endo/Other  negative endocrine ROSHypothyroidism   Renal/GU negative Renal ROS  negative genitourinary   Musculoskeletal negative musculoskeletal ROS (+) Arthritis , Osteoarthritis,  Recently dx'd fracture, has been doing PT with increasing pain on R side    Abdominal   Peds negative pediatric ROS (+)  Hematology negative hematology ROS (+) anemia ,   Anesthesia Other Findings   Reproductive/Obstetrics negative OB ROS                             Anesthesia Physical Anesthesia Plan  ASA: II  Anesthesia Plan: General   Post-op Pain Management:    Induction: Intravenous  PONV Risk Score and Plan:   Airway Management Planned: Oral ETT  Additional Equipment:   Intra-op Plan:   Post-operative Plan: Extubation in OR  Informed Consent: I have reviewed the patients History and Physical, chart, labs and discussed the procedure including the risks, benefits and alternatives for the proposed anesthesia with the patient  or authorized representative who has indicated his/her understanding and acceptance.     Plan Discussed with: CRNA  Anesthesia Plan Comments: (Offered SAB- prefers GETA - states doesn't want to know or feel anything )        Anesthesia Quick Evaluation

## 2017-12-30 NOTE — H&P (View-Only) (Signed)
Consultation-ORTHOPAEDICS  Consultation requested by medical service Ruby Cola  Reason for consultation fractured right hip  Chief complaint pain right hip  We have a 67 year old female, history of anemia and ulcers, history of gastrectomy active does her own cooking shopping driving presents with a 53-month history of pain in her right hip.  The patient says she was moving some heavy furniture and started having left leg pain over time the left leg pain resolved but the right hip became more painful.  She sought medical attention and was sent for physical therapy.  On May 29 after several week history of increasing pain she had more severe pain in physical therapy and was sent to the primary care doctor's office and eventually for x-ray of the right femur.  That x-ray showed a subcapital fracture of the right hip age-indeterminate.  She was admitted to the hospital for evaluation and treatment of right femoral neck fracture.  She complains of pain in the right hip groin and thigh which is sharp severe nonradiating exacerbated by weightbearing and internal rotation.  She lives at home alone but her son lives next door  Review of Systems  Constitutional: Negative for diaphoresis, fever, malaise/fatigue and weight loss.  HENT: Negative for hearing loss, nosebleeds, sinus pain and sore throat.   Eyes: Negative for pain and discharge.  Respiratory: Negative for cough, hemoptysis, shortness of breath and stridor.   Cardiovascular: Negative for chest pain.  Gastrointestinal: Negative for heartburn.  Genitourinary: Negative for dysuria.  Musculoskeletal: Positive for joint pain. Negative for myalgias.  Skin: Negative for rash.  Neurological: Negative for sensory change, focal weakness and seizures.  Endo/Heme/Allergies: Negative for environmental allergies and polydipsia. Does not bruise/bleed easily.  Psychiatric/Behavioral: Negative for depression, hallucinations and substance  abuse. The patient is not nervous/anxious.    Past Medical History:  Diagnosis Date  . Anemia    iron   . Anxiety   . Diverticulitis    Hx: of  . GERD (gastroesophageal reflux disease)    at times  . History of kidney stones    10 years ago  . Hypothyroidism   . Low iron   . Pneumonia   . Rosacea    Hx: of    Past Surgical History:  Procedure Laterality Date  . APPENDECTOMY    . BALLOON DILATION  06/16/2011   Procedure: BALLOON DILATION;  Surgeon: Jeryl Columbia, MD;  Location: [facility] ENDOSCOPY;  Service: Endoscopy;  Laterality: N/A;  . BALLOON DILATION N/A 10/03/2013   Procedure: BALLOON DILATION;  Surgeon: Jeryl Columbia, MD;  Location: [facility] ENDOSCOPY;  Service: Endoscopy;  Laterality: N/A;  . BALLOON DILATION N/A 08/10/2014   Procedure: BALLOON DILATION;  Surgeon: Jeryl Columbia, MD;  Location: [facility] ENDOSCOPY;  Service: Endoscopy;  Laterality: N/A;  from 12 - 15 cm dilation completed  . BALLOON DILATION N/A 02/15/2015   Procedure: BALLOON DILATION;  Surgeon: Clarene Essex, MD;  Location: [facility] ENDOSCOPY;  Service: Endoscopy;  Laterality: N/A;  . BALLOON DILATION N/A 11/12/2015   Procedure: BALLOON DILATION;  Surgeon: Clarene Essex, MD;  Location: [facility] ENDOSCOPY;  Service: Endoscopy;  Laterality: N/A;  . BALLOON DILATION N/A 08/10/2016   Procedure: BALLOON DILATION;  Surgeon: Clarene Essex, MD;  Location: [facility] ENDOSCOPY;  Service: Endoscopy;  Laterality: N/A;  . BALLOON DILATION N/A 08/07/2016   Procedure: BALLOON DILATION;  Surgeon: Clarene Essex, MD;  Location: [facility] ENDOSCOPY;  Service: Endoscopy;  Laterality: N/A;  . BALLOON DILATION N/A 05/20/2017   Procedure: BALLOON DILATION;  Surgeon: Clarene Essex, MD;  Location: [facility] ENDOSCOPY;  Service: Endoscopy;  Laterality: N/A;  . BALLOON DILATION N/A 12/02/2017   Procedure: BALLOON DILATION;  Surgeon: Clarene Essex, MD;  Location: [place];  Service: Endoscopy;  Laterality: N/A;  . CHOLECYSTECTOMY OPEN  1979  . COLONOSCOPY     Hx: of  . ESOPHAGOGASTRODUODENOSCOPY   06/16/2011   Procedure: ESOPHAGOGASTRODUODENOSCOPY (EGD);  Surgeon: Jeryl Columbia, MD;  Location: [facility] ENDOSCOPY;  Service: Endoscopy;  Laterality: N/A;  . ESOPHAGOGASTRODUODENOSCOPY N/A 10/03/2013   Procedure: ESOPHAGOGASTRODUODENOSCOPY (EGD);  Surgeon: Jeryl Columbia, MD;  Location: [facility] ENDOSCOPY;  Service: Endoscopy;  Laterality: N/A;  . ESOPHAGOGASTRODUODENOSCOPY N/A 08/10/2014   Procedure: ESOPHAGOGASTRODUODENOSCOPY (EGD);  Surgeon: Jeryl Columbia, MD;  Location: [facility] ENDOSCOPY;  Service: Endoscopy;  Laterality: N/A;  . ESOPHAGOGASTRODUODENOSCOPY N/A 08/10/2016   Procedure: ESOPHAGOGASTRODUODENOSCOPY (EGD);  Surgeon: Clarene Essex, MD;  Location: [facility] ENDOSCOPY;  Service: Endoscopy;  Laterality: N/A;  . ESOPHAGOGASTRODUODENOSCOPY (EGD) WITH PROPOFOL N/A 02/15/2015   Procedure: ESOPHAGOGASTRODUODENOSCOPY (EGD) WITH PROPOFOL;  Surgeon: Clarene Essex, MD;  Location: [facility] ENDOSCOPY;  Service: Endoscopy;  Laterality: N/A;  . ESOPHAGOGASTRODUODENOSCOPY (EGD) WITH PROPOFOL N/A 11/12/2015   Procedure: ESOPHAGOGASTRODUODENOSCOPY (EGD) WITH PROPOFOL;  Surgeon: Clarene Essex, MD;  Location: [facility] ENDOSCOPY;  Service: Endoscopy;  Laterality: N/A;  . ESOPHAGOGASTRODUODENOSCOPY (EGD) WITH PROPOFOL N/A 08/07/2016   Procedure: ESOPHAGOGASTRODUODENOSCOPY (EGD) WITH PROPOFOL;  Surgeon: Clarene Essex, MD;  Location: [facility] ENDOSCOPY;  Service: Endoscopy;  Laterality: N/A;  . ESOPHAGOGASTRODUODENOSCOPY (EGD) WITH PROPOFOL N/A 05/20/2017   Procedure: ESOPHAGOGASTRODUODENOSCOPY (EGD) WITH PROPOFOL;  Surgeon: Clarene Essex, MD;  Location: [facility] ENDOSCOPY;  Service: Endoscopy;  Laterality: N/A;  . ESOPHAGOGASTRODUODENOSCOPY (EGD) WITH PROPOFOL N/A 12/02/2017   Procedure: ESOPHAGOGASTRODUODENOSCOPY (EGD) WITH PROPOFOL;  Surgeon: Clarene Essex, MD;  Location: [place];  Service: Endoscopy;  Laterality: N/A;  have c arm available  . FRACTURE SURGERY     Hx: of left wrist  . GASTRECTOMY  [phone]  . GASTRECTOMY N/A    X3  . LITHOTRIPSY     Hx; of for  kidney stones  . SAVORY DILATION N/A 08/10/2014   Procedure: SAVORY DILATION;  Surgeon: Jeryl Columbia, MD;  Location: [facility] ENDOSCOPY;  Service: Endoscopy;  Laterality: N/A;  . TUBAL LIGATION     1975   Family History  Problem Relation Age of Onset  . Diabetes Mother   . Cancer - Prostate Brother    Social History   Tobacco Use  . Smoking status: Never Smoker  . Smokeless tobacco: Never Used  Substance Use Topics  . Alcohol use: No  . Drug use: No    Current Facility-Administered Medications:  .  0.9 %  sodium chloride infusion, , Intravenous, Once, Carole Civil, MD .  ALPRAZolam Duanne Moron) tablet 0.5 mg, 0.5 mg, Oral, QID PRN, Phillips Grout, MD, 0.5 mg at 12/29/17 2119 .  Chlorhexidine Gluconate Cloth 2 % PADS 6 each, 6 each, Topical, Q0600, Derrill Kay A, MD .  feeding supplement (ENSURE ENLIVE) (ENSURE ENLIVE) liquid 237 mL, 237 mL, Oral, BID BM, Derrill Kay A, MD .  HYDROcodone-acetaminophen (NORCO/VICODIN) 5-325 MG per tablet 1-2 tablet, 1-2 tablet, Oral, Q6H PRN, Phillips Grout, MD .  levothyroxine (SYNTHROID, LEVOTHROID) tablet 100 mcg, 100 mcg, Oral, QAC breakfast, Derrill Kay A, MD .  methocarbamol (ROBAXIN) tablet 500 mg, 500 mg, Oral, Q6H PRN **OR** methocarbamol (ROBAXIN) 500 mg in dextrose 5 % 50 mL IVPB, 500 mg, Intravenous, Q6H PRN, Phillips Grout, MD .  morphine 2 MG/ML injection  0.5 mg, 0.5 mg, Intravenous, Q2H PRN, Phillips Grout, MD .  mupirocin ointment (BACTROBAN) 2 % 1 application, 1 application, Nasal, BID, Carole Civil, MD, 1 application at 56/43/32 [phone] .  pantoprazole (PROTONIX) EC tablet 40 mg, 40 mg, Oral, Daily, Derrill Kay A, MD .  pneumococcal 23 valent vaccine (PNU-IMMUNE) injection 0.5 mL, 0.5 mL, Intramuscular, Tomorrow-1000, Derrill Kay A, MD .  sertraline (ZOLOFT) tablet 100 mg, 100 mg, Oral, Daily, Derrill Kay A, MD, 100 mg at 12/29/17 2155 .  sucralfate (CARAFATE) tablet 1 g, 1 g, Oral, TID Dennison Bulla, MD, 1 g at  12/29/17 2120  Allergies  Allergen Reactions  . Augmentin [Amoxicillin-Pot Clavulanate] Other (See Comments)    Side effects include loss of blood  . Famotidine Other (See Comments)    Fever   . Klonopin [Clonazepam] Other (See Comments)    REACTION: double vision  . Reglan [Metoclopramide]     Causes confusion, anxiety  . Nitrofurantoin Rash  . Sulfamethoxazole-Trimethoprim Rash    BP 116/73 (BP Location: Right Arm)   Pulse 75   Temp 98.2 F (36.8 C)   Resp 18   Ht 5\' 5"  (1.651 m)   Wt (!) 953 lb 4.3 oz (432.4 kg)   SpO2 100%   BMI 158.63 kg/m   Physical Exam  Constitutional: She is oriented to person, place, and time. She appears well-developed and well-nourished. No distress.  HENT:  Head: Normocephalic and atraumatic.  Right Ear: External ear normal.  Left Ear: External ear normal.  Nose: Nose normal.  Mouth/Throat: Oropharynx is clear and moist.  Eyes: Pupils are equal, round, and reactive to light. Conjunctivae and EOM are normal. Right eye exhibits no discharge. Left eye exhibits no discharge. No scleral icterus.  Neck: Normal range of motion. Neck supple. No JVD present. No tracheal deviation present. No thyromegaly present.  Cardiovascular: Normal rate, regular rhythm and intact distal pulses. PMI is not displaced.  Pulmonary/Chest: Effort normal. No respiratory distress. She exhibits no tenderness.  Musculoskeletal:       Legs: Left upper extremity right upper extremity Normal range of motion all joints reduced and stable normal strength and muscle tone no tremors no atrophy normal alignment normal color capillary refill pulses and sensation  Lymphadenopathy:    She has no cervical adenopathy.       Right cervical: No superficial cervical adenopathy present.      Left cervical: No superficial cervical adenopathy present.       Right: No supraclavicular adenopathy present.       Left: No supraclavicular adenopathy present.  Neurological: She is alert and  oriented to person, place, and time. She has normal strength and normal reflexes. She displays no atrophy and no tremor. No cranial nerve deficit or sensory deficit. She exhibits normal muscle tone. Coordination normal.  Skin: Skin is warm, dry and intact. Capillary refill takes less than 2 seconds. No rash noted. Rash is not nodular and not pustular. She is not diaphoretic. No cyanosis. Nails show no clubbing.  Psychiatric: She has a normal mood and affect. Her speech is normal and behavior is normal. Judgment and thought content normal. Cognition and memory are normal.   CBC Latest Ref Rng & Units 12/30/2017 12/29/2017 11/11/2017  WBC 4.0 - 10.5 K/uL 3.9(L) 4.8 4.5  Hemoglobin 12.0 - 15.0 g/dL 11.4(L) 11.8(L) 12.3  Hematocrit 36.0 - 46.0 % 36.3 38.7 37.8  Platelets 150 - 400 K/uL 376 379 343   BMP Latest Ref  Rng & Units 12/30/2017 12/29/2017 11/11/2017  Glucose 65 - 99 mg/dL 78 89 78  BUN 6 - 20 mg/dL 17 20 21   Creatinine 0.44 - 1.00 mg/dL 1.04(H) 1.16(H) 1.03(H)  BUN/Creat Ratio 12 - 28 - - 20  Sodium 135 - 145 mmol/L 141 143 143  Potassium 3.5 - 5.1 mmol/L 3.6 3.8 3.5  Chloride 101 - 111 mmol/L 117(H) 118(H) 117(HH)  CO2 22 - 32 mmol/L 17(L) 15(L) 14(L)  Calcium 8.9 - 10.3 mg/dL 7.6(L) 7.6(L) 7.4(L)    Imaging initial films AP of the right femur and pelvis showed a subcapital fracture  Additional films were ordered to delineate the type of fracture.  AP pelvis AP lateral right hip show a subcapital fracture age-indeterminate there appears to be bone loss in the superolateral quadrant  Diagnosis right femoral neck fracture  The procedure has been fully reviewed with the patient; The risks and benefits of surgery have been discussed and explained and understood. Alternative treatment has also been reviewed, questions were encouraged and answered. The postoperative plan is also been reviewed.  I discussed the situation with her son and her together and they agree to proceed with a right  total hip based on the patient's activity level nature of the fracture longevity of the fracture.  Right total hip arthroplasty

## 2017-12-30 NOTE — Interval H&P Note (Signed)
History and Physical Interval Note:  12/30/2017 9:18 AM  Brittany Hensley  has presented today for surgery, with the diagnosis of right femoral neck fracture  The various methods of treatment have been discussed with the patient and family. After consideration of risks, benefits and other options for treatment, the patient has consented to  Procedure(s): TOTAL HIP ARTHROPLASTY (Right) as a surgical intervention .  The patient's history has been reviewed, patient examined, no change in status, stable for surgery.  I have reviewed the patient's chart and labs.  Questions were answered to the patient's satisfaction.     [name]

## 2017-12-30 NOTE — Progress Notes (Signed)
Patient seen briefly this a.m. prior to right hip repair.  She has some pain, Dr. Aline Brochure has seen and is planning for right hip arthroplasty today.  Will likely need some form of rehabilitation afterwards.  We will continue to follow.  Domingo Mend, MD [facility] Pager: [phone]

## 2017-12-30 NOTE — Transfer of Care (Signed)
Immediate Anesthesia Transfer of Care Note  Patient: Brittany Hensley  Procedure(s) Performed: RIGHT TOTAL HIP ARTHROPLASTY (Right Hip)  Patient Location: PACU  Anesthesia Type:General  Level of Consciousness: awake and patient cooperative  Airway & Oxygen Therapy: Patient Spontanous Breathing and Patient connected to face mask oxygen  Post-op Assessment: Report given to RN, Post -op Vital signs reviewed and stable and Patient moving all extremities  Post vital signs: Reviewed and stable  Last Vitals:  Vitals Value Taken Time  BP    Temp    Pulse 83 12/30/2017 12:26 PM  Resp    SpO2 99 % 12/30/2017 12:26 PM  Vitals shown include unvalidated device data.  Last Pain:  Vitals:   12/30/17 0817  TempSrc: Oral  PainSc: 0-No pain      Patients Stated Pain Goal: 3 (46/56/81 2751)  Complications: No apparent anesthesia complications

## 2017-12-30 NOTE — Op Note (Signed)
12/30/2017  12:35 PM  PATIENT:  Brittany Hensley  67 y.o. female  PRE-OPERATIVE DIAGNOSIS:  right femoral neck fracture  POST-OPERATIVE DIAGNOSIS:  right femoral neck fracture  Operative findings: Impacted fracture right femoral neck, no degenerative changes of the head or neck, there was a significant inflammatory reaction presumably from attempted an ongoing healing of the fracture  PROCEDURE:  Procedure(s): RIGHT TOTAL HIP ARTHROPLASTY (Right) -27130  Implants: Depew Pinnacle cup with 2 screws size 48 and 6 press-fit Tri-Lock stem high offset with 32 x 1.0 head and neck length  Direct lateral approach    The surgery was done as follows The patient was identified in the preop area with 2 approved identification methods and the chart was reviewed the surgical site was confirmed in his right hip and this was marked.  She was taken to the operating room and given a gram of vancomycin secondary to allergies and MRSA positive status  She was intubated and general anesthesia was administered successfully. She was placed in lateral decubitus position with the right side up for direct lateral approach to the hip Axillary roll was placed.  The leg was prepped and draped sterilely and then timeout was taken to confirm the implant availability x-rays were up and site was confirmed.  Direct lateral approach to the hip was performed with direct lateral incision over the greater trochanter this was taken distally and proximally and then taken down to the deep fascia.  The fascia was split in line with the skin incision curving gently posteriorly.  Greater trochanteric bursa was excised.  The abductors were split two thirds anterior one third posterior and were taken off the greater trochanter exposing the Kapseal.  The capsule was entered and immediate hematoma was evacuated there was a significant inflammatory reaction which caused a lot of capsular bleeding which was controlled with  electrocautery and Surgicel  The femoral neck fracture was encountered it was impacted it was disimpacted and removed and the head measured 44 mm.  Provisional neck cut was performed and a capsulectomy was continued posteriorly exposing the lesser trochanter.  The neck cut was further defined leaving approximately 5 to 7 mm of neck length above the lesser trochanter.  The proximal femur was prepared using a box osteotome starter hole reamer canal finder and serial broaching up to a size 6.  We then exposed the acetabulum by placing 2 Steinmann pins in the pelvis to hold the abductors away from the acetabulum.  We examined the acetabulum we did not see any arthritis.  The transverse acetabular ligament was intact in the anterior and posterior columns were used to define the bony anatomy after labral excision  We started with a 44 reamer and reamed up to a 48 mm size.  We then placed the final reamings behind the cup and then placed a 48 mm Pinnacle cup and placed 2 screws.  We then placed a 32 x 48 polyethylene liner and then performed trial reduction with a regular offset and a high offset +1 x 32 femoral head.  The best stability was obtained with the high offset stem +1 neck length and 32 head.  We tested the leg lengths shuck test external rotation extension test flexion internal rotation test and sleep test.  The trial head and stem were removed, 2 #5's Ethibond sutures were placed through drill holes.  And the size 6 Tri-Lock stem was placed with a 32 x +1 femoral head and a reduction was performed.  Trial reduction  was repeated hip was stable  The sutures through the greater trochanter were used to repair the abductor mechanism anatomically.  A #1 Braylon suture was used to oversew the abductor mechanism in running fashion.  Prior to abductor mechanism repair exparel was injected into the hip capsule and deep soft tissues as well as the skin.  The hip was then abducted and the fascia was  closed in running fashion with #1 Braylon.  Subtenons tissue was closed with 0 Monocryl and skin was reapproximated with staples.  The Marcaine was injected at this time   assisted by Corrie Dandy  SURGEON:  Surgeon(s) and Role:    * Carole Civil, MD - Primary  Anesthesia General endotracheal intubation  Specimens head was sent for pathologic examination because of the history of atraumatic fracture  Estimated blood loss 300 cc  No blood administered  Drains none  Exparel 20 cc diluted with 40 cc of saline and 20 cc of half percent Marcaine was injected  Counts were correct   DICTATION: .Dragon Dictation  PLAN OF CARE: Admit to inpatient   PATIENT DISPOSITION:  PACU - hemodynamically stable.   Delay start of Pharmacological VTE agent (>24hrs) due to surgical blood loss or risk of bleeding: not applicable

## 2017-12-30 NOTE — Anesthesia Procedure Notes (Addendum)
Procedure Name: Intubation Performed by: Lenice Llamas, MD Pre-anesthesia Checklist: Patient identified, Patient being monitored, Timeout performed, Emergency Drugs available and Suction available Patient Re-evaluated:Patient Re-evaluated prior to induction Oxygen Delivery Method: Circle System Utilized Preoxygenation: Pre-oxygenation with 100% oxygen Induction Type: IV induction Ventilation: Mask ventilation without difficulty Laryngoscope Size: Mac and 3 Grade View: Grade I Tube type: Oral Tube size: 6.0 mm Number of attempts: 1 Airway Equipment and Method: Stylet Placement Confirmation: ETT inserted through vocal cords under direct vision,  positive ETCO2 and breath sounds checked- equal and bilateral Secured at: 20 cm Tube secured with: Tape Dental Injury: Teeth and Oropharynx as per pre-operative assessment

## 2017-12-30 NOTE — Brief Op Note (Signed)
12/30/2017  12:35 PM  PATIENT:  Brittany Hensley  67 y.o. female  PRE-OPERATIVE DIAGNOSIS:  right femoral neck fracture  POST-OPERATIVE DIAGNOSIS:  right femoral neck fracture  Operative findings: Impacted fracture right femoral neck, no degenerative changes of the head or neck, there was a significant inflammatory reaction presumably from attempted an ongoing healing of the fracture  PROCEDURE:  Procedure(s): RIGHT TOTAL HIP ARTHROPLASTY (Right) -27130  Implants: Depew Pinnacle cup with 2 screws size 48 and 6 press-fit Tri-Lock stem high offset with 32 x 1.0 head and neck length  Direct lateral approach    assisted by Corrie Dandy  SURGEON:  Surgeon(s) and Role:    Carole Civil, MD - Primary  Anesthesia General endotracheal intubation  Specimens head was sent for pathologic examination because of the history of atraumatic fracture  Estimated blood loss 300 cc  No blood administered  Drains none  Exparel 20 cc diluted with 40 cc of saline and 20 cc of half percent Marcaine was injected  Counts were correct   DICTATION: .Dragon Dictation  PLAN OF CARE: Admit to inpatient   PATIENT DISPOSITION:  PACU - hemodynamically stable.   Delay start of Pharmacological VTE agent (>24hrs) due to surgical blood loss or risk of bleeding: not applicable

## 2017-12-30 NOTE — Consult Note (Signed)
Consultation-ORTHOPAEDICS  Consultation requested by medical service Ruby Cola  Reason for consultation fractured right hip  Chief complaint pain right hip  We have a 67 year old female, history of anemia and ulcers, history of gastrectomy active does her own cooking shopping driving presents with a 13-month history of pain in her right hip.  The patient says she was moving some heavy furniture and started having left leg pain over time the left leg pain resolved but the right hip became more painful.  She sought medical attention and was sent for physical therapy.  On May 29 after several week history of increasing pain she had more severe pain in physical therapy and was sent to the primary care doctor's office and eventually for x-ray of the right femur.  That x-ray showed a subcapital fracture of the right hip age-indeterminate.  She was admitted to the hospital for evaluation and treatment of right femoral neck fracture.  She complains of pain in the right hip groin and thigh which is sharp severe nonradiating exacerbated by weightbearing and internal rotation.  She lives at home alone but her son lives next door  Review of Systems  Constitutional: Negative for diaphoresis, fever, malaise/fatigue and weight loss.  HENT: Negative for hearing loss, nosebleeds, sinus pain and sore throat.   Eyes: Negative for pain and discharge.  Respiratory: Negative for cough, hemoptysis, shortness of breath and stridor.   Cardiovascular: Negative for chest pain.  Gastrointestinal: Negative for heartburn.  Genitourinary: Negative for dysuria.  Musculoskeletal: Positive for joint pain. Negative for myalgias.  Skin: Negative for rash.  Neurological: Negative for sensory change, focal weakness and seizures.  Endo/Heme/Allergies: Negative for environmental allergies and polydipsia. Does not bruise/bleed easily.  Psychiatric/Behavioral: Negative for depression, hallucinations and substance  abuse. The patient is not nervous/anxious.    Past Medical History:  Diagnosis Date  . Anemia    iron   . Anxiety   . Diverticulitis    Hx: of  . GERD (gastroesophageal reflux disease)    at times  . History of kidney stones    10 years ago  . Hypothyroidism   . Low iron   . Pneumonia   . Rosacea    Hx: of    Past Surgical History:  Procedure Laterality Date  . APPENDECTOMY    . BALLOON DILATION  06/16/2011   Procedure: BALLOON DILATION;  Surgeon: Jeryl Columbia, MD;  Location: [facility] ENDOSCOPY;  Service: Endoscopy;  Laterality: N/A;  . BALLOON DILATION N/A 10/03/2013   Procedure: BALLOON DILATION;  Surgeon: Jeryl Columbia, MD;  Location: [facility] ENDOSCOPY;  Service: Endoscopy;  Laterality: N/A;  . BALLOON DILATION N/A 08/10/2014   Procedure: BALLOON DILATION;  Surgeon: Jeryl Columbia, MD;  Location: [facility] ENDOSCOPY;  Service: Endoscopy;  Laterality: N/A;  from 12 - 15 cm dilation completed  . BALLOON DILATION N/A 02/15/2015   Procedure: BALLOON DILATION;  Surgeon: Clarene Essex, MD;  Location: [facility] ENDOSCOPY;  Service: Endoscopy;  Laterality: N/A;  . BALLOON DILATION N/A 11/12/2015   Procedure: BALLOON DILATION;  Surgeon: Clarene Essex, MD;  Location: [facility] ENDOSCOPY;  Service: Endoscopy;  Laterality: N/A;  . BALLOON DILATION N/A 08/10/2016   Procedure: BALLOON DILATION;  Surgeon: Clarene Essex, MD;  Location: [facility] ENDOSCOPY;  Service: Endoscopy;  Laterality: N/A;  . BALLOON DILATION N/A 08/07/2016   Procedure: BALLOON DILATION;  Surgeon: Clarene Essex, MD;  Location: [facility] ENDOSCOPY;  Service: Endoscopy;  Laterality: N/A;  . BALLOON DILATION N/A 05/20/2017   Procedure: BALLOON DILATION;  Surgeon: Clarene Essex, MD;  Location: [facility] ENDOSCOPY;  Service: Endoscopy;  Laterality: N/A;  . BALLOON DILATION N/A 12/02/2017   Procedure: BALLOON DILATION;  Surgeon: Clarene Essex, MD;  Location: [place];  Service: Endoscopy;  Laterality: N/A;  . CHOLECYSTECTOMY OPEN  1979  . COLONOSCOPY     Hx: of  . ESOPHAGOGASTRODUODENOSCOPY   06/16/2011   Procedure: ESOPHAGOGASTRODUODENOSCOPY (EGD);  Surgeon: Jeryl Columbia, MD;  Location: [facility] ENDOSCOPY;  Service: Endoscopy;  Laterality: N/A;  . ESOPHAGOGASTRODUODENOSCOPY N/A 10/03/2013   Procedure: ESOPHAGOGASTRODUODENOSCOPY (EGD);  Surgeon: Jeryl Columbia, MD;  Location: [facility] ENDOSCOPY;  Service: Endoscopy;  Laterality: N/A;  . ESOPHAGOGASTRODUODENOSCOPY N/A 08/10/2014   Procedure: ESOPHAGOGASTRODUODENOSCOPY (EGD);  Surgeon: Jeryl Columbia, MD;  Location: [facility] ENDOSCOPY;  Service: Endoscopy;  Laterality: N/A;  . ESOPHAGOGASTRODUODENOSCOPY N/A 08/10/2016   Procedure: ESOPHAGOGASTRODUODENOSCOPY (EGD);  Surgeon: Clarene Essex, MD;  Location: [facility] ENDOSCOPY;  Service: Endoscopy;  Laterality: N/A;  . ESOPHAGOGASTRODUODENOSCOPY (EGD) WITH PROPOFOL N/A 02/15/2015   Procedure: ESOPHAGOGASTRODUODENOSCOPY (EGD) WITH PROPOFOL;  Surgeon: Clarene Essex, MD;  Location: [facility] ENDOSCOPY;  Service: Endoscopy;  Laterality: N/A;  . ESOPHAGOGASTRODUODENOSCOPY (EGD) WITH PROPOFOL N/A 11/12/2015   Procedure: ESOPHAGOGASTRODUODENOSCOPY (EGD) WITH PROPOFOL;  Surgeon: Clarene Essex, MD;  Location: [facility] ENDOSCOPY;  Service: Endoscopy;  Laterality: N/A;  . ESOPHAGOGASTRODUODENOSCOPY (EGD) WITH PROPOFOL N/A 08/07/2016   Procedure: ESOPHAGOGASTRODUODENOSCOPY (EGD) WITH PROPOFOL;  Surgeon: Clarene Essex, MD;  Location: [facility] ENDOSCOPY;  Service: Endoscopy;  Laterality: N/A;  . ESOPHAGOGASTRODUODENOSCOPY (EGD) WITH PROPOFOL N/A 05/20/2017   Procedure: ESOPHAGOGASTRODUODENOSCOPY (EGD) WITH PROPOFOL;  Surgeon: Clarene Essex, MD;  Location: [facility] ENDOSCOPY;  Service: Endoscopy;  Laterality: N/A;  . ESOPHAGOGASTRODUODENOSCOPY (EGD) WITH PROPOFOL N/A 12/02/2017   Procedure: ESOPHAGOGASTRODUODENOSCOPY (EGD) WITH PROPOFOL;  Surgeon: Clarene Essex, MD;  Location: [place];  Service: Endoscopy;  Laterality: N/A;  have c arm available  . FRACTURE SURGERY     Hx: of left wrist  . GASTRECTOMY  [phone]  . GASTRECTOMY N/A    X3  . LITHOTRIPSY     Hx; of for  kidney stones  . SAVORY DILATION N/A 08/10/2014   Procedure: SAVORY DILATION;  Surgeon: Jeryl Columbia, MD;  Location: [facility] ENDOSCOPY;  Service: Endoscopy;  Laterality: N/A;  . TUBAL LIGATION     1975   Family History  Problem Relation Age of Onset  . Diabetes Mother   . Cancer - Prostate Brother    Social History   Tobacco Use  . Smoking status: Never Smoker  . Smokeless tobacco: Never Used  Substance Use Topics  . Alcohol use: No  . Drug use: No    Current Facility-Administered Medications:  .  0.9 %  sodium chloride infusion, , Intravenous, Once, Carole Civil, MD .  ALPRAZolam Duanne Moron) tablet 0.5 mg, 0.5 mg, Oral, QID PRN, Phillips Grout, MD, 0.5 mg at 12/29/17 2119 .  Chlorhexidine Gluconate Cloth 2 % PADS 6 each, 6 each, Topical, Q0600, Derrill Kay A, MD .  feeding supplement (ENSURE ENLIVE) (ENSURE ENLIVE) liquid 237 mL, 237 mL, Oral, BID BM, Derrill Kay A, MD .  HYDROcodone-acetaminophen (NORCO/VICODIN) 5-325 MG per tablet 1-2 tablet, 1-2 tablet, Oral, Q6H PRN, Phillips Grout, MD .  levothyroxine (SYNTHROID, LEVOTHROID) tablet 100 mcg, 100 mcg, Oral, QAC breakfast, Derrill Kay A, MD .  methocarbamol (ROBAXIN) tablet 500 mg, 500 mg, Oral, Q6H PRN **OR** methocarbamol (ROBAXIN) 500 mg in dextrose 5 % 50 mL IVPB, 500 mg, Intravenous, Q6H PRN, Phillips Grout, MD .  morphine 2 MG/ML injection  0.5 mg, 0.5 mg, Intravenous, Q2H PRN, Phillips Grout, MD .  mupirocin ointment (BACTROBAN) 2 % 1 application, 1 application, Nasal, BID, Carole Civil, MD, 1 application at 66/06/30 [phone] .  pantoprazole (PROTONIX) EC tablet 40 mg, 40 mg, Oral, Daily, Derrill Kay A, MD .  pneumococcal 23 valent vaccine (PNU-IMMUNE) injection 0.5 mL, 0.5 mL, Intramuscular, Tomorrow-1000, Derrill Kay A, MD .  sertraline (ZOLOFT) tablet 100 mg, 100 mg, Oral, Daily, Derrill Kay A, MD, 100 mg at 12/29/17 2155 .  sucralfate (CARAFATE) tablet 1 g, 1 g, Oral, TID Dennison Bulla, MD, 1 g at  12/29/17 2120  Allergies  Allergen Reactions  . Augmentin [Amoxicillin-Pot Clavulanate] Other (See Comments)    Side effects include loss of blood  . Famotidine Other (See Comments)    Fever   . Klonopin [Clonazepam] Other (See Comments)    REACTION: double vision  . Reglan [Metoclopramide]     Causes confusion, anxiety  . Nitrofurantoin Rash  . Sulfamethoxazole-Trimethoprim Rash    BP 116/73 (BP Location: Right Arm)   Pulse 75   Temp 98.2 F (36.8 C)   Resp 18   Ht 5\' 5"  (1.651 m)   Wt (!) 953 lb 4.3 oz (432.4 kg)   SpO2 100%   BMI 158.63 kg/m   Physical Exam  Constitutional: She is oriented to person, place, and time. She appears well-developed and well-nourished. No distress.  HENT:  Head: Normocephalic and atraumatic.  Right Ear: External ear normal.  Left Ear: External ear normal.  Nose: Nose normal.  Mouth/Throat: Oropharynx is clear and moist.  Eyes: Pupils are equal, round, and reactive to light. Conjunctivae and EOM are normal. Right eye exhibits no discharge. Left eye exhibits no discharge. No scleral icterus.  Neck: Normal range of motion. Neck supple. No JVD present. No tracheal deviation present. No thyromegaly present.  Cardiovascular: Normal rate, regular rhythm and intact distal pulses. PMI is not displaced.  Pulmonary/Chest: Effort normal. No respiratory distress. She exhibits no tenderness.  Musculoskeletal:       Legs: Left upper extremity right upper extremity Normal range of motion all joints reduced and stable normal strength and muscle tone no tremors no atrophy normal alignment normal color capillary refill pulses and sensation  Lymphadenopathy:    She has no cervical adenopathy.       Right cervical: No superficial cervical adenopathy present.      Left cervical: No superficial cervical adenopathy present.       Right: No supraclavicular adenopathy present.       Left: No supraclavicular adenopathy present.  Neurological: She is alert and  oriented to person, place, and time. She has normal strength and normal reflexes. She displays no atrophy and no tremor. No cranial nerve deficit or sensory deficit. She exhibits normal muscle tone. Coordination normal.  Skin: Skin is warm, dry and intact. Capillary refill takes less than 2 seconds. No rash noted. Rash is not nodular and not pustular. She is not diaphoretic. No cyanosis. Nails show no clubbing.  Psychiatric: She has a normal mood and affect. Her speech is normal and behavior is normal. Judgment and thought content normal. Cognition and memory are normal.   CBC Latest Ref Rng & Units 12/30/2017 12/29/2017 11/11/2017  WBC 4.0 - 10.5 K/uL 3.9(L) 4.8 4.5  Hemoglobin 12.0 - 15.0 g/dL 11.4(L) 11.8(L) 12.3  Hematocrit 36.0 - 46.0 % 36.3 38.7 37.8  Platelets 150 - 400 K/uL 376 379 343   BMP Latest Ref  Rng & Units 12/30/2017 12/29/2017 11/11/2017  Glucose 65 - 99 mg/dL 78 89 78  BUN 6 - 20 mg/dL 17 20 21   Creatinine 0.44 - 1.00 mg/dL 1.04(H) 1.16(H) 1.03(H)  BUN/Creat Ratio 12 - 28 - - 20  Sodium 135 - 145 mmol/L 141 143 143  Potassium 3.5 - 5.1 mmol/L 3.6 3.8 3.5  Chloride 101 - 111 mmol/L 117(H) 118(H) 117(HH)  CO2 22 - 32 mmol/L 17(L) 15(L) 14(L)  Calcium 8.9 - 10.3 mg/dL 7.6(L) 7.6(L) 7.4(L)    Imaging initial films AP of the right femur and pelvis showed a subcapital fracture  Additional films were ordered to delineate the type of fracture.  AP pelvis AP lateral right hip show a subcapital fracture age-indeterminate there appears to be bone loss in the superolateral quadrant  Diagnosis right femoral neck fracture  The procedure has been fully reviewed with the patient; The risks and benefits of surgery have been discussed and explained and understood. Alternative treatment has also been reviewed, questions were encouraged and answered. The postoperative plan is also been reviewed.  I discussed the situation with her son and her together and they agree to proceed with a right  total hip based on the patient's activity level nature of the fracture longevity of the fracture.  Right total hip arthroplasty

## 2017-12-31 ENCOUNTER — Encounter: Payer: PPO | Admitting: Physical Therapy

## 2017-12-31 ENCOUNTER — Encounter (HOSPITAL_COMMUNITY): Payer: Self-pay | Admitting: Orthopedic Surgery

## 2017-12-31 LAB — BASIC METABOLIC PANEL
BUN: 16 mg/dL (ref 6–20)
CHLORIDE: 119 mmol/L — AB (ref 101–111)
CO2: 21 mmol/L — ABNORMAL LOW (ref 22–32)
Calcium: 7.8 mg/dL — ABNORMAL LOW (ref 8.9–10.3)
Creatinine, Ser: 0.92 mg/dL (ref 0.44–1.00)
GFR calc Af Amer: >60 mL/min (ref >60)
GFR calc non Af Amer: >60 mL/min (ref >60)
Glucose, Bld: 116 mg/dL — ABNORMAL HIGH (ref 65–99)
POTASSIUM: 4.5 mmol/L (ref 3.5–5.1)
SODIUM: 142 mmol/L (ref 135–145)

## 2017-12-31 LAB — CBC
HCT: 33.5 % — ABNORMAL LOW (ref 36.0–46.0)
Hemoglobin: 10.3 g/dL — ABNORMAL LOW (ref 12.0–15.0)
MCH: 32.7 pg (ref 26.0–34.0)
MCHC: 30.7 g/dL (ref 30.0–36.0)
MCV: 106.3 fL — ABNORMAL HIGH (ref 78.0–100.0)
PLATELETS: 350 10*3/uL (ref 150–400)
RBC: 3.15 MIL/uL — AB (ref 3.87–5.11)
RDW: 15.5 % (ref 11.5–15.5)
WBC: 5.5 10*3/uL (ref 4.0–10.5)

## 2017-12-31 LAB — PROTIME-INR
INR: 1.15
PROTHROMBIN TIME: 14.6 s (ref 11.4–15.2)

## 2017-12-31 MED ORDER — JUVEN PO PACK
1.0000 | PACK | Freq: Two times a day (BID) | ORAL | Status: DC
  Administered 2017-12-31 – 2018-01-03 (×7): 1 via ORAL
  Filled 2017-12-31 (×7): qty 1

## 2017-12-31 MED ORDER — ADULT MULTIVITAMIN W/MINERALS CH
1.0000 | ORAL_TABLET | Freq: Every day | ORAL | Status: DC
  Administered 2018-01-01 – 2018-01-03 (×3): 1 via ORAL
  Filled 2017-12-31 (×3): qty 1

## 2017-12-31 MED ORDER — WARFARIN SODIUM 2.5 MG PO TABS
2.5000 mg | ORAL_TABLET | Freq: Every day | ORAL | Status: AC
Start: 2017-12-31 — End: 2017-12-31
  Administered 2017-12-31: 2.5 mg via ORAL
  Filled 2017-12-31: qty 1

## 2017-12-31 MED ORDER — SODIUM CHLORIDE 0.45 % IV BOLUS
500.0000 mL | Freq: Once | INTRAVENOUS | Status: AC
  Administered 2017-12-31: 500 mL via INTRAVENOUS

## 2017-12-31 NOTE — Progress Notes (Addendum)
Patient ID: Brittany Hensley, female   DOB: 12-05-1950, 67 y.o.   MRN: [id number] Postop day 1 right femoral neck fracture treated with right total hip  BP 94/60 (BP Location: Right Arm)   Pulse 78   Temp 98.2 F (36.8 C)   Resp 17   Ht 5\' 5"  (1.651 m)   Wt 95 lb 10.9 oz (43.4 kg)   SpO2 98%   BMI 15.92 kg/m   Complaints throbbing pain right hip but patient states pain not as bad as when she was walking on the broken hip  CBC Latest Ref Rng & Units 12/31/2017 12/30/2017 12/29/2017  WBC 4.0 - 10.5 K/uL 5.5 3.9(L) 4.8  Hemoglobin 12.0 - 15.0 g/dL 10.3(L) 11.4(L) 11.8(L)  Hematocrit 36.0 - 46.0 % 33.5(L) 36.3 38.7  Platelets 150 - 400 K/uL 350 376 379   BMP Latest Ref Rng & Units 12/31/2017 12/30/2017 12/29/2017  Glucose 65 - 99 mg/dL 116(H) 78 89  BUN 6 - 20 mg/dL 16 17 20   Creatinine 0.44 - 1.00 mg/dL 0.92 1.04(H) 1.16(H)  BUN/Creat Ratio 12 - 28 - - -  Sodium 135 - 145 mmol/L 142 141 143  Potassium 3.5 - 5.1 mmol/L 4.5 3.6 3.8  Chloride 101 - 111 mmol/L 119(H) 117(H) 118(H)  CO2 22 - 32 mmol/L 21(L) 17(L) 15(L)  Calcium 8.9 - 10.3 mg/dL 7.8(L) 7.6(L) 7.6(L)   First dose of warfarin was 2.5 first INR was 1.15  Continue 2.5 mg warfarin daily  The patient will be on low-dose non-therapeutic Coumadin secondary to her history of chronic anemia chronic ulcers.   The patient's neurovascular exam is intact.  She has no excessive bleeding from the dressing.  Today we want her to get up we want her to stand up possibly walk.  Want to make sure pain is under control.  Foley catheter out fluids turned down to [facility]

## 2017-12-31 NOTE — Care Management Important Message (Signed)
Important Message  Patient Details  Name: Brittany Hensley MRN: [id number] Date of Birth: [birth year]   Medicare Important Message Given:  Yes    Shelda Altes 12/31/2017, 12:32 PM

## 2017-12-31 NOTE — Progress Notes (Signed)
Order in to remove foley POD 1. Patient does not want foley removed at this time d/t anxiety about pain when getting up to the bathroom. Patient educated on infection risk of foley and importance of activity after surgery. PT is supposed to work with patient today. Patient wants to keep foley in until she can experience getting up OOB with PT. This RN will pass on in shift report.

## 2017-12-31 NOTE — Anesthesia Postprocedure Evaluation (Signed)
Anesthesia Post Note  Patient: Brittany Hensley  Procedure(s) Performed: RIGHT TOTAL HIP ARTHROPLASTY (Right Hip)  Patient location during evaluation: PACU Anesthesia Type: General Level of consciousness: awake Pain management: pain level controlled Vital Signs Assessment: post-procedure vital signs reviewed and stable Respiratory status: spontaneous breathing, nonlabored ventilation and respiratory function stable Cardiovascular status: blood pressure returned to baseline Postop Assessment: no apparent nausea or vomiting Anesthetic complications: no     Last Vitals:  Vitals:   12/31/17 0854 12/31/17 0901  BP: (!) 63/46 (!) 78/53  Pulse:    Resp:    Temp:    SpO2:      Last Pain:  Vitals:   12/31/17 0607  TempSrc:   PainSc: 5                  [name]

## 2017-12-31 NOTE — Addendum Note (Signed)
Addendum  created 12/31/17 1114 by Charmaine Downs, CRNA   Sign clinical note

## 2017-12-31 NOTE — Progress Notes (Addendum)
Therapist Jeneen Rinks reported to me that patient's BP after transferring to the chair was 63/46. I told him to put the patient back in the bed and we will recheck another BP in a few minutes. Will continue to monitor.   Therapist Jeneen Rinks rechecked patient's BP when patient was returned to bed. Patient's BP is up to 78/53. Will continue to monitor. And will make MD aware.

## 2017-12-31 NOTE — Care Management Note (Signed)
Case Management Note  Patient Details  Name: SANDRIA MCENROE MRN: [id number] Date of Birth: 1950/12/29  Subjective/Objective:                    Action/Plan: Patient elects to return home with home health. She would like [facility]. Juliann Pulse of [facility] notified and will obtain orders when available. Patient will need home health/face to face ordered prior to DC.   Expected Discharge Date:       unk           Expected Discharge Plan:  [place]  In-House Referral:  Clinical Social Work  Discharge planning Services  CM Consult  Post Acute Care Choice:  Home Health Choice offered to:  Patient  DME Arranged:    DME Agency:     HH Arranged:  PT [place]:  [place]  Status of Service:  Completed, signed off  If discussed at [place] of Stay Meetings, dates discussed:    Additional Comments:  Armour Villanueva, Chauncey [place], RN 12/31/2017, 1:29 PM

## 2017-12-31 NOTE — Evaluation (Signed)
Physical Therapy Evaluation Patient Details Name: Brittany Hensley MRN: [id number] DOB: 01/18/1951 Today's Date: 12/31/2017   History of Present Illness  [name] is a 67 y.o. female s/p Right THA 12/30/17 with medical history significant of anxiety comes in with 6 weeks of worsening right leg pain for about 6 weeks now.  She has been walking on it and it has been painful.  She has been doing outpt PT and it has not improved.  Her PCP did xrays today which showed a right femur fracture and sent to ED.  Pt otherwise in normal state of health.  Dr Aline Brochure called and will plan for OR tomorrow for repair.    Clinical Impression  Patient instructed in HEP with written instructions provided and good return demonstrated.  Patient required assistance to move RLE when sitting up at bedside, limited RLE weightbearing due to increased pain during gait training, limited mostly due to c/o lightheadedness, BP checked and initial BP 63/46 and later increased to 78/53 after sitting for a few minutes with resolution of lightheadedness.  Patient tolerated sitting up in chair to eat breakfast after therapy.  Patient will benefit from continued physical therapy in hospital and recommended venue below to increase strength, balance, endurance for safe ADLs and gait.  Patient wants to go home and case manager/social worker informed to verify that patient's family can take care of her.    Follow Up Recommendations SNF;Supervision/Assistance - 24 hour    Equipment Recommendations  None recommended by PT    Recommendations for Other Services       Precautions / Restrictions Precautions Precautions: Fall Precaution Comments: s/p Right THA Restrictions Weight Bearing Restrictions: Yes RLE Weight Bearing: Weight bearing as tolerated      Mobility  Bed Mobility Overal bed mobility: Needs Assistance Bed Mobility: Supine to Sit     Supine to sit: Min assist     General bed mobility comments: slightly  labored slow movement with assist to move RLE  Transfers Overall transfer level: Needs assistance Equipment used: Rolling walker (2 wheeled) Transfers: Sit to/from [facility] Sit to Stand: Min guard Stand pivot transfers: Min guard          Ambulation/Gait Ambulation/Gait assistance: Min assist Ambulation Distance (Feet): 35 Feet Assistive device: Rolling walker (2 wheeled) Gait Pattern/deviations: Decreased step length - right;Decreased stance time - right;WFL(Within Functional Limits) Gait velocity: slow   General Gait Details: demonstrates slow labored cadence with limited weight bearing on RLE due to increased pain, required VC's to attempt heel to toe stepping with fair return and tolerated mostly partial weight bearing on RLE, limited secondary to c/o lightheadedness  Stairs            Wheelchair Mobility    Modified Rankin (Stroke Patients Only)       Balance Overall balance assessment: Needs assistance Sitting-balance support: Feet supported;No upper extremity supported Sitting balance-Leahy Scale: Good     Standing balance support: Bilateral upper extremity supported;During functional activity Standing balance-Leahy Scale: Fair                               Pertinent Vitals/Pain Pain Assessment: 0-10 Pain Score: 2  Pain Location: right hip Pain Descriptors / Indicators: Aching;Sore;Discomfort Pain Intervention(s): Limited activity within patient's tolerance;Monitored during session    [place] expects to be discharged to:: Private residence Living Arrangements: Alone Available Help at Discharge: Family(son and brother) Type of  Home: House Home Access: Stairs to enter Entrance Stairs-Rails: Left Entrance Stairs-Number of Steps: 3 Home Layout: One level Home Equipment: Walker - 2 wheels;Shower seat      Prior Function Level of Independence: Needs assistance   Gait / Transfers Assistance Needed:  Household ambulator with RW  ADL's / Homemaking Assistance Needed: her brother and son lives next to her and can assist if needed        Hand Dominance        Extremity/Trunk Assessment   Upper Extremity Assessment Upper Extremity Assessment: Overall WFL for tasks assessed    Lower Extremity Assessment Lower Extremity Assessment: Generalized weakness;RLE deficits/detail;LLE deficits/detail RLE Deficits / Details: grossly 3+/5 LLE Deficits / Details: grossly 4+/5    Cervical / Trunk Assessment Cervical / Trunk Assessment: Normal  Communication   Communication: No difficulties  Cognition Arousal/Alertness: Awake/alert Behavior During Therapy: WFL for tasks assessed/performed Overall Cognitive Status: Within Functional Limits for tasks assessed                                        General Comments      Exercises Total Joint Exercises Ankle Circles/Pumps: Supine;AROM;Both;5 reps;Strengthening Quad Sets: Supine;AROM;Strengthening;Right;5 reps Gluteal Sets: Supine;AROM;Strengthening;Both;5 reps Short Arc Quad: Supine;AROM;Strengthening;Right;5 reps Heel Slides: Supine;AROM;Strengthening;Right;5 reps   Assessment/Plan    PT Assessment Patient needs continued PT services  PT Problem List Decreased strength;Decreased activity tolerance;Decreased balance;Decreased mobility       PT Treatment Interventions Gait training;Stair training;Functional mobility training;Therapeutic activities;Therapeutic exercise;Patient/family education    PT Goals (Current goals can be found in the Care Plan section)  Acute Rehab PT Goals Patient Stated Goal: return directly home with family to assist and HHPT follow-up PT Goal Formulation: With patient Time For Goal Achievement: 01/14/18 Potential to Achieve Goals: Good    Frequency 7X/week   Barriers to discharge        Co-evaluation               AM-PAC PT "6 Clicks" Daily Activity  Outcome Measure  Difficulty turning over in bed (including adjusting bedclothes, sheets and blankets)?: A Little Difficulty moving from lying on back to sitting on the side of the bed? : A Little Difficulty sitting down on and standing up from a chair with arms (e.g., wheelchair, bedside commode, etc,.)?: A Little Help needed moving to and from a bed to chair (including a wheelchair)?: A Little Help needed walking in hospital room?: A Little Help needed climbing 3-5 steps with a railing? : A Lot 6 Click Score: 17    End of Session   Activity Tolerance: Patient tolerated treatment well;Patient limited by fatigue(Paient limited secondary to c/o lightheadedness) Patient left: in chair;with call bell/phone within reach Nurse Communication: Mobility status;Other (comment)(RN notified that patient left up in chair) PT Visit Diagnosis: Unsteadiness on feet (R26.81);Other abnormalities of gait and mobility (R26.89);Muscle weakness (generalized) (M62.81)    Time: 0277-4128 PT Time Calculation (min) (ACUTE ONLY): 35 min   Charges:   PT Evaluation $PT Eval Moderate Complexity: 1 Mod PT Treatments $Therapeutic Activity: 23-37 mins   PT G Codes:        2:20 PM, 01-18-18 Lonell Grandchild, MPT Physical Therapist with [facility] [phone] office [phone] mobile phone

## 2017-12-31 NOTE — Clinical Social Work Note (Signed)
Pt is a 67 year old female referred to CSW for SNF placement. Met with pt this AM. Per pt, she lives alone but her son lives right next door and she has multiple family members who are in and out of her home multiple times a day. Pt states she would like to return home at dc. Per pt, she has a walker and does not feel that she has any other DME needs.   PT indicated that pt is safe to return home with [facility] if that is what she wants. Pt is agreeable to [facility] for therapy. Provided pt with options for [facility] agencies. She requested care from [facility]. Updated RN CM, Sharley, who will follow up to arrange pt's [place]. Will clear CSW consult.

## 2017-12-31 NOTE — Plan of Care (Signed)
  Problem: Acute Rehab PT Goals(only PT should resolve) Goal: Pt Will Go Supine/Side To Sit Outcome: Progressing Flowsheets (Taken 12/31/2017 1421) Pt will go Supine/Side to Sit: with supervision Goal: Patient Will Transfer Sit To/From Stand Outcome: Progressing Flowsheets (Taken 12/31/2017 1421) Patient will transfer sit to/from stand: with supervision Goal: Pt Will Transfer Bed To Chair/Chair To Bed Outcome: Progressing Flowsheets (Taken 12/31/2017 1421) Pt will Transfer Bed to Chair/Chair to Bed: with supervision Goal: Pt Will Ambulate Outcome: Progressing Flowsheets (Taken 12/31/2017 1421) Pt will Ambulate: 75 feet;with min guard assist;with rolling walker  2:22 PM, 12/31/17 Lonell Grandchild, MPT Physical Therapist with [facility] 336 [phone] office [phone] mobile phone

## 2017-12-31 NOTE — Progress Notes (Signed)
Initial Nutrition Assessment  DOCUMENTATION CODES:   Underweight  INTERVENTION:   - Suggest screen for B12 deficiency due to: hx of partial stomach/intestine removal, signs of severe malnutrition, and reported unsteadiness/weakness from PT  - Monitor PO intake and weight.  - Continue Ensure Enlive po BID, each supplement provides 350 kcal and 20 grams of protein  - Will order Juven supplement BID which contains CaHMB, Arginine, Glutamine and collagen to promote wound healing/tissue granulation.  - Will order multivitamin daily.   NUTRITION DIAGNOSIS:   Chronic Severe Malnutrition related to altered GI function: hx of gastrectomy, poor appetite, suspected sarcopenia and malabsorption as evidenced by severe fat depletion, severe muscle depletion, Underweight BMI (<18.5) for 15+ years.  GOAL:   Patient will meet greater than or equal to 90% of their needs  MONITOR:   PO intake, Weight trends, Supplement acceptance, Skin  REASON FOR ASSESSMENT:   Malnutrition Screening Tool   ASSESSMENT:   67 y/o with Hx of anemia, diverticulitis, GERD, hypothyroidism, low iron, and gastrectomy.  Admitted for right hip arthroplasty.  MST score of 2.  Pt does own cooking/grocery shopping at home.  Says appetite varies due to Hx of gastrectomy x3 due to stomach ulcers: only has half her stomach remaining; part of intestine removed as well (pt doesn't know which part).  Has to get a "balloon" down her esophagus to open her stomach ~every 6 months, or else it closes up and she vomits any food she tries to eat.  Pt also regularly takes a stool softener to help with chronic constipation.    Pt takes a variety of supplements every morning: calcium, vit D, probiotics, flaxseed, lysine (?) for lips and mouth sores, tumeric, and a multivitamin.  Says they make her feel bad, but she needs them, so she takes them w/applesauce and grapefruit for breakfast.  Other two meals consist of [place] or fried chicken  and sometimes vegetables like green beans.  Doesn't like beef, "lives off" chicken and [place].  She snacks a lot between meals with popcorn and potato chips.  Was told by stomach doctor to not eat raw vegetables as she cannot digest them. Pt thinks she is protein deficient, so regularly takes premier protein supplement with dairy aid (for lactose intolerance) and ice cream.   Pt screened for MST of 2 for wt loss, but when asked said she has weighed the same for the last 15 years.  Documented wt hx confirms stable wt since Oct. 2018.  Nutrition focused physical exam showed signs of severe malnutrition, although with stable wt, RD intern suspects signs are related to sarcopenia and potential malabsorption due to hx of gastrectomy.   Currently pt is eating 50-75% of meals, which correlates with pt's small appetite.  Suggested intervention includes a screen for B12 deficiency, monitor PO intake and weight, continue ensure enlive, and order Juven and a multivitamin.  Labs reviewed: Glucose 116, CO2 21, Cl- 119 Recent Labs  Lab 12/29/17 1556 12/30/17 0453 12/31/17 0441  NA 143 141 142  K 3.8 3.6 4.5  CL 118* 117* 119*  CO2 15* 17* 21*  BUN 20 17 16   CREATININE 1.16* 1.04* 0.92  CALCIUM 7.6* 7.6* 7.8*  GLUCOSE 89 78 116*   Medications:  Colace, ensure enlive BID, levothyroxine, protonix, carafate, coumadin  NUTRITION - FOCUSED PHYSICAL EXAM:    Most Recent Value  Orbital Region  No depletion  Upper Arm Region  Moderate depletion  Thoracic and Lumbar Region  Severe depletion  Olds  Region  Moderate depletion  Clavicle Bone Region  Severe depletion  Clavicle and Acromion Bone Region  Severe depletion  Scapular Bone Region  Severe depletion  Dorsal Hand  Moderate depletion  Anterior Thigh Region  Severe depletion  Posterior Calf Region  Severe depletion  Edema (RD Assessment)  None  Skin  Reviewed  Nails  Reviewed     Diet Order:   Diet Order           Diet regular Room service  appropriate? Yes; Fluid consistency: Thin  Diet effective now         EDUCATION NEEDS:   No education needs have been identified at this time  Skin:  Skin Assessment: Skin Integrity Issues: Skin Integrity Issues:: Stage II, Incisions Stage II: Mid coccyx: pink erythema Incisions: right hip: surgical.  Drainage: scant, sanguineous  Last BM:  5/28 (last recorded)  Height:   Ht Readings from Last 1 Encounters:  12/29/17 5\' 5"  (1.651 m)   Weight:   Wt Readings from Last 1 Encounters:  12/30/17 95 lb 10.9 oz (43.4 kg)   Ideal Body Weight:  56.7 kg  BMI:  Body mass index is 15.92 kg/m.  Estimated Nutritional Needs:   Kcal:  1300-1520 kcal (30-35 kcal/kg BW) 30-35 kcal for wound healing Protein:  65-76 g (1.5-1.75 g/kg BW) 1.5-1.75 for wound healing/malnutrition/to reach 20% of kcal Fluid:  >/=1400 mL (>/=25 mL/kg IBW)

## 2018-01-01 DIAGNOSIS — L8990 Pressure ulcer of unspecified site, unspecified stage: Secondary | ICD-10-CM

## 2018-01-01 LAB — BASIC METABOLIC PANEL
ANION GAP: 3 — AB (ref 5–15)
BUN: 28 mg/dL — ABNORMAL HIGH (ref 6–20)
CHLORIDE: 115 mmol/L — AB (ref 101–111)
CO2: 20 mmol/L — AB (ref 22–32)
Calcium: 7.6 mg/dL — ABNORMAL LOW (ref 8.9–10.3)
Creatinine, Ser: 0.93 mg/dL (ref 0.44–1.00)
GFR calc non Af Amer: >60 mL/min (ref >60)
Glucose, Bld: 129 mg/dL — ABNORMAL HIGH (ref 65–99)
POTASSIUM: 3.9 mmol/L (ref 3.5–5.1)
Sodium: 138 mmol/L (ref 135–145)

## 2018-01-01 LAB — CBC
HEMATOCRIT: 27.3 % — AB (ref 36.0–46.0)
HEMOGLOBIN: 8.4 g/dL — AB (ref 12.0–15.0)
MCH: 32.7 pg (ref 26.0–34.0)
MCHC: 30.8 g/dL (ref 30.0–36.0)
MCV: 106.2 fL — ABNORMAL HIGH (ref 78.0–100.0)
Platelets: 309 10*3/uL (ref 150–400)
RBC: 2.57 MIL/uL — AB (ref 3.87–5.11)
RDW: 14.9 % (ref 11.5–15.5)
WBC: 6.3 10*3/uL (ref 4.0–10.5)

## 2018-01-01 LAB — HEMOGLOBIN AND HEMATOCRIT, BLOOD
HEMATOCRIT: 32.2 % — AB (ref 36.0–46.0)
HEMOGLOBIN: 10.1 g/dL — AB (ref 12.0–15.0)

## 2018-01-01 LAB — PROTIME-INR
INR: 1.48
PROTHROMBIN TIME: 17.8 s — AB (ref 11.4–15.2)

## 2018-01-01 MED ORDER — SODIUM CHLORIDE 0.9 % IV SOLN: Freq: Once | INTRAVENOUS | Status: AC

## 2018-01-01 MED ORDER — SODIUM CHLORIDE 0.9 % IV SOLN
Freq: Once | INTRAVENOUS | Status: AC
  Administered 2018-01-01: 10:00:00 via INTRAVENOUS

## 2018-01-01 MED ORDER — WARFARIN SODIUM 2.5 MG PO TABS
2.5000 mg | ORAL_TABLET | Freq: Once | ORAL | Status: AC
  Administered 2018-01-01: 2.5 mg via ORAL
  Filled 2018-01-01: qty 1

## 2018-01-01 NOTE — Progress Notes (Signed)
Velta Addison, RN from 300 came to get a second opinion from ICU nurse on blood transfusion order and the complications with her being able to scan blood product due to a note popping up stating no blood administration ordered, not letting her scan the product. The original order to transfuse had been "completed" in Epic on accident. Per previous electronic order and 2 RNs verifying that the 1 unit originally ordered still needed to be transfused, we placed a new order to transfuse 1 unit or PRBC. This will enable RN to release the blood and scan it through the blood administration flow sheet tab. On new order comment was added that the first order that was placed was accidentally "completed" and that ONLY one unit will be administered. Not sure about blood charge and will follow up with RN and/or unit leader to ensure pt only gets charged for 1 unit of blood.  Skylor Schnapp Rica Mote, RN 01/01/18 2:11 PM

## 2018-01-01 NOTE — Progress Notes (Signed)
Physical Therapy Treatment Patient Details Name: Brittany Hensley MRN: [id number] DOB: November 28, 1950 Today's Date: 01/01/2018    History of Present Illness Brittany Hensley is a 67 y.o. female s/p Right THA 12/30/17 with medical history significant of anxiety comes in with 6 weeks of worsening right leg pain for about 6 weeks now.  She has been walking on it and it has been painful.  She has been doing outpt PT and it has not improved.  Her PCP did xrays today which showed a right femur fracture and sent to ED.  Pt otherwise in normal state of health.  Dr Aline Brochure called and will plan for OR tomorrow for repair.    PT Comments    Patient progresing well with bed mobility now requiring supervision with HOB elevated to perform supine to sit transfer and maintain Rt LE precautions throughout mobility. She has progressed activity tolerance and denied complaints of dizziness or light headedness today. She demonstrated improved tolerance to Rt LE weight bearing with standing marching at bedside using RW and denied increase in pain throughout session. Gait progressed to Min assist/guard for 100' today. At end of session patient performed some seated exercises for Bil LE in bedside chair and RN was present preparing to begin blood transfusion. Patient will benefit from continued physical therapy in hospital and recommended venue below to increase strength, balance, endurance for safe ADLs and gait.  Patient wants to go home and case manager/social worker informed to verify that patient's family can take care of her; patient has been educated on benefits of SNF to improve her safety and function prior to returning home.   Follow Up Recommendations  SNF;Supervision/Assistance - 24 hour     Equipment Recommendations  None recommended by PT    Recommendations for Other Services       Precautions / Restrictions Precautions Precautions: Fall Precaution Comments: s/p Right posterolateral  THA Restrictions Weight Bearing Restrictions: Yes RLE Weight Bearing: Weight bearing as tolerated    Mobility  Bed Mobility Overal bed mobility: Needs Assistance Bed Mobility: Supine to Sit     Supine to sit: Supervision     General bed mobility comments: slightly slow movement, verbal cues to maintain hip precautions on Rt LE  Transfers Overall transfer level: Needs assistance Equipment used: Rolling walker (2 wheeled) Transfers: Sit to/from Stand Sit to Stand: Min guard;Supervision            Ambulation/Gait Ambulation/Gait assistance: Min assist;Min guard Ambulation Distance (Feet): 100 Feet Assistive device: Rolling walker (2 wheeled) Gait Pattern/deviations: Decreased step length - right;Decreased stance time - right;WFL(Within Functional Limits) Gait velocity: slow Gait velocity interpretation: <1.8 ft/sec, indicate of risk for recurrent falls General Gait Details: slow cadence and decreased weight shift to Rt LE, patient with improving upright posture and activity tolerance      Balance Overall balance assessment: Needs assistance Sitting-balance support: Feet supported;No upper extremity supported Sitting balance-Leahy Scale: Good     Standing balance support: Bilateral upper extremity supported;During functional activity Standing balance-Leahy Scale: Fair Standing balance comment: performed standing marching with RW       Cognition Arousal/Alertness: Awake/alert Behavior During Therapy: WFL for tasks assessed/performed Overall Cognitive Status: Within Functional Limits for tasks assessed       Exercises Total Joint Exercises Long Arc Quad: Seated;Strengthening;Both;10 reps Standing Hip Extension: Strengthening;Both;5 reps    General Comments        Pertinent Vitals/Pain Pain Assessment: 0-10 Pain Score: 1  Pain Location: right hip Pain Descriptors /  Indicators: Aching;Sore;Discomfort Pain Intervention(s): Limited activity within patient's  tolerance           PT Goals (current goals can now be found in the care plan section) Acute Rehab PT Goals Patient Stated Goal: return directly home with family to assist and HHPT follow-up PT Goal Formulation: With patient Time For Goal Achievement: 01/14/18 Potential to Achieve Goals: Good Progress towards PT goals: Progressing toward goals    Frequency    7X/week      PT Plan Current plan remains appropriate       AM-PAC PT "6 Clicks" Daily Activity  Outcome Measure  Difficulty turning over in bed (including adjusting bedclothes, sheets and blankets)?: A Little Difficulty moving from lying on back to sitting on the side of the bed? : A Little Difficulty sitting down on and standing up from a chair with arms (e.g., wheelchair, bedside commode, etc,.)?: A Little Help needed moving to and from a bed to chair (including a wheelchair)?: A Little Help needed walking in hospital room?: A Little Help needed climbing 3-5 steps with a railing? : A Lot 6 Click Score: 17    End of Session Equipment Utilized During Treatment: Gait belt Activity Tolerance: Patient tolerated treatment well;Patient limited by fatigue(Paient limited secondary to c/o lightheadedness) Patient left: in chair;with call bell/phone within reach;with nursing/sitter in room Nurse Communication: Mobility status;Other (comment)(RN notified that patient left up in chair) PT Visit Diagnosis: Unsteadiness on feet (R26.81);Other abnormalities of gait and mobility (R26.89);Muscle weakness (generalized) (M62.81)     Time: 1856-3149 PT Time Calculation (min) (ACUTE ONLY): 14 min  Charges:  $Therapeutic Activity: 8-22 mins                    G Codes:       Kipp Brood, PT, DPT Physical Therapist with [facility]  01/01/2018 1:17 PM

## 2018-01-01 NOTE — Progress Notes (Addendum)
Patient ID: Brittany Hensley, female   DOB: [birth year], 67 y.o.   MRN: [id number]  Postop day 2 right total hip for right femoral neck fracture BP 93/60 (BP Location: Right Arm)   Pulse 78   Temp 98.2 F (36.8 C)   Resp 16   Ht 5\' 5"  (1.651 m)   Wt 95 lb 10.9 oz (43.4 kg)   SpO2 97%   BMI 15.92 kg/m   [name] complains of soreness in the right hip today but again says her pain is much better than prior to surgery  She did have some orthostatic hypotension yesterday responded well to fluid bolus.  CBC Latest Ref Rng & Units 01/01/2018 12/31/2017 12/30/2017  WBC 4.0 - 10.5 K/uL 6.3 5.5 3.9(L)  Hemoglobin 12.0 - 15.0 g/dL 8.4(L) 10.3(L) 11.4(L)  Hematocrit 36.0 - 46.0 % 27.3(L) 33.5(L) 36.3  Platelets 150 - 400 K/uL 309 350 376   BMP Latest Ref Rng & Units 01/01/2018 12/31/2017 12/30/2017  Glucose 65 - 99 mg/dL 129(H) 116(H) 78  BUN 6 - 20 mg/dL 28(H) 16 17  Creatinine 0.44 - 1.00 mg/dL 0.93 0.92 1.04(H)  BUN/Creat Ratio 12 - 28 - - -  Sodium 135 - 145 mmol/L 138 142 141  Potassium 3.5 - 5.1 mmol/L 3.9 4.5 3.6  Chloride 101 - 111 mmol/L 115(H) 119(H) 117(H)  CO2 22 - 32 mmol/L 20(L) 21(L) 17(L)  Calcium 8.9 - 10.3 mg/dL 7.6(L) 7.8(L) 7.6(L)   INR/Prothrombin Time  1.5/17.8 Warfarin 2.5 mg daily  Right femoral neck fracture closed Acute blood loss anemia Orthostatic hypotension  Patient will have a dressing change today and 1 unit of blood transfused. Discharge plan tomorrow dressing change today

## 2018-01-02 LAB — TYPE AND SCREEN
ABO/RH(D): O POS
ANTIBODY SCREEN: POSITIVE
Unit division: 0
Unit division: 0

## 2018-01-02 LAB — BPAM RBC
Blood Product Expiration Date: 201906232359
ISSUE DATE / TIME: 201906011045
Unit Type and Rh: 5100

## 2018-01-02 LAB — PROTIME-INR
INR: 1.54
PROTHROMBIN TIME: 18.4 s — AB (ref 11.4–15.2)

## 2018-01-02 LAB — CBC
HEMATOCRIT: 32.9 % — AB (ref 36.0–46.0)
Hemoglobin: 10.3 g/dL — ABNORMAL LOW (ref 12.0–15.0)
MCH: 31.5 pg (ref 26.0–34.0)
MCHC: 31.3 g/dL (ref 30.0–36.0)
MCV: 100.6 fL — ABNORMAL HIGH (ref 78.0–100.0)
Platelets: 294 10*3/uL (ref 150–400)
RBC: 3.27 MIL/uL — AB (ref 3.87–5.11)
RDW: 18.2 % — ABNORMAL HIGH (ref 11.5–15.5)
WBC: 7.1 10*3/uL (ref 4.0–10.5)

## 2018-01-02 MED ORDER — WARFARIN SODIUM 2.5 MG PO TABS
2.5000 mg | ORAL_TABLET | Freq: Once | ORAL | Status: AC
  Administered 2018-01-02: 2.5 mg via ORAL
  Filled 2018-01-02: qty 1

## 2018-01-02 NOTE — Progress Notes (Signed)
Patient ID: NGOC DETJEN, female   DOB: 22-Jun-1951, 67 y.o.   MRN: [id number] Pod 3 right tha   BP 112/64 (BP Location: Left Arm)   Pulse 87   Temp 98.8 F (37.1 C) (Oral)   Resp 18   Ht 5\' 5"  (1.651 m)   Wt 95 lb 10.9 oz (43.4 kg)   SpO2 98%   BMI 15.92 kg/m   Now requests discharge to SNF   CBC Latest Ref Rng & Units 01/02/2018 01/01/2018 01/01/2018  WBC 4.0 - 10.5 K/uL 7.1 - 6.3  Hemoglobin 12.0 - 15.0 g/dL 10.3(L) 10.1(L) 8.4(L)  Hematocrit 36.0 - 46.0 % 32.9(L) 32.2(L) 27.3(L)  Platelets 150 - 400 K/uL 294 - 309    BMP Latest Ref Rng & Units 01/01/2018 12/31/2017 12/30/2017  Glucose 65 - 99 mg/dL 129(H) 116(H) 78  BUN 6 - 20 mg/dL 28(H) 16 17  Creatinine 0.44 - 1.00 mg/dL 0.93 0.92 1.04(H)  BUN/Creat Ratio 12 - 28 - - -  Sodium 135 - 145 mmol/L 138 142 141  Potassium 3.5 - 5.1 mmol/L 3.9 4.5 3.6  Chloride 101 - 111 mmol/L 115(H) 119(H) 117(H)  CO2 22 - 32 mmol/L 20(L) 21(L) 17(L)  Calcium 8.9 - 10.3 mg/dL 7.6(L) 7.8(L) 7.6(L)    Ask soc service to look into snf

## 2018-01-02 NOTE — Progress Notes (Signed)
Physical Therapy Treatment Patient Details Name: Brittany Hensley MRN: [id number] DOB: 07-28-1951 Today's Date: 01/02/2018    History of Present Illness Brittany Hensley is a 67 y.o. female s/p Right THA 12/30/17 with medical history significant of anxiety comes in with 6 weeks of worsening right leg pain for about 6 weeks now.  She has been walking on it and it has been painful.  She has been doing outpt PT and it has not improved.  Her PCP did xrays today which showed a right femur fracture and sent to ED.  Pt otherwise in normal state of health.  Dr Aline Brochure called and will plan for OR tomorrow for repair.    PT Comments    Patient recieved on toilet and assisted with sit to stand transfer training with cues to use grab bars. She demonstrates improved tolerance to standing activities with improved balance during ADL's to wash hands at sink. She continued to progress gait training today with verbal/tactiles cues to facilitate increased step length and improved gait velocity. At end of session patient performed some seated exercises for Bil LE in bedside chair and was educated on supine hip abduction exercises for strengthening; she was instructed not to cross legs per hip precuations. RN present at end of session to give patient medicine.Patient will benefit from continued physical therapy in hospital and recommended venue below to increase strength, balance, endurance for safe ADLs and gait.Patient has been educated on benefits of SNF to improve her safety and function prior to returning home and is agreeable to got there for more therapy as she does not have family available for 24 hour supervision and they are unable to provide assistance needed at this time.    Follow Up Recommendations  SNF;Supervision/Assistance - 24 hour     Equipment Recommendations  None recommended by PT    Recommendations for Other Services       Precautions / Restrictions Precautions Precautions:  Fall Precaution Comments: s/p Right posterolateral THA Restrictions Weight Bearing Restrictions: Yes RLE Weight Bearing: Weight bearing as tolerated    Mobility   Transfers Overall transfer level: Needs assistance Equipment used: Rolling walker (2 wheeled) Transfers: Sit to/from Stand Sit to Stand: Min guard;Supervision Stand pivot transfers: Min guard;Supervision     Ambulation/Gait Ambulation/Gait assistance: Min assist;Min guard Ambulation Distance (Feet): 200 Feet Assistive device: Rolling walker (2 wheeled) Gait Pattern/deviations: Decreased step length - right;Decreased stance time - right;Step-to pattern;Narrow base of support Gait velocity: slow   General Gait Details: verbal cues and visual cues this session for gait training to facilitate increased bil LE step length, stride length, and gati velocity, patietn demonstrated improve speed with cue to push RW like shopping cart and with visual cue to step on the lines between floor tiles to incresae step length       Balance Overall balance assessment: Needs assistance Sitting-balance support: Feet supported;No upper extremity supported Sitting balance-Leahy Scale: Good     Standing balance support: Bilateral upper extremity supported;During functional activity Standing balance-Leahy Scale: Good Standing balance comment: dynamic gait challenges with RW, washing/drying hands at sink with no UE support       Cognition Arousal/Alertness: Awake/alert Behavior During Therapy: WFL for tasks assessed/performed Overall Cognitive Status: Within Functional Limits for tasks assessed              Pertinent Vitals/Pain Pain Assessment: No/denies pain           PT Goals (current goals can now be found in the care  plan section) Acute Rehab PT Goals Patient Stated Goal: return directly home with family to assist and HHPT follow-up PT Goal Formulation: With patient Time For Goal Achievement: 01/14/18 Potential to  Achieve Goals: Good Progress towards PT goals: Progressing toward goals    Frequency    7X/week      PT Plan Current plan remains appropriate       AM-PAC PT "6 Clicks" Daily Activity  Outcome Measure  Difficulty turning over in bed (including adjusting bedclothes, sheets and blankets)?: A Little Difficulty moving from lying on back to sitting on the side of the bed? : A Little Difficulty sitting down on and standing up from a chair with arms (e.g., wheelchair, bedside commode, etc,.)?: A Little Help needed moving to and from a bed to chair (including a wheelchair)?: A Little Help needed walking in hospital room?: A Little Help needed climbing 3-5 steps with a railing? : A Lot 6 Click Score: 17    End of Session Equipment Utilized During Treatment: Gait belt Activity Tolerance: Patient tolerated treatment well;Patient limited by fatigue(Paient limited secondary to c/o lightheadedness) Patient left: in chair;with call bell/phone within reach;with nursing/sitter in room Nurse Communication: Mobility status;Other (comment)(RN notified that patient left up in chair) PT Visit Diagnosis: Unsteadiness on feet (R26.81);Other abnormalities of gait and mobility (R26.89);Muscle weakness (generalized) (M62.81)     Time: 0240-9735 PT Time Calculation (min) (ACUTE ONLY): 23 min  Charges:  $Gait Training: 8-22 mins $Therapeutic Activity: 8-22 mins                    G Codes:       Kipp Brood, PT, DPT Physical Therapist with [facility]  01/02/2018 12:20 PM

## 2018-01-03 DIAGNOSIS — K219 Gastro-esophageal reflux disease without esophagitis: Secondary | ICD-10-CM | POA: Diagnosis not present

## 2018-01-03 DIAGNOSIS — R2689 Other abnormalities of gait and mobility: Secondary | ICD-10-CM | POA: Diagnosis not present

## 2018-01-03 DIAGNOSIS — M8000XD Age-related osteoporosis with current pathological fracture, unspecified site, subsequent encounter for fracture with routine healing: Secondary | ICD-10-CM | POA: Diagnosis not present

## 2018-01-03 DIAGNOSIS — M6281 Muscle weakness (generalized): Secondary | ICD-10-CM | POA: Diagnosis not present

## 2018-01-03 DIAGNOSIS — S72041D Displaced fracture of base of neck of right femur, subsequent encounter for closed fracture with routine healing: Secondary | ICD-10-CM | POA: Diagnosis not present

## 2018-01-03 DIAGNOSIS — E039 Hypothyroidism, unspecified: Secondary | ICD-10-CM | POA: Diagnosis not present

## 2018-01-03 DIAGNOSIS — D500 Iron deficiency anemia secondary to blood loss (chronic): Secondary | ICD-10-CM | POA: Diagnosis not present

## 2018-01-03 DIAGNOSIS — F419 Anxiety disorder, unspecified: Secondary | ICD-10-CM | POA: Diagnosis not present

## 2018-01-03 DIAGNOSIS — L89152 Pressure ulcer of sacral region, stage 2: Secondary | ICD-10-CM | POA: Diagnosis not present

## 2018-01-03 DIAGNOSIS — R636 Underweight: Secondary | ICD-10-CM | POA: Diagnosis not present

## 2018-01-03 LAB — PROTIME-INR
INR: 1.91
Prothrombin Time: 21.8 seconds — ABNORMAL HIGH (ref 11.4–15.2)

## 2018-01-03 MED ORDER — ENSURE ENLIVE PO LIQD: 237.0000 mL | Freq: Two times a day (BID) | ORAL | 12 refills | Status: DC

## 2018-01-03 MED ORDER — DOCUSATE SODIUM 100 MG PO CAPS: 100.0000 mg | ORAL_CAPSULE | Freq: Two times a day (BID) | ORAL | 0 refills | Status: AC

## 2018-01-03 MED ORDER — HYDROCODONE-ACETAMINOPHEN 7.5-325 MG PO TABS: 1.0000 | ORAL_TABLET | ORAL | 0 refills | Status: AC | PRN

## 2018-01-03 MED ORDER — WARFARIN SODIUM 2.5 MG PO TABS: 2.5000 mg | ORAL_TABLET | Freq: Every day | ORAL | 0 refills | Status: DC

## 2018-01-03 NOTE — Clinical Social Work Note (Signed)
Patient now wants to go to SNF for a "few days." LCSW started [facility] [employer] authorization.    Neriyah Cercone, Clydene Pugh, LCSW

## 2018-01-03 NOTE — Care Management Important Message (Signed)
Important Message  Patient Details  Name: Brittany Hensley MRN: [id number] Date of Birth: 03-May-1951   Medicare Important Message Given:  Yes    Shelda Altes 01/03/2018, 2:18 PM

## 2018-01-03 NOTE — Discharge Instructions (Signed)
Weight-bear as tolerated  Remove staples on 13 June  Warfarin 2.5 mg daily check PT/INR on June 10 and then every Monday (warfarin for 25 days)  Hip precautions for direct lateral approach to the hip

## 2018-01-03 NOTE — Progress Notes (Signed)
Patient is to be discharged home and in stable condition. Patient's IV removed, WNL. Report given to Jeani Hawking, LPN at facility. Patient to be transported via SNF's transportation.  Celestia Khat, RN

## 2018-01-03 NOTE — NC FL2 (Signed)
[place] MEDICAID FL2 LEVEL OF CARE SCREENING TOOL     IDENTIFICATION  Patient Name: Brittany Hensley Birthdate: [birth year] Sex: female Admission Date (Current Location): 12/29/2017  [facility] and [place] Number:  [employer] and Address:  [place] [address] [place]      Provider Number: [id number]  Attending Physician Name and Address:  Carole Civil, MD  Relative Name and Phone Number:       Current Level of Care: Hospital(Patient was discharged this morning.) Recommended Level of Care: [place] Prior Approval Number:    Date Approved/Denied:   PASRR Number:    Discharge Plan: SNF    Current Diagnoses: Patient Active Problem List   Diagnosis Date Noted  . Pressure injury of skin 01/01/2018  . Closed displaced fracture of right femoral neck ([place]) 12/30/2017  . Hip fracture, right, closed, initial encounter ([place])   . Femur fracture, right ([place]) 12/29/2017  . Abnormal x-ray of lung 05/21/2017  . BMI less than 19,adult 01/10/2016  . GAD (generalized anxiety disorder) 01/10/2016  . 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  . Hypothyroidism 04/24/2013  . GERD (gastroesophageal reflux disease) 04/24/2013  . Rosacea 04/24/2013  . Iron deficiency anemia 06/09/2012  . Gastric outlet obstruction 06/16/2011  . Atrioventricular nodal re-entry tachycardia ([place]) 03/11/2010  . MURMUR 03/11/2010  . PERSONAL HISTORY OF URINARY CALCULI 02/14/2008    Orientation RESPIRATION BLADDER Height & Weight     Self, Time, Situation, Place  Normal Continent Weight: 95 lb 10.9 oz (43.4 kg) Height:  5\' 5"  (165.1 cm)  BEHAVIORAL SYMPTOMS/MOOD NEUROLOGICAL BOWEL NUTRITION STATUS      Continent Diet(regular)  AMBULATORY STATUS COMMUNICATION OF NEEDS Skin   Supervision Verbally PU Stage and Appropriate Care(Stage II coccyx, mid. Incision: right hip)                       Personal Care Assistance Level of Assistance   Bathing, Feeding, Dressing Bathing Assistance: Limited assistance Feeding assistance: Independent Dressing Assistance: Limited assistance     Functional Limitations Info  Sight, Hearing, Speech Sight Info: Adequate Hearing Info: Adequate Speech Info: Adequate    SPECIAL CARE FACTORS FREQUENCY  PT (By licensed PT)     PT Frequency: 5x/week              Contractures Contractures Info: Not present    Additional Factors Info  Code Status, Allergies, Psychotropic, Isolation Precautions Code Status Info: Full code Allergies Info: Augmentin, Famotidine, Klonopin, Reglan, Nitrofuratoin, Sulfamethoxazole-trimethprim Psychotropic Info: Xanax   Isolation Precautions Info: 12/29/17 Surgical PCR + MRSA, + Staph Aureus; CONTACT precautions        Current Medications (01/03/2018):  This is the current hospital active medication list Current Facility-Administered Medications  Medication Dose Route Frequency Provider Last Rate Last Dose  . acetaminophen (TYLENOL) tablet 325-650 mg  325-650 mg Oral Q6H PRN Carole Civil, MD   650 mg at 01/02/18 2208  . ALPRAZolam Duanne Moron) tablet 0.5 mg  0.5 mg Oral QID PRN Carole Civil, MD   0.5 mg at 01/03/18 0836  . Chlorhexidine Gluconate Cloth 2 % PADS 6 each  6 each Topical Q0600 Carole Civil, MD   6 each at 01/03/18 0534  . docusate sodium (COLACE) capsule 100 mg  100 mg Oral BID Carole Civil, MD   100 mg at 01/03/18 0835  . feeding supplement (ENSURE ENLIVE) (ENSURE ENLIVE) liquid 237 mL  237 mL Oral BID BM  Carole Civil, MD   237 mL at 12/31/17 1625  . gabapentin (NEURONTIN) capsule 300 mg  300 mg Oral TID Carole Civil, MD   300 mg at 01/03/18 0835  . HYDROcodone-acetaminophen (NORCO) 7.5-325 MG per tablet 1 tablet  1 tablet Oral Q4H PRN Carole Civil, MD      . HYDROcodone-acetaminophen (NORCO/VICODIN) 5-325 MG per tablet 1 tablet  1 tablet Oral Q4H PRN Carole Civil, MD   1 tablet at 01/03/18 0836   . levothyroxine (SYNTHROID, LEVOTHROID) tablet 100 mcg  100 mcg Oral QAC breakfast Carole Civil, MD   100 mcg at 01/03/18 0835  . menthol-cetylpyridinium (CEPACOL) lozenge 3 mg  1 lozenge Oral PRN Carole Civil, MD       Or  . phenol (CHLORASEPTIC) mouth spray 1 spray  1 spray Mouth/Throat PRN Carole Civil, MD      . methocarbamol (ROBAXIN) tablet 500 mg  500 mg Oral Q6H PRN Carole Civil, MD       Or  . methocarbamol (ROBAXIN) 500 mg in dextrose 5 % 50 mL IVPB  500 mg Intravenous Q6H PRN Carole Civil, MD      . morphine 2 MG/ML injection 0.5-1 mg  0.5-1 mg Intravenous Q2H PRN Carole Civil, MD      . multivitamin with minerals tablet 1 tablet  1 tablet Oral Daily Carole Civil, MD   1 tablet at 01/03/18 2297  . nutrition supplement (JUVEN) (JUVEN) powder packet 1 packet  1 packet Oral BID BM Carole Civil, MD   1 packet at 01/03/18 [phone]  . ondansetron (ZOFRAN) tablet 4 mg  4 mg Oral Q6H PRN Carole Civil, MD       Or  . ondansetron [facility]) injection 4 mg  4 mg Intravenous Q6H PRN Carole Civil, MD   4 mg at 01/01/18 1104  . pantoprazole (PROTONIX) EC tablet 40 mg  40 mg Oral Daily Carole Civil, MD   40 mg at 01/03/18 0835  . pneumococcal 23 valent vaccine (PNU-IMMUNE) injection 0.5 mL  0.5 mL Intramuscular Tomorrow-1000 Carole Civil, MD      . sertraline (ZOLOFT) tablet 100 mg  100 mg Oral Daily Carole Civil, MD   100 mg at 01/02/18 2204  . sucralfate (CARAFATE) tablet 1 g  1 g Oral TID AC Carole Civil, MD   1 g at 01/03/18 0835  . traMADol (ULTRAM) tablet 50 mg  50 mg Oral Q6H Carole Civil, MD   50 mg at 01/03/18 0532  . Warfarin - Physician Dosing Inpatient   Does not apply q1800 Carole Civil, MD         Discharge Medications: Please see discharge summary for a list of discharge medications.  Relevant Imaging Results:  Relevant Lab Results:   Additional Information SSN [id number]  [id number].  Quency Tober, Clydene Pugh, LCSW

## 2018-01-03 NOTE — Clinical Social Work Placement (Signed)
   CLINICAL SOCIAL WORK PLACEMENT  NOTE  Date:  01/03/2018  Patient Details  Name: Brittany Hensley MRN: [id number] Date of Birth: 03-03-1951  Clinical Social Work is seeking post-discharge placement for this patient at the [place] level of care (*CSW will initial, date and re-position this form in  chart as items are completed):  Yes   Patient/family provided with [place] Work Department's list of facilities offering this level of care within the geographic area requested by the patient (or if unable, by the patient's family).  Yes   Patient/family informed of their freedom to choose among providers that offer the needed level of care, that participate in Medicare, Medicaid or managed care program needed by the patient, have an available bed and are willing to accept the patient.  Yes   Patient/family informed of [facility]'s ownership interest in [facility] and [facility], as well as of the fact that they are under no obligation to receive care at these facilities.  PASRR submitted to EDS on 01/03/18     PASRR number received on 01/03/18     Existing PASRR number confirmed on       FL2 transmitted to all facilities in geographic area requested by pt/family on 01/03/18     FL2 transmitted to all facilities within larger geographic area on       Patient informed that his/her managed care company has contracts with or will negotiate with certain facilities, including the following:        Yes   Patient/family informed of bed offers received.  Patient chooses bed at [facility]     Physician recommends and patient chooses bed at      Patient to be transferred to [facility] on 01/03/18.  Patient to be transferred to facility by [facility]     Patient family notified on 01/03/18 of transfer.  Name of family member notified:  son, [name]     PHYSICIAN       Additional Comment:  LCSW signing off. PASARR number  is [id number].  _______________________________________________ Ihor Gully, LCSW 01/03/2018, 2:16 PM

## 2018-01-03 NOTE — Discharge Summary (Signed)
Physician Discharge Summary  Patient ID: Brittany Hensley MRN: [id number] DOB/AGE: Feb 14, 1951 67 y.o.  Admit date: 12/29/2017 Discharge date: 01/03/2018  Admission Diagnoses: Right femoral neck fracture  Discharge Diagnoses: Same Principal Problem:   Femur fracture, right ([place]) Active Problems:   Hypothyroidism   GERD (gastroesophageal reflux disease)   GAD (generalized anxiety disorder)   Hip fracture, right, closed, initial encounter ([place])   Closed displaced fracture of right femoral neck (HCC)   Pressure injury of skin Acute blood loss anemia  Discharged Condition: good  Hospital Course:  This 67 year old female was admitted with a right hip fracture.  She underwent a right total hip arthroplasty.  She had some mild orthostatic hypotension corrected with fluid bolus.  She had acute blood loss anemia she was given 1 unit of blood.  Her hemoglobin stabilized at 10.1.  She had good pain control ambulated well no complications were noted during the hospital stay.  CBC Latest Ref Rng & Units 01/02/2018 01/01/2018 01/01/2018  WBC 4.0 - 10.5 K/uL 7.1 - 6.3  Hemoglobin 12.0 - 15.0 g/dL 10.3(L) 10.1(L) 8.4(L)  Hematocrit 36.0 - 46.0 % 32.9(L) 32.2(L) 27.3(L)  Platelets 150 - 400 K/uL 294 - 309   BMP Latest Ref Rng & Units 01/01/2018 12/31/2017 12/30/2017  Glucose 65 - 99 mg/dL 129(H) 116(H) 78  BUN 6 - 20 mg/dL 28(H) 16 17  Creatinine 0.44 - 1.00 mg/dL 0.93 0.92 1.04(H)  BUN/Creat Ratio 12 - 28 - - -  Sodium 135 - 145 mmol/L 138 142 141  Potassium 3.5 - 5.1 mmol/L 3.9 4.5 3.6  Chloride 101 - 111 mmol/L 115(H) 119(H) 117(H)  CO2 22 - 32 mmol/L 20(L) 21(L) 17(L)  Calcium 8.9 - 10.3 mg/dL 7.6(L) 7.8(L) 7.6(L)   INR is 1.91 on 2.5 mg of warfarin Discharge Exam: Blood pressure 119/76, pulse 95, temperature 99.8 F (37.7 C), temperature source Oral, resp. rate 16, height 5\' 5"  (1.651 m), weight 95 lb 10.9 oz (43.4 kg), SpO2 97 %. Physical Exam  Constitutional: She appears well-developed and  well-nourished. No distress.  HENT:  Head: Normocephalic.  Eyes: Pupils are equal, round, and reactive to light. EOM are normal.  Cardiovascular: Intact distal pulses.  Musculoskeletal: She exhibits no deformity.  Skin: She is not diaphoretic.     Disposition:    Allergies as of 01/03/2018      Reactions   Augmentin [amoxicillin-pot Clavulanate] Other (See Comments)   Side effects include loss of blood   Famotidine Other (See Comments)   Fever   Klonopin [clonazepam] Other (See Comments)   REACTION: double vision   Reglan [metoclopramide]    Causes confusion, anxiety   Nitrofurantoin Rash   Sulfamethoxazole-trimethoprim Rash      Medication List    STOP taking these medications   acetaminophen 500 MG tablet Commonly known as:  TYLENOL   traMADol 50 MG tablet Commonly known as:  ULTRAM     TAKE these medications   ALPRAZolam 0.5 MG tablet Commonly known as:  XANAX Take 1 tablet (0.5 mg total) by mouth 4 (four) times daily as needed for anxiety.   CALCIUM CITRATE PO Take 600 mg by mouth 2 (two) times daily.   CENTRUM SILVER Chew Chew 1 tablet by mouth 2 (two) times daily.   cholecalciferol 1000 units tablet Commonly known as:  VITAMIN D Take 1,000 Units by mouth 2 (two) times daily.   docusate sodium 100 MG capsule Commonly known as:  COLACE Take 1 capsule (100 mg total) by mouth  2 (two) times daily.   feeding supplement (ENSURE ENLIVE) Liqd Take 237 mLs by mouth 2 (two) times daily between meals.   Flaxseed Oil 1200 MG Caps Take 1,200 mg by mouth daily with lunch.   HYDROcodone-acetaminophen 7.5-325 MG tablet Commonly known as:  NORCO Take 1 tablet by mouth every 4 (four) hours as needed for up to 7 days for moderate pain.   levothyroxine 100 MCG tablet Commonly known as:  SYNTHROID, LEVOTHROID Take 1 tablet (100 mcg total) by mouth every morning.   loratadine 10 MG tablet Commonly known as:  CLARITIN Take 10 mg by mouth daily.   magnesium  gluconate 500 MG tablet Commonly known as:  MAGONATE Take 500 mg by mouth daily with lunch.   omeprazole 40 MG capsule Commonly known as:  PRILOSEC Take 1 capsule (40 mg total) by mouth daily.   PROBIOTIC PO Take 1 capsule by mouth daily. 2 BILLION   sertraline 100 MG tablet Commonly known as:  ZOLOFT Take 2 tablets (200 mg total) by mouth daily. What changed:  how much to take   sucralfate 1 g tablet Commonly known as:  CARAFATE Take 1 g by mouth 3 (three) times daily.   Vitamin C 500 MG Chew Chew 500 mg by mouth 2 (two) times daily.   warfarin 2.5 MG tablet Commonly known as:  COUMADIN Take 1 tablet (2.5 mg total) by mouth daily.      Follow-up Information    Carole Civil, MD Follow up on 01/14/2018.   Specialties:  Orthopedic Surgery, Radiology Contact information: [address] [place] [address] [phone]           Signed: [name] 01/03/2018, 7:44 AM

## 2018-01-03 NOTE — Progress Notes (Signed)
Physical Therapy Treatment Patient Details Name: Brittany Hensley MRN: [id number] DOB: 06/21/1951 Today's Date: 01/03/2018    History of Present Illness Brittany Hensley is a 67 y.o. female s/p Right THA 12/30/17 with medical history significant of anxiety comes in with 6 weeks of worsening right leg pain for about 6 weeks now.  She has been walking on it and it has been painful.  She has been doing outpt PT and it has not improved.  Her PCP did xrays today which showed a right femur fracture and sent to ED.  Pt otherwise in normal state of health.  Dr Aline Brochure called and will plan for OR tomorrow for repair.    PT Comments    Patient demonstrates improvement for moving RLE during bed mobility, required Min guard assist to transferring to commode in bathroom prior to gait training, no loss of balance with fair/good return for right heel to toe stepping and tolerated sitting up in chair to after therapy.  Patient will benefit from continued physical therapy in hospital and recommended venue below to increase strength, balance, endurance for safe ADLs and gait.    Follow Up Recommendations  SNF;Supervision/Assistance - 24 hour     Equipment Recommendations  None recommended by PT    Recommendations for Other Services       Precautions / Restrictions Precautions Precautions: Fall Precaution Comments: s/p Right direct lateral THA Restrictions Weight Bearing Restrictions: Yes RLE Weight Bearing: Weight bearing as tolerated    Mobility  Bed Mobility Overal bed mobility: Needs Assistance Bed Mobility: Supine to Sit     Supine to sit: Supervision        Transfers Overall transfer level: Needs assistance Equipment used: Rolling walker (2 wheeled) Transfers: Sit to/from [facility] Sit to Stand: Supervision Stand pivot transfers: Supervision;Min guard       General transfer comment: slightly labored movement  Ambulation/Gait Ambulation/Gait assistance: Min  guard Ambulation Distance (Feet): 150 Feet Assistive device: Rolling walker (2 wheeled) Gait Pattern/deviations: Decreased step length - right;Decreased stance time - right;Decreased stride length Gait velocity: slow   General Gait Details: slightly labored slow cadence with fair/good return for right heel to toe stepping, limited secondary to fatigue   Stairs             Wheelchair Mobility    Modified Rankin (Stroke Patients Only)       Balance Overall balance assessment: Needs assistance Sitting-balance support: Feet supported;No upper extremity supported Sitting balance-Leahy Scale: Good     Standing balance support: Bilateral upper extremity supported;During functional activity Standing balance-Leahy Scale: Fair Standing balance comment: fair/good with RW                            Cognition Arousal/Alertness: Awake/alert Behavior During Therapy: WFL for tasks assessed/performed Overall Cognitive Status: Within Functional Limits for tasks assessed                                        Exercises      General Comments        Pertinent Vitals/Pain Pain Assessment: 0-10 Pain Score: 8  Pain Location: right hip Pain Descriptors / Indicators: Aching;Sore;Discomfort Pain Intervention(s): Limited activity within patient's tolerance;Monitored during session    Home Living  Prior Function            PT Goals (current goals can now be found in the care plan section) Acute Rehab PT Goals Patient Stated Goal: return home after rehab PT Goal Formulation: With patient Time For Goal Achievement: 01/14/18 Potential to Achieve Goals: Good Progress towards PT goals: Progressing toward goals    Frequency    7X/week      PT Plan Current plan remains appropriate    Co-evaluation              AM-PAC PT "6 Clicks" Daily Activity  Outcome Measure  Difficulty turning over in bed (including  adjusting bedclothes, sheets and blankets)?: A Little Difficulty moving from lying on back to sitting on the side of the bed? : A Little Difficulty sitting down on and standing up from a chair with arms (e.g., wheelchair, bedside commode, etc,.)?: A Little Help needed moving to and from a bed to chair (including a wheelchair)?: A Little Help needed walking in hospital room?: A Little Help needed climbing 3-5 steps with a railing? : A Little 6 Click Score: 18    End of Session   Activity Tolerance: Patient tolerated treatment well;Patient limited by fatigue Patient left: in chair;with call bell/phone within reach;with chair alarm set Nurse Communication: Mobility status PT Visit Diagnosis: Unsteadiness on feet (R26.81);Other abnormalities of gait and mobility (R26.89);Muscle weakness (generalized) (M62.81)     Time: 0258-5277 PT Time Calculation (min) (ACUTE ONLY): 18 min  Charges:  $Gait Training: 8-22 mins                    G Codes:       3:21 PM, Jan 05, 2018 Lonell Grandchild, MPT Physical Therapist with [facility] 336 [phone] office [phone] mobile phone

## 2018-01-03 NOTE — Care Management (Signed)
CM met with patient. Patient reports she now desires SNF.  CSW notified.

## 2018-01-05 ENCOUNTER — Telehealth: Payer: Self-pay | Admitting: Orthopedic Surgery

## 2018-01-05 DIAGNOSIS — E039 Hypothyroidism, unspecified: Secondary | ICD-10-CM | POA: Diagnosis not present

## 2018-01-05 DIAGNOSIS — S72041D Displaced fracture of base of neck of right femur, subsequent encounter for closed fracture with routine healing: Secondary | ICD-10-CM | POA: Diagnosis not present

## 2018-01-05 DIAGNOSIS — F419 Anxiety disorder, unspecified: Secondary | ICD-10-CM | POA: Diagnosis not present

## 2018-01-05 DIAGNOSIS — K219 Gastro-esophageal reflux disease without esophagitis: Secondary | ICD-10-CM | POA: Diagnosis not present

## 2018-01-05 NOTE — Telephone Encounter (Signed)
WBAT I have called to advise / direct lateral hip precautions

## 2018-01-05 NOTE — Telephone Encounter (Signed)
Call received from [facility] - verifying weight-bearing status.  Please call at [phone] and ask for Rehab department.

## 2018-01-06 DIAGNOSIS — E039 Hypothyroidism, unspecified: Secondary | ICD-10-CM | POA: Diagnosis not present

## 2018-01-06 DIAGNOSIS — F419 Anxiety disorder, unspecified: Secondary | ICD-10-CM | POA: Diagnosis not present

## 2018-01-06 DIAGNOSIS — K219 Gastro-esophageal reflux disease without esophagitis: Secondary | ICD-10-CM | POA: Diagnosis not present

## 2018-01-06 DIAGNOSIS — S72041D Displaced fracture of base of neck of right femur, subsequent encounter for closed fracture with routine healing: Secondary | ICD-10-CM | POA: Diagnosis not present

## 2018-01-09 DIAGNOSIS — S72041D Displaced fracture of base of neck of right femur, subsequent encounter for closed fracture with routine healing: Secondary | ICD-10-CM | POA: Diagnosis not present

## 2018-01-09 DIAGNOSIS — K219 Gastro-esophageal reflux disease without esophagitis: Secondary | ICD-10-CM | POA: Diagnosis not present

## 2018-01-09 DIAGNOSIS — D500 Iron deficiency anemia secondary to blood loss (chronic): Secondary | ICD-10-CM | POA: Diagnosis not present

## 2018-01-09 DIAGNOSIS — Z96641 Presence of right artificial hip joint: Secondary | ICD-10-CM | POA: Diagnosis not present

## 2018-01-09 DIAGNOSIS — F411 Generalized anxiety disorder: Secondary | ICD-10-CM | POA: Diagnosis not present

## 2018-01-09 DIAGNOSIS — Z9181 History of falling: Secondary | ICD-10-CM | POA: Diagnosis not present

## 2018-01-09 DIAGNOSIS — L719 Rosacea, unspecified: Secondary | ICD-10-CM | POA: Diagnosis not present

## 2018-01-09 DIAGNOSIS — Z7901 Long term (current) use of anticoagulants: Secondary | ICD-10-CM | POA: Diagnosis not present

## 2018-01-11 ENCOUNTER — Other Ambulatory Visit: Payer: Self-pay

## 2018-01-11 NOTE — Patient Outreach (Signed)
[place] [facility]) Care Management  01/11/2018  Brittany Hensley 01/17/1951 [id number]     Transition of Care Referral  Referral Date: 01/11/18 Referral Source: HTA Discharge Report Date of Admission: unknown Diagnosis: unknown Date of Discharge: 01/08/18 Facility: [facility] [place]: HTA    Outreach attempt # 1 to patient. No answer at present. RN CM left HIPAA compliant voicemail message along with contact info.     Plan: RN CM will make outreach attempt to patient within three business days. RN CM will send unsuccessful outreach letter to patient.   Enzo Montgomery, RN,BSN,CCM [place] Management Telephonic Care Management Coordinator Direct Phone: [phone] Toll Free: [phone] Fax: [phone]

## 2018-01-13 ENCOUNTER — Other Ambulatory Visit: Payer: Self-pay

## 2018-01-13 DIAGNOSIS — Z96641 Presence of right artificial hip joint: Secondary | ICD-10-CM | POA: Insufficient documentation

## 2018-01-13 NOTE — Patient Outreach (Signed)
[place] [facility]) Care Management  01/13/2018  Cerria Randhawa Santarelli [birth year] [id number]   Transition of Care Referral  Referral Date: 01/11/18 Referral Source: HTA Discharge Report Date of Admission: unknown Diagnosis: unknown Date of Discharge: 01/08/18 Facility: [place]: HTA    Outreach attempt #2 to patient. No answer at present.      Plan: RN CM will make outreach attempt to patient within three business days.   Enzo Montgomery, RN,BSN,CCM [place] Management Telephonic Care Management Coordinator Direct Phone: [phone] Toll Free: [phone] Fax: [phone]

## 2018-01-14 ENCOUNTER — Encounter: Payer: Self-pay | Admitting: Orthopedic Surgery

## 2018-01-14 ENCOUNTER — Ambulatory Visit (INDEPENDENT_AMBULATORY_CARE_PROVIDER_SITE_OTHER): Payer: PPO | Admitting: Orthopedic Surgery

## 2018-01-14 VITALS — BP 106/65 | HR 99 | Ht 65.0 in | Wt 91.0 lb

## 2018-01-14 DIAGNOSIS — Z96641 Presence of right artificial hip joint: Secondary | ICD-10-CM

## 2018-01-14 DIAGNOSIS — S72001A Fracture of unspecified part of neck of right femur, initial encounter for closed fracture: Secondary | ICD-10-CM

## 2018-01-14 NOTE — Progress Notes (Signed)
Routine postop visit  Admitting date May 29 discharge date June 3 admitting diagnosis right femoral neck fracture surgery right total hip  Direct lateral approach  Weightbearing status full  Precautions direct lateral hip precautions  Incision: Staples were removed  Wound looks clean dry and intact, mild staple erythema  Homans are negative  Medication for pain: Tylenol  Medication for DVT prevention: Warfarin 2.5 mg daily with weekly pro time INR goal 2.0

## 2018-01-14 NOTE — Patient Instructions (Signed)
Continue weightbearing as tolerated  Continue therapy and try to advance to a cane  Follow-up in 4 weeks

## 2018-01-17 ENCOUNTER — Telehealth: Payer: Self-pay

## 2018-01-17 ENCOUNTER — Other Ambulatory Visit: Payer: Self-pay

## 2018-01-17 ENCOUNTER — Telehealth: Payer: Self-pay | Admitting: Radiology

## 2018-01-17 NOTE — Telephone Encounter (Signed)
Patient was here on 01/14/18 and you told her to continue the Coumadin for 2 more weeks then she could d/c   Her INR today is 4.3, instead of adjusting dose and repeating INR, is it okay if she d/c early  Please advise

## 2018-01-17 NOTE — Telephone Encounter (Signed)
Home health nurse called office to make [place] aware that her INR had a reading of 4.3. Patient states that specialist Dr Aline Brochure who performed her hip surgery told her that she would be stopping the coumadin next. I called and spoke with Dr Althia Forts nurse and they advised for her to stop the coumadin immediately. Attempted to call the home health nurse x3 and her voicemail was full. Contacted patient and notified her to stop coumadin. Also made her an appt for the AM to have other hip rechecked. Patient verbalized understanding. Will attempt to notified the [facility] nurse in the AM as well.

## 2018-01-17 NOTE — Telephone Encounter (Signed)
I called Mandy to advise at [facility] and called patient also. Dr Aline Brochure wants her to d/c now. Leafy Ro has advised me she will contact [place] to let them know as well I had to leave message for patient asked her to call me back   Leafy Ro also states she will contact patient.

## 2018-01-17 NOTE — Patient Outreach (Signed)
[place] [facility]) Care Management  01/17/2018  ZOANNE NEWILL 04/14/1951 [id number]   Transition of Care Referral  Referral Date:01/11/18 Referral Source:HTA Discharge Report Date of Admission:unknown Diagnosis:unknown Date of Discharge:01/08/18 [place] attempt #3 to patient. No answer at present and unable to leave message.      Plan: RN CM will close case if no response from letter mailed to patient.   Enzo Montgomery, RN,BSN,CCM [place] Management Telephonic Care Management Coordinator Direct Phone: [phone] Toll Free: [phone] Fax: [phone]

## 2018-01-18 ENCOUNTER — Ambulatory Visit: Payer: PPO | Admitting: Nurse Practitioner

## 2018-01-18 ENCOUNTER — Ambulatory Visit (INDEPENDENT_AMBULATORY_CARE_PROVIDER_SITE_OTHER): Payer: PPO | Admitting: Nurse Practitioner

## 2018-01-18 ENCOUNTER — Ambulatory Visit (INDEPENDENT_AMBULATORY_CARE_PROVIDER_SITE_OTHER): Payer: PPO

## 2018-01-18 ENCOUNTER — Encounter: Payer: Self-pay | Admitting: Nurse Practitioner

## 2018-01-18 VITALS — BP 93/57 | HR 93 | Temp 97.4°F | Ht 65.0 in | Wt 99.0 lb

## 2018-01-18 DIAGNOSIS — M25552 Pain in left hip: Secondary | ICD-10-CM

## 2018-01-18 MED ORDER — DICLOFENAC SODIUM 1 % TD GEL: 4.0000 g | Freq: Four times a day (QID) | TRANSDERMAL | 1 refills | Status: DC

## 2018-01-18 NOTE — Telephone Encounter (Signed)
Thanks I did speak to her yesterday. She states she is having some pain now with her non operative hip, and will have her primary care look at this when she goes back there 01/18/18 (today). I have told her they will let us know if we need to look at this for her as well.   To you FYI only

## 2018-01-18 NOTE — Telephone Encounter (Signed)
Ok to stop

## 2018-01-18 NOTE — Progress Notes (Signed)
   Subjective:    Patient ID: Brittany Hensley, female    DOB: 07-31-1951, 67 y.o.   MRN: [id number]   Chief Complaint: Hip Pain   HPI Patient was moving heavy boxes  on 12/29/17 and broke her right hip. They ended up doing a total hip replacement. She is still taking PT. But she is c/o pain in her left hip. Hurts bad to walk on. Wants to make sure she did not injure her left hip and no one checked it.   Review of Systems  Respiratory: Negative.   Cardiovascular: Negative.   Musculoskeletal: Positive for arthralgias (left hip pain).  Neurological: Negative.   Psychiatric/Behavioral: Negative.   All other systems reviewed and are negative.      Objective:   Physical Exam  Constitutional: She appears well-developed and well-nourished. She appears distressed (moderate).  Cardiovascular: Normal rate.  Pulmonary/Chest: Effort normal.  Neurological: She is alert.  Skin: Skin is warm and dry.  Psychiatric: She has a normal mood and affect. Her behavior is normal. Judgment and thought content normal.   BP (!) 93/57   Pulse 93   Temp (!) 97.4 F (36.3 C) (Oral)   Ht 5\' 5"  (1.651 m)   Wt 99 lb (44.9 kg)   BMI 16.47 kg/m   Left hip xray- no fracture-Preliminary reading by Ronnald Collum, FNP  [facility]        Assessment & Plan:  Brittany Hensley in today with chief complaint of Left hip pain   1. Pain of left hip joint Rest when can Rise slowly from sitting to standing - DG HIP UNILAT W OR W/O PELVIS 2-3 VIEWS LEFT; Future  Mary-Margaret Hassell Done, FNP

## 2018-01-18 NOTE — Patient Instructions (Signed)

## 2018-01-20 ENCOUNTER — Ambulatory Visit (INDEPENDENT_AMBULATORY_CARE_PROVIDER_SITE_OTHER): Payer: PPO

## 2018-01-20 DIAGNOSIS — L719 Rosacea, unspecified: Secondary | ICD-10-CM | POA: Diagnosis not present

## 2018-01-20 DIAGNOSIS — Z9181 History of falling: Secondary | ICD-10-CM | POA: Diagnosis not present

## 2018-01-20 DIAGNOSIS — D500 Iron deficiency anemia secondary to blood loss (chronic): Secondary | ICD-10-CM | POA: Diagnosis not present

## 2018-01-20 DIAGNOSIS — F411 Generalized anxiety disorder: Secondary | ICD-10-CM | POA: Diagnosis not present

## 2018-01-20 DIAGNOSIS — Z96641 Presence of right artificial hip joint: Secondary | ICD-10-CM

## 2018-01-20 DIAGNOSIS — Z7901 Long term (current) use of anticoagulants: Secondary | ICD-10-CM | POA: Diagnosis not present

## 2018-01-20 DIAGNOSIS — K219 Gastro-esophageal reflux disease without esophagitis: Secondary | ICD-10-CM

## 2018-01-20 DIAGNOSIS — S72041D Displaced fracture of base of neck of right femur, subsequent encounter for closed fracture with routine healing: Secondary | ICD-10-CM | POA: Diagnosis not present

## 2018-01-26 ENCOUNTER — Telehealth: Payer: Self-pay | Admitting: Nurse Practitioner

## 2018-01-26 ENCOUNTER — Telehealth: Payer: Self-pay | Admitting: Orthopedic Surgery

## 2018-01-26 MED ORDER — FUROSEMIDE 20 MG PO TABS: 20.0000 mg | ORAL_TABLET | Freq: Every day | ORAL | 0 refills | Status: DC | PRN

## 2018-01-26 NOTE — Telephone Encounter (Signed)
Sent in. Will need follow up blood work next appt.

## 2018-01-26 NOTE — Telephone Encounter (Signed)
No, Lasix will need to be from her primary care. I have called to advise left message  To you FYI

## 2018-01-26 NOTE — Telephone Encounter (Signed)
Patient is asking if Dr. Aline Brochure will prescribe her a script for Lasix.   PATIENT USES THE DRUG STORE IN [place]

## 2018-01-26 NOTE — Telephone Encounter (Signed)
Pt c/o of swelling in both legs for 2 mths. She is requesting MMM send in lasix. Pt is elevating her legs all through out the day. Pt uses the Drug store in [place]. Please advise.

## 2018-01-27 ENCOUNTER — Ambulatory Visit: Payer: PPO | Admitting: Nurse Practitioner

## 2018-01-31 ENCOUNTER — Other Ambulatory Visit: Payer: Self-pay

## 2018-01-31 NOTE — Patient Outreach (Signed)
[place] [facility]) Care Management  01/31/2018  MASSA PE 1950-08-07 [id number]   Transition of Care Referral  Referral Date:01/11/18 Referral Source:HTA Discharge Report Date of Admission:unknown Diagnosis:unknown Date of Discharge:01/08/18 [place]    Multiple attempts to establish contact with patient without success. No response from letter mailed to patient. Case is being closed at this time.     Plan: RN CM will close case at this time.   Enzo Montgomery, RN,BSN,CCM [place] Management Telephonic Care Management Coordinator Direct Phone: [phone] Toll Free: [phone] Fax: [phone]

## 2018-02-01 ENCOUNTER — Telehealth: Payer: Self-pay | Admitting: Nurse Practitioner

## 2018-02-01 MED ORDER — CYCLOBENZAPRINE HCL 5 MG PO TABS: 5.0000 mg | ORAL_TABLET | Freq: Three times a day (TID) | ORAL | 1 refills | Status: DC | PRN

## 2018-02-01 NOTE — Telephone Encounter (Signed)
Pt aware.

## 2018-02-01 NOTE — Telephone Encounter (Signed)
Flexeril 5mg  qhs

## 2018-02-08 ENCOUNTER — Telehealth: Payer: Self-pay | Admitting: Nurse Practitioner

## 2018-02-08 NOTE — Telephone Encounter (Signed)
If you are talking about voltaren gel that is as strong as it comes

## 2018-02-08 NOTE — Telephone Encounter (Signed)
Pt notified that there is no stronger strength of voltaren gel Verbalizes understanding

## 2018-02-10 ENCOUNTER — Ambulatory Visit: Payer: PPO | Admitting: Nurse Practitioner

## 2018-02-14 ENCOUNTER — Ambulatory Visit (INDEPENDENT_AMBULATORY_CARE_PROVIDER_SITE_OTHER): Payer: PPO

## 2018-02-14 ENCOUNTER — Ambulatory Visit (INDEPENDENT_AMBULATORY_CARE_PROVIDER_SITE_OTHER): Payer: PPO | Admitting: Orthopedic Surgery

## 2018-02-14 ENCOUNTER — Other Ambulatory Visit: Payer: Self-pay

## 2018-02-14 ENCOUNTER — Inpatient Hospital Stay (HOSPITAL_COMMUNITY)
Admission: AD | Admit: 2018-02-14 | Discharge: 2018-02-21 | DRG: 470 | Disposition: A | Discharge status: Skilled Nursing Facility | Payer: PPO | Source: Ambulatory Visit | Attending: Orthopedic Surgery | Admitting: Orthopedic Surgery

## 2018-02-14 ENCOUNTER — Encounter: Payer: Self-pay | Admitting: Orthopedic Surgery

## 2018-02-14 VITALS — BP 149/91 | HR 90 | Ht 65.0 in | Wt 88.0 lb

## 2018-02-14 DIAGNOSIS — K219 Gastro-esophageal reflux disease without esophagitis: Secondary | ICD-10-CM | POA: Diagnosis not present

## 2018-02-14 DIAGNOSIS — M25552 Pain in left hip: Secondary | ICD-10-CM

## 2018-02-14 DIAGNOSIS — Z888 Allergy status to other drugs, medicaments and biological substances status: Secondary | ICD-10-CM | POA: Diagnosis not present

## 2018-02-14 DIAGNOSIS — E876 Hypokalemia: Secondary | ICD-10-CM | POA: Diagnosis not present

## 2018-02-14 DIAGNOSIS — S72002A Fracture of unspecified part of neck of left femur, initial encounter for closed fracture: Secondary | ICD-10-CM

## 2018-02-14 DIAGNOSIS — Z96641 Presence of right artificial hip joint: Secondary | ICD-10-CM | POA: Diagnosis present

## 2018-02-14 DIAGNOSIS — M1612 Unilateral primary osteoarthritis, left hip: Secondary | ICD-10-CM | POA: Diagnosis not present

## 2018-02-14 DIAGNOSIS — D500 Iron deficiency anemia secondary to blood loss (chronic): Secondary | ICD-10-CM | POA: Diagnosis not present

## 2018-02-14 DIAGNOSIS — E039 Hypothyroidism, unspecified: Secondary | ICD-10-CM | POA: Diagnosis not present

## 2018-02-14 DIAGNOSIS — D62 Acute posthemorrhagic anemia: Secondary | ICD-10-CM | POA: Diagnosis not present

## 2018-02-14 DIAGNOSIS — M6281 Muscle weakness (generalized): Secondary | ICD-10-CM | POA: Diagnosis not present

## 2018-02-14 DIAGNOSIS — M80052A Age-related osteoporosis with current pathological fracture, left femur, initial encounter for fracture: Secondary | ICD-10-CM | POA: Diagnosis not present

## 2018-02-14 DIAGNOSIS — F419 Anxiety disorder, unspecified: Secondary | ICD-10-CM | POA: Diagnosis not present

## 2018-02-14 DIAGNOSIS — Z96642 Presence of left artificial hip joint: Secondary | ICD-10-CM | POA: Diagnosis not present

## 2018-02-14 DIAGNOSIS — Z882 Allergy status to sulfonamides status: Secondary | ICD-10-CM

## 2018-02-14 DIAGNOSIS — Z881 Allergy status to other antibiotic agents status: Secondary | ICD-10-CM | POA: Diagnosis not present

## 2018-02-14 DIAGNOSIS — Z471 Aftercare following joint replacement surgery: Secondary | ICD-10-CM | POA: Diagnosis not present

## 2018-02-14 DIAGNOSIS — Z96643 Presence of artificial hip joint, bilateral: Secondary | ICD-10-CM | POA: Diagnosis not present

## 2018-02-14 DIAGNOSIS — R636 Underweight: Secondary | ICD-10-CM | POA: Diagnosis not present

## 2018-02-14 DIAGNOSIS — F411 Generalized anxiety disorder: Secondary | ICD-10-CM

## 2018-02-14 DIAGNOSIS — S72042D Displaced fracture of base of neck of left femur, subsequent encounter for closed fracture with routine healing: Secondary | ICD-10-CM | POA: Diagnosis not present

## 2018-02-14 DIAGNOSIS — M8000XD Age-related osteoporosis with current pathological fracture, unspecified site, subsequent encounter for fracture with routine healing: Secondary | ICD-10-CM | POA: Diagnosis not present

## 2018-02-14 LAB — CBC WITH DIFFERENTIAL/PLATELET
BASOS PCT: 0 %
Basophils Absolute: 0 10*3/uL (ref 0.0–0.1)
EOS ABS: 0.1 10*3/uL (ref 0.0–0.7)
Eosinophils Relative: 1 %
HCT: 32.3 % — ABNORMAL LOW (ref 36.0–46.0)
Hemoglobin: 10 g/dL — ABNORMAL LOW (ref 12.0–15.0)
Lymphocytes Relative: 19 %
Lymphs Abs: 1 10*3/uL (ref 0.7–4.0)
MCH: 31.8 pg (ref 26.0–34.0)
MCHC: 31 g/dL (ref 30.0–36.0)
MCV: 102.9 fL — ABNORMAL HIGH (ref 78.0–100.0)
Monocytes Absolute: 0.6 10*3/uL (ref 0.1–1.0)
Monocytes Relative: 11 %
NEUTROS ABS: 3.6 10*3/uL (ref 1.7–7.7)
NEUTROS PCT: 69 %
Platelets: 273 10*3/uL (ref 150–400)
RBC: 3.14 MIL/uL — AB (ref 3.87–5.11)
RDW: 20 % — ABNORMAL HIGH (ref 11.5–15.5)
WBC: 5.3 10*3/uL (ref 4.0–10.5)

## 2018-02-14 LAB — COMPREHENSIVE METABOLIC PANEL
ALBUMIN: 2.7 g/dL — AB (ref 3.5–5.0)
ALK PHOS: 97 U/L (ref 38–126)
ALT: 22 U/L (ref 0–44)
AST: 26 U/L (ref 15–41)
Anion gap: 6 (ref 5–15)
BUN: 30 mg/dL — AB (ref 8–23)
CALCIUM: 7.5 mg/dL — AB (ref 8.9–10.3)
CO2: 17 mmol/L — AB (ref 22–32)
CREATININE: 1.09 mg/dL — AB (ref 0.44–1.00)
Chloride: 117 mmol/L — ABNORMAL HIGH (ref 98–111)
GFR calc Af Amer: 59 mL/min — ABNORMAL LOW (ref >60)
GFR calc non Af Amer: 51 mL/min — ABNORMAL LOW (ref >60)
GLUCOSE: 124 mg/dL — AB (ref 70–99)
Potassium: <2 mmol/L — CL (ref 3.5–5.1)
SODIUM: 140 mmol/L (ref 135–145)
Total Bilirubin: 0.4 mg/dL (ref 0.3–1.2)
Total Protein: 5.4 g/dL — ABNORMAL LOW (ref 6.5–8.1)

## 2018-02-14 LAB — PROTIME-INR
INR: 0.99
PROTHROMBIN TIME: 12.9 s (ref 11.4–15.2)

## 2018-02-14 LAB — APTT: APTT: 32 s (ref 24–36)

## 2018-02-14 MED ORDER — ENSURE ENLIVE PO LIQD
237.0000 mL | Freq: Two times a day (BID) | ORAL | Status: DC
  Administered 2018-02-18 (×2): 237 mL via ORAL

## 2018-02-14 MED ORDER — CHLORHEXIDINE GLUCONATE 4 % EX LIQD
60.0000 mL | Freq: Once | CUTANEOUS | Status: AC
  Administered 2018-02-15: 4 via TOPICAL
  Filled 2018-02-14: qty 60

## 2018-02-14 MED ORDER — SODIUM CHLORIDE 0.9% IV SOLUTION: Freq: Once | INTRAVENOUS | Status: DC

## 2018-02-14 MED ORDER — HEPARIN SODIUM (PORCINE) 5000 UNIT/ML IJ SOLN: 5000.0000 [IU] | Freq: Once | INTRAMUSCULAR | Status: DC

## 2018-02-14 MED ORDER — POTASSIUM CHLORIDE CRYS ER 20 MEQ PO TBCR
20.0000 meq | EXTENDED_RELEASE_TABLET | ORAL | Status: DC | PRN
  Administered 2018-02-14 – 2018-02-15 (×2): 20 meq via ORAL
  Filled 2018-02-14 (×2): qty 1

## 2018-02-14 MED ORDER — MAGNESIUM OXIDE 400 (241.3 MG) MG PO TABS
400.0000 mg | ORAL_TABLET | Freq: Every day | ORAL | Status: DC
  Administered 2018-02-15 – 2018-02-21 (×7): 400 mg via ORAL
  Filled 2018-02-14 (×7): qty 1

## 2018-02-14 MED ORDER — ADULT MULTIVITAMIN W/MINERALS CH
1.0000 | ORAL_TABLET | Freq: Two times a day (BID) | ORAL | Status: DC
  Administered 2018-02-14 – 2018-02-21 (×13): 1 via ORAL
  Filled 2018-02-14 (×13): qty 1

## 2018-02-14 MED ORDER — CYCLOBENZAPRINE HCL 10 MG PO TABS
5.0000 mg | ORAL_TABLET | Freq: Three times a day (TID) | ORAL | Status: DC | PRN
  Administered 2018-02-15 – 2018-02-20 (×9): 5 mg via ORAL
  Filled 2018-02-14 (×10): qty 1

## 2018-02-14 MED ORDER — VANCOMYCIN HCL IN DEXTROSE 1-5 GM/200ML-% IV SOLN: 1000.0000 mg | INTRAVENOUS | Status: AC

## 2018-02-14 MED ORDER — VITAMIN D 1000 UNITS PO TABS
1000.0000 [IU] | ORAL_TABLET | Freq: Two times a day (BID) | ORAL | Status: DC
  Administered 2018-02-14 – 2018-02-21 (×13): 1000 [IU] via ORAL
  Filled 2018-02-14 (×13): qty 1

## 2018-02-14 MED ORDER — CALCIUM CITRATE 950 (200 CA) MG PO TABS
475.0000 mg | ORAL_TABLET | Freq: Two times a day (BID) | ORAL | Status: DC
  Filled 2018-02-14 (×4): qty 1

## 2018-02-14 MED ORDER — FUROSEMIDE 20 MG PO TABS: 20.0000 mg | ORAL_TABLET | Freq: Every day | ORAL | Status: DC | PRN

## 2018-02-14 MED ORDER — FLAXSEED OIL 1200 MG PO CAPS: 1200.0000 mg | ORAL_CAPSULE | Freq: Every day | ORAL | Status: DC

## 2018-02-14 MED ORDER — LEVOTHYROXINE SODIUM 100 MCG PO TABS
100.0000 ug | ORAL_TABLET | Freq: Every morning | ORAL | Status: DC
  Administered 2018-02-15 – 2018-02-21 (×6): 100 ug via ORAL
  Filled 2018-02-14 (×7): qty 1

## 2018-02-14 MED ORDER — SERTRALINE HCL 50 MG PO TABS
200.0000 mg | ORAL_TABLET | Freq: Every day | ORAL | Status: DC
  Administered 2018-02-15 – 2018-02-20 (×6): 200 mg via ORAL
  Filled 2018-02-14 (×7): qty 4

## 2018-02-14 MED ORDER — LORATADINE 10 MG PO TABS
10.0000 mg | ORAL_TABLET | Freq: Every day | ORAL | Status: DC
  Administered 2018-02-14 – 2018-02-21 (×7): 10 mg via ORAL
  Filled 2018-02-14 (×7): qty 1

## 2018-02-14 MED ORDER — PANTOPRAZOLE SODIUM 40 MG PO TBEC
80.0000 mg | DELAYED_RELEASE_TABLET | Freq: Every day | ORAL | Status: DC
  Administered 2018-02-14 – 2018-02-21 (×7): 80 mg via ORAL
  Filled 2018-02-14 (×7): qty 2

## 2018-02-14 MED ORDER — RISAQUAD PO CAPS
1.0000 | ORAL_CAPSULE | Freq: Every day | ORAL | Status: DC
  Administered 2018-02-14 – 2018-02-21 (×7): 1 via ORAL
  Filled 2018-02-14 (×7): qty 1

## 2018-02-14 MED ORDER — CALCIUM CITRATE 950 (200 CA) MG PO TABS
100.0000 mg | ORAL_TABLET | Freq: Two times a day (BID) | ORAL | Status: DC
  Administered 2018-02-14 – 2018-02-21 (×12): 100 mg via ORAL
  Filled 2018-02-14 (×19): qty 1

## 2018-02-14 MED ORDER — SUCRALFATE 1 G PO TABS
1.0000 g | ORAL_TABLET | Freq: Three times a day (TID) | ORAL | Status: DC
  Administered 2018-02-14 – 2018-02-21 (×19): 1 g via ORAL
  Filled 2018-02-14 (×19): qty 1

## 2018-02-14 MED ORDER — VITAMIN C 500 MG PO TABS
500.0000 mg | ORAL_TABLET | Freq: Two times a day (BID) | ORAL | Status: DC
  Administered 2018-02-14 – 2018-02-21 (×13): 500 mg via ORAL
  Filled 2018-02-14 (×13): qty 1

## 2018-02-14 MED ORDER — SODIUM CHLORIDE 0.9 % IV SOLN
INTRAVENOUS | Status: DC
  Administered 2018-02-14 – 2018-02-20 (×9): via INTRAVENOUS

## 2018-02-14 MED ORDER — ALPRAZOLAM 0.5 MG PO TABS
0.5000 mg | ORAL_TABLET | Freq: Four times a day (QID) | ORAL | Status: DC | PRN
  Administered 2018-02-14 – 2018-02-21 (×20): 0.5 mg via ORAL
  Filled 2018-02-14 (×20): qty 1

## 2018-02-14 MED ORDER — DOCUSATE SODIUM 100 MG PO CAPS
100.0000 mg | ORAL_CAPSULE | Freq: Two times a day (BID) | ORAL | Status: DC
  Administered 2018-02-14 – 2018-02-21 (×13): 100 mg via ORAL
  Filled 2018-02-14 (×14): qty 1

## 2018-02-14 NOTE — Progress Notes (Signed)
Patient ID: Brittany Hensley, female   DOB: 03-12-1951, 67 y.o.   MRN: [id number] CBC Latest Ref Rng & Units 02/14/2018 01/02/2018 01/01/2018  WBC 4.0 - 10.5 K/uL 5.3 7.1 -  Hemoglobin 12.0 - 15.0 g/dL 10.0(L) 10.3(L) 10.1(L)  Hematocrit 36.0 - 46.0 % 32.3(L) 32.9(L) 32.2(L)  Platelets 150 - 400 K/uL 273 294 -   BMP Latest Ref Rng & Units 02/14/2018 01/01/2018 12/31/2017  Glucose 70 - 99 mg/dL 124(H) 129(H) 116(H)  BUN 8 - 23 mg/dL 30(H) 28(H) 16  Creatinine 0.44 - 1.00 mg/dL 1.09(H) 0.93 0.92  BUN/Creat Ratio 12 - 28 - - -  Sodium 135 - 145 mmol/L 140 138 142  Potassium 3.5 - 5.1 mmol/L <2.0(LL) 3.9 4.5  Chloride 98 - 111 mmol/L 117(H) 115(H) 119(H)  CO2 22 - 32 mmol/L 17(L) 20(L) 21(L)  Calcium 8.9 - 10.3 mg/dL 7.5(L) 7.6(L) 7.8(L)

## 2018-02-14 NOTE — Progress Notes (Addendum)
Chief Complaint  Patient presents with  . Post-op Follow-up    12/30/17 right THR     67 year old female had a right total hip for right  hip fracture; 6 weeks ago.  She presents now with left hip pain groin pain anterior thigh pain  Exam is remarkable for painless range of motion of the hip full range of motion of the left hip  She is still using her walker.  X-ray shows: Left hip fracture  Chief complaint pain left hip  History 67 years old status post right total hip on May 30 for right hip fracture presents with left hip pain had an x-ray June 18 which was normal.  She presents now with continuing and worsening left hip pain when she is walking no real pain at rest seems to be worse when she gets up from sitting pain is mild she is still requiring the use of her walker and says her right hip feels fine.  Review of systems weight loss of 10 or 11 pounds  Review of Systems  All other systems reviewed and are negative.  Past Medical History:  Diagnosis Date  . Anemia    iron   . Anxiety   . Diverticulitis    Hx: of  . GERD (gastroesophageal reflux disease)    at times  . History of kidney stones    10 years ago  . Hypothyroidism   . Low iron   . Pneumonia   . Rosacea    Hx: of   Past Surgical History:  Procedure Laterality Date  . APPENDECTOMY    . BALLOON DILATION  06/16/2011   Procedure: BALLOON DILATION;  Surgeon: Jeryl Columbia, MD;  Location: [facility] ENDOSCOPY;  Service: Endoscopy;  Laterality: N/A;  . BALLOON DILATION N/A 10/03/2013   Procedure: BALLOON DILATION;  Surgeon: Jeryl Columbia, MD;  Location: [facility] ENDOSCOPY;  Service: Endoscopy;  Laterality: N/A;  . BALLOON DILATION N/A 08/10/2014   Procedure: BALLOON DILATION;  Surgeon: Jeryl Columbia, MD;  Location: [facility] ENDOSCOPY;  Service: Endoscopy;  Laterality: N/A;  from 12 - 15 cm dilation completed  . BALLOON DILATION N/A 02/15/2015   Procedure: BALLOON DILATION;  Surgeon: Clarene Essex, MD;  Location: [facility] ENDOSCOPY;  Service:  Endoscopy;  Laterality: N/A;  . BALLOON DILATION N/A 11/12/2015   Procedure: BALLOON DILATION;  Surgeon: Clarene Essex, MD;  [facility] ENDOSCOPY;  Service: Endoscopy;  Laterality: N/A;  . BALLOON DILATION N/A 08/10/2016   Procedure: BALLOON DILATION;  Surgeon: Clarene Essex, MD;  Location: [facility] ENDOSCOPY;  Service: Endoscopy;  Laterality: N/A;  . BALLOON DILATION N/A 08/07/2016   Procedure: BALLOON DILATION;  Surgeon: Clarene Essex, MD;  Location: [facility] ENDOSCOPY;  Service: Endoscopy;  Laterality: N/A;  . BALLOON DILATION N/A 05/20/2017   Procedure: BALLOON DILATION;  Surgeon: Clarene Essex, MD;  [facility] ENDOSCOPY;  Service: Endoscopy;  Laterality: N/A;  . BALLOON DILATION N/A 12/02/2017   Procedure: BALLOON DILATION;  Surgeon: Clarene Essex, MD;  Location: [place];  Service: Endoscopy;  Laterality: N/A;  . CHOLECYSTECTOMY OPEN  1979  . COLONOSCOPY     Hx: of  . ESOPHAGOGASTRODUODENOSCOPY  06/16/2011   Procedure: ESOPHAGOGASTRODUODENOSCOPY (EGD);  Surgeon: Jeryl Columbia, MD;  Location: [facility] ENDOSCOPY;  Service: Endoscopy;  Laterality: N/A;  . ESOPHAGOGASTRODUODENOSCOPY N/A 10/03/2013   Procedure: ESOPHAGOGASTRODUODENOSCOPY (EGD);  Surgeon: Jeryl Columbia, MD;  Location: [facility] ENDOSCOPY;  Service: Endoscopy;  Laterality: N/A;  . ESOPHAGOGASTRODUODENOSCOPY N/A 08/10/2014   Procedure: ESOPHAGOGASTRODUODENOSCOPY (EGD);  Surgeon: Altamese Dilling  Drema Balzarine, MD;  [facility] ENDOSCOPY;  Service: Endoscopy;  Laterality: N/A;  . ESOPHAGOGASTRODUODENOSCOPY N/A 08/10/2016   Procedure: ESOPHAGOGASTRODUODENOSCOPY (EGD);  Surgeon: Clarene Essex, MD;  Location[facility] ENDOSCOPY;  Service: Endoscopy;  Laterality: N/A;  . ESOPHAGOGASTRODUODENOSCOPY (EGD) WITH PROPOFOL N/A 02/15/2015   Procedure: ESOPHAGOGASTRODUODENOSCOPY (EGD) WITH PROPOFOL;  Surgeon: Clarene Essex, MD;  [facility] ENDOSCOPY;  Service: Endoscopy;  Laterality: N/A;  . ESOPHAGOGASTRODUODENOSCOPY (EGD) WITH PROPOFOL N/A 11/12/2015   Procedure: ESOPHAGOGASTRODUODENOSCOPY (EGD) WITH PROPOFOL;   Surgeon: Clarene Essex, MD;  [facility]OS[facility];  Service: Endoscopy;  Laterality: N/A;  . ESOPHAGOGASTRODUODENOSCOPY (EGD) WITH PROPOFOL N/A 08/07/2016   Procedure: ESOPHAGOGASTRODUODENOSCOPY (EGD) WITH PROPOFOL;  Surgeon: Clarene Essex, MD;  Location: [facility] ENDOSCOPY;  Service: Endoscopy;  Laterality: N/A;  . ESOPHAGOGASTRODUODENOSCOPY (EGD) WITH PROPOFOL N/A 05/20/2017   Procedure: ESOPHAGOGASTRODUODENOSCOPY (EGD) WITH PROPOFOL;  Surgeon: Clarene Essex, MD;  [facility] ENDOS[facility];  Service: Endoscopy;  Laterality: N/A;  . ESOPHAGOGASTRODUODENOSCOPY (EGD) WITH PROPOFOL N/A 12/02/2017   Procedure: ESOPHAGOGASTRODUODENOSCOPY (EGD) WITH PROPOFOL;  Surgeon: Clarene Essex, MD;  Location: [place];  Service: Endoscopy;  Laterality: N/A;  have c arm available  . FRACTURE SURGERY     Hx: of left wrist  . GASTRECTOMY  [phone]  . GASTRECTOMY N/A    X3  . LITHOTRIPSY     Hx; of for kidney stones  . SAVORY DILATION N/A 08/10/2014   Procedure: SAVORY DILATION;  Surgeon: Jeryl Columbia, MD;  [facility] ENDOSCOPY;  Service: Endoscopy;  Laterality: N/A;  . TOTAL HIP ARTHROPLASTY Right 12/30/2017   Procedure: RIGHT TOTAL HIP ARTHROPLASTY;  Surgeon: Carole Civil, MD;  Location: [facility];  Service: Orthopedics;  Laterality: Right;  . TUBAL LIGATION     1975    BP (!) 149/91   Pulse 90   Ht 5\' 5"  (1.651 m)   Wt 88 lb (39.9 kg)   BMI 14.64 kg/m  Physical Exam  Constitutional: She is oriented to person, place, and time. She appears well-developed and well-nourished.  Neurological: She is alert and oriented to person, place, and time.  Psychiatric: She has a normal mood and affect. Judgment normal.  Vitals reviewed.  Right hip inspection normal range of motion excellent stability normal strength normal skin normal pulse normal sensation normal  Left hip she cannot flex her hip without assistance however with range of motion the hip is pain-free limb alignment is normal with no shortening hip is  otherwise stable muscle tone and strength are normal skin is normal pulses normal lymph nodes are negative sensation is normal  Encounter Diagnoses  Name Primary?  . S/P hip replacement, right 12/30/17   . Pain of left hip joint   . Closed fracture of neck of left femur, initial encounter ([place]) Yes    Right total hip doing well no further instructions  Left hip fracture acute/subacute recommend left total hip  The procedure has been fully reviewed with the patient; The risks and benefits of surgery have been discussed and explained and understood. Alternative treatment has also been reviewed, questions were encouraged and answered. The postoperative plan is also been reviewed.

## 2018-02-14 NOTE — Patient Instructions (Signed)
  We will perform the procedure commonly known as total hip replacement. Some of the risks associated with total hip replacement include but are not limited to Bleeding Infection Swelling Stiffness Blood clot Continued Pain Dislocation LEG LENGTH INEQUALITY REQUIRING A SHOE LIFT Persistent limp  Infection is a devastating complication of joint replacement surgery. If you get an infection the implant usually will have to be removed and several surgeries and antibiotics will be needed to eradicate the infection. In some rare cases the hip cannot be reconstructed with another implant. This will lead to permanent need for crutches and assistive devices to ambulate.   In compliance with recent [place] law in federal regulation regarding opioid use and abuse and addiction, we will taper (stop) opioid medication after 6 weeks.   If you're not comfortable with these risks and would like to continue with nonoperative treatment please let Dr. Aline Brochure know prior to your surgery.

## 2018-02-14 NOTE — H&P (Signed)
Chief Complaint  Patient presents with  . Post-op Follow-up    12/30/17 right THR     67 year old female had a right total hip for right  hip fracture; 6 weeks ago.  She presents now with left hip pain groin pain anterior thigh pain  Exam is remarkable for painless range of motion of the hip full range of motion of the left hip  She is still using her walker.  X-ray shows: Left hip fracture  Chief complaint pain left hip  History 67 years old status post right total hip on May 30 for right hip fracture presents with left hip pain had an x-ray June 18 which was normal.  She presents now with continuing and worsening left hip pain when she is walking no real pain at rest seems to be worse when she gets up from sitting pain is mild she is still requiring the use of her walker and says her right hip feels fine.  Review of systems weight loss of 10 or 11 pounds  Review of Systems  All other systems reviewed and are negative.  Past Medical History:  Diagnosis Date  . Anemia    iron   . Anxiety   . Diverticulitis    Hx: of  . GERD (gastroesophageal reflux disease)    at times  . History of kidney stones    10 years ago  . Hypothyroidism   . Low iron   . Pneumonia   . Rosacea    Hx: of   Past Surgical History:  Procedure Laterality Date  . APPENDECTOMY    . BALLOON DILATION  06/16/2011   Procedure: BALLOON DILATION;  Surgeon: Jeryl Columbia, MD;  Location: [facility] ENDOSCOPY;  Service: Endoscopy;  Laterality: N/A;  . BALLOON DILATION N/A 10/03/2013   Procedure: BALLOON DILATION;  Surgeon: Jeryl Columbia, MD;  [facility]OS[facility];  Service: Endoscopy;  Laterality: N/A;  . BALLOON DILATION N/A 08/10/2014   Procedure: BALLOON DILATION;  Surgeon: Jeryl Columbia, MD;  [facility] ENDOSCOPY;  Service: Endoscopy;  Laterality: N/A;  from 12 - 15 cm dilation completed  . BALLOON DILATION N/A 02/15/2015   Procedure: BALLOON DILATION;  Surgeon: Clarene Essex, MD;  [facility]OS[facility];  Service:  Endoscopy;  Laterality: N/A;  . BALLOON DILATION N/A 11/12/2015   Procedure: BALLOON DILATION;  Surgeon: Clarene Essex, MD;  [facility];  Service: Endoscopy;  Laterality: N/A;  . BALLOON DILATION N/A 08/10/2016   Procedure: BALLOON DILATION;  Surgeon: Clarene Essex, MD;  Location: [facility] ENDOSCOPY;  Service: Endoscopy;  Laterality: N/A;  . BALLOON DILATION N/A 08/07/2016   Procedure: BALLOON DILATION;  Surgeon: Clarene Essex, MD;  Location: [facility] ENDOSCOPY;  Service: Endoscopy;  Laterality: N/A;  . BALLOON DILATION N/A 05/20/2017   Procedure: BALLOON DILATION;  Surgeon: Clarene Essex, MD;  [facility];  Service: Endoscopy;  Laterality: N/A;  . BALLOON DILATION N/A 12/02/2017   Procedure: BALLOON DILATION;  Surgeon: Clarene Essex, MD;  Location: [place];  Service: Endoscopy;  Laterality: N/A;  . CHOLECYSTECTOMY OPEN  1979  . COLONOSCOPY     Hx: of  . ESOPHAGOGASTRODUODENOSCOPY  06/16/2011   Procedure: ESOPHAGOGASTRODUODENOSCOPY (EGD);  Surgeon: Jeryl Columbia, MD;  Location: [facility] ENDOSCOPY;  Service: Endoscopy;  Laterality: N/A;  . ESOPHAGOGASTRODUODENOSCOPY N/A 10/03/2013   Procedure: ESOPHAGOGASTRODUODENOSCOPY (EGD);  Surgeon: Jeryl Columbia, MD;  Location: [facility] ENDOSCOPY;  Service: Endoscopy;  Laterality: N/A;  . ESOPHAGOGASTRODUODENOSCOPY N/A 08/10/2014   Procedure: ESOPHAGOGASTRODUODENOSCOPY (EGD);  Surgeon: Altamese Dilling  Drema Balzarine, MD;  [facility];  Service: Endoscopy;  Laterality: N/A;  . ESOPHAGOGASTRODUODENOSCOPY N/A 08/10/2016   Procedure: ESOPHAGOGASTRODUODENOSCOPY (EGD);  Surgeon: Clarene Essex, MD;  Location: [facility] ENDOSCOPY;  Service: Endoscopy;  Laterality: N/A;  . ESOPHAGOGASTRODUODENOSCOPY (EGD) WITH PROPOFOL N/A 02/15/2015   Procedure: ESOPHAGOGASTRODUODENOSCOPY (EGD) WITH PROPOFOL;  Surgeon: Clarene Essex, MD;  [facility];  Service: Endoscopy;  Laterality: N/A;  . ESOPHAGOGASTRODUODENOSCOPY (EGD) WITH PROPOFOL N/A 11/12/2015   Procedure: ESOPHAGOGASTRODUODENOSCOPY (EGD) WITH PROPOFOL;   Surgeon: Clarene Essex, MD;  [facility];  Service: Endoscopy;  Laterality: N/A;  . ESOPHAGOGASTRODUODENOSCOPY (EGD) WITH PROPOFOL N/A 08/07/2016   Procedure: ESOPHAGOGASTRODUODENOSCOPY (EGD) WITH PROPOFOL;  Surgeon: Clarene Essex, MD;  Location: [facility] ENDOSCOPY;  Service: Endoscopy;  Laterality: N/A;  . ESOPHAGOGASTRODUODENOSCOPY (EGD) WITH PROPOFOL N/A 05/20/2017   Procedure: ESOPHAGOGASTRODUODENOSCOPY (EGD) WITH PROPOFOL;  Surgeon: Clarene Essex, MD;  [facility];  Service: Endoscopy;  Laterality: N/A;  . ESOPHAGOGASTRODUODENOSCOPY (EGD) WITH PROPOFOL N/A 12/02/2017   Procedure: ESOPHAGOGASTRODUODENOSCOPY (EGD) WITH PROPOFOL;  Surgeon: Clarene Essex, MD;  Location: [place];  Service: Endoscopy;  Laterality: N/A;  have c arm available  . FRACTURE SURGERY     Hx: of left wrist  . GASTRECTOMY  [phone]  . GASTRECTOMY N/A    X3  . LITHOTRIPSY     Hx; of for kidney stones  . SAVORY DILATION N/A 08/10/2014   Procedure: SAVORY DILATION;  Surgeon: Jeryl Columbia, MD;  [facility] ENDOS[facility];  Service: Endoscopy;  Laterality: N/A;  . TOTAL HIP ARTHROPLASTY Right 12/30/2017   Procedure: RIGHT TOTAL HIP ARTHROPLASTY;  Surgeon: Carole Civil, MD;  [facility];  Service: Orthopedics;  Laterality: Right;  . TUBAL LIGATION     1975    BP (!) 149/91   Pulse 90   Ht 5\' 5"  (1.651 m)   Wt 88 lb (39.9 kg)   BMI 14.64 kg/m  Physical Exam  Constitutional: She is oriented to person, place, and time. She appears well-developed and well-nourished.  Neurological: She is alert and oriented to person, place, and time.  Psychiatric: She has a normal mood and affect. Judgment normal.  Vitals reviewed.  Right hip inspection normal range of motion excellent stability normal strength normal skin normal pulse normal sensation normal  Left hip she cannot flex her hip without assistance however with range of motion the hip is pain-free limb alignment is normal with no shortening hip is  otherwise stable muscle tone and strength are normal skin is normal pulses normal lymph nodes are negative sensation is normal  Encounter Diagnoses  Name Primary?  . S/P hip replacement, right 12/30/17   . Pain of left hip joint   . Closed fracture of neck of left femur, initial encounter ([place]) Yes    Right total hip doing well no further instructions  Left hip fracture acute/subacute recommend left total hip  The procedure has been fully reviewed with the patient; The risks and benefits of surgery have been discussed and explained and understood. Alternative treatment has also been reviewed, questions were encouraged and answered. The postoperative plan is also been reviewed.

## 2018-02-15 ENCOUNTER — Inpatient Hospital Stay: Admit: 2018-02-15 | Admitting: Orthopedic Surgery

## 2018-02-15 ENCOUNTER — Other Ambulatory Visit: Payer: Self-pay

## 2018-02-15 ENCOUNTER — Encounter (HOSPITAL_COMMUNITY): Payer: Self-pay

## 2018-02-15 LAB — BASIC METABOLIC PANEL
Anion gap: 5 (ref 5–15)
BUN: 24 mg/dL — ABNORMAL HIGH (ref 8–23)
CALCIUM: 7.7 mg/dL — AB (ref 8.9–10.3)
CO2: 19 mmol/L — AB (ref 22–32)
CREATININE: 0.92 mg/dL (ref 0.44–1.00)
Chloride: 122 mmol/L — ABNORMAL HIGH (ref 98–111)
GFR calc non Af Amer: >60 mL/min (ref >60)
Glucose, Bld: 94 mg/dL (ref 70–99)
Potassium: 2.1 mmol/L — CL (ref 3.5–5.1)
SODIUM: 146 mmol/L — AB (ref 135–145)

## 2018-02-15 LAB — SURGICAL PCR SCREEN
MRSA, PCR: POSITIVE — AB
STAPHYLOCOCCUS AUREUS: POSITIVE — AB

## 2018-02-15 LAB — HIV ANTIBODY (ROUTINE TESTING W REFLEX): HIV Screen 4th Generation wRfx: NONREACTIVE

## 2018-02-15 MED ORDER — POTASSIUM CHLORIDE CRYS ER 20 MEQ PO TBCR
30.0000 meq | EXTENDED_RELEASE_TABLET | Freq: Three times a day (TID) | ORAL | Status: DC
  Administered 2018-02-15 – 2018-02-16 (×6): 30 meq via ORAL
  Filled 2018-02-15 (×6): qty 1

## 2018-02-15 MED ORDER — MUPIROCIN 2 % EX OINT
1.0000 "application " | TOPICAL_OINTMENT | Freq: Two times a day (BID) | CUTANEOUS | Status: AC
  Administered 2018-02-15 – 2018-02-16 (×4): 1 via NASAL
  Filled 2018-02-15: qty 22

## 2018-02-15 NOTE — Progress Notes (Signed)
Received call from lab regarding obtaining requested units of blood for OR,one unit available d/t antibodies,will not be able to obtain  remainder units that will be arriving from [place] until tomorrow.

## 2018-02-15 NOTE — Progress Notes (Signed)
Subjective: Feels ok    Objective: Vital signs in last 24 hours: Temp:  [97.6 F (36.4 C)-98.9 F (37.2 C)] 98.9 F (37.2 C) (07/16 1302) Pulse Rate:  [74-82] 82 (07/16 1302) Resp:  [16-20] 16 (07/16 1302) BP: (102-109)/(68-69) 105/69 (07/16 1302) SpO2:  [98 %-100 %] 98 % (07/16 1932)  Intake/Output from previous day: 07/15 0701 - 07/16 0700 In: 907.5 [P.O.:240; I.V.:667.5] Out: -  Intake/Output this shift: No intake/output data recorded.  Recent Labs    02/14/18 1655  HGB 10.0*   Recent Labs    02/14/18 1655  WBC 5.3  RBC 3.14*  HCT 32.3*  PLT 273   Recent Labs    02/14/18 1655 02/15/18 0438  NA 140 146*  K <2.0* 2.1*  CL 117* 122*  CO2 17* 19*  BUN 30* 24*  CREATININE 1.09* 0.92  GLUCOSE 124* 94  CALCIUM 7.5* 7.7*   Recent Labs    02/14/18 1812  INR 0.99    Neurologically intact ABD soft Neurovascular intact Sensation intact distally Intact pulses distally Dorsiflexion/Plantar flexion intact      Assessment/Plan: Hip fracture - left Hypokalemia  Blood antibody req waiting for blood from [place]  Increase potassium slowly    [name] 02/15/2018, 9:38 PM

## 2018-02-16 ENCOUNTER — Telehealth: Payer: Self-pay

## 2018-02-16 LAB — BASIC METABOLIC PANEL
Anion gap: 6 (ref 5–15)
BUN: 15 mg/dL (ref 8–23)
CHLORIDE: 122 mmol/L — AB (ref 98–111)
CO2: 18 mmol/L — AB (ref 22–32)
Calcium: 7.6 mg/dL — ABNORMAL LOW (ref 8.9–10.3)
Creatinine, Ser: 0.78 mg/dL (ref 0.44–1.00)
GFR calc Af Amer: >60 mL/min (ref >60)
Glucose, Bld: 89 mg/dL (ref 70–99)
POTASSIUM: 3.2 mmol/L — AB (ref 3.5–5.1)
SODIUM: 146 mmol/L — AB (ref 135–145)

## 2018-02-16 MED ORDER — CHLORHEXIDINE GLUCONATE CLOTH 2 % EX PADS
6.0000 | MEDICATED_PAD | Freq: Every day | CUTANEOUS | Status: AC
  Administered 2018-02-16 – 2018-02-20 (×5): 6 via TOPICAL

## 2018-02-16 MED ORDER — HEPARIN SODIUM (PORCINE) 5000 UNIT/ML IJ SOLN
5000.0000 [IU] | Freq: Once | INTRAMUSCULAR | Status: AC
  Administered 2018-02-16: 5000 [IU] via SUBCUTANEOUS
  Filled 2018-02-16: qty 1

## 2018-02-16 MED ORDER — VANCOMYCIN HCL IN DEXTROSE 1-5 GM/200ML-% IV SOLN
1000.0000 mg | INTRAVENOUS | Status: AC
  Administered 2018-02-17: 1000 mg via INTRAVENOUS
  Filled 2018-02-16: qty 200

## 2018-02-16 NOTE — Progress Notes (Signed)
Patient ID: PRAPTI GRUSSING, female   DOB: 1950/08/15, 67 y.o.   MRN: [id number]  Hospital day 2, left hip fracture, status post right hip total hip right hip fracture Hypokalemia Blood antibodies preventing type and cross for adequate units for surgery  BP 114/66 (BP Location: Right Arm)   Pulse 79   Temp 98.3 F (36.8 C) (Oral)   Resp 20   Ht 5\' 5"  (1.651 m)   Wt 88 lb (39.9 kg)   SpO2 100%   BMI 14.64 kg/m   Patient has pain when she gets out of bed however, she is otherwise okay  CBC Latest Ref Rng & Units 02/14/2018 01/02/2018 01/01/2018  WBC 4.0 - 10.5 K/uL 5.3 7.1 -  Hemoglobin 12.0 - 15.0 g/dL 10.0(L) 10.3(L) 10.1(L)  Hematocrit 36.0 - 46.0 % 32.3(L) 32.9(L) 32.2(L)  Platelets 150 - 400 K/uL 273 294 -    BMP Latest Ref Rng & Units 02/16/2018 02/15/2018 02/14/2018  Glucose 70 - 99 mg/dL 89 94 124(H)  BUN 8 - 23 mg/dL 15 24(H) 30(H)  Creatinine 0.44 - 1.00 mg/dL 0.78 0.92 1.09(H)  BUN/Creat Ratio 12 - 28 - - -  Sodium 135 - 145 mmol/L 146(H) 146(H) 140  Potassium 3.5 - 5.1 mmol/L 3.2(L) 2.1(LL) <2.0(LL)  Chloride 98 - 111 mmol/L 122(H) 122(H) 117(H)  CO2 22 - 32 mmol/L 18(L) 19(L) 17(L)  Calcium 8.9 - 10.3 mg/dL 7.6(L) 7.7(L) 7.5(L)    Patient's potassium is coming up well with oral potassium we will continue  The blood bank says we should have the second unit of blood available today coming from [place]  Patient is scheduled for surgery tomorrow morning  Repeat EKG

## 2018-02-16 NOTE — H&P (View-Only) (Signed)
Patient ID: Brittany Hensley, female   DOB: [birth year], 67 y.o.   MRN: [id number]  Hospital day 2, left hip fracture, status post right hip total hip right hip fracture Hypokalemia Blood antibodies preventing type and cross for adequate units for surgery  BP 114/66 (BP Location: Right Arm)   Pulse 79   Temp 98.3 F (36.8 C) (Oral)   Resp 20   Ht 5\' 5"  (1.651 m)   Wt 88 lb (39.9 kg)   SpO2 100%   BMI 14.64 kg/m   Patient has pain when she gets out of bed however, she is otherwise okay  CBC Latest Ref Rng & Units 02/14/2018 01/02/2018 01/01/2018  WBC 4.0 - 10.5 K/uL 5.3 7.1 -  Hemoglobin 12.0 - 15.0 g/dL 10.0(L) 10.3(L) 10.1(L)  Hematocrit 36.0 - 46.0 % 32.3(L) 32.9(L) 32.2(L)  Platelets 150 - 400 K/uL 273 294 -    BMP Latest Ref Rng & Units 02/16/2018 02/15/2018 02/14/2018  Glucose 70 - 99 mg/dL 89 94 124(H)  BUN 8 - 23 mg/dL 15 24(H) 30(H)  Creatinine 0.44 - 1.00 mg/dL 0.78 0.92 1.09(H)  BUN/Creat Ratio 12 - 28 - - -  Sodium 135 - 145 mmol/L 146(H) 146(H) 140  Potassium 3.5 - 5.1 mmol/L 3.2(L) 2.1(LL) <2.0(LL)  Chloride 98 - 111 mmol/L 122(H) 122(H) 117(H)  CO2 22 - 32 mmol/L 18(L) 19(L) 17(L)  Calcium 8.9 - 10.3 mg/dL 7.6(L) 7.7(L) 7.5(L)    Patient's potassium is coming up well with oral potassium we will continue  The blood bank says we should have the second unit of blood available today coming from [place]  Patient is scheduled for surgery tomorrow morning  Repeat EKG

## 2018-02-16 NOTE — Telephone Encounter (Signed)
Call from pt to request that appt for next week be cancelled due to upcoming hip surgery. Surgery at [facility] tomorrow morning. Pt stated she will be going to a nursing home for rehab following surgery. Pt will call to reschedule appt when able. Will make MD aware

## 2018-02-17 ENCOUNTER — Inpatient Hospital Stay (HOSPITAL_COMMUNITY): Payer: PPO

## 2018-02-17 ENCOUNTER — Inpatient Hospital Stay (HOSPITAL_COMMUNITY): Payer: PPO | Admitting: Anesthesiology

## 2018-02-17 ENCOUNTER — Encounter (HOSPITAL_COMMUNITY)
Admission: AD | Disposition: A | Discharge status: Skilled Nursing Facility | Payer: Self-pay | Source: Ambulatory Visit | Attending: Orthopedic Surgery

## 2018-02-17 ENCOUNTER — Other Ambulatory Visit: Payer: Self-pay

## 2018-02-17 ENCOUNTER — Encounter (HOSPITAL_COMMUNITY): Payer: Self-pay | Admitting: *Deleted

## 2018-02-17 ENCOUNTER — Other Ambulatory Visit: Payer: Self-pay | Admitting: Orthopedic Surgery

## 2018-02-17 DIAGNOSIS — Z96642 Presence of left artificial hip joint: Secondary | ICD-10-CM

## 2018-02-17 DIAGNOSIS — S72002A Fracture of unspecified part of neck of left femur, initial encounter for closed fracture: Secondary | ICD-10-CM

## 2018-02-17 HISTORY — PX: TOTAL HIP ARTHROPLASTY: SHX124

## 2018-02-17 LAB — CBC WITH DIFFERENTIAL/PLATELET
BASOS PCT: 0 %
Basophils Absolute: 0 10*3/uL (ref 0.0–0.1)
Eosinophils Absolute: 0.1 10*3/uL (ref 0.0–0.7)
Eosinophils Relative: 2 %
HEMATOCRIT: 36 % (ref 36.0–46.0)
HEMOGLOBIN: 10.7 g/dL — AB (ref 12.0–15.0)
Lymphocytes Relative: 21 %
Lymphs Abs: 1 10*3/uL (ref 0.7–4.0)
MCH: 31.9 pg (ref 26.0–34.0)
MCHC: 29.7 g/dL — AB (ref 30.0–36.0)
MCV: 107.5 fL — ABNORMAL HIGH (ref 78.0–100.0)
MONOS PCT: 13 %
Monocytes Absolute: 0.6 10*3/uL (ref 0.1–1.0)
NEUTROS ABS: 3 10*3/uL (ref 1.7–7.7)
NEUTROS PCT: 64 %
Platelets: 247 10*3/uL (ref 150–400)
RBC: 3.35 MIL/uL — AB (ref 3.87–5.11)
RDW: 20 % — ABNORMAL HIGH (ref 11.5–15.5)
WBC: 4.7 10*3/uL (ref 4.0–10.5)

## 2018-02-17 LAB — BASIC METABOLIC PANEL
Anion gap: 4 — ABNORMAL LOW (ref 5–15)
BUN: 12 mg/dL (ref 8–23)
CALCIUM: 7.7 mg/dL — AB (ref 8.9–10.3)
CO2: 21 mmol/L — ABNORMAL LOW (ref 22–32)
Chloride: 121 mmol/L — ABNORMAL HIGH (ref 98–111)
Creatinine, Ser: 0.69 mg/dL (ref 0.44–1.00)
GFR calc Af Amer: >60 mL/min (ref >60)
GFR calc non Af Amer: >60 mL/min (ref >60)
Glucose, Bld: 90 mg/dL (ref 70–99)
POTASSIUM: 3.5 mmol/L (ref 3.5–5.1)
Sodium: 146 mmol/L — ABNORMAL HIGH (ref 135–145)

## 2018-02-17 SURGERY — ARTHROPLASTY, HIP, TOTAL,POSTERIOR APPROACH
Anesthesia: General | Site: Hip | Laterality: Left

## 2018-02-17 MED ORDER — DEXAMETHASONE SODIUM PHOSPHATE 4 MG/ML IJ SOLN
INTRAMUSCULAR | Status: AC
  Filled 2018-02-17: qty 1

## 2018-02-17 MED ORDER — PROPOFOL 10 MG/ML IV BOLUS
INTRAVENOUS | Status: AC
  Filled 2018-02-17: qty 20

## 2018-02-17 MED ORDER — MIDAZOLAM HCL 5 MG/5ML IJ SOLN
INTRAMUSCULAR | Status: DC | PRN
  Administered 2018-02-17: 2 mg via INTRAVENOUS

## 2018-02-17 MED ORDER — ROCURONIUM BROMIDE 50 MG/5ML IV SOLN
INTRAVENOUS | Status: AC
  Filled 2018-02-17: qty 1

## 2018-02-17 MED ORDER — LACTATED RINGERS IV SOLN
INTRAVENOUS | Status: DC | PRN
  Administered 2018-02-17: 08:00:00 via INTRAVENOUS

## 2018-02-17 MED ORDER — MIDAZOLAM HCL 2 MG/2ML IJ SOLN: 0.5000 mg | Freq: Once | INTRAMUSCULAR | Status: DC | PRN

## 2018-02-17 MED ORDER — FENTANYL CITRATE (PF) 100 MCG/2ML IJ SOLN
25.0000 ug | INTRAMUSCULAR | Status: DC | PRN
Start: 2018-02-17 — End: 2018-02-17

## 2018-02-17 MED ORDER — LACTATED RINGERS IV SOLN
INTRAVENOUS | Status: DC
  Administered 2018-02-17: 08:00:00 via INTRAVENOUS

## 2018-02-17 MED ORDER — BUPIVACAINE-EPINEPHRINE (PF) 0.5% -1:200000 IJ SOLN
INTRAMUSCULAR | Status: AC
  Filled 2018-02-17: qty 30

## 2018-02-17 MED ORDER — BUPIVACAINE-EPINEPHRINE (PF) 0.5% -1:200000 IJ SOLN
INTRAMUSCULAR | Status: DC | PRN
  Administered 2018-02-17: 20 mL via PERINEURAL

## 2018-02-17 MED ORDER — BUPIVACAINE LIPOSOME 1.3 % IJ SUSP
INTRAMUSCULAR | Status: AC
Start: 2018-02-17 — End: ?
  Filled 2018-02-17: qty 20

## 2018-02-17 MED ORDER — PHENYLEPHRINE HCL 10 MG/ML IJ SOLN
INTRAMUSCULAR | Status: DC | PRN
  Administered 2018-02-17: 80 ug via INTRAVENOUS
  Administered 2018-02-17 (×2): 40 ug via INTRAVENOUS
  Administered 2018-02-17 (×2): 80 ug via INTRAVENOUS
  Administered 2018-02-17 (×2): 40 ug via INTRAVENOUS

## 2018-02-17 MED ORDER — HYDROCODONE-ACETAMINOPHEN 7.5-325 MG PO TABS: 1.0000 | ORAL_TABLET | Freq: Once | ORAL | Status: DC | PRN

## 2018-02-17 MED ORDER — PROMETHAZINE HCL 25 MG/ML IJ SOLN
6.2500 mg | INTRAMUSCULAR | Status: DC | PRN
  Administered 2018-02-17: 6.25 mg via INTRAVENOUS
  Filled 2018-02-17: qty 1

## 2018-02-17 MED ORDER — SODIUM CHLORIDE 0.9 % IJ SOLN
INTRAMUSCULAR | Status: AC
  Filled 2018-02-17: qty 40

## 2018-02-17 MED ORDER — MIDAZOLAM HCL 2 MG/2ML IJ SOLN
INTRAMUSCULAR | Status: AC
  Filled 2018-02-17: qty 2

## 2018-02-17 MED ORDER — ASPIRIN EC 325 MG PO TBEC
325.0000 mg | DELAYED_RELEASE_TABLET | Freq: Two times a day (BID) | ORAL | Status: DC
  Administered 2018-02-17 – 2018-02-21 (×9): 325 mg via ORAL
  Filled 2018-02-17 (×9): qty 1

## 2018-02-17 MED ORDER — DEXAMETHASONE SODIUM PHOSPHATE 4 MG/ML IJ SOLN
INTRAMUSCULAR | Status: DC | PRN
  Administered 2018-02-17: 4 mg via INTRAVENOUS

## 2018-02-17 MED ORDER — FENTANYL CITRATE (PF) 100 MCG/2ML IJ SOLN
INTRAMUSCULAR | Status: AC
  Filled 2018-02-17: qty 2

## 2018-02-17 MED ORDER — SODIUM CHLORIDE 0.9 % IR SOLN
Status: DC | PRN
  Administered 2018-02-17: 1000 mL

## 2018-02-17 MED ORDER — SUGAMMADEX SODIUM 200 MG/2ML IV SOLN
INTRAVENOUS | Status: DC | PRN
  Administered 2018-02-17: 100 mg via INTRAVENOUS

## 2018-02-17 MED ORDER — SODIUM CHLORIDE 0.9 % IV SOLN
INTRAVENOUS | Status: DC | PRN
  Administered 2018-02-17: 60 mL

## 2018-02-17 MED ORDER — HEMOSTATIC AGENTS (NO CHARGE) OPTIME
TOPICAL | Status: DC | PRN
  Administered 2018-02-17: 1 via TOPICAL

## 2018-02-17 MED ORDER — ONDANSETRON HCL 4 MG/2ML IJ SOLN
INTRAMUSCULAR | Status: AC
  Filled 2018-02-17: qty 2

## 2018-02-17 MED ORDER — MEPERIDINE HCL 50 MG/ML IJ SOLN: 6.2500 mg | INTRAMUSCULAR | Status: DC | PRN

## 2018-02-17 MED ORDER — HYDROCODONE-ACETAMINOPHEN 5-325 MG PO TABS
1.0000 | ORAL_TABLET | ORAL | Status: DC | PRN
  Administered 2018-02-17 – 2018-02-21 (×15): 1 via ORAL
  Filled 2018-02-17 (×16): qty 1

## 2018-02-17 MED ORDER — PROPOFOL 10 MG/ML IV BOLUS
INTRAVENOUS | Status: DC | PRN
  Administered 2018-02-17: 120 mg via INTRAVENOUS

## 2018-02-17 MED ORDER — FENTANYL CITRATE (PF) 100 MCG/2ML IJ SOLN
INTRAMUSCULAR | Status: DC | PRN
  Administered 2018-02-17 (×3): 25 ug via INTRAVENOUS

## 2018-02-17 MED ORDER — ROCURONIUM BROMIDE 100 MG/10ML IV SOLN
INTRAVENOUS | Status: DC | PRN
  Administered 2018-02-17: 10 mg via INTRAVENOUS
  Administered 2018-02-17: 40 mg via INTRAVENOUS
  Administered 2018-02-17: 10 mg via INTRAVENOUS

## 2018-02-17 MED ORDER — ONDANSETRON HCL 4 MG/2ML IJ SOLN
INTRAMUSCULAR | Status: AC
Start: 2018-02-17 — End: ?
  Filled 2018-02-17: qty 2

## 2018-02-17 MED ORDER — PHENYLEPHRINE 40 MCG/ML (10ML) SYRINGE FOR IV PUSH (FOR BLOOD PRESSURE SUPPORT)
PREFILLED_SYRINGE | INTRAVENOUS | Status: AC
  Filled 2018-02-17: qty 10

## 2018-02-17 MED ORDER — ONDANSETRON HCL 4 MG/2ML IJ SOLN
INTRAMUSCULAR | Status: DC | PRN
  Administered 2018-02-17: 4 mg via INTRAVENOUS

## 2018-02-17 MED ORDER — MORPHINE SULFATE (PF) 2 MG/ML IV SOLN
2.0000 mg | INTRAVENOUS | Status: DC | PRN
  Administered 2018-02-17 – 2018-02-20 (×6): 2 mg via INTRAVENOUS
  Filled 2018-02-17 (×6): qty 1

## 2018-02-17 MED ORDER — ONDANSETRON HCL 4 MG/2ML IJ SOLN
4.0000 mg | Freq: Four times a day (QID) | INTRAMUSCULAR | Status: DC | PRN
  Administered 2018-02-17 – 2018-02-20 (×7): 4 mg via INTRAVENOUS
  Filled 2018-02-17 (×3): qty 2

## 2018-02-17 SURGICAL SUPPLY — 64 items
BIT DRILL 2.8X128 (BIT) ×2 IMPLANT
BIT DRILL 2.8X128MM (BIT) ×1
BLADE HEX COATED 2.75 (ELECTRODE) ×3 IMPLANT
BLADE SAGITTAL 25.0X1.27X90 (BLADE) ×2 IMPLANT
BLADE SAGITTAL 25.0X1.27X90MM (BLADE) ×1
CAPT HIP TOTAL 2 ×2 IMPLANT
CLOTH BEACON ORANGE TIMEOUT ST (SAFETY) ×3 IMPLANT
COVER LIGHT HANDLE STERIS (MISCELLANEOUS) ×6 IMPLANT
COVER PROBE W GEL 5X96 (DRAPES) ×3 IMPLANT
DECANTER SPIKE VIAL GLASS SM (MISCELLANEOUS) ×4 IMPLANT
DRAPE BACK TABLE (DRAPES) ×3 IMPLANT
DRAPE HIP W/POCKET STRL (DRAPE) ×3 IMPLANT
DRAPE INCISE IOBAN 44X35 STRL (DRAPES) ×3 IMPLANT
DRSG MEPILEX BORDER 4X12 (GAUZE/BANDAGES/DRESSINGS) ×3 IMPLANT
DURAPREP 26ML APPLICATOR (WOUND CARE) ×6 IMPLANT
ELECT REM PT RETURN 9FT ADLT (ELECTROSURGICAL) ×3
ELECTRODE REM PT RTRN 9FT ADLT (ELECTROSURGICAL) ×1 IMPLANT
EVACUATOR 3/16  PVC DRAIN (DRAIN)
EVACUATOR 3/16 PVC DRAIN (DRAIN) IMPLANT
GLOVE BIO SURGEON STRL SZ7 (GLOVE) ×4 IMPLANT
GLOVE BIOGEL PI IND STRL 7.0 (GLOVE) ×1 IMPLANT
GLOVE BIOGEL PI INDICATOR 7.0 (GLOVE) ×8
GLOVE SKINSENSE NS SZ8.0 LF (GLOVE) ×4
GLOVE SKINSENSE STRL SZ8.0 LF (GLOVE) ×2 IMPLANT
GLOVE SS N UNI LF 8.5 STRL (GLOVE) ×3 IMPLANT
GOWN STRL REUS W/TWL LRG LVL3 (GOWN DISPOSABLE) ×7 IMPLANT
GOWN STRL REUS W/TWL XL LVL3 (GOWN DISPOSABLE) ×3 IMPLANT
HANDPIECE INTERPULSE COAX TIP (DISPOSABLE) ×3
HEMOSTAT SURGICEL 4X8 (HEMOSTASIS) ×2 IMPLANT
HOOD W/PEELAWAY (MISCELLANEOUS) ×10 IMPLANT
INST SET MAJOR BONE (KITS) ×3 IMPLANT
IV NS IRRIG 3000ML ARTHROMATIC (IV SOLUTION) ×3 IMPLANT
KIT BLADEGUARD II DBL (SET/KITS/TRAYS/PACK) ×3 IMPLANT
KIT TURNOVER KIT A (KITS) ×3 IMPLANT
MANIFOLD NEPTUNE II (INSTRUMENTS) ×3 IMPLANT
MARKER SKIN DUAL TIP RULER LAB (MISCELLANEOUS) ×3 IMPLANT
NDL HYPO 18GX1.5 BLUNT FILL (NEEDLE) ×2 IMPLANT
NDL HYPO 21X1.5 SAFETY (NEEDLE) ×1 IMPLANT
NDL HYPO 25X1 1.5 SAFETY (NEEDLE) ×1 IMPLANT
NEEDLE HYPO 18GX1.5 BLUNT FILL (NEEDLE) ×6 IMPLANT
NEEDLE HYPO 21X1.5 SAFETY (NEEDLE) ×3 IMPLANT
NEEDLE HYPO 25X1 1.5 SAFETY (NEEDLE) ×3 IMPLANT
NS IRRIG 1000ML POUR BTL (IV SOLUTION) ×3 IMPLANT
PACK TOTAL JOINT (CUSTOM PROCEDURE TRAY) ×3 IMPLANT
PAD ARMBOARD 7.5X6 YLW CONV (MISCELLANEOUS) ×3 IMPLANT
PASSER SUT SWANSON 36MM LOOP (INSTRUMENTS) IMPLANT
PIN STMN SNGL STERILE 9X3.6MM (PIN) ×6 IMPLANT
SET BASIN LINEN APH (SET/KITS/TRAYS/PACK) ×3 IMPLANT
SET HNDPC FAN SPRY TIP SCT (DISPOSABLE) ×1 IMPLANT
SPONGE LAP 18X18 X RAY DECT (DISPOSABLE) ×7 IMPLANT
STAPLER VISISTAT 35W (STAPLE) ×3 IMPLANT
SUT BRALON NAB BRD #1 30IN (SUTURE) ×8 IMPLANT
SUT ETHIBOND 5 LR DA (SUTURE) ×3 IMPLANT
SUT MNCRL 0 VIOLET CTX 36 (SUTURE) ×1 IMPLANT
SUT MON AB 2-0 CT1 36 (SUTURE) ×3 IMPLANT
SUT MONOCRYL 0 CTX 36 (SUTURE) ×2
SUT VIC AB 1 CT1 27 (SUTURE) ×9
SUT VIC AB 1 CT1 27XBRD ANTBC (SUTURE) ×2 IMPLANT
SYR 20CC LL (SYRINGE) ×9 IMPLANT
SYR 30ML LL (SYRINGE) ×3 IMPLANT
SYR BULB IRRIGATION 50ML (SYRINGE) ×3 IMPLANT
TOWEL OR 17X26 4PK STRL BLUE (TOWEL DISPOSABLE) ×3 IMPLANT
TRAY FOLEY CATH SILVER 16FR (SET/KITS/TRAYS/PACK) ×3 IMPLANT
YANKAUER SUCT 12FT TUBE ARGYLE (SUCTIONS) ×5 IMPLANT

## 2018-02-17 NOTE — Progress Notes (Signed)
PT Cancellation Note  Patient Details Name: Brittany Hensley MRN: [id number] DOB: 05/18/1951   Cancelled Treatment:    Reason Eval/Treat Not Completed: Fatigue/lethargy limiting ability to participate.  Patient declined therapy in PM secondary to c/o nausea and fatigue - RN aware.  Will check back tommorrow in AM.   3:38 PM, 02/17/18 Lonell Grandchild, MPT Physical Therapist with [facility] [phone] office [phone] mobile phone

## 2018-02-17 NOTE — Brief Op Note (Signed)
02/17/2018  10:05 AM  PATIENT:  Brittany Hensley  67 y.o. female  PRE-OPERATIVE DIAGNOSIS:  left hip fracture, femoral neck  POST-OPERATIVE DIAGNOSIS:  left hip fracture, femoral neck  PROCEDURE:  Procedure(s): TOTAL HIP ARTHROPLASTY (Left) 27130  Implants  Depuy Femoral stem Tri-Lock with scription size 6 high offset Femoral head 32 mm +1 articuleze metal Acetabular shell sector 48 mm gription sector cup Acetabular liner +4 neutral 32 altrx 2 cancellus 6.5 mm bone screws one was 30 mm one was 25 mm  Approach direct lateral  Surgical dictation for left hip replacement  The patient was identified in the preoperative holding area. The site was marked. The chart was reviewed and updated. The patient was taken to the operating room for spinal anesthesia. A Foley catheter was inserted. The patient was placed in lateral decubitus position with the operative site up. Stulberg positioner was used to hold the patient in place and axillary roll was provided as needed.  Standard preoperative antibiotics were given per protocol using SCIP recommendations.  Vancomycin due to positive MRSA testing and allergies  After the sterile prep and drape the timeout was completed. All implants were accounted for x-rays were visible and everyone agreed on the surgical site left hip   The incision was made over the greater trochanter and deepened through subcutaneous tissue. The fascia was exposed and incised in line with the skin incision. The greater trochanteric bursa was excised. Blunt dissection was carried out in line of the fibers of the gluteus medius. The gluteus medius was subperiosteally dissected from the greater trochanter in line with the vastus lateralis and included the gluteus minimus. These structures were tagged and retracted proximally. 2 Steinmann pins were placed in the pelvis as retractors.  The hip capsule was excised and the hip was exposed. The hip was dislocated anteriorly.  Femoral  neck fracture was noted with varus position of the femoral head A cutting guide was used to perform the proximal neck cut. The head was removed.  Femoral preparation The starter hole reamer was passed followed by box osteotome followed by canal finder.  We broached the canal up to a size 6.  Acetabular preparation Soft tissue and bone was resected from the acetabulum to get good visualization of the anterior and posterior columns.  Anterior and posterior retractors were placed   Reaming was initiated with a 44 reamer and progressively this increased up to a size 48.  A 48 cup was placed with 2 screws which were drilled into the safe zone of the acetabulum.  Bone graft was present placed using femoral head prior to seating of the cup  The acetabular shell was placed and checked for security   We then performed trial reduction with a size 6 femur size 48 acetabulum and a size 32  head with 1 neck length.   Leg length was confirmed first followed by shuck test followed by flexion internal rotation test followed by external rotation extension test followed by sleep test.  Once I was satisfied with the reduction and stability the trial components were removed. Drill holes were passed through the greater trochanter and #5 Ethibond suture was passed through the drill holes. The implant was placed. The size 32 x 1 head was placed and the hip was reduced  The hip was taken back through the same test as performed during the trial and I was satisfied with the reduction and stability  The wound was irrigated thoroughly. The abductors were repaired including the  vastus lateralis with #1 Bralon suture. The hip was then abducted and the fascia was closed with #1 Bralon suture.  Skin was closed with 0 Monocryl suture.  Skin staples were used to reapproximate the skin edges  Exparel dilute 20cc was used along with Marcaine with epinephrine   SURGEON:  Surgeon(s) and Role:    * Carole Civil, MD - Primary  PHYSICIAN ASSISTANT:   ASSISTANTS: [name]  ANESTHESIA:   general  EBL:  350 mL   BLOOD ADMINISTERED:none  DRAINS: none   SPECIMEN:  No Specimen  DISPOSITION OF SPECIMEN:  na  COUNTS:  YES  TOURNIQUET:  * No tourniquets in log *  DICTATION: .Dragon Dictation  PLAN OF CARE: Admit to inpatient   PATIENT DISPOSITION:  PACU - hemodynamically stable.   Delay start of Pharmacological VTE agent (>24hrs) due to surgical blood loss or risk of bleeding: not applicable

## 2018-02-17 NOTE — Clinical Social Work Note (Signed)
Clinical Social Work Assessment  Patient Details  Name: Brittany Hensley MRN: [id number] Date of Birth: 1951-01-12  Date of referral:  02/17/18               Reason for consult:  Facility Placement, Discharge Planning                Permission sought to share information with:  Facility Art therapist granted to share information::     Name::        Agency::  [facility]  Relationship::     Contact Information:     Housing/Transportation Living arrangements for the past 2 months:  [place] of Information:  Patient Patient Interpreter Needed:  None Criminal Activity/Legal Involvement Pertinent to Current Situation/Hospitalization:  No - Comment as needed Significant Relationships:  Adult Children, Siblings Lives with:  Self Do you feel safe going back to the place where you live?  Yes Need for family participation in patient care:  No (Coment)  Care giving concerns: Pt needing SNF rehab   Social Worker assessment / plan: Pt is a 67 year old female referred to CSW for SNF rehab placement. Pt has been at [facility] in the past and she would like to go there again for rehab. Per pt, they are already saving a bed over there for her. Will refer and also start insurance authorization.   Employment status:  Retired Nurse, adult PT Recommendations:  [place] / Referral to community resources:  [place]  Patient/Family's Response to care: Pt accepting of care.  Patient/Family's Understanding of and Emotional Response to Diagnosis, Current Treatment, and Prognosis: Pt appears to have a good understanding of diagnosis and treatment recommendations. No emotional distress identified.  Emotional Assessment Appearance:  Appears stated age Attitude/Demeanor/Rapport:  Engaged Affect (typically observed):  Calm, Pleasant Orientation:  Oriented to Self, Oriented to Place,  Oriented to  Time, Oriented to Situation Alcohol / Substance use:  Not Applicable Psych involvement (Current and /or in the community):  No (Comment)  Discharge Needs  Concerns to be addressed:  Discharge Planning Concerns Readmission within the last 30 days:    Current discharge risk:  Lives alone, Physical Impairment Barriers to Discharge:  [place], LCSW 02/17/2018, 2:31 PM

## 2018-02-17 NOTE — Op Note (Signed)
02/17/2018  10:05 AM  PATIENT:  Brittany Hensley  67 y.o. female  PRE-OPERATIVE DIAGNOSIS:  left hip fracture, femoral neck  POST-OPERATIVE DIAGNOSIS:  left hip fracture, femoral neck  PROCEDURE:  Procedure(s): TOTAL HIP ARTHROPLASTY (Left) 27130  Implants  Depuy Femoral stem Tri-Lock with scription size 6 high offset Femoral head 32 mm +1 articuleze metal Acetabular shell sector 48 mm gription sector cup Acetabular liner +4 neutral 32 altrx 2 cancellus 6.5 mm bone screws one was 30 mm one was 25 mm  Approach direct lateral  Surgical dictation for left hip replacement  The patient was identified in the preoperative holding area. The site was marked. The chart was reviewed and updated. The patient was taken to the operating room for spinal anesthesia. A Foley catheter was inserted. The patient was placed in lateral decubitus position with the operative site up. Stulberg positioner was used to hold the patient in place and axillary roll was provided as needed.  Standard preoperative antibiotics were given per protocol using SCIP recommendations.  Vancomycin due to positive MRSA testing and allergies  After the sterile prep and drape the timeout was completed. All implants were accounted for x-rays were visible and everyone agreed on the surgical site left hip   The incision was made over the greater trochanter and deepened through subcutaneous tissue. The fascia was exposed and incised in line with the skin incision. The greater trochanteric bursa was excised. Blunt dissection was carried out in line of the fibers of the gluteus medius. The gluteus medius was subperiosteally dissected from the greater trochanter in line with the vastus lateralis and included the gluteus minimus. These structures were tagged and retracted proximally. 2 Steinmann pins were placed in the pelvis as retractors.  The hip capsule was excised and the hip was exposed. The hip was dislocated anteriorly.  Femoral  neck fracture was noted with varus position of the femoral head A cutting guide was used to perform the proximal neck cut. The head was removed.  Femoral preparation The starter hole reamer was passed followed by box osteotome followed by canal finder.  We broached the canal up to a size 6.  Acetabular preparation Soft tissue and bone was resected from the acetabulum to get good visualization of the anterior and posterior columns.  Anterior and posterior retractors were placed   Reaming was initiated with a 44 reamer and progressively this increased up to a size 48.  A 48 cup was placed with 2 screws which were drilled into the safe zone of the acetabulum.  Bone graft was present placed using femoral head prior to seating of the cup  The acetabular shell was placed and checked for security   We then performed trial reduction with a size 6 femur size 48 acetabulum and a size 32  head with 1 neck length.   Leg length was confirmed first followed by shuck test followed by flexion internal rotation test followed by external rotation extension test followed by sleep test.  Once I was satisfied with the reduction and stability the trial components were removed. Drill holes were passed through the greater trochanter and #5 Ethibond suture was passed through the drill holes. The implant was placed. The size 32 x 1 head was placed and the hip was reduced  The hip was taken back through the same test as performed during the trial and I was satisfied with the reduction and stability  The wound was irrigated thoroughly. The abductors were repaired including the  vastus lateralis with #1 Bralon suture. The hip was then abducted and the fascia was closed with #1 Bralon suture.  Skin was closed with 0 Monocryl suture.  Skin staples were used to reapproximate the skin edges  Exparel dilute 20cc was used along with Marcaine with epinephrine   SURGEON:  Surgeon(s) and Role:    * Carole Civil, MD - Primary  PHYSICIAN ASSISTANT:   ASSISTANTS: [name]  ANESTHESIA:   general  EBL:  350 mL   BLOOD ADMINISTERED:none  DRAINS: none   SPECIMEN:  No Specimen  DISPOSITION OF SPECIMEN:  na  COUNTS:  YES  TOURNIQUET:  * No tourniquets in log *  DICTATION: .Dragon Dictation  PLAN OF CARE: Admit to inpatient   PATIENT DISPOSITION:  PACU - hemodynamically stable.   Delay start of Pharmacological VTE agent (>24hrs) due to surgical blood loss or risk of bleeding: not applicable

## 2018-02-17 NOTE — Anesthesia Postprocedure Evaluation (Signed)
Anesthesia Post Note  Patient: Brittany Hensley  Procedure(s) Performed: TOTAL HIP ARTHROPLASTY (Left Hip)  Patient location during evaluation: PACU Anesthesia Type: General Level of consciousness: awake and patient cooperative Pain management: pain level controlled Vital Signs Assessment: post-procedure vital signs reviewed and stable Respiratory status: spontaneous breathing, nonlabored ventilation and respiratory function stable Cardiovascular status: blood pressure returned to baseline Postop Assessment: no apparent nausea or vomiting Anesthetic complications: no     Last Vitals:  Vitals:   02/17/18 0725 02/17/18 1015  BP: 112/70 115/64  Pulse:  89  Resp: 19 12  Temp:  36.8 C  SpO2: 98% 100%    Last Pain:  Vitals:   02/17/18 1015  TempSrc:   PainSc: 0-No pain                 Olanrewaju Osborn J

## 2018-02-17 NOTE — NC FL2 (Signed)
[place] MEDICAID FL2 LEVEL OF CARE SCREENING TOOL     IDENTIFICATION  Patient Name: Brittany Hensley Birthdate: 05-05-1951 Sex: female Admission Date (Current Location): 02/14/2018  [facility] and [place] Number:  [employer] and Address:  [facility] [address], [place]      Provider Number: [id number]  Attending Physician Name and Address:  Carole Civil, MD  Relative Name and Phone Number:  Carlye Grippe (son) [phone]    Current Level of Care: Hospital Recommended Level of Care: [place] Prior Approval Number: [id number]  Date Approved/Denied: 01/03/18 PASRR Number:    Discharge Plan: SNF    Current Diagnoses: Patient Active Problem List   Diagnosis Date Noted  . Status post total hip replacement, left 02/17/2018  . Closed displaced fracture of left femoral neck ([place]) 02/14/2018  . Left displaced femoral neck fracture ([place]) 02/14/2018  . S/P hip replacement, right 12/30/17 01/13/2018  . Pressure injury of skin 01/01/2018  . Closed displaced fracture of right femoral neck ([place]) 12/30/2017  . Hip fracture, right, closed, initial encounter ([place])   . Femur fracture, right ([place]) 12/29/2017  . Abnormal x-ray of lung 05/21/2017  . BMI less than 19,adult 01/10/2016  . GAD (generalized anxiety disorder) 01/10/2016  . 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  . Hypothyroidism 04/24/2013  . GERD (gastroesophageal reflux disease) 04/24/2013  . Rosacea 04/24/2013  . Iron deficiency anemia 06/09/2012  . Gastric outlet obstruction 06/16/2011  . Atrioventricular nodal re-entry tachycardia ([place]) 03/11/2010  . MURMUR 03/11/2010  . PERSONAL HISTORY OF URINARY CALCULI 02/14/2008    Orientation RESPIRATION BLADDER Height & Weight     Self, Time, Situation, Place  Normal Continent Weight: 88 lb (39.9 kg) Height:  5\' 5"  (165.1 cm)  BEHAVIORAL SYMPTOMS/MOOD NEUROLOGICAL BOWEL NUTRITION STATUS      Continent  Diet(see dc summary)  AMBULATORY STATUS COMMUNICATION OF NEEDS Skin   Extensive Assist Verbally PU Stage and Appropriate Care(coccyx)   PU Stage 2 Dressing: (see dc summary)                   Personal Care Assistance Level of Assistance  Bathing, Feeding, Dressing Bathing Assistance: Limited assistance Feeding assistance: Independent Dressing Assistance: Limited assistance     Functional Limitations Info  Sight, Hearing, Speech Sight Info: Adequate Hearing Info: Adequate Speech Info: Adequate    SPECIAL CARE FACTORS FREQUENCY  PT (By licensed PT)     PT Frequency: 5x/week              Contractures Contractures Info: Not present    Additional Factors Info  Code Status, Allergies Code Status Info: full Allergies Info: Augmentin, Famotidine, Klonopin, Reglan, Nitrofurantoin, Sulfamethoxazole-trimethoprim           Current Medications (02/17/2018):  This is the current hospital active medication list Current Facility-Administered Medications  Medication Dose Route Frequency Provider Last Rate Last Dose  . 0.9 %  sodium chloride infusion   Intravenous Continuous Carole Civil, MD 75 mL/hr at 02/17/18 1133    . acidophilus (RISAQUAD) capsule 1 capsule  1 capsule Oral Daily Carole Civil, MD   1 capsule at 02/16/18 1031  . ALPRAZolam Duanne Moron) tablet 0.5 mg  0.5 mg Oral QID PRN Carole Civil, MD   0.5 mg at 02/17/18 1233  . aspirin EC tablet 325 mg  325 mg Oral BID Carole Civil, MD   325 mg at 02/17/18 1212  . calcium citrate (CALCITRATE - dosed in  mg elemental calcium) tablet 100 mg of elemental calcium  100 mg of elemental calcium Oral BID Carole Civil, MD   100 mg of elemental calcium at 02/16/18 2130  . Chlorhexidine Gluconate Cloth 2 % PADS 6 each  6 each Topical Q0600 Carole Civil, MD   6 each at 02/17/18 0541  . cholecalciferol (VITAMIN D) tablet 1,000 Units  1,000 Units Oral BID Carole Civil, MD   1,000 Units at  02/16/18 2122  . cyclobenzaprine (FLEXERIL) tablet 5 mg  5 mg Oral TID PRN Carole Civil, MD   5 mg at 02/16/18 2123  . docusate sodium (COLACE) capsule 100 mg  100 mg Oral BID Carole Civil, MD   100 mg at 02/16/18 2123  . feeding supplement (ENSURE ENLIVE) (ENSURE ENLIVE) liquid 237 mL  237 mL Oral BID BM Carole Civil, MD      . furosemide (LASIX) tablet 20 mg  20 mg Oral Daily PRN Carole Civil, MD      . HYDROcodone-acetaminophen (NORCO/VICODIN) 5-325 MG per tablet 1 tablet  1 tablet Oral Q4H PRN Carole Civil, MD   1 tablet at 02/17/18 1212  . levothyroxine (SYNTHROID, LEVOTHROID) tablet 100 mcg  100 mcg Oral q morning - 10a Carole Civil, MD   100 mcg at 02/16/18 1031  . loratadine (CLARITIN) tablet 10 mg  10 mg Oral Daily Carole Civil, MD   10 mg at 02/16/18 1032  . magnesium oxide (MAG-OX) tablet 400 mg  400 mg Oral Q lunch Carole Civil, MD   400 mg at 02/17/18 1212  . meperidine (DEMEROL) injection 6.25-12.5 mg  6.25-12.5 mg Intravenous Q5 min PRN Annye Asa, MD      . midazolam (VERSED) injection 0.5-2 mg  0.5-2 mg Intravenous Once PRN Annye Asa, MD      . morphine 2 MG/ML injection 2 mg  2 mg Intravenous Q2H PRN Carole Civil, MD   2 mg at 02/17/18 1431  . multivitamin with minerals tablet 1 tablet  1 tablet Oral BID Carole Civil, MD   1 tablet at 02/16/18 2123  . mupirocin ointment (BACTROBAN) 2 % 1 application  1 application Nasal BID Carole Civil, MD   1 application at 88/91/69 2123  . ondansetron (ZOFRAN) injection 4 mg  4 mg Intravenous Q6H PRN Carole Civil, MD   4 mg at 02/17/18 1036  . pantoprazole (PROTONIX) EC tablet 80 mg  80 mg Oral Daily Carole Civil, MD   80 mg at 02/16/18 1031  . promethazine (PHENERGAN) injection 6.25-12.5 mg  6.25-12.5 mg Intravenous Q15 min PRN Annye Asa, MD   6.25 mg at 02/17/18 1431  . sertraline (ZOLOFT) tablet 200 mg  200 mg Oral Daily Carole Civil, MD   200 mg at 02/16/18 2123  . sucralfate (CARAFATE) tablet 1 g  1 g Oral TID Carole Civil, MD   1 g at 02/16/18 2122  . vitamin C (ASCORBIC ACID) tablet 500 mg  500 mg Oral BID Carole Civil, MD   500 mg at 02/16/18 2123     Discharge Medications: Please see discharge summary for a list of discharge medications.  Relevant Imaging Results:  Relevant Lab Results:   Additional Information SSN: [id number] [address], LCSW

## 2018-02-17 NOTE — Transfer of Care (Signed)
Immediate Anesthesia Transfer of Care Note  Patient: Brittany Hensley  Procedure(s) Performed: TOTAL HIP ARTHROPLASTY (Left Hip)  Patient Location: PACU  Anesthesia Type:General  Level of Consciousness: awake and patient cooperative  Airway & Oxygen Therapy: Patient Spontanous Breathing and Patient connected to nasal cannula oxygen  Post-op Assessment: Report given to RN, Post -op Vital signs reviewed and stable and Patient moving all extremities  Post vital signs: Reviewed and stable  Last Vitals:  Vitals Value Taken Time  BP    Temp    Pulse 89 02/17/2018 10:15 AM  Resp    SpO2 100 % 02/17/2018 10:15 AM  Vitals shown include unvalidated device data.  Last Pain:  Vitals:   02/17/18 0652  TempSrc: Oral  PainSc: 0-No pain      Patients Stated Pain Goal: 4 (51/83/43 7357)  Complications: No apparent anesthesia complications

## 2018-02-17 NOTE — Progress Notes (Signed)
Patient arrived to floor via bed. Patient is alert and oriented and requests to move her left leg. Patient moves left leg to a 90 degree angle with no complaints. Patient offers no complaints at this time. Family at bedside. Will continue to monitor and assess.

## 2018-02-17 NOTE — Anesthesia Procedure Notes (Signed)
Procedure Name: Intubation Date/Time: 02/17/2018 7:55 AM Performed by: Charmaine Downs, CRNA Pre-anesthesia Checklist: Patient identified, Patient being monitored, Timeout performed, Emergency Drugs available and Suction available Patient Re-evaluated:Patient Re-evaluated prior to induction Oxygen Delivery Method: Circle System Utilized Preoxygenation: Pre-oxygenation with 100% oxygen Induction Type: IV induction Ventilation: Mask ventilation without difficulty Laryngoscope Size: Mac and 3 Grade View: Grade I Tube type: Oral Tube size: 6.0 mm Number of attempts: 1 Airway Equipment and Method: stylet Placement Confirmation: ETT inserted through vocal cords under direct vision,  positive ETCO2 and breath sounds checked- equal and bilateral Secured at: 21 cm Tube secured with: Tape Dental Injury: Teeth and Oropharynx as per pre-operative assessment

## 2018-02-17 NOTE — Anesthesia Preprocedure Evaluation (Signed)
Anesthesia Evaluation  Patient identified by MRN, date of birth, ID band Patient awake    Reviewed: Allergy & Precautions, NPO status , Patient's Chart, lab work & pertinent test results  Airway Mallampati: II  TM Distance: >3 FB Neck ROM: Full    Dental no notable dental hx.    Pulmonary neg pulmonary ROS, pneumonia, resolved,    Pulmonary exam normal breath sounds clear to auscultation       Cardiovascular Exercise Tolerance: Poor negative cardio ROS Normal cardiovascular examII Rhythm:Regular Rate:Normal  H/o THA on the L 6 weeks ago - now back for R THA today    Neuro/Psych Anxiety Takes Xanax qd - BID vs TIDnegative neurological ROS  negative psych ROS   GI/Hepatic negative GI ROS, Neg liver ROS, GERD  Medicated and Controlled,Denies Sx today   Endo/Other  negative endocrine ROSHypothyroidism   Renal/GU negative Renal ROS  negative genitourinary   Musculoskeletal negative musculoskeletal ROS (+)   Abdominal   Peds negative pediatric ROS (+)  Hematology negative hematology ROS (+) anemia , ~10/30 - mult Antibodies -pt has units available    Anesthesia Other Findings   Reproductive/Obstetrics negative OB ROS                             Anesthesia Physical Anesthesia Plan  ASA: III  Anesthesia Plan: General   Post-op Pain Management:    Induction: Intravenous  PONV Risk Score and Plan:   Airway Management Planned: Oral ETT  Additional Equipment:   Intra-op Plan:   Post-operative Plan: Extubation in OR  Informed Consent: I have reviewed the patients History and Physical, chart, labs and discussed the procedure including the risks, benefits and alternatives for the proposed anesthesia with the patient or authorized representative who has indicated his/her understanding and acceptance.   Dental advisory given  Plan Discussed with: CRNA  Anesthesia Plan Comments:          Anesthesia Quick Evaluation

## 2018-02-17 NOTE — Interval H&P Note (Signed)
History and Physical Interval Note:  02/17/2018 7:27 AM  Brittany Hensley  has presented today for surgery, with the diagnosis of left hip fracture  The various methods of treatment have been discussed with the patient and family. After consideration of risks, benefits and other options for treatment, the patient has consented to  Procedure(s): TOTAL HIP ARTHROPLASTY (Left) as a surgical intervention .  The patient's history has been reviewed, patient examined, no change in status, stable for surgery.  I have reviewed the patient's chart and labs.  Questions were answered to the patient's satisfaction.     [name]

## 2018-02-18 ENCOUNTER — Encounter (HOSPITAL_COMMUNITY): Payer: Self-pay | Admitting: Orthopedic Surgery

## 2018-02-18 LAB — TYPE AND SCREEN
ABO/RH(D): O POS
ANTIBODY SCREEN: POSITIVE
UNIT DIVISION: 0
UNIT DIVISION: 0
UNIT DIVISION: 0
Unit division: 0

## 2018-02-18 LAB — BPAM RBC
BLOOD PRODUCT EXPIRATION DATE: 201908252359
Blood Product Expiration Date: 201907272359
Blood Product Expiration Date: 201908232359
Blood Product Expiration Date: 201908262359
UNIT TYPE AND RH: 1700
UNIT TYPE AND RH: 5100
Unit Type and Rh: 5100
Unit Type and Rh: 5100

## 2018-02-18 LAB — BASIC METABOLIC PANEL
Anion gap: 5 (ref 5–15)
BUN: 15 mg/dL (ref 8–23)
CALCIUM: 7.2 mg/dL — AB (ref 8.9–10.3)
CO2: 20 mmol/L — AB (ref 22–32)
CREATININE: 0.71 mg/dL (ref 0.44–1.00)
Chloride: 119 mmol/L — ABNORMAL HIGH (ref 98–111)
GFR calc non Af Amer: >60 mL/min (ref >60)
Glucose, Bld: 113 mg/dL — ABNORMAL HIGH (ref 70–99)
Potassium: 4.1 mmol/L (ref 3.5–5.1)
Sodium: 144 mmol/L (ref 135–145)

## 2018-02-18 LAB — CBC WITH DIFFERENTIAL/PLATELET
BASOS PCT: 0 %
Basophils Absolute: 0 10*3/uL (ref 0.0–0.1)
EOS ABS: 0 10*3/uL (ref 0.0–0.7)
Eosinophils Relative: 0 %
HCT: 27.8 % — ABNORMAL LOW (ref 36.0–46.0)
HEMOGLOBIN: 8.3 g/dL — AB (ref 12.0–15.0)
Lymphocytes Relative: 9 %
Lymphs Abs: 0.8 10*3/uL (ref 0.7–4.0)
MCH: 32.2 pg (ref 26.0–34.0)
MCHC: 29.9 g/dL — ABNORMAL LOW (ref 30.0–36.0)
MCV: 107.8 fL — ABNORMAL HIGH (ref 78.0–100.0)
MONO ABS: 0.9 10*3/uL (ref 0.1–1.0)
MONOS PCT: 10 %
NEUTROS PCT: 81 %
Neutro Abs: 7 10*3/uL (ref 1.7–7.7)
PLATELETS: 217 10*3/uL (ref 150–400)
RBC: 2.58 MIL/uL — ABNORMAL LOW (ref 3.87–5.11)
RDW: 19.5 % — AB (ref 11.5–15.5)
WBC: 8.7 10*3/uL (ref 4.0–10.5)

## 2018-02-18 MED ORDER — ONDANSETRON HCL 4 MG/2ML IJ SOLN
4.0000 mg | Freq: Four times a day (QID) | INTRAMUSCULAR | Status: DC | PRN
  Administered 2018-02-20 – 2018-02-21 (×2): 4 mg via INTRAVENOUS
  Filled 2018-02-18 (×5): qty 2

## 2018-02-18 MED ORDER — FERROUS SULFATE 325 (65 FE) MG PO TABS
325.0000 mg | ORAL_TABLET | Freq: Three times a day (TID) | ORAL | Status: DC
  Administered 2018-02-18 – 2018-02-21 (×11): 325 mg via ORAL
  Filled 2018-02-18 (×11): qty 1

## 2018-02-18 NOTE — Plan of Care (Signed)
No anxiety noted at this time. Will continue to monitor.  

## 2018-02-18 NOTE — Care Management Important Message (Signed)
Important Message  Patient Details  Name: Brittany Hensley MRN: [id number] Date of Birth: 05/19/1951   Medicare Important Message Given:  Yes    Sherald Barge, RN 02/18/2018, 11:06 AM

## 2018-02-18 NOTE — Evaluation (Signed)
Physical Therapy Evaluation Patient Details Name: Brittany Hensley MRN: [id number] DOB: 05-09-1951 Today's Date: 02/18/2018   History of Present Illness  Brittany Hensley is a 67 y/o femal s/p Left THA 02/17/18 with history of status post right total hip on May 30 for right hip fracture presents with left hip pain had an x-ray June 18 which was normal.  She presents now with continuing and worsening left hip pain when she is walking no real pain at rest seems to be worse when she gets up from sitting pain is mild she is still requiring the use of her walker and says her right hip feels fine.    Clinical Impression  Patient demonstrates slow slightly labored movement for sitting up, transfers to chair/commode and ambulation in hallway without loss of balance.  Patient limited mostly due to left hip pain and fatigue and tolerated sitting up in chair after therapy - RN notified.  Patient will benefit from continued physical therapy in hospital and recommended venue below to increase strength, balance, endurance for safe ADLs and gait.    Follow Up Recommendations SNF    Equipment Recommendations  None recommended by PT    Recommendations for Other Services       Precautions / Restrictions Precautions Precautions: Fall Restrictions Weight Bearing Restrictions: Yes LLE Weight Bearing: Weight bearing as tolerated      Mobility  Bed Mobility Overal bed mobility: Needs Assistance Bed Mobility: Supine to Sit     Supine to sit: Min assist     General bed mobility comments: requires assistance to move LLE  Transfers Overall transfer level: Needs assistance Equipment used: Rolling walker (2 wheeled) Transfers: Sit to/from [facility] Sit to Stand: Min guard Stand pivot transfers: Min assist       General transfer comment: labored movement  Ambulation/Gait Ambulation/Gait assistance: Herbalist (Feet): 45 Feet Assistive device: Rolling walker (2  wheeled) Gait Pattern/deviations: Decreased step length - left;Decreased stance time - left;Decreased stride length Gait velocity: slow   General Gait Details: slow slightly labored cadence with fair/good return for left heel to toe stepping without loss of balance, limited secondary to fatigue and left hip pain  Stairs            Wheelchair Mobility    Modified Rankin (Stroke Patients Only)       Balance Overall balance assessment: Needs assistance Sitting-balance support: Feet supported;No upper extremity supported Sitting balance-Leahy Scale: Good     Standing balance support: Bilateral upper extremity supported;During functional activity Standing balance-Leahy Scale: Fair                               Pertinent Vitals/Pain Pain Assessment: 0-10 Pain Score: 8  Pain Location: left hip Pain Descriptors / Indicators: Sore;Discomfort Pain Intervention(s): Limited activity within patient's tolerance;Monitored during session;Patient requesting pain meds-RN notified    Home Living Family/patient expects to be discharged to:: Private residence Living Arrangements: Alone Available Help at Discharge: Family Type of Home: House Home Access: Stairs to enter Entrance Stairs-Rails: Left Entrance Stairs-Number of Steps: 3 Home Layout: One level Home Equipment: [place] - 2 wheels;Shower seat      Prior Function Level of Independence: Needs assistance   Gait / Transfers Assistance Needed: Household ambulator with RW  ADL's / Homemaking Assistance Needed: her brother and son lives next to her and can assist if needed        Hand  Dominance        Extremity/Trunk Assessment   Upper Extremity Assessment Upper Extremity Assessment: Overall WFL for tasks assessed    Lower Extremity Assessment Lower Extremity Assessment: Generalized weakness;RLE deficits/detail;LLE deficits/detail RLE Deficits / Details: grossly 4+/5 LLE Deficits / Details: grossly 3+/5     Cervical / Trunk Assessment Cervical / Trunk Assessment: Normal  Communication   Communication: No difficulties  Cognition Arousal/Alertness: Awake/alert Behavior During Therapy: WFL for tasks assessed/performed Overall Cognitive Status: Within Functional Limits for tasks assessed                                        General Comments      Exercises     Assessment/Plan    PT Assessment Patient needs continued PT services  PT Problem List Decreased strength;Decreased activity tolerance;Decreased balance;Decreased mobility       PT Treatment Interventions Gait training;Stair training;Functional mobility training;Therapeutic activities;Therapeutic exercise;Patient/family education    PT Goals (Current goals can be found in the Care Plan section)  Acute Rehab PT Goals Patient Stated Goal: return home after rehab PT Goal Formulation: With patient Time For Goal Achievement: 03/07/18 Potential to Achieve Goals: Good    Frequency 7X/week   Barriers to discharge        Co-evaluation               AM-PAC PT "6 Clicks" Daily Activity  Outcome Measure Difficulty turning over in bed (including adjusting bedclothes, sheets and blankets)?: A Little Difficulty moving from lying on back to sitting on the side of the bed? : A Little Difficulty sitting down on and standing up from a chair with arms (e.g., wheelchair, bedside commode, etc,.)?: A Little Help needed moving to and from a bed to chair (including a wheelchair)?: A Little Help needed walking in hospital room?: A Little Help needed climbing 3-5 steps with a railing? : A Lot 6 Click Score: 17    End of Session   Activity Tolerance: Patient tolerated treatment well;Patient limited by fatigue Patient left: in chair;with call bell/phone within reach Nurse Communication: Mobility status;Other (comment)(RN notified that patient left up in chair) PT Visit Diagnosis: Unsteadiness on feet (R26.81);Other  abnormalities of gait and mobility (R26.89);Muscle weakness (generalized) (M62.81)    Time: 2229-7989 PT Time Calculation (min) (ACUTE ONLY): 33 min   Charges:   PT Evaluation $PT Eval Moderate Complexity: 1 Mod     PT G Codes:        1:45 PM, 13-Mar-2018 Lonell Grandchild, MPT Physical Therapist with [facility] [phone] office [phone] mobile phone

## 2018-02-18 NOTE — Plan of Care (Signed)
  Problem: Acute Rehab PT Goals(only PT should resolve) Goal: Pt Will Go Supine/Side To Sit Outcome: Progressing Flowsheets (Taken 02/18/2018 1346) Pt will go Supine/Side to Sit: with supervision Goal: Patient Will Transfer Sit To/From Stand Outcome: Progressing Flowsheets (Taken 02/18/2018 1346) Patient will transfer sit to/from stand: with supervision Goal: Pt Will Transfer Bed To Chair/Chair To Bed Outcome: Progressing Flowsheets (Taken 02/18/2018 1346) Pt will Transfer Bed to Chair/Chair to Bed: with supervision Goal: Pt Will Ambulate Outcome: Progressing Flowsheets (Taken 02/18/2018 1346) Pt will Ambulate: with supervision;75 feet;with rolling walker   1:46 PM, 02/18/18 Lonell Grandchild, MPT Physical Therapist with [facility] [phone] office [phone] mobile phone

## 2018-02-18 NOTE — Progress Notes (Signed)
Postop day 1 status post left total hip for left hip fracture  BP (!) 88/47 (BP Location: Left Arm)   Pulse 91   Temp 98.7 F (37.1 C) (Oral)   Resp 18   Ht 5\' 5"  (1.651 m)   Wt 88 lb (39.9 kg)   SpO2 98%   BMI 14.64 kg/m   CBC Latest Ref Rng & Units 02/18/2018 02/17/2018 02/14/2018  WBC 4.0 - 10.5 K/uL 8.7 4.7 5.3  Hemoglobin 12.0 - 15.0 g/dL 8.3(L) 10.7(L) 10.0(L)  Hematocrit 36.0 - 46.0 % 27.8(L) 36.0 32.3(L)  Platelets 150 - 400 K/uL 217 247 273   BMP Latest Ref Rng & Units 02/18/2018 02/17/2018 02/16/2018  Glucose 70 - 99 mg/dL 113(H) 90 89  BUN 8 - 23 mg/dL 15 12 15   Creatinine 0.44 - 1.00 mg/dL 0.71 0.69 0.78  BUN/Creat Ratio 12 - 28 - - -  Sodium 135 - 145 mmol/L 144 146(H) 146(H)  Potassium 3.5 - 5.1 mmol/L 4.1 3.5 3.2(L)  Chloride 98 - 111 mmol/L 119(H) 121(H) 122(H)  CO2 22 - 32 mmol/L 20(L) 21(L) 18(L)  Calcium 8.9 - 10.3 mg/dL 7.2(L) 7.7(L) 7.6(L)    Patient is in stable condition after left total hip replacement for left hip fracture.  Hemoglobin is 8.3 we will add iron as well as give her a bolus of fluid monitor the hemoglobin for possible transfusion.  She can start physical therapy and hopefully progress towards discharge to skilled nursing facility

## 2018-02-18 NOTE — Clinical Social Work Note (Addendum)
LCSW following. Plan is for rehab at [facility] at [facility]. Per MD, he is anticipating dc on Sunday. Updated [name] at [facility]. Awaiting insurance authorization for pt. Insurance RNs do work on Saturday so they will contact W/E LCSW if Josem Kaufmann is not determined by the end of the day today.   Weekend LCSW will assist with dc needs.   1430: Received call from Tammy at [employer] stating that because pt was just in SNF not very long ago, the medical director has to review for the authorization. Tammy states that they will not have an answer today but that they will call weekend CSW tomorrow.

## 2018-02-19 LAB — BASIC METABOLIC PANEL
ANION GAP: 6 (ref 5–15)
BUN: 23 mg/dL (ref 8–23)
CALCIUM: 7.6 mg/dL — AB (ref 8.9–10.3)
CO2: 18 mmol/L — ABNORMAL LOW (ref 22–32)
CREATININE: 0.84 mg/dL (ref 0.44–1.00)
Chloride: 119 mmol/L — ABNORMAL HIGH (ref 98–111)
Glucose, Bld: 89 mg/dL (ref 70–99)
Potassium: 3.2 mmol/L — ABNORMAL LOW (ref 3.5–5.1)
SODIUM: 143 mmol/L (ref 135–145)

## 2018-02-19 LAB — CBC WITH DIFFERENTIAL/PLATELET
BASOS ABS: 0 10*3/uL (ref 0.0–0.1)
BASOS PCT: 0 %
EOS ABS: 0 10*3/uL (ref 0.0–0.7)
Eosinophils Relative: 0 %
HCT: 27.1 % — ABNORMAL LOW (ref 36.0–46.0)
HEMOGLOBIN: 8.3 g/dL — AB (ref 12.0–15.0)
Lymphocytes Relative: 11 %
Lymphs Abs: 1.1 10*3/uL (ref 0.7–4.0)
MCH: 32.9 pg (ref 26.0–34.0)
MCHC: 30.6 g/dL (ref 30.0–36.0)
MCV: 107.5 fL — ABNORMAL HIGH (ref 78.0–100.0)
MONOS PCT: 9 %
Monocytes Absolute: 0.9 10*3/uL (ref 0.1–1.0)
NEUTROS ABS: 7.6 10*3/uL (ref 1.7–7.7)
NEUTROS PCT: 80 %
Platelets: 233 10*3/uL (ref 150–400)
RBC: 2.52 MIL/uL — ABNORMAL LOW (ref 3.87–5.11)
RDW: 18.9 % — ABNORMAL HIGH (ref 11.5–15.5)
WBC: 9.6 10*3/uL (ref 4.0–10.5)

## 2018-02-19 MED ORDER — POTASSIUM CHLORIDE CRYS ER 20 MEQ PO TBCR
20.0000 meq | EXTENDED_RELEASE_TABLET | Freq: Two times a day (BID) | ORAL | Status: DC
  Administered 2018-02-19 – 2018-02-21 (×5): 20 meq via ORAL
  Filled 2018-02-19 (×5): qty 1

## 2018-02-19 NOTE — Progress Notes (Signed)
Physical Therapy Treatment Patient Details Name: Brittany Hensley MRN: [id number] DOB: 22-Apr-1951 Today's Date: 02/19/2018    History of Present Illness Brittany Hensley is a 67 y/o femal s/p Left THA 02/17/18 with history of status post right total hip on May 30 for right hip fracture presents with left hip pain had an x-ray June 18 which was normal.  She presents now with continuing and worsening left hip pain when she is walking no real pain at rest seems to be worse when she gets up from sitting pain is mild she is still requiring the use of her walker and says her right hip feels fine.    PT Comments    Pt received in bed with family at bedside. Pt min A to min guard throughout all transfers, ambulation, and functional mobility. PT assisted pt to toilet and pt was limited to 1 lap in room with RW due to fatigue and L hip pain. Pt left in chair (RN notified) and PT provided pt with seated/supine exercises to perform while in chair/bed. Continue to recommend venue below to further maximize strength, balance, gait, and functional mobility in order to promote return to PLOF.    Follow Up Recommendations  SNF     Equipment Recommendations  None recommended by PT    Recommendations for Other Services       Precautions / Restrictions Precautions Precautions: Fall Restrictions Weight Bearing Restrictions: Yes LLE Weight Bearing: Weight bearing as tolerated    Mobility  Bed Mobility Overal bed mobility: Needs Assistance Bed Mobility: Supine to Sit     Supine to sit: Min assist;Min guard     General bed mobility comments: requires assistance to move LLE  Transfers Overall transfer level: Needs assistance Equipment used: Rolling walker (2 wheeled) Transfers: Sit to/from [facility] Sit to Stand: Min guard Stand pivot transfers: Min assist       General transfer comment: labored movement  Ambulation/Gait Ambulation/Gait assistance: Min assist;Min  guard Gait Distance (Feet): 40 Feet Assistive device: Rolling walker (2 wheeled) Gait Pattern/deviations: Decreased step length - left;Decreased stance time - left;Decreased stride length Gait velocity: slow   General Gait Details: cues to push RW and not lift it to advance it; gait continues to be slow, slightly labored cadence with fair/good return for left heel to toe stepping without loss of balance, limited secondary to fatigue and left hip pain   Stairs             Wheelchair Mobility    Modified Rankin (Stroke Patients Only)       Balance Overall balance assessment: Needs assistance Sitting-balance support: Feet supported;No upper extremity supported Sitting balance-Leahy Scale: Good     Standing balance support: Bilateral upper extremity supported;During functional activity Standing balance-Leahy Scale: Fair                              Cognition Arousal/Alertness: Awake/alert Behavior During Therapy: WFL for tasks assessed/performed Overall Cognitive Status: Within Functional Limits for tasks assessed                                        Exercises      General Comments        Pertinent Vitals/Pain Pain Assessment: Faces Faces Pain Scale: Hurts even more Pain Location: left hip Pain Descriptors / Indicators: Sore;Discomfort  Pain Intervention(s): Limited activity within patient's tolerance;Monitored during session;Repositioned;Patient requesting pain meds-RN notified    Home Living                      Prior Function            PT Goals (current goals can now be found in the care plan section) Acute Rehab PT Goals Patient Stated Goal: return home after rehab PT Goal Formulation: With patient Time For Goal Achievement: 03/07/18 Potential to Achieve Goals: Good    Frequency    7X/week      PT Plan      Co-evaluation              AM-PAC PT "6 Clicks" Daily Activity  Outcome Measure   Difficulty turning over in bed (including adjusting bedclothes, sheets and blankets)?: A Little Difficulty moving from lying on back to sitting on the side of the bed? : A Little Difficulty sitting down on and standing up from a chair with arms (e.g., wheelchair, bedside commode, etc,.)?: A Little Help needed moving to and from a bed to chair (including a wheelchair)?: A Little Help needed walking in hospital room?: A Little Help needed climbing 3-5 steps with a railing? : A Lot 6 Click Score: 17    End of Session Equipment Utilized During Treatment: Gait belt Activity Tolerance: Patient tolerated treatment well;Patient limited by fatigue Patient left: in chair;with call bell/phone within reach;with family/visitor present Nurse Communication: Mobility status;Other (comment)(RN notified that patient left up in chair) PT Visit Diagnosis: Unsteadiness on feet (R26.81);Other abnormalities of gait and mobility (R26.89);Muscle weakness (generalized) (M62.81)     Time: 8250-5397 PT Time Calculation (min) (ACUTE ONLY): 24 min  Charges:  $Therapeutic Activity: 23-37 mins                    G Codes:          Geraldine Solar PT, DPT

## 2018-02-19 NOTE — Progress Notes (Signed)
Patient ID: Brittany Hensley, female   DOB: 08-14-1950, 67 y.o.   MRN: [id number] POD # 2 LEFT THA FOR FEM NCK FRX                 HYPOKALEMIA                AC. BLOOD LOSS ANEMIA                POST OP ANEMIA                POST OP NAUSEA   BP (!) 102/57 (BP Location: Right Arm)   Pulse 85   Temp 98.6 F (37 C) (Oral)   Resp 16   Ht 5\' 5"  (1.651 m)   Wt 88 lb (39.9 kg)   SpO2 98%   BMI 14.64 kg/m   CBC Latest Ref Rng & Units 02/19/2018 02/18/2018 02/17/2018  WBC 4.0 - 10.5 K/uL 9.6 8.7 4.7  Hemoglobin 12.0 - 15.0 g/dL 8.3(L) 8.3(L) 10.7(L)  Hematocrit 36.0 - 46.0 % 27.1(L) 27.8(L) 36.0  Platelets 150 - 400 K/uL 233 217 247   BMP Latest Ref Rng & Units 02/19/2018 02/18/2018 02/17/2018  Glucose 70 - 99 mg/dL 89 113(H) 90  BUN 8 - 23 mg/dL 23 15 12   Creatinine 0.44 - 1.00 mg/dL 0.84 0.71 0.69  BUN/Creat Ratio 12 - 28 - - -  Sodium 135 - 145 mmol/L 143 144 146(H)  Potassium 3.5 - 5.1 mmol/L 3.2(L) 4.1 3.5  Chloride 98 - 111 mmol/L 119(H) 119(H) 121(H)  CO2 22 - 32 mmol/L 18(L) 20(L) 21(L)  Calcium 8.9 - 10.3 mg/dL 7.6(L) 7.2(L) 7.7(L)   SHE IS DOING WELL , HAS A LITTLE POST OP NAUSEA , AND C/O PAIN IN LEFT LEG BUT AMBULATED 50 FT   ADD POTASSIUM, CONTINUE IRON, THERAPY AND DISCHARGE TOMORROW

## 2018-02-19 NOTE — Clinical Social Work Note (Signed)
Clinical Social Worker continuing to follow patient and family for support and discharge planning needs.  CSW received insurance authorization from [employer] and notified [facility].  Facility planning for Sunday admission.  CSW remains available for support and to facilitate patient discharge needs once medically stable.  Authorization Number [id number] 7 day approval from date of admission  Barbette Or, LCSW (weekend coverage) [phone]

## 2018-02-20 LAB — CBC WITH DIFFERENTIAL/PLATELET
Basophils Absolute: 0 10*3/uL (ref 0.0–0.1)
Basophils Relative: 0 %
EOS ABS: 0 10*3/uL (ref 0.0–0.7)
Eosinophils Relative: 0 %
HEMATOCRIT: 24.6 % — AB (ref 36.0–46.0)
HEMOGLOBIN: 7.3 g/dL — AB (ref 12.0–15.0)
LYMPHS PCT: 10 %
Lymphs Abs: 0.7 10*3/uL (ref 0.7–4.0)
MCH: 32.3 pg (ref 26.0–34.0)
MCHC: 29.7 g/dL — ABNORMAL LOW (ref 30.0–36.0)
MCV: 108.8 fL — ABNORMAL HIGH (ref 78.0–100.0)
Monocytes Absolute: 0.5 10*3/uL (ref 0.1–1.0)
Monocytes Relative: 7 %
NEUTROS ABS: 5.9 10*3/uL (ref 1.7–7.7)
Neutrophils Relative %: 83 %
Platelets: 212 10*3/uL (ref 150–400)
RBC: 2.26 MIL/uL — ABNORMAL LOW (ref 3.87–5.11)
RDW: 18.5 % — ABNORMAL HIGH (ref 11.5–15.5)
WBC: 7.1 10*3/uL (ref 4.0–10.5)

## 2018-02-20 LAB — BASIC METABOLIC PANEL
ANION GAP: 4 — AB (ref 5–15)
BUN: 21 mg/dL (ref 8–23)
CO2: 18 mmol/L — AB (ref 22–32)
Calcium: 7.5 mg/dL — ABNORMAL LOW (ref 8.9–10.3)
Chloride: 119 mmol/L — ABNORMAL HIGH (ref 98–111)
Creatinine, Ser: 0.85 mg/dL (ref 0.44–1.00)
GFR calc Af Amer: >60 mL/min (ref >60)
GFR calc non Af Amer: >60 mL/min (ref >60)
GLUCOSE: 93 mg/dL (ref 70–99)
POTASSIUM: 3.8 mmol/L (ref 3.5–5.1)
Sodium: 141 mmol/L (ref 135–145)

## 2018-02-20 LAB — PREPARE RBC (CROSSMATCH)

## 2018-02-20 MED ORDER — SODIUM CHLORIDE 0.9% IV SOLUTION
Freq: Once | INTRAVENOUS | Status: AC
  Administered 2018-02-20: via INTRAVENOUS

## 2018-02-20 NOTE — Progress Notes (Signed)
Per blood bank, due to positive antibody screen blood match will come from [facility].  Blood will possibly be available tonight but most likely tomorrow.  Dr. Aline Brochure and patient have been made aware.

## 2018-02-20 NOTE — Progress Notes (Signed)
PT Cancellation Note  Patient Details Name: Brittany Hensley MRN: [id number] DOB: Dec 28, 1950   Cancelled Treatment:    Reason Eval/Treat Not Completed: Medical issues which prohibited therapy(pt's H&H trending downward and receiving 1 unit of blood; holding PT at this time and will check back at a later time/day as appropriate.)    Geraldine Solar PT, DPT

## 2018-02-20 NOTE — Progress Notes (Addendum)
Patient ID: Brittany Hensley, female   DOB: June 24, 1951, 67 y.o.   MRN: [id number]   S/p left tha  Hypokalemia Acute blood loss anemia   Pod # 3   She says she has no symptoms (ie. Denies sob, dizzy, lightheaded)  BP (!) 94/49 (BP Location: Left Arm)   Pulse 91   Temp 98.2 F (36.8 C) (Oral)   Resp 16   Ht 5\' 5"  (1.651 m)   Wt 88 lb (39.9 kg)   SpO2 96%   BMI 14.64 kg/m   CBC Latest Ref Rng & Units 02/20/2018 02/19/2018 02/18/2018  WBC 4.0 - 10.5 K/uL 7.1 9.6 8.7  Hemoglobin 12.0 - 15.0 g/dL 7.3(L) 8.3(L) 8.3(L)  Hematocrit 36.0 - 46.0 % 24.6(L) 27.1(L) 27.8(L)  Platelets 150 - 400 K/uL 212 233 217    BMP Latest Ref Rng & Units 02/20/2018 02/19/2018 02/18/2018  Glucose 70 - 99 mg/dL 93 89 113(H)  BUN 8 - 23 mg/dL 21 23 15   Creatinine 0.44 - 1.00 mg/dL 0.85 0.84 0.71  BUN/Creat Ratio 12 - 28 - - -  Sodium 135 - 145 mmol/L 141 143 144  Potassium 3.5 - 5.1 mmol/L 3.8 3.2(L) 4.1  Chloride 98 - 111 mmol/L 119(H) 119(H) 119(H)  CO2 22 - 32 mmol/L 18(L) 18(L) 20(L)  Calcium 8.9 - 10.3 mg/dL 7.5(L) 7.6(L) 7.2(L)    Transfuse 1 unit (if we can get type and cross, she has lots of antibodies and blood had to come from [place] preop). May have to treat with iron only if cant get blood typed-again currently assympyomatic  Wound: dry, ecchymotic without erythema)   Hold discharge

## 2018-02-21 ENCOUNTER — Ambulatory Visit: Payer: PPO | Admitting: Nurse Practitioner

## 2018-02-21 DIAGNOSIS — Z96642 Presence of left artificial hip joint: Secondary | ICD-10-CM | POA: Diagnosis not present

## 2018-02-21 DIAGNOSIS — S72042D Displaced fracture of base of neck of left femur, subsequent encounter for closed fracture with routine healing: Secondary | ICD-10-CM | POA: Diagnosis not present

## 2018-02-21 DIAGNOSIS — K219 Gastro-esophageal reflux disease without esophagitis: Secondary | ICD-10-CM | POA: Diagnosis not present

## 2018-02-21 DIAGNOSIS — D500 Iron deficiency anemia secondary to blood loss (chronic): Secondary | ICD-10-CM | POA: Diagnosis not present

## 2018-02-21 DIAGNOSIS — R636 Underweight: Secondary | ICD-10-CM | POA: Diagnosis not present

## 2018-02-21 DIAGNOSIS — M25552 Pain in left hip: Secondary | ICD-10-CM | POA: Diagnosis not present

## 2018-02-21 DIAGNOSIS — M6281 Muscle weakness (generalized): Secondary | ICD-10-CM | POA: Diagnosis not present

## 2018-02-21 DIAGNOSIS — M8000XD Age-related osteoporosis with current pathological fracture, unspecified site, subsequent encounter for fracture with routine healing: Secondary | ICD-10-CM | POA: Diagnosis not present

## 2018-02-21 DIAGNOSIS — E876 Hypokalemia: Secondary | ICD-10-CM | POA: Diagnosis not present

## 2018-02-21 DIAGNOSIS — E039 Hypothyroidism, unspecified: Secondary | ICD-10-CM | POA: Diagnosis not present

## 2018-02-21 DIAGNOSIS — F419 Anxiety disorder, unspecified: Secondary | ICD-10-CM | POA: Diagnosis not present

## 2018-02-21 LAB — CBC WITH DIFFERENTIAL/PLATELET
BASOS ABS: 0 10*3/uL (ref 0.0–0.1)
BASOS PCT: 0 %
EOS PCT: 2 %
Eosinophils Absolute: 0.1 10*3/uL (ref 0.0–0.7)
HCT: 28.6 % — ABNORMAL LOW (ref 36.0–46.0)
HEMOGLOBIN: 8.7 g/dL — AB (ref 12.0–15.0)
LYMPHS PCT: 14 %
Lymphs Abs: 0.8 10*3/uL (ref 0.7–4.0)
MCH: 31.1 pg (ref 26.0–34.0)
MCHC: 30.4 g/dL (ref 30.0–36.0)
MCV: 102.1 fL — ABNORMAL HIGH (ref 78.0–100.0)
MONO ABS: 0.6 10*3/uL (ref 0.1–1.0)
Monocytes Relative: 9 %
NEUTROS ABS: 4.5 10*3/uL (ref 1.7–7.7)
Neutrophils Relative %: 75 %
Platelets: 221 10*3/uL (ref 150–400)
RBC: 2.8 MIL/uL — AB (ref 3.87–5.11)
RDW: 21.8 % — AB (ref 11.5–15.5)
WBC: 6 10*3/uL (ref 4.0–10.5)

## 2018-02-21 LAB — TYPE AND SCREEN
ABO/RH(D): O POS
Antibody Screen: POSITIVE
UNIT DIVISION: 0

## 2018-02-21 LAB — BASIC METABOLIC PANEL
ANION GAP: 3 — AB (ref 5–15)
BUN: 18 mg/dL (ref 8–23)
CALCIUM: 7.7 mg/dL — AB (ref 8.9–10.3)
CO2: 21 mmol/L — ABNORMAL LOW (ref 22–32)
Chloride: 119 mmol/L — ABNORMAL HIGH (ref 98–111)
Creatinine, Ser: 0.86 mg/dL (ref 0.44–1.00)
GFR calc Af Amer: >60 mL/min (ref >60)
Glucose, Bld: 93 mg/dL (ref 70–99)
POTASSIUM: 4.7 mmol/L (ref 3.5–5.1)
SODIUM: 143 mmol/L (ref 135–145)

## 2018-02-21 LAB — BPAM RBC
Blood Product Expiration Date: 201908252359
ISSUE DATE / TIME: 201907212307
Unit Type and Rh: 5100

## 2018-02-21 MED ORDER — HYDROCODONE-ACETAMINOPHEN 5-325 MG PO TABS: 1.0000 | ORAL_TABLET | ORAL | 0 refills | Status: DC | PRN

## 2018-02-21 MED ORDER — ALPRAZOLAM 0.5 MG PO TABS: 0.5000 mg | ORAL_TABLET | Freq: Four times a day (QID) | ORAL | 2 refills | Status: DC | PRN

## 2018-02-21 MED ORDER — FERROUS SULFATE 325 (65 FE) MG PO TABS: 325.0000 mg | ORAL_TABLET | Freq: Two times a day (BID) | ORAL | 1 refills | Status: DC

## 2018-02-21 MED ORDER — ASPIRIN 325 MG PO TBEC: 325.0000 mg | DELAYED_RELEASE_TABLET | Freq: Two times a day (BID) | ORAL | 0 refills | Status: DC

## 2018-02-21 MED ORDER — ENSURE ENLIVE PO LIQD: 237.0000 mL | Freq: Two times a day (BID) | ORAL | 12 refills | Status: DC

## 2018-02-21 MED ORDER — POTASSIUM CHLORIDE CRYS ER 20 MEQ PO TBCR: 20.0000 meq | EXTENDED_RELEASE_TABLET | Freq: Two times a day (BID) | ORAL | 0 refills | Status: DC

## 2018-02-21 MED ORDER — PROMETHAZINE HCL 12.5 MG PO TABS: 12.5000 mg | ORAL_TABLET | Freq: Four times a day (QID) | ORAL | 0 refills | Status: DC | PRN

## 2018-02-21 NOTE — Care Management Important Message (Signed)
Important Message  Patient Details  Name: Brittany Hensley MRN: [id number] Date of Birth: 10/28/1950   Medicare Important Message Given:  Yes    Shelda Altes 02/21/2018, 11:45 AM

## 2018-02-21 NOTE — Progress Notes (Signed)
Report called to [facility]. 

## 2018-02-21 NOTE — Progress Notes (Signed)
Patient ID: Brittany Hensley, female   DOB: [birth year], 67 y.o.   MRN: [id number] BP 105/62 (BP Location: Left Arm)   Pulse 81   Temp 97.9 F (36.6 C) (Oral)   Resp 16   Ht 5\' 5"  (1.651 m)   Wt 88 lb (39.9 kg)   SpO2 98%   BMI 14.64 kg/m   CBC Latest Ref Rng & Units 02/20/2018 02/19/2018 02/18/2018  WBC 4.0 - 10.5 K/uL 7.1 9.6 8.7  Hemoglobin 12.0 - 15.0 g/dL 7.3(L) 8.3(L) 8.3(L)  Hematocrit 36.0 - 46.0 % 24.6(L) 27.1(L) 27.8(L)  Platelets 150 - 400 K/uL 212 233 217

## 2018-02-21 NOTE — Clinical Social Work Placement (Signed)
   CLINICAL SOCIAL WORK PLACEMENT  NOTE  Date:  02/21/2018  Patient Details  Name: Brittany Hensley MRN: [id number] Date of Birth: 09/25/1950  Clinical Social Work is seeking post-discharge placement for this patient at the [place] level of care (*CSW will initial, date and re-position this form in  chart as items are completed):  Yes   Patient/family provided with [place] Work Department's list of facilities offering this level of care within the geographic area requested by the patient (or if unable, by the patient's family).  Yes   Patient/family informed of their freedom to choose among providers that offer the needed level of care, that participate in Medicare, Medicaid or managed care program needed by the patient, have an available bed and are willing to accept the patient.  Yes   Patient/family informed of [place]'s ownership interest in [facility] and [facility], as well as of the fact that they are under no obligation to receive care at these facilities.  PASRR submitted to EDS on       PASRR number received on       Existing PASRR number confirmed on 02/17/18     FL2 transmitted to all facilities in geographic area requested by pt/family on 02/17/18     FL2 transmitted to all facilities within larger geographic area on       Patient informed that his/her managed care company has contracts with or will negotiate with certain facilities, including the following:        Yes   Patient/family informed of bed offers received.  Patient chooses bed at [facility]     Physician recommends and patient chooses bed at      Patient to be transferred to [facility] on 02/21/18.  Patient to be transferred to facility by [facility] w/c Lucianne Lei     Patient family notified on 02/21/18 of transfer.  Name of family member notified:  patient only     PHYSICIAN       Additional Comment: Pt stable for dc today. Pt will be going  to [facility]. Insurance provided auth for an initial 7 days. Updated [name] at [facility]. The SNF will transport with their w/c van. Updated pt's RN who will call report. DC clinical sent through the Hub. There are no other CSW needs for dc.    _______________________________________________ Shade Flood, LCSW 02/21/2018, 11:02 AM

## 2018-02-21 NOTE — Discharge Summary (Signed)
Physician Discharge Summary  Patient ID: Brittany Hensley MRN: [id number] DOB/AGE: 1950/08/23 67 y.o.  Admit date: 02/14/2018 Discharge date: 02/21/2018  Admission Diagnoses: Left hip fracture, hypokalemia  Discharge Diagnoses:  Active Problems:   Closed displaced fracture of left femoral neck (HCC)   Left displaced femoral neck fracture (HCC)   Status post total hip replacement, left Hypokalemia Acute blood loss anemia  Discharged Condition: good  Hospital Course: Patient was admitted on the 15th for left hip fracture discovered in the office.  On admission laboratory testing showed that she had hypokalemia along with her hip fracture.  We corrected her hypokalemia and she underwent surgery on 17 July with a left total hip.  We used a [place] Pinnacle cup with a Tri-Lock stem.  We used a direct lateral approach.  She tolerated surgery very well and on Sunday, July 21 she was found to have a hemoglobin of 7.3 although had no real symptoms.  We felt that it was prudent to transfuse her 1 unit of blood.   Discharge Exam: Blood pressure 105/62, pulse 81, temperature 97.9 F (36.6 C), temperature source Oral, resp. rate 16, height 5\' 5"  (1.651 m), weight 88 lb (39.9 kg), SpO2 98 %. Awake alert oriented no dizziness afebrile clean dry wound.  CBC Latest Ref Rng & Units 02/20/2018 02/19/2018 02/18/2018  WBC 4.0 - 10.5 K/uL 7.1 9.6 8.7  Hemoglobin 12.0 - 15.0 g/dL 7.3(L) 8.3(L) 8.3(L)  Hematocrit 36.0 - 46.0 % 24.6(L) 27.1(L) 27.8(L)  Platelets 150 - 400 K/uL 212 233 217     Disposition:    Allergies as of 02/21/2018      Reactions   Augmentin [amoxicillin-pot Clavulanate] Other (See Comments)   Side effects include loss of blood   Famotidine Other (See Comments)   Fever   Klonopin [clonazepam] Other (See Comments)   REACTION: double vision   Reglan [metoclopramide]    Causes confusion, anxiety   Nitrofurantoin Rash   Sulfamethoxazole-trimethoprim Rash      Medication List     TAKE these medications   ALPRAZolam 0.5 MG tablet Commonly known as:  XANAX Take 1 tablet (0.5 mg total) by mouth 4 (four) times daily as needed for anxiety. What changed:    when to take this  reasons to take this   aspirin 325 MG EC tablet Take 1 tablet (325 mg total) by mouth 2 (two) times daily.   CALCIUM CITRATE PO Take 600 mg by mouth 2 (two) times daily.   CENTRUM SILVER Chew Chew 1 tablet by mouth 2 (two) times daily.   cyclobenzaprine 5 MG tablet Commonly known as:  FLEXERIL Take 1 tablet (5 mg total) by mouth 3 (three) times daily as needed for muscle spasms.   docusate sodium 100 MG capsule Commonly known as:  COLACE Take 1 capsule (100 mg total) by mouth 2 (two) times daily.   feeding supplement (ENSURE ENLIVE) Liqd Take 237 mLs by mouth 2 (two) times daily between meals.   ferrous sulfate 325 (65 FE) MG tablet Take 1 tablet (325 mg total) by mouth 2 (two) times daily with a meal.   Flaxseed Oil 1200 MG Caps Take 1,200 mg by mouth daily with lunch.   furosemide 20 MG tablet Commonly known as:  LASIX Take 1 tablet (20 mg total) by mouth daily as needed for fluid.   HYDROcodone-acetaminophen 5-325 MG tablet Commonly known as:  NORCO/VICODIN Take 1 tablet by mouth every 4 (four) hours as needed for moderate pain.   levothyroxine  100 MCG tablet Commonly known as:  SYNTHROID, LEVOTHROID Take 1 tablet (100 mcg total) by mouth every morning.   loratadine 10 MG tablet Commonly known as:  CLARITIN Take 10 mg by mouth 2 (two) times daily.   omeprazole 40 MG capsule Commonly known as:  PRILOSEC Take 1 capsule (40 mg total) by mouth daily.   potassium chloride SA 20 MEQ tablet Commonly known as:  K-DUR,KLOR-CON Take 1 tablet (20 mEq total) by mouth 2 (two) times daily.   PROBIOTIC PO Take 1 capsule by mouth daily. 2 BILLION   promethazine 12.5 MG tablet Commonly known as:  PHENERGAN Take 1 tablet (12.5 mg total) by mouth every 6 (six) hours as needed  for nausea or vomiting.   sertraline 100 MG tablet Commonly known as:  ZOLOFT Take 2 tablets (200 mg total) by mouth daily. What changed:    how much to take  when to take this   sucralfate 1 g tablet Commonly known as:  CARAFATE Take 1 g by mouth 3 (three) times daily.   Vitamin C 500 MG Chew Chew 500 mg by mouth 2 (two) times daily.   Vitamin D3 2000 units Chew Chew 2,000 Units by mouth daily. Sublingual      Contact information for after-discharge care    [facility] .   Service:  Skilled Nursing Contact information: [address] [place] [phone]              Signed: [name] 02/21/2018, 7:52 AM

## 2018-02-22 ENCOUNTER — Telehealth: Payer: Self-pay | Admitting: Pulmonary Disease

## 2018-02-22 NOTE — Telephone Encounter (Signed)
Amy Burnett Harry from [facility] in [place] called to ask about clarification on treatment order for this patient.  Patient is s/p for L Hip Replacement on 02/17/18  Please call and advise  [phone]

## 2018-02-22 NOTE — Telephone Encounter (Signed)
She has asked about surgical dressing, if dressing changes need to be done, I have advised DrHarrison likes his surgical dressing to remain intact, on incision for 2 weeks, until their first post op. She has voiced understanding.   To you for documentation/ FYI

## 2018-02-23 DIAGNOSIS — E039 Hypothyroidism, unspecified: Secondary | ICD-10-CM | POA: Diagnosis not present

## 2018-02-23 DIAGNOSIS — S72042D Displaced fracture of base of neck of left femur, subsequent encounter for closed fracture with routine healing: Secondary | ICD-10-CM | POA: Diagnosis not present

## 2018-02-23 DIAGNOSIS — D500 Iron deficiency anemia secondary to blood loss (chronic): Secondary | ICD-10-CM | POA: Diagnosis not present

## 2018-02-23 DIAGNOSIS — K219 Gastro-esophageal reflux disease without esophagitis: Secondary | ICD-10-CM | POA: Diagnosis not present

## 2018-02-23 NOTE — Telephone Encounter (Signed)
I left message for nurse to call me back, spoke to [place]

## 2018-02-23 NOTE — Telephone Encounter (Signed)
Uh oh I changed that dressing to a daily as needed sorry

## 2018-02-24 ENCOUNTER — Ambulatory Visit: Payer: PPO | Admitting: Oncology

## 2018-02-24 ENCOUNTER — Other Ambulatory Visit: Payer: PPO

## 2018-02-25 ENCOUNTER — Ambulatory Visit: Payer: PPO | Admitting: Oncology

## 2018-02-25 ENCOUNTER — Other Ambulatory Visit: Payer: PPO

## 2018-02-25 ENCOUNTER — Other Ambulatory Visit: Payer: Self-pay | Admitting: Nurse Practitioner

## 2018-02-25 DIAGNOSIS — F411 Generalized anxiety disorder: Secondary | ICD-10-CM

## 2018-02-25 NOTE — Telephone Encounter (Signed)
Last seen 6/19  MMM

## 2018-02-26 ENCOUNTER — Other Ambulatory Visit: Payer: Self-pay | Admitting: *Deleted

## 2018-02-26 ENCOUNTER — Other Ambulatory Visit: Payer: PPO

## 2018-02-26 DIAGNOSIS — Z96642 Presence of left artificial hip joint: Secondary | ICD-10-CM | POA: Diagnosis not present

## 2018-02-26 DIAGNOSIS — M6281 Muscle weakness (generalized): Secondary | ICD-10-CM | POA: Diagnosis not present

## 2018-02-26 DIAGNOSIS — F419 Anxiety disorder, unspecified: Secondary | ICD-10-CM | POA: Diagnosis not present

## 2018-02-26 DIAGNOSIS — D500 Iron deficiency anemia secondary to blood loss (chronic): Secondary | ICD-10-CM | POA: Diagnosis not present

## 2018-02-26 DIAGNOSIS — K219 Gastro-esophageal reflux disease without esophagitis: Secondary | ICD-10-CM | POA: Diagnosis not present

## 2018-02-26 DIAGNOSIS — D508 Other iron deficiency anemias: Secondary | ICD-10-CM

## 2018-02-26 DIAGNOSIS — E039 Hypothyroidism, unspecified: Secondary | ICD-10-CM | POA: Diagnosis not present

## 2018-02-26 DIAGNOSIS — S72042D Displaced fracture of base of neck of left femur, subsequent encounter for closed fracture with routine healing: Secondary | ICD-10-CM | POA: Diagnosis not present

## 2018-02-26 DIAGNOSIS — Z7901 Long term (current) use of anticoagulants: Secondary | ICD-10-CM | POA: Diagnosis not present

## 2018-02-26 DIAGNOSIS — R636 Underweight: Secondary | ICD-10-CM | POA: Diagnosis not present

## 2018-02-27 LAB — CBC WITH DIFFERENTIAL/PLATELET
Basophils Absolute: 0 10*3/uL (ref 0.0–0.2)
Basos: 0 %
EOS (ABSOLUTE): 0 10*3/uL (ref 0.0–0.4)
EOS: 1 %
HEMATOCRIT: 31.3 % — AB (ref 34.0–46.6)
HEMOGLOBIN: 9.3 g/dL — AB (ref 11.1–15.9)
IMMATURE GRANS (ABS): 0 10*3/uL (ref 0.0–0.1)
Immature Granulocytes: 0 %
LYMPHS ABS: 0.8 10*3/uL (ref 0.7–3.1)
Lymphs: 11 %
MCH: 30.3 pg (ref 26.6–33.0)
MCHC: 29.7 g/dL — AB (ref 31.5–35.7)
MCV: 102 fL — ABNORMAL HIGH (ref 79–97)
MONOCYTES: 6 %
Monocytes Absolute: 0.5 10*3/uL (ref 0.1–0.9)
NEUTROS ABS: 5.9 10*3/uL (ref 1.4–7.0)
Neutrophils: 82 %
Platelets: 423 10*3/uL (ref 150–450)
RBC: 3.07 x10E6/uL — AB (ref 3.77–5.28)
RDW: 18.5 % — ABNORMAL HIGH (ref 12.3–15.4)
WBC: 7.1 10*3/uL (ref 3.4–10.8)

## 2018-02-27 LAB — FERRITIN: Ferritin: 334 ng/mL — ABNORMAL HIGH (ref 15–150)

## 2018-03-01 ENCOUNTER — Ambulatory Visit (INDEPENDENT_AMBULATORY_CARE_PROVIDER_SITE_OTHER): Payer: PPO | Admitting: Orthopaedic Surgery

## 2018-03-01 ENCOUNTER — Encounter: Payer: Self-pay | Admitting: Orthopaedic Surgery

## 2018-03-01 ENCOUNTER — Encounter: Payer: Self-pay | Admitting: *Deleted

## 2018-03-01 VITALS — BP 107/62 | HR 106 | Ht 65.0 in | Wt 92.0 lb

## 2018-03-01 DIAGNOSIS — Z96642 Presence of left artificial hip joint: Secondary | ICD-10-CM

## 2018-03-01 DIAGNOSIS — M80051A Age-related osteoporosis with current pathological fracture, right femur, initial encounter for fracture: Secondary | ICD-10-CM | POA: Insufficient documentation

## 2018-03-01 DIAGNOSIS — D508 Other iron deficiency anemias: Secondary | ICD-10-CM

## 2018-03-01 DIAGNOSIS — E611 Iron deficiency: Secondary | ICD-10-CM | POA: Insufficient documentation

## 2018-03-01 DIAGNOSIS — M25552 Pain in left hip: Secondary | ICD-10-CM

## 2018-03-01 NOTE — Progress Notes (Signed)
CC:  My hip is better  She is two weeks post hip fracture and total hip arthroplasty on the left by Dr. Aline Brochure.  He is out of town today.  She has done well.  She has no wound problems, she is using her walker well.  She has chronic iron deficiency and got her blood done several days ago showing a HGB of 9.3.  She had fallen to 7.3 in the hospital and had transfusion.  She has many antibodies in her blood and it is hard to type and cross match. Dr. Benay Spice is her physician who manages her iron deficiency and anemia.  I recommended she talk to him.  She gets IV iron infusions about twice a year she says.  Her hip wound looks good.  The staples will be removed.  Leg lengths are equal and motion is good.  NV intact. She is using the walker well.  Encounter Diagnoses  Name Primary?  . S/P hip replacement, left Yes  . Pain of left hip joint   . Low iron   . Other iron deficiency anemia    Return to see Dr. Aline Brochure in two weeks.  X-rays on return of the left hip prosthesis.  Call Dr. Benay Spice about her blood condition.  Call if any problem.  Precautions discussed.   Electronically Signed Sanjuana Kava, MD 7/30/201910:49 AM

## 2018-03-02 ENCOUNTER — Other Ambulatory Visit: Payer: Self-pay | Admitting: *Deleted

## 2018-03-02 ENCOUNTER — Telehealth: Payer: Self-pay | Admitting: Nurse Practitioner

## 2018-03-02 NOTE — Patient Outreach (Signed)
[facility]) Care Management  03/02/2018  Brittany Hensley [birth year] [id number]   Transition of Care Referral   Referral Date: 02/28/18 Referral Source: HTA  Date of Ad[facility]: [facility]  Diagnosis: s/p left hip replacement on 02/16/18 Date of Discharge:02/25/18 [facility]  Insurance: health team advantage  Outreach attempt #1 successful at the home number listed in epic  Brittany Hensley informs CM she does not have long to speak She is awaiting a call from a MD office but agrees for CM to call her back on Monday March 07 2018 prn  Patient is able to verify HIPAA Reviewed and addressed Transitional of care referral with patient  Social: Brittany Hensley reports she lives alone but has support of her sister, son and brother who live near her home She confirms since d/c she has been seen twice by advanced home care PT and tolerating weill   Conditions: s/p left hip replacement on 02/17/18, s/p right hip fx with replacement 12/30/17, iron deficiency anemia, GERD, atrioventricular nodal re entry tachycardia, hypothyroidism, general anxiety disorder, murmur,   Medications: She denies concerns with taking medications as prescribed, affording medications, side effects of medications and questions about medications  Appointments: Advance directives: she report seeing her surgeon's staff, Brittany Hensley on 03/01/18 since discharge but had to cancel her appointment 02/26/18 to see her primary MD as she is not driving at this time and depends on others for rides She was seen last by primary on 01/18/18  She is scheduled to see Brittany Hensley for 2 week f/u on 03/18/18  Consent: [facility] RN CM reviewed [facility] services with patient. Patient gave verbal consent for services.  Advised patient that other post discharge calls may occur to assess how the patient is doing following the recent hospitalization. Patient voiced understanding and was appreciative of  f/u call.  Plan: [facility] RN CM will send a [facility] successful outreach letter to Brittany Hensley after she request information about [facility] services when CM began to discuss the different professional services [facility] RN CM will follow up with Brittany Hensley as agreed upon within the next 5 business days   Kimberly L. Lavina Hamman, RN, BSN, CCM [facility] Telephonic Care Management Care Coordinator Direct number [phone]  Main [facility] number [phone] Fax number [phone]

## 2018-03-02 NOTE — Telephone Encounter (Signed)
Pt had to cancel her appt with MMM due to having 2nd hip replacement and wanted her to know she will be having cataract surgery soon. Just FYI.

## 2018-03-03 ENCOUNTER — Telehealth: Payer: Self-pay

## 2018-03-03 NOTE — Telephone Encounter (Signed)
Pt called to inquire about IV iron infusion. Pt had recent visit with a provider and discovered her "hemoglobin is low" and is requesting MD to review labs and advise on iron. This RN will forward to MD

## 2018-03-04 ENCOUNTER — Ambulatory Visit: Payer: PPO | Admitting: Nurse Practitioner

## 2018-03-07 ENCOUNTER — Other Ambulatory Visit: Payer: Self-pay | Admitting: *Deleted

## 2018-03-07 NOTE — Telephone Encounter (Signed)
Hemoglobin is low, likely secondary to recent hip surgeries, ferritin looks okay  She will need a red cell transfusion if she has symptomatic anemia  Should have her hemoglobin monitor closely over the next few weeks  If she still in skilled nursing facility or home?  Skilled nursing facility MD can arrange for a transfusion or we can see her here  Ask her if she has had any GI bleeding

## 2018-03-07 NOTE — Patient Outreach (Signed)
[facility]) Care Management  03/07/2018  Brittany Hensley April 29, 1951 [id number]   Transition of Care Referral  Referral Date: 02/28/18 Referral Source: HTA  Date of Ad[facility]: [facility]  Diagnosis: s/p left hip replacement on 02/16/18 Date of Discharge:02/25/18 [facility]  Insurance: health team advantage  Outreach attempt #2  successful at the home number listed in epic  She had called and left CM a voice message that she received CM's successful outreach letter and had further questions  Patient is able to verify HIPAA Reviewed and addressed Transitional of care referralwith patient Former Therapist, sports but on disability Social: Brittany Hensley continues to report she lives alone but has support of her sister, son and brother who live near her home She confirms advanced home care PT is scheduled to visit her today She reports she had been informed she had pulled muscles but Dr Aline Brochure found out she needed knee replacement  Conditions: s/p left hip replacement on 02/17/18, s/p right hip fx with replacement 12/30/17, iron deficiency anemia, GERD, atrioventricular nodal re entry tachycardia, hypothyroidism, general anxiety disorder, murmur,   Medications: She denies concerns with taking medications as prescribed, affording medications, side effects of medications and questions about medications  Appointments:  she report seeing her surgeon's staff, Dr Luna Glasgow on 03/01/18 since discharge but had to cancel her appointment 02/26/18 to see her primary MD as she is not driving at this time and depends on others for rides She was seen last by primary on 01/18/18  She is scheduled to see Dr Aline Brochure for 2 week f/u on 03/18/18 advance directives [facility] CM reviewed that [place] is available to assist but she prefers not to work on advance directives at this time She reports she will call CM back when she is ready to be referred to [place] for  assistance  Consent: [facility] RN CM reviewed [facility] services with patient. Patient gave verbal consent for services.  Advised patient that other post discharge calls may occur to assess how the patient is doing following the recent hospitalization. Patient voiced understanding and was appreciative of f/u call.  Plan: [facility] RN CM will close case at this time as patient has been assessed and no needs identified.     Brittany Hensley L. Lavina Hamman, RN, BSN, CCM [facility] Telephonic Care Management Care Coordinator Direct number [phone]  Main [facility] number [phone] Fax number [phone]

## 2018-03-08 ENCOUNTER — Other Ambulatory Visit: Payer: Self-pay | Admitting: *Deleted

## 2018-03-08 ENCOUNTER — Telehealth: Payer: Self-pay | Admitting: *Deleted

## 2018-03-08 ENCOUNTER — Telehealth: Payer: Self-pay | Admitting: Nurse Practitioner

## 2018-03-08 DIAGNOSIS — D508 Other iron deficiency anemias: Secondary | ICD-10-CM

## 2018-03-08 NOTE — Telephone Encounter (Signed)
Telephone call to patient to check on symptoms of anemia. Patient reports she has been sore and fatigued due to recent bilateral hip surgeries. Patient denies any bleeding, no SOB or other symptoms. Patient would like to have ferritin and CBC checked again. She reports the rehab had her on iron supplements. She refuses to take them at home as they constipate her. Patient scheduled for repeat lab work on Monday 8/12. Patient is preparing for cataract surgery and has an eye doctor appt on Monday also. Patient advised to call this office or go to ED with any signs of bleeding or she becomes symptomatic. Patient verbalized an understanding.

## 2018-03-10 ENCOUNTER — Telehealth: Payer: Self-pay | Admitting: Nurse Practitioner

## 2018-03-10 NOTE — Telephone Encounter (Signed)
Left Brittany Hensley with [employer] a message.  Brittany Hensley has bilateral hip fractures and is seeing ortho. We're not certain that she would be a good candidate to start [employer] right now. Brittany Hensley just wanted to see how her insurance would cover the medication if it was decided that she should start something for osteoporosis. This has not been discussed with Brittany Hensley and Brittany Hensley isn't certain that she will want to go on this injection. I will follow up with Brittany Hensley when she returns next week. If she does want to proceed I will do the prior authorization through Cover My Meds. She will need to talk with the patient as well.

## 2018-03-11 NOTE — Telephone Encounter (Signed)
Advised pt she ntbs but pt states she can't come in. Pt states it is just a red spot about the size of the tip of her little finger and it is sore. She wanted [place] to call something in. Advised pt we can't call anything in and that she should be seen but pt declined again so advised pt the best thing to do is to keep her weight off the area as much as possible and to use pillows to help position herself. Pt states she will and if it gets worse she will call back.

## 2018-03-11 NOTE — Telephone Encounter (Signed)
Spoke with [employer] Rep today. Advised that medical review is needed and that a letter of medical necessity may help. I printed out a sample letter from the website and will talk with Shelah Lewandowsky next week.

## 2018-03-14 ENCOUNTER — Telehealth: Payer: Self-pay

## 2018-03-14 ENCOUNTER — Inpatient Hospital Stay: Payer: PPO | Attending: Oncology

## 2018-03-14 DIAGNOSIS — D500 Iron deficiency anemia secondary to blood loss (chronic): Secondary | ICD-10-CM | POA: Diagnosis not present

## 2018-03-14 DIAGNOSIS — Z01818 Encounter for other preprocedural examination: Secondary | ICD-10-CM | POA: Diagnosis not present

## 2018-03-14 DIAGNOSIS — K922 Gastrointestinal hemorrhage, unspecified: Secondary | ICD-10-CM | POA: Insufficient documentation

## 2018-03-14 DIAGNOSIS — D508 Other iron deficiency anemias: Secondary | ICD-10-CM

## 2018-03-14 DIAGNOSIS — H2511 Age-related nuclear cataract, right eye: Secondary | ICD-10-CM | POA: Diagnosis not present

## 2018-03-14 LAB — CBC WITH DIFFERENTIAL (CANCER CENTER ONLY)
BASOS PCT: 0 %
Basophils Absolute: 0 10*3/uL (ref 0.0–0.1)
EOS ABS: 0 10*3/uL (ref 0.0–0.5)
Eosinophils Relative: 0 %
HEMATOCRIT: 33.3 % — AB (ref 34.8–46.6)
Hemoglobin: 9.7 g/dL — ABNORMAL LOW (ref 11.6–15.9)
Lymphocytes Relative: 18 %
Lymphs Abs: 1.2 10*3/uL (ref 0.9–3.3)
MCH: 29.9 pg (ref 25.1–34.0)
MCHC: 29.1 g/dL — AB (ref 31.5–36.0)
MCV: 102.8 fL — ABNORMAL HIGH (ref 79.5–101.0)
MONO ABS: 0.7 10*3/uL (ref 0.1–0.9)
MONOS PCT: 10 %
NEUTROS ABS: 4.9 10*3/uL (ref 1.5–6.5)
Neutrophils Relative %: 72 %
Platelet Count: 321 10*3/uL (ref 145–400)
RBC: 3.24 MIL/uL — ABNORMAL LOW (ref 3.70–5.45)
RDW: 19.3 % — ABNORMAL HIGH (ref 11.2–14.5)
WBC Count: 6.9 10*3/uL (ref 3.9–10.3)

## 2018-03-14 LAB — CMP (CANCER CENTER ONLY)
ALBUMIN: 2.9 g/dL — AB (ref 3.5–5.0)
ALK PHOS: 141 U/L — AB (ref 38–126)
ALT: 25 U/L (ref 0–44)
AST: 22 U/L (ref 15–41)
Anion gap: 8 (ref 5–15)
BUN: 26 mg/dL — ABNORMAL HIGH (ref 8–23)
CALCIUM: 7.9 mg/dL — AB (ref 8.9–10.3)
CO2: 15 mmol/L — AB (ref 22–32)
CREATININE: 0.9 mg/dL (ref 0.44–1.00)
Chloride: 119 mmol/L — ABNORMAL HIGH (ref 98–111)
GFR, Est AFR Am: >60 mL/min (ref >60)
GFR, Estimated: >60 mL/min (ref >60)
GLUCOSE: 99 mg/dL (ref 70–99)
Potassium: 4 mmol/L (ref 3.5–5.1)
SODIUM: 142 mmol/L (ref 135–145)
Total Bilirubin: <0.2 mg/dL — ABNORMAL LOW (ref 0.3–1.2)
Total Protein: 6.1 g/dL — ABNORMAL LOW (ref 6.5–8.1)

## 2018-03-14 LAB — FERRITIN: Ferritin: 182 ng/mL (ref 11–307)

## 2018-03-14 NOTE — Telephone Encounter (Addendum)
Pt voiced understanding of message below  ----- Message from Ladell Pier, MD sent at 03/14/2018  5:02 PM EDT ----- Please call patient the hemoglobin appears to be improving, ferritin is okay, return for an office visit, CBC, and ferritin in 3 months

## 2018-03-18 ENCOUNTER — Ambulatory Visit (INDEPENDENT_AMBULATORY_CARE_PROVIDER_SITE_OTHER): Payer: PPO

## 2018-03-18 ENCOUNTER — Ambulatory Visit (INDEPENDENT_AMBULATORY_CARE_PROVIDER_SITE_OTHER): Payer: PPO | Admitting: Orthopedic Surgery

## 2018-03-18 ENCOUNTER — Encounter: Payer: Self-pay | Admitting: Orthopedic Surgery

## 2018-03-18 VITALS — BP 98/52 | HR 97 | Ht 65.0 in | Wt 92.0 lb

## 2018-03-18 DIAGNOSIS — Z96642 Presence of left artificial hip joint: Secondary | ICD-10-CM

## 2018-03-18 DIAGNOSIS — S72002D Fracture of unspecified part of neck of left femur, subsequent encounter for closed fracture with routine healing: Secondary | ICD-10-CM

## 2018-03-18 DIAGNOSIS — Z96641 Presence of right artificial hip joint: Secondary | ICD-10-CM

## 2018-03-18 NOTE — Progress Notes (Signed)
Chief Complaint  Patient presents with  . Routine Post Op    left total hip 02/17/18 right total hip 12/30/17    BP (!) 98/52   Pulse 97   Ht 5\' 5"  (1.651 m)   Wt 92 lb (41.7 kg)   BMI 15.31 kg/m    She has no complaints today   Her legs are equal in length and she has pain free hip flexion 125 degrees  She is using a walker   xrays look good   F/u 4 weeks  Encounter Diagnoses  Name Primary?  . S/P hip replacement, right 12/30/17   . Closed fracture of neck of left femur with routine healing, subsequent encounter   . S/P hip replacement, left 02/17/18 Yes

## 2018-03-23 DIAGNOSIS — H25811 Combined forms of age-related cataract, right eye: Secondary | ICD-10-CM | POA: Diagnosis not present

## 2018-03-23 DIAGNOSIS — H2511 Age-related nuclear cataract, right eye: Secondary | ICD-10-CM | POA: Diagnosis not present

## 2018-03-25 ENCOUNTER — Telehealth: Payer: Self-pay | Admitting: Nurse Practitioner

## 2018-03-25 NOTE — Telephone Encounter (Signed)
Patient aware of appointment scheduled for 03/30/18 at 11:10 am with Particia Nearing

## 2018-03-27 ENCOUNTER — Other Ambulatory Visit: Payer: Self-pay | Admitting: Nurse Practitioner

## 2018-03-27 DIAGNOSIS — F411 Generalized anxiety disorder: Secondary | ICD-10-CM

## 2018-03-28 NOTE — Telephone Encounter (Signed)
Last seen 01/18/18  MMM

## 2018-03-30 ENCOUNTER — Encounter: Payer: Self-pay | Admitting: Physician Assistant

## 2018-03-30 ENCOUNTER — Ambulatory Visit (INDEPENDENT_AMBULATORY_CARE_PROVIDER_SITE_OTHER): Payer: PPO | Admitting: Physician Assistant

## 2018-03-30 VITALS — BP 111/72 | HR 80 | Temp 97.0°F | Ht 65.0 in | Wt 87.6 lb

## 2018-03-30 DIAGNOSIS — L89151 Pressure ulcer of sacral region, stage 1: Secondary | ICD-10-CM | POA: Diagnosis not present

## 2018-03-30 MED ORDER — CEPHALEXIN 250 MG PO CAPS: 250.0000 mg | ORAL_CAPSULE | Freq: Four times a day (QID) | ORAL | 0 refills | Status: DC

## 2018-03-30 MED ORDER — MUPIROCIN 2 % EX OINT: 1.0000 "application " | TOPICAL_OINTMENT | Freq: Two times a day (BID) | CUTANEOUS | 0 refills | Status: DC

## 2018-03-30 NOTE — Patient Instructions (Signed)
Pressure Injury A pressure injury, sometimes called a bedsore, is an injury to the skin and underlying tissue caused by pressure. Pressure on blood vessels causes decreased blood flow to the skin, which can eventually cause the skin tissue to die and break down into a wound. Pressure injuries usually occur:  Over bony parts of the body such as the tailbone, shoulders, elbows, hips, and heels.  Under medical devices such as respiratory equipment, stockings, tubes, and splints.  Pressure injuries start as reddened areas on the skin and can lead to pain, muscle damage, and infection. Pressure injuries can vary in severity. What are the causes? Pressure injuries are caused by a lack of blood supply to an area of skin. They can occur from intense pressure over a short period of time or from less intense pressure over a long period of time. What increases the risk? This condition is more likely to develop in people who:  Are in the hospital or an extended care facility.  Are bedridden or in a wheelchair.  Have an injury or disease that keeps them from: ? Moving normally. ? Feeling pain or pressure.  Have a condition that: ? Makes them sleepy or less alert. ? Causes poor blood flow.  Need to wear a medical device.  Have poor control of their bladder or bowel functions (incontinence).  Have poor nutrition (malnutrition).  Are of certain ethnicities. People of African American and Latino or Hispanic descent are at higher risk compared to other ethnic groups.  If you are at risk for pressure ulcers, your health care provider may recommend certain types of bedding to help prevent them. These may include foam or gel mattresses covered with one of the following:  A sheepskin blanket.  A pad that is filled with gel, air, water, or foam.  What are the signs or symptoms? The main symptom is a blister or change in skin color that opens into a wound. Other symptoms include:  Red or dark  areas of skin that do not turn white or pale when pressed with a finger.  Pain, warmth, or change of skin texture.  How is this diagnosed? This condition is diagnosed with a medical history and physical exam. You may also have tests, including:  Blood tests to check for infection or signs of poor nutrition.  Imaging studies to check for damage to the deep tissues under your skin.  Blood flow studies.  Your pressure injury will be staged to determine its severity. Staging is an assessment of:  The depth of the pressure injury.  Which tissues are exposed because of the pressure injury.  The causes of the pressure injury.  How is this treated? The main focus of treatment is to help your injury heal. This may be done by:  Relieving or redistributing pressure on your skin. This includes: ? Frequently changing your position. ? Eliminating or minimizing positions that caused the wound or that can make the wound worse. ? Using specific bed mattresses and chair cushions. ? Refitting, resizing, or replacing any medical devices, or padding the skin under them. ? Using creams or powders to prevent rubbing (friction) on the skin.  Keeping your skin clean and dry. This may include using a skin cleanser or skin protectant as told by your health care provider. This may be a lotion, ointment, or spray.  Cleaning your injury and removing any dead tissue from the wound (debridement).  Placing a bandage (dressing) over your injury.  Preventing or treating infection.  This may include antibiotic, antimicrobial, or antiseptic medicines.  Treatment may also include medicine for pain. Sometimes surgery is needed to close the wound with a flap of healthy skin or a piece of skin from another area of your body (graft). You may need surgery if other treatments are not working or if your injury is very deep. Follow these instructions at home: Wound care  Follow instructions from your health care  provider about: ? How to take care of your wound. ? When and how you should change your dressing. ? When you should remove your dressing. If your dressing is dry and stuck when you try to remove it, moisten or wet the dressing with saline or water so that it can be removed without harming your skin or wound tissue.  Check your wound every day for signs of infection. Have a caregiver do this for you if you are not able. Watch for: ? More redness, swelling, or pain. ? More fluid, blood, or pus. ? A bad smell. Skin Care  Keep your skin clean and dry. Gently pat your skin dry.  Do not rub or massage your skin.  Use a skin protectant only as told by your health care provider.  Check your skin every day for any changes in color or any new blisters or sores (ulcers). Have a caregiver do this for you if you are not able. Medicines  Take over-the-counter and prescription medicines only as told by your health care provider.  If you were prescribed an antibiotic medicine, take it or apply it as told by your health care provider. Do not stop taking or using the antibiotic even if your condition improves. Reducing and Redistributing Pressure  Do not lie or sit in one position for a long time. Move or change position every two hours or as told by your health care provider.  Use pillows or cushions to reduce pressure. Ask your health care provider to recommend cushions or pads for you.  Use medical devices that do not rub your skin. Tell your health care provider if one of your medical devices is causing a pressure injury to develop. General instructions   Eat a healthy diet that includes lots of protein. Ask your health care provider for diet advice.  Drink enough fluid to keep your urine clear or pale yellow.  Be as active as you can every day. Ask your health care provider to suggest safe exercises or activities.  Do not abuse drugs or alcohol.  Keep all follow-up visits as told by your  health care provider. This is important.  Do not smoke. Contact a health care provider if:   You have chills or fever.  Your pain medicine is not helping.  You have any changes in skin color.  You have new blisters or sores.  You develop warmth, redness, or swelling near a pressure injury.  You have a bad odor or pus coming from your pressure injury.  You lose control of your bowels or bladder.  You develop new symptoms.  Your wound does not improve after 1-2 weeks of treatment.  You develop a new medical condition, such as diabetes, peripheral vascular disease, or conditions that affect your defense (immune) system. This information is not intended to replace advice given to you by your health care provider. Make sure you discuss any questions you have with your health care provider. Document Released: 07/20/2005 Document Revised: 12/23/2015 Document Reviewed: 11/28/2014 Elsevier Interactive Patient Education  2018 [employer]  

## 2018-04-01 DIAGNOSIS — H2512 Age-related nuclear cataract, left eye: Secondary | ICD-10-CM | POA: Diagnosis not present

## 2018-04-01 DIAGNOSIS — H52202 Unspecified astigmatism, left eye: Secondary | ICD-10-CM | POA: Diagnosis not present

## 2018-04-01 DIAGNOSIS — H25812 Combined forms of age-related cataract, left eye: Secondary | ICD-10-CM | POA: Diagnosis not present

## 2018-04-03 DIAGNOSIS — H40033 Anatomical narrow angle, bilateral: Secondary | ICD-10-CM | POA: Diagnosis not present

## 2018-04-03 DIAGNOSIS — H04123 Dry eye syndrome of bilateral lacrimal glands: Secondary | ICD-10-CM | POA: Diagnosis not present

## 2018-04-04 NOTE — Progress Notes (Signed)
BP 111/72   Pulse 80   Temp (!) 97 F (36.1 C) (Oral)   Ht 5\' 5"  (1.651 m)   Wt 87 lb 9.6 oz (39.7 kg)   BMI 14.58 kg/m    Subjective:    Patient ID: Brittany Hensley, female    DOB: [birth year], 67 y.o.   MRN: [id number]  HPI: Brittany Hensley is a 67 y.o. female presenting on 03/30/2018 for bed sore Patient reports that she has a small bedsore that is quite tender on the sacral area.  She has lost a significant amount of weight and has no area that will cushion her back.  She has 2 hip replacements.  She has very limited range of motion in them.  She has not seen any pus or blood.   Past Medical History:  Diagnosis Date  . Anemia    iron   . Anxiety   . Diverticulitis    Hx: of  . GERD (gastroesophageal reflux disease)    at times  . History of kidney stones    10 years ago  . Hypothyroidism   . Low iron   . Pneumonia   . Rosacea    Hx: of   Relevant past medical, surgical, family and social history reviewed and updated as indicated. Interim medical history since our last visit reviewed. Allergies and medications reviewed and updated. DATA REVIEWED: CHART IN EPIC  Family History reviewed for pertinent findings.  Review of Systems  Constitutional: Negative.   HENT: Negative.   Eyes: Negative.   Respiratory: Negative.   Gastrointestinal: Negative.   Genitourinary: Negative.     Allergies as of 03/30/2018      Reactions   Augmentin [amoxicillin-pot Clavulanate] Other (See Comments)   Side effects include loss of blood   Famotidine Other (See Comments)   Fever   Klonopin [clonazepam] Other (See Comments)   REACTION: double vision   Reglan [metoclopramide]    Causes confusion, anxiety   Nitrofurantoin Rash   Sulfamethoxazole-trimethoprim Rash      Medication List        Accurate as of 03/30/18 11:59 PM. Always use your most recent med list.          ALPRAZolam 0.5 MG tablet Commonly known as:  XANAX Take 1 tablet (0.5 mg total) by mouth 4 (four)  times daily as needed for anxiety.   ALPRAZolam 0.5 MG tablet Commonly known as:  XANAX TAKE 1 TABLET 4 TIMES DAILY AS NEEDED FOR ANXIETY   aspirin 325 MG EC tablet Take 1 tablet (325 mg total) by mouth 2 (two) times daily.   CALCIUM CITRATE PO Take 600 mg by mouth 2 (two) times daily.   CENTRUM SILVER Chew Chew 1 tablet by mouth 2 (two) times daily.   cephALEXin 250 MG capsule Commonly known as:  KEFLEX Take 1 capsule (250 mg total) by mouth 4 (four) times daily.   cyclobenzaprine 5 MG tablet Commonly known as:  FLEXERIL Take 1 tablet (5 mg total) by mouth 3 (three) times daily as needed for muscle spasms.   docusate sodium 100 MG capsule Commonly known as:  COLACE Take 1 capsule (100 mg total) by mouth 2 (two) times daily.   Flaxseed Oil 1200 MG Caps Take 1,200 mg by mouth daily with lunch.   furosemide 20 MG tablet Commonly known as:  LASIX Take 1 tablet (20 mg total) by mouth daily as needed for fluid.   levothyroxine 100 MCG tablet Commonly known as:  SYNTHROID,  LEVOTHROID Take 1 tablet (100 mcg total) by mouth every morning.   loratadine 10 MG tablet Commonly known as:  CLARITIN Take 10 mg by mouth 2 (two) times daily.   mupirocin ointment 2 % Commonly known as:  BACTROBAN Place 1 application into the nose 2 (two) times daily.   omeprazole 40 MG capsule Commonly known as:  PRILOSEC Take 1 capsule (40 mg total) by mouth daily.   potassium chloride SA 20 MEQ tablet Commonly known as:  K-DUR,KLOR-CON Take 1 tablet (20 mEq total) by mouth 2 (two) times daily.   PROBIOTIC PO Take 1 capsule by mouth daily. 2 BILLION   promethazine 12.5 MG tablet Commonly known as:  PHENERGAN Take 1 tablet (12.5 mg total) by mouth every 6 (six) hours as needed for nausea or vomiting.   sertraline 100 MG tablet Commonly known as:  ZOLOFT Take 2 tablets (200 mg total) by mouth daily.   sucralfate 1 g tablet Commonly known as:  CARAFATE Take 1 g by mouth 3 (three) times  daily.   Vitamin C 500 MG Chew Chew 500 mg by mouth 2 (two) times daily.   Vitamin D3 2000 units Chew Chew 2,000 Units by mouth daily. Sublingual          Objective:    BP 111/72   Pulse 80   Temp (!) 97 F (36.1 C) (Oral)   Ht 5\' 5"  (1.651 m)   Wt 87 lb 9.6 oz (39.7 kg)   BMI 14.58 kg/m   Allergies  Allergen Reactions  . Augmentin [Amoxicillin-Pot Clavulanate] Other (See Comments)    Side effects include loss of blood  . Famotidine Other (See Comments)    Fever   . Klonopin [Clonazepam] Other (See Comments)    REACTION: double vision  . Reglan [Metoclopramide]     Causes confusion, anxiety  . Nitrofurantoin Rash  . Sulfamethoxazole-Trimethoprim Rash    Wt Readings from Last 3 Encounters:  03/30/18 87 lb 9.6 oz (39.7 kg)  03/18/18 92 lb (41.7 kg)  03/01/18 92 lb (41.7 kg)    Physical Exam  Constitutional: She is oriented to person, place, and time. She appears well-developed and well-nourished.  HENT:  Head: Normocephalic and atraumatic.  Eyes: Pupils are equal, round, and reactive to light. Conjunctivae and EOM are normal.  Cardiovascular: Normal rate, regular rhythm, normal heart sounds and intact distal pulses.  Pulmonary/Chest: Effort normal and breath sounds normal.  Abdominal: Soft. Bowel sounds are normal.  Neurological: She is alert and oriented to person, place, and time. She has normal reflexes.  Skin: Skin is warm and dry. Lesion noted. No rash noted.     1 cm shallow ulcer over sacrum  Psychiatric: She has a normal mood and affect. Her behavior is normal. Judgment and thought content normal.    Results for orders placed or performed in visit on 03/14/18  Ferritin  Result Value Ref Range   Ferritin 182 11 - 307 ng/mL  CMP (Cancer Center only)  Result Value Ref Range   Sodium 142 135 - 145 mmol/L   Potassium 4.0 3.5 - 5.1 mmol/L   Chloride 119 (H) 98 - 111 mmol/L   CO2 15 (L) 22 - 32 mmol/L   Glucose, Bld 99 70 - 99 mg/dL   BUN 26 (H) 8 -  23 mg/dL   Creatinine 0.90 0.44 - 1.00 mg/dL   Calcium 7.9 (L) 8.9 - 10.3 mg/dL   Total Protein 6.1 (L) 6.5 - 8.1 g/dL   Albumin  2.9 (L) 3.5 - 5.0 g/dL   AST 22 15 - 41 U/L   ALT 25 0 - 44 U/L   Alkaline Phosphatase 141 (H) 38 - 126 U/L   Total Bilirubin <0.2 (L) 0.3 - 1.2 mg/dL   GFR, Est Non Af Am >60 >60 mL/min   GFR, Est AFR Am >60 >60 mL/min   Anion gap 8 5 - 15  CBC with Differential (Cancer Center Only)  Result Value Ref Range   WBC Count 6.9 3.9 - 10.3 K/uL   RBC 3.24 (L) 3.70 - 5.45 MIL/uL   Hemoglobin 9.7 (L) 11.6 - 15.9 g/dL   HCT 33.3 (L) 34.8 - 46.6 %   MCV 102.8 (H) 79.5 - 101.0 fL   MCH 29.9 25.1 - 34.0 pg   MCHC 29.1 (L) 31.5 - 36.0 g/dL   RDW 19.3 (H) 11.2 - 14.5 %   Platelet Count 321 145 - 400 K/uL   Neutrophils Relative % 72 %   Neutro Abs 4.9 1.5 - 6.5 K/uL   Lymphocytes Relative 18 %   Lymphs Abs 1.2 0.9 - 3.3 K/uL   Monocytes Relative 10 %   Monocytes Absolute 0.7 0.1 - 0.9 K/uL   Eosinophils Relative 0 %   Eosinophils Absolute 0.0 0.0 - 0.5 K/uL   Basophils Relative 0 %   Basophils Absolute 0.0 0.0 - 0.1 K/uL      Assessment & Plan:   1. Pressure injury of sacral region, stage 1 - cephALEXin (KEFLEX) 250 MG capsule; Take 1 capsule (250 mg total) by mouth 4 (four) times daily.  Dispense: 30 capsule; Refill: 0 - mupirocin ointment (BACTROBAN) 2 %; Place 1 application into the nose 2 (two) times daily.  Dispense: 22 g; Refill: 0   Continue all other maintenance medications as listed above.  Follow up plan: Return if symptoms worsen or fail to improve.  Educational handout given for [place] PA-C [place] [address]  [place], [place] [address] [phone]   04/04/2018, 8:12 PM

## 2018-04-05 ENCOUNTER — Other Ambulatory Visit: Payer: Self-pay | Admitting: Physician Assistant

## 2018-04-05 DIAGNOSIS — M8000XA Age-related osteoporosis with current pathological fracture, unspecified site, initial encounter for fracture: Secondary | ICD-10-CM

## 2018-04-06 ENCOUNTER — Other Ambulatory Visit: Payer: Self-pay | Admitting: Physician Assistant

## 2018-04-06 ENCOUNTER — Telehealth: Payer: Self-pay | Admitting: Nurse Practitioner

## 2018-04-06 DIAGNOSIS — L89151 Pressure ulcer of sacral region, stage 1: Secondary | ICD-10-CM

## 2018-04-06 DIAGNOSIS — H04123 Dry eye syndrome of bilateral lacrimal glands: Secondary | ICD-10-CM | POA: Diagnosis not present

## 2018-04-06 MED ORDER — CEPHALEXIN 250 MG PO CAPS: 250.0000 mg | ORAL_CAPSULE | Freq: Four times a day (QID) | ORAL | 1 refills | Status: DC

## 2018-04-06 NOTE — Telephone Encounter (Signed)
Aware. 

## 2018-04-06 NOTE — Telephone Encounter (Signed)
sent 

## 2018-04-08 DIAGNOSIS — G44219 Episodic tension-type headache, not intractable: Secondary | ICD-10-CM | POA: Diagnosis not present

## 2018-04-08 NOTE — Telephone Encounter (Signed)
Scheduled for a dexa scan on 04/25/18. Results will be helpful in requesting coverage. Dexa is also recommended within 6 months after a fracture anyway. Will wait for results before proceeding with coverage request.

## 2018-04-11 ENCOUNTER — Other Ambulatory Visit: Payer: Self-pay | Admitting: Physician Assistant

## 2018-04-11 DIAGNOSIS — L89151 Pressure ulcer of sacral region, stage 1: Secondary | ICD-10-CM

## 2018-04-15 ENCOUNTER — Ambulatory Visit (INDEPENDENT_AMBULATORY_CARE_PROVIDER_SITE_OTHER): Payer: PPO | Admitting: Orthopedic Surgery

## 2018-04-15 VITALS — BP 109/70 | HR 105 | Ht 65.0 in | Wt 90.0 lb

## 2018-04-15 DIAGNOSIS — Z96642 Presence of left artificial hip joint: Secondary | ICD-10-CM

## 2018-04-15 DIAGNOSIS — Z96641 Presence of right artificial hip joint: Secondary | ICD-10-CM

## 2018-04-15 NOTE — Progress Notes (Signed)
Chief Complaint  Patient presents with  . Hip Pain    DOS 02/17/18 Left hip     Postop from 2 total hip replacements in the last 3 to 4 months for hip fractures.  She has no complaints she is walking independently  She is without a limp having no pain she has excellent hip flexion  I will see her in a month but so far doing very well  Encounter Diagnoses  Name Primary?  . Status post total hip replacement, left 02/17/18 Yes  . S/P hip replacement, right 12/30/17

## 2018-04-18 ENCOUNTER — Telehealth: Payer: Self-pay | Admitting: Oncology

## 2018-04-18 ENCOUNTER — Telehealth: Payer: Self-pay | Admitting: Hematology and Oncology

## 2018-04-18 NOTE — Telephone Encounter (Signed)
Patient called to reschedule  °

## 2018-04-22 DIAGNOSIS — H04123 Dry eye syndrome of bilateral lacrimal glands: Secondary | ICD-10-CM | POA: Diagnosis not present

## 2018-04-25 ENCOUNTER — Other Ambulatory Visit: Payer: PPO

## 2018-04-25 ENCOUNTER — Other Ambulatory Visit: Payer: Self-pay | Admitting: Physician Assistant

## 2018-04-25 ENCOUNTER — Other Ambulatory Visit: Payer: Self-pay | Admitting: Nurse Practitioner

## 2018-04-25 DIAGNOSIS — L89151 Pressure ulcer of sacral region, stage 1: Secondary | ICD-10-CM

## 2018-04-25 DIAGNOSIS — F411 Generalized anxiety disorder: Secondary | ICD-10-CM

## 2018-04-27 ENCOUNTER — Other Ambulatory Visit: Payer: Self-pay | Admitting: Nurse Practitioner

## 2018-04-27 DIAGNOSIS — D649 Anemia, unspecified: Secondary | ICD-10-CM

## 2018-04-28 DIAGNOSIS — H04123 Dry eye syndrome of bilateral lacrimal glands: Secondary | ICD-10-CM | POA: Diagnosis not present

## 2018-05-06 ENCOUNTER — Encounter: Payer: Self-pay | Admitting: Nurse Practitioner

## 2018-05-06 ENCOUNTER — Ambulatory Visit: Payer: PPO | Admitting: Nurse Practitioner

## 2018-05-06 ENCOUNTER — Ambulatory Visit (INDEPENDENT_AMBULATORY_CARE_PROVIDER_SITE_OTHER): Payer: PPO | Admitting: Nurse Practitioner

## 2018-05-06 VITALS — BP 122/75 | HR 72 | Temp 97.2°F | Ht 65.0 in | Wt 90.0 lb

## 2018-05-06 DIAGNOSIS — F411 Generalized anxiety disorder: Secondary | ICD-10-CM

## 2018-05-06 DIAGNOSIS — E034 Atrophy of thyroid (acquired): Secondary | ICD-10-CM | POA: Diagnosis not present

## 2018-05-06 DIAGNOSIS — Z681 Body mass index (BMI) 19 or less, adult: Secondary | ICD-10-CM

## 2018-05-06 DIAGNOSIS — Z903 Acquired absence of stomach [part of]: Secondary | ICD-10-CM | POA: Diagnosis not present

## 2018-05-06 DIAGNOSIS — M80051A Age-related osteoporosis with current pathological fracture, right femur, initial encounter for fracture: Secondary | ICD-10-CM

## 2018-05-06 DIAGNOSIS — D508 Other iron deficiency anemias: Secondary | ICD-10-CM | POA: Diagnosis not present

## 2018-05-06 DIAGNOSIS — K912 Postsurgical malabsorption, not elsewhere classified: Secondary | ICD-10-CM | POA: Diagnosis not present

## 2018-05-06 DIAGNOSIS — K219 Gastro-esophageal reflux disease without esophagitis: Secondary | ICD-10-CM

## 2018-05-06 MED ORDER — SERTRALINE HCL 100 MG PO TABS: 100.0000 mg | ORAL_TABLET | Freq: Every day | ORAL | 1 refills | Status: DC

## 2018-05-06 MED ORDER — LEVOTHYROXINE SODIUM 100 MCG PO TABS: 100.0000 ug | ORAL_TABLET | Freq: Every morning | ORAL | 1 refills | Status: DC

## 2018-05-06 MED ORDER — OMEPRAZOLE 40 MG PO CPDR: 40.0000 mg | DELAYED_RELEASE_CAPSULE | Freq: Every day | ORAL | 1 refills | Status: DC

## 2018-05-06 MED ORDER — ALPRAZOLAM 0.5 MG PO TABS: 0.5000 mg | ORAL_TABLET | Freq: Four times a day (QID) | ORAL | 2 refills | Status: DC | PRN

## 2018-05-06 NOTE — Progress Notes (Signed)
Subjective:    Patient ID: Brittany Hensley, female    DOB: 05-10-1951, 67 y.o.   MRN: [id number]   Chief Complaint: Medical Management of Chronic Issues   HPI:  1. Gastroesophageal reflux disease without esophagitis  Is on omeprazole daily. Works well to keep symptoms under control.  2. Postgastrectomy malabsorption  This is partly why she is so thin  3. Hypothyroidism due to acquired atrophy of thyroid  No problems that she is aware of.  4. Other iron deficiency anemia  Will check labs this morning  5. GAD (generalized anxiety disorder)  Is on xanax 4x a day. Gets very anxious without  6. BMI less than 19,adult  still under weight  7. Age-related osteoporosis with current pathological fracture of right femur, initial encounter ([facility]) she  broke hip by moving some heavy stuff and shortly after repair she fractured the left hip. She says she is doing well. No pain to speak of. We want to start her on reclast, but Dr. Learta Codding , her hematologist, told her not to ever take anything like that. She is going to speak with him and let me know what he says.    Outpatient Encounter Medications as of 05/06/2018  Medication Sig  . ALPRAZolam (XANAX) 0.5 MG tablet Take 1 tablet (0.5 mg total) by mouth 4 (four) times daily as needed for anxiety.  . Ascorbic Acid (VITAMIN C) 500 MG CHEW Chew 500 mg by mouth 2 (two) times daily.  Marland Kitchen aspirin EC 325 MG EC tablet Take 1 tablet (325 mg total) by mouth 2 (two) times daily.  Marland Kitchen CALCIUM CITRATE PO Take 600 mg by mouth 2 (two) times daily.   . Cholecalciferol (VITAMIN D3) 2000 units CHEW Chew 2,000 Units by mouth daily. Sublingual  . docusate sodium (COLACE) 100 MG capsule Take 1 capsule (100 mg total) by mouth 2 (two) times daily.  . Flaxseed, Linseed, (FLAXSEED OIL) 1200 MG CAPS Take 1,200 mg by mouth daily with lunch.  . furosemide (LASIX) 20 MG tablet Take 1 tablet (20 mg total) by mouth daily as needed for fluid.  Marland Kitchen levothyroxine (SYNTHROID,  LEVOTHROID) 100 MCG tablet Take 1 tablet (100 mcg total) by mouth every morning.  . loratadine (CLARITIN) 10 MG tablet Take 10 mg by mouth 2 (two) times daily.   . Multiple Vitamins-Minerals (CENTRUM SILVER) CHEW Chew 1 tablet by mouth 2 (two) times daily.  . mupirocin ointment (BACTROBAN) 2 % APPLY TO THE INSIDE OF BOTH NOSTRILS TWICE DAILY FOR 5 DAYS  . omeprazole (PRILOSEC) 40 MG capsule Take 1 capsule (40 mg total) by mouth daily.  . Probiotic Product (PROBIOTIC PO) Take 1 capsule by mouth daily. 2 BILLION  . sertraline (ZOLOFT) 100 MG tablet Take 2 tablets (200 mg total) by mouth daily. (Patient taking differently: Take 100 mg by mouth at bedtime. )  . sucralfate (CARAFATE) 1 g tablet TAKE ONE (1) TABLET THREE (3) TIMES EACH DAY  . [DISCONTINUED] ALPRAZolam (XANAX) 0.5 MG tablet TAKE 1 TABLET 4 TIMES DAILY AS NEEDED FOR ANXIETY (Patient not taking: Reported on 04/15/2018)       New complaints: None new today  Social history: Lives by herself. Son lives next door. She no longer works.    Review of Systems  Constitutional: Negative for activity change and appetite change.  HENT: Negative.   Eyes: Negative for pain.  Respiratory: Negative for shortness of breath.   Cardiovascular: Negative for chest pain, palpitations and leg swelling.  Gastrointestinal: Negative for  abdominal pain.  Endocrine: Negative for polydipsia.  Genitourinary: Negative.   Skin: Negative for rash.  Neurological: Negative for dizziness, weakness and headaches.  Hematological: Does not bruise/bleed easily.  Psychiatric/Behavioral: Negative.   All other systems reviewed and are negative.      Objective:   Physical Exam  Constitutional: She is oriented to person, place, and time. She appears well-developed and well-nourished. No distress.  HENT:  Head: Normocephalic.  Nose: Nose normal.  Mouth/Throat: Oropharynx is clear and moist.  Eyes: Pupils are equal, round, and reactive to light. EOM are normal.   Neck: Normal range of motion. Neck supple. No JVD present. Carotid bruit is not present.  Cardiovascular: Normal rate, regular rhythm, normal heart sounds and intact distal pulses.  Pulmonary/Chest: Effort normal and breath sounds normal. No respiratory distress. She has no wheezes. She has no rales. She exhibits no tenderness.  Abdominal: Soft. Normal appearance, normal aorta and bowel sounds are normal. She exhibits no distension, no abdominal bruit, no pulsatile midline mass and no mass. There is no splenomegaly or hepatomegaly. There is no tenderness.  Musculoskeletal: Normal range of motion. She exhibits no edema.  Lymphadenopathy:    She has no cervical adenopathy.  Neurological: She is alert and oriented to person, place, and time. She has normal reflexes.  Skin: Skin is warm and dry. There is pallor.  Psychiatric: She has a normal mood and affect. Her behavior is normal. Judgment and thought content normal.  Nursing note and vitals reviewed.  BP 122/75   Pulse 72   Temp (!) 97.2 F (36.2 C) (Oral)   Ht 5\' 5"  (1.651 m)   Wt 90 lb (40.8 kg)   BMI 14.98 kg/m         Assessment & Plan:  DEVANN CRIBB comes in today with chief complaint of Medical Management of Chronic Issues   Diagnosis and orders addressed:  1. Gastroesophageal reflux disease without esophagitis Avoid spicy foods Do not eat 2 hours prior to bedtime - omeprazole (PRILOSEC) 40 MG capsule; Take 1 capsule (40 mg total) by mouth daily.  Dispense: 90 capsule; Refill: 1  2. Postgastrectomy malabsorption Eat frequent small meals  3. Hypothyroidism due to acquired atrophy of thyroid - levothyroxine (SYNTHROID, LEVOTHROID) 100 MCG tablet; Take 1 tablet (100 mcg total) by mouth every morning.  Dispense: 90 tablet; Refill: 1  4. Other iron deficiency anemia Patient to see hematology in 2 weeks  5. GAD (generalized anxiety disorder) Stress management - ALPRAZolam (XANAX) 0.5 MG tablet; Take 1 tablet (0.5 mg  total) by mouth 4 (four) times daily as needed for anxiety.  Dispense: 120 tablet; Refill: 2 - sertraline (ZOLOFT) 100 MG tablet; Take 1 tablet (100 mg total) by mouth at bedtime.  Dispense: 90 tablet; Refill: 1  6. BMI less than 19,adult Again eat frequent small meals throughout day  7. Age-related osteoporosis with current pathological fracture of right femur, initial encounter [facility]) Message sent to Dr. Benay Spice to discuss reclast   Labs pending Health Maintenance reviewed Diet and exercise encouraged  Follow up plan: 3 months   Mary-Margaret Hassell Done, FNP

## 2018-05-06 NOTE — Patient Instructions (Signed)

## 2018-05-18 ENCOUNTER — Encounter: Payer: Self-pay | Admitting: Orthopedic Surgery

## 2018-05-18 ENCOUNTER — Ambulatory Visit (INDEPENDENT_AMBULATORY_CARE_PROVIDER_SITE_OTHER): Payer: PPO | Admitting: Orthopedic Surgery

## 2018-05-18 ENCOUNTER — Telehealth: Payer: Self-pay | Admitting: Orthopedic Surgery

## 2018-05-18 VITALS — BP 138/83 | HR 87 | Ht 65.0 in | Wt 93.0 lb

## 2018-05-18 DIAGNOSIS — Z96642 Presence of left artificial hip joint: Secondary | ICD-10-CM

## 2018-05-18 DIAGNOSIS — Z96641 Presence of right artificial hip joint: Secondary | ICD-10-CM

## 2018-05-18 NOTE — Telephone Encounter (Signed)
Noted. Patient aware 

## 2018-05-18 NOTE — Telephone Encounter (Signed)
I will mail it to her now, thanks

## 2018-05-18 NOTE — Telephone Encounter (Signed)
Patient states that the driver's form filled out at time of today's visit was not accepted by [facility] office in [place]. She is attempting to fax [place] a new form. Or if we have a new form, asking for Dr to please re-do; said it can't show "error"

## 2018-05-18 NOTE — Telephone Encounter (Signed)
Thank you - also mentioned we may have here. Patient would like it mailed.

## 2018-05-18 NOTE — Telephone Encounter (Signed)
I have one she does not need to fax it will you let her know she can pick it up now, or I can mail it ?

## 2018-05-18 NOTE — Progress Notes (Signed)
Chief Complaint  Patient presents with  . Routine Post Op    bilateral total hip replacements left 02/17/18 right 12/30/17 improving no pain     The patient had 2 insufficiency fractures one in each hip 3 months after left hip surgery 5 months after right hip surgery complains of no pain but says she is walking like a duck  She has excellent abductor function on the table.  Her leg lengths are equal.  Not explained why she is walking that way at this time recommend continue exercises come back in May for x-rays of both hips  Encounter Diagnoses  Name Primary?  . Status post total hip replacement, left 02/17/18 Yes  . S/P hip replacement, right 12/30/17

## 2018-05-23 ENCOUNTER — Telehealth: Payer: Self-pay | Admitting: Nurse Practitioner

## 2018-05-23 NOTE — Telephone Encounter (Signed)
Pt is needing to schedule dental apt and in order for her to do so she needs MMM to send in antibiotic to the pharmacy   [facility]

## 2018-05-24 NOTE — Telephone Encounter (Signed)
lmtcb

## 2018-05-24 NOTE — Telephone Encounter (Signed)
What antibiotic does she want

## 2018-05-31 NOTE — Telephone Encounter (Signed)
lmtcb - no call back- this encounter will be closed.

## 2018-06-01 ENCOUNTER — Other Ambulatory Visit: Payer: Self-pay | Admitting: *Deleted

## 2018-06-01 DIAGNOSIS — D508 Other iron deficiency anemias: Secondary | ICD-10-CM

## 2018-06-02 ENCOUNTER — Telehealth: Payer: Self-pay

## 2018-06-02 ENCOUNTER — Other Ambulatory Visit: Payer: Self-pay | Admitting: Gastroenterology

## 2018-06-02 ENCOUNTER — Inpatient Hospital Stay: Payer: PPO | Attending: Oncology

## 2018-06-02 ENCOUNTER — Inpatient Hospital Stay (HOSPITAL_BASED_OUTPATIENT_CLINIC_OR_DEPARTMENT_OTHER): Payer: PPO | Admitting: Oncology

## 2018-06-02 VITALS — BP 116/69 | HR 66 | Temp 98.4°F | Resp 17 | Ht 65.0 in | Wt 90.2 lb

## 2018-06-02 DIAGNOSIS — Z96642 Presence of left artificial hip joint: Secondary | ICD-10-CM | POA: Insufficient documentation

## 2018-06-02 DIAGNOSIS — K922 Gastrointestinal hemorrhage, unspecified: Secondary | ICD-10-CM

## 2018-06-02 DIAGNOSIS — R4702 Dysphasia: Secondary | ICD-10-CM | POA: Diagnosis not present

## 2018-06-02 DIAGNOSIS — D500 Iron deficiency anemia secondary to blood loss (chronic): Secondary | ICD-10-CM | POA: Insufficient documentation

## 2018-06-02 DIAGNOSIS — D508 Other iron deficiency anemias: Secondary | ICD-10-CM

## 2018-06-02 DIAGNOSIS — K311 Adult hypertrophic pyloric stenosis: Secondary | ICD-10-CM | POA: Diagnosis not present

## 2018-06-02 LAB — CBC WITH DIFFERENTIAL (CANCER CENTER ONLY)
Abs Immature Granulocytes: 0.02 10*3/uL (ref 0.00–0.07)
BASOS PCT: 0 %
Basophils Absolute: 0 10*3/uL (ref 0.0–0.1)
EOS ABS: 0.1 10*3/uL (ref 0.0–0.5)
EOS PCT: 2 %
HEMATOCRIT: 38.6 % (ref 36.0–46.0)
HEMOGLOBIN: 11.7 g/dL — AB (ref 12.0–15.0)
Immature Granulocytes: 0 %
LYMPHS PCT: 27 %
Lymphs Abs: 1.4 10*3/uL (ref 0.7–4.0)
MCH: 31.2 pg (ref 26.0–34.0)
MCHC: 30.3 g/dL (ref 30.0–36.0)
MCV: 102.9 fL — AB (ref 80.0–100.0)
MONO ABS: 0.6 10*3/uL (ref 0.1–1.0)
Monocytes Relative: 12 %
Neutro Abs: 3.1 10*3/uL (ref 1.7–7.7)
Neutrophils Relative %: 59 %
Platelet Count: 238 10*3/uL (ref 150–400)
RBC: 3.75 MIL/uL — AB (ref 3.87–5.11)
RDW: 15 % (ref 11.5–15.5)
WBC: 5.2 10*3/uL (ref 4.0–10.5)
nRBC: 0 % (ref 0.0–0.2)

## 2018-06-02 LAB — FERRITIN: FERRITIN: 28 ng/mL (ref 11–307)

## 2018-06-02 NOTE — Telephone Encounter (Signed)
Printed avs and calender of upcoming appointment. Per 10/31 los 

## 2018-06-02 NOTE — Progress Notes (Signed)
  [facility] OFFICE PROGRESS NOTE   Diagnosis: Anemia  INTERVAL HISTORY:   Brittany Hensley returns for a scheduled visit.  She feels well.  She underwent left hip replacement in July.  She has noted increased dysphasia.  She is scheduled to see Dr. Watt Climes later today.  Objective:   Vital signs in last 24 hours:  Blood pressure 116/69, pulse 66, temperature 98.4 F (36.9 C), temperature source Oral, resp. rate 17, height 5\' 5"  (1.651 m), weight 90 lb 3.2 oz (40.9 kg), SpO2 100 %.     Resp: Lungs clear bilaterally Cardio: Regular rate and rhythm GI: No hepatosplenomegaly Vascular: No leg edema  Lab Results:  Lab Results  Component Value Date   WBC 5.2 06/02/2018   HGB 11.7 (L) 06/02/2018   HCT 38.6 06/02/2018   MCV 102.9 (H) 06/02/2018   PLT 238 06/02/2018   NEUTROABS 3.1 06/02/2018    Medications: I have reviewed the patient's current medications.   Assessment/Plan: 1. Chronic anemia secondary to gastrointestinal blood loss and iron deficiency, she last received IV iron 07/01/2016 2. History of kidney stones, followed by Dr. Roni Bread. 3. Gram-negative sepsis and septic shock syndrome April 2008. 4. History of acute renal failure secondary to obstruction and septic shock. 5. History of adrenal insufficiency, no longer on hormone replacement.  6. Questionable allergic to Venofer in June 2010. She reported an elevated blood pressure and "dizziness" on the day following the Venofer therapy. She has tolerated iron dextran without an apparent reaction. 7. History of a left forearm fracture.  8. Intermittent low-serum bicarbonate level, ? related to diarrhea or renal insufficiency 9. Dysphagia, improved with erythromycin and esophageal dilatation procedures , status post dilatation of the gastric jejunal anastomosis 08/10/2014,02/15/2015, 11/12/2015, 08/10/2016, 05/20/2017 10. Chest x-ray 05/21/2017-COPD changes with questionable nodular density inferior left chest. CT  chest scheduled 06/07/2017. 11. Right hip arthroplasty May 2019, left hip arthroplasty July 2019    Disposition: Ms. Summerfield is stable from a hematologic standpoint.  We will follow-up on the ferritin level from today.  She will return for a lab visit in 4 months in 8 months.  She will return for an office visit in 1 year.  We will administer IV iron as needed. She will see Dr. Watt Climes later today for evaluation of the dysphasia.  15 minutes were spent with the patient today.  The majority of the time was used for counseling and coordination of care.  Betsy Coder, MD  06/02/2018  8:20 AM

## 2018-06-03 ENCOUNTER — Telehealth: Payer: Self-pay | Admitting: *Deleted

## 2018-06-03 NOTE — Telephone Encounter (Signed)
-----   Message from Ladell Pier, MD sent at 06/02/2018  4:49 PM EDT ----- Please call patient, ferritin is in low normal range, repeat ferritin and cbc 2 months, we will give iv iron if lower

## 2018-06-03 NOTE — Telephone Encounter (Signed)
Patient asking to have iv iron infusion now. In past she has had transfusion when ferritin was < 50. Dr. Benay Spice agreed to her request. Scheduling message sent for IV iron week of 11/4 or 11/11 (not on 11/11 due to EGD procedure).

## 2018-06-03 NOTE — Telephone Encounter (Signed)
Left VM for Neftali to return call re: lab results and next steps.

## 2018-06-06 ENCOUNTER — Telehealth: Payer: Self-pay | Admitting: *Deleted

## 2018-06-06 ENCOUNTER — Telehealth: Payer: Self-pay | Admitting: Oncology

## 2018-06-06 NOTE — Telephone Encounter (Signed)
Scheduled appt per 11/4 sch message - left message for patient with appt date and time.   

## 2018-06-06 NOTE — Telephone Encounter (Signed)
Patient called to inquire if her IV iron dextran can be given at [facility]. Confirmed with Lupita Raider, AD that they are only able to administer IV iron if the patient agrees to transfer care there and see provider at [facility]. Patient does not wish to do this and will have iron in [place]. Message to scheduler for 1st available except not on 11/11 due to procedure. She is not able to drive after dark and requests early am appointment.

## 2018-06-08 ENCOUNTER — Encounter: Payer: Self-pay | Admitting: Physician Assistant

## 2018-06-08 ENCOUNTER — Ambulatory Visit (INDEPENDENT_AMBULATORY_CARE_PROVIDER_SITE_OTHER): Payer: PPO | Admitting: Physician Assistant

## 2018-06-08 VITALS — BP 108/67 | HR 85 | Temp 97.6°F | Ht 65.0 in | Wt 92.6 lb

## 2018-06-08 DIAGNOSIS — H6123 Impacted cerumen, bilateral: Secondary | ICD-10-CM | POA: Diagnosis not present

## 2018-06-09 NOTE — Progress Notes (Signed)
BP 108/67   Pulse 85   Temp 97.6 F (36.4 C) (Oral)   Ht 5\' 5"  (1.651 m)   Wt 92 lb 9.6 oz (42 kg)   BMI 15.41 kg/m    Subjective:    Patient ID: Brittany Hensley, female    DOB: 01-31-1951, 67 y.o.   MRN: [id number]  HPI: Brittany Hensley is a 67 y.o. female presenting on 06/08/2018 for decreased hearing due to wax  Bilateral ear canals are impacted with cerumen.  We have discussed ways to try to keep this out in the future.  She notes that she has had decreased hearing.  Ear lavage was successful in the right ear.  The left ear left a small amount in the distal part of the canal.  Past Medical History:  Diagnosis Date  . Anemia    iron   . Anxiety   . Diverticulitis    Hx: of  . GERD (gastroesophageal reflux disease)    at times  . History of kidney stones    10 years ago  . Hypothyroidism   . Low iron   . Pneumonia   . Rosacea    Hx: of   Relevant past medical, surgical, family and social history reviewed and updated as indicated. Interim medical history since our last visit reviewed. Allergies and medications reviewed and updated. DATA REVIEWED: CHART IN EPIC  Family History reviewed for pertinent findings.  Review of Systems  Constitutional: Negative.   HENT: Negative.   Eyes: Negative.   Respiratory: Negative.   Gastrointestinal: Negative.   Genitourinary: Negative.     Allergies as of 06/08/2018      Reactions   Augmentin [amoxicillin-pot Clavulanate] Other (See Comments)   Bloody stool Has patient had a PCN reaction causing immediate rash, facial/tongue/throat swelling, SOB or lightheadedness with hypotension: No Has patient had a PCN reaction causing severe rash involving mucus membranes or skin necrosis: No Has patient had a PCN reaction that required hospitalization: No Has patient had a PCN reaction occurring within the last 10 years: Yes If all of the above answers are "NO", then may proceed with Cephalosporin use.   Famotidine Other (See  Comments)   Fever   Klonopin [clonazepam] Other (See Comments)   REACTION: double vision   Reglan [metoclopramide]    Causes confusion, anxiety   Nitrofurantoin Rash   Sulfamethoxazole-trimethoprim Rash      Medication List        Accurate as of 06/08/18 11:59 PM. Always use your most recent med list.          acetaminophen 500 MG tablet Commonly known as:  TYLENOL Take 500 mg by mouth 2 (two) times daily.   ALPRAZolam 0.5 MG tablet Commonly known as:  XANAX Take 1 tablet (0.5 mg total) by mouth 4 (four) times daily as needed for anxiety.   aspirin 325 MG EC tablet Take 1 tablet (325 mg total) by mouth 2 (two) times daily.   CALCIUM 600 PO Take 600 mg by mouth 2 (two) times daily.   CENTRUM SILVER Chew Chew 1 tablet by mouth 2 (two) times daily.   docusate sodium 100 MG capsule Commonly known as:  COLACE Take 1 capsule (100 mg total) by mouth 2 (two) times daily.   Flaxseed Oil 1200 MG Caps Take 1,200 mg by mouth daily with lunch.   levothyroxine 100 MCG tablet Commonly known as:  SYNTHROID, LEVOTHROID Take 1 tablet (100 mcg total) by mouth every morning.  loratadine 10 MG tablet Commonly known as:  CLARITIN Take 10 mg by mouth 2 (two) times daily.   omeprazole 40 MG capsule Commonly known as:  PRILOSEC Take 1 capsule (40 mg total) by mouth daily.   PROBIOTIC PO Take 1 capsule by mouth daily with lunch.   sertraline 100 MG tablet Commonly known as:  ZOLOFT Take 1 tablet (100 mg total) by mouth at bedtime.   sucralfate 1 g tablet Commonly known as:  CARAFATE TAKE ONE (1) TABLET THREE (3) TIMES EACH DAY   Vitamin C 500 MG Chew Chew 500 mg by mouth 2 (two) times daily.   Vitamin D 50 MCG (2000 UT) tablet Take 2,000 Units by mouth 2 (two) times daily.          Objective:    BP 108/67   Pulse 85   Temp 97.6 F (36.4 C) (Oral)   Ht 5\' 5"  (1.651 m)   Wt 92 lb 9.6 oz (42 kg)   BMI 15.41 kg/m   Allergies  Allergen Reactions  . Augmentin  [Amoxicillin-Pot Clavulanate] Other (See Comments)    Bloody stool Has patient had a PCN reaction causing immediate rash, facial/tongue/throat swelling, SOB or lightheadedness with hypotension: No Has patient had a PCN reaction causing severe rash involving mucus membranes or skin necrosis: No Has patient had a PCN reaction that required hospitalization: No Has patient had a PCN reaction occurring within the last 10 years: Yes If all of the above answers are "NO", then may proceed with Cephalosporin use.   . Famotidine Other (See Comments)    Fever   . Klonopin [Clonazepam] Other (See Comments)    REACTION: double vision  . Reglan [Metoclopramide]     Causes confusion, anxiety  . Nitrofurantoin Rash  . Sulfamethoxazole-Trimethoprim Rash    Wt Readings from Last 3 Encounters:  06/08/18 92 lb 9.6 oz (42 kg)  06/02/18 90 lb 3.2 oz (40.9 kg)  05/18/18 93 lb (42.2 kg)    Physical Exam  Constitutional: She is oriented to person, place, and time. She appears well-developed and well-nourished.  HENT:  Head: Normocephalic and atraumatic.  Bilateral impacted cerumen of the EAC  Eyes: Pupils are equal, round, and reactive to light. Conjunctivae and EOM are normal.  Cardiovascular: Normal rate and regular rhythm.  Pulmonary/Chest: Effort normal and breath sounds normal.  Neurological: She is alert and oriented to person, place, and time. She has normal reflexes.  Skin: Skin is warm and dry. No rash noted.  Psychiatric: She has a normal mood and affect. Her behavior is normal. Judgment and thought content normal.        Assessment & Plan:   1. Impacted cerumen of both ears Successful irrigation of the right ear, left ear with partial success   Continue all other maintenance medications as listed above.  Follow up plan: No follow-ups on file.  Educational handout given for [place] PA-C [place] [address]  [place], [place]  [address] [phone]   06/09/2018, 10:19 AM

## 2018-06-10 ENCOUNTER — Inpatient Hospital Stay: Payer: PPO | Attending: Oncology

## 2018-06-10 ENCOUNTER — Other Ambulatory Visit: Payer: Self-pay | Admitting: Gastroenterology

## 2018-06-10 ENCOUNTER — Other Ambulatory Visit: Payer: Self-pay | Admitting: Oncology

## 2018-06-10 VITALS — BP 115/66 | HR 75 | Temp 98.2°F | Resp 16

## 2018-06-10 DIAGNOSIS — K922 Gastrointestinal hemorrhage, unspecified: Secondary | ICD-10-CM | POA: Insufficient documentation

## 2018-06-10 DIAGNOSIS — Z79899 Other long term (current) drug therapy: Secondary | ICD-10-CM | POA: Insufficient documentation

## 2018-06-10 DIAGNOSIS — D508 Other iron deficiency anemias: Secondary | ICD-10-CM

## 2018-06-10 DIAGNOSIS — D500 Iron deficiency anemia secondary to blood loss (chronic): Secondary | ICD-10-CM | POA: Diagnosis not present

## 2018-06-10 MED ORDER — SODIUM CHLORIDE 0.9 % IV SOLN
1500.0000 mg | Freq: Once | INTRAVENOUS | Status: AC
  Administered 2018-06-10: 1500 mg via INTRAVENOUS
  Filled 2018-06-10: qty 30

## 2018-06-10 MED ORDER — SODIUM CHLORIDE 0.9 % IV SOLN
Freq: Once | INTRAVENOUS | Status: AC
  Filled 2018-06-10: qty 250

## 2018-06-10 MED ORDER — ACETAMINOPHEN 325 MG PO TABS
ORAL_TABLET | ORAL | Status: AC
  Filled 2018-06-10: qty 2

## 2018-06-10 MED ORDER — DIPHENHYDRAMINE HCL 25 MG PO TABS
50.0000 mg | ORAL_TABLET | Freq: Once | ORAL | Status: AC
  Administered 2018-06-10: 50 mg via ORAL
  Filled 2018-06-10: qty 2

## 2018-06-10 MED ORDER — SODIUM CHLORIDE 0.9 % IV SOLN
25.0000 mg | Freq: Once | INTRAVENOUS | Status: AC
  Administered 2018-06-10: 25 mg via INTRAVENOUS
  Filled 2018-06-10: qty 0.5

## 2018-06-10 MED ORDER — DIPHENHYDRAMINE HCL 25 MG PO CAPS
ORAL_CAPSULE | ORAL | Status: AC
  Filled 2018-06-10: qty 2

## 2018-06-10 MED ORDER — SODIUM CHLORIDE 0.9 % IV SOLN
INTRAVENOUS | Status: DC
  Administered 2018-06-10: 08:00:00 via INTRAVENOUS
  Filled 2018-06-10: qty 250

## 2018-06-10 MED ORDER — ACETAMINOPHEN 325 MG PO TABS
650.0000 mg | ORAL_TABLET | Freq: Once | ORAL | Status: AC
  Administered 2018-06-10: 650 mg via ORAL

## 2018-06-10 NOTE — Patient Instructions (Signed)
Iron Dextran injection (Infed) What is this medicine? IRON DEXTRAN (AHY ern DEX tran) is an iron complex. Iron is used to make healthy red blood cells, which carry oxygen and nutrients through the body. This medicine is used to treat people who cannot take iron by mouth and have low levels of iron in the blood. This medicine may be used for other purposes; ask your health care provider or pharmacist if you have questions. COMMON BRAND NAME(S): Dexferrum, INFeD What should I tell my health care provider before I take this medicine? They need to know if you have any of these conditions: -anemia not caused by low iron levels -heart disease -high levels of iron in the blood -kidney disease -liver disease -an unusual or allergic reaction to iron, other medicines, foods, dyes, or preservatives -pregnant or trying to get pregnant -breast-feeding How should I use this medicine? This medicine is for injection into a vein or a muscle. It is given by a health care professional in a hospital or clinic setting. Talk to your pediatrician regarding the use of this medicine in children. While this drug may be prescribed for children as young as 28 months old for selected conditions, precautions do apply. Overdosage: If you think you have taken too much of this medicine contact a poison control center or emergency room at once. NOTE: This medicine is only for you. Do not share this medicine with others. What if I miss a dose? It is important not to miss your dose. Call your doctor or health care professional if you are unable to keep an appointment. What may interact with this medicine? Do not take this medicine with any of the following medications: -deferoxamine -dimercaprol -other iron products This medicine may also interact with the following medications: -chloramphenicol -deferasirox This list may not describe all possible interactions. Give your health care provider a list of all the medicines,  herbs, non-prescription drugs, or dietary supplements you use. Also tell them if you smoke, drink alcohol, or use illegal drugs. Some items may interact with your medicine. What should I watch for while using this medicine? Visit your doctor or health care professional regularly. Tell your doctor if your symptoms do not start to get better or if they get worse. You may need blood work done while you are taking this medicine. You may need to follow a special diet. Talk to your doctor. Foods that contain iron include: whole grains/cereals, dried fruits, beans, or peas, leafy green vegetables, and organ meats (liver, kidney). Long-term use of this medicine may increase your risk of some cancers. Talk to your doctor about how to limit your risk. What side effects may I notice from receiving this medicine? Side effects that you should report to your doctor or health care professional as soon as possible: -allergic reactions like skin rash, itching or hives, swelling of the face, lips, or tongue -blue lips, nails, or skin -breathing problems -changes in blood pressure -chest pain -confusion -fast, irregular heartbeat -feeling faint or lightheaded, falls -fever or chills -flushing, sweating, or hot feelings -joint or muscle aches or pains -pain, tingling, numbness in the hands or feet -seizures -unusually weak or tired Side effects that usually do not require medical attention (report to your doctor or health care professional if they continue or are bothersome): -change in taste (metallic taste) -diarrhea -headache -irritation at site where injected -nausea, vomiting -stomach upset This list may not describe all possible side effects. Call your doctor for medical advice about side  effects. You may report side effects to FDA at 1-800-FDA-1088. Where should I keep my medicine? This drug is given in a hospital or clinic and will not be stored at home. NOTE: This sheet is a summary. It may not  cover all possible information. If you have questions about this medicine, talk to your doctor, pharmacist, or health care provider.  2018 Elsevier/Gold Standard (2007-12-06 16:59:50)

## 2018-06-12 ENCOUNTER — Encounter (HOSPITAL_COMMUNITY): Payer: Self-pay | Admitting: Emergency Medicine

## 2018-06-12 ENCOUNTER — Other Ambulatory Visit: Payer: Self-pay

## 2018-06-12 ENCOUNTER — Emergency Department (HOSPITAL_COMMUNITY): Payer: PPO

## 2018-06-12 ENCOUNTER — Inpatient Hospital Stay (HOSPITAL_COMMUNITY)
Admission: EM | Admit: 2018-06-12 | Discharge: 2018-06-15 | DRG: 177 | Disposition: A | Discharge status: Home / Self Care | Payer: PPO | Attending: Internal Medicine | Admitting: Internal Medicine

## 2018-06-12 DIAGNOSIS — I509 Heart failure, unspecified: Secondary | ICD-10-CM | POA: Diagnosis not present

## 2018-06-12 DIAGNOSIS — K219 Gastro-esophageal reflux disease without esophagitis: Secondary | ICD-10-CM | POA: Diagnosis present

## 2018-06-12 DIAGNOSIS — J9601 Acute respiratory failure with hypoxia: Secondary | ICD-10-CM | POA: Diagnosis not present

## 2018-06-12 DIAGNOSIS — Z79899 Other long term (current) drug therapy: Secondary | ICD-10-CM | POA: Diagnosis not present

## 2018-06-12 DIAGNOSIS — R0902 Hypoxemia: Secondary | ICD-10-CM | POA: Diagnosis present

## 2018-06-12 DIAGNOSIS — L89152 Pressure ulcer of sacral region, stage 2: Secondary | ICD-10-CM | POA: Diagnosis present

## 2018-06-12 DIAGNOSIS — Z79890 Hormone replacement therapy (postmenopausal): Secondary | ICD-10-CM

## 2018-06-12 DIAGNOSIS — E43 Unspecified severe protein-calorie malnutrition: Secondary | ICD-10-CM | POA: Diagnosis not present

## 2018-06-12 DIAGNOSIS — J189 Pneumonia, unspecified organism: Secondary | ICD-10-CM

## 2018-06-12 DIAGNOSIS — J9600 Acute respiratory failure, unspecified whether with hypoxia or hypercapnia: Secondary | ICD-10-CM | POA: Diagnosis not present

## 2018-06-12 DIAGNOSIS — J690 Pneumonitis due to inhalation of food and vomit: Secondary | ICD-10-CM | POA: Diagnosis not present

## 2018-06-12 DIAGNOSIS — R55 Syncope and collapse: Secondary | ICD-10-CM | POA: Diagnosis not present

## 2018-06-12 DIAGNOSIS — I5031 Acute diastolic (congestive) heart failure: Secondary | ICD-10-CM | POA: Diagnosis not present

## 2018-06-12 DIAGNOSIS — Z96643 Presence of artificial hip joint, bilateral: Secondary | ICD-10-CM | POA: Diagnosis not present

## 2018-06-12 DIAGNOSIS — J181 Lobar pneumonia, unspecified organism: Secondary | ICD-10-CM | POA: Diagnosis not present

## 2018-06-12 DIAGNOSIS — R0989 Other specified symptoms and signs involving the circulatory and respiratory systems: Secondary | ICD-10-CM | POA: Diagnosis not present

## 2018-06-12 DIAGNOSIS — E876 Hypokalemia: Secondary | ICD-10-CM | POA: Diagnosis present

## 2018-06-12 DIAGNOSIS — E039 Hypothyroidism, unspecified: Secondary | ICD-10-CM | POA: Diagnosis present

## 2018-06-12 DIAGNOSIS — Z833 Family history of diabetes mellitus: Secondary | ICD-10-CM

## 2018-06-12 DIAGNOSIS — I5030 Unspecified diastolic (congestive) heart failure: Secondary | ICD-10-CM | POA: Diagnosis not present

## 2018-06-12 DIAGNOSIS — Z7982 Long term (current) use of aspirin: Secondary | ICD-10-CM | POA: Diagnosis not present

## 2018-06-12 DIAGNOSIS — F419 Anxiety disorder, unspecified: Secondary | ICD-10-CM | POA: Diagnosis present

## 2018-06-12 DIAGNOSIS — R0602 Shortness of breath: Secondary | ICD-10-CM | POA: Diagnosis not present

## 2018-06-12 LAB — COMPREHENSIVE METABOLIC PANEL
ALBUMIN: 3.6 g/dL (ref 3.5–5.0)
ALK PHOS: 108 U/L (ref 38–126)
ALT: 42 U/L (ref 0–44)
AST: 50 U/L — AB (ref 15–41)
Anion gap: 8 (ref 5–15)
BILIRUBIN TOTAL: 1 mg/dL (ref 0.3–1.2)
BUN: 22 mg/dL (ref 8–23)
CO2: 20 mmol/L — ABNORMAL LOW (ref 22–32)
CREATININE: 0.81 mg/dL (ref 0.44–1.00)
Calcium: 8.7 mg/dL — ABNORMAL LOW (ref 8.9–10.3)
Chloride: 115 mmol/L — ABNORMAL HIGH (ref 98–111)
GFR calc Af Amer: >60 mL/min (ref >60)
GLUCOSE: 121 mg/dL — AB (ref 70–99)
POTASSIUM: 3.9 mmol/L (ref 3.5–5.1)
Sodium: 143 mmol/L (ref 135–145)
TOTAL PROTEIN: 6.6 g/dL (ref 6.5–8.1)

## 2018-06-12 LAB — URINALYSIS, ROUTINE W REFLEX MICROSCOPIC
BACTERIA UA: NONE SEEN
BILIRUBIN URINE: NEGATIVE
Glucose, UA: NEGATIVE mg/dL
KETONES UR: NEGATIVE mg/dL
LEUKOCYTES UA: NEGATIVE
NITRITE: NEGATIVE
PH: 6 (ref 5.0–8.0)
Protein, ur: 100 mg/dL — AB
RBC / HPF: >50 RBC/hpf — ABNORMAL HIGH (ref 0–5)
SPECIFIC GRAVITY, URINE: 1.015 (ref 1.005–1.030)

## 2018-06-12 LAB — INFLUENZA PANEL BY PCR (TYPE A & B)
Influenza A By PCR: NEGATIVE
Influenza B By PCR: NEGATIVE

## 2018-06-12 LAB — I-STAT CG4 LACTIC ACID, ED
Lactic Acid, Venous: 1.88 mmol/L (ref 0.5–1.9)
Lactic Acid, Venous: 3.65 mmol/L (ref 0.5–1.9)

## 2018-06-12 LAB — CBC WITH DIFFERENTIAL/PLATELET
ABS IMMATURE GRANULOCYTES: 0.07 10*3/uL (ref 0.00–0.07)
Basophils Absolute: 0.1 10*3/uL (ref 0.0–0.1)
Basophils Relative: 1 %
EOS ABS: 0.1 10*3/uL (ref 0.0–0.5)
EOS PCT: 1 %
HEMATOCRIT: 44.8 % (ref 36.0–46.0)
HEMOGLOBIN: 13.2 g/dL (ref 12.0–15.0)
Immature Granulocytes: 1 %
LYMPHS ABS: 0.3 10*3/uL — AB (ref 0.7–4.0)
LYMPHS PCT: 3 %
MCH: 30.6 pg (ref 26.0–34.0)
MCHC: 29.5 g/dL — ABNORMAL LOW (ref 30.0–36.0)
MCV: 103.9 fL — ABNORMAL HIGH (ref 80.0–100.0)
MONOS PCT: 11 %
Monocytes Absolute: 1.1 10*3/uL — ABNORMAL HIGH (ref 0.1–1.0)
Neutro Abs: 7.7 10*3/uL (ref 1.7–7.7)
Neutrophils Relative %: 83 %
PLATELETS: 273 10*3/uL (ref 150–400)
RBC: 4.31 MIL/uL (ref 3.87–5.11)
RDW: 15.1 % (ref 11.5–15.5)
WBC: 9.4 10*3/uL (ref 4.0–10.5)
nRBC: 0 % (ref 0.0–0.2)

## 2018-06-12 LAB — TROPONIN I
Troponin I: 0.03 ng/mL (ref <0.03)
Troponin I: 0.03 ng/mL (ref <0.03)

## 2018-06-12 LAB — CBG MONITORING, ED: Glucose-Capillary: 119 mg/dL — ABNORMAL HIGH (ref 70–99)

## 2018-06-12 LAB — PROTIME-INR
INR: 1.17
Prothrombin Time: 14.8 seconds (ref 11.4–15.2)

## 2018-06-12 LAB — BRAIN NATRIURETIC PEPTIDE: B NATRIURETIC PEPTIDE 5: 579 pg/mL — AB (ref 0.0–100.0)

## 2018-06-12 LAB — PROCALCITONIN: Procalcitonin: 6.47 ng/mL

## 2018-06-12 MED ORDER — IPRATROPIUM-ALBUTEROL 0.5-2.5 (3) MG/3ML IN SOLN
3.0000 mL | Freq: Four times a day (QID) | RESPIRATORY_TRACT | Status: DC
  Administered 2018-06-12 – 2018-06-13 (×2): 3 mL via RESPIRATORY_TRACT
  Filled 2018-06-12 (×2): qty 3

## 2018-06-12 MED ORDER — FUROSEMIDE 10 MG/ML IJ SOLN
20.0000 mg | Freq: Once | INTRAMUSCULAR | Status: AC
  Administered 2018-06-12: 20 mg via INTRAVENOUS
  Filled 2018-06-12: qty 2

## 2018-06-12 MED ORDER — LEVOFLOXACIN IN D5W 750 MG/150ML IV SOLN: 750.0000 mg | INTRAVENOUS | Status: DC

## 2018-06-12 MED ORDER — SODIUM CHLORIDE 0.9 % IV SOLN
INTRAVENOUS | Status: DC
  Administered 2018-06-12: 10:00:00 via INTRAVENOUS

## 2018-06-12 MED ORDER — ONDANSETRON HCL 4 MG/2ML IJ SOLN
4.0000 mg | Freq: Four times a day (QID) | INTRAMUSCULAR | Status: DC | PRN
  Administered 2018-06-13: 4 mg via INTRAVENOUS
  Filled 2018-06-12: qty 2

## 2018-06-12 MED ORDER — SODIUM CHLORIDE 0.9 % IV SOLN
2.0000 g | INTRAVENOUS | Status: AC
  Administered 2018-06-12 – 2018-06-14 (×3): 2 g via INTRAVENOUS
  Filled 2018-06-12 (×3): qty 2

## 2018-06-12 MED ORDER — ONDANSETRON HCL 4 MG PO TABS: 4.0000 mg | ORAL_TABLET | Freq: Four times a day (QID) | ORAL | Status: DC | PRN

## 2018-06-12 MED ORDER — BUDESONIDE 0.5 MG/2ML IN SUSP
0.5000 mg | Freq: Two times a day (BID) | RESPIRATORY_TRACT | Status: DC
  Administered 2018-06-12 – 2018-06-15 (×6): 0.5 mg via RESPIRATORY_TRACT
  Filled 2018-06-12 (×8): qty 2

## 2018-06-12 MED ORDER — SODIUM CHLORIDE 0.9 % IV SOLN
500.0000 mg | INTRAVENOUS | Status: AC
  Administered 2018-06-12 – 2018-06-14 (×3): 500 mg via INTRAVENOUS
  Filled 2018-06-12 (×4): qty 500

## 2018-06-12 MED ORDER — ALBUTEROL (5 MG/ML) CONTINUOUS INHALATION SOLN
10.0000 mg/h | INHALATION_SOLUTION | Freq: Once | RESPIRATORY_TRACT | Status: AC
  Administered 2018-06-12: 10 mg/h via RESPIRATORY_TRACT
  Filled 2018-06-12: qty 20

## 2018-06-12 MED ORDER — METHYLPREDNISOLONE SODIUM SUCC 125 MG IJ SOLR
60.0000 mg | Freq: Four times a day (QID) | INTRAMUSCULAR | Status: DC
  Administered 2018-06-12 – 2018-06-13 (×3): 60 mg via INTRAVENOUS
  Filled 2018-06-12 (×4): qty 2

## 2018-06-12 MED ORDER — VANCOMYCIN HCL IN DEXTROSE 1-5 GM/200ML-% IV SOLN
1000.0000 mg | INTRAVENOUS | Status: DC
  Administered 2018-06-12 – 2018-06-15 (×4): 1000 mg via INTRAVENOUS
  Filled 2018-06-12 (×3): qty 200

## 2018-06-12 MED ORDER — ACETAMINOPHEN 650 MG RE SUPP: 650.0000 mg | Freq: Four times a day (QID) | RECTAL | Status: DC | PRN

## 2018-06-12 MED ORDER — METHYLPREDNISOLONE SODIUM SUCC 125 MG IJ SOLR
125.0000 mg | Freq: Once | INTRAMUSCULAR | Status: AC
  Administered 2018-06-12: 125 mg via INTRAVENOUS
  Filled 2018-06-12: qty 2

## 2018-06-12 MED ORDER — SODIUM CHLORIDE 0.9 % IV BOLUS
500.0000 mL | Freq: Once | INTRAVENOUS | Status: AC
  Administered 2018-06-12: 500 mL via INTRAVENOUS

## 2018-06-12 MED ORDER — LEVOTHYROXINE SODIUM 100 MCG IV SOLR
50.0000 ug | Freq: Every day | INTRAVENOUS | Status: DC
  Administered 2018-06-13: 50 ug via INTRAVENOUS
  Filled 2018-06-12 (×3): qty 5

## 2018-06-12 MED ORDER — SERTRALINE HCL 50 MG PO TABS
100.0000 mg | ORAL_TABLET | Freq: Every day | ORAL | Status: DC
  Administered 2018-06-13 – 2018-06-14 (×2): 100 mg via ORAL
  Filled 2018-06-12 (×2): qty 2

## 2018-06-12 MED ORDER — ENOXAPARIN SODIUM 40 MG/0.4ML ~~LOC~~ SOLN
40.0000 mg | SUBCUTANEOUS | Status: DC
  Administered 2018-06-12: 40 mg via SUBCUTANEOUS
  Filled 2018-06-12: qty 0.4

## 2018-06-12 MED ORDER — LEVOFLOXACIN IN D5W 750 MG/150ML IV SOLN
750.0000 mg | INTRAVENOUS | Status: DC
  Administered 2018-06-12: 750 mg via INTRAVENOUS
  Filled 2018-06-12: qty 150

## 2018-06-12 MED ORDER — LORAZEPAM 2 MG/ML IJ SOLN
0.5000 mg | INTRAMUSCULAR | Status: DC | PRN
  Administered 2018-06-12 – 2018-06-13 (×4): 0.5 mg via INTRAVENOUS
  Filled 2018-06-12 (×3): qty 1

## 2018-06-12 MED ORDER — ACETAMINOPHEN 325 MG PO TABS
650.0000 mg | ORAL_TABLET | Freq: Four times a day (QID) | ORAL | Status: DC | PRN
  Administered 2018-06-13 – 2018-06-15 (×6): 650 mg via ORAL
  Filled 2018-06-12 (×6): qty 2

## 2018-06-12 MED ORDER — IPRATROPIUM BROMIDE 0.02 % IN SOLN
1.0000 mg | Freq: Once | RESPIRATORY_TRACT | Status: AC
  Administered 2018-06-12: 1 mg via RESPIRATORY_TRACT
  Filled 2018-06-12: qty 5

## 2018-06-12 NOTE — Progress Notes (Signed)
Pharmacy Antibiotic Note  Brittany Hensley is a 67 y.o. female admitted on 06/12/2018 with pneumonia.  Pharmacy has been consulted for Vancomycin and Cefepime dosing.  Plan: Vancomycin 1000mg   IV every 24 hours.  Goal trough 15-20 mcg/mL.  Cefepime 2gm IV q24h Also will be on azithromycin 500mg  IV q24h  F/U cxs and clinical progress Monitor V/S, labs, and levels as indicated  Height: 5\' 5"  (165.1 cm) Weight: 90 lb (40.8 kg) IBW/kg (Calculated) : 57  Temp (24hrs), Avg:97.5 F (36.4 C), Min:97.5 F (36.4 C), Max:97.5 F (36.4 C)  Recent Labs  Lab 06/12/18 0944 06/12/18 1000 06/12/18 1245  WBC 9.4  --   --   CREATININE 0.81  --   --   LATICACIDVEN  --  1.88 3.65*    Estimated Creatinine Clearance: 42.8 mL/min (by C-G formula based on SCr of 0.81 mg/dL).    Allergies  Allergen Reactions  . Augmentin [Amoxicillin-Pot Clavulanate] Other (See Comments)    Bloody stool Has patient had a PCN reaction causing immediate rash, facial/tongue/throat swelling, SOB or lightheadedness with hypotension: No Has patient had a PCN reaction causing severe rash involving mucus membranes or skin necrosis: No Has patient had a PCN reaction that required hospitalization: No Has patient had a PCN reaction occurring within the last 10 years: Yes If all of the above answers are "NO", then may proceed with Cephalosporin use.   . Famotidine Other (See Comments)    Fever   . Klonopin [Clonazepam] Other (See Comments)    REACTION: double vision  . Reglan [Metoclopramide]     Causes confusion, anxiety  . Nitrofurantoin Rash  . Sulfamethoxazole-Trimethoprim Rash    Antimicrobials this admission: Vancomycin 11/10 >> Cefepime 11/10 >>  Azithromycin 11/11>> Levaquin 750mg  IV x 1 dose in ED 11/10  Dose adjustments this admission: n/a  Microbiology results: 11/10 BCx: pending 11/10 UCx: pending  MRSA PCR:   Thank you for allowing pharmacy to be a part of this patient's care.  Isac Sarna,  BS [place], [place] Clinical Pharmacist Pager [phone] 06/12/2018 2:48 PM

## 2018-06-12 NOTE — ED Notes (Signed)
MD Thurnell Garbe notified of elevated troponin.

## 2018-06-12 NOTE — H&P (Signed)
History and Physical  Brittany Hensley AST:[id number] DOB: 12-Feb-1951 DOA: 06/12/2018   PCP: Chevis Pretty, FNP   Patient coming from: Home  Chief Complaint: dypsnea  HPI:  Brittany Hensley is a 67 y.o. female with medical history of anxiety, Roux-en-Y gastrojejunostomy with anastomotic stricture, hypothyroidism, and iron deficiency anemia presenting with 2 to 3-day history of increasing shortness of breath, coughing, chest congestion that began on 06/10/2018 after she had an iron infusion.  The patient denied any fevers, chills, chest pain, abdominal pain.  She did have an episode of nausea and vomiting which is common for her after her iron infusions.  She denies any diarrhea, hematochezia, melena.  She has not had any further emesis.  On the morning of 06/12/2018, the patient continued to have worsening shortness of breath and generalized weakness.  As result, the patient was brought to emergency department for further evaluation.  She was noted to have respiratory distress with oxygen saturation of 70 per 2% on room air.  She was placed on BiPAP.  Patient has never smoked in her lifetime, but was exposed to a significant amount of secondhand smoke from her husband. In the emergency department, the patient was afebrile and hemodynamically stable with oxygen saturation of 100% on BiPAP 50%.  She was tachycardic into the 120s.  Chest x-ray showed vascular congestion with possible airspace opacity in the left lower lobe and right middle lobe.  The patient was given furosemide 20 mg IV.  She was started on levofloxacin and vancomycin.  Solu-Medrol IV was also given.  Assessment/Plan: Acute respiratory failure with hypoxia -Multifactorial including pulmonary edema, pneumonia, and possible COPD -Remain on BiPAP -Wean oxygen as tolerated -Pulmonary hygiene -Check influenza  Pulmonary edema -The patient has clinical signs of fluid overload -Give furosemide 20 mg IV x2 -Reassess  clinically 06/13/2018 -Repeat chest x-ray in the morning  Lobar pneumonia -Start cefepime and azithromycin -Check procalcitonin -Follow blood cultures -Lactic acid 1.88 -Start Solu-Medrol -Start duo nebs -continue vancomycin pending cultures  Hypothyroidism  -Start IV levothyroxine until the patient is able to tolerate po  Pyuria -Continue cefepime pending culture data  Anxiety -Start IV lorazepam until the patient is able to tolerate p.o.      Active Problems:   Acute respiratory failure with hypoxia [facility])       Past Medical History:  Diagnosis Date  . Anemia    iron   . Anxiety   . Diverticulitis    Hx: of  . GERD (gastroesophageal reflux disease)    at times  . History of kidney stones    10 years ago  . Hypothyroidism   . Low iron   . Pneumonia   . Rosacea    Hx: of   Past Surgical History:  Procedure Laterality Date  . APPENDECTOMY    . BALLOON DILATION  06/16/2011   Procedure: BALLOON DILATION;  Surgeon: Jeryl Columbia, MD;  Location[facility] ENDOSCOPY;  Service: Endoscopy;  Laterality: N/A;  . BALLOON DILATION N/A 10/03/2013   Procedure: BALLOON DILATION;  Surgeon: Jeryl Columbia, MD;  Location: [facility] ENDOSCOPY;  Service: Endoscopy;  Laterality: N/A;  . BALLOON DILATION N/A 08/10/2014   Procedure: BALLOON DILATION;  Surgeon: Jeryl Columbia, MD;  Location: [facility] ENDOSCOPY;  Service: Endoscopy;  Laterality: N/A;  from 12 - 15 cm dilation completed  . BALLOON DILATION N/A 02/15/2015   Procedure: BALLOON DILATION;  Surgeon: Clarene Essex, MD;  Location: [facility] ENDOSCOPY;  Service: Endoscopy;  Laterality: N/A;  . BALLOON DILATION N/A 11/12/2015   Procedure: BALLOON DILATION;  Surgeon: Clarene Essex, MD;  Location: [facility] ENDOSCOPY;  Service: Endoscopy;  Laterality: N/A;  . BALLOON DILATION N/A 08/10/2016   Procedure: BALLOON DILATION;  Surgeon: Clarene Essex, MD;  Location: [facility] ENDOSCOPY;  Service: Endoscopy;  Laterality: N/A;  . BALLOON DILATION N/A 08/07/2016   Procedure: BALLOON DILATION;   Surgeon: Clarene Essex, MD;  Location: [facility] ENDOSCOPY;  Service: Endoscopy;  Laterality: N/A;  . BALLOON DILATION N/A 05/20/2017   Procedure: BALLOON DILATION;  Surgeon: Clarene Essex, MD;  Location: [facility] ENDOSCOPY;  Service: Endoscopy;  Laterality: N/A;  . BALLOON DILATION N/A 12/02/2017   Procedure: BALLOON DILATION;  Surgeon: Clarene Essex, MD;  Location: [place];  Service: Endoscopy;  Laterality: N/A;  . CATARACT EXTRACTION, BILATERAL    . CHOLECYSTECTOMY OPEN  1979  . COLONOSCOPY     Hx: of  . ESOPHAGOGASTRODUODENOSCOPY  06/16/2011   Procedure: ESOPHAGOGASTRODUODENOSCOPY (EGD);  Surgeon: Jeryl Columbia, MD;  Location: [facility] ENDOSCOPY;  Service: Endoscopy;  Laterality: N/A;  . ESOPHAGOGASTRODUODENOSCOPY N/A 10/03/2013   Procedure: ESOPHAGOGASTRODUODENOSCOPY (EGD);  Surgeon: Jeryl Columbia, MD;  Location: [facility] ENDOSCOPY;  Service: Endoscopy;  Laterality: N/A;  . ESOPHAGOGASTRODUODENOSCOPY N/A 08/10/2014   Procedure: ESOPHAGOGASTRODUODENOSCOPY (EGD);  Surgeon: Jeryl Columbia, MD;  Location: [facility] ENDOSCOPY;  Service: Endoscopy;  Laterality: N/A;  . ESOPHAGOGASTRODUODENOSCOPY N/A 08/10/2016   Procedure: ESOPHAGOGASTRODUODENOSCOPY (EGD);  Surgeon: Clarene Essex, MD;  Location: [facility] ENDOSCOPY;  Service: Endoscopy;  Laterality: N/A;  . ESOPHAGOGASTRODUODENOSCOPY (EGD) WITH PROPOFOL N/A 02/15/2015   Procedure: ESOPHAGOGASTRODUODENOSCOPY (EGD) WITH PROPOFOL;  Surgeon: Clarene Essex, MD;  Location: [facility] ENDOSCOPY;  Service: Endoscopy;  Laterality: N/A;  . ESOPHAGOGASTRODUODENOSCOPY (EGD) WITH PROPOFOL N/A 11/12/2015   Procedure: ESOPHAGOGASTRODUODENOSCOPY (EGD) WITH PROPOFOL;  Surgeon: Clarene Essex, MD;  Location: [facility] ENDOSCOPY;  Service: Endoscopy;  Laterality: N/A;  . ESOPHAGOGASTRODUODENOSCOPY (EGD) WITH PROPOFOL N/A 08/07/2016   Procedure: ESOPHAGOGASTRODUODENOSCOPY (EGD) WITH PROPOFOL;  Surgeon: Clarene Essex, MD;  Location: [facility] ENDOSCOPY;  Service: Endoscopy;  Laterality: N/A;  . ESOPHAGOGASTRODUODENOSCOPY (EGD) WITH PROPOFOL N/A 05/20/2017    Procedure: ESOPHAGOGASTRODUODENOSCOPY (EGD) WITH PROPOFOL;  Surgeon: Clarene Essex, MD;  Location: [facility] ENDOSCOPY;  Service: Endoscopy;  Laterality: N/A;  . ESOPHAGOGASTRODUODENOSCOPY (EGD) WITH PROPOFOL N/A 12/02/2017   Procedure: ESOPHAGOGASTRODUODENOSCOPY (EGD) WITH PROPOFOL;  Surgeon: Clarene Essex, MD;  Location: [place];  Service: Endoscopy;  Laterality: N/A;  have c arm available  . FRACTURE SURGERY     Hx: of left wrist  . GASTRECTOMY  [phone]  . GASTRECTOMY N/A    X3  . LITHOTRIPSY     Hx; of for kidney stones  . SAVORY DILATION N/A 08/10/2014   Procedure: SAVORY DILATION;  Surgeon: Jeryl Columbia, MD;  Location: [facility] ENDOSCOPY;  Service: Endoscopy;  Laterality: N/A;  . TOTAL HIP ARTHROPLASTY Right 12/30/2017   Procedure: RIGHT TOTAL HIP ARTHROPLASTY;  Surgeon: Carole Civil, MD;  Location: [facility];  Service: Orthopedics;  Laterality: Right;  . TOTAL HIP ARTHROPLASTY Left 02/17/2018   Procedure: TOTAL HIP ARTHROPLASTY;  Surgeon: Carole Civil, MD;  Location: [facility];  Service: Orthopedics;  Laterality: Left;  . TUBAL LIGATION     1975   Social History:  reports that she has never smoked. She has never used smokeless tobacco. She reports that she does not drink alcohol or use drugs.   Family History  Problem Relation Age of Onset  . Diabetes Mother   . Cancer - Prostate Brother  Allergies  Allergen Reactions  . Augmentin [Amoxicillin-Pot Clavulanate] Other (See Comments)    Bloody stool Has patient had a PCN reaction causing immediate rash, facial/tongue/throat swelling, SOB or lightheadedness with hypotension: No Has patient had a PCN reaction causing severe rash involving mucus membranes or skin necrosis: No Has patient had a PCN reaction that required hospitalization: No Has patient had a PCN reaction occurring within the last 10 years: Yes If all of the above answers are "NO", then may proceed with Cephalosporin use.   . Famotidine Other (See Comments)      Fever   . Klonopin [Clonazepam] Other (See Comments)    REACTION: double vision  . Reglan [Metoclopramide]     Causes confusion, anxiety  . Nitrofurantoin Rash  . Sulfamethoxazole-Trimethoprim Rash     Prior to Admission medications   Medication Sig Start Date End Date Taking? Authorizing Provider  acetaminophen (TYLENOL) 500 MG tablet Take 500 mg by mouth 2 (two) times daily.   Yes [provider]  ALPRAZolam (XANAX) 0.5 MG tablet Take 1 tablet (0.5 mg total) by mouth 4 (four) times daily as needed for anxiety. 05/06/18  Yes Martin, Mary-Margaret, FNP  Ascorbic Acid (VITAMIN C) 500 MG CHEW Chew 500 mg by mouth 2 (two) times daily.   Yes [provider]  aspirin EC 325 MG EC tablet Take 1 tablet (325 mg total) by mouth 2 (two) times daily. Patient taking differently: Take 325 mg by mouth daily.  02/21/18  Yes Carole Civil, MD  Calcium Carbonate (CALCIUM 600 PO) Take 600 mg by mouth 2 (two) times daily.   Yes [provider]  levothyroxine (SYNTHROID, LEVOTHROID) 100 MCG tablet Take 1 tablet (100 mcg total) by mouth every morning. 05/06/18  Yes Martin, Mary-Margaret, FNP  Cholecalciferol (VITAMIN D) 2000 units tablet Take 2,000 Units by mouth 2 (two) times daily.    [provider]  docusate sodium (COLACE) 100 MG capsule Take 1 capsule (100 mg total) by mouth 2 (two) times daily. Patient taking differently: Take 100 mg by mouth daily.  01/03/18   Carole Civil, MD  Flaxseed, Linseed, (FLAXSEED OIL) 1200 MG CAPS Take 1,200 mg by mouth daily with lunch.    [provider]  loratadine (CLARITIN) 10 MG tablet Take 10 mg by mouth 2 (two) times daily.     [provider]  Multiple Vitamins-Minerals (CENTRUM SILVER) CHEW Chew 1 tablet by mouth 2 (two) times daily.    [provider]  omeprazole (PRILOSEC) 40 MG capsule Take 1 capsule (40 mg total) by mouth daily. 05/06/18   Hassell Done Mary-Margaret, FNP  Probiotic Product  (PROBIOTIC PO) Take 1 capsule by mouth daily with lunch.     [provider]  sertraline (ZOLOFT) 100 MG tablet Take 1 tablet (100 mg total) by mouth at bedtime. 05/06/18   Hassell Done, Mary-Margaret, FNP  sucralfate (CARAFATE) 1 g tablet TAKE ONE (1) TABLET THREE (3) TIMES EACH DAY Patient taking differently: Take 1 g by mouth 3 (three) times daily with meals.  04/28/18   Chevis Pretty, FNP    Review of Systems:  Constitutional:  No weight loss, night sweats, Fevers, chills, fatigue.  Head&Eyes: No headache.  No vision loss.  No eye pain or scotoma ENT:  No Difficulty swallowing,Tooth/dental problems,Sore throat,  No ear ache, post nasal drip,  Cardio-vascular:  No chest pain, Orthopnea, PND, swelling in lower extremities,  dizziness, palpitations  GI:  No  abdominal pain, nausea, vomiting, diarrhea, loss of appetite,  hematochezia, melena, heartburn, indigestion, Resp:  No shortness of breath with exertion or at rest. No cough. No coughing up of blood .No wheezing.No chest wall deformity  Skin:  no rash or lesions.  GU:  no dysuria, change in color of urine, no urgency or frequency. No flank pain.  Musculoskeletal:  No joint pain or swelling. No decreased range of motion. No back pain.  Psych:  No change in mood or affect. No depression or anxiety. Neurologic: No headache, no dysesthesia, no focal weakness, no vision loss. No syncope  Physical Exam: Vitals:   06/12/18 1015 06/12/18 1030 06/12/18 1100 06/12/18 1130  BP:  111/60 103/61 119/67  Pulse: (!) 121 (!) 124 (!) 123 (!) 122  Resp: (!) 28 (!) 29 (!) 28 (!) 26  Temp:      TempSrc:      SpO2: 97% 97% 97% 97%  Weight:      Height:       General:  A&O x 3, NAD, nontoxic, pleasant/cooperative Head/Eye: No conjunctival hemorrhage, no icterus, [place]/AT, No nystagmus ENT:  No icterus,  No thrush, good dentition, no pharyngeal exudate Neck:  No masses, no lymphadenpathy, no bruits CV:  RRR, no rub, no gallop, no  S3 Lung:  CTAB, good air movement, no wheeze, no rhonchi Abdomen: soft/NT, +BS, nondistended, no peritoneal signs Ext: No cyanosis, No rashes, No petechiae, No lymphangitis, No edema Neuro: CNII-XII intact, strength 4/5 in bilateral upper and lower extremities, no dysmetria  Labs on Admission:  Basic Metabolic Panel: Recent Labs  Lab 06/12/18 0944  NA 143  K 3.9  CL 115*  CO2 20*  GLUCOSE 121*  BUN 22  CREATININE 0.81  CALCIUM 8.7*   Liver Function Tests: Recent Labs  Lab 06/12/18 0944  AST 50*  ALT 42  ALKPHOS 108  BILITOT 1.0  PROT 6.6  ALBUMIN 3.6   No results for input(s): LIPASE, AMYLASE in the last 168 hours. No results for input(s): AMMONIA in the last 168 hours. CBC: Recent Labs  Lab 06/12/18 0944  WBC 9.4  NEUTROABS 7.7  HGB 13.2  HCT 44.8  MCV 103.9*  PLT 273   Coagulation Profile: Recent Labs  Lab 06/12/18 0944  INR 1.17   Cardiac Enzymes: Recent Labs  Lab 06/12/18 0944  TROPONINI 0.03*   BNP: Invalid input(s): POCBNP CBG: Recent Labs  Lab 06/12/18 0905  GLUCAP 119*   Urine analysis:    Component Value Date/Time   COLORURINE YELLOW 06/12/2018 0859   APPEARANCEUR HAZY (A) 06/12/2018 0859   LABSPEC 1.015 06/12/2018 0859   PHURINE 6.0 06/12/2018 0859   GLUCOSEU NEGATIVE 06/12/2018 0859   HGBUR LARGE (A) 06/12/2018 0859   BILIRUBINUR NEGATIVE 06/12/2018 0859   BILIRUBINUR neg 03/27/2014 [place] 06/12/2018 0859   PROTEINUR 100 (A) 06/12/2018 0859   UROBILINOGEN negative 03/27/2014 1241   UROBILINOGEN 0.2 02/16/2007 1911   NITRITE NEGATIVE 06/12/2018 0859   LEUKOCYTESUR NEGATIVE 06/12/2018 0859   Sepsis Labs: @LABRCNTIP (procalcitonin:4,lacticidven:4) ) Recent Results (from the past 240 hour(s))  Culture, blood (Routine x 2)     Status: None (Preliminary result)   Collection Time: 06/12/18  9:45 AM  Result Value Ref Range Status   Specimen Description LEFT ANTECUBITAL  Final   Special Requests   Final     BOTTLES DRAWN AEROBIC AND ANAEROBIC Blood Culture adequate volume Performed at [facility], [address] [place],  [address]    Culture PENDING  Incomplete   Report Status PENDING  Incomplete  Culture, blood (Routine x 2)     Status: None (Preliminary result)   Collection Time: 06/12/18  9:45 AM  Result Value Ref Range Status   Specimen Description BLOOD RIGHT WRIST  Final   Special Requests   Final    BOTTLES DRAWN AEROBIC ONLY Blood Culture adequate volume Performed at [facility], [address] [place], [place] [address]    Culture PENDING  Incomplete   Report Status PENDING  Incomplete     Radiological Exams on Admission: Dg Chest Port 1 View  Result Date: 06/12/2018 CLINICAL DATA:  67 year old female with anemia, congestion and sepsis EXAM: PORTABLE CHEST 1 VIEW COMPARISON:  Prior chest x-ray 12/29/2017 FINDINGS: Stable cardiac and mediastinal contours. Increased pulmonary vascular congestion with bilateral perihilar interstitial and airspace opacities. Findings suggest mild CHF. No pleural effusion or pneumothorax. Stable hyperinflation. No acute osseous abnormality. IMPRESSION: 1. Increased pulmonary vascular congestion now with bilateral perihilar, lingular and right middle lobe interstitial and airspace opacities. Overall, the findings are most suggestive of a mild CHF. However, in the appropriate clinical setting multifocal pneumonia involving the lingula and right middle lobe could be considered. 2. Pulmonary hyperinflation and chronic bronchitic changes. Electronically Signed   By: Jacqulynn Cadet M.D.   On: 06/12/2018 10:09    EKG: Independently reviewed. Sinus, nonspecific ST changes    Time spent:60 minutes Code Status:   FULL Family Communication:  Son updated at bedside Disposition Plan: expect 2-3 day hospitalization Consults called: none  DVT Prophylaxis: [place] Lovenox  Orson Eva, DO  [facility] Pager [phone]  If 7PM-7AM, please  contact night-coverage [email or web address] Password [email or web address] 06/12/2018, 11:54 AM

## 2018-06-12 NOTE — ED Provider Notes (Signed)
[facility] EMERGENCY DEPARTMENT Provider Note   CSN: [id number] Arrival date & time: 06/12/18  0847     History   Chief Complaint Chief Complaint  Patient presents with  . Near Syncope  . Cough    HPI Brittany Hensley is a 67 y.o. female.  The history is provided by the patient and a relative. The history is limited by the condition of the patient (urgent need for intervention).  Near Syncope   Cough     Pt was seen at 0905.  Per pt and her family: Pt has been on liquid diet for the past 3 days d/t scheduled esophageal dilatation tomorrow. Pt also s/p iron infusion 2 days ago. Pt has had N/V for the past 2 days with productive cough and "chest congestion." Cough has been productive of "green-yellow thick" sputum. Has been associated with SOB, generalized weakness/fatigue, and near syncope today. On arrival, pt's O2 Sats were 72% R/A. Pt denies fevers, no diarrhea, no black or blood in stools or emesis, no CP/palpitations. Pt has not had her flu shot this season.   Past Medical History:  Diagnosis Date  . Anemia    iron   . Anxiety   . Diverticulitis    Hx: of  . GERD (gastroesophageal reflux disease)    at times  . History of kidney stones    10 years ago  . Hypothyroidism   . Low iron   . Pneumonia   . Rosacea    Hx: of    Patient Active Problem List   Diagnosis Date Noted  . Low iron 03/01/2018  . Age-related osteoporosis with current pathological fracture of right femur ([place]) 03/01/2018  . Status post total hip replacement, left 02/17/18 02/17/2018  . Closed displaced fracture of left femoral neck ([place]) 02/14/2018  . Left displaced femoral neck fracture ([place]) 02/14/2018  . S/P hip replacement, right 12/30/17 01/13/2018  . Pressure injury of skin 01/01/2018  . Closed displaced fracture of right femoral neck ([place]) 12/30/2017  . Hip fracture, right, closed, initial encounter ([place])   . Femur fracture, right ([place]) 12/29/2017  . Abnormal x-ray of lung 05/21/2017    . BMI less than 19,adult 01/10/2016  . GAD (generalized anxiety disorder) 01/10/2016  . 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  . Hypothyroidism 04/24/2013  . GERD (gastroesophageal reflux disease) 04/24/2013  . Rosacea 04/24/2013  . Iron deficiency anemia 06/09/2012  . Gastric outlet obstruction 06/16/2011  . Atrioventricular nodal re-entry tachycardia ([place]) 03/11/2010  . MURMUR 03/11/2010  . PERSONAL HISTORY OF URINARY CALCULI 02/14/2008    Past Surgical History:  Procedure Laterality Date  . APPENDECTOMY    . BALLOON DILATION  06/16/2011   Procedure: BALLOON DILATION;  Surgeon: Jeryl Columbia, MD;  Location: [facility] ENDOSCOPY;  Service: Endoscopy;  Laterality: N/A;  . BALLOON DILATION N/A 10/03/2013   Procedure: BALLOON DILATION;  Surgeon: Jeryl Columbia, MD;  Location: [facility] ENDOSCOPY;  Service: Endoscopy;  Laterality: N/A;  . BALLOON DILATION N/A 08/10/2014   Procedure: BALLOON DILATION;  Surgeon: Jeryl Columbia, MD;  Location: [facility] ENDOSCOPY;  Service: Endoscopy;  Laterality: N/A;  from 12 - 15 cm dilation completed  . BALLOON DILATION N/A 02/15/2015   Procedure: BALLOON DILATION;  Surgeon: Clarene Essex, MD;  Location: [facility] ENDOSCOPY;  Service: Endoscopy;  Laterality: N/A;  . BALLOON DILATION N/A 11/12/2015   Procedure: BALLOON DILATION;  Surgeon: Clarene Essex, MD;  Location: [facility] ENDOSCOPY;  Service: Endoscopy;  Laterality: N/A;  . BALLOON DILATION N/A 08/10/2016  Procedure: BALLOON DILATION;  Surgeon: Clarene Essex, MD;  Location: [facility] ENDOSCOPY;  Service: Endoscopy;  Laterality: N/A;  . BALLOON DILATION N/A 08/07/2016   Procedure: BALLOON DILATION;  Surgeon: Clarene Essex, MD;  Location: [facility] ENDOSCOPY;  Service: Endoscopy;  Laterality: N/A;  . BALLOON DILATION N/A 05/20/2017   Procedure: BALLOON DILATION;  Surgeon: Clarene Essex, MD;  Location: [facility] ENDOSCOPY;  Service: Endoscopy;  Laterality: N/A;  . BALLOON DILATION N/A 12/02/2017   Procedure: BALLOON DILATION;  Surgeon: Clarene Essex, MD;  Location: [place];   Service: Endoscopy;  Laterality: N/A;  . CATARACT EXTRACTION, BILATERAL    . CHOLECYSTECTOMY OPEN  1979  . COLONOSCOPY     Hx: of  . ESOPHAGOGASTRODUODENOSCOPY  06/16/2011   Procedure: ESOPHAGOGASTRODUODENOSCOPY (EGD);  Surgeon: Jeryl Columbia, MD;  Location[facility] ENDOSCOPY;  Service: Endoscopy;  Laterality: N/A;  . ESOPHAGOGASTRODUODENOSCOPY N/A 10/03/2013   Procedure: ESOPHAGOGASTRODUODENOSCOPY (EGD);  Surgeon: Jeryl Columbia, MD;  Location: [facility] ENDOSCOPY;  Service: Endoscopy;  Laterality: N/A;  . ESOPHAGOGASTRODUODENOSCOPY N/A 08/10/2014   Procedure: ESOPHAGOGASTRODUODENOSCOPY (EGD);  Surgeon: Jeryl Columbia, MD;  Location: [facility] ENDOSCOPY;  Service: Endoscopy;  Laterality: N/A;  . ESOPHAGOGASTRODUODENOSCOPY N/A 08/10/2016   Procedure: ESOPHAGOGASTRODUODENOSCOPY (EGD);  Surgeon: Clarene Essex, MD;  Location: [facility] ENDOSCOPY;  Service: Endoscopy;  Laterality: N/A;  . ESOPHAGOGASTRODUODENOSCOPY (EGD) WITH PROPOFOL N/A 02/15/2015   Procedure: ESOPHAGOGASTRODUODENOSCOPY (EGD) WITH PROPOFOL;  Surgeon: Clarene Essex, MD;  Location: [facility] ENDOSCOPY;  Service: Endoscopy;  Laterality: N/A;  . ESOPHAGOGASTRODUODENOSCOPY (EGD) WITH PROPOFOL N/A 11/12/2015   Procedure: ESOPHAGOGASTRODUODENOSCOPY (EGD) WITH PROPOFOL;  Surgeon: Clarene Essex, MD;  Location: [facility] ENDOSCOPY;  Service: Endoscopy;  Laterality: N/A;  . ESOPHAGOGASTRODUODENOSCOPY (EGD) WITH PROPOFOL N/A 08/07/2016   Procedure: ESOPHAGOGASTRODUODENOSCOPY (EGD) WITH PROPOFOL;  Surgeon: Clarene Essex, MD;  Location[facility] ENDOSCOPY;  Service: Endoscopy;  Laterality: N/A;  . ESOPHAGOGASTRODUODENOSCOPY (EGD) WITH PROPOFOL N/A 05/20/2017   Procedure: ESOPHAGOGASTRODUODENOSCOPY (EGD) WITH PROPOFOL;  Surgeon: Clarene Essex, MD;  Location: [facility] ENDOSCOPY;  Service: Endoscopy;  Laterality: N/A;  . ESOPHAGOGASTRODUODENOSCOPY (EGD) WITH PROPOFOL N/A 12/02/2017   Procedure: ESOPHAGOGASTRODUODENOSCOPY (EGD) WITH PROPOFOL;  Surgeon: Clarene Essex, MD;  Location: [facility];  Service: Endoscopy;  Laterality:  N/A;  have c arm available  . FRACTURE SURGERY     Hx: of left wrist  . GASTRECTOMY  [phone]  . GASTRECTOMY N/A    X3  . LITHOTRIPSY     Hx; of for kidney stones  . SAVORY DILATION N/A 08/10/2014   Procedure: SAVORY DILATION;  Surgeon: Jeryl Columbia, MD;  Location: [facility] ENDOSCOPY;  Service: Endoscopy;  Laterality: N/A;  . TOTAL HIP ARTHROPLASTY Right 12/30/2017   Procedure: RIGHT TOTAL HIP ARTHROPLASTY;  Surgeon: Carole Civil, MD;  Location: [facility] ORS;  Service: Orthopedics;  Laterality: Right;  . TOTAL HIP ARTHROPLASTY Left 02/17/2018   Procedure: TOTAL HIP ARTHROPLASTY;  Surgeon: Carole Civil, MD;  Location: [facility] ORS;  Service: Orthopedics;  Laterality: Left;  . TUBAL LIGATION     1975     OB History   None      Home Medications    Prior to Admission medications   Medication Sig Start Date End Date Taking? Authorizing Provider  acetaminophen (TYLENOL) 500 MG tablet Take 500 mg by mouth 2 (two) times daily.    [provider]  ALPRAZolam Duanne Moron) 0.5 MG tablet Take 1 tablet (0.5 mg total) by mouth 4 (four) times daily as needed for anxiety. 05/06/18   Hassell Done, Mary-Margaret, FNP  Ascorbic Acid (VITAMIN C) 500 MG  CHEW Chew 500 mg by mouth 2 (two) times daily.    [provider]  aspirin EC 325 MG EC tablet Take 1 tablet (325 mg total) by mouth 2 (two) times daily. Patient taking differently: Take 325 mg by mouth daily.  02/21/18   Carole Civil, MD  Calcium Carbonate (CALCIUM 600 PO) Take 600 mg by mouth 2 (two) times daily.    [provider]  Cholecalciferol (VITAMIN D) 2000 units tablet Take 2,000 Units by mouth 2 (two) times daily.    [provider]  docusate sodium (COLACE) 100 MG capsule Take 1 capsule (100 mg total) by mouth 2 (two) times daily. Patient taking differently: Take 100 mg by mouth daily.  01/03/18   Carole Civil, MD  Flaxseed, Linseed, (FLAXSEED OIL) 1200 MG CAPS Take 1,200 mg by mouth daily with lunch.     [provider]  levothyroxine (SYNTHROID, LEVOTHROID) 100 MCG tablet Take 1 tablet (100 mcg total) by mouth every morning. 05/06/18   Hassell Done, Mary-Margaret, FNP  loratadine (CLARITIN) 10 MG tablet Take 10 mg by mouth 2 (two) times daily.     [provider]  Multiple Vitamins-Minerals (CENTRUM SILVER) CHEW Chew 1 tablet by mouth 2 (two) times daily.    [provider]  omeprazole (PRILOSEC) 40 MG capsule Take 1 capsule (40 mg total) by mouth daily. 05/06/18   Hassell Done Mary-Margaret, FNP  Probiotic Product (PROBIOTIC PO) Take 1 capsule by mouth daily with lunch.     [provider]  sertraline (ZOLOFT) 100 MG tablet Take 1 tablet (100 mg total) by mouth at bedtime. 05/06/18   Hassell Done, Mary-Margaret, FNP  sucralfate (CARAFATE) 1 g tablet TAKE ONE (1) TABLET THREE (3) TIMES EACH DAY Patient taking differently: Take 1 g by mouth 3 (three) times daily with meals.  04/28/18   Chevis Pretty, FNP    Family History Family History  Problem Relation Age of Onset  . Diabetes Mother   . Cancer - Prostate Brother     Social History Social History   Tobacco Use  . Smoking status: Never Smoker  . Smokeless tobacco: Never Used  Substance Use Topics  . Alcohol use: No  . Drug use: No     Allergies   Augmentin [amoxicillin-pot clavulanate]; Famotidine; Klonopin [clonazepam]; Reglan [metoclopramide]; Nitrofurantoin; and Sulfamethoxazole-trimethoprim   Review of Systems Review of Systems  Unable to perform ROS: Acuity of condition  Respiratory: Positive for cough.   Cardiovascular: Positive for near-syncope.     Physical Exam Updated Vital Signs BP 135/73 (BP Location: Left Arm)   Pulse (!) 113   Temp (!) 97.5 F (36.4 C) (Rectal)   Resp (!) 27   Ht 5\' 5"  (1.651 m)   Wt 40.8 kg   SpO2 90%   BMI 14.98 kg/m    Patient Vitals for the past 24 hrs:  BP Temp Temp src Pulse Resp SpO2 Height Weight  06/12/18 0917 - (!) 97.5 F (36.4 C) Rectal - - -  - -  06/12/18 0859 135/73 (!) 97.5 F (36.4 C) Axillary (!) 113 (!) 27 90 % - -  06/12/18 0854 - - - - - - 5\' 5"  (1.651 m) 40.8 kg     Physical Exam 0905: Physical examination:  Nursing notes reviewed; Vital signs and O2 SAT reviewed;  Constitutional: Thin, Uncomfortable appearing.;; Head:  Normocephalic, atraumatic; Eyes: EOMI, PERRL, No scleral icterus; ENMT: Mouth and pharynx normal, Mucous membranes dry; Neck: Supple, Full range of motion, No lymphadenopathy;  Cardiovascular: Tachycardic rate and rhythm, No gallop; Respiratory: Breath sounds coarse & equal bilaterally, no wheezes. Speaking short sentences, sitting upright, tachypneic, +access mm use..; Chest: Nontender, Movement normal; Abdomen: Soft, Nontender, Nondistended, Normal bowel sounds; Genitourinary: No CVA tenderness; Extremities: Pulses normal, No tenderness, No edema, No calf edema or asymmetry.; Neuro: AA&Ox3, Major CN grossly intact. No facial droop. Speech clear. No gross focal motor deficits in extremities.; Skin: Color normal, Warm, Dry.    ED Treatments / Results  Labs (all labs ordered are listed, but only abnormal results are displayed)   EKG EKG Interpretation  Date/Time:  Sunday June 12 2018 08:57:30 EST Ventricular Rate:  115 PR Interval:    QRS Duration: 78 QT Interval:  317 QTC Calculation: 439 R Axis:   38 Text Interpretation:  Sinus tachycardia Multiform ventricular premature complexes Biatrial enlargement Probable left ventricular hypertrophy Anterior Q waves, possibly due to LVH Artifact When compared with ECG of 02/16/2018 Rate faster Otherwise no significant change Confirmed by Francine Graven [phone]) on 06/12/2018 9:16:20 AM   Radiology   Procedures Procedures (including critical care time)  Medications Ordered in ED Medications  albuterol (PROVENTIL,VENTOLIN) solution continuous neb (has no administration in time range)  ipratropium (ATROVENT) nebulizer solution 1 mg (has no  administration in time range)  0.9 %  sodium chloride infusion (has no administration in time range)     Initial Impression / Assessment and Plan / ED Course  I have reviewed the triage vital signs and the nursing notes.  Pertinent labs & imaging results that were available during my care of the patient were reviewed by me and considered in my medical decision making (see chart for details).  MDM Reviewed: previous chart, nursing note and vitals Reviewed previous: labs and ECG Interpretation: labs, ECG and x-ray Total time providing critical care: 30-74 minutes. This excludes time spent performing separately reportable procedures and services. Consults: admitting MD   CRITICAL CARE Performed by: Francine Graven Total critical care time: 45 minutes Critical care time was exclusive of separately billable procedures and treating other patients. Critical care was necessary to treat or prevent imminent or life-threatening deterioration. Critical care was time spent personally by me on the following activities: development of treatment plan with patient and/or surrogate as well as nursing, discussions with consultants, evaluation of patient's response to treatment, examination of patient, obtaining history from patient or surrogate, ordering and performing treatments and interventions, ordering and review of laboratory studies, ordering and review of radiographic studies, pulse oximetry and re-evaluation of patient's condition.   Results for orders placed or performed during the hospital encounter of 06/12/18  Culture, blood (Routine x 2)  Result Value Ref Range   Specimen Description LEFT ANTECUBITAL    Special Requests      BOTTLES DRAWN AEROBIC AND ANAEROBIC Blood Culture adequate volume Performed at [facility], [address] [place], [place] [address]    Culture PENDING    Report Status PENDING   Culture, blood (Routine x 2)  Result Value Ref Range   Specimen Description BLOOD  RIGHT WRIST    Special Requests      BOTTLES DRAWN AEROBIC ONLY Blood Culture adequate volume Performed at [facility], [address], [place], [place] [address]    Culture PENDING    Report Status PENDING   Urinalysis, Routine w reflex microscopic  Result Value Ref Range   Color, Urine YELLOW YELLOW   APPearance HAZY (A) CLEAR   Specific Gravity, Urine 1.015 1.005 - 1.030  pH 6.0 5.0 - 8.0   Glucose, UA NEGATIVE NEGATIVE mg/dL   Hgb urine dipstick LARGE (A) NEGATIVE   Bilirubin Urine NEGATIVE NEGATIVE   Ketones, ur NEGATIVE NEGATIVE mg/dL   Protein, ur 100 (A) NEGATIVE mg/dL   Nitrite NEGATIVE NEGATIVE   Leukocytes, UA NEGATIVE NEGATIVE   RBC / HPF >50 (H) 0 - 5 RBC/hpf   WBC, UA 21-50 0 - 5 WBC/hpf   Bacteria, UA NONE SEEN NONE SEEN   Squamous Epithelial / LPF 0-5 0 - 5  Protime-INR  Result Value Ref Range   Prothrombin Time 14.8 11.4 - 15.2 seconds   INR 1.17   Influenza panel by PCR (type A & B)  Result Value Ref Range   Influenza A By PCR NEGATIVE NEGATIVE   Influenza B By PCR NEGATIVE NEGATIVE  Comprehensive metabolic panel  Result Value Ref Range   Sodium 143 135 - 145 mmol/L   Potassium 3.9 3.5 - 5.1 mmol/L   Chloride 115 (H) 98 - 111 mmol/L   CO2 20 (L) 22 - 32 mmol/L   Glucose, Bld 121 (H) 70 - 99 mg/dL   BUN 22 8 - 23 mg/dL   Creatinine, Ser 0.81 0.44 - 1.00 mg/dL   Calcium 8.7 (L) 8.9 - 10.3 mg/dL   Total Protein 6.6 6.5 - 8.1 g/dL   Albumin 3.6 3.5 - 5.0 g/dL   AST 50 (H) 15 - 41 U/L   ALT 42 0 - 44 U/L   Alkaline Phosphatase 108 38 - 126 U/L   Total Bilirubin 1.0 0.3 - 1.2 mg/dL   GFR calc non Af Amer >60 >60 mL/min   GFR calc Af Amer >60 >60 mL/min   Anion gap 8 5 - 15  Brain natriuretic peptide  Result Value Ref Range   B Natriuretic Peptide 579.0 (H) 0.0 - 100.0 pg/mL  Troponin I Once  Result Value Ref Range   Troponin I 0.03 (HH) <0.03 ng/mL  CBC with Differential  Result Value Ref Range   WBC 9.4 4.0 - 10.5 K/uL   RBC 4.31 3.87 - 5.11  MIL/uL   Hemoglobin 13.2 12.0 - 15.0 g/dL   HCT 44.8 36.0 - 46.0 %   MCV 103.9 (H) 80.0 - 100.0 fL   MCH 30.6 26.0 - 34.0 pg   MCHC 29.5 (L) 30.0 - 36.0 g/dL   RDW 15.1 11.5 - 15.5 %   Platelets 273 150 - 400 K/uL   nRBC 0.0 0.0 - 0.2 %   Neutrophils Relative % 83 %   Neutro Abs 7.7 1.7 - 7.7 K/uL   Lymphocytes Relative 3 %   Lymphs Abs 0.3 (L) 0.7 - 4.0 K/uL   Monocytes Relative 11 %   Monocytes Absolute 1.1 (H) 0.1 - 1.0 K/uL   Eosinophils Relative 1 %   Eosinophils Absolute 0.1 0.0 - 0.5 K/uL   Basophils Relative 1 %   Basophils Absolute 0.1 0.0 - 0.1 K/uL   WBC Morphology      MODERATE LEFT SHIFT (>5% METAS AND MYELOS,OCC PRO NOTED)   Immature Granulocytes 1 %   Abs Immature Granulocytes 0.07 0.00 - 0.07 K/uL  CBG monitoring, ED  Result Value Ref Range   Glucose-Capillary 119 (H) 70 - 99 mg/dL  I-Stat CG4 Lactic Acid, ED  Result Value Ref Range   Lactic Acid, Venous 1.88 0.5 - 1.9 mmol/L    Dg Chest Port 1 View Result Date: 06/12/2018 CLINICAL DATA:  67 year old female with anemia, congestion and sepsis  EXAM: PORTABLE CHEST 1 VIEW COMPARISON:  Prior chest x-ray 12/29/2017 FINDINGS: Stable cardiac and mediastinal contours. Increased pulmonary vascular congestion with bilateral perihilar interstitial and airspace opacities. Findings suggest mild CHF. No pleural effusion or pneumothorax. Stable hyperinflation. No acute osseous abnormality. IMPRESSION: 1. Increased pulmonary vascular congestion now with bilateral perihilar, lingular and right middle lobe interstitial and airspace opacities. Overall, the findings are most suggestive of a mild CHF. However, in the appropriate clinical setting multifocal pneumonia involving the lingula and right middle lobe could be considered. 2. Pulmonary hyperinflation and chronic bronchitic changes. Electronically Signed   By: Jacqulynn Cadet M.D.   On: 06/12/2018 10:09    1115:  Pt SOB and hypoxic on arrival: Bipap and hour long neb started.  IV solumedrol and as well as IV abx and IV lasix given for CXR and BNP findings above. Pt remains afebrile, appears more comfortable at rest while on bipap. Sats improved 96-98% while on bipap. Denies CP. Dx and testing d/w pt and family.  Questions answered.  Verb understanding, agreeable to admit.  T/C returned from [facility] Dr. Carles Collet, case discussed, including:  HPI, pertinent PM/SHx, VS/PE, dx testing, ED course and treatment:  Agreeable to come to ED for evaluation for admission.        Final Clinical Impressions(s) / ED Diagnoses   Final diagnoses:  None    ED Discharge Orders    None       Francine Graven, DO 06/15/18 1548

## 2018-06-12 NOTE — ED Notes (Signed)
AC notified for nebulizer solution.

## 2018-06-12 NOTE — ED Notes (Signed)
Delay in collecting Lactic acid due to hard stick.

## 2018-06-12 NOTE — ED Notes (Signed)
Pt was informed that we need a urine sample. Pt states that she can not urinate at this time. 

## 2018-06-12 NOTE — ED Notes (Signed)
Patient c/o generalized weakness with near syncope, unsteady gait, vomiting, and congested cough. Patient on liquid diet x3 days for surgery tomorrow morning-patient supposed to have esophagus stretched with balloon per family. Patient vomiting since Friday after she had iron injection-which is her normal reaction. Patient started having chest congestion with productive cough per family x2 days ago. Patient took delsym last night and has had large amount of thick green-yellow sputum. Denies any known fevers or pain. Denies any diarrhea. Patient has labored breathing and does report some shortness of breath. O2 sat 72% on room air. Patient placed on 4 liters of oxygen and EDP notified immediately.

## 2018-06-12 NOTE — ED Notes (Signed)
Date and time results received: 06/12/18 1353  Test: Lactic Acid Critical Value: 3.65  Name of Provider Notified: Dr. Carles Collet  Orders Received? Or Actions Taken? See orders

## 2018-06-12 NOTE — ED Notes (Signed)
CRITICAL VALUE ALERT  Critical Value:  Troponin 0.03  Date & Time Notied:  06/12/2018, 1042  Provider Notified: Dr.McManus  Orders Received/Actions taken: see chart

## 2018-06-13 ENCOUNTER — Inpatient Hospital Stay (HOSPITAL_COMMUNITY): Payer: PPO

## 2018-06-13 ENCOUNTER — Encounter (HOSPITAL_COMMUNITY)
Admission: EM | Disposition: A | Discharge status: Home / Self Care | Payer: Self-pay | Source: Home / Self Care | Attending: Internal Medicine

## 2018-06-13 ENCOUNTER — Encounter (HOSPITAL_COMMUNITY): Payer: Self-pay | Admitting: Anesthesiology

## 2018-06-13 ENCOUNTER — Ambulatory Visit (HOSPITAL_COMMUNITY): Admission: RE | Admit: 2018-06-13 | Payer: PPO | Source: Ambulatory Visit | Admitting: Gastroenterology

## 2018-06-13 DIAGNOSIS — I5030 Unspecified diastolic (congestive) heart failure: Secondary | ICD-10-CM

## 2018-06-13 LAB — CBC
HCT: 35.9 % — ABNORMAL LOW (ref 36.0–46.0)
HEMOGLOBIN: 11 g/dL — AB (ref 12.0–15.0)
MCH: 30.6 pg (ref 26.0–34.0)
MCHC: 30.6 g/dL (ref 30.0–36.0)
MCV: 100 fL (ref 80.0–100.0)
PLATELETS: 226 10*3/uL (ref 150–400)
RBC: 3.59 MIL/uL — AB (ref 3.87–5.11)
RDW: 15.1 % (ref 11.5–15.5)
WBC: 8.8 10*3/uL (ref 4.0–10.5)
nRBC: 0 % (ref 0.0–0.2)

## 2018-06-13 LAB — BASIC METABOLIC PANEL
Anion gap: 9 (ref 5–15)
BUN: 31 mg/dL — AB (ref 8–23)
CALCIUM: 8.5 mg/dL — AB (ref 8.9–10.3)
CHLORIDE: 116 mmol/L — AB (ref 98–111)
CO2: 21 mmol/L — AB (ref 22–32)
CREATININE: 1.01 mg/dL — AB (ref 0.44–1.00)
GFR calc Af Amer: >60 mL/min (ref >60)
GFR calc non Af Amer: 56 mL/min — ABNORMAL LOW (ref >60)
GLUCOSE: 114 mg/dL — AB (ref 70–99)
Potassium: 4 mmol/L (ref 3.5–5.1)
Sodium: 146 mmol/L — ABNORMAL HIGH (ref 135–145)

## 2018-06-13 LAB — RESPIRATORY PANEL BY PCR
Adenovirus: NOT DETECTED
BORDETELLA PERTUSSIS-RVPCR: NOT DETECTED
CORONAVIRUS 229E-RVPPCR: NOT DETECTED
CORONAVIRUS OC43-RVPPCR: NOT DETECTED
Chlamydophila pneumoniae: NOT DETECTED
Coronavirus HKU1: NOT DETECTED
Coronavirus NL63: NOT DETECTED
INFLUENZA B-RVPPCR: NOT DETECTED
Influenza A: NOT DETECTED
MYCOPLASMA PNEUMONIAE-RVPPCR: NOT DETECTED
Metapneumovirus: NOT DETECTED
PARAINFLUENZA VIRUS 1-RVPPCR: NOT DETECTED
Parainfluenza Virus 2: NOT DETECTED
Parainfluenza Virus 3: NOT DETECTED
Parainfluenza Virus 4: NOT DETECTED
RESPIRATORY SYNCYTIAL VIRUS-RVPPCR: NOT DETECTED
Rhinovirus / Enterovirus: NOT DETECTED

## 2018-06-13 LAB — URINE CULTURE: CULTURE: NO GROWTH

## 2018-06-13 LAB — ECHOCARDIOGRAM COMPLETE
HEIGHTINCHES: 65 in
Weight: 1400.36 oz

## 2018-06-13 LAB — TROPONIN I: Troponin I: 0.03 ng/mL (ref <0.03)

## 2018-06-13 LAB — MRSA PCR SCREENING: MRSA by PCR: POSITIVE — AB

## 2018-06-13 SURGERY — CANCELLED PROCEDURE

## 2018-06-13 MED ORDER — ENOXAPARIN SODIUM 30 MG/0.3ML ~~LOC~~ SOLN
30.0000 mg | SUBCUTANEOUS | Status: DC
  Administered 2018-06-13 – 2018-06-14 (×2): 30 mg via SUBCUTANEOUS
  Filled 2018-06-13 (×2): qty 0.3

## 2018-06-13 MED ORDER — MUPIROCIN 2 % EX OINT
TOPICAL_OINTMENT | Freq: Two times a day (BID) | CUTANEOUS | Status: DC
  Administered 2018-06-13 – 2018-06-15 (×4): via NASAL
  Filled 2018-06-13: qty 22

## 2018-06-13 MED ORDER — ALPRAZOLAM 0.5 MG PO TABS
0.5000 mg | ORAL_TABLET | Freq: Four times a day (QID) | ORAL | Status: DC | PRN
  Administered 2018-06-13 – 2018-06-14 (×4): 0.5 mg via ORAL
  Filled 2018-06-13 (×4): qty 1

## 2018-06-13 MED ORDER — IPRATROPIUM-ALBUTEROL 0.5-2.5 (3) MG/3ML IN SOLN
3.0000 mL | Freq: Four times a day (QID) | RESPIRATORY_TRACT | Status: DC
  Administered 2018-06-13 (×3): 3 mL via RESPIRATORY_TRACT
  Filled 2018-06-13 (×3): qty 3

## 2018-06-13 MED ORDER — METHYLPREDNISOLONE SODIUM SUCC 125 MG IJ SOLR
60.0000 mg | Freq: Four times a day (QID) | INTRAMUSCULAR | Status: AC
  Administered 2018-06-13: 60 mg via INTRAVENOUS

## 2018-06-13 MED ORDER — FUROSEMIDE 10 MG/ML IJ SOLN
20.0000 mg | Freq: Once | INTRAMUSCULAR | Status: AC
  Administered 2018-06-13: 20 mg via INTRAVENOUS
  Filled 2018-06-13: qty 2

## 2018-06-13 MED ORDER — ORAL CARE MOUTH RINSE
15.0000 mL | Freq: Two times a day (BID) | OROMUCOSAL | Status: DC
  Administered 2018-06-13 – 2018-06-14 (×3): 15 mL via OROMUCOSAL

## 2018-06-13 MED ORDER — SUCRALFATE 1 G PO TABS
1.0000 g | ORAL_TABLET | Freq: Three times a day (TID) | ORAL | Status: DC
  Administered 2018-06-13 – 2018-06-15 (×9): 1 g via ORAL
  Filled 2018-06-13 (×9): qty 1

## 2018-06-13 MED ORDER — CHLORHEXIDINE GLUCONATE 0.12 % MT SOLN
15.0000 mL | Freq: Two times a day (BID) | OROMUCOSAL | Status: DC
  Administered 2018-06-13 – 2018-06-14 (×3): 15 mL via OROMUCOSAL
  Filled 2018-06-13 (×3): qty 15

## 2018-06-13 MED ORDER — PREDNISONE 20 MG PO TABS
50.0000 mg | ORAL_TABLET | Freq: Every day | ORAL | Status: DC
  Administered 2018-06-14: 50 mg via ORAL
  Filled 2018-06-13: qty 1

## 2018-06-13 MED ORDER — LEVOTHYROXINE SODIUM 100 MCG PO TABS
100.0000 ug | ORAL_TABLET | Freq: Every day | ORAL | Status: DC
  Administered 2018-06-14 – 2018-06-15 (×2): 100 ug via ORAL
  Filled 2018-06-13 (×2): qty 1

## 2018-06-13 SURGICAL SUPPLY — 15 items

## 2018-06-13 NOTE — Anesthesia Preprocedure Evaluation (Deleted)
Anesthesia Evaluation    Reviewed: Allergy & Precautions, Patient's Chart, lab work & pertinent test results  History of Anesthesia Complications Negative for: history of anesthetic complications  Airway        Dental   Pulmonary neg pulmonary ROS,           Cardiovascular (-) anginanegative cardio ROS       Neuro/Psych PSYCHIATRIC DISORDERS Anxiety negative neurological ROS     GI/Hepatic Neg liver ROS, GERD  Medicated and Controlled,Recurrent GI bleed   Endo/Other  Hypothyroidism   Renal/GU negative Renal ROS     Musculoskeletal   Abdominal   Peds  Hematology   Anesthesia Other Findings   Reproductive/Obstetrics                           Anesthesia Physical  Anesthesia Plan  ASA: III  Anesthesia Plan: MAC   Post-op Pain Management:    Induction:   PONV Risk Score and Plan: 2 and Treatment may vary due to age or medical condition and Ondansetron  Airway Management Planned: Natural Airway and Nasal Cannula  Additional Equipment:   Intra-op Plan:   Post-operative Plan:   Informed Consent:   Plan Discussed with:   Anesthesia Plan Comments:       Anesthesia Quick Evaluation

## 2018-06-13 NOTE — Progress Notes (Signed)
PROGRESS NOTE  Brittany Hensley LNL:[id number] DOB: 1951/06/22 DOA: 06/12/2018 PCP: Chevis Pretty, FNP  Brief History:   67 y.o. female with medical history of anxiety, Roux-en-Y gastrojejunostomy with anastomotic stricture, hypothyroidism, and iron deficiency anemia presenting with 2 to 3-day history of increasing shortness of breath, coughing, chest congestion that began on 06/10/2018 after she had an iron infusion.  The patient denied any fevers, chills, chest pain, abdominal pain.  She did have an episode of nausea and vomiting which is common for her after her iron infusions.  She denies any diarrhea, hematochezia, melena.  She has not had any further emesis.  On the morning of 06/12/2018, the patient continued to have worsening shortness of breath and generalized weakness.  As result, the patient was brought to emergency department for further evaluation.  She was noted to have respiratory distress with oxygen saturation of 70 per 2% on room air.  She was placed on BiPAP.  Patient has never smoked in her lifetime, but was exposed to a significant amount of secondhand smoke from her husband. In the emergency department, the patient was afebrile and hemodynamically stable with oxygen saturation of 100% on BiPAP 50%.  She was tachycardic into the 120s.  Chest x-ray showed vascular congestion with possible airspace opacity in the left lower lobe and right middle lobe.  The patient was given furosemide 20 mg IV.  She was started on levofloxacin and vancomycin.  Solu-Medrol IV was also given.  Assessment/Plan: Acute respiratory failure with hypoxia -Multifactorial including pulmonary edema, pneumonia, and possible COPD -Remain on BiPAP>>>5L [place] -Wean oxygen as tolerated -Pulmonary hygiene -Check influenza--neg  Acute CHF, type unspecified -The patient has clinical signs of fluid overload -Give furosemide 20 mg IV x2 with clinical improvement -NEG 1.1 kg -redose IV lasix  today -Repeat chest x-ray--personally reviewed vascular congestion/interstitial markings improving vs 11/10 -Echo -daily weights  Lobar pneumonia -Start cefepime and azithromycin -Check procalcitonin 6.47 -Follow blood cultures -Lactic acid 1.88 -Started Solu-Medrol>>prednisone -Start duo nebs -continue vancomycin pending cultures  Hypothyroidism  -convert to po levothyroxine  Pyuria -Continue cefepime pending culture data  Anxiety -Start IV lorazepam until the patient is able to tolerate p.o -now convert to po home alprazolam.     Disposition Plan:   Home in 1-2 days  Family Communication: No  Family at bedside  Consultants: none   Code Status:  FULL   DVT Prophylaxis:  [place] Lovenox   Procedures: As Listed in Progress Note Above  Antibiotics: levoflox 11/10 vanco 11/10>>> Cefepime 11/10>>> azithro 11/10>>>      Subjective: Pt breathing better but has dyspnea with mild exertion.  Denies cp, f/c, n/v/d, abd pain.  Has nonproductive cough  Objective: Vitals:   06/13/18 0237 06/13/18 0300 06/13/18 0439 06/13/18 0838  BP:      Pulse: (!) 112 (!) 114    Resp: (!) 26 (!) 23    Temp:   98.7 F (37.1 C)   TempSrc:      SpO2: 96% 96%  96%  Weight:      Height:        Intake/Output Summary (Last 24 hours) at 06/13/2018 1054 Last data filed at 06/13/2018 0100 Gross per 24 hour  Intake 1543.15 ml  Output -  Net 1543.15 ml   Weight change:  Exam:   General:  Pt is alert, follows commands appropriately, not in acute distress  HEENT: No icterus, No thrush, No neck mass, [place]/AT  Cardiovascular: RRR, S1/S2, no rubs, no gallops+JVD  Respiratory: bibasilar crackles, no wheeze  Abdomen: Soft/+BS, non tender, non distended, no guarding  Extremities: No edema, No lymphangitis, No petechiae, No rashes, no synovitis   Data Reviewed: I have personally reviewed following labs and imaging studies Basic Metabolic Panel: Recent Labs  Lab  06/12/18 0944 06/13/18 0359  NA 143 146*  K 3.9 4.0  CL 115* 116*  CO2 20* 21*  GLUCOSE 121* 114*  BUN 22 31*  CREATININE 0.81 1.01*  CALCIUM 8.7* 8.5*   Liver Function Tests: Recent Labs  Lab 06/12/18 0944  AST 50*  ALT 42  ALKPHOS 108  BILITOT 1.0  PROT 6.6  ALBUMIN 3.6   No results for input(s): LIPASE, AMYLASE in the last 168 hours. No results for input(s): AMMONIA in the last 168 hours. Coagulation Profile: Recent Labs  Lab 06/12/18 0944  INR 1.17   CBC: Recent Labs  Lab 06/12/18 0944 06/13/18 0359  WBC 9.4 8.8  NEUTROABS 7.7  --   HGB 13.2 11.0*  HCT 44.8 35.9*  MCV 103.9* 100.0  PLT 273 226   Cardiac Enzymes: Recent Labs  Lab 06/12/18 0944 06/12/18 1915 06/13/18 0045  TROPONINI 0.03* 0.03* 0.03*   BNP: Invalid input(s): POCBNP CBG: Recent Labs  Lab 06/12/18 0905  GLUCAP 119*   HbA1C: No results for input(s): HGBA1C in the last 72 hours. Urine analysis:    Component Value Date/Time   COLORURINE YELLOW 06/12/2018 0859   APPEARANCEUR HAZY (A) 06/12/2018 0859   LABSPEC 1.015 06/12/2018 0859   PHURINE 6.0 06/12/2018 0859   GLUCOSEU NEGATIVE 06/12/2018 0859   HGBUR LARGE (A) 06/12/2018 0859   BILIRUBINUR NEGATIVE 06/12/2018 0859   BILIRUBINUR neg 03/27/2014 [place] 06/12/2018 0859   PROTEINUR 100 (A) 06/12/2018 0859   UROBILINOGEN negative 03/27/2014 1241   UROBILINOGEN 0.2 02/16/2007 1911   NITRITE NEGATIVE 06/12/2018 0859   LEUKOCYTESUR NEGATIVE 06/12/2018 0859   Sepsis Labs: @LABRCNTIP (procalcitonin:4,lacticidven:4) ) Recent Results (from the past 240 hour(s))  Culture, blood (Routine x 2)     Status: None (Preliminary result)   Collection Time: 06/12/18  9:45 AM  Result Value Ref Range Status   Specimen Description LEFT ANTECUBITAL  Final   Special Requests   Final    BOTTLES DRAWN AEROBIC AND ANAEROBIC Blood Culture adequate volume   Culture   Final    NO GROWTH < 24 HOURS Performed at [facility], [address], [place], [place] [address]    Report Status PENDING  Incomplete  Culture, blood (Routine x 2)     Status: None (Preliminary result)   Collection Time: 06/12/18  9:45 AM  Result Value Ref Range Status   Specimen Description BLOOD RIGHT WRIST  Final   Special Requests   Final    BOTTLES DRAWN AEROBIC ONLY Blood Culture adequate volume   Culture   Final    NO GROWTH < 24 HOURS Performed at [facility], [address] [place], [place] [address]    Report Status PENDING  Incomplete  MRSA PCR Screening     Status: Abnormal   Collection Time: 06/13/18 12:34 AM  Result Value Ref Range Status   MRSA by PCR POSITIVE (A) NEGATIVE Final    Comment:        The GeneXpert MRSA Assay (FDA approved for NASAL specimens only), is one component of a comprehensive MRSA colonization surveillance program. It is not intended to diagnose MRSA infection nor to guide or monitor  treatment for MRSA infections. RESULT CALLED TO, READ BACK BY AND VERIFIED WITH: HEARN,J@0637  BY MATTHEWS,B 11.11.19 Performed at [facility], [address] [place], [place] [address]      Scheduled Meds: . budesonide (PULMICORT) nebulizer solution  0.5 mg Nebulization BID  . chlorhexidine  15 mL Mouth Rinse BID  . enoxaparin (LOVENOX) injection  40 mg Subcutaneous Q24H  . ipratropium-albuterol  3 mL Nebulization Q6H WA  . levothyroxine  50 mcg Intravenous Daily  . mouth rinse  15 mL Mouth Rinse q12n4p  . methylPREDNISolone (SOLU-MEDROL) injection  60 mg Intravenous Q6H  . sertraline  100 mg Oral QHS   Continuous Infusions: . azithromycin Stopped (06/12/18 2015)  . ceFEPime (MAXIPIME) IV Stopped (06/12/18 2233)  . vancomycin Stopped (06/12/18 1254)    Procedures/Studies: Dg Chest Port 1 View  Result Date: 06/13/2018 CLINICAL DATA:  Acute onset of respiratory failure. Chest congestion and productive cough. Generalized weakness. EXAM: PORTABLE CHEST 1 VIEW COMPARISON:  Chest radiograph performed  06/12/2018 FINDINGS: The lungs are well-aerated. Vascular congestion is noted. Increased interstitial markings raise concern for mild pulmonary edema, though pneumonia could have a similar appearance. There is no evidence of pleural effusion or pneumothorax. The cardiomediastinal silhouette is within normal limits. No acute osseous abnormalities are seen. IMPRESSION: Vascular congestion noted. Increased interstitial markings raise concern for mild pulmonary edema, though pneumonia could have a similar appearance. Electronically Signed   By: Garald Balding M.D.   On: 06/13/2018 05:43   Dg Chest Port 1 View  Result Date: 06/12/2018 CLINICAL DATA:  67 year old female with anemia, congestion and sepsis EXAM: PORTABLE CHEST 1 VIEW COMPARISON:  Prior chest x-ray 12/29/2017 FINDINGS: Stable cardiac and mediastinal contours. Increased pulmonary vascular congestion with bilateral perihilar interstitial and airspace opacities. Findings suggest mild CHF. No pleural effusion or pneumothorax. Stable hyperinflation. No acute osseous abnormality. IMPRESSION: 1. Increased pulmonary vascular congestion now with bilateral perihilar, lingular and right middle lobe interstitial and airspace opacities. Overall, the findings are most suggestive of a mild CHF. However, in the appropriate clinical setting multifocal pneumonia involving the lingula and right middle lobe could be considered. 2. Pulmonary hyperinflation and chronic bronchitic changes. Electronically Signed   By: Jacqulynn Cadet M.D.   On: 06/12/2018 10:09    Orson Eva, DO  [facility] Pager [phone]  If 7PM-7AM, please contact night-coverage [email or web address] Password [email or web address] 06/13/2018, 10:54 AM   LOS: 1 day

## 2018-06-13 NOTE — Progress Notes (Signed)
Patient appears greatly improved. Will try off BiPAP in morning. No wheezes noted, suspect were from chf. Will make nebs while awake after tonight. Patient x-rays have asthmatic appearance elongated expanded ? Patient never smoked only 2nd hand smoke which was husband.

## 2018-06-13 NOTE — Plan of Care (Signed)

## 2018-06-13 NOTE — Progress Notes (Signed)
*  PRELIMINARY RESULTS* Echocardiogram 2D Echocardiogram has been performed.  Leavy Cella 06/13/2018, 4:12 PM

## 2018-06-14 DIAGNOSIS — I5031 Acute diastolic (congestive) heart failure: Secondary | ICD-10-CM

## 2018-06-14 DIAGNOSIS — J690 Pneumonitis due to inhalation of food and vomit: Secondary | ICD-10-CM

## 2018-06-14 DIAGNOSIS — R55 Syncope and collapse: Secondary | ICD-10-CM

## 2018-06-14 LAB — BASIC METABOLIC PANEL
Anion gap: 7 (ref 5–15)
BUN: 30 mg/dL — AB (ref 8–23)
CHLORIDE: 115 mmol/L — AB (ref 98–111)
CO2: 22 mmol/L (ref 22–32)
CREATININE: 0.99 mg/dL (ref 0.44–1.00)
Calcium: 8.2 mg/dL — ABNORMAL LOW (ref 8.9–10.3)
GFR calc Af Amer: >60 mL/min (ref >60)
GFR calc non Af Amer: 57 mL/min — ABNORMAL LOW (ref >60)
Glucose, Bld: 107 mg/dL — ABNORMAL HIGH (ref 70–99)
Potassium: 3.4 mmol/L — ABNORMAL LOW (ref 3.5–5.1)
SODIUM: 144 mmol/L (ref 135–145)

## 2018-06-14 LAB — PROCALCITONIN: Procalcitonin: 3.31 ng/mL

## 2018-06-14 LAB — MAGNESIUM: Magnesium: 2 mg/dL (ref 1.7–2.4)

## 2018-06-14 LAB — BRAIN NATRIURETIC PEPTIDE: B Natriuretic Peptide: 125 pg/mL — ABNORMAL HIGH (ref 0.0–100.0)

## 2018-06-14 MED ORDER — FUROSEMIDE 20 MG PO TABS
20.0000 mg | ORAL_TABLET | Freq: Once | ORAL | Status: AC
  Administered 2018-06-14: 20 mg via ORAL
  Filled 2018-06-14: qty 1

## 2018-06-14 MED ORDER — IPRATROPIUM-ALBUTEROL 0.5-2.5 (3) MG/3ML IN SOLN
3.0000 mL | Freq: Three times a day (TID) | RESPIRATORY_TRACT | Status: DC
  Administered 2018-06-14 – 2018-06-15 (×5): 3 mL via RESPIRATORY_TRACT
  Filled 2018-06-14 (×5): qty 3

## 2018-06-14 MED ORDER — SODIUM CHLORIDE 0.9 % IV SOLN
INTRAVENOUS | Status: DC | PRN
  Administered 2018-06-14: 250 mL via INTRAVENOUS

## 2018-06-14 MED ORDER — ALBUTEROL SULFATE (2.5 MG/3ML) 0.083% IN NEBU: 2.5000 mg | INHALATION_SOLUTION | RESPIRATORY_TRACT | Status: DC | PRN

## 2018-06-14 MED ORDER — LEVOFLOXACIN 500 MG PO TABS
500.0000 mg | ORAL_TABLET | Freq: Every day | ORAL | Status: DC
  Administered 2018-06-15: 500 mg via ORAL
  Filled 2018-06-14: qty 1

## 2018-06-14 MED ORDER — POTASSIUM CHLORIDE CRYS ER 20 MEQ PO TBCR
20.0000 meq | EXTENDED_RELEASE_TABLET | Freq: Once | ORAL | Status: AC
  Administered 2018-06-14: 20 meq via ORAL
  Filled 2018-06-14: qty 1

## 2018-06-14 MED ORDER — ALPRAZOLAM 0.5 MG PO TABS
0.5000 mg | ORAL_TABLET | Freq: Four times a day (QID) | ORAL | Status: DC | PRN
  Administered 2018-06-14 – 2018-06-15 (×4): 0.5 mg via ORAL
  Filled 2018-06-14 (×4): qty 1

## 2018-06-14 NOTE — Plan of Care (Signed)

## 2018-06-14 NOTE — Progress Notes (Addendum)
PROGRESS NOTE  Brittany Hensley HQP:[id number] DOB: [birth year] DOA: 06/12/2018 PCP: Chevis Pretty, FNP  Brief History:  67 y.o.femalewith medical history ofanxiety, Roux-en-Y gastrojejunostomy with anastomotic stricture, hypothyroidism, and iron deficiency anemia presenting with 2 to 3-day history of increasing shortness of breath, coughing, chest congestion that began on 06/10/2018 after she had an iron infusion. The patient denied any fevers, chills, chest pain, abdominal pain. She did have an episode of nausea and vomiting which is common for her after her iron infusions. She denies any diarrhea, hematochezia, melena. She has not had any further emesis.On the morning of 06/12/2018, the patient continued to have worsening shortness of breath and generalized weakness. As result, the patient was brought to emergency department for further evaluation. She was noted to have respiratory distress with oxygen saturation of 70 per 2% on room air. She was placed on BiPAP. Patient has never smoked in her lifetime, but was exposed to a significant amount of secondhand smoke from her husband. In the emergency department, the patient was afebrile and hemodynamically stable with oxygen saturation of 100% on BiPAP 50%. She was tachycardic into the 120s. Chest x-ray showed vascular congestion with possible airspace opacity in the left lower lobe and right middle lobe. The patient was given furosemide 20 mg IV. She was started on levofloxacin and vancomycin. Solu-Medrol IV was also given.  Assessment/Plan: Acute respiratory failure with hypoxia -Multifactorial including pulmonary edema, pneumonia, and possible COPD -Remain on BiPAP>>>5L >>>4L -Wean oxygen as tolerated -Pulmonary hygiene -Check influenza--neg -Viral respiratory panel negative -Suspect the patient will need to go home on supplemental oxygen -Plan to check amatory pulse oximetry on room air prior to  discharge  Acute diastolic CHF -The patient has clinical signs of fluid overload -Give furosemide 20 mg IV x2 with clinical improvement -NEG 4.1 kg -had 2 days IV lasix -lasix po 20 mg x1, then likely prn after d/c -Repeat chest x-ray--personally reviewed vascular congestion/interstitial markings improving vs 11/10 -Echo--EF 60 to 65%, grade 1 DD, trivial MR  Lobar pneumonia/Aspiration pneumonitis -Started cefepime and azithromycin -11/11 CT chest interstitial and nodular patchy bilateral airspace disease with tree-in-bud appearance -Check procalcitonin 6.47>>>3.31 -Follow blood cultures--neg -Lactic acid 1.88 -Started Solu-Medrol>>prednisone--had 3 days of steroids -Continue duo nebs -continue vancomycin pending cultures-->d/c -transition to levofloxacin 11/13  Hypothyroidism -convert to po levothyroxine  Pyuria -initially on cefepime pending culture data -culture--neg  Anxiety -Started IV lorazepam until the patient is able to tolerate p.o -now convert to po home alprazolam.  Stage 2 sacral pressure injury -not infected -local wound care -present prior to admission  Hypokalemia -replete -check mag    Disposition Plan:   Home 11/13 if stable  Family Communication: No  Family at bedside  Consultants: none   Code Status:  FULL   DVT Prophylaxis:  [place] Lovenox   Procedures: As Listed in Progress Note Above  Antibiotics: levoflox 11/10 and then 11/13>>> vanco 11/10>>>11/12 Cefepime 11/10>>>11/12 azithro 11/10>>>11/12        Subjective: Patient is feeling better.  She still has some dyspnea with mild exertion.  She has a nonproductive cough.  She denies any fevers, chills, chest pain, nausea, vomiting, diarrhea, abdominal pain, dysuria, hematuria but there is no hematochezia, melena.  There is no hemoptysis.  Objective: Vitals:   06/14/18 0900 06/14/18 1000 06/14/18 1100 06/14/18 1200  BP:  100/63  102/71  Pulse: (!) 113 92 87 98   Resp:   18 19  Temp:  TempSrc:      SpO2: 90% 94% 98% 96%  Weight:      Height:        Intake/Output Summary (Last 24 hours) at 06/14/2018 1413 Last data filed at 06/14/2018 1200 Gross per 24 hour  Intake 691.03 ml  Output 950 ml  Net -258.97 ml   Weight change: -4.124 kg Exam:   General:  Pt is alert, follows commands appropriately, not in acute distress  HEENT: No icterus, No thrush, No neck mass, [place]/AT  Cardiovascular: RRR, S1/S2, no rubs, no gallops; no JVD  Respiratory: Bibasilar crackles but no wheezing.  Good air movement.  Abdomen: Soft/+BS, non tender, non distended, no guarding  Extremities: No edema, No lymphangitis, No petechiae, No rashes, no synovitis   Data Reviewed: I have personally reviewed following labs and imaging studies Basic Metabolic Panel: Recent Labs  Lab 06/12/18 0944 06/13/18 0359 06/14/18 0342  NA 143 146* 144  K 3.9 4.0 3.4*  CL 115* 116* 115*  CO2 20* 21* 22  GLUCOSE 121* 114* 107*  BUN 22 31* 30*  CREATININE 0.81 1.01* 0.99  CALCIUM 8.7* 8.5* 8.2*  MG  --   --  2.0   Liver Function Tests: Recent Labs  Lab 06/12/18 0944  AST 50*  ALT 42  ALKPHOS 108  BILITOT 1.0  PROT 6.6  ALBUMIN 3.6   No results for input(s): LIPASE, AMYLASE in the last 168 hours. No results for input(s): AMMONIA in the last 168 hours. Coagulation Profile: Recent Labs  Lab 06/12/18 0944  INR 1.17   CBC: Recent Labs  Lab 06/12/18 0944 06/13/18 0359  WBC 9.4 8.8  NEUTROABS 7.7  --   HGB 13.2 11.0*  HCT 44.8 35.9*  MCV 103.9* 100.0  PLT 273 226   Cardiac Enzymes: Recent Labs  Lab 06/12/18 0944 06/12/18 1915 06/13/18 0045  TROPONINI 0.03* 0.03* 0.03*   BNP: Invalid input(s): POCBNP CBG: Recent Labs  Lab 06/12/18 0905  GLUCAP 119*   HbA1C: No results for input(s): HGBA1C in the last 72 hours. Urine analysis:    Component Value Date/Time   COLORURINE YELLOW 06/12/2018 0859   APPEARANCEUR HAZY (A) 06/12/2018 0859    LABSPEC 1.015 06/12/2018 0859   PHURINE 6.0 06/12/2018 0859   GLUCOSEU NEGATIVE 06/12/2018 0859   HGBUR LARGE (A) 06/12/2018 0859   BILIRUBINUR NEGATIVE 06/12/2018 0859   BILIRUBINUR neg 03/27/2014 1241   [place] 06/12/2018 0859   PROTEINUR 100 (A) 06/12/2018 0859   UROBILINOGEN negative 03/27/2014 1241   UROBILINOGEN 0.2 02/16/2007 1911   NITRITE NEGATIVE 06/12/2018 0859   LEUKOCYTESUR NEGATIVE 06/12/2018 0859   Sepsis Labs: @LABRCNTIP (procalcitonin:4,lacticidven:4) ) Recent Results (from the past 240 hour(s))  Urine culture     Status: None   Collection Time: 06/12/18  9:13 AM  Result Value Ref Range Status   Specimen Description   Final    URINE, CATHETERIZED Performed at [facility], [address] [place], [place] [address]    Special Requests   Final    NONE Performed at [facility], [address] [place], [place] [address]    Culture   Final    NO GROWTH Performed at [facility], [place] [address] [place],  [address]    Report Status 06/13/2018 FINAL  Final  Culture, blood (Routine x 2)     Status: None (Preliminary result)   Collection Time: 06/12/18  9:45 AM  Result Value Ref Range Status   Specimen Description LEFT ANTECUBITAL  Final  Special Requests   Final    BOTTLES DRAWN AEROBIC AND ANAEROBIC Blood Culture adequate volume   Culture   Final    NO GROWTH 2 DAYS Performed at [facility], [address], [place], [place] [address]    Report Status PENDING  Incomplete  Culture, blood (Routine x 2)     Status: None (Preliminary result)   Collection Time: 06/12/18  9:45 AM  Result Value Ref Range Status   Specimen Description BLOOD RIGHT WRIST  Final   Special Requests   Final    BOTTLES DRAWN AEROBIC ONLY Blood Culture adequate volume   Culture   Final    NO GROWTH 2 DAYS Performed at [facility], [address] [place], [place] [address]    Report Status PENDING  Incomplete  MRSA PCR Screening     Status: Abnormal    Collection Time: 06/13/18 12:34 AM  Result Value Ref Range Status   MRSA by PCR POSITIVE (A) NEGATIVE Final    Comment:        The GeneXpert MRSA Assay (FDA approved for NASAL specimens only), is one component of a comprehensive MRSA colonization surveillance program. It is not intended to diagnose MRSA infection nor to guide or monitor treatment for MRSA infections. RESULT CALLED TO, READ BACK BY AND VERIFIED WITH: HEARN,J@0637  BY MATTHEWS,B 11.11.19 Performed at [facility], [address], [place], [place] [address]   Respiratory Panel by PCR     Status: None   Collection Time: 06/13/18 10:06 AM  Result Value Ref Range Status   Adenovirus NOT DETECTED NOT DETECTED Final   Coronavirus 229E NOT DETECTED NOT DETECTED Final   Coronavirus HKU1 NOT DETECTED NOT DETECTED Final   Coronavirus NL63 NOT DETECTED NOT DETECTED Final   Coronavirus OC43 NOT DETECTED NOT DETECTED Final   Metapneumovirus NOT DETECTED NOT DETECTED Final   Rhinovirus / Enterovirus NOT DETECTED NOT DETECTED Final   Influenza A NOT DETECTED NOT DETECTED Final   Influenza B NOT DETECTED NOT DETECTED Final   Parainfluenza Virus 1 NOT DETECTED NOT DETECTED Final   Parainfluenza Virus 2 NOT DETECTED NOT DETECTED Final   Parainfluenza Virus 3 NOT DETECTED NOT DETECTED Final   Parainfluenza Virus 4 NOT DETECTED NOT DETECTED Final   Respiratory Syncytial Virus NOT DETECTED NOT DETECTED Final   Bordetella pertussis NOT DETECTED NOT DETECTED Final   Chlamydophila pneumoniae NOT DETECTED NOT DETECTED Final   Mycoplasma pneumoniae NOT DETECTED NOT DETECTED Final    Comment: Performed at [facility], [place] [address], [place], [place] [address]     Scheduled Meds: . budesonide (PULMICORT) nebulizer solution  0.5 mg Nebulization BID  . chlorhexidine  15 mL Mouth Rinse BID  . enoxaparin (LOVENOX) injection  30 mg Subcutaneous Q24H  . furosemide  20 mg Oral Once  . ipratropium-albuterol  3 mL Nebulization TID  .  [START ON 06/15/2018] levofloxacin  500 mg Oral Daily  . levothyroxine  100 mcg Oral Q0600  . mupirocin ointment   Nasal BID  . potassium chloride  20 mEq Oral Once  . sertraline  100 mg Oral QHS  . sucralfate  1 g Oral TID WC & HS   Continuous Infusions: . azithromycin Stopped (06/13/18 1944)  . ceFEPime (MAXIPIME) IV 2 g (06/13/18 2206)  . vancomycin 1,000 mg (06/14/18 1139)    Procedures/Studies: Ct Chest Wo Contrast  Result Date: 06/13/2018 CLINICAL DATA:  Increasing shortness of breath with coughing congestion. EXAM: CT CHEST WITHOUT CONTRAST TECHNIQUE:  Multidetector CT imaging of the chest was performed following the standard protocol without IV contrast. COMPARISON:  06/03/2017 FINDINGS: Cardiovascular: The heart size is normal. No substantial pericardial effusion. Mediastinum/Nodes: No mediastinal lymphadenopathy. Soft tissue fullness noted in the hilar regions, not well evaluated without intravenous contrast material. There is no axillary lymphadenopathy. The esophagus has normal imaging features. Lungs/Pleura: Interstitial and patchy/nodular airspace disease is seen in both lungs with a central predominance. Scattered areas of associated tree-in-bud nodularity are evident. No pleural effusion. Upper Abdomen: Surgical changes are evident in the stomach. 6 mm stone in the upper pole right kidney is associated with a small 3 mm calculus. Musculoskeletal: No worrisome lytic or sclerotic osseous abnormality. Nondisplaced nonacute lower sternal fracture evident. IMPRESSION: 1. Patchy bilateral airspace disease with areas of tree-in-bud nodularity. Features likely related to multifocal pneumonia and atypical infection would be a consideration. Follow-up CT chest in 3 months recommended to ensure resolution. 2. Left renal stones. Electronically Signed   By: Misty Stanley M.D.   On: 06/13/2018 12:41   Dg Chest Port 1 View  Result Date: 06/13/2018 CLINICAL DATA:  Acute onset of respiratory  failure. Chest congestion and productive cough. Generalized weakness. EXAM: PORTABLE CHEST 1 VIEW COMPARISON:  Chest radiograph performed 06/12/2018 FINDINGS: The lungs are well-aerated. Vascular congestion is noted. Increased interstitial markings raise concern for mild pulmonary edema, though pneumonia could have a similar appearance. There is no evidence of pleural effusion or pneumothorax. The cardiomediastinal silhouette is within normal limits. No acute osseous abnormalities are seen. IMPRESSION: Vascular congestion noted. Increased interstitial markings raise concern for mild pulmonary edema, though pneumonia could have a similar appearance. Electronically Signed   By: Garald Balding M.D.   On: 06/13/2018 05:43   Dg Chest Port 1 View  Result Date: 06/12/2018 CLINICAL DATA:  67 year old female with anemia, congestion and sepsis EXAM: PORTABLE CHEST 1 VIEW COMPARISON:  Prior chest x-ray 12/29/2017 FINDINGS: Stable cardiac and mediastinal contours. Increased pulmonary vascular congestion with bilateral perihilar interstitial and airspace opacities. Findings suggest mild CHF. No pleural effusion or pneumothorax. Stable hyperinflation. No acute osseous abnormality. IMPRESSION: 1. Increased pulmonary vascular congestion now with bilateral perihilar, lingular and right middle lobe interstitial and airspace opacities. Overall, the findings are most suggestive of a mild CHF. However, in the appropriate clinical setting multifocal pneumonia involving the lingula and right middle lobe could be considered. 2. Pulmonary hyperinflation and chronic bronchitic changes. Electronically Signed   By: Jacqulynn Cadet M.D.   On: 06/12/2018 10:09    Orson Eva, DO  [facility] Pager [phone]  If 7PM-7AM, please contact night-coverage [email or web address] Password [email or web address] 06/14/2018, 2:13 PM   LOS: 2 days

## 2018-06-15 DIAGNOSIS — I5031 Acute diastolic (congestive) heart failure: Secondary | ICD-10-CM

## 2018-06-15 DIAGNOSIS — R55 Syncope and collapse: Secondary | ICD-10-CM

## 2018-06-15 DIAGNOSIS — E43 Unspecified severe protein-calorie malnutrition: Secondary | ICD-10-CM

## 2018-06-15 DIAGNOSIS — J9601 Acute respiratory failure with hypoxia: Secondary | ICD-10-CM

## 2018-06-15 DIAGNOSIS — J189 Pneumonia, unspecified organism: Secondary | ICD-10-CM

## 2018-06-15 DIAGNOSIS — J690 Pneumonitis due to inhalation of food and vomit: Principal | ICD-10-CM

## 2018-06-15 LAB — MAGNESIUM: Magnesium: 1.9 mg/dL (ref 1.7–2.4)

## 2018-06-15 LAB — BASIC METABOLIC PANEL
Anion gap: 7 (ref 5–15)
BUN: 25 mg/dL — ABNORMAL HIGH (ref 8–23)
CHLORIDE: 113 mmol/L — AB (ref 98–111)
CO2: 24 mmol/L (ref 22–32)
Calcium: 8.5 mg/dL — ABNORMAL LOW (ref 8.9–10.3)
Creatinine, Ser: 0.92 mg/dL (ref 0.44–1.00)
GFR calc non Af Amer: >60 mL/min (ref >60)
Glucose, Bld: 109 mg/dL — ABNORMAL HIGH (ref 70–99)
POTASSIUM: 3.3 mmol/L — AB (ref 3.5–5.1)
SODIUM: 144 mmol/L (ref 135–145)

## 2018-06-15 LAB — CBC
HEMATOCRIT: 39.5 % (ref 36.0–46.0)
HEMOGLOBIN: 11.8 g/dL — AB (ref 12.0–15.0)
MCH: 30.7 pg (ref 26.0–34.0)
MCHC: 29.9 g/dL — ABNORMAL LOW (ref 30.0–36.0)
MCV: 102.9 fL — AB (ref 80.0–100.0)
NRBC: 0 % (ref 0.0–0.2)
Platelets: 192 10*3/uL (ref 150–400)
RBC: 3.84 MIL/uL — AB (ref 3.87–5.11)
RDW: 15.2 % (ref 11.5–15.5)
WBC: 8 10*3/uL (ref 4.0–10.5)

## 2018-06-15 MED ORDER — ALBUTEROL SULFATE HFA 108 (90 BASE) MCG/ACT IN AERS: 2.0000 | INHALATION_SPRAY | Freq: Four times a day (QID) | RESPIRATORY_TRACT | 2 refills | Status: DC | PRN

## 2018-06-15 MED ORDER — LEVOFLOXACIN 500 MG PO TABS: 500.0000 mg | ORAL_TABLET | Freq: Every day | ORAL | 0 refills | Status: DC

## 2018-06-15 MED ORDER — GUAIFENESIN ER 600 MG PO TB12: 600.0000 mg | ORAL_TABLET | Freq: Two times a day (BID) | ORAL | 0 refills | Status: DC

## 2018-06-15 NOTE — Care Management Note (Signed)
Case Management Note  Patient Details  Name: Brittany Hensley MRN: [id number] Date of Birth: 29-Apr-1951  Subjective/Objective:  Acute respiratory failure with hypoxia -Multifactorial including pulmonary edema, pneumonia, and possible COPD; CHF. Lives alone but her son lives right next door and she has multiple family members who are in and out of her home multiple times a day. Per pt, she has a walker and does not feel that she has any other DME needs. She is requiring oxygen for DC. Offered Choice of DME agencies. She elects [facility] as she is familiar with them.                Action/Plan: Vaughan Basta of [facility] notified and will obtain orders via Epic and deliver portable tank to room. Patient has worked with PT - no recommendations/needs. Patient is on the [facility] registry and will have DC emmi calls.   Expected Discharge Date:    06/15/2018              Expected Discharge Plan:  Home/Self Care  In-House Referral:     Discharge planning Services  CM Consult  Post Acute Care Choice:  Durable Medical Equipment Choice offered to:  Patient  DME Arranged:  Oxygen DME Agency:  [place]:    [facility] Agency:     Status of Service:  Completed, signed off  If discussed at [place] of Stay Meetings, dates discussed:    Additional Comments:  Evens Meno, Chauncey Reading, RN 06/15/2018, 10:55 AM

## 2018-06-15 NOTE — Discharge Summary (Signed)
Physician Discharge Summary  Brittany Hensley DPO:[id number] DOB: 03/08/1951 DOA: 06/12/2018  PCP: Chevis Pretty, FNP  Admit date: 06/12/2018 Discharge date: 06/15/2018  Time spent: 35 minutes  Recommendations for Outpatient Follow-up:  1. Repeat basic metabolic panel to follow electrolytes and renal function 2. Reassess need for oxygen supplementation 3. Repeat chest x-ray in 4-6 weeks to assure resolution of infiltrates.   Discharge Diagnoses:  Active Problems:   Acute respiratory failure with hypoxia (HCC)   Lobar pneumonia (HCC)   Aspiration pneumonitis (HCC)   Acute diastolic CHF (congestive heart failure) (HCC)   Near syncope   Community acquired pneumonia   Protein-calorie malnutrition, severe ([place])   Discharge Condition: Stable and improved.  Patient discharged home with instruction to follow-up with PCP in 10 days. Arrangement for home oxygen made prior to discharge.  Diet recommendation: Low-sodium diet.  Filed Weights   06/14/18 0500 06/15/18 0500 06/15/18 1225  Weight: 36.7 kg 36.7 kg 38.2 kg    History of present illness:  As per H&P written by Dr. Carles Hensley on 06/12/2018  67 y.o. female with medical history of anxiety, Roux-en-Y gastrojejunostomy with anastomotic stricture, hypothyroidism, and iron deficiency anemia presenting with 2 to 3-day history of increasing shortness of breath, coughing, chest congestion that began on 06/10/2018 after she had an iron infusion.  The patient denied any fevers, chills, chest pain, abdominal pain.  She did have an episode of nausea and vomiting which is common for her after her iron infusions.  She denies any diarrhea, hematochezia, melena.  She has not had any further emesis.  On the morning of 06/12/2018, the patient continued to have worsening shortness of breath and generalized weakness.  As result, the patient was brought to emergency department for further evaluation.  She was noted to have respiratory distress with oxygen  saturation of 70 per 2% on room air.  She was placed on BiPAP.  Patient has never smoked in her lifetime, but was exposed to a significant amount of secondhand smoke from her husband. In the emergency department, the patient was afebrile and hemodynamically stable with oxygen saturation of 100% on BiPAP 50%.  She was tachycardic into the 120s.  Chest x-ray showed vascular congestion with possible airspace opacity in the left lower lobe and right middle lobe.  The patient was given furosemide 20 mg IV.  She was started on levofloxacin and vancomycin.  Solu-Medrol IV was also given.  Hospital Course:  1-acute respiratory failure with hypoxia -Appears to be multifactorial in the setting of acute pneumonia and vascular congestion from diastolic heart failure. -Patient initially in need of BiPAP with subsequent improvement to require only 2 L of oxygen supplementation prior to discharge -Will continue the use of Mucinex to achieve better pulmonary hygiene -Patient influenza and respiratory viral panel has been negative -Will discharge on Levaquin to complete antibiotic therapy for pneumonia.  2-acute diastolic heart failure -Patient with grade 1 diastolic heart failure -Lasix x2 were given with clinical improvement -Patient was negative for 0.1 kg -Advised to follow low-sodium diet -At discharge there was no signs of fluid overload and no crackles. -Patient discharged without diuretics therapy.  3-lobar pneumonia/aspiration pneumonitis -As mentioned above initially received cefepime and, vancomycin and Zithromax -Lactic acid within normal limits and improvement in her procalcitonin with intervention -She received 3 days of steroids, as needed duo nebs and Pulmicort. -At discharge will complete treatment with Levaquin and as needed albuterol.  4-hypothyroidism -Continue oral levothyroxine  5-pyuria -Patient denies dysuria -Patient was initially  on cefepime -Urine culture negative. -Advised  to maintain adequate hydration.  6-anxiety -Resume home alprazolam.  7-stage II sacral pressure injury -No signs of acute superimposed infection -Continue local wound care and preventive measurements.  8-severe protein calorie malnutrition -Patient instructed to improve nutrition intake -Ensure twice a day has been encouraged.  9-hypokalemia -In the setting of poor p.o. intake and the use of diuretics -Repleted and within normal limits at discharge -Repeat basic metabolic panel at follow-up visit to reassess electrolytes trend.  Procedures:  See below for x-ray reports.  Consultations:  None  Discharge Exam: Vitals:   06/15/18 1225 06/15/18 1441  BP: 118/77   Pulse: 68   Resp: 18   Temp: 97.6 F (36.4 C)   SpO2: 96% 95%    General: Afebrile, reports improvement in her breathing; using 2 L nasal cannula supplementation.  Still desaturating in the low 80s on room air.  No nausea, no vomiting, no abdominal pain.  Patient underweight in appearance. Cardiovascular: S1 and S2, no rubs, no gallops, no JVD Respiratory: Positive rhonchi, no wheezing, improved air movement bilaterally.  No using accessory muscles. Abdomen: Soft, nontender, nondistended, positive bowel sounds Extremities: No edema, no cyanosis or clubbing.  Discharge Instructions   Discharge Instructions    Diet - low sodium heart healthy   Complete by:  As directed    Discharge instructions   Complete by:  As directed    Take medications as prescribed Keep yourself well-hydrated Complete antibiotic therapy as instructed Arrange follow-up with PCP in 10 days.     Allergies as of 06/15/2018      Reactions   Augmentin [amoxicillin-pot Clavulanate] Other (See Comments)   Bloody stool Has patient had a PCN reaction causing immediate rash, facial/tongue/throat swelling, SOB or lightheadedness with hypotension: No Has patient had a PCN reaction causing severe rash involving mucus membranes or skin  necrosis: No Has patient had a PCN reaction that required hospitalization: No Has patient had a PCN reaction occurring within the last 10 years: Yes If all of the above answers are "NO", then may proceed with Cephalosporin use.   Famotidine Other (See Comments)   Fever   Klonopin [clonazepam] Other (See Comments)   REACTION: double vision   Reglan [metoclopramide]    Causes confusion, anxiety   Nitrofurantoin Rash   Sulfamethoxazole-trimethoprim Rash      Medication List    TAKE these medications   acetaminophen 500 MG tablet Commonly known as:  TYLENOL Take 500 mg by mouth 2 (two) times daily.   albuterol 108 (90 Base) MCG/ACT inhaler Commonly known as:  PROVENTIL HFA;VENTOLIN HFA Inhale 2 puffs into the lungs every 6 (six) hours as needed for wheezing or shortness of breath.   ALPRAZolam 0.5 MG tablet Commonly known as:  XANAX Take 1 tablet (0.5 mg total) by mouth 4 (four) times daily as needed for anxiety.   aspirin 325 MG EC tablet Take 1 tablet (325 mg total) by mouth 2 (two) times daily. What changed:  when to take this   CALCIUM 600 PO Take 600 mg by mouth 2 (two) times daily.   CENTRUM SILVER Chew Chew 1 tablet by mouth 2 (two) times daily.   docusate sodium 100 MG capsule Commonly known as:  COLACE Take 1 capsule (100 mg total) by mouth 2 (two) times daily. What changed:  when to take this   Flaxseed Oil 1200 MG Caps Take 1,200 mg by mouth daily with lunch.   guaiFENesin 600 MG 12 hr  tablet Commonly known as:  MUCINEX Take 1 tablet (600 mg total) by mouth 2 (two) times daily.   levofloxacin 500 MG tablet Commonly known as:  LEVAQUIN Take 1 tablet (500 mg total) by mouth daily for 4 days. Start taking on:  06/16/2018   levothyroxine 100 MCG tablet Commonly known as:  SYNTHROID, LEVOTHROID Take 1 tablet (100 mcg total) by mouth every morning.   loratadine 10 MG tablet Commonly known as:  CLARITIN Take 10 mg by mouth 2 (two) times daily.    omeprazole 40 MG capsule Commonly known as:  PRILOSEC Take 1 capsule (40 mg total) by mouth daily.   PROBIOTIC PO Take 1 capsule by mouth daily with lunch.   sertraline 100 MG tablet Commonly known as:  ZOLOFT Take 1 tablet (100 mg total) by mouth at bedtime.   sucralfate 1 g tablet Commonly known as:  CARAFATE TAKE ONE (1) TABLET THREE (3) TIMES EACH DAY What changed:  See the new instructions.   Vitamin C 500 MG Chew Chew 500 mg by mouth 2 (two) times daily.   Vitamin D 50 MCG (2000 UT) tablet Take 2,000 Units by mouth 2 (two) times daily.            Durable Medical Equipment  (From admission, onward)         Start     Ordered   06/15/18 1054  For home use only DME oxygen  Once    Question Answer Comment  Mode or (Route) Nasal cannula   Liters per Minute 2   Frequency Continuous (stationary and portable oxygen unit needed)   Oxygen conserving device Yes   Oxygen delivery system Gas      06/15/18 1053         Allergies  Allergen Reactions  . Augmentin [Amoxicillin-Pot Clavulanate] Other (See Comments)    Bloody stool Has patient had a PCN reaction causing immediate rash, facial/tongue/throat swelling, SOB or lightheadedness with hypotension: No Has patient had a PCN reaction causing severe rash involving mucus membranes or skin necrosis: No Has patient had a PCN reaction that required hospitalization: No Has patient had a PCN reaction occurring within the last 10 years: Yes If all of the above answers are "NO", then may proceed with Cephalosporin use.   . Famotidine Other (See Comments)    Fever   . Klonopin [Clonazepam] Other (See Comments)    REACTION: double vision  . Reglan [Metoclopramide]     Causes confusion, anxiety  . Nitrofurantoin Rash  . Sulfamethoxazole-Trimethoprim Rash   Follow-up Information    Chevis Pretty, FNP. Schedule an appointment as soon as possible for a visit in 10 day(s).   Specialty:  Family Medicine Contact  information: [place] [address] [phone]           The results of significant diagnostics from this hospitalization (including imaging, microbiology, ancillary and laboratory) are listed below for reference.    Significant Diagnostic Studies: Ct Chest Wo Contrast  Result Date: 06/13/2018 CLINICAL DATA:  Increasing shortness of breath with coughing congestion. EXAM: CT CHEST WITHOUT CONTRAST TECHNIQUE: Multidetector CT imaging of the chest was performed following the standard protocol without IV contrast. COMPARISON:  06/03/2017 FINDINGS: Cardiovascular: The heart size is normal. No substantial pericardial effusion. Mediastinum/Nodes: No mediastinal lymphadenopathy. Soft tissue fullness noted in the hilar regions, not well evaluated without intravenous contrast material. There is no axillary lymphadenopathy. The esophagus has normal imaging features. Lungs/Pleura: Interstitial and patchy/nodular airspace disease is seen  in both lungs with a central predominance. Scattered areas of associated tree-in-bud nodularity are evident. No pleural effusion. Upper Abdomen: Surgical changes are evident in the stomach. 6 mm stone in the upper pole right kidney is associated with a small 3 mm calculus. Musculoskeletal: No worrisome lytic or sclerotic osseous abnormality. Nondisplaced nonacute lower sternal fracture evident. IMPRESSION: 1. Patchy bilateral airspace disease with areas of tree-in-bud nodularity. Features likely related to multifocal pneumonia and atypical infection would be a consideration. Follow-up CT chest in 3 months recommended to ensure resolution. 2. Left renal stones. Electronically Signed   By: Misty Stanley M.D.   On: 06/13/2018 12:41   Dg Chest Port 1 View  Result Date: 06/13/2018 CLINICAL DATA:  Acute onset of respiratory failure. Chest congestion and productive cough. Generalized weakness. EXAM: PORTABLE CHEST 1 VIEW COMPARISON:  Chest radiograph performed  06/12/2018 FINDINGS: The lungs are well-aerated. Vascular congestion is noted. Increased interstitial markings raise concern for mild pulmonary edema, though pneumonia could have a similar appearance. There is no evidence of pleural effusion or pneumothorax. The cardiomediastinal silhouette is within normal limits. No acute osseous abnormalities are seen. IMPRESSION: Vascular congestion noted. Increased interstitial markings raise concern for mild pulmonary edema, though pneumonia could have a similar appearance. Electronically Signed   By: Garald Balding M.D.   On: 06/13/2018 05:43   Dg Chest Port 1 View  Result Date: 06/12/2018 CLINICAL DATA:  67 year old female with anemia, congestion and sepsis EXAM: PORTABLE CHEST 1 VIEW COMPARISON:  Prior chest x-ray 12/29/2017 FINDINGS: Stable cardiac and mediastinal contours. Increased pulmonary vascular congestion with bilateral perihilar interstitial and airspace opacities. Findings suggest mild CHF. No pleural effusion or pneumothorax. Stable hyperinflation. No acute osseous abnormality. IMPRESSION: 1. Increased pulmonary vascular congestion now with bilateral perihilar, lingular and right middle lobe interstitial and airspace opacities. Overall, the findings are most suggestive of a mild CHF. However, in the appropriate clinical setting multifocal pneumonia involving the lingula and right middle lobe could be considered. 2. Pulmonary hyperinflation and chronic bronchitic changes. Electronically Signed   By: Jacqulynn Cadet M.D.   On: 06/12/2018 10:09    Microbiology: Recent Results (from the past 240 hour(s))  Urine culture     Status: None   Collection Time: 06/12/18  9:13 AM  Result Value Ref Range Status   Specimen Description   Final    URINE, CATHETERIZED Performed at [facility], [address] [place], [place] [address]    Special Requests   Final    NONE Performed at [facility], [address], [place], [place] [address]    Culture    Final    NO GROWTH Performed at [facility], [place] [address] [place], [place] [address]    Report Status 06/13/2018 FINAL  Final  Culture, blood (Routine x 2)     Status: None (Preliminary result)   Collection Time: 06/12/18  9:45 AM  Result Value Ref Range Status   Specimen Description LEFT ANTECUBITAL  Final   Special Requests   Final    BOTTLES DRAWN AEROBIC AND ANAEROBIC Blood Culture adequate volume   Culture   Final    NO GROWTH 3 DAYS Performed at [facility], [address] [place],  [address]    Report Status PENDING  Incomplete  Culture, blood (Routine x 2)     Status: None (Preliminary result)   Collection Time: 06/12/18  9:45 AM  Result Value Ref Range Status   Specimen Description BLOOD RIGHT WRIST  Final  Special Requests   Final    BOTTLES DRAWN AEROBIC ONLY Blood Culture adequate volume   Culture   Final    NO GROWTH 3 DAYS Performed at [facility], [address], [place], [place] [address]    Report Status PENDING  Incomplete  MRSA PCR Screening     Status: Abnormal   Collection Time: 06/13/18 12:34 AM  Result Value Ref Range Status   MRSA by PCR POSITIVE (A) NEGATIVE Final    Comment:        The GeneXpert MRSA Assay (FDA approved for NASAL specimens only), is one component of a comprehensive MRSA colonization surveillance program. It is not intended to diagnose MRSA infection nor to guide or monitor treatment for MRSA infections. RESULT CALLED TO, READ BACK BY AND VERIFIED WITH: HEARN,J@[id number]  BY MATTHEWS,B 11.11.19 Performed at [facility], [address], [place], [place] [address]   Respiratory Panel by PCR     Status: None   Collection Time: 06/13/18 10:06 AM  Result Value Ref Range Status   Adenovirus NOT DETECTED NOT DETECTED Final   Coronavirus 229E NOT DETECTED NOT DETECTED Final   Coronavirus HKU1 NOT DETECTED NOT DETECTED Final   Coronavirus NL63 NOT DETECTED NOT DETECTED Final   Coronavirus OC43 NOT DETECTED NOT DETECTED  Final   Metapneumovirus NOT DETECTED NOT DETECTED Final   Rhinovirus / Enterovirus NOT DETECTED NOT DETECTED Final   Influenza A NOT DETECTED NOT DETECTED Final   Influenza B NOT DETECTED NOT DETECTED Final   Parainfluenza Virus 1 NOT DETECTED NOT DETECTED Final   Parainfluenza Virus 2 NOT DETECTED NOT DETECTED Final   Parainfluenza Virus 3 NOT DETECTED NOT DETECTED Final   Parainfluenza Virus 4 NOT DETECTED NOT DETECTED Final   Respiratory Syncytial Virus NOT DETECTED NOT DETECTED Final   Bordetella pertussis NOT DETECTED NOT DETECTED Final   Chlamydophila pneumoniae NOT DETECTED NOT DETECTED Final   Mycoplasma pneumoniae NOT DETECTED NOT DETECTED Final    Comment: Performed at [facility], [place] [address] [place],  [address]     Labs: Basic Metabolic Panel: Recent Labs  Lab 06/12/18 0944 06/13/18 0359 06/14/18 0342 06/15/18 0406  NA 143 146* 144 144  K 3.9 4.0 3.4* 3.3*  CL 115* 116* 115* 113*  CO2 20* 21* 22 24  GLUCOSE 121* 114* 107* 109*  BUN 22 31* 30* 25*  CREATININE 0.81 1.01* 0.99 0.92  CALCIUM 8.7* 8.5* 8.2* 8.5*  MG  --   --  2.0 1.9   Liver Function Tests: Recent Labs  Lab 06/12/18 0944  AST 50*  ALT 42  ALKPHOS 108  BILITOT 1.0  PROT 6.6  ALBUMIN 3.6   CBC: Recent Labs  Lab 06/12/18 0944 06/13/18 0359 06/15/18 0406  WBC 9.4 8.8 8.0  NEUTROABS 7.7  --   --   HGB 13.2 11.0* 11.8*  HCT 44.8 35.9* 39.5  MCV 103.9* 100.0 102.9*  PLT 273 226 192   Cardiac Enzymes: Recent Labs  Lab 06/12/18 0944 06/12/18 1915 06/13/18 0045  TROPONINI 0.03* 0.03* 0.03*   BNP: BNP (last 3 results) Recent Labs    06/12/18 0944 06/14/18 0342  BNP 579.0* 125.0*   CBG: Recent Labs  Lab 06/12/18 0905  GLUCAP 119*    Signed:  Barton Dubois MD.  [facility] 06/15/2018, 3:56 PM

## 2018-06-15 NOTE — Evaluation (Signed)
Physical Therapy Evaluation Patient Details Name: Brittany Hensley MRN: [id number] DOB: 29-Sep-1950 Today's Date: 06/15/2018   History of Present Illness  Brittany Hensley is a 67 y.o. female with medical history of anxiety, Roux-en-Y gastrojejunostomy with anastomotic stricture, hypothyroidism, and iron deficiency anemia presenting with 2 to 3-day history of increasing shortness of breath, coughing, chest congestion that began on 06/10/2018 after she had an iron infusion.  The patient denied any fevers, chills, chest pain, abdominal pain.  She did have an episode of nausea and vomiting which is common for her after her iron infusions.  She denies any diarrhea, hematochezia, melena.  She has not had any further emesis.  On the morning of 06/12/2018, the patient continued to have worsening shortness of breath and generalized weakness.  As result, the patient was brought to emergency department for further evaluation.  She was noted to have respiratory distress with oxygen saturation of 70 per 2% on room air.  She was placed on BiPAP.  Patient has never smoked in her lifetime, but was exposed to a significant amount of secondhand smoke from her husband.    Clinical Impression  Patient functioning near baseline for functional mobility and gait, other than O2 desaturation below 80% when ambulating on room air, required 2 LPM to keep above 90% - RN notified.  Plan:  Patient discharged from physical therapy to care of nursing for ambulation daily as tolerated for length of stay.     Follow Up Recommendations No PT follow up;Supervision - Intermittent    Equipment Recommendations  None recommended by PT    Recommendations for Other Services       Precautions / Restrictions Precautions Precautions: None Restrictions Weight Bearing Restrictions: No      Mobility  Bed Mobility Overal bed mobility: Modified Independent                Transfers Overall transfer level: Modified independent                General transfer comment: increased time  Ambulation/Gait Ambulation/Gait assistance: Supervision Gait Distance (Feet): 100 Feet Assistive device: None Gait Pattern/deviations: WFL(Within Functional Limits) Gait velocity: decreased   General Gait Details: demonstrates good return for ambuationg in hallways witout loss of balance, while on room air O2 saturation dropped below 80%, required 2 LPM O2 to walk back to room to keep above 90%  Stairs            Wheelchair Mobility    Modified Rankin (Stroke Patients Only)       Balance Overall balance assessment: Mild deficits observed, not formally tested                                           Pertinent Vitals/Pain Pain Assessment: No/denies pain    Home Living Family/patient expects to be discharged to:: Private residence Living Arrangements: Alone Available Help at Discharge: Family Type of Home: House Home Access: Stairs to enter Entrance Stairs-Rails: Left Entrance Stairs-Number of Steps: 3 Home Layout: One level Home Equipment: Environmental consultant - 2 wheels;Shower seat      Prior Function Level of Independence: Needs assistance   Gait / Transfers Assistance Needed: community ambulator  ADL's / Homemaking Assistance Needed: her brother and son lives next to her and can assist if needed        Hand Dominance  Extremity/Trunk Assessment   Upper Extremity Assessment Upper Extremity Assessment: Overall WFL for tasks assessed    Lower Extremity Assessment Lower Extremity Assessment: Overall WFL for tasks assessed    Cervical / Trunk Assessment Cervical / Trunk Assessment: Normal  Communication   Communication: No difficulties  Cognition Arousal/Alertness: Awake/alert Behavior During Therapy: WFL for tasks assessed/performed Overall Cognitive Status: Within Functional Limits for tasks assessed                                        General  Comments      Exercises     Assessment/Plan    PT Assessment Patent does not need any further PT services  PT Problem List         PT Treatment Interventions      PT Goals (Current goals can be found in the Care Plan section)  Acute Rehab PT Goals Patient Stated Goal: return home PT Goal Formulation: With patient Time For Goal Achievement: 06/15/18 Potential to Achieve Goals: Good    Frequency     Barriers to discharge        Co-evaluation               AM-PAC PT "6 Clicks" Daily Activity  Outcome Measure Difficulty turning over in bed (including adjusting bedclothes, sheets and blankets)?: None Difficulty moving from lying on back to sitting on the side of the bed? : None Difficulty sitting down on and standing up from a chair with arms (e.g., wheelchair, bedside commode, etc,.)?: None Help needed moving to and from a bed to chair (including a wheelchair)?: None Help needed walking in hospital room?: None Help needed climbing 3-5 steps with a railing? : A Little 6 Click Score: 23    End of Session Equipment Utilized During Treatment: Oxygen Activity Tolerance: Patient tolerated treatment well;Patient limited by fatigue Patient left: in bed;with call bell/phone within reach Nurse Communication: Mobility status PT Visit Diagnosis: Unsteadiness on feet (R26.81);Other abnormalities of gait and mobility (R26.89);Muscle weakness (generalized) (M62.81)    Time: 0981-1914 PT Time Calculation (min) (ACUTE ONLY): 26 min   Charges:   PT Evaluation $PT Eval Moderate Complexity: 1 Mod PT Treatments $Therapeutic Activity: 23-37 mins        3:04 PM, 06/15/18 Lonell Grandchild, MPT Physical Therapist with [facility] [phone] office [phone] mobile phone

## 2018-06-15 NOTE — Progress Notes (Signed)
SATURATION QUALIFICATIONS: (This note is used to comply with regulatory documentation for home oxygen)  Patient Saturations on Room Air at Rest = 92%  Patient Saturations on Room Air while Ambulating = 87%  Patient Saturations on 2 Liters of oxygen while Ambulating = 90%  Please briefly explain why patient needs home oxygen: pt desats on room air. Requires 2L on exertion.

## 2018-06-15 NOTE — Care Management Important Message (Signed)
Important Message  Patient Details  Name: Brittany Hensley MRN: [id number] Date of Birth: Feb 03, 1951   Medicare Important Message Given:  Yes    Levante Simones, Chauncey Reading, RN 06/15/2018, 1:51 PM

## 2018-06-16 ENCOUNTER — Telehealth: Payer: Self-pay | Admitting: *Deleted

## 2018-06-16 NOTE — Telephone Encounter (Signed)
Left message on home phone for patient to return my call to schedule a transitional care visit with Chevis Pretty, FNP.  Multiple attempts at contact were made within 2 business days of discharge. Messages left but phone calls were not returned. Also attempted to contact on 06/21/18 on a different number. There was no answer and the voicemail stated a name other than the patient's.  Pre-existing follow up appointment was Scheduled for: Yes, Date: 06/27/2018 with Particia Nearing, PA-C. TCM may be done at that visit.   Medication reconciliation will need to be done at the visit as well as the following recommendations.   Recommendations for Outpatient Follow-up:  1. Repeat basic metabolic panel to follow electrolytes and renal function 2. Reassess need for oxygen supplementation 3. Repeat chest x-ray in 4-6 weeks to assure resolution of infiltrates.  DISCHARGE INFORMATION Date of Discharge:06/15/18  Discharge Facility: [facility]  Principal Discharge Diagnosis: Acute respiratory failure, pneumonia, near syncope   Chong Sicilian, RN

## 2018-06-17 ENCOUNTER — Ambulatory Visit (INDEPENDENT_AMBULATORY_CARE_PROVIDER_SITE_OTHER): Payer: PPO | Admitting: Physician Assistant

## 2018-06-17 ENCOUNTER — Encounter: Payer: Self-pay | Admitting: Physician Assistant

## 2018-06-17 VITALS — BP 97/60 | HR 112 | Temp 96.7°F | Ht 65.0 in | Wt 80.0 lb

## 2018-06-17 DIAGNOSIS — J189 Pneumonia, unspecified organism: Secondary | ICD-10-CM

## 2018-06-17 DIAGNOSIS — J9801 Acute bronchospasm: Secondary | ICD-10-CM

## 2018-06-17 LAB — CULTURE, BLOOD (ROUTINE X 2)
CULTURE: NO GROWTH
Culture: NO GROWTH
SPECIAL REQUESTS: ADEQUATE
SPECIAL REQUESTS: ADEQUATE

## 2018-06-17 MED ORDER — LEVOFLOXACIN 500 MG PO TABS: 500.0000 mg | ORAL_TABLET | Freq: Every day | ORAL | 0 refills | Status: AC

## 2018-06-17 MED ORDER — METHYLPREDNISOLONE ACETATE 40 MG/ML IJ SUSP
40.0000 mg | Freq: Once | INTRAMUSCULAR | Status: AC
  Administered 2018-06-17: 40 mg via INTRAMUSCULAR

## 2018-06-17 MED ORDER — LEVOFLOXACIN 500 MG PO TABS: 500.0000 mg | ORAL_TABLET | Freq: Every day | ORAL | 0 refills | Status: DC

## 2018-06-17 NOTE — Progress Notes (Signed)
BP 97/60   Pulse (!) 112   Temp (!) 96.7 F (35.9 C) (Oral)   Ht 5\' 5"  (1.651 m)   Wt 80 lb (36.3 kg)   SpO2 92% Comment: on 2 liters  BMI 13.31 kg/m    Subjective:    Patient ID: Brittany Hensley, female    DOB: 07-03-1951, 67 y.o.   MRN: [id number]  HPI: Brittany Hensley is a 67 y.o. female presenting on 06/17/2018 for Hospitalization Follow-up (was in for pneumonia ) and Shortness of Breath  This patient is brought in in a wheelchair by her caregiver.  She is oxygen dependent.  She had a recent hospitalization for a pneumonia.  She states she is not feeling completely better and the antibiotic will be out in just another day or so.  While she was there, CT was performed and showed pneumonia to be present.  She reports that she was given vancomycin and nebulizer treatments while at the hospital.  She does have an albuterol inhaler at home and has been using it 4 times a day.  I have discussed with her to use it every 4 hours as needed.  She reports however that she is not feeling much better as she expected to.  We are going to extend her treatment.  And I am also going to add in Symbicort for further treatment of the inflammation in her lungs.  We will plan to see her back in 10 days.  Past Medical History:  Diagnosis Date  . Anemia    iron   . Anxiety   . Diverticulitis    Hx: of  . GERD (gastroesophageal reflux disease)    at times  . History of kidney stones    10 years ago  . Hypothyroidism   . Low iron   . Pneumonia   . Rosacea    Hx: of   Relevant past medical, surgical, family and social history reviewed and updated as indicated. Interim medical history since our last visit reviewed. Allergies and medications reviewed and updated. DATA REVIEWED: CHART IN EPIC  Family History reviewed for pertinent findings.  Review of Systems  Constitutional: Positive for chills and fatigue. Negative for activity change, appetite change and fever.  HENT: Positive for  congestion, postnasal drip and sore throat.   Eyes: Negative.   Respiratory: Positive for cough, shortness of breath and wheezing.   Cardiovascular: Negative.  Negative for chest pain, palpitations and leg swelling.  Gastrointestinal: Negative.   Genitourinary: Negative.   Musculoskeletal: Negative.   Skin: Negative.   Neurological: Positive for headaches.    Allergies as of 06/17/2018      Reactions   Augmentin [amoxicillin-pot Clavulanate] Other (See Comments)   Bloody stool Has patient had a PCN reaction causing immediate rash, facial/tongue/throat swelling, SOB or lightheadedness with hypotension: No Has patient had a PCN reaction causing severe rash involving mucus membranes or skin necrosis: No Has patient had a PCN reaction that required hospitalization: No Has patient had a PCN reaction occurring within the last 10 years: Yes If all of the above answers are "NO", then may proceed with Cephalosporin use.   Famotidine Other (See Comments)   Fever   Klonopin [clonazepam] Other (See Comments)   REACTION: double vision   Reglan [metoclopramide]    Causes confusion, anxiety   Nitrofurantoin Rash   Sulfamethoxazole-trimethoprim Rash      Medication List        Accurate as of 06/17/18  6:54  PM. Always use your most recent med list.          acetaminophen 500 MG tablet Commonly known as:  TYLENOL Take 500 mg by mouth 2 (two) times daily.   albuterol 108 (90 Base) MCG/ACT inhaler Commonly known as:  PROVENTIL HFA;VENTOLIN HFA Inhale 2 puffs into the lungs every 6 (six) hours as needed for wheezing or shortness of breath.   ALPRAZolam 0.5 MG tablet Commonly known as:  XANAX Take 1 tablet (0.5 mg total) by mouth 4 (four) times daily as needed for anxiety.   aspirin 325 MG EC tablet Take 1 tablet (325 mg total) by mouth 2 (two) times daily.   CALCIUM 600 PO Take 600 mg by mouth 2 (two) times daily.   CENTRUM SILVER Chew Chew 1 tablet by mouth 2 (two) times daily.    docusate sodium 100 MG capsule Commonly known as:  COLACE Take 1 capsule (100 mg total) by mouth 2 (two) times daily.   Flaxseed Oil 1200 MG Caps Take 1,200 mg by mouth daily with lunch.   guaiFENesin 600 MG 12 hr tablet Commonly known as:  MUCINEX Take 1 tablet (600 mg total) by mouth 2 (two) times daily.   levofloxacin 500 MG tablet Commonly known as:  LEVAQUIN Take 1 tablet (500 mg total) by mouth daily for 10 days.   levothyroxine 100 MCG tablet Commonly known as:  SYNTHROID, LEVOTHROID Take 1 tablet (100 mcg total) by mouth every morning.   loratadine 10 MG tablet Commonly known as:  CLARITIN Take 10 mg by mouth 2 (two) times daily.   omeprazole 40 MG capsule Commonly known as:  PRILOSEC Take 1 capsule (40 mg total) by mouth daily.   OXYGEN Inhale 2 L into the lungs.   PROBIOTIC PO Take 1 capsule by mouth daily with lunch.   sertraline 100 MG tablet Commonly known as:  ZOLOFT Take 1 tablet (100 mg total) by mouth at bedtime.   sucralfate 1 g tablet Commonly known as:  CARAFATE TAKE ONE (1) TABLET THREE (3) TIMES EACH DAY   Vitamin C 500 MG Chew Chew 500 mg by mouth 2 (two) times daily.   Vitamin D 50 MCG (2000 UT) tablet Take 2,000 Units by mouth 2 (two) times daily.          Objective:    BP 97/60   Pulse (!) 112   Temp (!) 96.7 F (35.9 C) (Oral)   Ht 5\' 5"  (1.651 m)   Wt 80 lb (36.3 kg)   SpO2 92% Comment: on 2 liters  BMI 13.31 kg/m   Allergies  Allergen Reactions  . Augmentin [Amoxicillin-Pot Clavulanate] Other (See Comments)    Bloody stool Has patient had a PCN reaction causing immediate rash, facial/tongue/throat swelling, SOB or lightheadedness with hypotension: No Has patient had a PCN reaction causing severe rash involving mucus membranes or skin necrosis: No Has patient had a PCN reaction that required hospitalization: No Has patient had a PCN reaction occurring within the last 10 years: Yes If all of the above answers are "NO",  then may proceed with Cephalosporin use.   . Famotidine Other (See Comments)    Fever   . Klonopin [Clonazepam] Other (See Comments)    REACTION: double vision  . Reglan [Metoclopramide]     Causes confusion, anxiety  . Nitrofurantoin Rash  . Sulfamethoxazole-Trimethoprim Rash    Wt Readings from Last 3 Encounters:  06/17/18 80 lb (36.3 kg)  06/15/18 84 lb 3.5 oz (38.2 kg)  06/08/18 92 lb 9.6 oz (42 kg)    Physical Exam  Constitutional: She is oriented to person, place, and time. She appears well-developed and well-nourished.  HENT:  Head: Normocephalic and atraumatic.  Right Ear: There is drainage and tenderness.  Left Ear: There is drainage and tenderness.  Nose: Mucosal edema and rhinorrhea present. Right sinus exhibits no maxillary sinus tenderness and no frontal sinus tenderness. Left sinus exhibits no maxillary sinus tenderness and no frontal sinus tenderness.  Mouth/Throat: Oropharyngeal exudate and posterior oropharyngeal erythema present.  Eyes: Pupils are equal, round, and reactive to light. Conjunctivae and EOM are normal.  Neck: Normal range of motion. Neck supple.  Cardiovascular: Normal rate, regular rhythm, normal heart sounds and intact distal pulses.  Pulmonary/Chest: Effort normal. She has wheezes in the right upper field and the left upper field.  Abdominal: Soft. Bowel sounds are normal.  Neurological: She is alert and oriented to person, place, and time. She has normal reflexes.  Skin: Skin is warm and dry. No rash noted.  Psychiatric: She has a normal mood and affect. Her behavior is normal. Judgment and thought content normal.        Assessment & Plan:   1. Pneumonia of both lungs due to infectious organism, unspecified part of lung - levofloxacin (LEVAQUIN) 500 MG tablet; Take 1 tablet (500 mg total) by mouth daily for 10 days.  Dispense: 10 tablet; Refill: 0 - methylPREDNISolone acetate (DEPO-MEDROL) injection 40 mg  2. Bronchospasm -  methylPREDNISolone acetate (DEPO-MEDROL) injection 40 mg    Continue all other maintenance medications as listed above.  Follow up plan: Return in about 10 days (around 06/27/2018).  Educational handout given for Chase PA-C [place] [address]  [place], [place] [address] [phone]   06/17/2018, 6:54 PM

## 2018-06-20 ENCOUNTER — Other Ambulatory Visit: Payer: Self-pay | Admitting: Physician Assistant

## 2018-06-20 ENCOUNTER — Telehealth: Payer: Self-pay | Admitting: Nurse Practitioner

## 2018-06-20 MED ORDER — PREDNISONE 10 MG PO TABS: 10.0000 mg | ORAL_TABLET | Freq: Every day | ORAL | 0 refills | Status: DC

## 2018-06-20 NOTE — Telephone Encounter (Signed)
Pt notified of RX 

## 2018-06-20 NOTE — Telephone Encounter (Signed)
Sent medication to pharmacy.   

## 2018-06-21 ENCOUNTER — Ambulatory Visit: Payer: PPO | Admitting: Family Medicine

## 2018-06-24 DIAGNOSIS — I509 Heart failure, unspecified: Secondary | ICD-10-CM | POA: Diagnosis not present

## 2018-06-24 DIAGNOSIS — I5031 Acute diastolic (congestive) heart failure: Secondary | ICD-10-CM | POA: Diagnosis not present

## 2018-06-24 DIAGNOSIS — J9601 Acute respiratory failure with hypoxia: Secondary | ICD-10-CM | POA: Diagnosis not present

## 2018-06-27 ENCOUNTER — Encounter: Payer: Self-pay | Admitting: Physician Assistant

## 2018-06-27 ENCOUNTER — Ambulatory Visit (INDEPENDENT_AMBULATORY_CARE_PROVIDER_SITE_OTHER): Payer: PPO | Admitting: Physician Assistant

## 2018-06-27 VITALS — BP 125/59 | HR 92 | Temp 97.2°F | Ht 65.0 in | Wt 87.8 lb

## 2018-06-27 DIAGNOSIS — J181 Lobar pneumonia, unspecified organism: Secondary | ICD-10-CM | POA: Diagnosis not present

## 2018-06-27 DIAGNOSIS — J9601 Acute respiratory failure with hypoxia: Secondary | ICD-10-CM

## 2018-06-27 DIAGNOSIS — F411 Generalized anxiety disorder: Secondary | ICD-10-CM

## 2018-06-27 DIAGNOSIS — D649 Anemia, unspecified: Secondary | ICD-10-CM | POA: Insufficient documentation

## 2018-06-27 DIAGNOSIS — D500 Iron deficiency anemia secondary to blood loss (chronic): Secondary | ICD-10-CM | POA: Diagnosis not present

## 2018-06-27 MED ORDER — SUCRALFATE 1 G PO TABS: 1.0000 g | ORAL_TABLET | Freq: Three times a day (TID) | ORAL | 2 refills | Status: DC

## 2018-06-27 MED ORDER — PREDNISONE 10 MG PO TABS: 10.0000 mg | ORAL_TABLET | Freq: Every day | ORAL | 0 refills | Status: DC

## 2018-06-27 MED ORDER — FLUTICASONE FUROATE-VILANTEROL 200-25 MCG/INH IN AEPB: 1.0000 | INHALATION_SPRAY | RESPIRATORY_TRACT | 12 refills | Status: AC

## 2018-06-27 MED ORDER — ALPRAZOLAM 0.5 MG PO TABS: 0.5000 mg | ORAL_TABLET | Freq: Four times a day (QID) | ORAL | 5 refills | Status: DC | PRN

## 2018-06-27 NOTE — Progress Notes (Signed)
BP (!) 125/59   Pulse 92   Temp (!) 97.2 F (36.2 C) (Oral)   Ht 5\' 5"  (1.651 m)   Wt 87 lb 12.8 oz (39.8 kg)   SpO2 100% Comment: 2 liter Oxygen  BMI 14.61 kg/m    Subjective:    Patient ID: Brittany Hensley, female    DOB: Mar 03, 1951, 67 y.o.   MRN: [id number]  HPI: Brittany Hensley is a 67 y.o. female presenting on 06/27/2018 for No chief complaint on file.  This patient comes in for follow-up on her recent pneumonia.  She was seen in the hospital but then had to be followed up in our office from an urgent care today.  She has completed her antibiotic of Levaquin.  She is almost finished with her prednisone.  She has been able to continue with her nebulizer and inhalers.  She has had a great improvement in her condition.  We have reviewed her CT and is found that she will need to have February 2020 show full resolution of her chest.  She is a very good oxygen today and states that she is feeling very good.  Past Medical History:  Diagnosis Date  . Anemia    iron   . Anxiety   . Diverticulitis    Hx: of  . GERD (gastroesophageal reflux disease)    at times  . History of kidney stones    10 years ago  . Hypothyroidism   . Low iron   . Pneumonia   . Rosacea    Hx: of   Relevant past medical, surgical, family and social history reviewed and updated as indicated. Interim medical history since our last visit reviewed. Allergies and medications reviewed and updated. DATA REVIEWED: CHART IN EPIC  Family History reviewed for pertinent findings.  Review of Systems  Constitutional: Positive for fatigue.  HENT: Negative.   Eyes: Negative.   Respiratory: Negative.  Negative for cough, shortness of breath and wheezing.   Cardiovascular: Negative.  Negative for chest pain, palpitations and leg swelling.  Gastrointestinal: Negative.   Genitourinary: Negative.   Neurological: Positive for weakness.    Allergies as of 06/27/2018      Reactions   Augmentin [amoxicillin-pot  Clavulanate] Other (See Comments)   Bloody stool Has patient had a PCN reaction causing immediate rash, facial/tongue/throat swelling, SOB or lightheadedness with hypotension: No Has patient had a PCN reaction causing severe rash involving mucus membranes or skin necrosis: No Has patient had a PCN reaction that required hospitalization: No Has patient had a PCN reaction occurring within the last 10 years: Yes If all of the above answers are "NO", then may proceed with Cephalosporin use.   Famotidine Other (See Comments)   Fever   Klonopin [clonazepam] Other (See Comments)   REACTION: double vision   Reglan [metoclopramide]    Causes confusion, anxiety   Nitrofurantoin Rash   Sulfamethoxazole-trimethoprim Rash      Medication List        Accurate as of 06/27/18 10:16 PM. Always use your most recent med list.          acetaminophen 500 MG tablet Commonly known as:  TYLENOL Take 500 mg by mouth 2 (two) times daily.   albuterol 108 (90 Base) MCG/ACT inhaler Commonly known as:  PROVENTIL HFA;VENTOLIN HFA Inhale 2 puffs into the lungs every 6 (six) hours as needed for wheezing or shortness of breath.   ALPRAZolam 0.5 MG tablet Commonly known as:  XANAX Take 1  tablet (0.5 mg total) by mouth 4 (four) times daily as needed for anxiety.   aspirin 325 MG EC tablet Take 1 tablet (325 mg total) by mouth 2 (two) times daily.   CALCIUM 600 PO Take 600 mg by mouth 2 (two) times daily.   CENTRUM SILVER Chew Chew 1 tablet by mouth 2 (two) times daily.   docusate sodium 100 MG capsule Commonly known as:  COLACE Take 1 capsule (100 mg total) by mouth 2 (two) times daily.   Flaxseed Oil 1200 MG Caps Take 1,200 mg by mouth daily with lunch.   fluticasone furoate-vilanterol 200-25 MCG/INH Aepb Commonly known as:  BREO ELLIPTA Inhale 1 puff into the lungs 1 day or 1 dose for 1 dose.   guaiFENesin 600 MG 12 hr tablet Commonly known as:  MUCINEX Take 1 tablet (600 mg total) by mouth  2 (two) times daily.   levofloxacin 500 MG tablet Commonly known as:  LEVAQUIN Take 1 tablet (500 mg total) by mouth daily for 10 days.   levothyroxine 100 MCG tablet Commonly known as:  SYNTHROID, LEVOTHROID Take 1 tablet (100 mcg total) by mouth every morning.   loratadine 10 MG tablet Commonly known as:  CLARITIN Take 10 mg by mouth 2 (two) times daily.   omeprazole 40 MG capsule Commonly known as:  PRILOSEC Take 1 capsule (40 mg total) by mouth daily.   OXYGEN Inhale 2 L into the lungs.   predniSONE 10 MG tablet Commonly known as:  DELTASONE Take 1 tablet (10 mg total) by mouth daily with breakfast.   PROBIOTIC PO Take 1 capsule by mouth daily with lunch.   sertraline 100 MG tablet Commonly known as:  ZOLOFT Take 1 tablet (100 mg total) by mouth at bedtime.   sucralfate 1 g tablet Commonly known as:  CARAFATE Take 1 tablet (1 g total) by mouth 3 (three) times daily with meals.   Vitamin C 500 MG Chew Chew 500 mg by mouth 2 (two) times daily.   Vitamin D 50 MCG (2000 UT) tablet Take 2,000 Units by mouth 2 (two) times daily.          Objective:    BP (!) 125/59   Pulse 92   Temp (!) 97.2 F (36.2 C) (Oral)   Ht 5\' 5"  (1.651 m)   Wt 87 lb 12.8 oz (39.8 kg)   SpO2 100% Comment: 2 liter Oxygen  BMI 14.61 kg/m   Allergies  Allergen Reactions  . Augmentin [Amoxicillin-Pot Clavulanate] Other (See Comments)    Bloody stool Has patient had a PCN reaction causing immediate rash, facial/tongue/throat swelling, SOB or lightheadedness with hypotension: No Has patient had a PCN reaction causing severe rash involving mucus membranes or skin necrosis: No Has patient had a PCN reaction that required hospitalization: No Has patient had a PCN reaction occurring within the last 10 years: Yes If all of the above answers are "NO", then may proceed with Cephalosporin use.   . Famotidine Other (See Comments)    Fever   . Klonopin [Clonazepam] Other (See Comments)     REACTION: double vision  . Reglan [Metoclopramide]     Causes confusion, anxiety  . Nitrofurantoin Rash  . Sulfamethoxazole-Trimethoprim Rash    Wt Readings from Last 3 Encounters:  06/27/18 87 lb 12.8 oz (39.8 kg)  06/17/18 80 lb (36.3 kg)  06/15/18 84 lb 3.5 oz (38.2 kg)    Physical Exam  Constitutional: She is oriented to person, place, and time. She appears  well-developed and well-nourished.  HENT:  Head: Normocephalic and atraumatic.  Right Ear: Tympanic membrane, external ear and ear canal normal.  Left Ear: Tympanic membrane, external ear and ear canal normal.  Nose: Nose normal. No rhinorrhea.  Mouth/Throat: Oropharynx is clear and moist and mucous membranes are normal. No oropharyngeal exudate or posterior oropharyngeal erythema.  Eyes: Pupils are equal, round, and reactive to light. Conjunctivae and EOM are normal.  Neck: Normal range of motion. Neck supple.  Cardiovascular: Normal rate, regular rhythm, normal heart sounds and intact distal pulses.  Pulmonary/Chest: Effort normal and breath sounds normal.  Abdominal: Soft. Bowel sounds are normal.  Neurological: She is alert and oriented to person, place, and time. She has normal reflexes.  Skin: Skin is warm and dry. No rash noted.  Psychiatric: She has a normal mood and affect. Her behavior is normal. Judgment and thought content normal.    Results for orders placed or performed during the hospital encounter of 06/12/18  Culture, blood (Routine x 2)  Result Value Ref Range   Specimen Description LEFT ANTECUBITAL    Special Requests      BOTTLES DRAWN AEROBIC AND ANAEROBIC Blood Culture adequate volume   Culture      NO GROWTH 5 DAYS Performed at [facility], [address], [place], [place] [address]    Report Status 06/17/2018 FINAL   Culture, blood (Routine x 2)  Result Value Ref Range   Specimen Description BLOOD RIGHT WRIST    Special Requests      BOTTLES DRAWN AEROBIC ONLY Blood Culture adequate volume     Culture      NO GROWTH 5 DAYS Performed at [facility], [address] [place], [place] [address]    Report Status 06/17/2018 FINAL   Urine culture  Result Value Ref Range   Specimen Description      URINE, CATHETERIZED Performed at [facility], [address], [place], [place] [address]    Special Requests      NONE Performed at [facility], [address], [place], [place] [address]    Culture      NO GROWTH Performed at [facility], [place] [address], [place],  [address]    Report Status 06/13/2018 FINAL   Respiratory Panel by PCR  Result Value Ref Range   Adenovirus NOT DETECTED NOT DETECTED   Coronavirus 229E NOT DETECTED NOT DETECTED   Coronavirus HKU1 NOT DETECTED NOT DETECTED   Coronavirus NL63 NOT DETECTED NOT DETECTED   Coronavirus OC43 NOT DETECTED NOT DETECTED   Metapneumovirus NOT DETECTED NOT DETECTED   Rhinovirus / Enterovirus NOT DETECTED NOT DETECTED   Influenza A NOT DETECTED NOT DETECTED   Influenza B NOT DETECTED NOT DETECTED   Parainfluenza Virus 1 NOT DETECTED NOT DETECTED   Parainfluenza Virus 2 NOT DETECTED NOT DETECTED   Parainfluenza Virus 3 NOT DETECTED NOT DETECTED   Parainfluenza Virus 4 NOT DETECTED NOT DETECTED   Respiratory Syncytial Virus NOT DETECTED NOT DETECTED   Bordetella pertussis NOT DETECTED NOT DETECTED   Chlamydophila pneumoniae NOT DETECTED NOT DETECTED   Mycoplasma pneumoniae NOT DETECTED NOT DETECTED  MRSA PCR Screening  Result Value Ref Range   MRSA by PCR POSITIVE (A) NEGATIVE  Urinalysis, Routine w reflex microscopic  Result Value Ref Range   Color, Urine YELLOW YELLOW   APPearance HAZY (A) CLEAR   Specific Gravity, Urine 1.015 1.005 - 1.030   pH 6.0 5.0 - 8.0   Glucose, UA NEGATIVE NEGATIVE mg/dL  Hgb urine dipstick LARGE (A) NEGATIVE   Bilirubin Urine NEGATIVE NEGATIVE   Ketones, ur NEGATIVE NEGATIVE mg/dL   Protein, ur 100 (A) NEGATIVE mg/dL   Nitrite NEGATIVE NEGATIVE   Leukocytes, UA NEGATIVE  NEGATIVE   RBC / HPF >50 (H) 0 - 5 RBC/hpf   WBC, UA 21-50 0 - 5 WBC/hpf   Bacteria, UA NONE SEEN NONE SEEN   Squamous Epithelial / LPF 0-5 0 - 5  Protime-INR  Result Value Ref Range   Prothrombin Time 14.8 11.4 - 15.2 seconds   INR 1.17   Influenza panel by PCR (type A & B)  Result Value Ref Range   Influenza A By PCR NEGATIVE NEGATIVE   Influenza B By PCR NEGATIVE NEGATIVE  Comprehensive metabolic panel  Result Value Ref Range   Sodium 143 135 - 145 mmol/L   Potassium 3.9 3.5 - 5.1 mmol/L   Chloride 115 (H) 98 - 111 mmol/L   CO2 20 (L) 22 - 32 mmol/L   Glucose, Bld 121 (H) 70 - 99 mg/dL   BUN 22 8 - 23 mg/dL   Creatinine, Ser 0.81 0.44 - 1.00 mg/dL   Calcium 8.7 (L) 8.9 - 10.3 mg/dL   Total Protein 6.6 6.5 - 8.1 g/dL   Albumin 3.6 3.5 - 5.0 g/dL   AST 50 (H) 15 - 41 U/L   ALT 42 0 - 44 U/L   Alkaline Phosphatase 108 38 - 126 U/L   Total Bilirubin 1.0 0.3 - 1.2 mg/dL   GFR calc non Af Amer >60 >60 mL/min   GFR calc Af Amer >60 >60 mL/min   Anion gap 8 5 - 15  Brain natriuretic peptide  Result Value Ref Range   B Natriuretic Peptide 579.0 (H) 0.0 - 100.0 pg/mL  Troponin I Once  Result Value Ref Range   Troponin I 0.03 (HH) <0.03 ng/mL  CBC with Differential  Result Value Ref Range   WBC 9.4 4.0 - 10.5 K/uL   RBC 4.31 3.87 - 5.11 MIL/uL   Hemoglobin 13.2 12.0 - 15.0 g/dL   HCT 44.8 36.0 - 46.0 %   MCV 103.9 (H) 80.0 - 100.0 fL   MCH 30.6 26.0 - 34.0 pg   MCHC 29.5 (L) 30.0 - 36.0 g/dL   RDW 15.1 11.5 - 15.5 %   Platelets 273 150 - 400 K/uL   nRBC 0.0 0.0 - 0.2 %   Neutrophils Relative % 83 %   Neutro Abs 7.7 1.7 - 7.7 K/uL   Lymphocytes Relative 3 %   Lymphs Abs 0.3 (L) 0.7 - 4.0 K/uL   Monocytes Relative 11 %   Monocytes Absolute 1.1 (H) 0.1 - 1.0 K/uL   Eosinophils Relative 1 %   Eosinophils Absolute 0.1 0.0 - 0.5 K/uL   Basophils Relative 1 %   Basophils Absolute 0.1 0.0 - 0.1 K/uL   WBC Morphology      MODERATE LEFT SHIFT (>5% METAS AND MYELOS,OCC PRO  NOTED)   Immature Granulocytes 1 %   Abs Immature Granulocytes 0.07 0.00 - 0.07 K/uL  Basic metabolic panel  Result Value Ref Range   Sodium 146 (H) 135 - 145 mmol/L   Potassium 4.0 3.5 - 5.1 mmol/L   Chloride 116 (H) 98 - 111 mmol/L   CO2 21 (L) 22 - 32 mmol/L   Glucose, Bld 114 (H) 70 - 99 mg/dL   BUN 31 (H) 8 - 23 mg/dL   Creatinine, Ser 1.01 (H) 0.44 - 1.00 mg/dL  Calcium 8.5 (L) 8.9 - 10.3 mg/dL   GFR calc non Af Amer 56 (L) >60 mL/min   GFR calc Af Amer >60 >60 mL/min   Anion gap 9 5 - 15  Procalcitonin - Baseline  Result Value Ref Range   Procalcitonin 6.47 ng/mL  Troponin I Now Then Q6H  Result Value Ref Range   Troponin I 0.03 (HH) <0.03 ng/mL  Troponin I Now Then Q6H  Result Value Ref Range   Troponin I 0.03 (HH) <0.03 ng/mL  CBC  Result Value Ref Range   WBC 8.8 4.0 - 10.5 K/uL   RBC 3.59 (L) 3.87 - 5.11 MIL/uL   Hemoglobin 11.0 (L) 12.0 - 15.0 g/dL   HCT 35.9 (L) 36.0 - 46.0 %   MCV 100.0 80.0 - 100.0 fL   MCH 30.6 26.0 - 34.0 pg   MCHC 30.6 30.0 - 36.0 g/dL   RDW 15.1 11.5 - 15.5 %   Platelets 226 150 - 400 K/uL   nRBC 0.0 0.0 - 0.2 %  Basic metabolic panel  Result Value Ref Range   Sodium 144 135 - 145 mmol/L   Potassium 3.4 (L) 3.5 - 5.1 mmol/L   Chloride 115 (H) 98 - 111 mmol/L   CO2 22 22 - 32 mmol/L   Glucose, Bld 107 (H) 70 - 99 mg/dL   BUN 30 (H) 8 - 23 mg/dL   Creatinine, Ser 0.99 0.44 - 1.00 mg/dL   Calcium 8.2 (L) 8.9 - 10.3 mg/dL   GFR calc non Af Amer 57 (L) >60 mL/min   GFR calc Af Amer >60 >60 mL/min   Anion gap 7 5 - 15  Magnesium  Result Value Ref Range   Magnesium 2.0 1.7 - 2.4 mg/dL  Brain natriuretic peptide  Result Value Ref Range   B Natriuretic Peptide 125.0 (H) 0.0 - 100.0 pg/mL  Procalcitonin - Baseline  Result Value Ref Range   Procalcitonin 3.31 ng/mL  Basic metabolic panel  Result Value Ref Range   Sodium 144 135 - 145 mmol/L   Potassium 3.3 (L) 3.5 - 5.1 mmol/L   Chloride 113 (H) 98 - 111 mmol/L   CO2 24 22 - 32  mmol/L   Glucose, Bld 109 (H) 70 - 99 mg/dL   BUN 25 (H) 8 - 23 mg/dL   Creatinine, Ser 0.92 0.44 - 1.00 mg/dL   Calcium 8.5 (L) 8.9 - 10.3 mg/dL   GFR calc non Af Amer >60 >60 mL/min   GFR calc Af Amer >60 >60 mL/min   Anion gap 7 5 - 15  CBC  Result Value Ref Range   WBC 8.0 4.0 - 10.5 K/uL   RBC 3.84 (L) 3.87 - 5.11 MIL/uL   Hemoglobin 11.8 (L) 12.0 - 15.0 g/dL   HCT 39.5 36.0 - 46.0 %   MCV 102.9 (H) 80.0 - 100.0 fL   MCH 30.7 26.0 - 34.0 pg   MCHC 29.9 (L) 30.0 - 36.0 g/dL   RDW 15.2 11.5 - 15.5 %   Platelets 192 150 - 400 K/uL   nRBC 0.0 0.0 - 0.2 %  Magnesium  Result Value Ref Range   Magnesium 1.9 1.7 - 2.4 mg/dL  CBG monitoring, ED  Result Value Ref Range   Glucose-Capillary 119 (H) 70 - 99 mg/dL  I-Stat CG4 Lactic Acid, ED  Result Value Ref Range   Lactic Acid, Venous 1.88 0.5 - 1.9 mmol/L  I-Stat CG4 Lactic Acid, ED  Result Value Ref Range  Lactic Acid, Venous 3.65 (HH) 0.5 - 1.9 mmol/L   Comment NOTIFIED PHYSICIAN   ECHOCARDIOGRAM COMPLETE  Result Value Ref Range   Weight 1,400.36 oz   Height 65 in   BP 137/119 mmHg      Assessment & Plan:   1. GAD (generalized anxiety disorder) - ALPRAZolam (XANAX) 0.5 MG tablet; Take 1 tablet (0.5 mg total) by mouth 4 (four) times daily as needed for anxiety.  Dispense: 120 tablet; Refill: 5  2. Anemia, unspecified - sucralfate (CARAFATE) 1 g tablet; Take 1 tablet (1 g total) by mouth 3 (three) times daily with meals.  Dispense: 90 tablet; Refill: 2  3. Lobar pneumonia ([place]) Resolved, DUE CT without contract Feb 2020 for complted resolution  4. Acute respiratory failure with hypoxia (HCC) Continue medications   Continue all other maintenance medications as listed above.  Follow up plan: Return in about 4 weeks (around 07/25/2018).  Educational handout given for [place] PA-C [place] [address]  [place],  [address] [phone]   06/27/2018, 10:16  PM

## 2018-06-29 DIAGNOSIS — I5031 Acute diastolic (congestive) heart failure: Secondary | ICD-10-CM | POA: Diagnosis not present

## 2018-06-29 DIAGNOSIS — I509 Heart failure, unspecified: Secondary | ICD-10-CM | POA: Diagnosis not present

## 2018-06-29 DIAGNOSIS — J9601 Acute respiratory failure with hypoxia: Secondary | ICD-10-CM | POA: Diagnosis not present

## 2018-06-29 DIAGNOSIS — J181 Lobar pneumonia, unspecified organism: Secondary | ICD-10-CM | POA: Diagnosis not present

## 2018-06-29 DIAGNOSIS — J9600 Acute respiratory failure, unspecified whether with hypoxia or hypercapnia: Secondary | ICD-10-CM | POA: Diagnosis not present

## 2018-06-29 DIAGNOSIS — J449 Chronic obstructive pulmonary disease, unspecified: Secondary | ICD-10-CM | POA: Diagnosis not present

## 2018-07-01 ENCOUNTER — Telehealth: Payer: Self-pay | Admitting: Physician Assistant

## 2018-07-01 NOTE — Telephone Encounter (Signed)
Patient states a nebulizer machine was just delivered to her home but she does not have the medication for it.  Did not see order for nebulizer in last office note.  Will check with Particia Nearing, PA regarding nebulizer and medication.  Patient states she does have her albuterol inhaler.  Advised her to continue to use inhaler as needed.

## 2018-07-03 ENCOUNTER — Other Ambulatory Visit: Payer: Self-pay | Admitting: Physician Assistant

## 2018-07-03 MED ORDER — ALBUTEROL SULFATE 1.25 MG/3ML IN NEBU: 1.0000 | INHALATION_SOLUTION | Freq: Four times a day (QID) | RESPIRATORY_TRACT | 12 refills | Status: DC | PRN

## 2018-07-03 NOTE — Progress Notes (Signed)
albutero

## 2018-07-03 NOTE — Telephone Encounter (Signed)
Sent albuterol to the pharmacy

## 2018-07-04 ENCOUNTER — Ambulatory Visit: Payer: Self-pay | Admitting: *Deleted

## 2018-07-04 DIAGNOSIS — E43 Unspecified severe protein-calorie malnutrition: Secondary | ICD-10-CM

## 2018-07-04 DIAGNOSIS — J9601 Acute respiratory failure with hypoxia: Secondary | ICD-10-CM

## 2018-07-04 DIAGNOSIS — Z681 Body mass index (BMI) 19 or less, adult: Secondary | ICD-10-CM

## 2018-07-04 DIAGNOSIS — J189 Pneumonia, unspecified organism: Secondary | ICD-10-CM

## 2018-07-04 DIAGNOSIS — S72001G Fracture of unspecified part of neck of right femur, subsequent encounter for closed fracture with delayed healing: Secondary | ICD-10-CM

## 2018-07-04 NOTE — Telephone Encounter (Signed)
Pt aware.

## 2018-07-04 NOTE — Chronic Care Management (AMB) (Signed)
  Chronic Care Management   Note  07/04/2018 Name: Brittany Hensley MRN: [id number] DOB: [birth year]  Telephone outreach to [name] today in response to referral from her health plan for case management services.   [name] was given information about Chronic Care Management services today including:  1. CCM service includes personalized support from designated clinical staff supervised by her physician, including individualized plan of care and coordination with other care providers 2. 24/7 contact phone numbers for assistance for urgent and routine care needs. 3. Service will only be billed when office clinical staff spend 20 minutes or more in a month to coordinate care. 4. Only one practitioner may furnish and bill the service in a calendar month. 5. The patient may stop CCM services at any time (effective at the end of the month) by phone call to the office staff. 6. The patient will be responsible for cost sharing (co-pay) of up to 20% of the service fee (after annual deductible is met).  Patient expressed interest in services but would like to hold on enrollment until first of the year. Citing her desire to work on GI concerns until then. Explained to patient that case management services could assist with care coordination around GI care.   Patient agreed to follow up phone call by case management team.   Plan Telephone outreach within the next 30 days.   Chong Sicilian, RN-BC, BSN [facility] Management Team

## 2018-07-04 NOTE — Patient Instructions (Signed)
Ms. Kataoka was given information about Chronic Care Management services today including:  1. CCM service includes personalized support from designated clinical staff supervised by her physician, including individualized plan of care and coordination with other care providers 2. 24/7 contact phone numbers for assistance for urgent and routine care needs. 3. Service will only be billed when office clinical staff spend 20 minutes or more in a month to coordinate care. 4. Only one practitioner may furnish and bill the service in a calendar month. 5. The patient may stop CCM services at any time (effective at the end of the month) by phone call to the office staff. 6. The patient will be responsible for cost sharing (co-pay) of up to 20% of the service fee (after annual deductible is met).  I will return a call to you regarding your enrollment in CM services.

## 2018-07-06 ENCOUNTER — Other Ambulatory Visit: Payer: Self-pay | Admitting: Gastroenterology

## 2018-07-07 ENCOUNTER — Telehealth: Payer: Self-pay | Admitting: Nurse Practitioner

## 2018-07-07 NOTE — Telephone Encounter (Signed)
Pt aware and voiced understanding 

## 2018-07-07 NOTE — Telephone Encounter (Signed)
Pt has been on  prednisone 10 mg for 17 days it is making her feel nervous and anxious feeling and agitated.   Was told she could cut it back wants to know how she needs to do that, she is feeling better with her pneumonia

## 2018-07-07 NOTE — Telephone Encounter (Signed)
Take 1/2 tab 2 days then off

## 2018-07-07 NOTE — Telephone Encounter (Signed)
How should pt titrate the prednisone.

## 2018-07-11 ENCOUNTER — Telehealth: Payer: Self-pay | Admitting: Nurse Practitioner

## 2018-07-11 NOTE — Telephone Encounter (Signed)
Spoke with pt and she says she only uses the [place] 02 at night but has surgery next week and wants to know if she should take the oxygen with her in the car "just in case" or would she be ok to not take the tank. Also she wanted to verify you taking over her care and becoming her new PCP. Please advise.

## 2018-07-12 NOTE — Telephone Encounter (Signed)
Patient aware of recommendation.  

## 2018-07-12 NOTE — Telephone Encounter (Signed)
I would go ahead and take it with her in the car so that way her oxygen is as good as it can be before her surgery.

## 2018-07-18 ENCOUNTER — Other Ambulatory Visit: Payer: Self-pay | Admitting: Gastroenterology

## 2018-07-19 ENCOUNTER — Encounter (HOSPITAL_COMMUNITY): Payer: Self-pay | Admitting: Gastroenterology

## 2018-07-19 ENCOUNTER — Ambulatory Visit (HOSPITAL_COMMUNITY): Payer: PPO | Admitting: Certified Registered Nurse Anesthetist

## 2018-07-19 ENCOUNTER — Other Ambulatory Visit: Payer: Self-pay

## 2018-07-19 ENCOUNTER — Encounter (HOSPITAL_COMMUNITY)
Admission: RE | Disposition: A | Discharge status: Home / Self Care | Payer: Self-pay | Source: Home / Self Care | Attending: Gastroenterology

## 2018-07-19 ENCOUNTER — Ambulatory Visit (HOSPITAL_COMMUNITY)
Admission: RE | Admit: 2018-07-19 | Discharge: 2018-07-19 | Disposition: A | Discharge status: Home / Self Care | Payer: PPO | Attending: Gastroenterology | Admitting: Gastroenterology

## 2018-07-19 DIAGNOSIS — I509 Heart failure, unspecified: Secondary | ICD-10-CM | POA: Diagnosis not present

## 2018-07-19 DIAGNOSIS — T182XXA Foreign body in stomach, initial encounter: Secondary | ICD-10-CM | POA: Diagnosis not present

## 2018-07-19 DIAGNOSIS — Z980 Intestinal bypass and anastomosis status: Secondary | ICD-10-CM | POA: Diagnosis not present

## 2018-07-19 DIAGNOSIS — R6881 Early satiety: Secondary | ICD-10-CM | POA: Insufficient documentation

## 2018-07-19 DIAGNOSIS — Z7982 Long term (current) use of aspirin: Secondary | ICD-10-CM | POA: Insufficient documentation

## 2018-07-19 DIAGNOSIS — F419 Anxiety disorder, unspecified: Secondary | ICD-10-CM | POA: Insufficient documentation

## 2018-07-19 DIAGNOSIS — Z79899 Other long term (current) drug therapy: Secondary | ICD-10-CM | POA: Insufficient documentation

## 2018-07-19 DIAGNOSIS — K311 Adult hypertrophic pyloric stenosis: Secondary | ICD-10-CM | POA: Diagnosis not present

## 2018-07-19 DIAGNOSIS — R112 Nausea with vomiting, unspecified: Secondary | ICD-10-CM | POA: Insufficient documentation

## 2018-07-19 DIAGNOSIS — K229 Disease of esophagus, unspecified: Secondary | ICD-10-CM | POA: Diagnosis not present

## 2018-07-19 DIAGNOSIS — K219 Gastro-esophageal reflux disease without esophagitis: Secondary | ICD-10-CM | POA: Insufficient documentation

## 2018-07-19 DIAGNOSIS — X58XXXA Exposure to other specified factors, initial encounter: Secondary | ICD-10-CM | POA: Insufficient documentation

## 2018-07-19 DIAGNOSIS — Z79890 Hormone replacement therapy (postmenopausal): Secondary | ICD-10-CM | POA: Diagnosis not present

## 2018-07-19 DIAGNOSIS — K449 Diaphragmatic hernia without obstruction or gangrene: Secondary | ICD-10-CM | POA: Diagnosis not present

## 2018-07-19 DIAGNOSIS — E039 Hypothyroidism, unspecified: Secondary | ICD-10-CM | POA: Insufficient documentation

## 2018-07-19 HISTORY — PX: FOREIGN BODY REMOVAL: SHX962

## 2018-07-19 HISTORY — PX: ESOPHAGOGASTRODUODENOSCOPY: SHX5428

## 2018-07-19 HISTORY — PX: BALLOON DILATION: SHX5330

## 2018-07-19 SURGERY — EGD (ESOPHAGOGASTRODUODENOSCOPY)
Anesthesia: Monitor Anesthesia Care

## 2018-07-19 MED ORDER — LACTATED RINGERS IV SOLN
INTRAVENOUS | Status: DC
  Administered 2018-07-19: 13:00:00 via INTRAVENOUS

## 2018-07-19 MED ORDER — ONDANSETRON HCL 4 MG/2ML IJ SOLN
INTRAMUSCULAR | Status: DC | PRN
  Administered 2018-07-19: 4 mg via INTRAVENOUS

## 2018-07-19 MED ORDER — PROPOFOL 10 MG/ML IV BOLUS
INTRAVENOUS | Status: DC | PRN
  Administered 2018-07-19: 20 mg via INTRAVENOUS
  Administered 2018-07-19 (×2): 10 mg via INTRAVENOUS

## 2018-07-19 MED ORDER — LIDOCAINE 2% (20 MG/ML) 5 ML SYRINGE
INTRAMUSCULAR | Status: DC | PRN
  Administered 2018-07-19: 60 mg via INTRAVENOUS

## 2018-07-19 MED ORDER — PROPOFOL 500 MG/50ML IV EMUL
INTRAVENOUS | Status: DC | PRN
  Administered 2018-07-19: 150 ug/kg/min via INTRAVENOUS
  Administered 2018-07-19: 125 ug/kg/min via INTRAVENOUS

## 2018-07-19 MED ORDER — SODIUM CHLORIDE 0.9 % IV SOLN: INTRAVENOUS | Status: DC

## 2018-07-19 MED ORDER — PROPOFOL 10 MG/ML IV BOLUS
INTRAVENOUS | Status: AC
  Filled 2018-07-19: qty 40

## 2018-07-19 NOTE — Anesthesia Preprocedure Evaluation (Signed)
Anesthesia Evaluation  Patient identified by MRN, date of birth, ID band Patient awake    Reviewed: Allergy & Precautions, H&P , NPO status , Patient's Chart, lab work & pertinent test results  Airway Mallampati: II   Neck ROM: full    Dental   Pulmonary neg pulmonary ROS,    breath sounds clear to auscultation       Cardiovascular +CHF  + Valvular Problems/Murmurs  Rhythm:regular Rate:Normal     Neuro/Psych PSYCHIATRIC DISORDERS Anxiety    GI/Hepatic GERD  ,  Endo/Other  Hypothyroidism   Renal/GU stones     Musculoskeletal   Abdominal   Peds  Hematology   Anesthesia Other Findings   Reproductive/Obstetrics                             Anesthesia Physical Anesthesia Plan  ASA: III  Anesthesia Plan: MAC   Post-op Pain Management:    Induction: Intravenous  PONV Risk Score and Plan: 2 and Ondansetron, Propofol infusion and Treatment may vary due to age or medical condition  Airway Management Planned: Nasal Cannula  Additional Equipment:   Intra-op Plan:   Post-operative Plan:   Informed Consent: I have reviewed the patients History and Physical, chart, labs and discussed the procedure including the risks, benefits and alternatives for the proposed anesthesia with the patient or authorized representative who has indicated his/her understanding and acceptance.     Plan Discussed with: CRNA, Anesthesiologist and Surgeon  Anesthesia Plan Comments:         Anesthesia Quick Evaluation

## 2018-07-19 NOTE — Op Note (Signed)
[facility] Patient Name: Brittany Hensley Procedure Date: 07/19/2018 MRN: [id number] Attending MD: Clarene Essex , MD Date of Birth: 04-04-1951 CSN: [id number] Age: 67 Admit Type: Outpatient Procedure:                Upper GI endoscopy Indications:              Early satiety, Nausea with vomiting history of                            anastomotic ulcer and stricture Providers:                Clarene Essex, MD, Burtis Junes, RN, Elspeth Cho                            Tech., Technician, [address]. [name] Resides CRNA, CRNA Referring MD:              Medicines:                Propofol total dose 160 mg IV, Ondansetron 4 mg IV Complications:            No immediate complications. Estimated Blood Loss:     Estimated blood loss: none. Procedure:                Pre-Anesthesia Assessment:                           - Prior to the procedure, a History and Physical                            was performed, and patient medications and                            allergies were reviewed. The patient's tolerance of                            previous anesthesia was also reviewed. The risks                            and benefits of the procedure and the sedation                            options and risks were discussed with the patient.                            All questions were answered, and informed consent                            was obtained. Prior Anticoagulants: The patient has                            taken no previous anticoagulant or antiplatelet                            agents. ASA Grade Assessment: III - A patient with  severe systemic disease. After reviewing the risks                            and benefits, the patient was deemed in                            satisfactory condition to undergo the procedure.                           After obtaining informed consent, the endoscope was                            passed under direct vision. Throughout  the                            procedure, the patient's blood pressure, pulse, and                            oxygen saturations were monitored continuously. The                            GIF-H190 ([id number] Olympus adult endoscope was                            introduced through the mouth, and advanced to the                            jejunum. The upper GI endoscopy was somewhat                            difficult due to presence of food. The patient                            tolerated the procedure well. Scope In: Scope Out: Findings:      Patchy, white plaques were found in the upper third of the esophagus.      A small hiatal hernia was present.      A large amount of food (residue) was found on the greater curvature of       the stomach. Removal of some of the food was accomplished using the       rescue net which was the biggest net we had x2.      Evidence of a Roux-en-Y gastrojejunostomy was found. The gastrojejunal       anastomosis was characterized by erythema and mild stenosis. This was       traversed. The duodenum-to-jejunum limb was examined. A TTS dilator was       passed through the scope. Dilation with a 15-16.5-18 mm balloon and an       18-19-20 mm balloon dilator was performed to 19 mm. With very minimal       heme and no obvious complication seen endoscopically      The examined jejunum was normal.      The exam was otherwise without abnormality. Impression:               - Esophageal plaques were found, consistent with  candidiasis.                           - Small hiatal hernia.                           - A large amount of food (residue) in the stomach.                            Removal was successful x2 with still residual                            present.                           - Roux-en-Y gastrojejunostomy with gastrojejunal                            anastomosis characterized by erythema and mild                             stenosis. Dilated.                           - Normal examined jejunum.                           - The examination was otherwise normal. Moderate Sedation:      Not Applicable - Patient had care per Anesthesia. Recommendation:           - Patient has a contact number available for                            emergencies. The signs and symptoms of potential                            delayed complications were discussed with the                            patient. Return to normal activities tomorrow.                            Written discharge instructions were provided to the                            patient.                           - Full liquid diet today.                           - Continue present medications.                           - Return to GI clinic PRN.                           -  Telephone GI clinic if symptomatic PRN. Procedure Code(s):        --- Professional ---                           [phone], Esophagogastroduodenoscopy, flexible,                            transoral; with removal of foreign body(s)                           43245, Esophagogastroduodenoscopy, flexible,                            transoral; with dilation of gastric/duodenal                            stricture(s) (eg, balloon, bougie) Diagnosis Code(s):        --- Professional ---                           K22.9, Disease of esophagus, unspecified                           K44.9, Diaphragmatic hernia without obstruction or                            gangrene                           T18.2XXA, Foreign body in stomach, initial encounter                           Z98.0, Intestinal bypass and anastomosis status                           R68.81, Early satiety                           R11.2, Nausea with vomiting, unspecified CPT copyright 2018 American Medical Association. All rights reserved. The codes documented in this report are preliminary and upon coder review may  be revised to meet current  compliance requirements. Clarene Essex, MD 07/19/2018 1:36:43 PM This report has been signed electronically. Number of Addenda: 0

## 2018-07-19 NOTE — Anesthesia Procedure Notes (Signed)
Procedure Name: MAC Date/Time: 07/19/2018 1:01 PM Performed by: Maxwell Caul, CRNA Pre-anesthesia Checklist: Patient identified, Emergency Drugs available, Suction available and Patient being monitored Oxygen Delivery Method: Nasal cannula

## 2018-07-19 NOTE — Transfer of Care (Signed)
Immediate Anesthesia Transfer of Care Note  Patient: Brittany Hensley  Procedure(s) Performed: ESOPHAGOGASTRODUODENOSCOPY (EGD) (N/A ) BALLOON DILATION (N/A )  Patient Location: PACU  Anesthesia Type:MAC  Level of Consciousness: awake, alert  and oriented  Airway & Oxygen Therapy: Patient Spontanous Breathing and Patient connected to nasal cannula oxygen  Post-op Assessment: Report given to RN and Post -op Vital signs reviewed and stable  Post vital signs: Reviewed and stable  Last Vitals:  Vitals Value Taken Time  BP 96/53 07/19/2018  1:31 PM  Temp 37 C 07/19/2018  1:31 PM  Pulse 84 07/19/2018  1:31 PM  Resp 19 07/19/2018  1:31 PM  SpO2 99 % 07/19/2018  1:31 PM  Vitals shown include unvalidated device data.  Last Pain:  Vitals:   07/19/18 1331  TempSrc: Oral  PainSc: 0-No pain         Complications: No apparent anesthesia complications

## 2018-07-19 NOTE — Progress Notes (Signed)
Brittany Hensley 12:43 PM  Subjective: Patient with no new medical problems since she was discharged from the hospital her hospital computer chart was reviewed and her hospital course reviewed and she still has for early satiety increased reflux and nausea vomiting from her gastric outlet obstruction  Objective: Vital signs stable afebrile no acute distress exam please see preassessment evaluation  Assessment: Anastomotic stricture  Plan: Okay for EGD with balloon dilation with anesthesia assistance  [facility]  Pager [phone] After 5PM or if no answer call [phone]

## 2018-07-19 NOTE — Discharge Instructions (Signed)
Liquids only today may advance diet tomorrow if feeling better remember to eat slowly chew foods well drink plenty of liquids call if question or problem otherwise follow-up in roughly 6 months and will ask Pamala Hurry my nurse to call you in some nystatin swish and swallow for the probable yeast seen in esophagus from probably steroids either oral or in inhalers

## 2018-07-20 ENCOUNTER — Encounter (HOSPITAL_COMMUNITY): Payer: Self-pay | Admitting: Gastroenterology

## 2018-07-20 NOTE — Anesthesia Postprocedure Evaluation (Signed)
Anesthesia Post Note  Patient: Brittany Hensley  Procedure(s) Performed: ESOPHAGOGASTRODUODENOSCOPY (EGD) (N/A ) BALLOON DILATION (N/A )     Patient location during evaluation: Endoscopy Anesthesia Type: MAC Level of consciousness: awake and alert Pain management: pain level controlled Vital Signs Assessment: post-procedure vital signs reviewed and stable Respiratory status: spontaneous breathing, nonlabored ventilation, respiratory function stable and patient connected to nasal cannula oxygen Cardiovascular status: stable and blood pressure returned to baseline Postop Assessment: no apparent nausea or vomiting Anesthetic complications: no    Last Vitals:  Vitals:   07/19/18 1340 07/19/18 1350  BP: 117/65 112/70  Pulse: 86 85  Resp: 19 16  Temp:    SpO2: 98% 97%    Last Pain:  Vitals:   07/19/18 1350  TempSrc:   PainSc: 0-No pain                 Carla Whilden S

## 2018-07-24 DIAGNOSIS — I5031 Acute diastolic (congestive) heart failure: Secondary | ICD-10-CM | POA: Diagnosis not present

## 2018-07-24 DIAGNOSIS — J181 Lobar pneumonia, unspecified organism: Secondary | ICD-10-CM | POA: Diagnosis not present

## 2018-07-24 DIAGNOSIS — I509 Heart failure, unspecified: Secondary | ICD-10-CM | POA: Diagnosis not present

## 2018-07-24 DIAGNOSIS — J9601 Acute respiratory failure with hypoxia: Secondary | ICD-10-CM | POA: Diagnosis not present

## 2018-07-29 DIAGNOSIS — J9601 Acute respiratory failure with hypoxia: Secondary | ICD-10-CM | POA: Diagnosis not present

## 2018-07-29 DIAGNOSIS — J449 Chronic obstructive pulmonary disease, unspecified: Secondary | ICD-10-CM | POA: Diagnosis not present

## 2018-07-29 DIAGNOSIS — I509 Heart failure, unspecified: Secondary | ICD-10-CM | POA: Diagnosis not present

## 2018-07-29 DIAGNOSIS — I5031 Acute diastolic (congestive) heart failure: Secondary | ICD-10-CM | POA: Diagnosis not present

## 2018-07-29 DIAGNOSIS — J181 Lobar pneumonia, unspecified organism: Secondary | ICD-10-CM | POA: Diagnosis not present

## 2018-08-04 ENCOUNTER — Ambulatory Visit (INDEPENDENT_AMBULATORY_CARE_PROVIDER_SITE_OTHER): Payer: PPO

## 2018-08-04 ENCOUNTER — Ambulatory Visit (INDEPENDENT_AMBULATORY_CARE_PROVIDER_SITE_OTHER): Payer: PPO | Admitting: Physician Assistant

## 2018-08-04 ENCOUNTER — Encounter: Payer: Self-pay | Admitting: Physician Assistant

## 2018-08-04 VITALS — BP 124/68 | HR 88 | Temp 98.0°F | Ht 65.0 in | Wt 90.0 lb

## 2018-08-04 DIAGNOSIS — J189 Pneumonia, unspecified organism: Secondary | ICD-10-CM | POA: Diagnosis not present

## 2018-08-04 MED ORDER — AZITHROMYCIN 250 MG PO TABS: 500.0000 mg | ORAL_TABLET | Freq: Once | ORAL | 5 refills | Status: DC

## 2018-08-08 ENCOUNTER — Telehealth: Payer: Self-pay | Admitting: Physician Assistant

## 2018-08-08 ENCOUNTER — Ambulatory Visit: Payer: PPO | Admitting: Nurse Practitioner

## 2018-08-08 ENCOUNTER — Other Ambulatory Visit: Payer: Self-pay | Admitting: Physician Assistant

## 2018-08-08 MED ORDER — AZITHROMYCIN 250 MG PO TABS: 250.0000 mg | ORAL_TABLET | Freq: Once | ORAL | 5 refills | Status: AC

## 2018-08-08 NOTE — Telephone Encounter (Signed)
Message left that rx sent to pharmacy. ?

## 2018-08-08 NOTE — Telephone Encounter (Signed)
Script sent  

## 2018-08-08 NOTE — Progress Notes (Signed)
BP 124/68   Pulse 88   Temp 98 F (36.7 C) (Oral)   Ht 5\' 5"  (1.651 m)   Wt 90 lb (40.8 kg)   SpO2 100%   BMI 14.98 kg/m    Subjective:    Patient ID: Brittany Hensley, female    DOB: 11-Feb-1951, 68 y.o.   MRN: [id number]  HPI: Brittany Hensley is a 68 y.o. female presenting on 08/04/2018 for Pneumonia (1 month follow up )  This patient comes in for 1 month recheck on her pneumonia.  We have also performed a new x-ray today.  She denies any new symptoms.  She states overall she is feeling better.  She is not running any fever or having chills.  She does feel that her shortness of breath and dyspnea are improving.  Past Medical History:  Diagnosis Date  . Anemia    iron   . Anxiety   . Diverticulitis    Hx: of  . GERD (gastroesophageal reflux disease)    at times  . History of kidney stones    10 years ago  . Hypothyroidism   . Low iron   . Pneumonia   . Rosacea    Hx: of   Relevant past medical, surgical, family and social history reviewed and updated as indicated. Interim medical history since our last visit reviewed. Allergies and medications reviewed and updated. DATA REVIEWED: CHART IN EPIC  Family History reviewed for pertinent findings.  Review of Systems  Constitutional: Negative.   HENT: Negative.   Eyes: Negative.   Respiratory: Positive for shortness of breath. Negative for cough and wheezing.   Gastrointestinal: Negative.   Genitourinary: Negative.     Allergies as of 08/04/2018      Reactions   Augmentin [amoxicillin-pot Clavulanate] Other (See Comments)   Bloody stool Has patient had a PCN reaction causing immediate rash, facial/tongue/throat swelling, SOB or lightheadedness with hypotension: No Has patient had a PCN reaction causing severe rash involving mucus membranes or skin necrosis: No Has patient had a PCN reaction that required hospitalization: No Has patient had a PCN reaction occurring within the last 10 years: Yes If all of the above  answers are "NO", then may proceed with Cephalosporin use.   Famotidine Other (See Comments)   Fever   Klonopin [clonazepam] Other (See Comments)   REACTION: double vision   Nitrofurantoin Rash   Reglan [metoclopramide] Anxiety, Other (See Comments)   Causes confusion   Sulfamethoxazole-trimethoprim Rash      Medication List       Accurate as of August 04, 2018 11:59 PM. Always use your most recent med list.        acetaminophen 500 MG tablet Commonly known as:  TYLENOL Take 1,000 mg by mouth 2 (two) times daily.   albuterol (2.5 MG/3ML) 0.083% nebulizer solution Commonly known as:  PROVENTIL Take 2.5 mg by nebulization every 6 (six) hours as needed for wheezing or shortness of breath.   albuterol 108 (90 Base) MCG/ACT inhaler Commonly known as:  PROVENTIL HFA;VENTOLIN HFA Inhale 2 puffs into the lungs every 6 (six) hours as needed for wheezing or shortness of breath.   ALPRAZolam 0.5 MG tablet Commonly known as:  XANAX Take 1 tablet (0.5 mg total) by mouth 4 (four) times daily as needed for anxiety.   aspirin EC 81 MG tablet Take 81 mg by mouth daily.   azithromycin 250 MG tablet Commonly known as:  ZITHROMAX Take 2 tablets (500 mg total) by  mouth once for 1 dose. Take 30-60 minutes before dental procedure   BREO ELLIPTA 200-25 MCG/INH Aepb Generic drug:  fluticasone furoate-vilanterol Inhale 1 puff into the lungs every evening.   CALCIUM 600 PO Take 600 mg by mouth 2 (two) times daily.   CENTRUM SILVER Chew Chew 1 tablet by mouth 2 (two) times daily.   docusate sodium 100 MG capsule Commonly known as:  COLACE Take 1 capsule (100 mg total) by mouth 2 (two) times daily.   Flaxseed Oil 1200 MG Caps Take 1,200 mg by mouth daily with lunch.   levothyroxine 100 MCG tablet Commonly known as:  SYNTHROID, LEVOTHROID Take 1 tablet (100 mcg total) by mouth every morning.   loratadine 10 MG tablet Commonly known as:  CLARITIN Take 20 mg by mouth daily.     nystatin 100000 UNIT/ML suspension Commonly known as:  MYCOSTATIN Take 5 mLs by mouth 4 (four) times daily.   omeprazole 40 MG capsule Commonly known as:  PRILOSEC Take 1 capsule (40 mg total) by mouth daily.   OXYGEN Inhale 2 L into the lungs as needed (for shortness of breath).   PROBIOTIC PO Take 1 capsule by mouth daily with lunch.   sertraline 100 MG tablet Commonly known as:  ZOLOFT Take 1 tablet (100 mg total) by mouth at bedtime.   sucralfate 1 g tablet Commonly known as:  CARAFATE Take 1 tablet (1 g total) by mouth 3 (three) times daily with meals.   Vitamin C 500 MG Chew Chew 500 mg by mouth 2 (two) times daily.   Vitamin D 50 MCG (2000 UT) tablet Take 2,000 Units by mouth 2 (two) times daily.          Objective:    BP 124/68   Pulse 88   Temp 98 F (36.7 C) (Oral)   Ht 5\' 5"  (1.651 m)   Wt 90 lb (40.8 kg)   SpO2 100%   BMI 14.98 kg/m   Allergies  Allergen Reactions  . Augmentin [Amoxicillin-Pot Clavulanate] Other (See Comments)    Bloody stool Has patient had a PCN reaction causing immediate rash, facial/tongue/throat swelling, SOB or lightheadedness with hypotension: No Has patient had a PCN reaction causing severe rash involving mucus membranes or skin necrosis: No Has patient had a PCN reaction that required hospitalization: No Has patient had a PCN reaction occurring within the last 10 years: Yes If all of the above answers are "NO", then may proceed with Cephalosporin use.   . Famotidine Other (See Comments)    Fever   . Klonopin [Clonazepam] Other (See Comments)    REACTION: double vision  . Nitrofurantoin Rash  . Reglan [Metoclopramide] Anxiety and Other (See Comments)    Causes confusion  . Sulfamethoxazole-Trimethoprim Rash    Wt Readings from Last 3 Encounters:  08/04/18 90 lb (40.8 kg)  07/19/18 90 lb (40.8 kg)  06/27/18 87 lb 12.8 oz (39.8 kg)    Physical Exam Constitutional:      Appearance: She is well-developed.   HENT:     Head: Normocephalic and atraumatic.  Eyes:     Conjunctiva/sclera: Conjunctivae normal.     Pupils: Pupils are equal, round, and reactive to light.  Cardiovascular:     Rate and Rhythm: Normal rate and regular rhythm.     Heart sounds: Normal heart sounds.  Pulmonary:     Effort: Pulmonary effort is normal. No respiratory distress.     Breath sounds: Normal breath sounds. No wheezing or rales.  Abdominal:     General: Bowel sounds are normal.     Palpations: Abdomen is soft.  Skin:    General: Skin is warm and dry.     Findings: No rash.  Neurological:     Mental Status: She is alert and oriented to person, place, and time.     Deep Tendon Reflexes: Reflexes are normal and symmetric.  Psychiatric:        Behavior: Behavior normal.        Thought Content: Thought content normal.        Judgment: Judgment normal.         Assessment & Plan:   1. Community acquired pneumonia, unspecified laterality Follow with regular routine visits. - DG Chest 2 View; Future Near clearing of pneumonia Some atelectasis remaining in the right middle lobe Chronic emphysematous changes seen in the lungs  Continue all other maintenance medications as listed above.  Follow up plan: Return in about 3 months (around 11/03/2018) for recheck.  Educational handout given for [place] PA-C [place] [address]  [place], [place] [address] [phone]   08/08/2018, 8:30 AM

## 2018-08-24 DIAGNOSIS — J9600 Acute respiratory failure, unspecified whether with hypoxia or hypercapnia: Secondary | ICD-10-CM | POA: Diagnosis not present

## 2018-08-24 DIAGNOSIS — J181 Lobar pneumonia, unspecified organism: Secondary | ICD-10-CM | POA: Diagnosis not present

## 2018-08-24 DIAGNOSIS — J9601 Acute respiratory failure with hypoxia: Secondary | ICD-10-CM | POA: Diagnosis not present

## 2018-08-24 DIAGNOSIS — I5031 Acute diastolic (congestive) heart failure: Secondary | ICD-10-CM | POA: Diagnosis not present

## 2018-08-24 DIAGNOSIS — I509 Heart failure, unspecified: Secondary | ICD-10-CM | POA: Diagnosis not present

## 2018-08-29 DIAGNOSIS — J9601 Acute respiratory failure with hypoxia: Secondary | ICD-10-CM | POA: Diagnosis not present

## 2018-08-29 DIAGNOSIS — J449 Chronic obstructive pulmonary disease, unspecified: Secondary | ICD-10-CM | POA: Diagnosis not present

## 2018-08-29 DIAGNOSIS — J181 Lobar pneumonia, unspecified organism: Secondary | ICD-10-CM | POA: Diagnosis not present

## 2018-08-29 DIAGNOSIS — I509 Heart failure, unspecified: Secondary | ICD-10-CM | POA: Diagnosis not present

## 2018-08-29 DIAGNOSIS — I5031 Acute diastolic (congestive) heart failure: Secondary | ICD-10-CM | POA: Diagnosis not present

## 2018-09-02 ENCOUNTER — Telehealth: Payer: Self-pay | Admitting: Oncology

## 2018-09-02 NOTE — Telephone Encounter (Signed)
Patient called to reschedule  °

## 2018-09-17 ENCOUNTER — Other Ambulatory Visit: Payer: Self-pay | Admitting: Physician Assistant

## 2018-09-21 ENCOUNTER — Inpatient Hospital Stay: Payer: PPO | Attending: Oncology

## 2018-09-21 DIAGNOSIS — Z96642 Presence of left artificial hip joint: Secondary | ICD-10-CM | POA: Insufficient documentation

## 2018-09-21 DIAGNOSIS — D500 Iron deficiency anemia secondary to blood loss (chronic): Secondary | ICD-10-CM | POA: Insufficient documentation

## 2018-09-21 DIAGNOSIS — D508 Other iron deficiency anemias: Secondary | ICD-10-CM

## 2018-09-21 DIAGNOSIS — K922 Gastrointestinal hemorrhage, unspecified: Secondary | ICD-10-CM | POA: Insufficient documentation

## 2018-09-21 DIAGNOSIS — R4702 Dysphasia: Secondary | ICD-10-CM | POA: Insufficient documentation

## 2018-09-21 LAB — CBC WITH DIFFERENTIAL (CANCER CENTER ONLY)
ABS IMMATURE GRANULOCYTES: 0.02 10*3/uL (ref 0.00–0.07)
Basophils Absolute: 0 10*3/uL (ref 0.0–0.1)
Basophils Relative: 1 %
Eosinophils Absolute: 0.2 10*3/uL (ref 0.0–0.5)
Eosinophils Relative: 3 %
HCT: 38.8 % (ref 36.0–46.0)
Hemoglobin: 12.3 g/dL (ref 12.0–15.0)
IMMATURE GRANULOCYTES: 0 %
Lymphocytes Relative: 23 %
Lymphs Abs: 1.5 10*3/uL (ref 0.7–4.0)
MCH: 33.5 pg (ref 26.0–34.0)
MCHC: 31.7 g/dL (ref 30.0–36.0)
MCV: 105.7 fL — AB (ref 80.0–100.0)
MONOS PCT: 11 %
Monocytes Absolute: 0.7 10*3/uL (ref 0.1–1.0)
Neutro Abs: 4 10*3/uL (ref 1.7–7.7)
Neutrophils Relative %: 62 %
Platelet Count: 213 10*3/uL (ref 150–400)
RBC: 3.67 MIL/uL — ABNORMAL LOW (ref 3.87–5.11)
RDW: 14.3 % (ref 11.5–15.5)
WBC Count: 6.4 10*3/uL (ref 4.0–10.5)
nRBC: 0 % (ref 0.0–0.2)

## 2018-09-21 LAB — FERRITIN: Ferritin: 174 ng/mL (ref 11–307)

## 2018-09-22 ENCOUNTER — Telehealth: Payer: Self-pay | Admitting: *Deleted

## 2018-09-22 NOTE — Telephone Encounter (Signed)
Notified of ferritin and CBC results. Follow up as scheduled.

## 2018-09-22 NOTE — Telephone Encounter (Signed)
-----   Message from Brittany Pier, MD sent at 09/21/2018  9:04 PM EST ----- Please call patient, hb and ferritin look good, f/u as scheduled

## 2018-09-24 DIAGNOSIS — I5031 Acute diastolic (congestive) heart failure: Secondary | ICD-10-CM | POA: Diagnosis not present

## 2018-09-24 DIAGNOSIS — I509 Heart failure, unspecified: Secondary | ICD-10-CM | POA: Diagnosis not present

## 2018-09-24 DIAGNOSIS — J9600 Acute respiratory failure, unspecified whether with hypoxia or hypercapnia: Secondary | ICD-10-CM | POA: Diagnosis not present

## 2018-09-24 DIAGNOSIS — J181 Lobar pneumonia, unspecified organism: Secondary | ICD-10-CM | POA: Diagnosis not present

## 2018-09-24 DIAGNOSIS — J9601 Acute respiratory failure with hypoxia: Secondary | ICD-10-CM | POA: Diagnosis not present

## 2018-09-29 ENCOUNTER — Other Ambulatory Visit: Payer: PPO

## 2018-09-29 DIAGNOSIS — I5031 Acute diastolic (congestive) heart failure: Secondary | ICD-10-CM | POA: Diagnosis not present

## 2018-09-29 DIAGNOSIS — J181 Lobar pneumonia, unspecified organism: Secondary | ICD-10-CM | POA: Diagnosis not present

## 2018-09-29 DIAGNOSIS — J9601 Acute respiratory failure with hypoxia: Secondary | ICD-10-CM | POA: Diagnosis not present

## 2018-09-29 DIAGNOSIS — I509 Heart failure, unspecified: Secondary | ICD-10-CM | POA: Diagnosis not present

## 2018-09-29 DIAGNOSIS — J449 Chronic obstructive pulmonary disease, unspecified: Secondary | ICD-10-CM | POA: Diagnosis not present

## 2018-10-03 ENCOUNTER — Telehealth: Payer: Self-pay | Admitting: Physician Assistant

## 2018-10-03 NOTE — Telephone Encounter (Signed)
Rx to discontinue placed on providers desk

## 2018-10-05 DIAGNOSIS — I5031 Acute diastolic (congestive) heart failure: Secondary | ICD-10-CM | POA: Diagnosis not present

## 2018-10-05 DIAGNOSIS — I509 Heart failure, unspecified: Secondary | ICD-10-CM | POA: Diagnosis not present

## 2018-10-05 DIAGNOSIS — J449 Chronic obstructive pulmonary disease, unspecified: Secondary | ICD-10-CM | POA: Diagnosis not present

## 2018-10-05 DIAGNOSIS — J9601 Acute respiratory failure with hypoxia: Secondary | ICD-10-CM | POA: Diagnosis not present

## 2018-10-07 NOTE — Telephone Encounter (Signed)
Pt aware Rx was faxed to [facility] Faxed on 10/05/18

## 2018-10-11 ENCOUNTER — Other Ambulatory Visit: Payer: Self-pay | Admitting: Physician Assistant

## 2018-10-11 MED ORDER — CYCLOBENZAPRINE HCL 10 MG PO TABS: 10.0000 mg | ORAL_TABLET | Freq: Three times a day (TID) | ORAL | 0 refills | Status: DC | PRN

## 2018-10-11 NOTE — Telephone Encounter (Signed)
Patient aware.

## 2018-10-11 NOTE — Telephone Encounter (Signed)
sent 

## 2018-10-16 ENCOUNTER — Other Ambulatory Visit: Payer: Self-pay | Admitting: Nurse Practitioner

## 2018-10-16 DIAGNOSIS — F411 Generalized anxiety disorder: Secondary | ICD-10-CM

## 2018-10-19 ENCOUNTER — Ambulatory Visit: Payer: Self-pay | Admitting: Licensed Clinical Social Worker

## 2018-10-19 DIAGNOSIS — K219 Gastro-esophageal reflux disease without esophagitis: Secondary | ICD-10-CM

## 2018-10-19 DIAGNOSIS — F411 Generalized anxiety disorder: Secondary | ICD-10-CM

## 2018-10-19 DIAGNOSIS — E43 Unspecified severe protein-calorie malnutrition: Secondary | ICD-10-CM

## 2018-10-19 DIAGNOSIS — J189 Pneumonia, unspecified organism: Secondary | ICD-10-CM

## 2018-10-19 NOTE — Patient Instructions (Signed)
Licensed Clinical Social Worker Visit Information  Goals we discussed today:   Materials provided: No  Ms. Tommi Rumps Rayne Du, were given information about Chronic Care Management services today including:  1. CCM service includes personalized support from designated clinical staff supervised by her physician, including individualized plan of care and coordination with other care providers 2. 24/7 contact phone numbers for assistance for urgent and routine care needs. 3. Service will only be billed when office clinical staff spend 20 minutes or more in a month to coordinate care. 4. Only one practitioner may furnish and bill the service in a calendar month. 5. The patient may stop CCM services at any time (effective at the end of the month) by phone call to the office staff. 6. The patient will be responsible for cost sharing (co-pay) of up to 20% of the service fee (after annual deductible is met).  Patient did not agree to services and wishes to consider information provided before deciding about enrollment in CCM services.    Follow Up Plan:  Client and her son, Daneen Schick, to discuss CCM program services. Son of client to call LCSW at [phone] within next week to inform LCSW of client decision regarding CCM services  The patient verbalized understanding of instructions provided today and declined a print copy of patient instruction materials.   Norva Riffle.Cornelious Bartolucci MSW, LCSW Licensed Clinical Social Worker [facility] [phone]

## 2018-10-19 NOTE — Chronic Care Management (AMB) (Signed)
  Care Management Note   ROZANNE HEUMANN is a 68 y.o. year old female who is a primary care patient of Terald Sleeper, PA-C. The CM team was consulted for assistance with chronic disease management and care coordination.   I reached out to [place],  by phone today.   Ms. Ivie Rayne Du, were given information about Chronic Care Management services today including:  1. CCM service includes personalized support from designated clinical staff supervised by her physician, including individualized plan of care and coordination with other care providers 2. 24/7 contact phone numbers for assistance for urgent and routine care needs. 3. Service will only be billed when office clinical staff spend 20 minutes or more in a month to coordinate care. 4. Only one practitioner may furnish and bill the service in a calendar month. 5. The patient may stop CCM services at any time (effective at the end of the month) by phone call to the office staff. 6. The patient will be responsible for cost sharing (co-pay) of up to 20% of the service fee (after annual deductible is met). Patient did not agree to services and wishes to consider information provided before deciding about enrollment in CCM services.    Review of patient status, including review of consultants reports, relevant laboratory and other test results, and collaboration with appropriate care team members and the patient's provider was performed as part of comprehensive patient evaluation and provision of chronic care management services.   LCSW provided information about CCM program services. Client and her son, Daneen Schick, plan to discuss CCM program services .Son of client said he planned to call LCSW at [phone] to inform LCSW of patient decision regarding CCM services  Follow Up Plan: Daneen Schick, son of client, to call LCSW within the next week to inform LCSW of patient decision regarding CCM services  Norva Riffle.Bernell Haynie MSW, LCSW  Licensed Clinical Social Worker [facility] [phone]

## 2018-10-21 ENCOUNTER — Other Ambulatory Visit: Payer: Self-pay | Admitting: Physician Assistant

## 2018-10-21 MED ORDER — DICLOFENAC SODIUM 1 % TD GEL: 4.0000 g | Freq: Four times a day (QID) | TRANSDERMAL | 11 refills | Status: DC

## 2018-10-21 MED ORDER — CYCLOBENZAPRINE HCL 10 MG PO TABS: 10.0000 mg | ORAL_TABLET | Freq: Three times a day (TID) | ORAL | 0 refills | Status: DC | PRN

## 2018-10-21 NOTE — Telephone Encounter (Signed)
Patient aware.

## 2018-10-21 NOTE — Telephone Encounter (Signed)
Medications sent

## 2018-11-04 ENCOUNTER — Ambulatory Visit: Payer: PPO | Admitting: Physician Assistant

## 2018-11-23 DIAGNOSIS — I509 Heart failure, unspecified: Secondary | ICD-10-CM | POA: Diagnosis not present

## 2018-11-23 DIAGNOSIS — J9601 Acute respiratory failure with hypoxia: Secondary | ICD-10-CM | POA: Diagnosis not present

## 2018-11-23 DIAGNOSIS — I5031 Acute diastolic (congestive) heart failure: Secondary | ICD-10-CM | POA: Diagnosis not present

## 2018-11-28 DIAGNOSIS — I5031 Acute diastolic (congestive) heart failure: Secondary | ICD-10-CM | POA: Diagnosis not present

## 2018-11-28 DIAGNOSIS — J449 Chronic obstructive pulmonary disease, unspecified: Secondary | ICD-10-CM | POA: Diagnosis not present

## 2018-11-28 DIAGNOSIS — I509 Heart failure, unspecified: Secondary | ICD-10-CM | POA: Diagnosis not present

## 2018-11-28 DIAGNOSIS — J9601 Acute respiratory failure with hypoxia: Secondary | ICD-10-CM | POA: Diagnosis not present

## 2018-12-07 ENCOUNTER — Ambulatory Visit (INDEPENDENT_AMBULATORY_CARE_PROVIDER_SITE_OTHER): Payer: PPO | Admitting: Physician Assistant

## 2018-12-07 ENCOUNTER — Encounter: Payer: Self-pay | Admitting: Physician Assistant

## 2018-12-07 ENCOUNTER — Other Ambulatory Visit: Payer: Self-pay

## 2018-12-07 DIAGNOSIS — K219 Gastro-esophageal reflux disease without esophagitis: Secondary | ICD-10-CM | POA: Diagnosis not present

## 2018-12-07 DIAGNOSIS — Z0000 Encounter for general adult medical examination without abnormal findings: Secondary | ICD-10-CM

## 2018-12-07 DIAGNOSIS — E034 Atrophy of thyroid (acquired): Secondary | ICD-10-CM | POA: Diagnosis not present

## 2018-12-07 DIAGNOSIS — F411 Generalized anxiety disorder: Secondary | ICD-10-CM

## 2018-12-07 DIAGNOSIS — D500 Iron deficiency anemia secondary to blood loss (chronic): Secondary | ICD-10-CM | POA: Diagnosis not present

## 2018-12-07 MED ORDER — SERTRALINE HCL 100 MG PO TABS: 200.0000 mg | ORAL_TABLET | Freq: Every day | ORAL | 1 refills | Status: DC

## 2018-12-07 MED ORDER — OMEPRAZOLE 40 MG PO CPDR: 40.0000 mg | DELAYED_RELEASE_CAPSULE | Freq: Every day | ORAL | 1 refills | Status: DC

## 2018-12-07 MED ORDER — SUCRALFATE 1 G PO TABS: 1.0000 g | ORAL_TABLET | Freq: Three times a day (TID) | ORAL | 5 refills | Status: DC

## 2018-12-07 MED ORDER — ALPRAZOLAM 0.5 MG PO TABS: 0.5000 mg | ORAL_TABLET | Freq: Four times a day (QID) | ORAL | 5 refills | Status: DC | PRN

## 2018-12-07 MED ORDER — PROAIR HFA 108 (90 BASE) MCG/ACT IN AERS: INHALATION_SPRAY | RESPIRATORY_TRACT | 11 refills | Status: DC

## 2018-12-07 MED ORDER — LEVOTHYROXINE SODIUM 100 MCG PO TABS: 100.0000 ug | ORAL_TABLET | Freq: Every morning | ORAL | 1 refills | Status: DC

## 2018-12-07 NOTE — Progress Notes (Signed)
Telephone visit  Subjective: CC: Recheck on all chronic medical conditions PCP: Terald Sleeper, PA-C CBU:LAGTXM TRELLIS GUIRGUIS is a 68 y.o. female calls for telephone consult today. Patient provides verbal consent for consult held via phone.  Patient is identified with 2 separate identifiers.  At this time the entire area is on COVID-19 social distancing and stay home orders are in place.  Patient is of higher risk and therefore we are performing this by a virtual method.  Location of patient: Home Location of provider: HOME Others present for call: No  Patient is for a periodic recheck on all of her chronic medical conditions.  She does have GERD, hypothyroidism, anemia, anxiety, history of pneumonia and hospitalizations.  She states that she has been very good over the past few months with her breathing.  And does not really have to use her inhalers at this time.  That she is been a long time without this happening.  She is able to stay home when people bring her think so that she is not getting out and exposing herself to COVID-19.  She states that her GERD is still well controlled with her medications.  She does need lab work.  We will place a lab order for future labs and she will come in later this summer and have it rechecked.  At this time she does not really have any other complaints.  She is supposed to see her orthopedist in coming weeks.   ROS: Per HPI  Allergies  Allergen Reactions   Augmentin [Amoxicillin-Pot Clavulanate] Other (See Comments)    Bloody stool Has patient had a PCN reaction causing immediate rash, facial/tongue/throat swelling, SOB or lightheadedness with hypotension: No Has patient had a PCN reaction causing severe rash involving mucus membranes or skin necrosis: No Has patient had a PCN reaction that required hospitalization: No Has patient had a PCN reaction occurring within the last 10 years: Yes If all of the above answers are "NO", then may proceed with  Cephalosporin use.    Famotidine Other (See Comments)    Fever    Klonopin [Clonazepam] Other (See Comments)    REACTION: double vision   Nitrofurantoin Rash   Reglan [Metoclopramide] Anxiety and Other (See Comments)    Causes confusion   Sulfamethoxazole-Trimethoprim Rash   Past Medical History:  Diagnosis Date   Anemia    iron    Anxiety    Diverticulitis    Hx: of   GERD (gastroesophageal reflux disease)    at times   History of kidney stones    10 years ago   Hypothyroidism    Low iron    Pneumonia    Rosacea    Hx: of    Current Outpatient Medications:    acetaminophen (TYLENOL) 500 MG tablet, Take 1,000 mg by mouth 2 (two) times daily. , Disp: , Rfl:    ALPRAZolam (XANAX) 0.5 MG tablet, Take 1 tablet (0.5 mg total) by mouth 4 (four) times daily as needed for anxiety., Disp: 120 tablet, Rfl: 5   Ascorbic Acid (VITAMIN C) 500 MG CHEW, Chew 500 mg by mouth 2 (two) times daily., Disp: , Rfl:    aspirin EC 81 MG tablet, Take 81 mg by mouth daily., Disp: , Rfl:    Calcium Carbonate (CALCIUM 600 PO), Take 600 mg by mouth 2 (two) times daily., Disp: , Rfl:    Cholecalciferol (VITAMIN D) 2000 units tablet, Take 2,000 Units by mouth 2 (two) times daily., Disp: ,  Rfl:    cyclobenzaprine (FLEXERIL) 10 MG tablet, Take 1 tablet (10 mg total) by mouth 3 (three) times daily as needed for muscle spasms., Disp: 30 tablet, Rfl: 0   diclofenac sodium (VOLTAREN) 1 % GEL, Apply 4 g topically 4 (four) times daily., Disp: 400 g, Rfl: 11   docusate sodium (COLACE) 100 MG capsule, Take 1 capsule (100 mg total) by mouth 2 (two) times daily. (Patient taking differently: Take 100 mg by mouth daily. ), Disp: 10 capsule, Rfl: 0   Flaxseed, Linseed, (FLAXSEED OIL) 1200 MG CAPS, Take 1,200 mg by mouth daily with lunch., Disp: , Rfl:    fluticasone furoate-vilanterol (BREO ELLIPTA) 200-25 MCG/INH AEPB, Inhale 1 puff into the lungs every evening., Disp: , Rfl:    levothyroxine  (SYNTHROID) 100 MCG tablet, Take 1 tablet (100 mcg total) by mouth every morning., Disp: 90 tablet, Rfl: 1   loratadine (CLARITIN) 10 MG tablet, Take 20 mg by mouth daily. , Disp: , Rfl:    Multiple Vitamins-Minerals (CENTRUM SILVER) CHEW, Chew 1 tablet by mouth 2 (two) times daily., Disp: , Rfl:    nystatin (MYCOSTATIN) 100000 UNIT/ML suspension, Take 5 mLs by mouth 4 (four) times daily., Disp: , Rfl:    omeprazole (PRILOSEC) 40 MG capsule, Take 1 capsule (40 mg total) by mouth daily., Disp: 90 capsule, Rfl: 1   OXYGEN, Inhale 2 L into the lungs as needed (for shortness of breath). , Disp: , Rfl:    PROAIR HFA 108 (90 Base) MCG/ACT inhaler, USE 2 PUFFS EVERY 6 HOURS AS NEEDED, Disp: 8.5 g, Rfl: 11   Probiotic Product (PROBIOTIC PO), Take 1 capsule by mouth daily with lunch. , Disp: , Rfl:    sertraline (ZOLOFT) 100 MG tablet, Take 2 tablets (200 mg total) by mouth daily., Disp: 180 tablet, Rfl: 1   sucralfate (CARAFATE) 1 g tablet, Take 1 tablet (1 g total) by mouth 3 (three) times daily with meals., Disp: 90 tablet, Rfl: 5  Assessment/ Plan: 68 y.o. female   1. GAD (generalized anxiety disorder) - ALPRAZolam (XANAX) 0.5 MG tablet; Take 1 tablet (0.5 mg total) by mouth 4 (four) times daily as needed for anxiety.  Dispense: 120 tablet; Refill: 5 - sertraline (ZOLOFT) 100 MG tablet; Take 2 tablets (200 mg total) by mouth daily.  Dispense: 180 tablet; Refill: 1  2. Iron deficiency anemia due to chronic blood loss - sucralfate (CARAFATE) 1 g tablet; Take 1 tablet (1 g total) by mouth 3 (three) times daily with meals.  Dispense: 90 tablet; Refill: 5  3. Gastroesophageal reflux disease without esophagitis - omeprazole (PRILOSEC) 40 MG capsule; Take 1 capsule (40 mg total) by mouth daily.  Dispense: 90 capsule; Refill: 1  4. Hypothyroidism due to acquired atrophy of thyroid - Thyroid Panel With TSH; Future - levothyroxine (SYNTHROID) 100 MCG tablet; Take 1 tablet (100 mcg total) by mouth  every morning.  Dispense: 90 tablet; Refill: 1  5. Well adult exam - CBC with Differential/Platelet; Future - CMP14+EGFR; Future - Lipid panel; Future - Thyroid Panel With TSH; Future *  Start time: 11:00 AM End time: 11:16 AM  Meds ordered this encounter  Medications   ALPRAZolam (XANAX) 0.5 MG tablet    Sig: Take 1 tablet (0.5 mg total) by mouth 4 (four) times daily as needed for anxiety.    Dispense:  120 tablet    Refill:  5    Order Specific Question:   Supervising Provider    Answer:   Lajuana Ripple,  ASHLY M [[id number]]   sertraline (ZOLOFT) 100 MG tablet    Sig: Take 2 tablets (200 mg total) by mouth daily.    Dispense:  180 tablet    Refill:  1    Order Specific Question:   Supervising Provider    Answer:   Janora Norlander [[id number]]   sucralfate (CARAFATE) 1 g tablet    Sig: Take 1 tablet (1 g total) by mouth 3 (three) times daily with meals.    Dispense:  90 tablet    Refill:  5    Order Specific Question:   Supervising Provider    Answer:   Janora Norlander [[id number]]   omeprazole (PRILOSEC) 40 MG capsule    Sig: Take 1 capsule (40 mg total) by mouth daily.    Dispense:  90 capsule    Refill:  1    Order Specific Question:   Supervising Provider    Answer:   Janora Norlander [[id number]]   levothyroxine (SYNTHROID) 100 MCG tablet    Sig: Take 1 tablet (100 mcg total) by mouth every morning.    Dispense:  90 tablet    Refill:  1    Order Specific Question:   Supervising Provider    Answer:   Janora Norlander [[id number]]   PROAIR HFA 108 (90 Base) MCG/ACT inhaler    Sig: USE 2 PUFFS EVERY 6 HOURS AS NEEDED    Dispense:  8.5 g    Refill:  11    Keep on file    Order Specific Question:   Supervising Provider    Answer:   Janora Norlander [[id number]]    Particia Nearing PA-C [place] [phone]

## 2018-12-13 ENCOUNTER — Ambulatory Visit: Payer: PPO

## 2018-12-14 ENCOUNTER — Ambulatory Visit: Payer: Self-pay | Admitting: Orthopedic Surgery

## 2018-12-23 DIAGNOSIS — J9601 Acute respiratory failure with hypoxia: Secondary | ICD-10-CM | POA: Diagnosis not present

## 2018-12-23 DIAGNOSIS — I5031 Acute diastolic (congestive) heart failure: Secondary | ICD-10-CM | POA: Diagnosis not present

## 2018-12-23 DIAGNOSIS — I509 Heart failure, unspecified: Secondary | ICD-10-CM | POA: Diagnosis not present

## 2018-12-28 ENCOUNTER — Ambulatory Visit (INDEPENDENT_AMBULATORY_CARE_PROVIDER_SITE_OTHER): Payer: PPO

## 2018-12-28 ENCOUNTER — Encounter: Payer: Self-pay | Admitting: Orthopedic Surgery

## 2018-12-28 ENCOUNTER — Other Ambulatory Visit: Payer: Self-pay

## 2018-12-28 ENCOUNTER — Ambulatory Visit (INDEPENDENT_AMBULATORY_CARE_PROVIDER_SITE_OTHER): Payer: PPO | Admitting: Orthopedic Surgery

## 2018-12-28 VITALS — BP 119/72 | HR 88 | Temp 98.2°F | Ht 65.0 in | Wt 101.0 lb

## 2018-12-28 DIAGNOSIS — Z96642 Presence of left artificial hip joint: Secondary | ICD-10-CM

## 2018-12-28 DIAGNOSIS — Z96641 Presence of right artificial hip joint: Secondary | ICD-10-CM

## 2018-12-28 DIAGNOSIS — J9601 Acute respiratory failure with hypoxia: Secondary | ICD-10-CM | POA: Diagnosis not present

## 2018-12-28 DIAGNOSIS — I5031 Acute diastolic (congestive) heart failure: Secondary | ICD-10-CM | POA: Diagnosis not present

## 2018-12-28 DIAGNOSIS — I509 Heart failure, unspecified: Secondary | ICD-10-CM | POA: Diagnosis not present

## 2018-12-28 DIAGNOSIS — J449 Chronic obstructive pulmonary disease, unspecified: Secondary | ICD-10-CM | POA: Diagnosis not present

## 2018-12-28 NOTE — Progress Notes (Signed)
Chief Complaint  Patient presents with  . Routine Post Op    left hip replacement 02/17/18 right hip replacement 12/30/17   68 year old female had 2 sequential femoral neck fractures with no trauma this was treated with bilateral total hips sequentially temporally related to each fracture she has no major complaints  Review of Systems  Musculoskeletal:       Occasional pain right thigh   BP 119/72   Pulse 88   Temp 98.2 F (36.8 C)   Ht 5\' 5"  (1.651 m)   Wt 101 lb (45.8 kg)   BMI 16.81 kg/m  Physical Exam Vitals signs and nursing note reviewed.  Constitutional:      Appearance: Normal appearance. She is normal weight.  Musculoskeletal:     Right hip: She exhibits normal range of motion, normal strength, no tenderness, no bony tenderness, no swelling, no crepitus and no deformity.     Left hip: She exhibits normal range of motion, normal strength, no tenderness, no bony tenderness, no swelling, no crepitus and no deformity.  Neurological:     Mental Status: She is alert.     Gait: Gait normal.    Plain films of the pelvis AP lateral both hips see the report.  Both hips are stable in good position there is some HO in the left hip grade 1  Follow-up in a year repeat x-ray

## 2019-01-05 ENCOUNTER — Other Ambulatory Visit: Payer: Self-pay

## 2019-01-06 ENCOUNTER — Encounter: Payer: Self-pay | Admitting: Physician Assistant

## 2019-01-06 ENCOUNTER — Ambulatory Visit (INDEPENDENT_AMBULATORY_CARE_PROVIDER_SITE_OTHER): Payer: PPO

## 2019-01-06 ENCOUNTER — Ambulatory Visit (INDEPENDENT_AMBULATORY_CARE_PROVIDER_SITE_OTHER): Payer: PPO | Admitting: Physician Assistant

## 2019-01-06 VITALS — BP 126/72 | HR 91 | Temp 98.8°F | Ht 65.0 in | Wt 100.4 lb

## 2019-01-06 DIAGNOSIS — E034 Atrophy of thyroid (acquired): Secondary | ICD-10-CM | POA: Diagnosis not present

## 2019-01-06 DIAGNOSIS — M25531 Pain in right wrist: Secondary | ICD-10-CM

## 2019-01-06 DIAGNOSIS — D500 Iron deficiency anemia secondary to blood loss (chronic): Secondary | ICD-10-CM | POA: Diagnosis not present

## 2019-01-07 LAB — CMP14+EGFR
ALT: 20 IU/L (ref 0–32)
AST: 18 IU/L (ref 0–40)
Albumin/Globulin Ratio: 2.1 (ref 1.2–2.2)
Albumin: 3.8 g/dL (ref 3.8–4.8)
Alkaline Phosphatase: 61 IU/L (ref 39–117)
BUN/Creatinine Ratio: 35 — ABNORMAL HIGH (ref 12–28)
BUN: 26 mg/dL (ref 8–27)
Bilirubin Total: <0.2 mg/dL (ref 0.0–1.2)
CO2: 18 mmol/L — ABNORMAL LOW (ref 20–29)
Calcium: 9.5 mg/dL (ref 8.7–10.3)
Chloride: 109 mmol/L — ABNORMAL HIGH (ref 96–106)
Creatinine, Ser: 0.74 mg/dL (ref 0.57–1.00)
GFR calc Af Amer: 96 mL/min/{1.73_m2} (ref >59)
GFR calc non Af Amer: 84 mL/min/{1.73_m2} (ref >59)
Globulin, Total: 1.8 g/dL (ref 1.5–4.5)
Glucose: 98 mg/dL (ref 65–99)
Potassium: 4.5 mmol/L (ref 3.5–5.2)
Sodium: 141 mmol/L (ref 134–144)
Total Protein: 5.6 g/dL — ABNORMAL LOW (ref 6.0–8.5)

## 2019-01-07 LAB — CBC WITH DIFFERENTIAL/PLATELET
Basophils Absolute: 0 10*3/uL (ref 0.0–0.2)
Basos: 1 %
EOS (ABSOLUTE): 0.3 10*3/uL (ref 0.0–0.4)
Eos: 5 %
Hematocrit: 40.4 % (ref 34.0–46.6)
Hemoglobin: 12.7 g/dL (ref 11.1–15.9)
Immature Grans (Abs): 0 10*3/uL (ref 0.0–0.1)
Immature Granulocytes: 0 %
Lymphocytes Absolute: 1.6 10*3/uL (ref 0.7–3.1)
Lymphs: 29 %
MCH: 32.3 pg (ref 26.6–33.0)
MCHC: 31.4 g/dL — ABNORMAL LOW (ref 31.5–35.7)
MCV: 103 fL — ABNORMAL HIGH (ref 79–97)
Monocytes Absolute: 0.7 10*3/uL (ref 0.1–0.9)
Monocytes: 13 %
Neutrophils Absolute: 2.9 10*3/uL (ref 1.4–7.0)
Neutrophils: 52 %
Platelets: 267 10*3/uL (ref 150–450)
RBC: 3.93 x10E6/uL (ref 3.77–5.28)
RDW: 12.6 % (ref 11.7–15.4)
WBC: 5.5 10*3/uL (ref 3.4–10.8)

## 2019-01-07 LAB — FERRITIN: Ferritin: 98 ng/mL (ref 15–150)

## 2019-01-07 LAB — TSH: TSH: 2.71 u[IU]/mL (ref 0.450–4.500)

## 2019-01-09 ENCOUNTER — Encounter: Payer: Self-pay | Admitting: *Deleted

## 2019-01-10 ENCOUNTER — Encounter: Payer: Self-pay | Admitting: Physician Assistant

## 2019-01-10 NOTE — Progress Notes (Signed)
BP 126/72   Pulse 91   Temp 98.8 F (37.1 C) (Oral)   Ht _0  (1.651 m)   Wt 100 lb 6.4 oz (45.5 kg)   BMI 16.71 kg/m    Subjective:    Patient ID: Brittany Hensley, female    DOB: 27-Feb-1951, 68 y.o.   MRN: [id number]  HPI: Brittany Hensley is a 68 y.o. female presenting on 01/06/2019 for right wrist pain  Patient comes in for severe right wrist pain.  She does not recall any specific injury.  And when she uses arm she has a great increase of pain.  There is a lot more swelling.  Sometimes it will feel warm.  We will perform x-ray today to see if there are any arthritic changes or anything that needs to have referral.  She was recently seen by Dr. Aline Brochure for the recheck on her bilateral hip replacements.  And he said everything looks good in that area.  She also needs her thyroid lab check.  She has had hypothyroid of the for some time.  We will have labs performed today.  After reviewing her chart this patient has become my patient just in recent months.  And she does chronically take alprazolam for generalized anxiety disorder.  The PDMP is reviewed and there are no red flags.   Her LME equivalency is 3. At the next visit we will update a contract and obtain urine drug screen to continue her medication.  Past Medical History:  Diagnosis Date  . Anemia    iron   . Anxiety   . Diverticulitis    Hx: of  . GERD (gastroesophageal reflux disease)    at times  . History of kidney stones    10 years ago  . Hypothyroidism   . Low iron   . Pneumonia   . Rosacea    Hx: of   Relevant past medical, surgical, family and social history reviewed and updated as indicated. Interim medical history since our last visit reviewed. Allergies and medications reviewed and updated. DATA REVIEWED: CHART IN EPIC  Family History reviewed for pertinent findings.  Review of Systems  Constitutional: Negative.   HENT: Negative.   Eyes: Negative.   Respiratory: Negative.   Gastrointestinal:  Negative.   Genitourinary: Negative.   Musculoskeletal: Positive for arthralgias and joint swelling.    Allergies as of 01/06/2019      Reactions   Augmentin [amoxicillin-pot Clavulanate] Other (See Comments)   Bloody stool Has patient had a PCN reaction causing immediate rash, facial/tongue/throat swelling, SOB or lightheadedness with hypotension: No Has patient had a PCN reaction causing severe rash involving mucus membranes or skin necrosis: No Has patient had a PCN reaction that required hospitalization: No Has patient had a PCN reaction occurring within the last 10 years: Yes If all of the above answers are "NO", then may proceed with Cephalosporin use.   Famotidine Other (See Comments)   Fever   Klonopin [clonazepam] Other (See Comments)   REACTION: double vision   Voltaren [diclofenac Sodium]    Topical made nervous   Nitrofurantoin Rash   Reglan [metoclopramide] Anxiety, Other (See Comments)   Causes confusion   Sulfamethoxazole-trimethoprim Rash      Medication List       Accurate as of January 06, 2019 11:59 PM. If you have any questions, ask your nurse or doctor.        STOP taking these medications   Breo Ellipta 200-25 MCG/INH Aepb Generic  drug:  fluticasone furoate-vilanterol Stopped by:  Terald Sleeper, PA-C   cyclobenzaprine 10 MG tablet Commonly known as:  FLEXERIL Stopped by:  Terald Sleeper, PA-C   diclofenac sodium 1 % Gel Commonly known as:  VOLTAREN Stopped by:  Terald Sleeper, PA-C   nystatin 100000 UNIT/ML suspension Commonly known as:  MYCOSTATIN Stopped by:  Terald Sleeper, PA-C   OXYGEN Stopped by:  Terald Sleeper, PA-C     TAKE these medications   acetaminophen 500 MG tablet Commonly known as:  TYLENOL Take 1,000 mg by mouth 2 (two) times daily.   ALPRAZolam 0.5 MG tablet Commonly known as:  XANAX Take 1 tablet (0.5 mg total) by mouth 4 (four) times daily as needed for anxiety.   aspirin EC 81 MG tablet Take 81 mg by mouth daily.    CALCIUM 600 PO Take 600 mg by mouth 2 (two) times daily.   Centrum Silver Chew Chew 1 tablet by mouth 2 (two) times daily.   docusate sodium 100 MG capsule Commonly known as:  COLACE Take 1 capsule (100 mg total) by mouth 2 (two) times daily. What changed:  when to take this   Flaxseed Oil 1200 MG Caps Take 1,200 mg by mouth daily with lunch.   levothyroxine 100 MCG tablet Commonly known as:  SYNTHROID Take 1 tablet (100 mcg total) by mouth every morning.   loratadine 10 MG tablet Commonly known as:  CLARITIN Take 20 mg by mouth daily.   omeprazole 40 MG capsule Commonly known as:  PRILOSEC Take 1 capsule (40 mg total) by mouth daily.   ProAir HFA 108 (90 Base) MCG/ACT inhaler Generic drug:  albuterol USE 2 PUFFS EVERY 6 HOURS AS NEEDED   PROBIOTIC PO Take 1 capsule by mouth daily with lunch.   sertraline 100 MG tablet Commonly known as:  ZOLOFT Take 2 tablets (200 mg total) by mouth daily.   sucralfate 1 g tablet Commonly known as:  CARAFATE Take 1 tablet (1 g total) by mouth 3 (three) times daily with meals.   Vitamin C 500 MG Chew Chew 500 mg by mouth 2 (two) times daily.   Vitamin D 50 MCG (2000 UT) tablet Take 2,000 Units by mouth 2 (two) times daily.          Objective:    BP 126/72   Pulse 91   Temp 98.8 F (37.1 C) (Oral)   Ht _0  (1.651 m)   Wt 100 lb 6.4 oz (45.5 kg)   BMI 16.71 kg/m   Allergies  Allergen Reactions  . Augmentin [Amoxicillin-Pot Clavulanate] Other (See Comments)    Bloody stool Has patient had a PCN reaction causing immediate rash, facial/tongue/throat swelling, SOB or lightheadedness with hypotension: No Has patient had a PCN reaction causing severe rash involving mucus membranes or skin necrosis: No Has patient had a PCN reaction that required hospitalization: No Has patient had a PCN reaction occurring within the last 10 years: Yes If all of the above answers are "NO", then may proceed with Cephalosporin use.   .  Famotidine Other (See Comments)    Fever   . Klonopin [Clonazepam] Other (See Comments)    REACTION: double vision  . Voltaren [Diclofenac Sodium]     Topical made nervous  . Nitrofurantoin Rash  . Reglan [Metoclopramide] Anxiety and Other (See Comments)    Causes confusion  . Sulfamethoxazole-Trimethoprim Rash    Wt Readings from Last 3 Encounters:  01/06/19 100 lb 6.4 oz (  45.5 kg)  12/28/18 101 lb (45.8 kg)  08/04/18 90 lb (40.8 kg)    Physical Exam Constitutional:      Appearance: She is well-developed.  HENT:     Head: Normocephalic and atraumatic.  Eyes:     Conjunctiva/sclera: Conjunctivae normal.     Pupils: Pupils are equal, round, and reactive to light.  Cardiovascular:     Rate and Rhythm: Normal rate and regular rhythm.     Heart sounds: Normal heart sounds.  Pulmonary:     Effort: Pulmonary effort is normal.  Musculoskeletal:     Right hand: She exhibits decreased range of motion, tenderness, bony tenderness and deformity. Decreased strength noted.       Hands:  Skin:    General: Skin is warm and dry.     Findings: No rash.  Neurological:     Mental Status: She is alert and oriented to person, place, and time.     Deep Tendon Reflexes: Reflexes are normal and symmetric.  Psychiatric:        Behavior: Behavior normal.        Thought Content: Thought content normal.        Judgment: Judgment normal.     Results for orders placed or performed in visit on 01/06/19  CBC with Differential/Platelet  Result Value Ref Range   WBC 5.5 3.4 - 10.8 x10E3/uL   RBC 3.93 3.77 - 5.28 x10E6/uL   Hemoglobin 12.7 11.1 - 15.9 g/dL   Hematocrit 40.4 34.0 - 46.6 %   MCV 103 (H) 79 - 97 fL   MCH 32.3 26.6 - 33.0 pg   MCHC 31.4 (L) 31.5 - 35.7 g/dL   RDW 12.6 11.7 - 15.4 %   Platelets 267 150 - 450 x10E3/uL   Neutrophils 52 Not Estab. %   Lymphs 29 Not Estab. %   Monocytes 13 Not Estab. %   Eos 5 Not Estab. %   Basos 1 Not Estab. %   Neutrophils Absolute 2.9 1.4 -  7.0 x10E3/uL   Lymphocytes Absolute 1.6 0.7 - 3.1 x10E3/uL   Monocytes Absolute 0.7 0.1 - 0.9 x10E3/uL   EOS (ABSOLUTE) 0.3 0.0 - 0.4 x10E3/uL   Basophils Absolute 0.0 0.0 - 0.2 x10E3/uL   Immature Granulocytes 0 Not Estab. %   Immature Grans (Abs) 0.0 0.0 - 0.1 x10E3/uL  Ferritin  Result Value Ref Range   Ferritin 98 15 - 150 ng/mL  TSH  Result Value Ref Range   TSH 2.710 0.450 - 4.500 uIU/mL  CMP14+EGFR  Result Value Ref Range   Glucose 98 65 - 99 mg/dL   BUN 26 8 - 27 mg/dL   Creatinine, Ser 0.74 0.57 - 1.00 mg/dL   GFR calc non Af Amer 84 >59 mL/min/1.73   GFR calc Af Amer 96 >59 mL/min/1.73   BUN/Creatinine Ratio 35 (H) 12 - 28   Sodium 141 134 - 144 mmol/L   Potassium 4.5 3.5 - 5.2 mmol/L   Chloride 109 (H) 96 - 106 mmol/L   CO2 18 (L) 20 - 29 mmol/L   Calcium 9.5 8.7 - 10.3 mg/dL   Total Protein 5.6 (L) 6.0 - 8.5 g/dL   Albumin 3.8 3.8 - 4.8 g/dL   Globulin, Total 1.8 1.5 - 4.5 g/dL   Albumin/Globulin Ratio 2.1 1.2 - 2.2   Bilirubin Total <0.2 0.0 - 1.2 mg/dL   Alkaline Phosphatase 61 39 - 117 IU/L   AST 18 0 - 40 IU/L   ALT 20 0 -  32 IU/L      Assessment & Plan:   1. Iron deficiency anemia due to chronic blood loss - CBC with Differential/Platelet - Ferritin - TSH - CMP14+EGFR  2. Hypothyroidism due to acquired atrophy of thyroid - CBC with Differential/Platelet - Ferritin - TSH - CMP14+EGFR  3. Right wrist pain - DG Wrist Complete Right; Future   Continue all other maintenance medications as listed above.  Follow up plan: Recheck 3 months  Educational handout given for [place] PA-C [place] [address]  [place], [place] [address] [phone]   01/10/2019, 7:47 AM

## 2019-01-19 ENCOUNTER — Other Ambulatory Visit: Payer: Self-pay | Admitting: Gastroenterology

## 2019-01-19 ENCOUNTER — Other Ambulatory Visit (HOSPITAL_COMMUNITY)
Admission: RE | Admit: 2019-01-19 | Discharge: 2019-01-19 | Disposition: A | Discharge status: Home / Self Care | Payer: PPO | Source: Ambulatory Visit | Attending: Gastroenterology | Admitting: Gastroenterology

## 2019-01-19 ENCOUNTER — Other Ambulatory Visit: Payer: Self-pay

## 2019-01-19 DIAGNOSIS — Z1159 Encounter for screening for other viral diseases: Secondary | ICD-10-CM | POA: Diagnosis not present

## 2019-01-20 ENCOUNTER — Encounter (HOSPITAL_COMMUNITY): Payer: Self-pay | Admitting: Emergency Medicine

## 2019-01-20 LAB — NOVEL CORONAVIRUS, NAA (HOSP ORDER, SEND-OUT TO REF LAB; TAT 18-24 HRS): SARS-CoV-2, NAA: NOT DETECTED

## 2019-01-20 NOTE — Progress Notes (Signed)
sw patient, regarding Monday January 23, 2019 procedure.  Pt states to arrive at 1130am.  Understands covid results have not returned as of yet.  Answered questions regarding procedure.  Pt verbalize understanding of self quarantine.

## 2019-01-20 NOTE — Progress Notes (Signed)
Attempted to contact regarding Monday January 23, 2019 procedure, busy x 2

## 2019-01-23 ENCOUNTER — Other Ambulatory Visit: Payer: Self-pay

## 2019-01-23 ENCOUNTER — Ambulatory Visit (HOSPITAL_COMMUNITY): Payer: PPO | Admitting: Registered Nurse

## 2019-01-23 ENCOUNTER — Ambulatory Visit (HOSPITAL_COMMUNITY)
Admission: RE | Admit: 2019-01-23 | Discharge: 2019-01-23 | Disposition: A | Discharge status: Home / Self Care | Payer: PPO | Attending: Gastroenterology | Admitting: Gastroenterology

## 2019-01-23 ENCOUNTER — Encounter (HOSPITAL_COMMUNITY): Payer: Self-pay | Admitting: *Deleted

## 2019-01-23 ENCOUNTER — Encounter (HOSPITAL_COMMUNITY)
Admission: RE | Disposition: A | Discharge status: Home / Self Care | Payer: Self-pay | Source: Home / Self Care | Attending: Gastroenterology

## 2019-01-23 DIAGNOSIS — E039 Hypothyroidism, unspecified: Secondary | ICD-10-CM | POA: Insufficient documentation

## 2019-01-23 DIAGNOSIS — K2950 Unspecified chronic gastritis without bleeding: Secondary | ICD-10-CM | POA: Diagnosis not present

## 2019-01-23 DIAGNOSIS — Z87442 Personal history of urinary calculi: Secondary | ICD-10-CM | POA: Diagnosis not present

## 2019-01-23 DIAGNOSIS — Z96643 Presence of artificial hip joint, bilateral: Secondary | ICD-10-CM | POA: Insufficient documentation

## 2019-01-23 DIAGNOSIS — I5030 Unspecified diastolic (congestive) heart failure: Secondary | ICD-10-CM | POA: Insufficient documentation

## 2019-01-23 DIAGNOSIS — K9189 Other postprocedural complications and disorders of digestive system: Secondary | ICD-10-CM | POA: Diagnosis not present

## 2019-01-23 DIAGNOSIS — D649 Anemia, unspecified: Secondary | ICD-10-CM | POA: Diagnosis not present

## 2019-01-23 DIAGNOSIS — Z980 Intestinal bypass and anastomosis status: Secondary | ICD-10-CM | POA: Diagnosis not present

## 2019-01-23 DIAGNOSIS — F419 Anxiety disorder, unspecified: Secondary | ICD-10-CM | POA: Diagnosis not present

## 2019-01-23 DIAGNOSIS — I5031 Acute diastolic (congestive) heart failure: Secondary | ICD-10-CM | POA: Diagnosis not present

## 2019-01-23 DIAGNOSIS — Z8249 Family history of ischemic heart disease and other diseases of the circulatory system: Secondary | ICD-10-CM | POA: Insufficient documentation

## 2019-01-23 DIAGNOSIS — Z888 Allergy status to other drugs, medicaments and biological substances status: Secondary | ICD-10-CM | POA: Diagnosis not present

## 2019-01-23 DIAGNOSIS — Z9841 Cataract extraction status, right eye: Secondary | ICD-10-CM | POA: Diagnosis not present

## 2019-01-23 DIAGNOSIS — K219 Gastro-esophageal reflux disease without esophagitis: Secondary | ICD-10-CM | POA: Diagnosis not present

## 2019-01-23 DIAGNOSIS — K449 Diaphragmatic hernia without obstruction or gangrene: Secondary | ICD-10-CM | POA: Insufficient documentation

## 2019-01-23 DIAGNOSIS — J9601 Acute respiratory failure with hypoxia: Secondary | ICD-10-CM | POA: Diagnosis not present

## 2019-01-23 DIAGNOSIS — Z881 Allergy status to other antibiotic agents status: Secondary | ICD-10-CM | POA: Insufficient documentation

## 2019-01-23 DIAGNOSIS — Z833 Family history of diabetes mellitus: Secondary | ICD-10-CM | POA: Diagnosis not present

## 2019-01-23 DIAGNOSIS — Z9842 Cataract extraction status, left eye: Secondary | ICD-10-CM | POA: Insufficient documentation

## 2019-01-23 DIAGNOSIS — I509 Heart failure, unspecified: Secondary | ICD-10-CM | POA: Diagnosis not present

## 2019-01-23 DIAGNOSIS — K5669 Other partial intestinal obstruction: Secondary | ICD-10-CM | POA: Diagnosis not present

## 2019-01-23 DIAGNOSIS — Z8042 Family history of malignant neoplasm of prostate: Secondary | ICD-10-CM | POA: Diagnosis not present

## 2019-01-23 DIAGNOSIS — K311 Adult hypertrophic pyloric stenosis: Secondary | ICD-10-CM | POA: Diagnosis not present

## 2019-01-23 DIAGNOSIS — Y839 Surgical procedure, unspecified as the cause of abnormal reaction of the patient, or of later complication, without mention of misadventure at the time of the procedure: Secondary | ICD-10-CM | POA: Diagnosis not present

## 2019-01-23 HISTORY — PX: ESOPHAGOGASTRODUODENOSCOPY (EGD) WITH PROPOFOL: SHX5813

## 2019-01-23 HISTORY — PX: BALLOON DILATION: SHX5330

## 2019-01-23 SURGERY — ESOPHAGOGASTRODUODENOSCOPY (EGD) WITH PROPOFOL
Anesthesia: Monitor Anesthesia Care

## 2019-01-23 MED ORDER — PROPOFOL 500 MG/50ML IV EMUL
INTRAVENOUS | Status: DC | PRN
  Administered 2019-01-23: 140 ug/kg/min via INTRAVENOUS

## 2019-01-23 MED ORDER — PROPOFOL 10 MG/ML IV BOLUS
INTRAVENOUS | Status: DC | PRN
  Administered 2019-01-23: 20 mg via INTRAVENOUS
  Administered 2019-01-23: 10 mg via INTRAVENOUS
  Administered 2019-01-23: 20 mg via INTRAVENOUS

## 2019-01-23 MED ORDER — SODIUM CHLORIDE 0.9 % IV SOLN: INTRAVENOUS | Status: DC

## 2019-01-23 MED ORDER — LACTATED RINGERS IV SOLN
INTRAVENOUS | Status: DC | PRN
  Administered 2019-01-23: 13:00:00 via INTRAVENOUS

## 2019-01-23 MED ORDER — PROPOFOL 10 MG/ML IV BOLUS
INTRAVENOUS | Status: AC
  Filled 2019-01-23: qty 40

## 2019-01-23 SURGICAL SUPPLY — 15 items

## 2019-01-23 NOTE — Discharge Instructions (Signed)
YOU HAD AN ENDOSCOPIC PROCEDURE TODAY: Refer to the procedure report and other information in the discharge instructions given to you for any specific questions about what was found during the examination. If this information does not answer your questions, please call [facility] office at [phone] to clarify.   YOU SHOULD EXPECT: Some feelings of bloating in the abdomen. Passage of more gas than usual. Walking can help get rid of the air that was put into your GI tract during the procedure and reduce the bloating. If you had a lower endoscopy (such as a colonoscopy or flexible sigmoidoscopy) you may notice spotting of blood in your stool or on the toilet paper. Some abdominal soreness may be present for a day or two, also.  DIET: Clear liquids only for 4 hours and if doing well may have heavier solids the rest of today and may slowly advance diet tomorrow and call if question or problem otherwise follow-up as needed. Drink plenty of fluids but you should avoid alcoholic beverages for 24 hours. If you had a esophageal dilation, please see attached instructions for diet.   ACTIVITY: Your care partner should take you home directly after the procedure. You should plan to take it easy, moving slowly for the rest of the day. You can resume normal activity the day after the procedure however YOU SHOULD NOT DRIVE, use power tools, machinery or perform tasks that involve climbing or major physical exertion for 24 hours (because of the sedation medicines used during the test).   SYMPTOMS TO REPORT IMMEDIATELY: A gastroenterologist can be reached at any hour. Please call [phone]  for any of the following symptoms:   Following upper endoscopy (EGD, EUS, ERCP, esophageal dilation) Vomiting of blood or coffee ground material  New, significant abdominal pain  New, significant chest pain or pain under the shoulder blades  Painful or persistently difficult swallowing  New shortness of breath  Black,  tarry-looking or red, bloody stools  FOLLOW UP:  If any biopsies were taken you will be contacted by phone or by letter within the next 1-3 weeks. Call [phone]  if you have not heard about the biopsies in 3 weeks.  Please also call with any specific questions about appointments or follow up tests.

## 2019-01-23 NOTE — Progress Notes (Signed)
Brittany Hensley 12:36 PM  Subjective: Patient doing well since her last dilation until about 3 weeks ago when she began having early satiety and some nausea she had been gaining weight and has not been in the hospital or had any other medical issues since we last saw her and has not had an iron infusion since the fall  Objective: Vital signs stable afebrile no acute distress exam please see preassessment evaluation recent labs reviewed  Assessment: Gastric outlet obstruction from anastomotic stricturing and peptic ulcer disease  Plan: Okay to proceed with EGD with anastomotic dilation with anesthesia assistance  [facility]  office [phone] After 5PM or if no answer call [phone]

## 2019-01-23 NOTE — Anesthesia Postprocedure Evaluation (Signed)
Anesthesia Post Note  Patient: Brittany Hensley  Procedure(s) Performed: ESOPHAGOGASTRODUODENOSCOPY (EGD) WITH PROPOFOL (N/A ) BALLOON DILATION (N/A )     Patient location during evaluation: PACU Anesthesia Type: MAC Level of consciousness: awake and alert Pain management: pain level controlled Vital Signs Assessment: post-procedure vital signs reviewed and stable Respiratory status: spontaneous breathing, nonlabored ventilation, respiratory function stable and patient connected to nasal cannula oxygen Cardiovascular status: stable and blood pressure returned to baseline Postop Assessment: no apparent nausea or vomiting Anesthetic complications: no    Last Vitals:  Vitals:   01/23/19 1325 01/23/19 1335  BP: (!) 101/59 114/63  Pulse: 79   Resp: 16 17  Temp:    SpO2: 99% 98%    Last Pain:  Vitals:   01/23/19 1335  TempSrc:   PainSc: 0-No pain                 Isaiah Cianci

## 2019-01-23 NOTE — Transfer of Care (Signed)
Immediate Anesthesia Transfer of Care Note  Patient: Brittany Hensley  Procedure(s) Performed: ESOPHAGOGASTRODUODENOSCOPY (EGD) WITH PROPOFOL (N/A ) BALLOON DILATION (N/A )  Patient Location: PACU and Endoscopy Unit  Anesthesia Type:MAC  Level of Consciousness: awake and alert   Airway & Oxygen Therapy: Patient Spontanous Breathing and Patient connected to nasal cannula oxygen  Post-op Assessment: Report given to RN and Post -op Vital signs reviewed and stable  Post vital signs: Reviewed and stable  Last Vitals:  Vitals Value Taken Time  BP    Temp    Pulse    Resp    SpO2      Last Pain:  Vitals:   01/23/19 1143  TempSrc: Oral  PainSc: 0-No pain         Complications: No apparent anesthesia complications

## 2019-01-23 NOTE — Anesthesia Preprocedure Evaluation (Addendum)
Anesthesia Evaluation  Patient identified by MRN, date of birth, ID band Patient awake    Reviewed: Allergy & Precautions, H&P , NPO status , Patient's Chart, lab work & pertinent test results  Airway Mallampati: I   Neck ROM: full    Dental  (+) Teeth Intact, Dental Advisory Given   Pulmonary neg pulmonary ROS,    breath sounds clear to auscultation       Cardiovascular +CHF  + Valvular Problems/Murmurs  Rhythm:regular Rate:Normal  Echo 11/19 Study Conclusions  - Left ventricle: The cavity size was normal. Wall thickness was   normal. Systolic function was normal. The estimated ejection   fraction was in the range of 60% to 65%. Wall motion was normal;   there were no regional wall motion abnormalities. Doppler   parameters are consistent with abnormal left ventricular   relaxation (grade 1 diastolic dysfunction).   Neuro/Psych PSYCHIATRIC DISORDERS Anxiety negative neurological ROS     GI/Hepatic GERD  ,  Endo/Other  Hypothyroidism   Renal/GU stones     Musculoskeletal   Abdominal   Peds  Hematology  (+) Blood dyscrasia, anemia ,   Anesthesia Other Findings   Reproductive/Obstetrics                            Anesthesia Physical  Anesthesia Plan  ASA: III  Anesthesia Plan: MAC   Post-op Pain Management:    Induction: Intravenous  PONV Risk Score and Plan: 2 and Ondansetron, Propofol infusion and Treatment may vary due to age or medical condition  Airway Management Planned: Nasal Cannula  Additional Equipment:   Intra-op Plan:   Post-operative Plan:   Informed Consent: I have reviewed the patients History and Physical, chart, labs and discussed the procedure including the risks, benefits and alternatives for the proposed anesthesia with the patient or authorized representative who has indicated his/her understanding and acceptance.       Plan Discussed with: CRNA,  Anesthesiologist and Surgeon  Anesthesia Plan Comments:         Anesthesia Quick Evaluation

## 2019-01-23 NOTE — Op Note (Signed)
[facility] Patient Name: Brittany Hensley Procedure Date: 01/23/2019 MRN: [id number] Attending MD: Clarene Essex , MD Date of Birth: May 07, 1951 CSN: [id number] Age: 68 Admit Type: Inpatient Procedure:                Upper GI endoscopy Indications:              For therapy of post-surgical anastomotic stenosis Providers:                Clarene Essex, MD, Cleda Daub, RN, Ladona Ridgel,                            Technician Referring MD:              Medicines:                Propofol total dose 707 mg IV Complications:            No immediate complications. Estimated Blood Loss:     Estimated blood loss: none. Procedure:                Pre-Anesthesia Assessment:                           - Prior to the procedure, a History and Physical                            was performed, and patient medications and                            allergies were reviewed. The patient's tolerance of                            previous anesthesia was also reviewed. The risks                            and benefits of the procedure and the sedation                            options and risks were discussed with the patient.                            All questions were answered, and informed consent                            was obtained. Prior Anticoagulants: The patient has                            taken no previous anticoagulant or antiplatelet                            agents. ASA Grade Assessment: II - A patient with                            mild systemic disease. After reviewing the risks  and benefits, the patient was deemed in                            satisfactory condition to undergo the procedure.                           After obtaining informed consent, the endoscope was                            passed under direct vision. Throughout the                            procedure, the patient's blood pressure, pulse, and   oxygen saturations were monitored continuously. The                            GIF-H190 ([id number]) Olympus gastroscope was                            introduced through the mouth, and advanced to the                            jejunum. The upper GI endoscopy was accomplished                            without difficulty. The patient tolerated the                            procedure well. Scope In: Scope Out: Findings:      The larynx was normal.      A small hiatal hernia was present.      Diffuse mild inflammation characterized by congestion (edema) and       erythema was found in the gastric body.      A medium amount of food (residue) was found in the gastric body.      Evidence of a gastrojejunostomy was found in the stomach. This was       characterized by mild stenosis. A TTS dilator was passed through the       scope. Dilation with an 18-19-20 mm balloon dilator was performed to 20       mm. The dilation site was examined and showed mild mucosal disruption       and moderate improvement in luminal narrowing.      The examined jejunum was normal.      The exam was otherwise without abnormality. Impression:               - Normal larynx.                           - Small hiatal hernia.                           - Chronic gastritis.                           - A medium amount of food (residue) in the stomach.                           -  A gastrojejunostomy was found, characterized by                            mild stenosis. Dilated.                           - Normal examined jejunum.                           - The examination was otherwise normal.                           - No specimens collected. Moderate Sedation:      Not Applicable - Patient had care per Anesthesia. Recommendation:           - Patient has a contact number available for                            emergencies. The signs and symptoms of potential                            delayed complications were  discussed with the                            patient. Return to normal activities tomorrow.                            Written discharge instructions were provided to the                            patient.                           - Clear liquid diet for 4 hours.                           - Continue present medications.                           - Return to GI clinic PRN.                           - Telephone GI clinic if symptomatic PRN. Procedure Code(s):        --- Professional ---                           [phone], Esophagogastroduodenoscopy, flexible,                            transoral; with dilation of gastric/duodenal                            stricture(s) (eg, balloon, bougie) Diagnosis Code(s):        --- Professional ---                           K44.9, Diaphragmatic hernia without obstruction  or                            gangrene                           K29.50, Unspecified chronic gastritis without                            bleeding                           Z98.0, Intestinal bypass and anastomosis status                           K91.89, Other postprocedural complications and                            disorders of digestive system CPT copyright 2019 [employer] Association. All rights reserved. The codes documented in this report are preliminary and upon coder review may  be revised to meet current compliance requirements. Clarene Essex, MD 01/23/2019 1:17:17 PM This report has been signed electronically. Number of Addenda: 0

## 2019-01-24 ENCOUNTER — Encounter (HOSPITAL_COMMUNITY): Payer: Self-pay | Admitting: Gastroenterology

## 2019-01-28 DIAGNOSIS — I509 Heart failure, unspecified: Secondary | ICD-10-CM | POA: Diagnosis not present

## 2019-01-28 DIAGNOSIS — I5031 Acute diastolic (congestive) heart failure: Secondary | ICD-10-CM | POA: Diagnosis not present

## 2019-01-28 DIAGNOSIS — J449 Chronic obstructive pulmonary disease, unspecified: Secondary | ICD-10-CM | POA: Diagnosis not present

## 2019-01-28 DIAGNOSIS — J9601 Acute respiratory failure with hypoxia: Secondary | ICD-10-CM | POA: Diagnosis not present

## 2019-01-31 ENCOUNTER — Telehealth: Payer: Self-pay | Admitting: Oncology

## 2019-01-31 NOTE — Telephone Encounter (Signed)
Called pt per 6/30 sch message - unable to reach pt left message with appt date and time

## 2019-02-02 ENCOUNTER — Other Ambulatory Visit: Payer: PPO

## 2019-02-06 ENCOUNTER — Other Ambulatory Visit: Payer: Self-pay

## 2019-02-06 ENCOUNTER — Inpatient Hospital Stay: Payer: PPO | Attending: Oncology

## 2019-02-06 DIAGNOSIS — D508 Other iron deficiency anemias: Secondary | ICD-10-CM

## 2019-02-06 DIAGNOSIS — K922 Gastrointestinal hemorrhage, unspecified: Secondary | ICD-10-CM | POA: Insufficient documentation

## 2019-02-06 DIAGNOSIS — D500 Iron deficiency anemia secondary to blood loss (chronic): Secondary | ICD-10-CM | POA: Diagnosis not present

## 2019-02-06 LAB — CBC WITH DIFFERENTIAL (CANCER CENTER ONLY)
Abs Immature Granulocytes: 0.02 10*3/uL (ref 0.00–0.07)
Basophils Absolute: 0 10*3/uL (ref 0.0–0.1)
Basophils Relative: 1 %
Eosinophils Absolute: 1.1 10*3/uL — ABNORMAL HIGH (ref 0.0–0.5)
Eosinophils Relative: 16 %
HCT: 40.5 % (ref 36.0–46.0)
Hemoglobin: 13 g/dL (ref 12.0–15.0)
Immature Granulocytes: 0 %
Lymphocytes Relative: 27 %
Lymphs Abs: 1.9 10*3/uL (ref 0.7–4.0)
MCH: 32.9 pg (ref 26.0–34.0)
MCHC: 32.1 g/dL (ref 30.0–36.0)
MCV: 102.5 fL — ABNORMAL HIGH (ref 80.0–100.0)
Monocytes Absolute: 0.8 10*3/uL (ref 0.1–1.0)
Monocytes Relative: 11 %
Neutro Abs: 3.1 10*3/uL (ref 1.7–7.7)
Neutrophils Relative %: 45 %
Platelet Count: 262 10*3/uL (ref 150–400)
RBC: 3.95 MIL/uL (ref 3.87–5.11)
RDW: 13.6 % (ref 11.5–15.5)
WBC Count: 6.9 10*3/uL (ref 4.0–10.5)
nRBC: 0 % (ref 0.0–0.2)

## 2019-02-06 LAB — FERRITIN: Ferritin: 82 ng/mL (ref 11–307)

## 2019-02-09 ENCOUNTER — Telehealth: Payer: Self-pay | Admitting: *Deleted

## 2019-02-09 NOTE — Telephone Encounter (Signed)
Provided results of CBC and Ferritin--stable per Dr. Benay Spice. F/U as scheduled in October. She reports starting to feel a little more fatigue. If this worsens she will call back.

## 2019-02-22 DIAGNOSIS — I5031 Acute diastolic (congestive) heart failure: Secondary | ICD-10-CM | POA: Diagnosis not present

## 2019-02-22 DIAGNOSIS — J9601 Acute respiratory failure with hypoxia: Secondary | ICD-10-CM | POA: Diagnosis not present

## 2019-02-22 DIAGNOSIS — I509 Heart failure, unspecified: Secondary | ICD-10-CM | POA: Diagnosis not present

## 2019-02-27 DIAGNOSIS — I509 Heart failure, unspecified: Secondary | ICD-10-CM | POA: Diagnosis not present

## 2019-02-27 DIAGNOSIS — J9601 Acute respiratory failure with hypoxia: Secondary | ICD-10-CM | POA: Diagnosis not present

## 2019-02-27 DIAGNOSIS — J449 Chronic obstructive pulmonary disease, unspecified: Secondary | ICD-10-CM | POA: Diagnosis not present

## 2019-02-27 DIAGNOSIS — I5031 Acute diastolic (congestive) heart failure: Secondary | ICD-10-CM | POA: Diagnosis not present

## 2019-03-14 ENCOUNTER — Other Ambulatory Visit: Payer: Self-pay | Admitting: Physician Assistant

## 2019-03-14 DIAGNOSIS — E034 Atrophy of thyroid (acquired): Secondary | ICD-10-CM

## 2019-03-25 DIAGNOSIS — I5031 Acute diastolic (congestive) heart failure: Secondary | ICD-10-CM | POA: Diagnosis not present

## 2019-03-25 DIAGNOSIS — I509 Heart failure, unspecified: Secondary | ICD-10-CM | POA: Diagnosis not present

## 2019-03-25 DIAGNOSIS — J9601 Acute respiratory failure with hypoxia: Secondary | ICD-10-CM | POA: Diagnosis not present

## 2019-03-30 DIAGNOSIS — I5031 Acute diastolic (congestive) heart failure: Secondary | ICD-10-CM | POA: Diagnosis not present

## 2019-03-30 DIAGNOSIS — J449 Chronic obstructive pulmonary disease, unspecified: Secondary | ICD-10-CM | POA: Diagnosis not present

## 2019-03-30 DIAGNOSIS — I509 Heart failure, unspecified: Secondary | ICD-10-CM | POA: Diagnosis not present

## 2019-03-30 DIAGNOSIS — J9601 Acute respiratory failure with hypoxia: Secondary | ICD-10-CM | POA: Diagnosis not present

## 2019-04-25 DIAGNOSIS — J9601 Acute respiratory failure with hypoxia: Secondary | ICD-10-CM | POA: Diagnosis not present

## 2019-04-25 DIAGNOSIS — I5031 Acute diastolic (congestive) heart failure: Secondary | ICD-10-CM | POA: Diagnosis not present

## 2019-04-25 DIAGNOSIS — I509 Heart failure, unspecified: Secondary | ICD-10-CM | POA: Diagnosis not present

## 2019-04-30 DIAGNOSIS — J9601 Acute respiratory failure with hypoxia: Secondary | ICD-10-CM | POA: Diagnosis not present

## 2019-04-30 DIAGNOSIS — I5031 Acute diastolic (congestive) heart failure: Secondary | ICD-10-CM | POA: Diagnosis not present

## 2019-04-30 DIAGNOSIS — I509 Heart failure, unspecified: Secondary | ICD-10-CM | POA: Diagnosis not present

## 2019-04-30 DIAGNOSIS — J449 Chronic obstructive pulmonary disease, unspecified: Secondary | ICD-10-CM | POA: Diagnosis not present

## 2019-05-14 ENCOUNTER — Other Ambulatory Visit: Payer: Self-pay | Admitting: Physician Assistant

## 2019-05-14 DIAGNOSIS — K219 Gastro-esophageal reflux disease without esophagitis: Secondary | ICD-10-CM

## 2019-05-25 DIAGNOSIS — I509 Heart failure, unspecified: Secondary | ICD-10-CM | POA: Diagnosis not present

## 2019-05-25 DIAGNOSIS — I5031 Acute diastolic (congestive) heart failure: Secondary | ICD-10-CM | POA: Diagnosis not present

## 2019-05-25 DIAGNOSIS — J9601 Acute respiratory failure with hypoxia: Secondary | ICD-10-CM | POA: Diagnosis not present

## 2019-05-30 DIAGNOSIS — I509 Heart failure, unspecified: Secondary | ICD-10-CM | POA: Diagnosis not present

## 2019-05-30 DIAGNOSIS — I5031 Acute diastolic (congestive) heart failure: Secondary | ICD-10-CM | POA: Diagnosis not present

## 2019-05-30 DIAGNOSIS — J449 Chronic obstructive pulmonary disease, unspecified: Secondary | ICD-10-CM | POA: Diagnosis not present

## 2019-05-30 DIAGNOSIS — J9601 Acute respiratory failure with hypoxia: Secondary | ICD-10-CM | POA: Diagnosis not present

## 2019-06-01 ENCOUNTER — Inpatient Hospital Stay: Payer: PPO

## 2019-06-01 ENCOUNTER — Other Ambulatory Visit: Payer: Self-pay

## 2019-06-01 ENCOUNTER — Telehealth: Payer: Self-pay | Admitting: Oncology

## 2019-06-01 ENCOUNTER — Inpatient Hospital Stay: Payer: PPO | Attending: Oncology | Admitting: Oncology

## 2019-06-01 VITALS — BP 128/77 | HR 85 | Temp 98.5°F | Resp 16 | Ht 65.0 in | Wt 102.5 lb

## 2019-06-01 DIAGNOSIS — Z79899 Other long term (current) drug therapy: Secondary | ICD-10-CM | POA: Diagnosis not present

## 2019-06-01 DIAGNOSIS — R1310 Dysphagia, unspecified: Secondary | ICD-10-CM | POA: Diagnosis not present

## 2019-06-01 DIAGNOSIS — D508 Other iron deficiency anemias: Secondary | ICD-10-CM | POA: Diagnosis not present

## 2019-06-01 DIAGNOSIS — D500 Iron deficiency anemia secondary to blood loss (chronic): Secondary | ICD-10-CM | POA: Insufficient documentation

## 2019-06-01 DIAGNOSIS — E2740 Unspecified adrenocortical insufficiency: Secondary | ICD-10-CM | POA: Diagnosis not present

## 2019-06-01 DIAGNOSIS — Z96642 Presence of left artificial hip joint: Secondary | ICD-10-CM | POA: Insufficient documentation

## 2019-06-01 DIAGNOSIS — Z87442 Personal history of urinary calculi: Secondary | ICD-10-CM | POA: Insufficient documentation

## 2019-06-01 LAB — CBC WITH DIFFERENTIAL (CANCER CENTER ONLY)
Abs Immature Granulocytes: 0 10*3/uL (ref 0.00–0.07)
Basophils Absolute: 0 10*3/uL (ref 0.0–0.1)
Basophils Relative: 1 %
Eosinophils Absolute: 0.3 10*3/uL (ref 0.0–0.5)
Eosinophils Relative: 6 %
HCT: 40.2 % (ref 36.0–46.0)
Hemoglobin: 12.9 g/dL (ref 12.0–15.0)
Immature Granulocytes: 0 %
Lymphocytes Relative: 32 %
Lymphs Abs: 1.8 10*3/uL (ref 0.7–4.0)
MCH: 33.1 pg (ref 26.0–34.0)
MCHC: 32.1 g/dL (ref 30.0–36.0)
MCV: 103.1 fL — ABNORMAL HIGH (ref 80.0–100.0)
Monocytes Absolute: 0.6 10*3/uL (ref 0.1–1.0)
Monocytes Relative: 11 %
Neutro Abs: 2.8 10*3/uL (ref 1.7–7.7)
Neutrophils Relative %: 50 %
Platelet Count: 253 10*3/uL (ref 150–400)
RBC: 3.9 MIL/uL (ref 3.87–5.11)
RDW: 12.8 % (ref 11.5–15.5)
WBC Count: 5.6 10*3/uL (ref 4.0–10.5)
nRBC: 0 % (ref 0.0–0.2)

## 2019-06-01 LAB — FERRITIN: Ferritin: 54 ng/mL (ref 11–307)

## 2019-06-01 NOTE — Telephone Encounter (Signed)
Scheduled per 10/29 los, patient received calender.

## 2019-06-01 NOTE — Progress Notes (Signed)
  [facility] OFFICE PROGRESS NOTE   Diagnosis: Iron deficiency anemia  INTERVAL HISTORY:   Ms. Henthorne returns as scheduled.  She feels well.  She last received IV iron in November 2019.  She had nausea after the iron infusion.  She otherwise tolerated the iron well.  No bleeding.  She underwent an upper endoscopy Dr. Watt Climes in June for dilation of a stricture at the gastrojejunostomy.  No dysphagia at present.  Objective:  Vital signs in last 24 hours:  Blood pressure 128/77, pulse 85, temperature 98.5 F (36.9 C), temperature source Temporal, resp. rate 16, height 5\' 5"  (1.651 m), weight 102 lb 8 oz (46.5 kg), SpO2 99 %.     GI: No hepatosplenomegaly, nontender, no mass Vascular: No leg edema     Lab Results:  Lab Results  Component Value Date   WBC 5.6 06/01/2019   HGB 12.9 06/01/2019   HCT 40.2 06/01/2019   MCV 103.1 (H) 06/01/2019   PLT 253 06/01/2019   NEUTROABS 2.8 06/01/2019    CMP  Lab Results  Component Value Date   NA 141 01/06/2019   K 4.5 01/06/2019   CL 109 (H) 01/06/2019   CO2 18 (L) 01/06/2019   GLUCOSE 98 01/06/2019   BUN 26 01/06/2019   CREATININE 0.74 01/06/2019   CALCIUM 9.5 01/06/2019   PROT 5.6 (L) 01/06/2019   ALBUMIN 3.8 01/06/2019   AST 18 01/06/2019   ALT 20 01/06/2019   ALKPHOS 61 01/06/2019   BILITOT <0.2 01/06/2019   GFRNONAA 84 01/06/2019   GFRAA 96 01/06/2019    Ferritin-54 Medications: I have reviewed the patient's current medications.   Assessment/Plan:  1. Chronic anemia secondary to gastrointestinal blood loss and iron deficiency, she last received IV iron 06/10/2018 2. History of kidney stones, followed by Dr. Roni Bread. 3. Gram-negative sepsis and septic shock syndrome April 2008. 4. History of acute renal failure secondary to obstruction and septic shock. 5. History of adrenal insufficiency, no longer on hormone replacement.  6. Questionable allergic to Venofer in June 2010. She reported an elevated blood  pressure and "dizziness" on the day following the Venofer therapy. She has tolerated iron dextran without an apparent reaction. 7. History of a left forearm fracture.  8. Intermittent low-serum bicarbonate level, ? related to diarrhea or renal insufficiency 9. Dysphagia, improved with erythromycin and esophageal dilatation procedures , status post dilatation of the gastric jejunal anastomosis 08/10/2014,02/15/2015, 11/12/2015, 08/10/2016, 05/20/2017 10. Chest x-ray 05/21/2017-COPD changes with questionable nodular density inferior left chest. CT chest scheduled 06/07/2017. 11. Right hip arthroplasty May 2019, left hip arthroplasty July 2019     Disposition: Ms. Townson appears well.  The hemoglobin is normal, but the ferritin is drifting down.  She would like to proceed with another dose of IV iron.  She last received IV iron 1 year ago.  She will be scheduled for IV iron within the next few weeks.  She will also receive an influenza vaccine.  I recommended she obtain a valent pneumococcal vaccine via her primary provider. She will return for a lab visit in 4 months and 8 months.  She will be scheduled for a 1 year office visit.  Betsy Coder, MD  06/01/2019  10:57 AM

## 2019-06-12 ENCOUNTER — Inpatient Hospital Stay: Payer: PPO | Attending: Oncology

## 2019-06-12 ENCOUNTER — Other Ambulatory Visit: Payer: Self-pay

## 2019-06-12 VITALS — BP 113/62 | HR 78 | Temp 98.4°F | Resp 18

## 2019-06-12 DIAGNOSIS — D500 Iron deficiency anemia secondary to blood loss (chronic): Secondary | ICD-10-CM | POA: Diagnosis not present

## 2019-06-12 DIAGNOSIS — Z79899 Other long term (current) drug therapy: Secondary | ICD-10-CM | POA: Diagnosis not present

## 2019-06-12 MED ORDER — SODIUM CHLORIDE 0.9 % IV SOLN
1500.0000 mg | Freq: Once | INTRAVENOUS | Status: AC
  Administered 2019-06-12: 1500 mg via INTRAVENOUS
  Filled 2019-06-12: qty 30

## 2019-06-12 MED ORDER — DIPHENHYDRAMINE HCL 25 MG PO TABS
50.0000 mg | ORAL_TABLET | Freq: Once | ORAL | Status: AC
  Administered 2019-06-12: 50 mg via ORAL
  Filled 2019-06-12: qty 2

## 2019-06-12 MED ORDER — ACETAMINOPHEN 325 MG PO TABS
ORAL_TABLET | ORAL | Status: AC
  Filled 2019-06-12: qty 2

## 2019-06-12 MED ORDER — DIPHENHYDRAMINE HCL 25 MG PO CAPS
ORAL_CAPSULE | ORAL | Status: AC
  Filled 2019-06-12: qty 2

## 2019-06-12 MED ORDER — SODIUM CHLORIDE 0.9 % IV SOLN
25.0000 mg | Freq: Once | INTRAVENOUS | Status: AC
  Administered 2019-06-12: 25 mg via INTRAVENOUS
  Filled 2019-06-12: qty 0.5

## 2019-06-12 MED ORDER — SODIUM CHLORIDE 0.9 % IV SOLN
Freq: Once | INTRAVENOUS | Status: AC
  Administered 2019-06-12: 09:00:00 via INTRAVENOUS
  Filled 2019-06-12: qty 250

## 2019-06-12 MED ORDER — ACETAMINOPHEN 325 MG PO TABS
650.0000 mg | ORAL_TABLET | Freq: Once | ORAL | Status: AC
  Administered 2019-06-12: 650 mg via ORAL

## 2019-06-12 MED ORDER — ONDANSETRON HCL 8 MG PO TABS
8.0000 mg | ORAL_TABLET | Freq: Once | ORAL | Status: AC
  Administered 2019-06-12: 8 mg via ORAL

## 2019-06-12 MED ORDER — ONDANSETRON HCL 8 MG PO TABS
ORAL_TABLET | ORAL | Status: AC
  Filled 2019-06-12: qty 1

## 2019-06-12 NOTE — Progress Notes (Signed)
Pt tolerated iron test dose w/o difficulty per Harrel Lemon, RN.  Kennith Center, Pharm.D., CPP 06/12/2019@9 :30 AM

## 2019-06-12 NOTE — Patient Instructions (Signed)
Iron Dextran injection (Infed) What is this medicine? IRON DEXTRAN (AHY ern DEX tran) is an iron complex. Iron is used to make healthy red blood cells, which carry oxygen and nutrients through the body. This medicine is used to treat people who cannot take iron by mouth and have low levels of iron in the blood. This medicine may be used for other purposes; ask your health care provider or pharmacist if you have questions. COMMON BRAND NAME(S): Dexferrum, INFeD What should I tell my health care provider before I take this medicine? They need to know if you have any of these conditions: -anemia not caused by low iron levels -heart disease -high levels of iron in the blood -kidney disease -liver disease -an unusual or allergic reaction to iron, other medicines, foods, dyes, or preservatives -pregnant or trying to get pregnant -breast-feeding How should I use this medicine? This medicine is for injection into a vein or a muscle. It is given by a health care professional in a hospital or clinic setting. Talk to your pediatrician regarding the use of this medicine in children. While this drug may be prescribed for children as young as 49 months old for selected conditions, precautions do apply. Overdosage: If you think you have taken too much of this medicine contact a poison control center or emergency room at once. NOTE: This medicine is only for you. Do not share this medicine with others. What if I miss a dose? It is important not to miss your dose. Call your doctor or health care professional if you are unable to keep an appointment. What may interact with this medicine? Do not take this medicine with any of the following medications: -deferoxamine -dimercaprol -other iron products This medicine may also interact with the following medications: -chloramphenicol -deferasirox This list may not describe all possible interactions. Give your health care provider a list of all the medicines,  herbs, non-prescription drugs, or dietary supplements you use. Also tell them if you smoke, drink alcohol, or use illegal drugs. Some items may interact with your medicine. What should I watch for while using this medicine? Visit your doctor or health care professional regularly. Tell your doctor if your symptoms do not start to get better or if they get worse. You may need blood work done while you are taking this medicine. You may need to follow a special diet. Talk to your doctor. Foods that contain iron include: whole grains/cereals, dried fruits, beans, or peas, leafy green vegetables, and organ meats (liver, kidney). Long-term use of this medicine may increase your risk of some cancers. Talk to your doctor about how to limit your risk. What side effects may I notice from receiving this medicine? Side effects that you should report to your doctor or health care professional as soon as possible: -allergic reactions like skin rash, itching or hives, swelling of the face, lips, or tongue -blue lips, nails, or skin -breathing problems -changes in blood pressure -chest pain -confusion -fast, irregular heartbeat -feeling faint or lightheaded, falls -fever or chills -flushing, sweating, or hot feelings -joint or muscle aches or pains -pain, tingling, numbness in the hands or feet -seizures -unusually weak or tired Side effects that usually do not require medical attention (report to your doctor or health care professional if they continue or are bothersome): -change in taste (metallic taste) -diarrhea -headache -irritation at site where injected -nausea, vomiting -stomach upset This list may not describe all possible side effects. Call your doctor for medical advice about side  effects. You may report side effects to FDA at 1-800-FDA-1088. Where should I keep my medicine? This drug is given in a hospital or clinic and will not be stored at home. NOTE: This sheet is a summary. It may not  cover all possible information. If you have questions about this medicine, talk to your doctor, pharmacist, or health care provider.  2018 Elsevier/Gold Standard (2007-12-06 16:59:50)

## 2019-06-13 ENCOUNTER — Other Ambulatory Visit: Payer: Self-pay | Admitting: Physician Assistant

## 2019-06-13 DIAGNOSIS — F411 Generalized anxiety disorder: Secondary | ICD-10-CM

## 2019-06-25 DIAGNOSIS — I509 Heart failure, unspecified: Secondary | ICD-10-CM | POA: Diagnosis not present

## 2019-06-25 DIAGNOSIS — J9601 Acute respiratory failure with hypoxia: Secondary | ICD-10-CM | POA: Diagnosis not present

## 2019-06-25 DIAGNOSIS — I5031 Acute diastolic (congestive) heart failure: Secondary | ICD-10-CM | POA: Diagnosis not present

## 2019-07-05 ENCOUNTER — Encounter: Payer: Self-pay | Admitting: Physician Assistant

## 2019-07-05 ENCOUNTER — Ambulatory Visit (INDEPENDENT_AMBULATORY_CARE_PROVIDER_SITE_OTHER): Payer: PPO | Admitting: Physician Assistant

## 2019-07-05 DIAGNOSIS — F411 Generalized anxiety disorder: Secondary | ICD-10-CM

## 2019-07-05 DIAGNOSIS — K219 Gastro-esophageal reflux disease without esophagitis: Secondary | ICD-10-CM | POA: Diagnosis not present

## 2019-07-05 DIAGNOSIS — E034 Atrophy of thyroid (acquired): Secondary | ICD-10-CM

## 2019-07-05 DIAGNOSIS — D500 Iron deficiency anemia secondary to blood loss (chronic): Secondary | ICD-10-CM | POA: Diagnosis not present

## 2019-07-05 MED ORDER — SUCRALFATE 1 G PO TABS: 1.0000 g | ORAL_TABLET | Freq: Three times a day (TID) | ORAL | 5 refills | Status: DC

## 2019-07-05 MED ORDER — OMEPRAZOLE 40 MG PO CPDR: DELAYED_RELEASE_CAPSULE | ORAL | 3 refills | Status: DC

## 2019-07-05 MED ORDER — ONDANSETRON 4 MG PO TBDP: 4.0000 mg | ORAL_TABLET | Freq: Three times a day (TID) | ORAL | 2 refills | Status: DC | PRN

## 2019-07-05 MED ORDER — LORATADINE 10 MG PO TABS: 10.0000 mg | ORAL_TABLET | Freq: Every day | ORAL | 3 refills | Status: DC

## 2019-07-05 MED ORDER — SERTRALINE HCL 100 MG PO TABS: 200.0000 mg | ORAL_TABLET | Freq: Every day | ORAL | 1 refills | Status: DC

## 2019-07-05 MED ORDER — ALPRAZOLAM 0.5 MG PO TABS: 0.5000 mg | ORAL_TABLET | Freq: Four times a day (QID) | ORAL | 5 refills | Status: DC | PRN

## 2019-07-05 MED ORDER — LEVOTHYROXINE SODIUM 100 MCG PO TABS: 100.0000 ug | ORAL_TABLET | Freq: Every morning | ORAL | 3 refills | Status: DC

## 2019-07-06 ENCOUNTER — Other Ambulatory Visit: Payer: Self-pay | Admitting: Gastroenterology

## 2019-07-07 ENCOUNTER — Encounter: Payer: Self-pay | Admitting: Physician Assistant

## 2019-07-07 NOTE — Progress Notes (Signed)
Telephone visit  Subjective: [id number] chronic conditions PCP: Terald Sleeper, PA-C [id number] Brittany Hensley is a 68 y.o. female calls for telephone consult today. Patient provides verbal consent for consult held via phone.  Patient is identified with 2 separate identifiers.  At this time the entire area is on COVID-19 social distancing and stay home orders are in place.  Patient is of higher risk and therefore we are performing this by a virtual method.  Location of patient: home Location of provider: HOME Others present for call: no  We are having a follow-up for the patient's chronic medical conditions which do include GERD, hypothyroidism, generalized anxiety, history of iron deficiency anemia she will be having an upcoming scanned and scope performed.  She is still under the care of gastroenterology.  She states that she has gained weight and has been doing a lot better.  Much better than a year ago.  Due to Covid restrictions we have had her stay out of the office because of her frail health.  We will try to have her in in coming weeks when it is safer.  I will also have labs performed.  An order has been placed.  ANXIETY ASSESSMENT Cause of anxiety: GAD This patient returns for a  month recheck on narcotic use for the above named condition(s)  Current medications-alprazolam 0.5 mg 1 up to 4 times a day for anxiety Zoloft 200 mg daily Other medications tried: SSRIs Medication side effects- none Any concerns- no Any change in general medical condition- no Effectiveness of current meds- good PMP AWARE website reviewed: Yes Any suspicious activity on PMP Aware: No LME daily dose: 4  Contract on file Last UDS  ordered   History of overdose or risk of abuse no   ROS: Per HPI  Allergies  Allergen Reactions  . Augmentin [Amoxicillin-Pot Clavulanate] Other (See Comments)    Bloody stool Did it involve swelling of the face/tongue/throat, SOB, or low BP? No Did it involve  sudden or severe rash/hives, skin peeling, or any reaction on the inside of your mouth or nose? No Did you need to seek medical attention at a hospital or doctor's office? No When did it last happen?1 year If all above answers are "NO", may proceed with cephalosporin use.   . Famotidine Other (See Comments)    Fever   . Klonopin [Clonazepam] Other (See Comments)    double vision  . Voltaren [Diclofenac Sodium]     Topical made nervous  . Nitrofurantoin Rash  . Reglan [Metoclopramide] Anxiety and Other (See Comments)    Causes confusion  . Sulfamethoxazole-Trimethoprim Rash   Past Medical History:  Diagnosis Date  . Anemia    iron   . Anxiety   . Diverticulitis    Hx: of  . GERD (gastroesophageal reflux disease)    at times  . History of kidney stones    10 years ago  . Hypothyroidism   . Low iron   . Pneumonia   . Rosacea    Hx: of    Current Outpatient Medications:  .  acetaminophen (TYLENOL) 500 MG tablet, Take 500 mg by mouth 2 (two) times daily. , Disp: , Rfl:  .  ALPRAZolam (XANAX) 0.5 MG tablet, Take 1 tablet (0.5 mg total) by mouth 4 (four) times daily as needed for anxiety., Disp: 120 tablet, Rfl: 5 .  Ascorbic Acid (VITAMIN C) 1000 MG tablet, Take 1,000 mg by mouth daily., Disp: , Rfl:  .  b complex  vitamins capsule, Take 1 capsule by mouth daily., Disp: , Rfl:  .  Biotin 10 MG CAPS, Take 10 mg by mouth daily., Disp: , Rfl:  .  Calcium Carbonate (CALCIUM 600 PO), Take 1,200 mg by mouth 2 (two) times daily. , Disp: , Rfl:  .  Cholecalciferol (VITAMIN D) 2000 units tablet, Take 2,000 Units by mouth 2 (two) times daily., Disp: , Rfl:  .  docusate sodium (COLACE) 100 MG capsule, Take 1 capsule (100 mg total) by mouth 2 (two) times daily., Disp: 10 capsule, Rfl: 0 .  Flaxseed, Linseed, (FLAXSEED OIL) 1200 MG CAPS, Take 1,200 mg by mouth daily. , Disp: , Rfl:  .  folic acid (FOLVITE) [id number] MCG tablet, Take 800 mcg by mouth daily., Disp: , Rfl:  .  levothyroxine  (SYNTHROID) 100 MCG tablet, Take 1 tablet (100 mcg total) by mouth every morning. (Patient taking differently: Take 100 mcg by mouth daily. ), Disp: 90 tablet, Rfl: 3 .  loratadine (CLARITIN) 10 MG tablet, Take 1 tablet (10 mg total) by mouth daily., Disp: 90 tablet, Rfl: 3 .  Multiple Vitamins-Minerals (CENTRUM SILVER) CHEW, Chew 2 tablets by mouth 2 (two) times daily. , Disp: , Rfl:  .  omeprazole (PRILOSEC) 40 MG capsule, TAKE ONE (1) CAPSULE EACH DAY (Patient taking differently: Take 40 mg by mouth daily. ), Disp: 90 capsule, Rfl: 3 .  ondansetron (ZOFRAN-ODT) 4 MG disintegrating tablet, Take 1 tablet (4 mg total) by mouth every 8 (eight) hours as needed for nausea or vomiting., Disp: 40 tablet, Rfl: 2 .  Probiotic Product (PROBIOTIC PO), Take 1 capsule by mouth daily with lunch. 1 billion, Disp: , Rfl:  .  sertraline (ZOLOFT) 100 MG tablet, Take 2 tablets (200 mg total) by mouth daily. (Patient taking differently: Take 200 mg by mouth at bedtime. ), Disp: 180 tablet, Rfl: 1 .  sucralfate (CARAFATE) 1 g tablet, Take 1 tablet (1 g total) by mouth 3 (three) times daily with meals., Disp: 90 tablet, Rfl: 5 .  Turmeric Curcumin 500 MG CAPS, Take 500 mg by mouth 2 (two) times a day., Disp: , Rfl:   Assessment/ Plan: 68 y.o. female   1. GAD (generalized anxiety disorder) - ALPRAZolam (XANAX) 0.5 MG tablet; Take 1 tablet (0.5 mg total) by mouth 4 (four) times daily as needed for anxiety.  Dispense: 120 tablet; Refill: 5 - sertraline (ZOLOFT) 100 MG tablet; Take 2 tablets (200 mg total) by mouth daily. (Patient taking differently: Take 200 mg by mouth at bedtime. )  Dispense: 180 tablet; Refill: 1 - DRUG SCREEN-TOXASSURE; Future  2. Gastroesophageal reflux disease without esophagitis - omeprazole (PRILOSEC) 40 MG capsule; TAKE ONE (1) CAPSULE EACH DAY (Patient taking differently: Take 40 mg by mouth daily. )  Dispense: 90 capsule; Refill: 3  3. Iron deficiency anemia due to chronic blood loss -  sucralfate (CARAFATE) 1 g tablet; Take 1 tablet (1 g total) by mouth 3 (three) times daily with meals.  Dispense: 90 tablet; Refill: 5  4. Hypothyroidism due to acquired atrophy of thyroid - levothyroxine (SYNTHROID) 100 MCG tablet; Take 1 tablet (100 mcg total) by mouth every morning. (Patient taking differently: Take 100 mcg by mouth daily. )  Dispense: 90 tablet; Refill: 3   Return in about 6 months (around 01/03/2020) for recheck medications.  Continue all other maintenance medications as listed above.  Start time: 1: 77 PM End time: 2:15 PM  Meds ordered this encounter  Medications  . ALPRAZolam (XANAX) 0.5 MG  tablet    Sig: Take 1 tablet (0.5 mg total) by mouth 4 (four) times daily as needed for anxiety.    Dispense:  120 tablet    Refill:  5    Order Specific Question:   Supervising Provider    Answer:   Janora Norlander [id number]  . omeprazole (PRILOSEC) 40 MG capsule    Sig: TAKE ONE (1) CAPSULE EACH DAY    Dispense:  90 capsule    Refill:  3    Order Specific Question:   Supervising Provider    Answer:   Janora Norlander [id number]  . loratadine (CLARITIN) 10 MG tablet    Sig: Take 1 tablet (10 mg total) by mouth daily.    Dispense:  90 tablet    Refill:  3    Order Specific Question:   Supervising Provider    Answer:   Janora Norlander [id number]  . sertraline (ZOLOFT) 100 MG tablet    Sig: Take 2 tablets (200 mg total) by mouth daily.    Dispense:  180 tablet    Refill:  1    Order Specific Question:   Supervising Provider    Answer:   Janora Norlander [id number]  . sucralfate (CARAFATE) 1 g tablet    Sig: Take 1 tablet (1 g total) by mouth 3 (three) times daily with meals.    Dispense:  90 tablet    Refill:  5    Order Specific Question:   Supervising Provider    Answer:   Janora Norlander [id number]  . levothyroxine (SYNTHROID) 100 MCG tablet    Sig: Take 1 tablet (100 mcg total) by mouth every morning.    Dispense:  90 tablet    Refill:  3     Order Specific Question:   Supervising Provider    Answer:   Janora Norlander [id number]  . ondansetron (ZOFRAN-ODT) 4 MG disintegrating tablet    Sig: Take 1 tablet (4 mg total) by mouth every 8 (eight) hours as needed for nausea or vomiting.    Dispense:  40 tablet    Refill:  2    Order Specific Question:   Supervising Provider    Answer:   Janora Norlander [id number]    Particia Nearing PA-C [place] [phone]

## 2019-07-10 ENCOUNTER — Other Ambulatory Visit: Payer: Self-pay

## 2019-07-10 ENCOUNTER — Other Ambulatory Visit (HOSPITAL_COMMUNITY)
Admission: RE | Admit: 2019-07-10 | Discharge: 2019-07-10 | Disposition: A | Discharge status: Home / Self Care | Payer: PPO | Source: Ambulatory Visit | Attending: Gastroenterology | Admitting: Gastroenterology

## 2019-07-10 DIAGNOSIS — Z01812 Encounter for preprocedural laboratory examination: Secondary | ICD-10-CM | POA: Insufficient documentation

## 2019-07-10 DIAGNOSIS — Z20828 Contact with and (suspected) exposure to other viral communicable diseases: Secondary | ICD-10-CM | POA: Diagnosis not present

## 2019-07-10 LAB — SARS CORONAVIRUS 2 (TAT 6-24 HRS): SARS Coronavirus 2: NEGATIVE

## 2019-07-11 ENCOUNTER — Encounter (HOSPITAL_COMMUNITY): Payer: Self-pay | Admitting: *Deleted

## 2019-07-11 ENCOUNTER — Other Ambulatory Visit: Payer: Self-pay

## 2019-07-13 ENCOUNTER — Encounter (HOSPITAL_COMMUNITY): Payer: Self-pay | Admitting: Gastroenterology

## 2019-07-13 ENCOUNTER — Ambulatory Visit (HOSPITAL_COMMUNITY): Payer: PPO | Admitting: Certified Registered Nurse Anesthetist

## 2019-07-13 ENCOUNTER — Ambulatory Visit (HOSPITAL_COMMUNITY)
Admission: RE | Admit: 2019-07-13 | Discharge: 2019-07-13 | Disposition: A | Discharge status: Home / Self Care | Payer: PPO | Attending: Gastroenterology | Admitting: Gastroenterology

## 2019-07-13 ENCOUNTER — Other Ambulatory Visit: Payer: Self-pay

## 2019-07-13 ENCOUNTER — Encounter (HOSPITAL_COMMUNITY)
Admission: RE | Disposition: A | Discharge status: Home / Self Care | Payer: Self-pay | Source: Home / Self Care | Attending: Gastroenterology

## 2019-07-13 DIAGNOSIS — Z79890 Hormone replacement therapy (postmenopausal): Secondary | ICD-10-CM | POA: Diagnosis not present

## 2019-07-13 DIAGNOSIS — D509 Iron deficiency anemia, unspecified: Secondary | ICD-10-CM | POA: Diagnosis not present

## 2019-07-13 DIAGNOSIS — F419 Anxiety disorder, unspecified: Secondary | ICD-10-CM | POA: Insufficient documentation

## 2019-07-13 DIAGNOSIS — D500 Iron deficiency anemia secondary to blood loss (chronic): Secondary | ICD-10-CM | POA: Diagnosis not present

## 2019-07-13 DIAGNOSIS — K9189 Other postprocedural complications and disorders of digestive system: Secondary | ICD-10-CM | POA: Insufficient documentation

## 2019-07-13 DIAGNOSIS — K449 Diaphragmatic hernia without obstruction or gangrene: Secondary | ICD-10-CM | POA: Insufficient documentation

## 2019-07-13 DIAGNOSIS — Z79899 Other long term (current) drug therapy: Secondary | ICD-10-CM | POA: Diagnosis not present

## 2019-07-13 DIAGNOSIS — K311 Adult hypertrophic pyloric stenosis: Secondary | ICD-10-CM | POA: Diagnosis not present

## 2019-07-13 DIAGNOSIS — E039 Hypothyroidism, unspecified: Secondary | ICD-10-CM | POA: Diagnosis not present

## 2019-07-13 DIAGNOSIS — K219 Gastro-esophageal reflux disease without esophagitis: Secondary | ICD-10-CM | POA: Diagnosis not present

## 2019-07-13 DIAGNOSIS — Z96643 Presence of artificial hip joint, bilateral: Secondary | ICD-10-CM | POA: Insufficient documentation

## 2019-07-13 DIAGNOSIS — Z980 Intestinal bypass and anastomosis status: Secondary | ICD-10-CM | POA: Insufficient documentation

## 2019-07-13 DIAGNOSIS — R1311 Dysphagia, oral phase: Secondary | ICD-10-CM | POA: Diagnosis present

## 2019-07-13 HISTORY — PX: BALLOON DILATION: SHX5330

## 2019-07-13 HISTORY — PX: ESOPHAGOGASTRODUODENOSCOPY (EGD) WITH PROPOFOL: SHX5813

## 2019-07-13 SURGERY — ESOPHAGOGASTRODUODENOSCOPY (EGD) WITH PROPOFOL
Anesthesia: Monitor Anesthesia Care

## 2019-07-13 MED ORDER — LIDOCAINE HCL (CARDIAC) PF 100 MG/5ML IV SOSY
PREFILLED_SYRINGE | INTRAVENOUS | Status: DC | PRN
  Administered 2019-07-13: 80 mg via INTRAVENOUS

## 2019-07-13 MED ORDER — ONDANSETRON HCL 4 MG/2ML IJ SOLN
INTRAMUSCULAR | Status: DC | PRN
  Administered 2019-07-13: 4 mg via INTRAVENOUS

## 2019-07-13 MED ORDER — PROPOFOL 10 MG/ML IV BOLUS
INTRAVENOUS | Status: DC | PRN
  Administered 2019-07-13: 40 mg via INTRAVENOUS

## 2019-07-13 MED ORDER — PROPOFOL 500 MG/50ML IV EMUL
INTRAVENOUS | Status: DC | PRN
  Administered 2019-07-13: 150 ug/kg/min via INTRAVENOUS

## 2019-07-13 MED ORDER — SODIUM CHLORIDE 0.9 % IV SOLN: INTRAVENOUS | Status: DC

## 2019-07-13 MED ORDER — LACTATED RINGERS IV SOLN
INTRAVENOUS | Status: DC
  Administered 2019-07-13: 09:00:00 1000 mL via INTRAVENOUS

## 2019-07-13 SURGICAL SUPPLY — 15 items

## 2019-07-13 NOTE — Anesthesia Preprocedure Evaluation (Signed)
Anesthesia Evaluation  Patient identified by MRN, date of birth, ID band Patient awake    Reviewed: Allergy & Precautions, NPO status , Patient's Chart, lab work & pertinent test results  Airway Mallampati: II  TM Distance: >3 FB Neck ROM: Full    Dental  (+) Teeth Intact, Dental Advisory Given   Pulmonary    breath sounds clear to auscultation       Cardiovascular  Rhythm:Regular Rate:Normal     Neuro/Psych    GI/Hepatic   Endo/Other    Renal/GU      Musculoskeletal   Abdominal   Peds  Hematology   Anesthesia Other Findings   Reproductive/Obstetrics                             Anesthesia Physical Anesthesia Plan  ASA: II  Anesthesia Plan: MAC   Post-op Pain Management:    Induction: Intravenous  PONV Risk Score and Plan: Ondansetron and Propofol infusion  Airway Management Planned: Natural Airway and Nasal Cannula  Additional Equipment:   Intra-op Plan:   Post-operative Plan:   Informed Consent: I have reviewed the patients History and Physical, chart, labs and discussed the procedure including the risks, benefits and alternatives for the proposed anesthesia with the patient or authorized representative who has indicated his/her understanding and acceptance.     Dental advisory given  Plan Discussed with: CRNA and Anesthesiologist  Anesthesia Plan Comments:         Anesthesia Quick Evaluation

## 2019-07-13 NOTE — Op Note (Signed)
[facility] Patient Name: Brittany Hensley Procedure Date: 07/13/2019 MRN: [id number] Attending MD: Clarene Essex , MD Date of Birth: 06-Dec-1950 CSN: [id number] Age: 68 Admit Type: Outpatient Procedure:                Upper GI endoscopy Indications:              For therapy of post-surgical anastomotic stenosis Providers:                Clarene Essex, MD, Cleda Daub, RN, Marguerita Merles,                            Technician Referring MD:              Medicines:                Propofol total dose 250 mg IV, Ondansetron 4 mg IV,                            80 mg IV lidocaine Complications:            No immediate complications. Estimated Blood Loss:     Estimated blood loss: none. Procedure:                Pre-Anesthesia Assessment:                           - Prior to the procedure, a History and Physical                            was performed, and patient medications and                            allergies were reviewed. The patient's tolerance of                            previous anesthesia was also reviewed. The risks                            and benefits of the procedure and the sedation                            options and risks were discussed with the patient.                            All questions were answered, and informed consent                            was obtained. Prior Anticoagulants: The patient has                            taken no previous anticoagulant or antiplatelet                            agents. ASA Grade Assessment: II - A patient with  mild systemic disease. After reviewing the risks                            and benefits, the patient was deemed in                            satisfactory condition to undergo the procedure.                           After obtaining informed consent, the endoscope was                            passed under direct vision. Throughout the                            procedure,  the patient's blood pressure, pulse, and                            oxygen saturations were monitored continuously. The                            [id number] Olympus gastroscope was                            introduced through the mouth, and advanced to the                            jejunum. The upper GI endoscopy was accomplished                            without difficulty. The patient tolerated the                            procedure well. Scope In: Scope Out: Findings:      A tiny hiatal hernia was present.      Evidence of a stenosed Billroth I gastroduodenostomy was found. A       gastric pouch was found containing less than usual food debris. The       gastroduodenal anastomosis was characterized by improved appearance with       less erythema. This was traversed. A TTS dilator was passed through the       scope. Dilation with an 18-19-20 mm balloon dilator was performed to 20       mm. The dilation site was examined and showed mild mucosal disruption.      The examined jejunum was normal.      The exam was otherwise without abnormality. Impression:               - Tiny hiatal hernia.                           -Mildly stenosed Billroth I gastroduodenostomy was                            found, characterized by improved appearance.  Dilated.                           - Normal examined jejunum.                           - The examination was otherwise normal.                           - No specimens collected. Moderate Sedation:      Not Applicable - Patient had care per Anesthesia. Recommendation:           - Patient has a contact number available for                            emergencies. The signs and symptoms of potential                            delayed complications were discussed with the                            patient. Return to normal activities tomorrow.                            Written discharge instructions were provided  to the                            patient.                           - Full liquid diet for 2 hours. Then slowly advance                            as tolerated                           - Continue present medications.                           - Return to GI clinic in 6 months.                           - Telephone GI clinic if symptomatic PRN. Procedure Code(s):        --- Professional ---                           [phone], Esophagogastroduodenoscopy, flexible,                            transoral; with dilation of gastric/duodenal                            stricture(s) (eg, balloon, bougie) Diagnosis Code(s):        --- Professional ---                           K44.9, Diaphragmatic hernia without obstruction or  gangrene                           K91.89, Other postprocedural complications and                            disorders of digestive system CPT copyright 2019 American Medical Association. All rights reserved. The codes documented in this report are preliminary and upon coder review may  be revised to meet current compliance requirements. Clarene Essex, MD 07/13/2019 9:49:48 AM This report has been signed electronically. Number of Addenda: 0

## 2019-07-13 NOTE — Progress Notes (Signed)
Brittany Hensley 9:16 AM  Subjective: Patient doing well overall since we last saw her and she has gained weight and her hemoglobin is okay but she has been having some early satiety and requests another dilation and no new medical problems since we have seen her  Objective: Vital signs stable afebrile exam please see preassessment evaluation  Assessment: Anastomotic stricture  Plan: Okay to proceed with endoscopy with anesthesia assistance and dilation  [facility]  office [phone] After 5PM or if no answer call [phone]

## 2019-07-13 NOTE — Transfer of Care (Signed)
Immediate Anesthesia Transfer of Care Note  Patient: Brittany Hensley  Procedure(s) Performed: ESOPHAGOGASTRODUODENOSCOPY (EGD) WITH PROPOFOL (N/A ) BALLOON DILATION (N/A )  Patient Location: PACU and Endoscopy Unit  Anesthesia Type:MAC  Level of Consciousness: awake, alert  and oriented  Airway & Oxygen Therapy: Patient Spontanous Breathing and Patient connected to face mask oxygen  Post-op Assessment: Report given to RN and Post -op Vital signs reviewed and stable  Post vital signs: Reviewed and stable  Last Vitals:  Vitals Value Taken Time  BP    Temp    Pulse    Resp    SpO2      Last Pain:  Vitals:   07/13/19 0823  TempSrc: Oral  PainSc: 0-No pain         Complications: No apparent anesthesia complications

## 2019-07-13 NOTE — Discharge Instructions (Signed)
Begin with liquids only and slowly advance diet and call if question or problem and follow-up in 6 months if doing well  YOU HAD AN ENDOSCOPIC PROCEDURE TODAY: Refer to the procedure report and other information in the discharge instructions given to you for any specific questions about what was found during the examination. If this information does not answer your questions, please call [facility] office at [phone] to clarify.   YOU SHOULD EXPECT: Some feelings of bloating in the abdomen. Passage of more gas than usual. Walking can help get rid of the air that was put into your GI tract during the procedure and reduce the bloating. If you had a lower endoscopy (such as a colonoscopy or flexible sigmoidoscopy) you may notice spotting of blood in your stool or on the toilet paper. Some abdominal soreness may be present for a day or two, also.  DIET: Your first meal following the procedure should be a light meal and then it is ok to progress to your normal diet. A half-sandwich or bowl of soup is an example of a good first meal. Heavy or fried foods are harder to digest and may make you feel nauseous or bloated. Drink plenty of fluids but you should avoid alcoholic beverages for 24 hours. If you had a esophageal dilation, please see attached instructions for diet.   ACTIVITY: Your care partner should take you home directly after the procedure. You should plan to take it easy, moving slowly for the rest of the day. You can resume normal activity the day after the procedure however YOU SHOULD NOT DRIVE, use power tools, machinery or perform tasks that involve climbing or major physical exertion for 24 hours (because of the sedation medicines used during the test).   SYMPTOMS TO REPORT IMMEDIATELY: A gastroenterologist can be reached at any hour. Please call [phone]  for any of the following symptoms:    Following upper endoscopy (EGD, EUS, ERCP, esophageal dilation) Vomiting of blood or coffee  ground material  New, significant abdominal pain  New, significant chest pain or pain under the shoulder blades  Painful or persistently difficult swallowing  New shortness of breath  Black, tarry-looking or red, bloody stools  FOLLOW UP:  If any biopsies were taken you will be contacted by phone or by letter within the next 1-3 weeks. Call [phone]  if you have not heard about the biopsies in 3 weeks.  Please also call with any specific questions about appointments or follow up tests.

## 2019-07-13 NOTE — Anesthesia Postprocedure Evaluation (Signed)
Anesthesia Post Note  Patient: Brittany Hensley  Procedure(s) Performed: ESOPHAGOGASTRODUODENOSCOPY (EGD) WITH PROPOFOL (N/A ) BALLOON DILATION (N/A )     Patient location during evaluation: Endoscopy Anesthesia Type: MAC Level of consciousness: awake and alert Pain management: pain level controlled Vital Signs Assessment: post-procedure vital signs reviewed and stable Respiratory status: spontaneous breathing, nonlabored ventilation, respiratory function stable and patient connected to nasal cannula oxygen Cardiovascular status: stable and blood pressure returned to baseline Postop Assessment: no apparent nausea or vomiting Anesthetic complications: no    Last Vitals:  Vitals:   07/13/19 1000 07/13/19 1010  BP: (!) 115/59 (!) 108/57  Pulse: 78 78  Resp: 15 13  Temp:    SpO2: 100% 99%    Last Pain:  Vitals:   07/13/19 1010  TempSrc:   PainSc: 0-No pain                 [name]

## 2019-07-14 ENCOUNTER — Encounter: Payer: Self-pay | Admitting: *Deleted

## 2019-07-27 ENCOUNTER — Other Ambulatory Visit: Payer: Self-pay | Admitting: Nurse Practitioner

## 2019-07-27 DIAGNOSIS — L89151 Pressure ulcer of sacral region, stage 1: Secondary | ICD-10-CM

## 2019-08-22 ENCOUNTER — Telehealth: Payer: Self-pay | Admitting: *Deleted

## 2019-08-22 NOTE — Telephone Encounter (Signed)
She is working on scheduling the COVID vaccine and asking if there is any contraindication for this in regards to her multiple allergies? Sent message to [place] to advise on how to research this.

## 2019-09-04 ENCOUNTER — Other Ambulatory Visit: Payer: Self-pay

## 2019-09-04 ENCOUNTER — Ambulatory Visit (INDEPENDENT_AMBULATORY_CARE_PROVIDER_SITE_OTHER): Payer: PPO | Admitting: Physician Assistant

## 2019-09-04 ENCOUNTER — Telehealth: Payer: Self-pay | Admitting: Unknown Physician Specialty

## 2019-09-04 ENCOUNTER — Encounter: Payer: Self-pay | Admitting: Physician Assistant

## 2019-09-04 ENCOUNTER — Telehealth: Payer: Self-pay | Admitting: *Deleted

## 2019-09-04 DIAGNOSIS — R05 Cough: Secondary | ICD-10-CM | POA: Diagnosis not present

## 2019-09-04 DIAGNOSIS — R0981 Nasal congestion: Secondary | ICD-10-CM | POA: Diagnosis not present

## 2019-09-04 DIAGNOSIS — J029 Acute pharyngitis, unspecified: Secondary | ICD-10-CM | POA: Diagnosis not present

## 2019-09-04 DIAGNOSIS — U071 COVID-19: Secondary | ICD-10-CM | POA: Diagnosis not present

## 2019-09-04 MED ORDER — PREDNISONE 10 MG (48) PO TBPK: ORAL_TABLET | ORAL | 0 refills | Status: DC

## 2019-09-04 MED ORDER — ALBUTEROL SULFATE HFA 108 (90 BASE) MCG/ACT IN AERS: 2.0000 | INHALATION_SPRAY | Freq: Four times a day (QID) | RESPIRATORY_TRACT | 2 refills | Status: DC | PRN

## 2019-09-04 NOTE — Telephone Encounter (Addendum)
Reporting she tested positive for COVID today. The physician at [facility] has started her on albuteral MDI and Prednisone. Was told she will need to receive gammaglobulin at COVID infusion clinic. Asking if it is safe for her to have this treatment? Per Dr. Benay Spice: It is appropriate and safe for her to have this treatment. May have increased chance of hypersensitivity reaction and will need close monitoring.

## 2019-09-04 NOTE — Telephone Encounter (Signed)
Called to discuss with patient about Covid symptoms and the use of bamlanivimab, a monoclonal antibody infusion for those with mild to moderate Covid symptoms and at a high risk of hospitalization.  Pt is qualified for this infusion at the [facility] infusion center due to Age > 66   Pt will think about it and call me back.

## 2019-09-04 NOTE — Progress Notes (Signed)
Telephone visit  Subjective: CC: Covid infection PCP: Terald Sleeper, PA-C [id number] Brittany Hensley is a 69 y.o. female calls for telephone consult today. Patient provides verbal consent for consult held via phone.  Patient is identified with 2 separate identifiers.  At this time the entire area is on COVID-19 social distancing and stay home orders are in place.  Patient is of higher risk and therefore we are performing this by a virtual method.  Location of patient: home Location of provider: [facility] Others present for call: no   Patient began having symptoms about 3 days ago on 09/01/2019.  She began with congestion and head fullness.  She does have known COPD and has been in the hospital for pneumonia in the past.  She had a rapid test performed through [facility] in [facility].  It was found to be positive on 09/02/2019.  She is having a phone visit with [place] today in order to help with treatment.  She is definitely patient to call us for monoclonal antibody infusion.  She does have COPD and is greater than the age of 28.  She has known history of pneumonia which is put her in the hospital.  At this time she is not with severe symptoms.  We have given her prednisone and albuterol inhaler.  We are going to have an appointment set up for her as soon as possible.  ROS: Per HPI  Allergies  Allergen Reactions  . Augmentin [Amoxicillin-Pot Clavulanate] Other (See Comments)    Bloody stool Did it involve swelling of the face/tongue/throat, SOB, or low BP? No Did it involve sudden or severe rash/hives, skin peeling, or any reaction on the inside of your mouth or nose? No Did you need to seek medical attention at a hospital or doctor's office? No When did it last happen?1 year If all above answers are "NO", may proceed with cephalosporin use.   . Famotidine Other (See Comments)    Fever   . Klonopin [Clonazepam] Other (See Comments)    double vision  . Voltaren [Diclofenac  Sodium]     Topical made nervous  . Nitrofurantoin Rash  . Reglan [Metoclopramide] Anxiety and Other (See Comments)    Causes confusion  . Sulfamethoxazole-Trimethoprim Rash   Past Medical History:  Diagnosis Date  . Anemia    iron   . Anxiety   . Diverticulitis    Hx: of  . GERD (gastroesophageal reflux disease)    at times  . History of kidney stones    10 years ago  . Hypothyroidism   . Low iron   . Pneumonia   . Rosacea    Hx: of    Current Outpatient Medications:  .  acetaminophen (TYLENOL) 500 MG tablet, Take 500 mg by mouth 2 (two) times daily. , Disp: , Rfl:  .  albuterol (VENTOLIN HFA) 108 (90 Base) MCG/ACT inhaler, Inhale 2 puffs into the lungs every 6 (six) hours as needed for wheezing or shortness of breath., Disp: 18 g, Rfl: 2 .  ALPRAZolam (XANAX) 0.5 MG tablet, Take 1 tablet (0.5 mg total) by mouth 4 (four) times daily as needed for anxiety., Disp: 120 tablet, Rfl: 5 .  Ascorbic Acid (VITAMIN C) 1000 MG tablet, Take 1,000 mg by mouth daily., Disp: , Rfl:  .  b complex vitamins capsule, Take 1 capsule by mouth daily., Disp: , Rfl:  .  Biotin 10 MG CAPS, Take 10 mg by mouth daily., Disp: ,  Rfl:  .  Calcium Carbonate (CALCIUM 600 PO), Take 1,200 mg by mouth 2 (two) times daily. , Disp: , Rfl:  .  Cholecalciferol (VITAMIN D) 2000 units tablet, Take 2,000 Units by mouth 2 (two) times daily., Disp: , Rfl:  .  docusate sodium (COLACE) 100 MG capsule, Take 1 capsule (100 mg total) by mouth 2 (two) times daily., Disp: 10 capsule, Rfl: 0 .  Flaxseed, Linseed, (FLAXSEED OIL) 1200 MG CAPS, Take 1,200 mg by mouth daily. , Disp: , Rfl:  .  folic acid (FOLVITE) Q000111Q MCG tablet, Take 800 mcg by mouth daily., Disp: , Rfl:  .  levothyroxine (SYNTHROID) 100 MCG tablet, Take 1 tablet (100 mcg total) by mouth every morning. (Patient taking differently: Take 100 mcg by mouth daily. ), Disp: 90 tablet, Rfl: 3 .  loratadine (CLARITIN) 10 MG tablet, Take 1 tablet (10 mg total) by mouth  daily., Disp: 90 tablet, Rfl: 3 .  Multiple Vitamins-Minerals (CENTRUM SILVER) CHEW, Chew 2 tablets by mouth 2 (two) times daily. , Disp: , Rfl:  .  omeprazole (PRILOSEC) 40 MG capsule, TAKE ONE (1) CAPSULE EACH DAY (Patient taking differently: Take 40 mg by mouth daily. ), Disp: 90 capsule, Rfl: 3 .  ondansetron (ZOFRAN-ODT) 4 MG disintegrating tablet, Take 1 tablet (4 mg total) by mouth every 8 (eight) hours as needed for nausea or vomiting., Disp: 40 tablet, Rfl: 2 .  predniSONE (STERAPRED UNI-PAK 48 TAB) 10 MG (48) TBPK tablet, Take as directed for 12 days, Disp: 48 tablet, Rfl: 0 .  Probiotic Product (PROBIOTIC PO), Take 1 capsule by mouth daily with lunch. 1 billion, Disp: , Rfl:  .  sertraline (ZOLOFT) 100 MG tablet, Take 2 tablets (200 mg total) by mouth daily. (Patient taking differently: Take 200 mg by mouth at bedtime. ), Disp: 180 tablet, Rfl: 1 .  sucralfate (CARAFATE) 1 g tablet, Take 1 tablet (1 g total) by mouth 3 (three) times daily with meals., Disp: 90 tablet, Rfl: 5 .  Turmeric Curcumin 500 MG CAPS, Take 500 mg by mouth 2 (two) times a day., Disp: , Rfl:   Assessment/ Plan: 69 y.o. female   1. COVID-19 virus infection Phone number [phone] to schedule infusion - PR MONOCLONAL ANTIBODIES'  No follow-ups on file.  Continue all other maintenance medications as listed above.  Start time: 2:12 PM End time: 2:26 PM  Meds ordered this encounter  Medications  . albuterol (VENTOLIN HFA) 108 (90 Base) MCG/ACT inhaler    Sig: Inhale 2 puffs into the lungs every 6 (six) hours as needed for wheezing or shortness of breath.    Dispense:  18 g    Refill:  2    Order Specific Question:   Supervising Provider    Answer:   Janora Norlander [id number]  . predniSONE (STERAPRED UNI-PAK 48 TAB) 10 MG (48) TBPK tablet    Sig: Take as directed for 12 days    Dispense:  48 tablet    Refill:  0    Order Specific Question:   Supervising Provider    Answer:   Janora Norlander  [id number]    Particia Nearing PA-C [facility] [phone]

## 2019-09-05 ENCOUNTER — Telehealth: Payer: Self-pay | Admitting: Physician Assistant

## 2019-09-05 ENCOUNTER — Other Ambulatory Visit: Payer: Self-pay | Admitting: Unknown Physician Specialty

## 2019-09-05 DIAGNOSIS — U071 COVID-19: Secondary | ICD-10-CM

## 2019-09-05 NOTE — Telephone Encounter (Signed)
Yes ok to take half doses of the steroid, and take it all before lunch.

## 2019-09-05 NOTE — Telephone Encounter (Signed)
Patient aware and verbalized understanding. °

## 2019-09-05 NOTE — Progress Notes (Signed)
  I connected by phone with Brittany Hensley on 09/05/2019 at 11:33 AM to discuss the potential use of an new treatment for mild to moderate COVID-19 viral infection in non-hospitalized patients.  This patient is a 69 y.o. female that meets the FDA criteria for Emergency Use Authorization of bamlanivimab or casirivimab\imdevimab.  Has a (+) direct SARS-CoV-2 viral test result  Has mild or moderate COVID-19   Is ? 70 years of age and weighs ? 40 kg  Is NOT hospitalized due to COVID-19  Is NOT requiring oxygen therapy or requiring an increase in baseline oxygen flow rate due to COVID-19  Is within 10 days of symptom onset  Has at least one of the high risk factor(s) for progression to severe COVID-19 and/or hospitalization as defined in EUA.  Specific high risk criteria : >/= 70 yo   I have spoken and communicated the following to the patient or parent/caregiver:  1. FDA has authorized the emergency use of bamlanivimab and casirivimab\imdevimab for the treatment of mild to moderate COVID-19 in adults and pediatric patients with positive results of direct SARS-CoV-2 viral testing who are 41 years of age and older weighing at least 40 kg, and who are at high risk for progressing to severe COVID-19 and/or hospitalization.  2. The significant known and potential risks and benefits of bamlanivimab and casirivimab\imdevimab, and the extent to which such potential risks and benefits are unknown.  3. Information on available alternative treatments and the risks and benefits of those alternatives, including clinical trials.  4. Patients treated with bamlanivimab and casirivimab\imdevimab should continue to self-isolate and use infection control measures (e.g., wear mask, isolate, social distance, avoid sharing personal items, clean and disinfect "high touch" surfaces, and frequent handwashing) according to CDC guidelines.   5. The patient or parent/caregiver has the option to accept or refuse  bamlanivimab or casirivimab\imdevimab .  After reviewing this information with the patient, The patient agreed to proceed with receiving the casirivimab\imdevimab infusion and will be provided a copy of the Fact sheet prior to receiving the infusion.Brittany Hensley 09/05/2019 11:33 AM

## 2019-09-06 ENCOUNTER — Ambulatory Visit (HOSPITAL_COMMUNITY)
Admission: RE | Admit: 2019-09-06 | Discharge: 2019-09-06 | Disposition: A | Discharge status: Home / Self Care | Payer: Medicare Other | Source: Ambulatory Visit | Attending: Pulmonary Disease | Admitting: Pulmonary Disease

## 2019-09-06 DIAGNOSIS — D508 Other iron deficiency anemias: Secondary | ICD-10-CM | POA: Insufficient documentation

## 2019-09-06 DIAGNOSIS — U071 COVID-19: Secondary | ICD-10-CM | POA: Insufficient documentation

## 2019-09-06 DIAGNOSIS — Z23 Encounter for immunization: Secondary | ICD-10-CM | POA: Insufficient documentation

## 2019-09-06 MED ORDER — DIPHENHYDRAMINE HCL 50 MG/ML IJ SOLN: 50.0000 mg | Freq: Once | INTRAMUSCULAR | Status: DC | PRN

## 2019-09-06 MED ORDER — SODIUM CHLORIDE 0.9 % IV SOLN
Freq: Once | INTRAVENOUS | Status: AC
  Filled 2019-09-06: qty 10

## 2019-09-06 MED ORDER — FAMOTIDINE IN NACL 20-0.9 MG/50ML-% IV SOLN: 20.0000 mg | Freq: Once | INTRAVENOUS | Status: DC | PRN

## 2019-09-06 MED ORDER — EPINEPHRINE 0.3 MG/0.3ML IJ SOAJ: 0.3000 mg | Freq: Once | INTRAMUSCULAR | Status: DC | PRN

## 2019-09-06 MED ORDER — ALBUTEROL SULFATE HFA 108 (90 BASE) MCG/ACT IN AERS: 2.0000 | INHALATION_SPRAY | Freq: Once | RESPIRATORY_TRACT | Status: DC | PRN

## 2019-09-06 MED ORDER — METHYLPREDNISOLONE SODIUM SUCC 125 MG IJ SOLR: 125.0000 mg | Freq: Once | INTRAMUSCULAR | Status: DC | PRN

## 2019-09-06 MED ORDER — ONDANSETRON HCL 4 MG PO TABS: 8.0000 mg | ORAL_TABLET | Freq: Once | ORAL | Status: DC

## 2019-09-06 MED ORDER — SODIUM CHLORIDE 0.9 % IV SOLN: INTRAVENOUS | Status: DC | PRN

## 2019-09-06 NOTE — Progress Notes (Signed)
  Diagnosis: COVID-19  Physician: Dr. Joya Gaskins   Procedure: Covid Infusion Clinic Med: casirivimab\imdevimab infusion - Provided patient with casirivimab\imdevimab fact sheet for patients, parents and caregivers prior to infusion.  Complications: No immediate complications noted.  Discharge: Discharged home   Babs Sciara 09/06/2019

## 2019-09-06 NOTE — Discharge Instructions (Signed)

## 2019-09-08 ENCOUNTER — Telehealth: Payer: Self-pay | Admitting: Physician Assistant

## 2019-09-08 ENCOUNTER — Other Ambulatory Visit: Payer: Self-pay | Admitting: Physician Assistant

## 2019-09-08 MED ORDER — MUCINEX 600 MG PO TB12: 1200.0000 mg | ORAL_TABLET | Freq: Two times a day (BID) | ORAL | 2 refills | Status: DC

## 2019-09-08 NOTE — Telephone Encounter (Signed)
Sending guaifenisen

## 2019-09-11 NOTE — Telephone Encounter (Signed)
Patient aware.

## 2019-09-26 ENCOUNTER — Telehealth: Payer: Self-pay | Admitting: Oncology

## 2019-09-26 NOTE — Telephone Encounter (Signed)
Rescheduled per 2/22 sch msg, pt req. Called and spoke with pt, confirmed 3/3 appt

## 2019-09-29 ENCOUNTER — Other Ambulatory Visit: Payer: PPO

## 2019-10-04 ENCOUNTER — Other Ambulatory Visit: Payer: Self-pay

## 2019-10-04 ENCOUNTER — Inpatient Hospital Stay: Payer: PPO | Attending: Oncology

## 2019-10-04 ENCOUNTER — Telehealth: Payer: Self-pay | Admitting: *Deleted

## 2019-10-04 DIAGNOSIS — D500 Iron deficiency anemia secondary to blood loss (chronic): Secondary | ICD-10-CM | POA: Diagnosis not present

## 2019-10-04 DIAGNOSIS — D508 Other iron deficiency anemias: Secondary | ICD-10-CM

## 2019-10-04 LAB — CBC WITH DIFFERENTIAL (CANCER CENTER ONLY)
Abs Immature Granulocytes: 0.02 10*3/uL (ref 0.00–0.07)
Basophils Absolute: 0 10*3/uL (ref 0.0–0.1)
Basophils Relative: 1 %
Eosinophils Absolute: 0.4 10*3/uL (ref 0.0–0.5)
Eosinophils Relative: 7 %
HCT: 41.8 % (ref 36.0–46.0)
Hemoglobin: 13.3 g/dL (ref 12.0–15.0)
Immature Granulocytes: 0 %
Lymphocytes Relative: 23 %
Lymphs Abs: 1.2 10*3/uL (ref 0.7–4.0)
MCH: 33.2 pg (ref 26.0–34.0)
MCHC: 31.8 g/dL (ref 30.0–36.0)
MCV: 104.2 fL — ABNORMAL HIGH (ref 80.0–100.0)
Monocytes Absolute: 0.8 10*3/uL (ref 0.1–1.0)
Monocytes Relative: 16 %
Neutro Abs: 2.9 10*3/uL (ref 1.7–7.7)
Neutrophils Relative %: 53 %
Platelet Count: 261 10*3/uL (ref 150–400)
RBC: 4.01 MIL/uL (ref 3.87–5.11)
RDW: 13.9 % (ref 11.5–15.5)
WBC Count: 5.4 10*3/uL (ref 4.0–10.5)
nRBC: 0 % (ref 0.0–0.2)

## 2019-10-04 LAB — FERRITIN: Ferritin: 213 ng/mL (ref 11–307)

## 2019-10-04 NOTE — Telephone Encounter (Signed)
-----   Message from Ladell Pier, MD sent at 10/04/2019  1:59 PM EST ----- Please call patient, hemoglobin and ferritin are normal, follow-up in June for lab as scheduled

## 2019-10-04 NOTE — Telephone Encounter (Signed)
Notified of normal ferritin and Hgb. F/U in June as scheduled.

## 2019-12-01 ENCOUNTER — Other Ambulatory Visit: Payer: Self-pay

## 2019-12-01 ENCOUNTER — Ambulatory Visit (INDEPENDENT_AMBULATORY_CARE_PROVIDER_SITE_OTHER): Payer: PPO | Admitting: Nurse Practitioner

## 2019-12-01 ENCOUNTER — Encounter: Payer: Self-pay | Admitting: Nurse Practitioner

## 2019-12-01 VITALS — BP 103/57 | HR 92 | Temp 98.0°F | Ht 65.0 in | Wt 105.0 lb

## 2019-12-01 DIAGNOSIS — K912 Postsurgical malabsorption, not elsewhere classified: Secondary | ICD-10-CM | POA: Diagnosis not present

## 2019-12-01 DIAGNOSIS — F411 Generalized anxiety disorder: Secondary | ICD-10-CM

## 2019-12-01 DIAGNOSIS — D500 Iron deficiency anemia secondary to blood loss (chronic): Secondary | ICD-10-CM | POA: Diagnosis not present

## 2019-12-01 DIAGNOSIS — Z903 Acquired absence of stomach [part of]: Secondary | ICD-10-CM | POA: Diagnosis not present

## 2019-12-01 DIAGNOSIS — K219 Gastro-esophageal reflux disease without esophagitis: Secondary | ICD-10-CM | POA: Diagnosis not present

## 2019-12-01 DIAGNOSIS — K311 Adult hypertrophic pyloric stenosis: Secondary | ICD-10-CM

## 2019-12-01 DIAGNOSIS — E034 Atrophy of thyroid (acquired): Secondary | ICD-10-CM

## 2019-12-01 DIAGNOSIS — Z681 Body mass index (BMI) 19 or less, adult: Secondary | ICD-10-CM | POA: Diagnosis not present

## 2019-12-01 MED ORDER — LEVOTHYROXINE SODIUM 100 MCG PO TABS: 100.0000 ug | ORAL_TABLET | Freq: Every morning | ORAL | 1 refills | Status: DC

## 2019-12-01 MED ORDER — OMEPRAZOLE 40 MG PO CPDR: DELAYED_RELEASE_CAPSULE | ORAL | 1 refills | Status: DC

## 2019-12-01 MED ORDER — FOLIC ACID 800 MCG PO TABS: 800.0000 ug | ORAL_TABLET | Freq: Every day | ORAL | 1 refills | Status: DC

## 2019-12-01 MED ORDER — ALPRAZOLAM 0.5 MG PO TABS: 0.5000 mg | ORAL_TABLET | Freq: Four times a day (QID) | ORAL | 5 refills | Status: DC | PRN

## 2019-12-01 MED ORDER — SERTRALINE HCL 100 MG PO TABS: 200.0000 mg | ORAL_TABLET | Freq: Every day | ORAL | 1 refills | Status: DC

## 2019-12-01 NOTE — Progress Notes (Signed)
Subjective:    Patient ID: Brittany Hensley, female    DOB: [birth year], 69 y.o.   MRN: [id number]   Chief Complaint: Medical Management of Chronic Issues    HPI:  1. Gastric outlet obstruction Patient has had to have several gastroscopies and have outlet stretched. She has a very poor appetite and acnnot eat a lot at one time  2. Gastroesophageal reflux disease without esophagitis Is on omeprazole daily and does well well with it.  3. Postgastrectomy malabsorption She has absorption problems and doe snot avoid a lot of her nutrients  4. Hypothyroidism due to acquired atrophy of thyroid No problems that she is aware of. Lab Results  Component Value Date   TSH 2.710 01/06/2019     5. Iron deficiency anemia due to chronic blood loss Takes a daily iron supplement Lab Results  Component Value Date   HGB 13.3 10/04/2019    6. GAD (generalized anxiety disorder) Stays stressed with family issue. Is on xanax QID. Says she gets very anxious if she does not take. GAD 7 : Generalized Anxiety Score 12/01/2019 01/28/2017  Nervous, Anxious, on Edge 1 3  Control/stop worrying 0 3  Worry too much - different things 1 3  Trouble relaxing 0 2  Restless 0 2  Easily annoyed or irritable 0 2  Afraid - awful might happen 0 3  Total GAD 7 Score 2 18  Anxiety Difficulty Not difficult at all Very difficult      7. BMI less than 19,adult Tries to eat as much as she can but has a very poor appetite. Wt Readings from Last 3 Encounters:  12/01/19 105 lb (47.6 kg)  06/01/19 102 lb 8 oz (46.5 kg)  01/23/19 101 lb (45.8 kg)   BMI Readings from Last 3 Encounters:  12/01/19 17.47 kg/m  06/01/19 17.06 kg/m  01/23/19 16.81 kg/m       Outpatient Encounter Medications as of 12/01/2019  Medication Sig  . acetaminophen (TYLENOL) 500 MG tablet Take 500 mg by mouth 2 (two) times daily.   Marland Kitchen albuterol (VENTOLIN HFA) 108 (90 Base) MCG/ACT inhaler Inhale 2 puffs into the lungs every 6 (six)  hours as needed for wheezing or shortness of breath.  . ALPRAZolam (XANAX) 0.5 MG tablet Take 1 tablet (0.5 mg total) by mouth 4 (four) times daily as needed for anxiety.  . Ascorbic Acid (VITAMIN C) 1000 MG tablet Take 1,000 mg by mouth daily.  Marland Kitchen b complex vitamins capsule Take 1 capsule by mouth daily.  . Biotin 10 MG CAPS Take 10 mg by mouth daily.  . Calcium Carbonate (CALCIUM 600 PO) Take 1,200 mg by mouth 2 (two) times daily.   . Cholecalciferol (VITAMIN D) 2000 units tablet Take 2,000 Units by mouth 2 (two) times daily.  Marland Kitchen docusate sodium (COLACE) 100 MG capsule Take 1 capsule (100 mg total) by mouth 2 (two) times daily.  . Flaxseed, Linseed, (FLAXSEED OIL) 1200 MG CAPS Take 1,200 mg by mouth daily.   . folic acid (FOLVITE) 742 MCG tablet Take 800 mcg by mouth daily.  Marland Kitchen levothyroxine (SYNTHROID) 100 MCG tablet Take 1 tablet (100 mcg total) by mouth every morning. (Patient taking differently: Take 100 mcg by mouth daily. )  . loratadine (CLARITIN) 10 MG tablet Take 1 tablet (10 mg total) by mouth daily.  . Multiple Vitamins-Minerals (CENTRUM SILVER) CHEW Chew 2 tablets by mouth 2 (two) times daily.   Marland Kitchen omeprazole (PRILOSEC) 40 MG capsule TAKE ONE (1) CAPSULE EACH  DAY (Patient taking differently: Take 40 mg by mouth daily. )  . ondansetron (ZOFRAN-ODT) 4 MG disintegrating tablet Take 1 tablet (4 mg total) by mouth every 8 (eight) hours as needed for nausea or vomiting.  . Probiotic Product (PROBIOTIC PO) Take 1 capsule by mouth daily with lunch. 1 billion  . sertraline (ZOLOFT) 100 MG tablet Take 2 tablets (200 mg total) by mouth daily. (Patient taking differently: Take 200 mg by mouth at bedtime. )  . sucralfate (CARAFATE) 1 g tablet Take 1 tablet (1 g total) by mouth 3 (three) times daily with meals.  . Turmeric Curcumin 500 MG CAPS Take 500 mg by mouth 2 (two) times a day.     Past Surgical History:  Procedure Laterality Date  . APPENDECTOMY    . BALLOON DILATION  06/16/2011    Procedure: BALLOON DILATION;  Surgeon: Jeryl Columbia, MD;  Location: [facility] ENDOSCOPY;  Service: Endoscopy;  Laterality: N/A;  . BALLOON DILATION N/A 10/03/2013   Procedure: BALLOON DILATION;  Surgeon: Jeryl Columbia, MD;  Location: [facility] ENDOSCOPY;  Service: Endoscopy;  Laterality: N/A;  . BALLOON DILATION N/A 08/10/2014   Procedure: BALLOON DILATION;  Surgeon: Jeryl Columbia, MD;  Location: [facility] ENDOSCOPY;  Service: Endoscopy;  Laterality: N/A;  from 12 - 15 cm dilation completed  . BALLOON DILATION N/A 02/15/2015   Procedure: BALLOON DILATION;  Surgeon: Clarene Essex, MD;  Location: [facility] ENDOSCOPY;  Service: Endoscopy;  Laterality: N/A;  . BALLOON DILATION N/A 11/12/2015   Procedure: BALLOON DILATION;  Surgeon: Clarene Essex, MD;  Location: [facility] ENDOSCOPY;  Service: Endoscopy;  Laterality: N/A;  . BALLOON DILATION N/A 08/10/2016   Procedure: BALLOON DILATION;  Surgeon: Clarene Essex, MD;  Location: [facility] ENDOSCOPY;  Service: Endoscopy;  Laterality: N/A;  . BALLOON DILATION N/A 08/07/2016   Procedure: BALLOON DILATION;  Surgeon: Clarene Essex, MD;  Location[facility]COPY;  Service: Endoscopy;  Laterality: N/A;  . BALLOON DILATION N/A 05/20/2017   Procedure: BALLOON DILATION;  Surgeon: Clarene Essex, MD;  Location: [facility] ENDOSCOPY;  Service: Endoscopy;  Laterality: N/A;  . BALLOON DILATION N/A 12/02/2017   Procedure: BALLOON DILATION;  Surgeon: Clarene Essex, MD;  Location: [place];  Service: Endoscopy;  Laterality: N/A;  . BALLOON DILATION N/A 07/19/2018   Procedure: BALLOON DILATION;  Surgeon: Clarene Essex, MD;  Location: [facility] ENDOSCOPY;  Service: Endoscopy;  Laterality: N/A;  . BALLOON DILATION N/A 01/23/2019   Procedure: BALLOON DILATION;  Surgeon: Clarene Essex, MD;  Location: [facility] ENDOSCOPY;  Service: Endoscopy;  Laterality: N/A;  . BALLOON DILATION N/A 07/13/2019   Procedure: BALLOON DILATION;  Surgeon: Clarene Essex, MD;  Location: [facility] ENDOSCOPY;  Service: Endoscopy;  Laterality: N/A;  . CATARACT EXTRACTION, BILATERAL    . CHOLECYSTECTOMY  OPEN  1979  . COLONOSCOPY     Hx: of  . ESOPHAGOGASTRODUODENOSCOPY  06/16/2011   Procedure: ESOPHAGOGASTRODUODENOSCOPY (EGD);  Surgeon: Jeryl Columbia, MD;  Location: [facility] ENDOSCOPY;  Service: Endoscopy;  Laterality: N/A;  . ESOPHAGOGASTRODUODENOSCOPY N/A 10/03/2013   Procedure: ESOPHAGOGASTRODUODENOSCOPY (EGD);  Surgeon: Jeryl Columbia, MD;  Location: [facility] ENDOSCOPY;  Service: Endoscopy;  Laterality: N/A;  . ESOPHAGOGASTRODUODENOSCOPY N/A 08/10/2014   Procedure: ESOPHAGOGASTRODUODENOSCOPY (EGD);  Surgeon: Jeryl Columbia, MD;  Location: [facility] ENDOSCOPY;  Service: Endoscopy;  Laterality: N/A;  . ESOPHAGOGASTRODUODENOSCOPY N/A 08/10/2016   Procedure: ESOPHAGOGASTRODUODENOSCOPY (EGD);  Surgeon: Clarene Essex, MD;  Location: [facility] ENDOSCOPY;  Service: Endoscopy;  Laterality: N/A;  . ESOPHAGOGASTRODUODENOSCOPY N/A 07/19/2018   Procedure: ESOPHAGOGASTRODUODENOSCOPY (EGD);  Surgeon: Clarene Essex, MD;  Location: [facility] ENDOSCOPY;  Service: Endoscopy;  Laterality: N/A;  . ESOPHAGOGASTRODUODENOSCOPY (EGD) WITH PROPOFOL N/A 02/15/2015   Procedure: ESOPHAGOGASTRODUODENOSCOPY (EGD) WITH PROPOFOL;  Surgeon: Clarene Essex, MD;  Location: [facility] ENDOSCOPY;  Service: Endoscopy;  Laterality: N/A;  . ESOPHAGOGASTRODUODENOSCOPY (EGD) WITH PROPOFOL N/A 11/12/2015   Procedure: ESOPHAGOGASTRODUODENOSCOPY (EGD) WITH PROPOFOL;  Surgeon: Clarene Essex, MD;  Location: [facility] ENDOSCOPY;  Service: Endoscopy;  Laterality: N/A;  . ESOPHAGOGASTRODUODENOSCOPY (EGD) WITH PROPOFOL N/A 08/07/2016   Procedure: ESOPHAGOGASTRODUODENOSCOPY (EGD) WITH PROPOFOL;  Surgeon: Clarene Essex, MD;  Location: [facility] ENDOSCOPY;  Service: Endoscopy;  Laterality: N/A;  . ESOPHAGOGASTRODUODENOSCOPY (EGD) WITH PROPOFOL N/A 05/20/2017   Procedure: ESOPHAGOGASTRODUODENOSCOPY (EGD) WITH PROPOFOL;  Surgeon: Clarene Essex, MD;  Location: [facility] ENDOSCOPY;  Service: Endoscopy;  Laterality: N/A;  . ESOPHAGOGASTRODUODENOSCOPY (EGD) WITH PROPOFOL N/A 12/02/2017   Procedure: ESOPHAGOGASTRODUODENOSCOPY (EGD) WITH  PROPOFOL;  Surgeon: Clarene Essex, MD;  Location: [place];  Service: Endoscopy;  Laterality: N/A;  have c arm available  . ESOPHAGOGASTRODUODENOSCOPY (EGD) WITH PROPOFOL N/A 01/23/2019   Procedure: ESOPHAGOGASTRODUODENOSCOPY (EGD) WITH PROPOFOL;  Surgeon: Clarene Essex, MD;  Location: [facility] ENDOSCOPY;  Service: Endoscopy;  Laterality: N/A;  . ESOPHAGOGASTRODUODENOSCOPY (EGD) WITH PROPOFOL N/A 07/13/2019   Procedure: ESOPHAGOGASTRODUODENOSCOPY (EGD) WITH PROPOFOL;  Surgeon: Clarene Essex, MD;  Location: [facility] ENDOSCOPY;  Service: Endoscopy;  Laterality: N/A;  . FOREIGN BODY REMOVAL  07/19/2018   Procedure: FOREIGN BODY REMOVAL;  Surgeon: Clarene Essex, MD;  Location: [facility] ENDOSCOPY;  Service: Endoscopy;;  . FRACTURE SURGERY     Hx: of left wrist  . GASTRECTOMY  [phone]  . GASTRECTOMY N/A    X3  . LITHOTRIPSY     Hx; of for kidney stones  . SAVORY DILATION N/A 08/10/2014   Procedure: SAVORY DILATION;  Surgeon: Jeryl Columbia, MD;  Location: [facility] ENDOSCOPY;  Service: Endoscopy;  Laterality: N/A;  . TOTAL HIP ARTHROPLASTY Right 12/30/2017   Procedure: RIGHT TOTAL HIP ARTHROPLASTY;  Surgeon: Carole Civil, MD;  Location: [facility]S;  Service: Orthopedics;  Laterality: Right;  . TOTAL HIP ARTHROPLASTY Left 02/17/2018   Procedure: TOTAL HIP ARTHROPLASTY;  Surgeon: Carole Civil, MD;  Location: [facility];  Service: Orthopedics;  Laterality: Left;  . TUBAL LIGATION     1975    Family History  Problem Relation Age of Onset  . Diabetes Mother   . Cancer - Prostate Brother     New complaints: None today  Social history: Lives by herself- son and brother check on her daily  Controlled substance contract: 12/01/19    Review of Systems  Constitutional: Negative for diaphoresis.  Eyes: Negative for pain.  Respiratory: Negative for shortness of breath.   Cardiovascular: Negative for chest pain, palpitations and leg swelling.  Gastrointestinal: Negative for abdominal pain.  Endocrine: Negative for  polydipsia.  Skin: Negative for rash.  Neurological: Negative for dizziness, weakness and headaches.  Hematological: Does not bruise/bleed easily.  All other systems reviewed and are negative.      Objective:   Physical Exam Vitals and nursing note reviewed.  Constitutional:      General: She is not in acute distress.    Appearance: Normal appearance. She is well-developed.  HENT:     Head: Normocephalic.     Nose: Nose normal.  Eyes:     Pupils: Pupils are equal, round, and reactive to light.  Neck:     Vascular: No carotid bruit or JVD.  Cardiovascular:     Rate and Rhythm: Normal rate and regular rhythm.  Heart sounds: Normal heart sounds.  Pulmonary:     Effort: Pulmonary effort is normal. No respiratory distress.     Breath sounds: Normal breath sounds. No wheezing or rales.  Chest:     Chest wall: No tenderness.  Abdominal:     General: Bowel sounds are normal. There is no distension or abdominal bruit.     Palpations: Abdomen is soft. There is no hepatomegaly, splenomegaly, mass or pulsatile mass.     Tenderness: There is no abdominal tenderness.  Musculoskeletal:        General: Normal range of motion.     Cervical back: Normal range of motion and neck supple.  Lymphadenopathy:     Cervical: No cervical adenopathy.  Skin:    General: Skin is warm and dry.  Neurological:     Mental Status: She is alert and oriented to person, place, and time.     Deep Tendon Reflexes: Reflexes are normal and symmetric.  Psychiatric:        Behavior: Behavior normal.        Thought Content: Thought content normal.        Judgment: Judgment normal.    BP (!) 103/57   Pulse 92   Temp 98 F (36.7 C) (Temporal)   Ht 5' 5"  (1.651 m)   Wt 105 lb (47.6 kg)   BMI 17.47 kg/m         Assessment & Plan:  Brittany Hensley comes in today with chief complaint of Medical Management of Chronic Issues   Diagnosis and orders addressed:  1. Gastric outlet obstruction Follow  up with GI as needed  2. Gastroesophageal reflux disease without esophagitis Avoid spicy foods Do not eat 2 hours prior to bedtime - omeprazole (PRILOSEC) 40 MG capsule; TAKE ONE (1) CAPSULE EACH DAY  Dispense: 90 capsule; Refill: 1  3. Postgastrectomy malabsorption Eat frequent small meals  4. Hypothyroidism due to acquired atrophy of thyroid - CMP14+EGFR - Lipid panel - Thyroid Panel With TSH - levothyroxine (SYNTHROID) 100 MCG tablet; Take 1 tablet (100 mcg total) by mouth every morning.  Dispense: 90 tablet; Refill: 1  5. Iron deficiency anemia due to chronic blood loss - CBC with Differential/Platelet - folic acid (FOLVITE) 861 MCG tablet; Take 1 tablet (800 mcg total) by mouth daily.  Dispense: 90 tablet; Refill: 1  6. GAD (generalized anxiety disorder) stres management - ToxASSURE Select 13 (MW), Urine - sertraline (ZOLOFT) 100 MG tablet; Take 2 tablets (200 mg total) by mouth daily.  Dispense: 180 tablet; Refill: 1 - ALPRAZolam (XANAX) 0.5 MG tablet; Take 1 tablet (0.5 mg total) by mouth 4 (four) times daily as needed for anxiety.  Dispense: 120 tablet; Refill: 5  7. BMI less than 19,adult Increase calories   Labs pending Health Maintenance reviewed Diet and exercise encouraged  Follow up plan: 6 months   Mary-Margaret Hassell Done, FNP

## 2019-12-01 NOTE — Patient Instructions (Signed)
Protein-Energy Malnutrition Protein-energy malnutrition is when a person does not eat enough protein, fat, and calories. When this happens over time, it can lead to severe loss of muscle tissue (muscle wasting). This condition also affects the body's defense system (immune system) and can lead to other health problems. What are the causes? This condition may be caused by:  Not eating enough protein, fat, or calories.  Having certain chronic medical conditions.  Eating too little. What increases the risk? The following factors may make you more likely to develop this condition:  Living in poverty.  Long-term hospitalization.  Alcohol or drug dependency. Addiction often leads to a lifestyle in which proper diet is ignored. Dependency can also hurt the metabolism and the body's ability to absorb nutrients.  Eating disorders, such as anorexia nervosa or bulimia.  Chewing or swallowing problems. People with these disorders may not eat enough.  Having certain conditions, such as: ? Inflammatory bowel disease. Inflammation of the intestines makes it difficult for the body to absorb nutrients. ? Cancer or AIDS. These diseases can cause a loss of appetite. ? Chronic heart failure. This interferes with how the body uses nutrients. ? Cystic fibrosis. This disease can make it difficult for the body to absorb nutrients.  Eating a diet that extremely restricts protein, fat, or calorie intake. What are the signs or symptoms? Symptoms of this condition include:  Fatigue.  Weakness.  Dizziness.  Fainting.  Weight loss.  Loss of muscle tone and muscle mass.  Poor immune response.  Lack of menstruation.  Poor memory.  Hair loss.  Skin changes. How is this diagnosed? This condition may be diagnosed based on:  Your medical and dietary history.  A physical exam. This may include a measurement of your body mass index (BMI).  Blood tests. How is this treated? This condition may  be managed with:  Nutrition therapy. This may include working with a diet and nutrition specialist (dietitian).  Treatment for underlying conditions. People with severe protein-energy malnutrition may need to be treated in a hospital. This may involve receiving nutrition and fluids through an IV. Follow these instructions at home:   Eat a balanced diet. In each meal, include at least one food that is high in protein. Foods that are high in protein include: ? Meat. ? Poultry. ? Fish. ? Eggs. ? Cheese. ? Milk. ? Beans. ? Nuts.  Eat nutrient-rich foods that are easy to swallow and digest, such as: ? Fruit and yogurt smoothies. ? Oatmeal with nut butter.  Try to eat six small meals each day instead of three large meals.  Take vitamin and protein supplements as told by your health care provider or dietitian.  Follow your health care provider's recommendations about exercise and activity.  Keep all follow-up visits as told by your health care provider. This is important. Contact a health care provider if you:  Have increased weakness or fatigue.  Faint.  Are a woman and you stop having your period (menstruating).  Have rapid hair loss.  Have unexpected weight loss.  Have diarrhea.  Have nausea and vomiting. Get help right away if you have:  Difficulty breathing.  Chest pain. Summary  Protein-energy malnutrition is when a person does not eat enough protein, fat, and calories.  Protein-energy malnutrition can lead to severe loss of muscle tissue (muscle wasting). This condition also affects the body's defense system (immune system) and can lead to other health problems.  Talk with your health care provider about treatment for this   condition. Effective treatment depends on the underlying cause of the malnutrition. This information is not intended to replace advice given to you by your health care provider. Make sure you discuss any questions you have with your health  care provider. Document Revised: 08/04/2017 Document Reviewed: 08/04/2017 Elsevier Patient Education  2020 [employer] Inc[employer]  

## 2019-12-02 LAB — CMP14+EGFR
ALT: 15 IU/L (ref 0–32)
AST: 17 IU/L (ref 0–40)
Albumin/Globulin Ratio: 2 (ref 1.2–2.2)
Albumin: 3.8 g/dL (ref 3.8–4.8)
Alkaline Phosphatase: 54 IU/L (ref 39–117)
BUN/Creatinine Ratio: 21 (ref 12–28)
BUN: 21 mg/dL (ref 8–27)
Bilirubin Total: <0.2 mg/dL (ref 0.0–1.2)
CO2: 15 mmol/L — ABNORMAL LOW (ref 20–29)
Calcium: 9 mg/dL (ref 8.7–10.3)
Chloride: 114 mmol/L — ABNORMAL HIGH (ref 96–106)
Creatinine, Ser: 0.98 mg/dL (ref 0.57–1.00)
GFR calc Af Amer: 68 mL/min/{1.73_m2} (ref >59)
GFR calc non Af Amer: 59 mL/min/{1.73_m2} — ABNORMAL LOW (ref >59)
Globulin, Total: 1.9 g/dL (ref 1.5–4.5)
Glucose: 98 mg/dL (ref 65–99)
Potassium: 4.1 mmol/L (ref 3.5–5.2)
Sodium: 142 mmol/L (ref 134–144)
Total Protein: 5.7 g/dL — ABNORMAL LOW (ref 6.0–8.5)

## 2019-12-02 LAB — THYROID PANEL WITH TSH
Free Thyroxine Index: 1.2 (ref 1.2–4.9)
T3 Uptake Ratio: 28 % (ref 24–39)
T4, Total: 4.4 ug/dL — ABNORMAL LOW (ref 4.5–12.0)
TSH: 4.16 u[IU]/mL (ref 0.450–4.500)

## 2019-12-02 LAB — CBC WITH DIFFERENTIAL/PLATELET
Basophils Absolute: 0 10*3/uL (ref 0.0–0.2)
Basos: 1 %
EOS (ABSOLUTE): 0.3 10*3/uL (ref 0.0–0.4)
Eos: 6 %
Hematocrit: 38.5 % (ref 34.0–46.6)
Hemoglobin: 13 g/dL (ref 11.1–15.9)
Immature Grans (Abs): 0 10*3/uL (ref 0.0–0.1)
Immature Granulocytes: 0 %
Lymphocytes Absolute: 1.7 10*3/uL (ref 0.7–3.1)
Lymphs: 29 %
MCH: 34 pg — ABNORMAL HIGH (ref 26.6–33.0)
MCHC: 33.8 g/dL (ref 31.5–35.7)
MCV: 101 fL — ABNORMAL HIGH (ref 79–97)
Monocytes Absolute: 0.9 10*3/uL (ref 0.1–0.9)
Monocytes: 15 %
Neutrophils Absolute: 3 10*3/uL (ref 1.4–7.0)
Neutrophils: 49 %
Platelets: 252 10*3/uL (ref 150–450)
RBC: 3.82 x10E6/uL (ref 3.77–5.28)
RDW: 11.8 % (ref 11.7–15.4)
WBC: 6 10*3/uL (ref 3.4–10.8)

## 2019-12-02 LAB — LIPID PANEL
Chol/HDL Ratio: 3.4 ratio (ref 0.0–4.4)
Cholesterol, Total: 181 mg/dL (ref 100–199)
HDL: 54 mg/dL (ref >39)
LDL Chol Calc (NIH): 88 mg/dL (ref 0–99)
Triglycerides: 236 mg/dL — ABNORMAL HIGH (ref 0–149)
VLDL Cholesterol Cal: 39 mg/dL (ref 5–40)

## 2019-12-05 LAB — TOXASSURE SELECT 13 (MW), URINE

## 2019-12-06 ENCOUNTER — Telehealth: Payer: Self-pay | Admitting: Nurse Practitioner

## 2019-12-06 NOTE — Telephone Encounter (Signed)
Patient aware of lab results.

## 2019-12-07 ENCOUNTER — Encounter: Payer: Self-pay | Admitting: *Deleted

## 2019-12-11 ENCOUNTER — Other Ambulatory Visit: Payer: Self-pay | Admitting: *Deleted

## 2019-12-11 MED ORDER — ONDANSETRON 4 MG PO TBDP: 4.0000 mg | ORAL_TABLET | Freq: Three times a day (TID) | ORAL | 2 refills | Status: DC | PRN

## 2019-12-27 ENCOUNTER — Other Ambulatory Visit: Payer: Self-pay

## 2019-12-27 ENCOUNTER — Ambulatory Visit: Payer: PPO | Admitting: Orthopedic Surgery

## 2019-12-27 ENCOUNTER — Encounter: Payer: Self-pay | Admitting: Orthopedic Surgery

## 2019-12-27 ENCOUNTER — Ambulatory Visit: Payer: PPO

## 2019-12-27 VITALS — BP 134/72 | HR 92 | Ht 65.0 in | Wt 104.0 lb

## 2019-12-27 DIAGNOSIS — Z96642 Presence of left artificial hip joint: Secondary | ICD-10-CM | POA: Diagnosis not present

## 2019-12-27 DIAGNOSIS — S72001A Fracture of unspecified part of neck of right femur, initial encounter for closed fracture: Secondary | ICD-10-CM

## 2019-12-27 DIAGNOSIS — Z96641 Presence of right artificial hip joint: Secondary | ICD-10-CM | POA: Diagnosis not present

## 2019-12-27 DIAGNOSIS — S72002D Fracture of unspecified part of neck of left femur, subsequent encounter for closed fracture with routine healing: Secondary | ICD-10-CM

## 2019-12-27 DIAGNOSIS — Z87310 Personal history of (healed) osteoporosis fracture: Secondary | ICD-10-CM

## 2019-12-27 NOTE — Progress Notes (Signed)
Chief Complaint  Patient presents with  . Routine Post Op    left hip 02/17/18 pops when walking right hip 12/30/17 okay    Annual follow-up on this 69 year old female who had insufficiency fractures of both hips 2 years ago required bilateral total hips.  She says she gets some popping when she walks on the side of the hip but they are pain free  Exam while she is walking with my hand over her hip she has some crepitance over the greater trochanter.  Otherwise exam was normal  X-rays were normal  This appears to be iliotibial band greater troches friction because of the patient's size.  She is very thin.  Direct lateral approach repair sometimes results in this syndrome with thin patient  She will come back and see [place] if this bothers her or becomes painful  Otherwise follow-up as needed Encounter Diagnoses  Name Primary?  . S/P hip replacement, right 12/30/17 Yes  . Status post total hip replacement, left 02/17/18   . Closed fracture of neck of left femur with routine healing, subsequent encounter   . Closed displaced fracture of right femoral neck ([place])

## 2020-01-10 ENCOUNTER — Other Ambulatory Visit: Payer: Self-pay | Admitting: *Deleted

## 2020-01-10 MED ORDER — ALBUTEROL SULFATE HFA 108 (90 BASE) MCG/ACT IN AERS: 2.0000 | INHALATION_SPRAY | Freq: Four times a day (QID) | RESPIRATORY_TRACT | 2 refills | Status: DC | PRN

## 2020-01-16 ENCOUNTER — Other Ambulatory Visit: Payer: Self-pay | Admitting: Gastroenterology

## 2020-01-17 IMAGING — DX DG HIP (WITH OR WITHOUT PELVIS) 2-3V*L*
3 series · 3 of 3 positions shown · non-contrast
Comparison: 12/30/2017.

CLINICAL DATA: Left hip pain.  Recent right hip surgery.

EXAM:
DG HIP (WITH OR WITHOUT PELVIS) 2-3V LEFT

[pelvis ap]
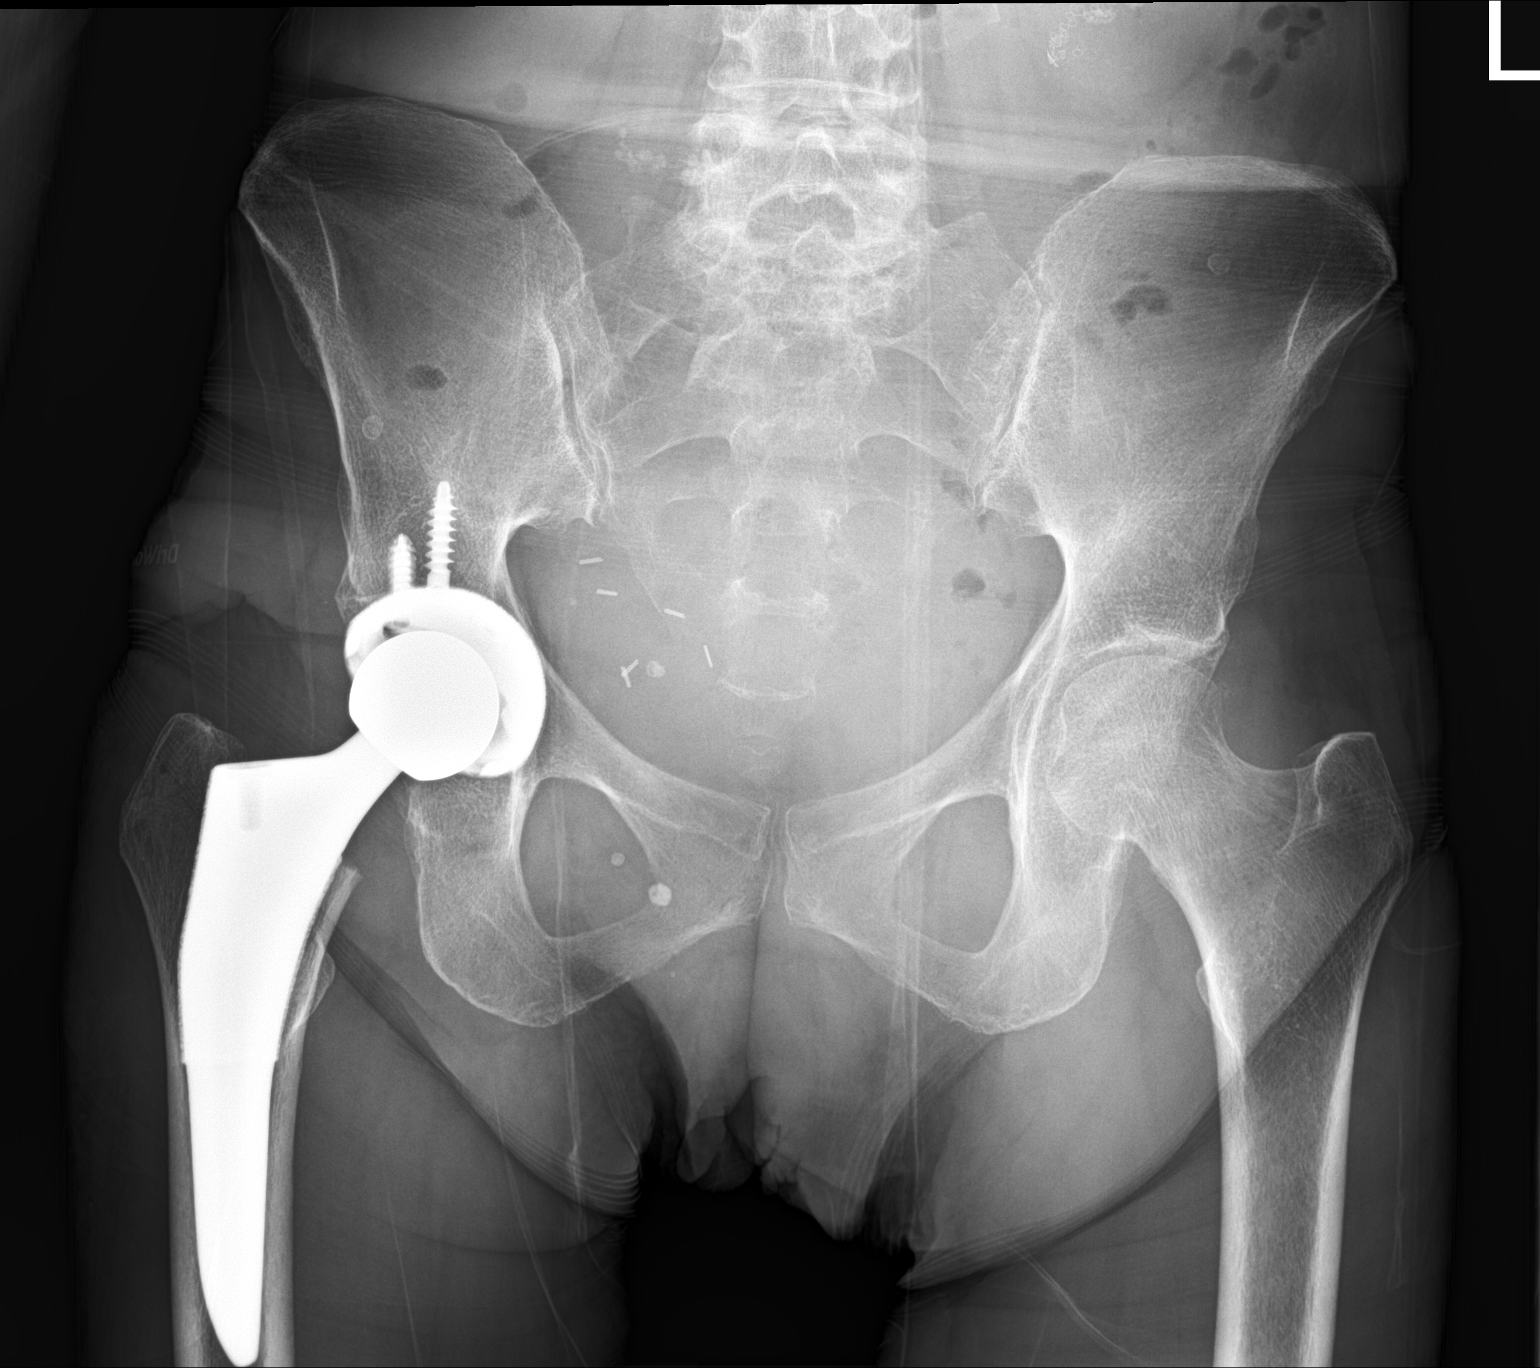

[hip ap]
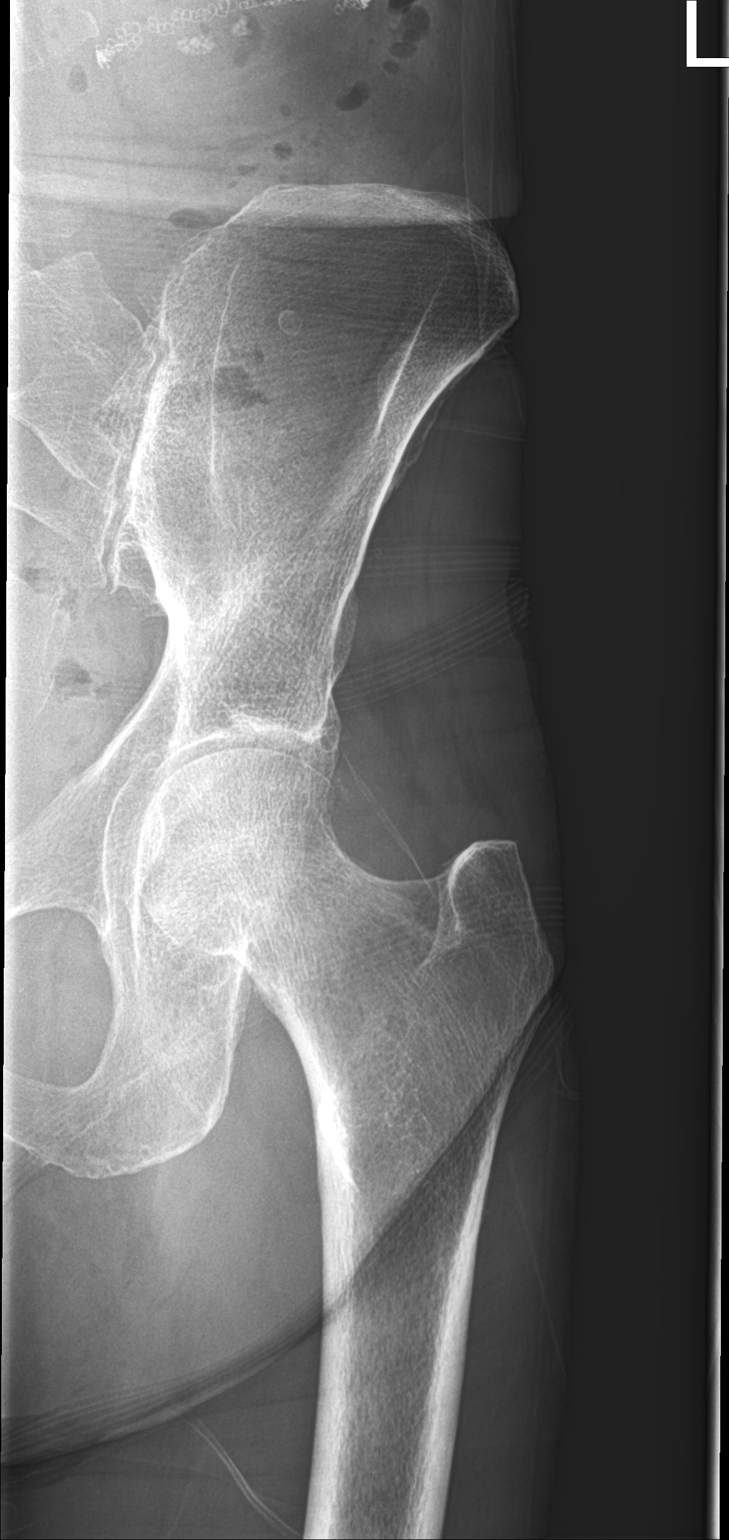

[hip lat]
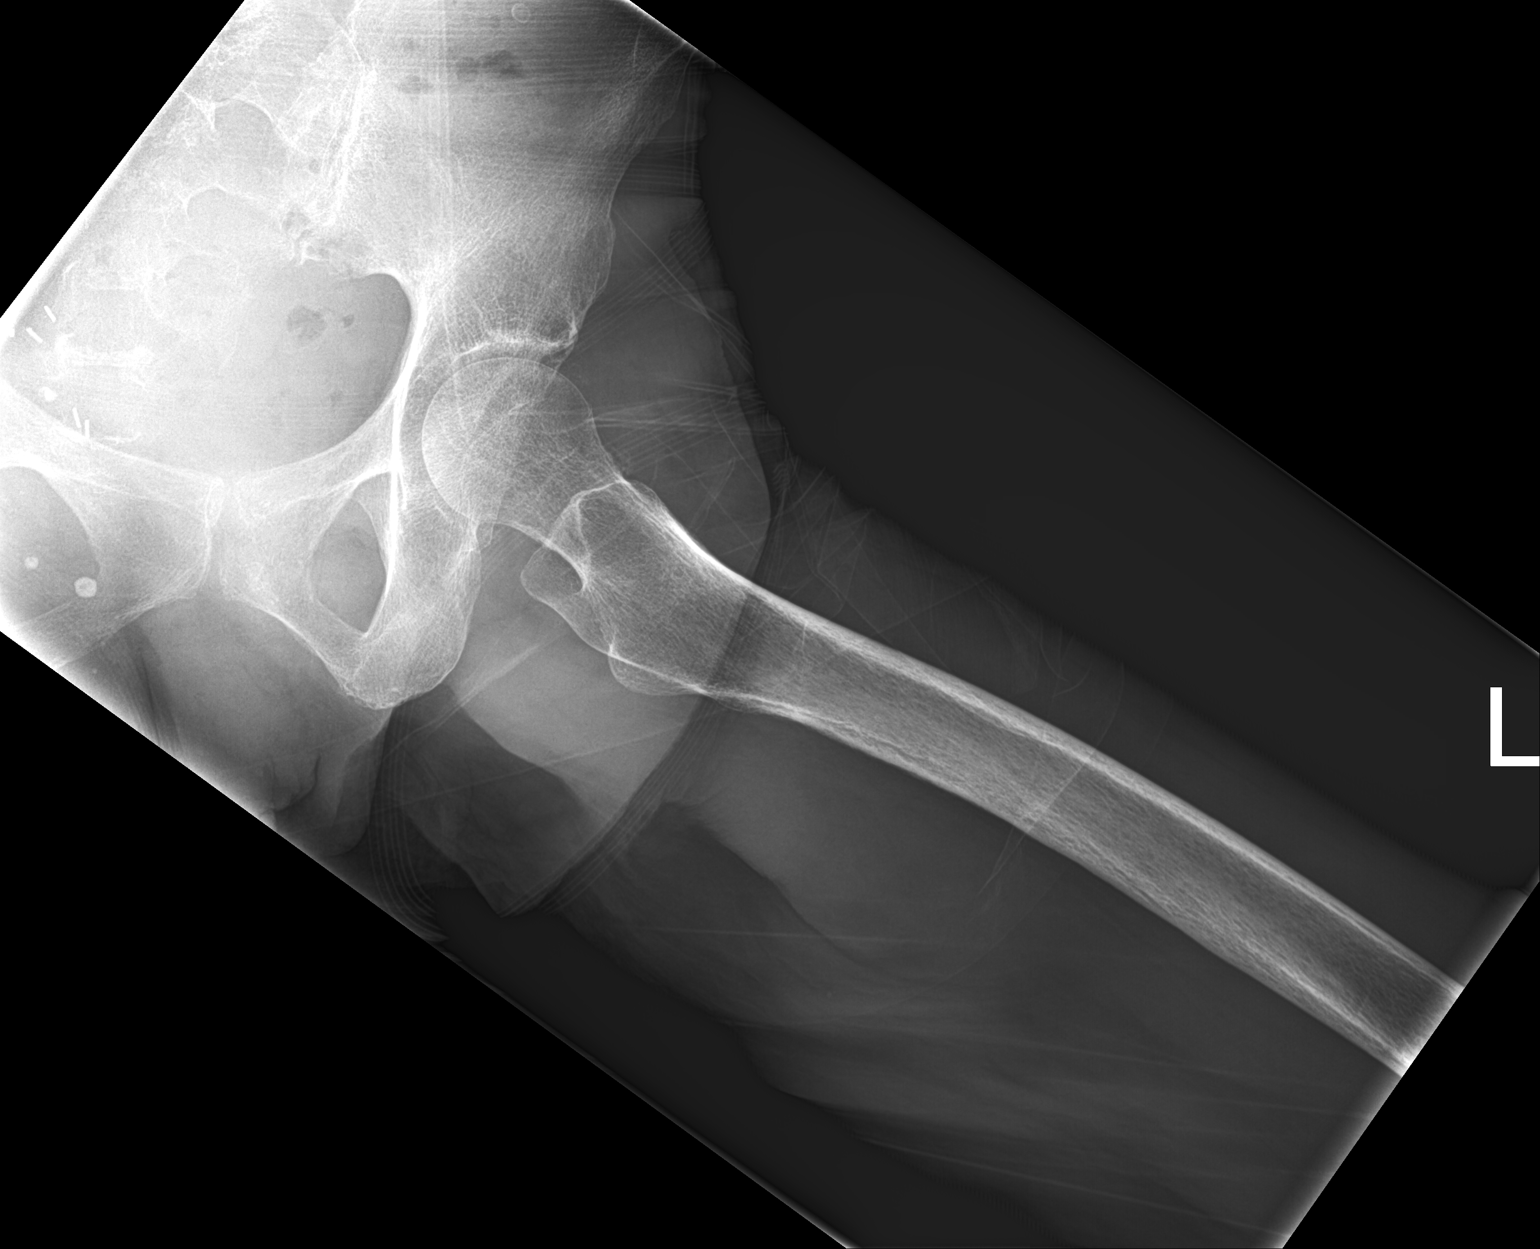

[3 of 3 positions shown; findings below may reference images not displayed]

FINDINGS: Total right hip replacement noted. Left hip is intact. No evidence
of fracture or dislocation. Diffuse degenerative change. Surgical
clips and sutures are noted in the abdomen and pelvis. Pelvic
calcifications noted most consistent phleboliths.
IMPRESSION: 1. No acute left hip abnormality identified. No evidence of fracture
dislocation. Degenerative changes left hip.

2. Total right hip replacement. Hardware intact. Anatomic alignment.

## 2020-01-24 ENCOUNTER — Inpatient Hospital Stay: Payer: PPO | Attending: Oncology

## 2020-01-24 ENCOUNTER — Other Ambulatory Visit: Payer: Self-pay

## 2020-01-24 DIAGNOSIS — D508 Other iron deficiency anemias: Secondary | ICD-10-CM

## 2020-01-24 DIAGNOSIS — D500 Iron deficiency anemia secondary to blood loss (chronic): Secondary | ICD-10-CM | POA: Diagnosis not present

## 2020-01-24 LAB — CBC WITH DIFFERENTIAL (CANCER CENTER ONLY)
Abs Immature Granulocytes: 0.02 10*3/uL (ref 0.00–0.07)
Basophils Absolute: 0 10*3/uL (ref 0.0–0.1)
Basophils Relative: 1 %
Eosinophils Absolute: 0.3 10*3/uL (ref 0.0–0.5)
Eosinophils Relative: 5 %
HCT: 39.4 % (ref 36.0–46.0)
Hemoglobin: 12.7 g/dL (ref 12.0–15.0)
Immature Granulocytes: 0 %
Lymphocytes Relative: 36 %
Lymphs Abs: 2 10*3/uL (ref 0.7–4.0)
MCH: 33.2 pg (ref 26.0–34.0)
MCHC: 32.2 g/dL (ref 30.0–36.0)
MCV: 103.1 fL — ABNORMAL HIGH (ref 80.0–100.0)
Monocytes Absolute: 0.7 10*3/uL (ref 0.1–1.0)
Monocytes Relative: 12 %
Neutro Abs: 2.6 10*3/uL (ref 1.7–7.7)
Neutrophils Relative %: 46 %
Platelet Count: 243 10*3/uL (ref 150–400)
RBC: 3.82 MIL/uL — ABNORMAL LOW (ref 3.87–5.11)
RDW: 13.9 % (ref 11.5–15.5)
WBC Count: 5.5 10*3/uL (ref 4.0–10.5)
nRBC: 0 % (ref 0.0–0.2)

## 2020-01-24 LAB — FERRITIN: Ferritin: 88 ng/mL (ref 11–307)

## 2020-01-26 ENCOUNTER — Telehealth: Payer: Self-pay | Admitting: *Deleted

## 2020-01-26 NOTE — Telephone Encounter (Signed)
Notified of ferritin and hgb results. Notified of next appointment. She had some concern, but will call if she feels as if she is getting lower. Appointment calendar mailed to home per her request.

## 2020-01-26 NOTE — Telephone Encounter (Signed)
-----   Message from Ladell Pier, MD sent at 01/24/2020  8:35 PM EDT ----- Please call patient, hb and ferritin are normal, f/u as scheduled

## 2020-01-30 ENCOUNTER — Other Ambulatory Visit: Payer: PPO

## 2020-01-30 ENCOUNTER — Other Ambulatory Visit: Payer: Self-pay

## 2020-01-30 ENCOUNTER — Other Ambulatory Visit (HOSPITAL_COMMUNITY)
Admission: RE | Admit: 2020-01-30 | Discharge: 2020-01-30 | Disposition: A | Discharge status: Home / Self Care | Payer: PPO | Source: Ambulatory Visit | Attending: Gastroenterology | Admitting: Gastroenterology

## 2020-01-30 DIAGNOSIS — Z20822 Contact with and (suspected) exposure to covid-19: Secondary | ICD-10-CM | POA: Insufficient documentation

## 2020-01-30 DIAGNOSIS — Z01812 Encounter for preprocedural laboratory examination: Secondary | ICD-10-CM | POA: Diagnosis not present

## 2020-01-31 LAB — SARS CORONAVIRUS 2 (TAT 6-24 HRS): SARS Coronavirus 2: NEGATIVE

## 2020-02-02 ENCOUNTER — Encounter (HOSPITAL_COMMUNITY)
Admission: RE | Disposition: A | Discharge status: Home / Self Care | Payer: Self-pay | Source: Home / Self Care | Attending: Gastroenterology

## 2020-02-02 ENCOUNTER — Other Ambulatory Visit: Payer: Self-pay

## 2020-02-02 ENCOUNTER — Ambulatory Visit (HOSPITAL_COMMUNITY): Payer: PPO | Admitting: Certified Registered Nurse Anesthetist

## 2020-02-02 ENCOUNTER — Encounter (HOSPITAL_COMMUNITY): Payer: Self-pay | Admitting: Gastroenterology

## 2020-02-02 ENCOUNTER — Ambulatory Visit (HOSPITAL_COMMUNITY)
Admission: RE | Admit: 2020-02-02 | Discharge: 2020-02-02 | Disposition: A | Discharge status: Home / Self Care | Payer: PPO | Attending: Gastroenterology | Admitting: Gastroenterology

## 2020-02-02 DIAGNOSIS — K2950 Unspecified chronic gastritis without bleeding: Secondary | ICD-10-CM | POA: Diagnosis not present

## 2020-02-02 DIAGNOSIS — K219 Gastro-esophageal reflux disease without esophagitis: Secondary | ICD-10-CM | POA: Insufficient documentation

## 2020-02-02 DIAGNOSIS — Z79890 Hormone replacement therapy (postmenopausal): Secondary | ICD-10-CM | POA: Diagnosis not present

## 2020-02-02 DIAGNOSIS — K9189 Other postprocedural complications and disorders of digestive system: Secondary | ICD-10-CM | POA: Diagnosis not present

## 2020-02-02 DIAGNOSIS — F419 Anxiety disorder, unspecified: Secondary | ICD-10-CM | POA: Insufficient documentation

## 2020-02-02 DIAGNOSIS — Z79899 Other long term (current) drug therapy: Secondary | ICD-10-CM | POA: Insufficient documentation

## 2020-02-02 DIAGNOSIS — Z980 Intestinal bypass and anastomosis status: Secondary | ICD-10-CM | POA: Insufficient documentation

## 2020-02-02 DIAGNOSIS — D638 Anemia in other chronic diseases classified elsewhere: Secondary | ICD-10-CM | POA: Diagnosis not present

## 2020-02-02 DIAGNOSIS — K449 Diaphragmatic hernia without obstruction or gangrene: Secondary | ICD-10-CM | POA: Diagnosis not present

## 2020-02-02 DIAGNOSIS — E039 Hypothyroidism, unspecified: Secondary | ICD-10-CM | POA: Diagnosis not present

## 2020-02-02 HISTORY — PX: BALLOON DILATION: SHX5330

## 2020-02-02 HISTORY — PX: ESOPHAGOGASTRODUODENOSCOPY (EGD) WITH PROPOFOL: SHX5813

## 2020-02-02 SURGERY — ESOPHAGOGASTRODUODENOSCOPY (EGD) WITH PROPOFOL
Anesthesia: Monitor Anesthesia Care

## 2020-02-02 MED ORDER — PROPOFOL 500 MG/50ML IV EMUL
INTRAVENOUS | Status: DC | PRN
  Administered 2020-02-02: 125 ug/kg/min via INTRAVENOUS
  Administered 2020-02-02: 50 mg via INTRAVENOUS

## 2020-02-02 MED ORDER — SODIUM CHLORIDE 0.9 % IV SOLN: INTRAVENOUS | Status: DC

## 2020-02-02 MED ORDER — LACTATED RINGERS IV SOLN: INTRAVENOUS | Status: DC

## 2020-02-02 SURGICAL SUPPLY — 15 items

## 2020-02-02 NOTE — Transfer of Care (Signed)
Immediate Anesthesia Transfer of Care Note  Patient: Brittany Hensley  Procedure(s) Performed: Procedure(s): ESOPHAGOGASTRODUODENOSCOPY (EGD) WITH PROPOFOL (N/A) BALLOON DILATION (N/A)  Patient Location: PACU and Endoscopy Unit  Anesthesia Type:MAC  Level of Consciousness: awake, alert  and oriented  Airway & Oxygen Therapy: Patient Spontanous Breathing and Patient connected to nasal cannula oxygen  Post-op Assessment: Report given to RN and Post -op Vital signs reviewed and stable  Post vital signs: Reviewed and stable  Last Vitals:  Vitals:   02/02/20 1049  BP: 118/65  Pulse: 78  Resp: 18  SpO2: 82%    Complications: No apparent anesthesia complications

## 2020-02-02 NOTE — Progress Notes (Signed)
Brittany Hensley 11:53 AM  Subjective: Patient has been doing well overall and she did have coronavirus in  March and she has not been vomiting but she can tell her digestion is getting sluggish and she requests another balloon dilation she has no other new complaints we answered all of her questions  Objective: Vital signs stable afebrile exam please see preassessment evaluation recent hemoglobin okay  Assessment: Anastomotic stricture  Plan: Okay to proceed with repeat endoscopy with balloon dilation with anesthesia assistance  [facility]  office [phone] After 5PM or if no answer call [phone]

## 2020-02-02 NOTE — Discharge Instructions (Signed)
Begin with clear liquids and if okay slowly advance diet and call if question or problem from a GI standpoint otherwise follow-up in 6 months  YOU HAD AN ENDOSCOPIC PROCEDURE TODAY: Refer to the procedure report and other information in the discharge instructions given to you for any specific questions about what was found during the examination. If this information does not answer your questions, please call [facility] office at [phone] to clarify.   YOU SHOULD EXPECT: Some feelings of bloating in the abdomen. Passage of more gas than usual. Walking can help get rid of the air that was put into your GI tract during the procedure and reduce the bloating. If you had a lower endoscopy (such as a colonoscopy or flexible sigmoidoscopy) you may notice spotting of blood in your stool or on the toilet paper. Some abdominal soreness may be present for a day or two, also.  DIET: Your first meal following the procedure should be a light meal and then it is ok to progress to your normal diet. A half-sandwich or bowl of soup is an example of a good first meal. Heavy or fried foods are harder to digest and may make you feel nauseous or bloated. Drink plenty of fluids but you should avoid alcoholic beverages for 24 hours. If you had a esophageal dilation, please see attached instructions for diet.    ACTIVITY: Your care partner should take you home directly after the procedure. You should plan to take it easy, moving slowly for the rest of the day. You can resume normal activity the day after the procedure however YOU SHOULD NOT DRIVE, use power tools, machinery or perform tasks that involve climbing or major physical exertion for 24 hours (because of the sedation medicines used during the test).   SYMPTOMS TO REPORT IMMEDIATELY: A gastroenterologist can be reached at any hour. Please call [phone]  for any of the following symptoms:  . Following upper endoscopy (EGD, EUS, ERCP, esophageal dilation) Vomiting  of blood or coffee ground material  New, significant abdominal pain  New, significant chest pain or pain under the shoulder blades  Painful or persistently difficult swallowing  New shortness of breath  Black, tarry-looking or red, bloody stools  FOLLOW UP:  If any biopsies were taken you will be contacted by phone or by letter within the next 1-3 weeks. Call [phone]  if you have not heard about the biopsies in 3 weeks.  Please also call with any specific questions about appointments or follow up tests.

## 2020-02-02 NOTE — Op Note (Signed)
[facility] Patient Name: Brittany Hensley Procedure Date: 02/02/2020 MRN: [id number] Attending MD: Clarene Essex , MD Date of Birth: September 06, 1950 CSN: [id number] Age: 69 Admit Type: Outpatient Procedure:                Upper GI endoscopy Indications:              For therapy of post-surgical anastomotic stenosis,                            gastric emptying helped by dilation in the past Providers:                Clarene Essex, MD, Angus Seller, William Dalton,                            Technician, Eliberto Ivory, CRNA Referring MD:              Medicines:                Propofol total dose 163 mg IV Complications:            No immediate complications. Estimated Blood Loss:     Estimated blood loss: none. Procedure:                Pre-Anesthesia Assessment:                           - Prior to the procedure, a History and Physical                            was performed, and patient medications and                            allergies were reviewed. The patient's tolerance of                            previous anesthesia was also reviewed. The risks                            and benefits of the procedure and the sedation                            options and risks were discussed with the patient.                            All questions were answered, and informed consent                            was obtained. Prior Anticoagulants: The patient has                            taken no previous anticoagulant or antiplatelet                            agents. ASA Grade Assessment: II - A patient with  mild systemic disease. After reviewing the risks                            and benefits, the patient was deemed in                            satisfactory condition to undergo the procedure.                           After obtaining informed consent, the endoscope was                            passed under direct vision. Throughout the                             procedure, the patient's blood pressure, pulse, and                            oxygen saturations were monitored continuously. The                            GIF-H190 ([id number]) was introduced through the                            mouth, and advanced to the jejunum. The upper GI                            endoscopy was accomplished without difficulty. The                            patient tolerated the procedure well. Scope In: Scope Out: Findings:      A small hiatal hernia was present.      Diffuse mild inflammation characterized by congestion (edema) and       erythema was found in the entire examined stomach.      A small amount of food (residue) was found in the gastric body.      Evidence of a stenosed Billroth I gastroduodenostomy was found. A       gastric pouch was found. This was traversed. A TTS dilator was passed       through the scope. Dilation with an 18-19-20 mm x 8 cm CRE balloon       dilator was performed to 20 mm. The dilation site was examined and       showed mild mucosal disruption.      The examined jejunum was normal.      The exam was otherwise without abnormality. Impression:               - Small hiatal hernia.                           - Chronic gastritis.                           - A small amount of food (residue) in the stomach.                           -  Stenosed Billroth I gastroduodenostomy was found.                            Dilated.                           - Normal examined jejunum.                           - The examination was otherwise normal.                           - No specimens collected. Moderate Sedation:      Not Applicable - Patient had care per Anesthesia. Recommendation:           - Patient has a contact number available for                            emergencies. The signs and symptoms of potential                            delayed complications were discussed with the                            patient. Return  to normal activities tomorrow.                            Written discharge instructions were provided to the                            patient.                           - Clear liquid diet for 2 hours. Then slowly                            advance as tolerated                           - Continue present medications.                           - Return to GI clinic PRN.                           - Telephone GI clinic if symptomatic PRN. Procedure Code(s):        --- Professional ---                           [id number], Esophagogastroduodenoscopy, flexible,                            transoral; with dilation of gastric/duodenal                            stricture(s) (eg, balloon, bougie) Diagnosis Code(s):        --- Professional ---  K44.9, Diaphragmatic hernia without obstruction or                            gangrene                           K29.50, Unspecified chronic gastritis without                            bleeding                           Z98.0, Intestinal bypass and anastomosis status                           K91.89, Other postprocedural complications and                            disorders of digestive system CPT copyright 2019 American [employer] Association. All rights reserved. The codes documented in this report are preliminary and upon coder review may  be revised to meet current compliance requirements. Clarene Essex, MD 02/02/2020 1:01:20 PM This report has been signed electronically. Number of Addenda: 0

## 2020-02-02 NOTE — Anesthesia Preprocedure Evaluation (Signed)
Anesthesia Evaluation  Patient identified by MRN, date of birth, ID band Patient awake    Reviewed: Allergy & Precautions, NPO status , Patient's Chart, lab work & pertinent test results  Airway Mallampati: I  TM Distance: >3 FB Neck ROM: Full    Dental  (+) Teeth Intact, Dental Advisory Given   Pulmonary    breath sounds clear to auscultation       Cardiovascular negative cardio ROS Normal cardiovascular exam Rhythm:Regular Rate:Normal     Neuro/Psych PSYCHIATRIC DISORDERS Anxiety negative neurological ROS     GI/Hepatic GERD  Medicated and Controlled,  Endo/Other    Renal/GU   negative genitourinary   Musculoskeletal   Abdominal Normal abdominal exam  (+)   Peds  Hematology   Anesthesia Other Findings   Reproductive/Obstetrics                             Anesthesia Physical  Anesthesia Plan  ASA: II  Anesthesia Plan: MAC   Post-op Pain Management:    Induction: Intravenous  PONV Risk Score and Plan: Propofol infusion, TIVA, Ondansetron and Dexamethasone  Airway Management Planned: Natural Airway and Mask  Additional Equipment: None  Intra-op Plan:   Post-operative Plan:   Informed Consent: I have reviewed the patients History and Physical, chart, labs and discussed the procedure including the risks, benefits and alternatives for the proposed anesthesia with the patient or authorized representative who has indicated his/her understanding and acceptance.     Dental advisory given  Plan Discussed with: CRNA  Anesthesia Plan Comments:         Anesthesia Quick Evaluation

## 2020-02-06 ENCOUNTER — Encounter (HOSPITAL_COMMUNITY): Payer: Self-pay | Admitting: Gastroenterology

## 2020-02-06 NOTE — Anesthesia Postprocedure Evaluation (Signed)
Anesthesia Post Note  Patient: Brittany Hensley  Procedure(s) Performed: ESOPHAGOGASTRODUODENOSCOPY (EGD) WITH PROPOFOL (N/A ) BALLOON DILATION (N/A )     Patient location during evaluation: Endoscopy Anesthesia Type: MAC Level of consciousness: awake Pain management: pain level controlled Vital Signs Assessment: post-procedure vital signs reviewed and stable Respiratory status: spontaneous breathing Cardiovascular status: stable Postop Assessment: no apparent nausea or vomiting Anesthetic complications: no   No complications documented.  Last Vitals:  Vitals:   02/02/20 1315 02/02/20 1320  BP: 124/68   Pulse: 88 84  Resp: 18 20  Temp:    SpO2: 98% 100%    Last Pain:  Vitals:   02/02/20 1315  TempSrc:   PainSc: 0-No pain   Pain Goal:                   Huston Foley

## 2020-02-16 IMAGING — CR DG PORTABLE PELVIS
1 series · 1 of 1 positions shown · non-contrast
Comparison: January 18, 2018

CLINICAL DATA: Status post left-sided total-hip replacement

EXAM:
PORTABLE PELVIS 1-2 VIEWS

[ap]
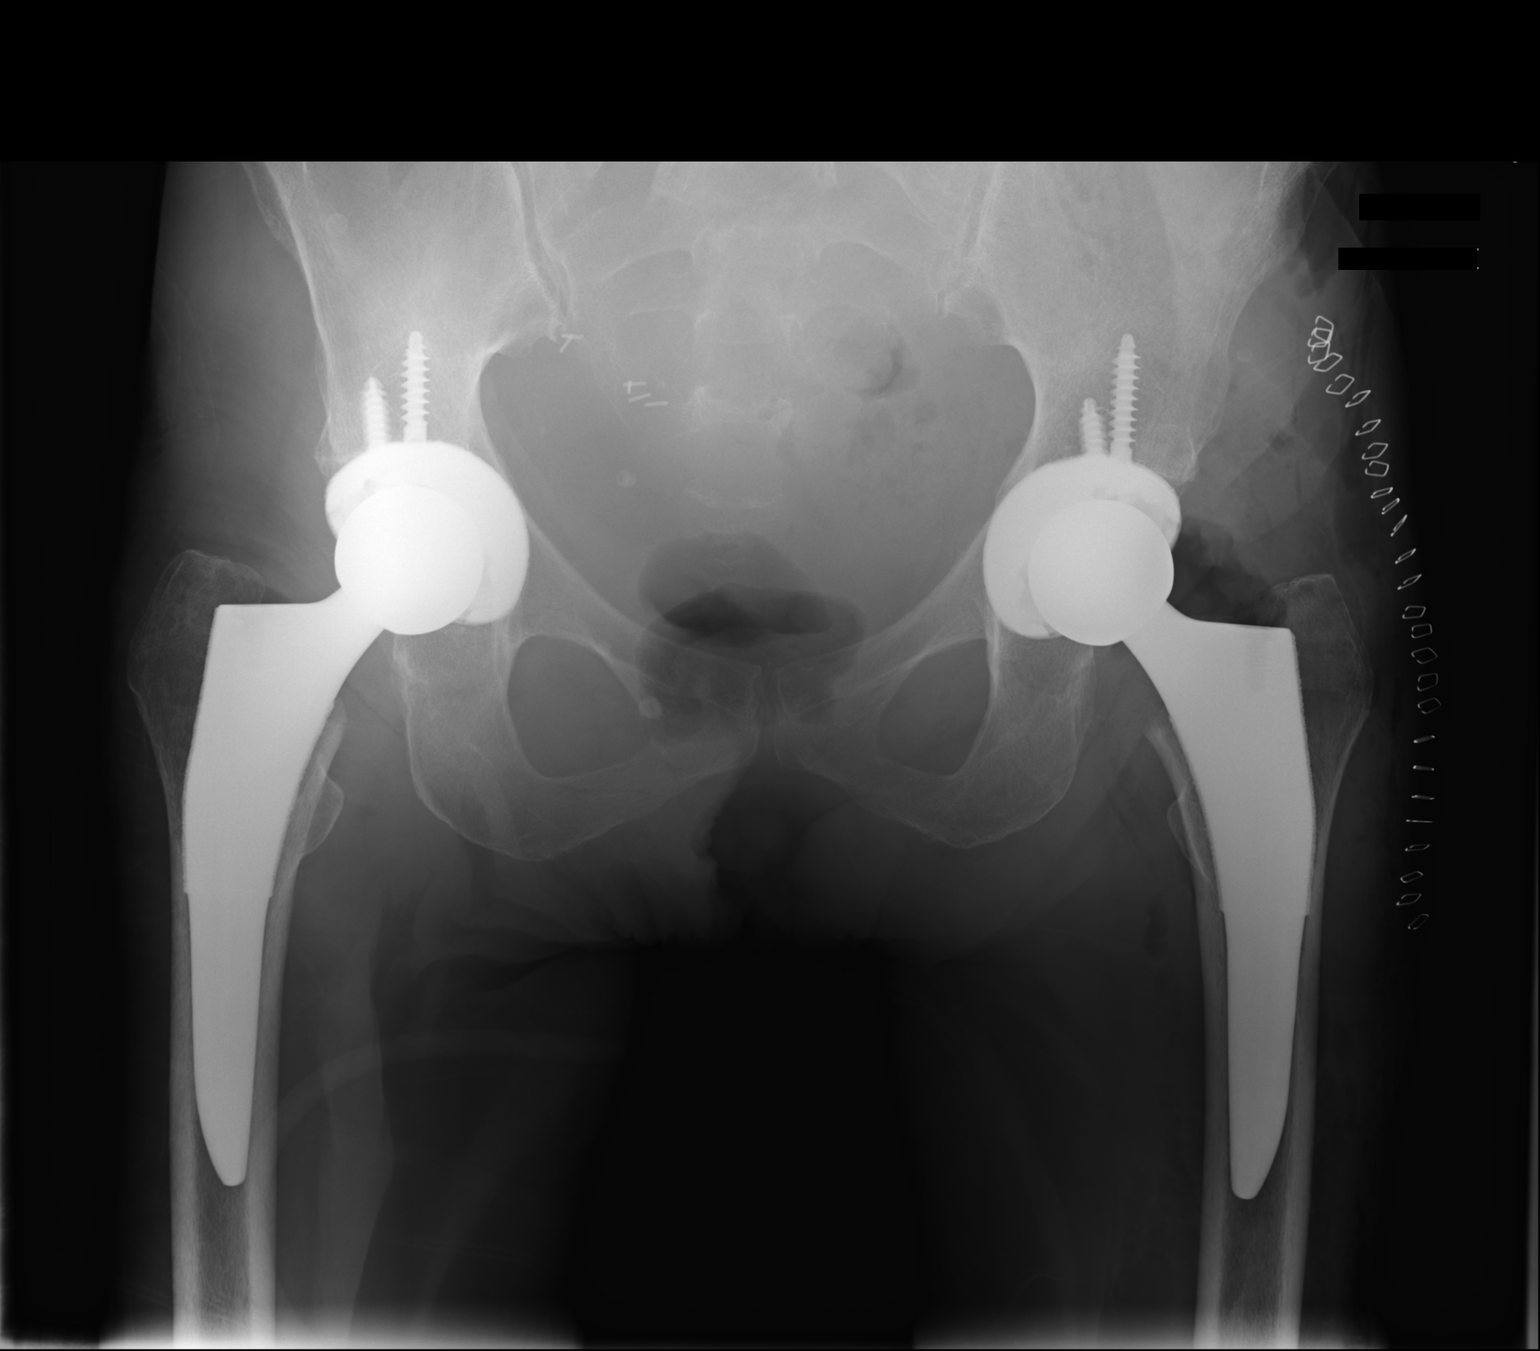

[1 of 1 positions shown; findings below may reference images not displayed]

FINDINGS: There are total hip replacements bilaterally with prosthetic
components bilaterally appearing well-seated. No fracture or
dislocation. Soft tissue air and skin staples on the left are
expected postoperative findings.
IMPRESSION: Total hip replacements now present bilaterally with prosthetic
components bilaterally appearing well-seated on frontal view. No
fracture or dislocation. Acute postoperative changes noted on the
left.

## 2020-03-11 ENCOUNTER — Telehealth: Payer: Self-pay | Admitting: *Deleted

## 2020-03-11 ENCOUNTER — Other Ambulatory Visit: Payer: Self-pay | Admitting: *Deleted

## 2020-03-11 DIAGNOSIS — D500 Iron deficiency anemia secondary to blood loss (chronic): Secondary | ICD-10-CM

## 2020-03-11 MED ORDER — SUCRALFATE 1 G PO TABS: 1.0000 g | ORAL_TABLET | Freq: Three times a day (TID) | ORAL | 1 refills | Status: DC

## 2020-03-11 NOTE — Telephone Encounter (Signed)
Called to report she had COVID 09/2019 and received the [place] and REGN 10987 treatment. Asking if OK to receive the Phizer COVID vaccine in a few weeks?  Informed her it is OK to have vaccines as scheduled.

## 2020-04-04 ENCOUNTER — Telehealth: Payer: Self-pay | Admitting: *Deleted

## 2020-04-04 NOTE — Telephone Encounter (Signed)
Had 1st Pfizer COVID vaccine 2 weeks ago and is asking how long can she wait to have the 2nd dose? Also her son had his 1st vaccine when she did and he got COVID 1 week later. When should he re-vaccinate? Per Pharmacy after checking CDC guidelines: She should have her 2nd dose within 6 weeks at the longest. Limited data on effectiveness of receiving 2nd vaccine > 6 weeks after 1st. Also informed her that son should wait 6 months before receiving his 2nd dose.

## 2020-05-03 ENCOUNTER — Other Ambulatory Visit: Payer: Self-pay

## 2020-05-03 ENCOUNTER — Encounter: Payer: Self-pay | Admitting: Nurse Practitioner

## 2020-05-03 ENCOUNTER — Ambulatory Visit (INDEPENDENT_AMBULATORY_CARE_PROVIDER_SITE_OTHER): Payer: PPO

## 2020-05-03 ENCOUNTER — Ambulatory Visit (INDEPENDENT_AMBULATORY_CARE_PROVIDER_SITE_OTHER): Payer: PPO | Admitting: Nurse Practitioner

## 2020-05-03 VITALS — BP 109/65 | HR 91 | Temp 97.8°F | Ht 65.0 in | Wt 102.6 lb

## 2020-05-03 DIAGNOSIS — F411 Generalized anxiety disorder: Secondary | ICD-10-CM

## 2020-05-03 DIAGNOSIS — M80051A Age-related osteoporosis with current pathological fracture, right femur, initial encounter for fracture: Secondary | ICD-10-CM | POA: Diagnosis not present

## 2020-05-03 DIAGNOSIS — D500 Iron deficiency anemia secondary to blood loss (chronic): Secondary | ICD-10-CM

## 2020-05-03 DIAGNOSIS — Z681 Body mass index (BMI) 19 or less, adult: Secondary | ICD-10-CM

## 2020-05-03 DIAGNOSIS — Z780 Asymptomatic menopausal state: Secondary | ICD-10-CM | POA: Diagnosis not present

## 2020-05-03 DIAGNOSIS — Z23 Encounter for immunization: Secondary | ICD-10-CM | POA: Diagnosis not present

## 2020-05-03 DIAGNOSIS — H6123 Impacted cerumen, bilateral: Secondary | ICD-10-CM

## 2020-05-03 DIAGNOSIS — I471 Supraventricular tachycardia: Secondary | ICD-10-CM

## 2020-05-03 DIAGNOSIS — K219 Gastro-esophageal reflux disease without esophagitis: Secondary | ICD-10-CM

## 2020-05-03 DIAGNOSIS — E034 Atrophy of thyroid (acquired): Secondary | ICD-10-CM | POA: Diagnosis not present

## 2020-05-03 DIAGNOSIS — E43 Unspecified severe protein-calorie malnutrition: Secondary | ICD-10-CM | POA: Diagnosis not present

## 2020-05-03 DIAGNOSIS — Z903 Acquired absence of stomach [part of]: Secondary | ICD-10-CM

## 2020-05-03 DIAGNOSIS — K912 Postsurgical malabsorption, not elsewhere classified: Secondary | ICD-10-CM

## 2020-05-03 MED ORDER — LEVOTHYROXINE SODIUM 100 MCG PO TABS: 100.0000 ug | ORAL_TABLET | Freq: Every morning | ORAL | 1 refills | Status: DC

## 2020-05-03 MED ORDER — OMEPRAZOLE 40 MG PO CPDR: 40.0000 mg | DELAYED_RELEASE_CAPSULE | Freq: Every day | ORAL | 1 refills | Status: DC

## 2020-05-03 MED ORDER — SUCRALFATE 1 G PO TABS: 1.0000 g | ORAL_TABLET | Freq: Three times a day (TID) | ORAL | 1 refills | Status: DC

## 2020-05-03 MED ORDER — ALPRAZOLAM 0.5 MG PO TABS: 0.5000 mg | ORAL_TABLET | Freq: Four times a day (QID) | ORAL | 5 refills | Status: DC | PRN

## 2020-05-03 MED ORDER — FOLIC ACID 800 MCG PO TABS: 800.0000 ug | ORAL_TABLET | Freq: Every day | ORAL | 1 refills | Status: DC

## 2020-05-03 MED ORDER — SERTRALINE HCL 100 MG PO TABS: 200.0000 mg | ORAL_TABLET | Freq: Every day | ORAL | 1 refills | Status: DC

## 2020-05-03 NOTE — Progress Notes (Signed)
Subjective:    Patient ID: Brittany Hensley, female    DOB: Jan 26, 1951, 69 y.o.   MRN: [id number]   Chief Complaint: Medical Management of Chronic Issues    HPI:  1. Gastroesophageal reflux disease without esophagitis  Patient is on daily dose of omeprazle and does ok. She has had her esophagus stretched and her gastric outlet stretched several times.  2. Postgastrectomy malabsorption Has lots of absorption problems and has hard time maintaining her weight.  3. Protein-calorie malnutrition, severe ([place]) She eats a lot of proteins  4. Hypothyroidism due to acquired atrophy of thyroid No problems that she is aware of.  5. Atrioventricular nodal re-entry tachycardia ([place]) Denies palpitation or feeling of heart racing.  6. Age-related osteoporosis with current pathological fracture of right femur, initial encounter ([place]) Cannot find last bine density test. She is on vitamin d and calcium daily  7. GAD (generalized anxiety disorder) She is on xanax 4x a day. We have tried cutting her back with no success.  GAD 7 : Generalized Anxiety Score 05/03/2020 12/01/2019 01/28/2017  Nervous, Anxious, on Edge 1 1 3   Control/stop worrying 1 0 3  Worry too much - different things 0 1 3  Trouble relaxing 0 0 2  Restless 0 0 2  Easily annoyed or irritable 0 0 2  Afraid - awful might happen 0 0 3  Total GAD 7 Score 2 2 18   Anxiety Difficulty Not difficult at all Not difficult at all Very difficult       8. Iron deficiency anemia due to chronic blood loss Sees Dr. Learta Codding every 6 months. Has appointmnet next week Lab Results  Component Value Date   FERRITIN 88 01/24/2020     9. BMI less than 19,adult No recent weight changes Wt Readings from Last 3 Encounters:  05/03/20 102 lb 9.6 oz (46.5 kg)  01/29/20 105 lb (47.6 kg)  12/27/19 104 lb (47.2 kg)   BMI Readings from Last 3 Encounters:  05/03/20 17.07 kg/m  01/29/20 17.47 kg/m  12/27/19 17.31 kg/m       Outpatient  Encounter Medications as of 05/03/2020  Medication Sig   acetaminophen (TYLENOL) 500 MG tablet Take 500 mg by mouth 2 (two) times daily.    albuterol (VENTOLIN HFA) 108 (90 Base) MCG/ACT inhaler Inhale 2 puffs into the lungs every 6 (six) hours as needed for wheezing or shortness of breath.   ALPRAZolam (XANAX) 0.5 MG tablet Take 1 tablet (0.5 mg total) by mouth 4 (four) times daily as needed for anxiety.   Ascorbic Acid (VITAMIN C) 1000 MG tablet Take 1,000 mg by mouth in the morning and at bedtime.    b complex vitamins capsule Take 1 capsule by mouth daily.   Biotin 10 MG CAPS Take 10 mg by mouth daily.   Calcium Carbonate (CALCIUM 600 PO) Take 600 mg by mouth 2 (two) times daily.    Cholecalciferol (VITAMIN D) 2000 units tablet Take 2,000 Units by mouth 2 (two) times daily.   docusate sodium (COLACE) 100 MG capsule Take 1 capsule (100 mg total) by mouth 2 (two) times daily. (Patient taking differently: Take 100 mg by mouth daily. )   Flaxseed, Linseed, (FLAXSEED OIL) 1200 MG CAPS Take 1,200 mg by mouth daily.    folic acid (FOLVITE) 076 MCG tablet Take 1 tablet (800 mcg total) by mouth daily.   levothyroxine (SYNTHROID) 100 MCG tablet Take 1 tablet (100 mcg total) by mouth every morning.   loratadine (CLARITIN) 10  MG tablet Take 1 tablet (10 mg total) by mouth daily.   Multiple Vitamins-Minerals (CENTRUM SILVER) CHEW Chew 1 tablet by mouth 2 (two) times daily.    omeprazole (PRILOSEC) 40 MG capsule TAKE ONE (1) CAPSULE EACH DAY (Patient taking differently: Take 40 mg by mouth daily. )   ondansetron (ZOFRAN-ODT) 4 MG disintegrating tablet Take 1 tablet (4 mg total) by mouth every 8 (eight) hours as needed for nausea or vomiting.   Polyethyl Glycol-Propyl Glycol (SYSTANE OP) Place 1 drop into both eyes daily as needed (dry eyes).   Probiotic Product (PROBIOTIC PO) Take 1 capsule by mouth daily.    sertraline (ZOLOFT) 100 MG tablet Take 2 tablets (200 mg total) by mouth daily.     sucralfate (CARAFATE) 1 g tablet Take 1 tablet (1 g total) by mouth 3 (three) times daily with meals.   Turmeric Curcumin 500 MG CAPS Take 500 mg by mouth 2 (two) times a day.   No facility-administered encounter medications on file as of 05/03/2020.    Past Surgical History:  Procedure Laterality Date   APPENDECTOMY     BALLOON DILATION  06/16/2011   Procedure: BALLOON DILATION;  Surgeon: Jeryl Columbia, MD;  Location: [facility] ENDOSCOPY;  Service: Endoscopy;  Laterality: N/A;   BALLOON DILATION N/A 10/03/2013   Procedure: BALLOON DILATION;  Surgeon: Jeryl Columbia, MD;  Location: [facility] ENDOSCOPY;  Service: Endoscopy;  Laterality: N/A;   BALLOON DILATION N/A 08/10/2014   Procedure: BALLOON DILATION;  Surgeon: Jeryl Columbia, MD;  Location: [facility] ENDOSCOPY;  Service: Endoscopy;  Laterality: N/A;  from 12 - 15 cm dilation completed   BALLOON DILATION N/A 02/15/2015   Procedure: BALLOON DILATION;  Surgeon: Clarene Essex, MD;  Location: [facility] ENDOSCOPY;  Service: Endoscopy;  Laterality: N/A;   BALLOON DILATION N/A 11/12/2015   Procedure: BALLOON DILATION;  Surgeon: Clarene Essex, MD;  Location: [facility] ENDOSCOPY;  Service: Endoscopy;  Laterality: N/A;   BALLOON DILATION N/A 08/10/2016   Procedure: BALLOON DILATION;  Surgeon: Clarene Essex, MD;  Location[facility]OSCOPY;  Service: Endoscopy;  Laterality: N/A;   BALLOON DILATION N/A 08/07/2016   Procedure: BALLOON DILATION;  Surgeon: Clarene Essex, MD;  Location: [facility] ENDOSCOPY;  Service: Endoscopy;  Laterality: N/A;   BALLOON DILATION N/A 05/20/2017   Procedure: BALLOON DILATION;  Surgeon: Clarene Essex, MD;  Location: [facility] ENDOSCOPY;  Service: Endoscopy;  Laterality: N/A;   BALLOON DILATION N/A 12/02/2017   Procedure: BALLOON DILATION;  Surgeon: Clarene Essex, MD;  Location: [place];  Service: Endoscopy;  Laterality: N/A;   BALLOON DILATION N/A 07/19/2018   Procedure: BALLOON DILATION;  Surgeon: Clarene Essex, MD;  Location: [facility] ENDOSCOPY;  Service: Endoscopy;  Laterality: N/A;    BALLOON DILATION N/A 01/23/2019   Procedure: BALLOON DILATION;  Surgeon: Clarene Essex, MD;  Location: [facility] ENDOSCOPY;  Service: Endoscopy;  Laterality: N/A;   BALLOON DILATION N/A 07/13/2019   Procedure: BALLOON DILATION;  Surgeon: Clarene Essex, MD;  Location: [facility] ENDOSCOPY;  Service: Endoscopy;  Laterality: N/A;   BALLOON DILATION N/A 02/02/2020   Procedure: BALLOON DILATION;  Surgeon: Clarene Essex, MD;  Location: [facility] ENDOSCOPY;  Service: Endoscopy;  Laterality: N/A;   CATARACT EXTRACTION, BILATERAL     CHOLECYSTECTOMY OPEN  1979   COLONOSCOPY     Hx: of   ESOPHAGOGASTRODUODENOSCOPY  06/16/2011   Procedure: ESOPHAGOGASTRODUODENOSCOPY (EGD);  Surgeon: Jeryl Columbia, MD;  Location: [facility] ENDOSCOPY;  Service: Endoscopy;  Laterality: N/A;   ESOPHAGOGASTRODUODENOSCOPY N/A 10/03/2013   Procedure: ESOPHAGOGASTRODUODENOSCOPY (EGD);  Surgeon:  Jeryl Columbia, MD;  Location: [facility] ENDOSCOPY;  Service: Endoscopy;  Laterality: N/A;   ESOPHAGOGASTRODUODENOSCOPY N/A 08/10/2014   Procedure: ESOPHAGOGASTRODUODENOSCOPY (EGD);  Surgeon: Jeryl Columbia, MD;  Location: [facility] ENDOSCOPY;  Service: Endoscopy;  Laterality: N/A;   ESOPHAGOGASTRODUODENOSCOPY N/A 08/10/2016   Procedure: ESOPHAGOGASTRODUODENOSCOPY (EGD);  Surgeon: Clarene Essex, MD;  Location: [facility] ENDOSCOPY;  Service: Endoscopy;  Laterality: N/A;   ESOPHAGOGASTRODUODENOSCOPY N/A 07/19/2018   Procedure: ESOPHAGOGASTRODUODENOSCOPY (EGD);  Surgeon: Clarene Essex, MD;  Location: [facility] ENDOSCOPY;  Service: Endoscopy;  Laterality: N/A;   ESOPHAGOGASTRODUODENOSCOPY (EGD) WITH PROPOFOL N/A 02/15/2015   Procedure: ESOPHAGOGASTRODUODENOSCOPY (EGD) WITH PROPOFOL;  Surgeon: Clarene Essex, MD;  Location: [facility] ENDOSCOPY;  Service: Endoscopy;  Laterality: N/A;   ESOPHAGOGASTRODUODENOSCOPY (EGD) WITH PROPOFOL N/A 11/12/2015   Procedure: ESOPHAGOGASTRODUODENOSCOPY (EGD) WITH PROPOFOL;  Surgeon: Clarene Essex, MD;  Location: [facility] ENDOSCOPY;  Service: Endoscopy;  Laterality: N/A;   ESOPHAGOGASTRODUODENOSCOPY  (EGD) WITH PROPOFOL N/A 08/07/2016   Procedure: ESOPHAGOGASTRODUODENOSCOPY (EGD) WITH PROPOFOL;  Surgeon: Clarene Essex, MD;  Location: [facility] ENDOSCOPY;  Service: Endoscopy;  Laterality: N/A;   ESOPHAGOGASTRODUODENOSCOPY (EGD) WITH PROPOFOL N/A 05/20/2017   Procedure: ESOPHAGOGASTRODUODENOSCOPY (EGD) WITH PROPOFOL;  Surgeon: Clarene Essex, MD;  Location: [facility] ENDOSCOPY;  Service: Endoscopy;  Laterality: N/A;   ESOPHAGOGASTRODUODENOSCOPY (EGD) WITH PROPOFOL N/A 12/02/2017   Procedure: ESOPHAGOGASTRODUODENOSCOPY (EGD) WITH PROPOFOL;  Surgeon: Clarene Essex, MD;  Location: [place];  Service: Endoscopy;  Laterality: N/A;  have c arm available   ESOPHAGOGASTRODUODENOSCOPY (EGD) WITH PROPOFOL N/A 01/23/2019   Procedure: ESOPHAGOGASTRODUODENOSCOPY (EGD) WITH PROPOFOL;  Surgeon: Clarene Essex, MD;  Location: [facility] ENDOSCOPY;  Service: Endoscopy;  Laterality: N/A;   ESOPHAGOGASTRODUODENOSCOPY (EGD) WITH PROPOFOL N/A 07/13/2019   Procedure: ESOPHAGOGASTRODUODENOSCOPY (EGD) WITH PROPOFOL;  Surgeon: Clarene Essex, MD;  Location: [facility] ENDOSCOPY;  Service: Endoscopy;  Laterality: N/A;   ESOPHAGOGASTRODUODENOSCOPY (EGD) WITH PROPOFOL N/A 02/02/2020   Procedure: ESOPHAGOGASTRODUODENOSCOPY (EGD) WITH PROPOFOL;  Surgeon: Clarene Essex, MD;  Location: [facility] ENDOSCOPY;  Service: Endoscopy;  Laterality: N/A;   FOREIGN BODY REMOVAL  07/19/2018   Procedure: FOREIGN BODY REMOVAL;  Surgeon: Clarene Essex, MD;  Location: [facility] ENDOSCOPY;  Service: Endoscopy;;   FRACTURE SURGERY     Hx: of left wrist   GASTRECTOMY  [phone]   GASTRECTOMY N/A    X3   LITHOTRIPSY     Hx; of for kidney stones   SAVORY DILATION N/A 08/10/2014   Procedure: SAVORY DILATION;  Surgeon: Jeryl Columbia, MD;  Location: [facility] ENDOSCOPY;  Service: Endoscopy;  Laterality: N/A;   TOTAL HIP ARTHROPLASTY Right 12/30/2017   Procedure: RIGHT TOTAL HIP ARTHROPLASTY;  Surgeon: Carole Civil, MD;  Location: [facility];  Service: Orthopedics;  Laterality: Right;   TOTAL HIP  ARTHROPLASTY Left 02/17/2018   Procedure: TOTAL HIP ARTHROPLASTY;  Surgeon: Carole Civil, MD;  Location: [facility];  Service: Orthopedics;  Laterality: Left;   TUBAL LIGATION     1975    Family History  Problem Relation Age of Onset   Diabetes Mother    Cancer - Prostate Brother     New complaints: None today  Social history: Lives by herself- her osn lives next door to her.  Controlled substance contract: 12/05/19     Review of Systems  Constitutional: Negative for diaphoresis.  Eyes: Negative for pain.  Respiratory: Negative for shortness of breath.   Cardiovascular: Negative for chest pain, palpitations and leg swelling.  Gastrointestinal: Negative for abdominal pain.  Endocrine: Negative for polydipsia.  Skin: Negative for rash.  Neurological: Negative for dizziness, weakness and  headaches.  Hematological: Does not bruise/bleed easily.  All other systems reviewed and are negative.      Objective:   Physical Exam Vitals and nursing note reviewed.  Constitutional:      General: She is not in acute distress.    Appearance: Normal appearance. She is well-developed.  HENT:     Head: Normocephalic.     Right Ear: There is impacted cerumen.     Left Ear: There is impacted cerumen.     Nose: Nose normal.  Eyes:     Pupils: Pupils are equal, round, and reactive to light.  Neck:     Vascular: No carotid bruit or JVD.  Cardiovascular:     Rate and Rhythm: Normal rate and regular rhythm.     Heart sounds: Murmur (2/6) heard.   Pulmonary:     Effort: Pulmonary effort is normal. No respiratory distress.     Breath sounds: Normal breath sounds. No wheezing or rales.  Chest:     Chest wall: No tenderness.  Abdominal:     General: Bowel sounds are normal. There is no distension or abdominal bruit.     Palpations: Abdomen is soft. There is no hepatomegaly, splenomegaly, mass or pulsatile mass.     Tenderness: There is no abdominal tenderness.  Musculoskeletal:         General: Normal range of motion.     Cervical back: Normal range of motion and neck supple.  Lymphadenopathy:     Cervical: No cervical adenopathy.  Skin:    General: Skin is warm and dry.  Neurological:     Mental Status: She is alert and oriented to person, place, and time.     Deep Tendon Reflexes: Reflexes are normal and symmetric.  Psychiatric:        Behavior: Behavior normal.        Thought Content: Thought content normal.        Judgment: Judgment normal.    BP 109/65    Pulse 91    Temp 97.8 F (36.6 C) (Temporal)    Ht 5' 5"  (1.651 m)    Wt 102 lb 9.6 oz (46.5 kg)    BMI 17.07 kg/m   S/p ear irrigation with curretting- TM'S clear bil      Assessment & Plan:  Brittany Hensley comes in today with chief complaint of Medical Management of Chronic Issues   Diagnosis and orders addressed:  1. Gastroesophageal reflux disease without esophagitis Avoid spicy foods Do not eat 2 hours prior to bedtime  - omeprazole (PRILOSEC) 40 MG capsule; Take 1 capsule (40 mg total) by mouth daily.  Dispense: 90 capsule; Refill: 1  2. Postgastrectomy malabsorption Continue to eat high protein diet  3. Protein-calorie malnutrition, severe ([place])  4. Hypothyroidism due to acquired atrophy of thyroid Labs pending - levothyroxine (SYNTHROID) 100 MCG tablet; Take 1 tablet (100 mcg total) by mouth every morning.  Dispense: 90 tablet; Refill: 1 - Thyroid Panel With TSH  5. Atrioventricular nodal re-entry tachycardia ([facility]) Avoid caffeine  6. Age-related osteoporosis with current pathological fracture of right femur, initial encounter ([place]) - DG [facility] DEXA  7. GAD (generalized anxiety disorder) Stress management - sertraline (ZOLOFT) 100 MG tablet; Take 2 tablets (200 mg total) by mouth daily.  Dispense: 180 tablet; Refill: 1 - ALPRAZolam (XANAX) 0.5 MG tablet; Take 1 tablet (0.5 mg total) by mouth 4 (four) times daily as needed for anxiety.  Dispense: 120 tablet; Refill: 5  8.  Iron deficiency  anemia due to chronic blood loss Labs pending - sucralfate (CARAFATE) 1 g tablet; Take 1 tablet (1 g total) by mouth 3 (three) times daily with meals.  Dispense: 90 tablet; Refill: 1 - folic acid (FOLVITE) 943 MCG tablet; Take 1 tablet (800 mcg total) by mouth daily.  Dispense: 90 tablet; Refill: 1 - CBC with Differential/Platelet - CMP14+EGFR - Lipid panel - Ferritin  9. BMI less than 19,adult Discussed diet for person with low BMI Will recheck weight in 3-6 months  10. Cerumen impaction Debrox several times a week  Labs pending Health Maintenance reviewed Diet and exercise encouraged  Follow up plan: 6 months   [place], FNP

## 2020-05-03 NOTE — Patient Instructions (Signed)
Earwax Buildup, Adult The ears produce a substance called earwax that helps keep bacteria out of the ear and protects the skin in the ear canal. Occasionally, earwax can build up in the ear and cause discomfort or hearing loss. What increases the risk? This condition is more likely to develop in people who:  Are female.  Are elderly.  Naturally produce more earwax.  Clean their ears often with cotton swabs.  Use earplugs often.  Use in-ear headphones often.  Wear hearing aids.  Have narrow ear canals.  Have earwax that is overly thick or sticky.  Have eczema.  Are dehydrated.  Have excess hair in the ear canal. What are the signs or symptoms? Symptoms of this condition include:  Reduced or muffled hearing.  A feeling of fullness in the ear or feeling that the ear is plugged.  Fluid coming from the ear.  Ear pain.  Ear itch.  Ringing in the ear.  Coughing.  An obvious piece of earwax that can be seen inside the ear canal. How is this diagnosed? This condition may be diagnosed based on:  Your symptoms.  Your medical history.  An ear exam. During the exam, your health care provider will look into your ear with an instrument called an otoscope. You may have tests, including a hearing test. How is this treated? This condition may be treated by:  Using ear drops to soften the earwax.  Having the earwax removed by a health care provider. The health care provider may: ? Flush the ear with water. ? Use an instrument that has a loop on the end (curette). ? Use a suction device.  Surgery to remove the wax buildup. This may be done in severe cases. Follow these instructions at home:   Take over-the-counter and prescription medicines only as told by your health care provider.  Do not put any objects, including cotton swabs, into your ear. You can clean the opening of your ear canal with a washcloth or facial tissue.  Follow instructions from your health care  provider about cleaning your ears. Do not over-clean your ears.  Drink enough fluid to keep your urine clear or pale yellow. This will help to thin the earwax.  Keep all follow-up visits as told by your health care provider. If earwax builds up in your ears often or if you use hearing aids, consider seeing your health care provider for routine, preventive ear cleanings. Ask your health care provider how often you should schedule your cleanings.  If you have hearing aids, clean them according to instructions from the manufacturer and your health care provider. Contact a health care provider if:  You have ear pain.  You develop a fever.  You have blood, pus, or other fluid coming from your ear.  You have hearing loss.  You have ringing in your ears that does not go away.  Your symptoms do not improve with treatment.  You feel like the room is spinning (vertigo). Summary  Earwax can build up in the ear and cause discomfort or hearing loss.  The most common symptoms of this condition include reduced or muffled hearing and a feeling of fullness in the ear or feeling that the ear is plugged.  This condition may be diagnosed based on your symptoms, your medical history, and an ear exam.  This condition may be treated by using ear drops to soften the earwax or by having the earwax removed by a health care provider.  Do not put any   objects, including cotton swabs, into your ear. You can clean the opening of your ear canal with a washcloth or facial tissue. This information is not intended to replace advice given to you by your health care provider. Make sure you discuss any questions you have with your health care provider. Document Revised: 07/02/2017 Document Reviewed: 09/30/2016 Elsevier Patient Education  2020 Elsevier Inc.  

## 2020-05-03 NOTE — Addendum Note (Signed)
Addended by: Wardell Heath on: 05/03/2020 12:42 PM   Modules accepted: Orders

## 2020-05-04 LAB — CBC WITH DIFFERENTIAL/PLATELET
Basophils Absolute: 0 10*3/uL (ref 0.0–0.2)
Basos: 1 %
EOS (ABSOLUTE): 0.2 10*3/uL (ref 0.0–0.4)
Eos: 4 %
Hematocrit: 38.6 % (ref 34.0–46.6)
Hemoglobin: 12.5 g/dL (ref 11.1–15.9)
Immature Grans (Abs): 0 10*3/uL (ref 0.0–0.1)
Immature Granulocytes: 0 %
Lymphocytes Absolute: 1.8 10*3/uL (ref 0.7–3.1)
Lymphs: 34 %
MCH: 33 pg (ref 26.6–33.0)
MCHC: 32.4 g/dL (ref 31.5–35.7)
MCV: 102 fL — ABNORMAL HIGH (ref 79–97)
Monocytes Absolute: 0.8 10*3/uL (ref 0.1–0.9)
Monocytes: 14 %
Neutrophils Absolute: 2.5 10*3/uL (ref 1.4–7.0)
Neutrophils: 47 %
Platelets: 238 10*3/uL (ref 150–450)
RBC: 3.79 x10E6/uL (ref 3.77–5.28)
RDW: 12.4 % (ref 11.7–15.4)
WBC: 5.3 10*3/uL (ref 3.4–10.8)

## 2020-05-04 LAB — CMP14+EGFR
ALT: 17 IU/L (ref 0–32)
AST: 20 IU/L (ref 0–40)
Albumin/Globulin Ratio: 2.2 (ref 1.2–2.2)
Albumin: 3.9 g/dL (ref 3.8–4.8)
Alkaline Phosphatase: 63 IU/L (ref 44–121)
BUN/Creatinine Ratio: 27 (ref 12–28)
BUN: 28 mg/dL — ABNORMAL HIGH (ref 8–27)
Bilirubin Total: <0.2 mg/dL (ref 0.0–1.2)
CO2: 20 mmol/L (ref 20–29)
Calcium: 8.9 mg/dL (ref 8.7–10.3)
Chloride: 112 mmol/L — ABNORMAL HIGH (ref 96–106)
Creatinine, Ser: 1.04 mg/dL — ABNORMAL HIGH (ref 0.57–1.00)
GFR calc Af Amer: 63 mL/min/{1.73_m2} (ref >59)
GFR calc non Af Amer: 55 mL/min/{1.73_m2} — ABNORMAL LOW (ref >59)
Globulin, Total: 1.8 g/dL (ref 1.5–4.5)
Glucose: 84 mg/dL (ref 65–99)
Potassium: 4.1 mmol/L (ref 3.5–5.2)
Sodium: 144 mmol/L (ref 134–144)
Total Protein: 5.7 g/dL — ABNORMAL LOW (ref 6.0–8.5)

## 2020-05-04 LAB — THYROID PANEL WITH TSH
Free Thyroxine Index: 1 — ABNORMAL LOW (ref 1.2–4.9)
T3 Uptake Ratio: 27 % (ref 24–39)
T4, Total: 3.8 ug/dL — ABNORMAL LOW (ref 4.5–12.0)
TSH: 3.13 u[IU]/mL (ref 0.450–4.500)

## 2020-05-04 LAB — LIPID PANEL
Chol/HDL Ratio: 4 ratio (ref 0.0–4.4)
Cholesterol, Total: 190 mg/dL (ref 100–199)
HDL: 48 mg/dL (ref >39)
LDL Chol Calc (NIH): 100 mg/dL — ABNORMAL HIGH (ref 0–99)
Triglycerides: 246 mg/dL — ABNORMAL HIGH (ref 0–149)
VLDL Cholesterol Cal: 42 mg/dL — ABNORMAL HIGH (ref 5–40)

## 2020-05-04 LAB — FERRITIN: Ferritin: 95 ng/mL (ref 15–150)

## 2020-05-07 ENCOUNTER — Other Ambulatory Visit: Payer: Self-pay | Admitting: *Deleted

## 2020-05-07 NOTE — Telephone Encounter (Signed)
What does she needs this for

## 2020-05-07 NOTE — Telephone Encounter (Signed)
Patient states that she has a dental procedure coming up soon

## 2020-05-07 NOTE — Telephone Encounter (Signed)
[facility] for Azithromycin 250 mg #2 1 tab 30-60 mins before procedure Please advise

## 2020-05-13 ENCOUNTER — Telehealth: Payer: Self-pay

## 2020-05-13 NOTE — Telephone Encounter (Signed)
Pt wants to talk to nurse about her dexa scan results please call back

## 2020-05-13 NOTE — Telephone Encounter (Signed)
Pt wants a copy of recent labs and dexa. She will be by in an hour to pick up.

## 2020-05-13 NOTE — Telephone Encounter (Signed)
Please advise Dexa result from 10/1

## 2020-05-13 NOTE — Telephone Encounter (Signed)
Pt last labs and DEXA results printed

## 2020-05-14 NOTE — Telephone Encounter (Signed)
Needs to make an appointment with me for osteoporosis

## 2020-05-14 NOTE — Telephone Encounter (Signed)
Appt made and patient aware.  

## 2020-05-15 ENCOUNTER — Encounter: Payer: Self-pay | Admitting: *Deleted

## 2020-05-22 NOTE — Progress Notes (Signed)
Patient notified and appt with Almyra Free made

## 2020-05-29 ENCOUNTER — Telehealth: Payer: Self-pay | Admitting: Orthopedic Surgery

## 2020-05-29 NOTE — Telephone Encounter (Signed)
Patient called (05/28/20) - follow up message - patient requests Dr Aline Brochure to review her recent [place] ordered by primary care provider at [facility], [place], [place]. States since she has done so well with her hip since her surgery by Dr Aline Brochure, and said there was some information on the report that she would like to have Dr Aline Brochure review.

## 2020-05-30 ENCOUNTER — Telehealth: Payer: Self-pay | Admitting: Oncology

## 2020-05-30 ENCOUNTER — Inpatient Hospital Stay: Payer: PPO | Attending: Oncology

## 2020-05-30 ENCOUNTER — Inpatient Hospital Stay (HOSPITAL_BASED_OUTPATIENT_CLINIC_OR_DEPARTMENT_OTHER): Payer: PPO | Admitting: Oncology

## 2020-05-30 ENCOUNTER — Other Ambulatory Visit: Payer: Self-pay

## 2020-05-30 VITALS — BP 123/77 | HR 93 | Temp 98.8°F | Resp 17 | Ht 65.0 in | Wt 103.0 lb

## 2020-05-30 DIAGNOSIS — D500 Iron deficiency anemia secondary to blood loss (chronic): Secondary | ICD-10-CM | POA: Diagnosis not present

## 2020-05-30 DIAGNOSIS — Z79899 Other long term (current) drug therapy: Secondary | ICD-10-CM | POA: Diagnosis not present

## 2020-05-30 DIAGNOSIS — D508 Other iron deficiency anemias: Secondary | ICD-10-CM | POA: Diagnosis not present

## 2020-05-30 DIAGNOSIS — Z8616 Personal history of COVID-19: Secondary | ICD-10-CM | POA: Insufficient documentation

## 2020-05-30 LAB — CBC WITH DIFFERENTIAL (CANCER CENTER ONLY)
Abs Immature Granulocytes: 0.01 10*3/uL (ref 0.00–0.07)
Basophils Absolute: 0 10*3/uL (ref 0.0–0.1)
Basophils Relative: 1 %
Eosinophils Absolute: 0.2 10*3/uL (ref 0.0–0.5)
Eosinophils Relative: 3 %
HCT: 37.8 % (ref 36.0–46.0)
Hemoglobin: 12.2 g/dL (ref 12.0–15.0)
Immature Granulocytes: 0 %
Lymphocytes Relative: 29 %
Lymphs Abs: 1.6 10*3/uL (ref 0.7–4.0)
MCH: 33.6 pg (ref 26.0–34.0)
MCHC: 32.3 g/dL (ref 30.0–36.0)
MCV: 104.1 fL — ABNORMAL HIGH (ref 80.0–100.0)
Monocytes Absolute: 0.7 10*3/uL (ref 0.1–1.0)
Monocytes Relative: 12 %
Neutro Abs: 3 10*3/uL (ref 1.7–7.7)
Neutrophils Relative %: 55 %
Platelet Count: 237 10*3/uL (ref 150–400)
RBC: 3.63 MIL/uL — ABNORMAL LOW (ref 3.87–5.11)
RDW: 13.3 % (ref 11.5–15.5)
WBC Count: 5.6 10*3/uL (ref 4.0–10.5)
nRBC: 0 % (ref 0.0–0.2)

## 2020-05-30 LAB — FERRITIN: Ferritin: 52 ng/mL (ref 11–307)

## 2020-05-30 NOTE — Progress Notes (Signed)
  [place] OFFICE PROGRESS NOTE   Diagnosis: Iron deficiency anemia  INTERVAL HISTORY:   [name] returns as scheduled.  She was last treated with IV iron in November 2020.  She feels well at present.  She was diagnosed with COVID-19 infection in January and was treated with antibody therapy.  She has received the COVID-19 vaccine.  No bleeding.  Objective:  Vital signs in last 24 hours:  Blood pressure 123/77, pulse 93, temperature 98.8 F (37.1 C), temperature source Tympanic, resp. rate 17, height 5\' 5"  (1.651 m), weight 103 lb (46.7 kg), SpO2 99 %.    Resp: Lungs clear bilaterally Cardio: Regular rate and rhythm GI: No hepatosplenomegaly, nontender Vascular: No leg edema  Lab Results:  Lab Results  Component Value Date   WBC 5.6 05/30/2020   HGB 12.2 05/30/2020   HCT 37.8 05/30/2020   MCV 104.1 (H) 05/30/2020   PLT 237 05/30/2020   NEUTROABS 3.0 05/30/2020    CMP  Lab Results  Component Value Date   NA 144 05/03/2020   K 4.1 05/03/2020   CL 112 (H) 05/03/2020   CO2 20 05/03/2020   GLUCOSE 84 05/03/2020   BUN 28 (H) 05/03/2020   CREATININE 1.04 (H) 05/03/2020   CALCIUM 8.9 05/03/2020   PROT 5.7 (L) 05/03/2020   ALBUMIN 3.9 05/03/2020   AST 20 05/03/2020   ALT 17 05/03/2020   ALKPHOS 63 05/03/2020   BILITOT <0.2 05/03/2020   GFRNONAA 55 (L) 05/03/2020   GFRAA 63 05/03/2020    Ferritin-52 Medications: I have reviewed the patient's current medications.   Assessment/Plan: 1. Chronic anemia secondary to gastrointestinal blood loss and iron deficiency, she last received IV iron 06/12/2019 2. History of kidney stones, followed by Dr. Roni Bread. 3. Gram-negative sepsis and septic shock syndrome April 2008. 4. History of acute renal failure secondary to obstruction and septic shock. 5. History of adrenal insufficiency, no longer on hormone replacement.  6. Questionable allergic to Venofer in June 2010. She reported an elevated blood pressure  and "dizziness" on the day following the Venofer therapy. She has tolerated iron dextran without an apparent reaction. 7. History of a left forearm fracture.  8. Intermittent low-serum bicarbonate level, ? related to diarrhea or renal insufficiency 9. Dysphagia, improved with erythromycin and esophageal dilatation procedures , status post dilatation of the gastric jejunal anastomosis 08/10/2014,02/15/2015, 11/12/2015, 08/10/2016, 05/20/2017 10. Chest x-ray 05/21/2017-COPD changes with questionable nodular density inferior left chest. CT chest scheduled 06/07/2017. 11. Right hip arthroplasty May 2019, left hip arthroplasty July 2019 12. COVID-19 infection January 2021 13. Osteoporosis   Disposition: Brittany Hensley appears stable.  The hemoglobin remains in the low normal range, but the ferritin is decreasing.  She would like to receive IV iron.  She will be scheduled for IV iron dextran within the next few weeks.  A bone density scan 05/03/2020 revealed osteoporosis.  She is taking calcium and vitamin D.  I recommended she discuss bone building therapy with her primary provider.  She is concerned about potential side effects of biphosphonate's including GI toxicity and osteonecrosis.  Brittany Hensley will return for lab studies in 4 months and 8 months.  She will be scheduled for a 1 year office visit.  Brittany Coder, MD  05/30/2020  12:35 PM

## 2020-05-30 NOTE — Telephone Encounter (Signed)
Scheduled per 10/28 los. Printed avs and calendar for pt.  

## 2020-06-03 ENCOUNTER — Telehealth: Payer: Self-pay

## 2020-06-03 NOTE — Telephone Encounter (Signed)
Pt has concerns as she says a nurse told her last week "if she is riding down the road and hit a bump she could break her back" I advised pt I don't see any documentation regarding a call from a nurse last week. I did advise her that her DEXA shows she has osteoporosis and pt has appt with Lottie Dawson to discuss on 06/18/20. Pt voiced understanding.

## 2020-06-10 ENCOUNTER — Ambulatory Visit: Payer: PPO | Admitting: Nurse Practitioner

## 2020-06-10 ENCOUNTER — Other Ambulatory Visit: Payer: Self-pay

## 2020-06-10 ENCOUNTER — Ambulatory Visit (INDEPENDENT_AMBULATORY_CARE_PROVIDER_SITE_OTHER): Payer: PPO | Admitting: Orthopedic Surgery

## 2020-06-10 ENCOUNTER — Ambulatory Visit: Payer: PPO

## 2020-06-10 VITALS — BP 120/68 | HR 93 | Ht 65.0 in | Wt 102.0 lb

## 2020-06-10 DIAGNOSIS — Z96642 Presence of left artificial hip joint: Secondary | ICD-10-CM | POA: Diagnosis not present

## 2020-06-10 DIAGNOSIS — Z96641 Presence of right artificial hip joint: Secondary | ICD-10-CM

## 2020-06-10 NOTE — Progress Notes (Signed)
Chief Complaint  Patient presents with  . Discuss Dexa Scan    pt states paperwork she has mentions a right femur fractur, however the hemotologis stated he didnt see a fracture when he pulled up her scan    Brittany Hensley became concerned because her diagnosis was hip fracture from osteoporosis.  I saw the paper and this was the diagnosis ordered when she got the bone density.  She does not have a hip fracture her bone density is low she has osteoporosis she should take calcium vitamin D and then she and her doctor can determine if she needs another additional medication  Encounter Diagnoses  Name Primary?  . S/P hip replacement, right 12/30/17 Yes  . Status post total hip replacement, left 02/17/18

## 2020-06-13 ENCOUNTER — Telehealth: Payer: Self-pay | Admitting: *Deleted

## 2020-06-13 ENCOUNTER — Telehealth: Payer: Self-pay

## 2020-06-13 NOTE — Telephone Encounter (Signed)
[employer] form has came in - it is wanting approval for pt to take Calcitonin-[place] 400 units/ 6ml  This is not on current or history med list for this pt.  Take a look at her current meds and see if this is ok?   If yes, then we have a form with question that have to be filled out and faxed back.  Forward back to [place] or [place].

## 2020-06-13 NOTE — Telephone Encounter (Signed)
Brittany Hensley has prescritpion request

## 2020-06-13 NOTE — Telephone Encounter (Signed)
Note - pt also has appt with Almyra Free next week.

## 2020-06-14 NOTE — Telephone Encounter (Signed)
Form filled out and faxed 

## 2020-06-14 NOTE — Telephone Encounter (Signed)
Form faxed to [employer].

## 2020-06-17 ENCOUNTER — Other Ambulatory Visit: Payer: Self-pay

## 2020-06-17 ENCOUNTER — Inpatient Hospital Stay: Payer: PPO | Attending: Oncology

## 2020-06-17 VITALS — BP 112/66 | HR 78 | Temp 98.2°F | Resp 18

## 2020-06-17 DIAGNOSIS — Z79899 Other long term (current) drug therapy: Secondary | ICD-10-CM | POA: Diagnosis not present

## 2020-06-17 DIAGNOSIS — D508 Other iron deficiency anemias: Secondary | ICD-10-CM | POA: Insufficient documentation

## 2020-06-17 DIAGNOSIS — D500 Iron deficiency anemia secondary to blood loss (chronic): Secondary | ICD-10-CM

## 2020-06-17 MED ORDER — ONDANSETRON HCL 8 MG PO TABS
ORAL_TABLET | ORAL | Status: AC
  Filled 2020-06-17: qty 1

## 2020-06-17 MED ORDER — SODIUM CHLORIDE 0.9 % IV SOLN
25.0000 mg | Freq: Once | INTRAVENOUS | Status: AC
  Administered 2020-06-17: 25 mg via INTRAVENOUS
  Filled 2020-06-17: qty 0.5

## 2020-06-17 MED ORDER — SODIUM CHLORIDE 0.9 % IV SOLN
1500.0000 mg | Freq: Once | INTRAVENOUS | Status: AC
  Administered 2020-06-17: 1500 mg via INTRAVENOUS
  Filled 2020-06-17: qty 30

## 2020-06-17 MED ORDER — ACETAMINOPHEN 325 MG PO TABS
650.0000 mg | ORAL_TABLET | Freq: Once | ORAL | Status: AC
  Administered 2020-06-17: 650 mg via ORAL

## 2020-06-17 MED ORDER — ONDANSETRON HCL 8 MG PO TABS
8.0000 mg | ORAL_TABLET | Freq: Once | ORAL | Status: AC
  Administered 2020-06-17: 8 mg via ORAL

## 2020-06-17 MED ORDER — SODIUM CHLORIDE 0.9 % IV SOLN
Freq: Once | INTRAVENOUS | Status: AC
  Filled 2020-06-17: qty 250

## 2020-06-17 MED ORDER — DIPHENHYDRAMINE HCL 25 MG PO CAPS
50.0000 mg | ORAL_CAPSULE | Freq: Once | ORAL | Status: AC
  Administered 2020-06-17: 50 mg via ORAL

## 2020-06-17 MED ORDER — DIPHENHYDRAMINE HCL 25 MG PO CAPS
ORAL_CAPSULE | ORAL | Status: AC
  Filled 2020-06-17: qty 2

## 2020-06-17 MED ORDER — ACETAMINOPHEN 325 MG PO TABS
ORAL_TABLET | ORAL | Status: AC
  Filled 2020-06-17: qty 2

## 2020-06-17 NOTE — Patient Instructions (Signed)
Iron Dextran injection What is this medicine? IRON DEXTRAN (AHY ern DEX tran) is an iron complex. Iron is used to make healthy red blood cells, which carry oxygen and nutrients through the body. This medicine is used to treat people who cannot take iron by mouth and have low levels of iron in the blood. This medicine may be used for other purposes; ask your health care provider or pharmacist if you have questions. COMMON BRAND NAME(S): Dexferrum, INFeD What should I tell my health care provider before I take this medicine? They need to know if you have any of these conditions:  anemia not caused by low iron levels  heart disease  high levels of iron in the blood  kidney disease  liver disease  an unusual or allergic reaction to iron, other medicines, foods, dyes, or preservatives  pregnant or trying to get pregnant  breast-feeding How should I use this medicine? This medicine is for injection into a vein or a muscle. It is given by a health care professional in a hospital or clinic setting. Talk to your pediatrician regarding the use of this medicine in children. While this drug may be prescribed for children as young as 4 months old for selected conditions, precautions do apply. Overdosage: If you think you have taken too much of this medicine contact a poison control center or emergency room at once. NOTE: This medicine is only for you. Do not share this medicine with others. What if I miss a dose? It is important not to miss your dose. Call your doctor or health care professional if you are unable to keep an appointment. What may interact with this medicine? Do not take this medicine with any of the following medications:  deferoxamine  dimercaprol  other iron products This medicine may also interact with the following medications:  chloramphenicol  deferasirox This list may not describe all possible interactions. Give your health care provider a list of all the  medicines, herbs, non-prescription drugs, or dietary supplements you use. Also tell them if you smoke, drink alcohol, or use illegal drugs. Some items may interact with your medicine. What should I watch for while using this medicine? Visit your doctor or health care professional regularly. Tell your doctor if your symptoms do not start to get better or if they get worse. You may need blood work done while you are taking this medicine. You may need to follow a special diet. Talk to your doctor. Foods that contain iron include: whole grains/cereals, dried fruits, beans, or peas, leafy green vegetables, and organ meats (liver, kidney). Long-term use of this medicine may increase your risk of some cancers. Talk to your doctor about how to limit your risk. What side effects may I notice from receiving this medicine? Side effects that you should report to your doctor or health care professional as soon as possible:  allergic reactions like skin rash, itching or hives, swelling of the face, lips, or tongue  blue lips, nails, or skin  breathing problems  changes in blood pressure  chest pain  confusion  fast, irregular heartbeat  feeling faint or lightheaded, falls  fever or chills  flushing, sweating, or hot feelings  joint or muscle aches or pains  pain, tingling, numbness in the hands or feet  seizures  unusually weak or tired Side effects that usually do not require medical attention (report to your doctor or health care professional if they continue or are bothersome):  change in taste (metallic taste)    diarrhea  headache  irritation at site where injected  nausea, vomiting  stomach upset This list may not describe all possible side effects. Call your doctor for medical advice about side effects. You may report side effects to FDA at 1-800-FDA-1088. Where should I keep my medicine? This drug is given in a hospital or clinic and will not be stored at home. NOTE: This  sheet is a summary. It may not cover all possible information. If you have questions about this medicine, talk to your doctor, pharmacist, or health care provider.  2020 Elsevier/Gold Standard (2007-12-06 16:59:50)  

## 2020-06-18 ENCOUNTER — Ambulatory Visit (INDEPENDENT_AMBULATORY_CARE_PROVIDER_SITE_OTHER): Payer: PPO | Admitting: Pharmacist

## 2020-06-18 DIAGNOSIS — M810 Age-related osteoporosis without current pathological fracture: Secondary | ICD-10-CM | POA: Diagnosis not present

## 2020-06-18 MED ORDER — ALENDRONATE SODIUM 70 MG PO TABS: 70.0000 mg | ORAL_TABLET | ORAL | 11 refills | Status: DC

## 2020-06-18 NOTE — Progress Notes (Signed)
     06/18/2020 Name: Brittany Hensley MRN: [id number] DOB: July 22, 1951   S:  69 year old postmenopausal Caucasian female presents for osteoporosis education and management Personal history of LEFT wrist fracture and BILATERAL hip fractures as an adult. Current history of hypothyroidism. Currently undergoing calcium and vitamin-D supplementation. New baseline examination  Current Height:   5'5"     Max Lifetime Height:  5'5" Current Weight:     102lb    Ethnicity:Caucasian   HPI: Does pt already have a diagnosis of:  Osteopenia?  No Osteoporosis?  Yes , new onset  Back Pain?  No       Kyphosis?  No Prior fracture?  Yes wrist/hip fracture Med(s) for Osteoporosis/Osteopenia:  Calcium ctirate/vitamin D Med(s) previously tried for Osteoporosis/Osteopenia:  n/a                                                             PMH: Age at menopause:  55s Hysterectomy?  No Oophorectomy?  No HRT? No Steroid Use?  No Thyroid med?  Yes History of cancer?  No History of digestive disorders (ie Crohn's)?  Yes , GERD, history of gastrectomy Current or previous eating disorders?  No Last Vitamin D Result:  48.3 Last GFR Result:  55   FH/SH: Family history of osteoporosis?  No Parent with history of hip fracture?  No Family history of breast cancer?  No Exercise?  Yes walking, muscle toning Smoking?  No Alcohol?  No    Calcium Assessment Calcium Intake  # of servings/day  Calcium mg  Milk (8 oz) 0  x  300  = 0  Yogurt (4 oz) 0 x  200 = 0  Cheese (1 oz) 1 x  200 = 200  Other Calcium sources   250mg   Ca supplement 1 = 600   Estimated calcium intake per day 1050mg     FRAX 10 year estimate:  FRAX: World Health Organization FRAX assessment of absolute fracture risk is not calculated for this patient because the patient has osteoporosis.  DEXA Results FINDINGS: AP LUMBAR SPINE (L1-L4) Bone Mineral Density (BMD):  0.906 g/cm2 Young Adult T-Score:  -2.3 Z-Score:  0.0  RIGHT  FOREARM (1/3 RADIUS) Bone Mineral Density (BMD):  0.438 Young Adult T Score:  -5.1 Z Score:  -3.3  Assessment: Patient's diagnostic category is OSTEOPOROSIS by WHO Criteria.  FRACTURE RISK: INCREASED.  Recommendations: 1.  Start alendronate (FOSAMAX) based on cost and patient preference.  Patient to speak with GI about taking fosamax given extensive GI history.  Will explore Prolia cost/benefits if patient unable to take Fosamax. 2.  continue calcium 1200mg  daily through supplementation or diet. Patient taking calcium citrate 1200mg  now.  This formulation works better with her stomach 3.  continue weight bearing exercise - 30 minutes at least 4 days per week.   4.  Counseled and educated about fall risk and prevention.  Recheck DEXA:  2 years  Time spent counseling patient:  20 minutes   Regina Eck, PharmD, BCPS Clinical Pharmacist, [place]  II Phone [phone]

## 2020-07-07 ENCOUNTER — Other Ambulatory Visit: Payer: Self-pay | Admitting: Family

## 2020-07-11 ENCOUNTER — Other Ambulatory Visit: Payer: Self-pay | Admitting: Gastroenterology

## 2020-07-11 DIAGNOSIS — K311 Adult hypertrophic pyloric stenosis: Secondary | ICD-10-CM | POA: Diagnosis not present

## 2020-07-12 ENCOUNTER — Other Ambulatory Visit: Payer: Self-pay

## 2020-07-12 ENCOUNTER — Other Ambulatory Visit (HOSPITAL_COMMUNITY)
Admission: RE | Admit: 2020-07-12 | Discharge: 2020-07-12 | Disposition: A | Discharge status: Home / Self Care | Payer: PPO | Source: Ambulatory Visit | Attending: Gastroenterology | Admitting: Gastroenterology

## 2020-07-12 DIAGNOSIS — Z20822 Contact with and (suspected) exposure to covid-19: Secondary | ICD-10-CM | POA: Diagnosis not present

## 2020-07-12 DIAGNOSIS — Z01812 Encounter for preprocedural laboratory examination: Secondary | ICD-10-CM | POA: Insufficient documentation

## 2020-07-12 LAB — SARS CORONAVIRUS 2 (TAT 6-24 HRS): SARS Coronavirus 2: NEGATIVE

## 2020-07-16 ENCOUNTER — Encounter (HOSPITAL_COMMUNITY)
Admission: RE | Disposition: A | Discharge status: Home / Self Care | Payer: Self-pay | Source: Home / Self Care | Attending: Gastroenterology

## 2020-07-16 ENCOUNTER — Other Ambulatory Visit: Payer: Self-pay

## 2020-07-16 ENCOUNTER — Ambulatory Visit (HOSPITAL_COMMUNITY): Payer: PPO | Admitting: Anesthesiology

## 2020-07-16 ENCOUNTER — Encounter (HOSPITAL_COMMUNITY): Payer: Self-pay | Admitting: Gastroenterology

## 2020-07-16 ENCOUNTER — Ambulatory Visit (HOSPITAL_COMMUNITY)
Admission: RE | Admit: 2020-07-16 | Discharge: 2020-07-16 | Disposition: A | Discharge status: Home / Self Care | Payer: PPO | Attending: Gastroenterology | Admitting: Gastroenterology

## 2020-07-16 DIAGNOSIS — Z96643 Presence of artificial hip joint, bilateral: Secondary | ICD-10-CM | POA: Diagnosis not present

## 2020-07-16 DIAGNOSIS — K222 Esophageal obstruction: Secondary | ICD-10-CM | POA: Diagnosis not present

## 2020-07-16 DIAGNOSIS — Z980 Intestinal bypass and anastomosis status: Secondary | ICD-10-CM | POA: Diagnosis not present

## 2020-07-16 DIAGNOSIS — E43 Unspecified severe protein-calorie malnutrition: Secondary | ICD-10-CM | POA: Diagnosis not present

## 2020-07-16 DIAGNOSIS — K449 Diaphragmatic hernia without obstruction or gangrene: Secondary | ICD-10-CM | POA: Diagnosis not present

## 2020-07-16 DIAGNOSIS — K2950 Unspecified chronic gastritis without bleeding: Secondary | ICD-10-CM | POA: Insufficient documentation

## 2020-07-16 DIAGNOSIS — R1310 Dysphagia, unspecified: Secondary | ICD-10-CM | POA: Insufficient documentation

## 2020-07-16 DIAGNOSIS — Z903 Acquired absence of stomach [part of]: Secondary | ICD-10-CM | POA: Insufficient documentation

## 2020-07-16 DIAGNOSIS — Z79890 Hormone replacement therapy (postmenopausal): Secondary | ICD-10-CM | POA: Diagnosis not present

## 2020-07-16 DIAGNOSIS — K9189 Other postprocedural complications and disorders of digestive system: Secondary | ICD-10-CM | POA: Diagnosis not present

## 2020-07-16 DIAGNOSIS — K3189 Other diseases of stomach and duodenum: Secondary | ICD-10-CM | POA: Diagnosis not present

## 2020-07-16 DIAGNOSIS — Z79899 Other long term (current) drug therapy: Secondary | ICD-10-CM | POA: Insufficient documentation

## 2020-07-16 HISTORY — PX: BALLOON DILATION: SHX5330

## 2020-07-16 HISTORY — PX: ESOPHAGOGASTRODUODENOSCOPY (EGD) WITH PROPOFOL: SHX5813

## 2020-07-16 SURGERY — ESOPHAGOGASTRODUODENOSCOPY (EGD) WITH PROPOFOL
Anesthesia: Monitor Anesthesia Care

## 2020-07-16 MED ORDER — ONDANSETRON HCL 4 MG/2ML IJ SOLN
INTRAMUSCULAR | Status: DC | PRN
  Administered 2020-07-16: 4 mg via INTRAVENOUS

## 2020-07-16 MED ORDER — PROPOFOL 500 MG/50ML IV EMUL
INTRAVENOUS | Status: DC | PRN
  Administered 2020-07-16: 100 ug/kg/min via INTRAVENOUS

## 2020-07-16 MED ORDER — SODIUM CHLORIDE 0.9 % IV SOLN: INTRAVENOUS | Status: DC

## 2020-07-16 MED ORDER — LACTATED RINGERS IV SOLN
INTRAVENOUS | Status: DC | PRN
Start: 2020-07-16 — End: 2020-07-16

## 2020-07-16 MED ORDER — PROPOFOL 500 MG/50ML IV EMUL
INTRAVENOUS | Status: AC
  Filled 2020-07-16: qty 50

## 2020-07-16 MED ORDER — LIDOCAINE 2% (20 MG/ML) 5 ML SYRINGE
INTRAMUSCULAR | Status: DC | PRN
  Administered 2020-07-16: 100 mg via INTRAVENOUS

## 2020-07-16 MED ORDER — PROPOFOL 10 MG/ML IV BOLUS
INTRAVENOUS | Status: DC | PRN
  Administered 2020-07-16 (×4): 20 mg via INTRAVENOUS

## 2020-07-16 MED ORDER — LACTATED RINGERS IV SOLN: Freq: Once | INTRAVENOUS | Status: AC

## 2020-07-16 SURGICAL SUPPLY — 15 items

## 2020-07-16 NOTE — Op Note (Signed)
[facility] Patient Name: Brittany Hensley Procedure Date: 07/16/2020 MRN: [id number] Attending MD: Clarene Essex , MD Date of Birth: Jun 22, 1951 CSN: [id number] Age: 69 Admit Type: Outpatient Procedure:                Upper GI endoscopy Indications:              For therapy of post-surgical anastomotic stenosis Providers:                Clarene Essex, MD, Cleda Daub, RN, Lesia Sago, Technician, Danley Danker, CRNA Referring MD:              Medicines:                Propofol total dose 130 mg IV, 100 mg IV lidocaine Complications:            No immediate complications. Estimated Blood Loss:     Estimated blood loss: none. Procedure:                Pre-Anesthesia Assessment:                           - Prior to the procedure, a History and Physical                            was performed, and patient medications and                            allergies were reviewed. The patient's tolerance of                            previous anesthesia was also reviewed. The risks                            and benefits of the procedure and the sedation                            options and risks were discussed with the patient.                            All questions were answered, and informed consent                            was obtained. Prior Anticoagulants: The patient has                            taken no previous anticoagulant or antiplatelet                            agents. ASA Grade Assessment: II - A patient with                            mild systemic disease. After reviewing the risks  and benefits, the patient was deemed in                            satisfactory condition to undergo the procedure.                           After obtaining informed consent, the endoscope was                            passed under direct vision. Throughout the                            procedure, the patient's blood  pressure, pulse, and                            oxygen saturations were monitored continuously. The                            GIF-H190 ([id number]) Olympus gastroscope was                            introduced through the mouth, and advanced to the                            jejunum. The upper GI endoscopy was accomplished                            without difficulty. The patient tolerated the                            procedure well. Scope In: Scope Out: Findings:      The larynx was normal.      A small hiatal hernia was present.      One benign-appearing, intrinsic mild stenosis was found. The stenosis       was traversed.      Diffuse mild inflammation characterized by erythema was found in the       entire examined stomach.      Evidence of a patent Billroth I gastroduodenostomy was found. A gastric       pouch with a normal size was found. The gastroduodenal anastomosis was       characterized by healthy appearing mucosa. This was traversed. A TTS       dilator was passed through the scope. Dilation with an 18-19-20 mm       balloon dilator was performed to 20 mm. The dilation site was examined       and showed mild mucosal disruption and mild improvement in luminal       narrowing.      The examined jejunum was normal.      The exam was otherwise without abnormality. Impression:               - Normal larynx.                           - Small hiatal hernia.                           -  Benign-appearing esophageal stenosis.                           - Chronic gastritis.                           - Patent Billroth I gastroduodenostomy was found,                            characterized by healthy appearing mucosa. Dilated.                           - Normal examined jejunum.                           - The examination was otherwise normal.                           - No specimens collected. Moderate Sedation:      Not Applicable - Patient had care per  Anesthesia. Recommendation:           - Patient has a contact number available for                            emergencies. The signs and symptoms of potential                            delayed complications were discussed with the                            patient. Return to normal activities tomorrow.                            Written discharge instructions were provided to the                            patient.                           - Soft diet today.                           - Continue present medications.                           - Return to GI clinic PRN.                           - Telephone GI clinic if symptomatic PRN.                           - Repeat upper endoscopy PRN for retreatment. Procedure Code(s):        --- Professional ---                           [phone], Esophagogastroduodenoscopy, flexible,  transoral; with dilation of gastric/duodenal                            stricture(s) (eg, balloon, bougie) Diagnosis Code(s):        --- Professional ---                           K44.9, Diaphragmatic hernia without obstruction or                            gangrene                           K22.2, Esophageal obstruction                           K29.50, Unspecified chronic gastritis without                            bleeding                           Z98.0, Intestinal bypass and anastomosis status                           K91.89, Other postprocedural complications and                            disorders of digestive system CPT copyright 2019 American Medical Association. All rights reserved. The codes documented in this report are preliminary and upon coder review may  be revised to meet current compliance requirements. Clarene Essex, MD 07/16/2020 10:41:34 AM This report has been signed electronically. Number of Addenda: 0

## 2020-07-16 NOTE — Discharge Instructions (Signed)
YOU HAD AN ENDOSCOPIC PROCEDURE TODAY: Refer to the procedure report and other information in the discharge instructions given to you for any specific questions about what was found during the examination. If this information does not answer your questions, please call [facility] office at [phone] to clarify.   YOU SHOULD EXPECT: Some feelings of bloating in the abdomen. Passage of more gas than usual. Walking can help get rid of the air that was put into your GI tract during the procedure and reduce the bloating. If you had a lower endoscopy (such as a colonoscopy or flexible sigmoidoscopy) you may notice spotting of blood in your stool or on the toilet paper. Some abdominal soreness may be present for a day or two, also.  DIET: Your first meal following the procedure should be a light meal and then it is ok to progress to your normal diet. A half-sandwich or bowl of soup is an example of a good first meal. Heavy or fried foods are harder to digest and may make you feel nauseous or bloated. Drink plenty of fluids but you should avoid alcoholic beverages for 24 hours. If you had a esophageal dilation, please see attached instructions for diet.    ACTIVITY: Your care partner should take you home directly after the procedure. You should plan to take it easy, moving slowly for the rest of the day. You can resume normal activity the day after the procedure however YOU SHOULD NOT DRIVE, use power tools, machinery or perform tasks that involve climbing or major physical exertion for 24 hours (because of the sedation medicines used during the test).   SYMPTOMS TO REPORT IMMEDIATELY: A gastroenterologist can be reached at any hour. Please call [phone]  for any of the following symptoms:  . Following lower endoscopy (colonoscopy, flexible sigmoidoscopy) Excessive amounts of blood in the stool  Significant tenderness, worsening of abdominal pains  Swelling of the abdomen that is new, acute  Fever of 100  or higher  . Following upper endoscopy (EGD, EUS, ERCP, esophageal dilation) Vomiting of blood or coffee ground material  New, significant abdominal pain  New, significant chest pain or pain under the shoulder blades  Painful or persistently difficult swallowing  New shortness of breath  Black, tarry-looking or red, bloody stools  FOLLOW UP:  If any biopsies were taken you will be contacted by phone or by letter within the next 1-3 weeks. Call [phone]  if you have not heard about the biopsies in 3 weeks.  Please also call with any specific questions about appointments or follow up tests. Soft solid diet slowly advance as tolerated call if question or problem and follow-up as needed

## 2020-07-16 NOTE — Transfer of Care (Signed)
Immediate Anesthesia Transfer of Care Note  Patient: Brittany Hensley  Procedure(s) Performed: ESOPHAGOGASTRODUODENOSCOPY (EGD) WITH PROPOFOL (N/A ) BALLOON DILATION (N/A )  Patient Location: Endoscopy Unit  Anesthesia Type:MAC  Level of Consciousness: awake and alert   Airway & Oxygen Therapy: Patient Spontanous Breathing and Patient connected to face mask oxygen  Post-op Assessment: Report given to RN and Post -op Vital signs reviewed and stable  Post vital signs: Reviewed and stable  Last Vitals:  Vitals Value Taken Time  BP    Temp    Pulse    Resp    SpO2      Last Pain:  Vitals:   07/16/20 0933  TempSrc: Oral  PainSc: 0-No pain         Complications: No complications documented.

## 2020-07-16 NOTE — Anesthesia Postprocedure Evaluation (Signed)
Anesthesia Post Note  Patient: Johany Hansman [place]  Procedure(s) Performed: ESOPHAGOGASTRODUODENOSCOPY (EGD) WITH PROPOFOL (N/A ) BALLOON DILATION (N/A )     Patient location during evaluation: Endoscopy Anesthesia Type: MAC Level of consciousness: awake Pain management: pain level controlled Vital Signs Assessment: post-procedure vital signs reviewed and stable Respiratory status: spontaneous breathing, nonlabored ventilation, respiratory function stable and patient connected to nasal cannula oxygen Cardiovascular status: stable and blood pressure returned to baseline Postop Assessment: no apparent nausea or vomiting Anesthetic complications: no   No complications documented.  Last Vitals:  Vitals:   07/16/20 1050 07/16/20 1100  BP: (!) 90/55 (!) 107/56  Pulse: 81 79  Resp: 17 16  Temp:    SpO2: 99% 98%    Last Pain:  Vitals:   07/16/20 1100  TempSrc:   PainSc: 0-No pain                 Lealand Elting P Willadene Mounsey

## 2020-07-16 NOTE — Anesthesia Procedure Notes (Signed)
Date/Time: 07/16/2020 10:12 AM Performed by: Sharlette Dense, CRNA Oxygen Delivery Method: Simple face mask

## 2020-07-16 NOTE — Anesthesia Preprocedure Evaluation (Addendum)
Anesthesia Evaluation  Patient identified by MRN, date of birth, ID band Patient awake    Reviewed: Allergy & Precautions, NPO status , Patient's Chart, lab work & pertinent test results  Airway Mallampati: I  TM Distance: >3 FB Neck ROM: Full    Dental no notable dental hx.    Pulmonary neg pulmonary ROS,    Pulmonary exam normal breath sounds clear to auscultation       Cardiovascular negative cardio ROS Normal cardiovascular exam Rhythm:Regular Rate:Normal     Neuro/Psych PSYCHIATRIC DISORDERS Anxiety negative neurological ROS     GI/Hepatic Neg liver ROS, GERD  Medicated and Controlled,  Endo/Other  Hypothyroidism   Renal/GU negative Renal ROS     Musculoskeletal negative musculoskeletal ROS (+)   Abdominal   Peds  Hematology negative hematology ROS (+)   Anesthesia Other Findings Oral phase dysphagia, Gastric outlet obstruction  Reproductive/Obstetrics                            Anesthesia Physical Anesthesia Plan  ASA: II  Anesthesia Plan: MAC   Post-op Pain Management:    Induction: Intravenous  PONV Risk Score and Plan: 2 and Ondansetron, Propofol infusion and Treatment may vary due to age or medical condition  Airway Management Planned: Nasal Cannula  Additional Equipment:   Intra-op Plan:   Post-operative Plan:   Informed Consent: I have reviewed the patients History and Physical, chart, labs and discussed the procedure including the risks, benefits and alternatives for the proposed anesthesia with the patient or authorized representative who has indicated his/her understanding and acceptance.     Dental advisory given  Plan Discussed with: CRNA  Anesthesia Plan Comments:         Anesthesia Quick Evaluation

## 2020-07-16 NOTE — Progress Notes (Signed)
Marchell Froman Spurrier 9:42 AM  Subjective: Patient without any new complaints since she was seen recently in our office and is currently hungry and we rediscussed the procedure  Objective: Vital signs stable afebrile exam please see preassessment evaluation recent labs reviewed  Assessment: Anastomotic stricture help with dilation in the past  Plan: Okay to proceed with endoscopy probable balloon dilation with anesthesia assistance  [facility]  office [phone] After 5PM or if no answer call [phone]

## 2020-08-25 ENCOUNTER — Other Ambulatory Visit: Payer: Self-pay | Admitting: Nurse Practitioner

## 2020-08-25 DIAGNOSIS — D500 Iron deficiency anemia secondary to blood loss (chronic): Secondary | ICD-10-CM

## 2020-09-10 ENCOUNTER — Encounter: Payer: Self-pay | Admitting: Emergency Medicine

## 2020-09-10 ENCOUNTER — Ambulatory Visit
Admission: EM | Admit: 2020-09-10 | Discharge: 2020-09-10 | Disposition: A | Discharge status: Home / Self Care | Payer: PPO | Attending: Emergency Medicine | Admitting: Emergency Medicine

## 2020-09-10 ENCOUNTER — Other Ambulatory Visit: Payer: Self-pay

## 2020-09-10 ENCOUNTER — Ambulatory Visit (INDEPENDENT_AMBULATORY_CARE_PROVIDER_SITE_OTHER): Payer: PPO

## 2020-09-10 ENCOUNTER — Ambulatory Visit: Payer: Self-pay

## 2020-09-10 DIAGNOSIS — R102 Pelvic and perineal pain: Secondary | ICD-10-CM

## 2020-09-10 DIAGNOSIS — M818 Other osteoporosis without current pathological fracture: Secondary | ICD-10-CM | POA: Diagnosis not present

## 2020-09-10 DIAGNOSIS — M25551 Pain in right hip: Secondary | ICD-10-CM

## 2020-09-10 DIAGNOSIS — M1611 Unilateral primary osteoarthritis, right hip: Secondary | ICD-10-CM | POA: Diagnosis not present

## 2020-09-10 NOTE — ED Provider Notes (Signed)
[place]   [id number] 09/10/20 Arrival Time: 0160   Chief Complaint  Patient presents with  . Hip Pain     SUBJECTIVE: History from: patient.  Brittany Hensley is a 70 y.o. female with history of bilateral hip replacement presented to the urgent care with a complaint of right hip pain with movement for the past 4 days.  Denies any precipitating event, trauma or injury.  Localized pain to the right hip.  She describes the pain as constant and achy.  She has tried OTC medications without relief.  Her symptoms are made worse with ROM.  She denies similar symptoms in the past.  Denies chills, fever, nausea, vomiting, diarrhea, LOC, confusion.   ROS: As per HPI.  All other pertinent ROS negative.       Past Medical History:  Diagnosis Date  . Anemia    iron   . Anxiety   . Diverticulitis    Hx: of  . GERD (gastroesophageal reflux disease)    at times  . History of kidney stones    10 years ago  . Hypothyroidism   . Low iron   . Pneumonia   . Rosacea    Hx: of   Past Surgical History:  Procedure Laterality Date  . APPENDECTOMY    . BALLOON DILATION  06/16/2011   Procedure: BALLOON DILATION;  Surgeon: Jeryl Columbia, MD;  Location: [facility] ENDOSCOPY;  Service: Endoscopy;  Laterality: N/A;  . BALLOON DILATION N/A 10/03/2013   Procedure: BALLOON DILATION;  Surgeon: Jeryl Columbia, MD;  Location: [facility] ENDOSCOPY;  Service: Endoscopy;  Laterality: N/A;  . BALLOON DILATION N/A 08/10/2014   Procedure: BALLOON DILATION;  Surgeon: Jeryl Columbia, MD;  Location: [facility] ENDOSCOPY;  Service: Endoscopy;  Laterality: N/A;  from 12 - 15 cm dilation completed  . BALLOON DILATION N/A 02/15/2015   Procedure: BALLOON DILATION;  Surgeon: Clarene Essex, MD;  Location: [facility] ENDOSCOPY;  Service: Endoscopy;  Laterality: N/A;  . BALLOON DILATION N/A 11/12/2015   Procedure: BALLOON DILATION;  Surgeon: Clarene Essex, MD;  Location: [facility] ENDOSCOPY;  Service: Endoscopy;  Laterality: N/A;  . BALLOON DILATION N/A 08/10/2016    Procedure: BALLOON DILATION;  Surgeon: Clarene Essex, MD;  Location: [facility] ENDOSCOPY;  Service: Endoscopy;  Laterality: N/A;  . BALLOON DILATION N/A 08/07/2016   Procedure: BALLOON DILATION;  Surgeon: Clarene Essex, MD;  Location: [facility] ENDOSCOPY;  Service: Endoscopy;  Laterality: N/A;  . BALLOON DILATION N/A 05/20/2017   Procedure: BALLOON DILATION;  Surgeon: Clarene Essex, MD;  Location: [facility] ENDOSCOPY;  Service: Endoscopy;  Laterality: N/A;  . BALLOON DILATION N/A 12/02/2017   Procedure: BALLOON DILATION;  Surgeon: Clarene Essex, MD;  Location: [place];  Service: Endoscopy;  Laterality: N/A;  . BALLOON DILATION N/A 07/19/2018   Procedure: BALLOON DILATION;  Surgeon: Clarene Essex, MD;  Location: [facility] ENDOSCOPY;  Service: Endoscopy;  Laterality: N/A;  . BALLOON DILATION N/A 01/23/2019   Procedure: BALLOON DILATION;  Surgeon: Clarene Essex, MD;  Location: [facility] ENDOSCOPY;  Service: Endoscopy;  Laterality: N/A;  . BALLOON DILATION N/A 07/13/2019   Procedure: BALLOON DILATION;  Surgeon: Clarene Essex, MD;  Location: [facility] ENDOSCOPY;  Service: Endoscopy;  Laterality: N/A;  . BALLOON DILATION N/A 02/02/2020   Procedure: BALLOON DILATION;  Surgeon: Clarene Essex, MD;  Location: [facility] ENDOSCOPY;  Service: Endoscopy;  Laterality: N/A;  . BALLOON DILATION N/A 07/16/2020   Procedure: BALLOON DILATION;  Surgeon: Clarene Essex, MD;  Location: [facility] ENDOSCOPY;  Service: Endoscopy;  Laterality: N/A;  .  CATARACT EXTRACTION, BILATERAL    . CHOLECYSTECTOMY OPEN  1979  . COLONOSCOPY     Hx: of  . ESOPHAGOGASTRODUODENOSCOPY  06/16/2011   Procedure: ESOPHAGOGASTRODUODENOSCOPY (EGD);  Surgeon: Jeryl Columbia, MD;  Location: [facility] ENDOSCOPY;  Service: Endoscopy;  Laterality: N/A;  . ESOPHAGOGASTRODUODENOSCOPY N/A 10/03/2013   Procedure: ESOPHAGOGASTRODUODENOSCOPY (EGD);  Surgeon: Jeryl Columbia, MD;  Location: [facility] ENDOSCOPY;  Service: Endoscopy;  Laterality: N/A;  . ESOPHAGOGASTRODUODENOSCOPY N/A 08/10/2014   Procedure: ESOPHAGOGASTRODUODENOSCOPY (EGD);   Surgeon: Jeryl Columbia, MD;  Location: [facility] ENDOSCOPY;  Service: Endoscopy;  Laterality: N/A;  . ESOPHAGOGASTRODUODENOSCOPY N/A 08/10/2016   Procedure: ESOPHAGOGASTRODUODENOSCOPY (EGD);  Surgeon: Clarene Essex, MD;  Location: [facility] ENDOSCOPY;  Service: Endoscopy;  Laterality: N/A;  . ESOPHAGOGASTRODUODENOSCOPY N/A 07/19/2018   Procedure: ESOPHAGOGASTRODUODENOSCOPY (EGD);  Surgeon: Clarene Essex, MD;  Location: [facility] ENDOSCOPY;  Service: Endoscopy;  Laterality: N/A;  . ESOPHAGOGASTRODUODENOSCOPY (EGD) WITH PROPOFOL N/A 02/15/2015   Procedure: ESOPHAGOGASTRODUODENOSCOPY (EGD) WITH PROPOFOL;  Surgeon: Clarene Essex, MD;  Location: [facility] ENDOSCOPY;  Service: Endoscopy;  Laterality: N/A;  . ESOPHAGOGASTRODUODENOSCOPY (EGD) WITH PROPOFOL N/A 11/12/2015   Procedure: ESOPHAGOGASTRODUODENOSCOPY (EGD) WITH PROPOFOL;  Surgeon: Clarene Essex, MD;  Location: [facility] ENDOSCOPY;  Service: Endoscopy;  Laterality: N/A;  . ESOPHAGOGASTRODUODENOSCOPY (EGD) WITH PROPOFOL N/A 08/07/2016   Procedure: ESOPHAGOGASTRODUODENOSCOPY (EGD) WITH PROPOFOL;  Surgeon: Clarene Essex, MD;  Location: [facility] ENDOSCOPY;  Service: Endoscopy;  Laterality: N/A;  . ESOPHAGOGASTRODUODENOSCOPY (EGD) WITH PROPOFOL N/A 05/20/2017   Procedure: ESOPHAGOGASTRODUODENOSCOPY (EGD) WITH PROPOFOL;  Surgeon: Clarene Essex, MD;  Location: [facility] ENDOSCOPY;  Service: Endoscopy;  Laterality: N/A;  . ESOPHAGOGASTRODUODENOSCOPY (EGD) WITH PROPOFOL N/A 12/02/2017   Procedure: ESOPHAGOGASTRODUODENOSCOPY (EGD) WITH PROPOFOL;  Surgeon: Clarene Essex, MD;  Location: [place];  Service: Endoscopy;  Laterality: N/A;  have c arm available  . ESOPHAGOGASTRODUODENOSCOPY (EGD) WITH PROPOFOL N/A 01/23/2019   Procedure: ESOPHAGOGASTRODUODENOSCOPY (EGD) WITH PROPOFOL;  Surgeon: Clarene Essex, MD;  Location: [facility] ENDOSCOPY;  Service: Endoscopy;  Laterality: N/A;  . ESOPHAGOGASTRODUODENOSCOPY (EGD) WITH PROPOFOL N/A 07/13/2019   Procedure: ESOPHAGOGASTRODUODENOSCOPY (EGD) WITH PROPOFOL;  Surgeon: Clarene Essex, MD;  Location:  [facility] ENDOSCOPY;  Service: Endoscopy;  Laterality: N/A;  . ESOPHAGOGASTRODUODENOSCOPY (EGD) WITH PROPOFOL N/A 02/02/2020   Procedure: ESOPHAGOGASTRODUODENOSCOPY (EGD) WITH PROPOFOL;  Surgeon: Clarene Essex, MD;  Location: [facility] ENDOSCOPY;  Service: Endoscopy;  Laterality: N/A;  . ESOPHAGOGASTRODUODENOSCOPY (EGD) WITH PROPOFOL N/A 07/16/2020   Procedure: ESOPHAGOGASTRODUODENOSCOPY (EGD) WITH PROPOFOL;  Surgeon: Clarene Essex, MD;  Location: [facility] ENDOSCOPY;  Service: Endoscopy;  Laterality: N/A;  . FOREIGN BODY REMOVAL  07/19/2018   Procedure: FOREIGN BODY REMOVAL;  Surgeon: Clarene Essex, MD;  Location: [facility] ENDOSCOPY;  Service: Endoscopy;;  . FRACTURE SURGERY     Hx: of left wrist  . GASTRECTOMY  [phone]  . GASTRECTOMY N/A    X3  . LITHOTRIPSY     Hx; of for kidney stones  . SAVORY DILATION N/A 08/10/2014   Procedure: SAVORY DILATION;  Surgeon: Jeryl Columbia, MD;  Location: [facility] ENDOSCOPY;  Service: Endoscopy;  Laterality: N/A;  . TOTAL HIP ARTHROPLASTY Right 12/30/2017   Procedure: RIGHT TOTAL HIP ARTHROPLASTY;  Surgeon: Carole Civil, MD;  Location: [facility];  Service: Orthopedics;  Laterality: Right;  . TOTAL HIP ARTHROPLASTY Left 02/17/2018   Procedure: TOTAL HIP ARTHROPLASTY;  Surgeon: Carole Civil, MD;  Location: [facility];  Service: Orthopedics;  Laterality: Left;  . TUBAL LIGATION     1975   Allergies  Allergen Reactions  . Augmentin [Amoxicillin-Pot Clavulanate] Other (See Comments)    Bloody  stool Did it involve swelling of the face/tongue/throat, SOB, or low BP? No Did it involve sudden or severe rash/hives, skin peeling, or any reaction on the inside of your mouth or nose? No Did you need to seek medical attention at a hospital or doctor's office? No When did it last happen?1 year If all above answers are "NO", may proceed with cephalosporin use.   . Famotidine Other (See Comments)    Fever   . Klonopin [Clonazepam] Other (See Comments)    double vision  . Nitrofurantoin  Rash  . Reglan [Metoclopramide] Anxiety and Other (See Comments)    Causes confusion  . Sulfamethoxazole-Trimethoprim Rash  . Voltaren [Diclofenac Sodium] Anxiety    Topical made nervous   No current facility-administered medications on file prior to encounter.   Current Outpatient Medications on File Prior to Encounter  Medication Sig Dispense Refill  . acetaminophen (TYLENOL) 500 MG tablet Take 500 mg by mouth 2 (two) times daily.     Marland Kitchen albuterol (VENTOLIN HFA) 108 (90 Base) MCG/ACT inhaler Inhale 2 puffs into the lungs every 6 (six) hours as needed for wheezing or shortness of breath. 18 g 2  . alendronate (FOSAMAX) 70 MG tablet Take 1 tablet (70 mg total) by mouth every 7 (seven) days. Take with a full glass of water on an empty stomach. 4 tablet 11  . ALPRAZolam (XANAX) 0.5 MG tablet Take 1 tablet (0.5 mg total) by mouth 4 (four) times daily as needed for anxiety. 120 tablet 5  . Ascorbic Acid (VITAMIN C) 1000 MG tablet Take 1,000 mg by mouth in the morning and at bedtime.     Marland Kitchen b complex vitamins capsule Take 1 capsule by mouth daily.    . Biotin 10 MG CAPS Take 10 mg by mouth daily.    Marland Kitchen BLACK ELDERBERRY PO Take 50 mg by mouth in the morning and at bedtime.    . Calcium Carbonate (CALCIUM 600 PO) Take 600 mg by mouth 2 (two) times daily.     . Cholecalciferol (VITAMIN D) 2000 units tablet Take 2,000 Units by mouth 2 (two) times daily.    Marland Kitchen docusate sodium (COLACE) 100 MG capsule Take 1 capsule (100 mg total) by mouth 2 (two) times daily. (Patient taking differently: Take 100 mg by mouth daily.) 10 capsule 0  . Flaxseed, Linseed, (FLAXSEED OIL) 1200 MG CAPS Take 1,200 mg by mouth daily.     . folic acid (FOLVITE) 409 MCG tablet Take 1 tablet (800 mcg total) by mouth daily. 90 tablet 1  . levothyroxine (SYNTHROID) 100 MCG tablet Take 1 tablet (100 mcg total) by mouth every morning. 90 tablet 1  . loratadine (CLARITIN) 10 MG tablet Take 1 tablet (10 mg total) by mouth daily. 90 tablet 3   . magnesium gluconate (MAGONATE) 500 MG tablet Take 500 mg by mouth daily.    . Multiple Vitamins-Minerals (CENTRUM SILVER) CHEW Chew 1 tablet by mouth 2 (two) times daily.     Marland Kitchen omeprazole (PRILOSEC) 40 MG capsule Take 1 capsule (40 mg total) by mouth daily. 90 capsule 1  . ondansetron (ZOFRAN-ODT) 4 MG disintegrating tablet TAKE 1 TABLET EVERY 8 HOURS AS NEEDED FOR NAUSEA AND VOMITING (Patient taking differently: Take 4 mg by mouth every 8 (eight) hours as needed for nausea or vomiting.) 30 tablet 1  . Polyethyl Glycol-Propyl Glycol (SYSTANE OP) Place 1 drop into both eyes daily as needed (dry eyes).    . Probiotic Product (PROBIOTIC PO) Take 1 capsule  by mouth daily.     . sertraline (ZOLOFT) 100 MG tablet Take 2 tablets (200 mg total) by mouth daily. (Patient taking differently: Take 100 mg by mouth daily.) 180 tablet 1  . sucralfate (CARAFATE) 1 g tablet TAKE ONE TABLET THREE TIMES A DAY WITH MEALS 90 tablet 2  . Turmeric Curcumin 500 MG CAPS Take 500 mg by mouth 2 (two) times a day.     Social History   Socioeconomic History  . Marital status: Widowed    Spouse name: Not on file  . Number of children: Not on file  . Years of education: Not on file  . Highest education level: Not on file  Occupational History  . Not on file  Tobacco Use  . Smoking status: Never Smoker  . Smokeless tobacco: Never Used  Vaping Use  . Vaping Use: Never used  Substance and Sexual Activity  . Alcohol use: No  . Drug use: No  . Sexual activity: Not Currently  Other Topics Concern  . Not on file  Social History Narrative  . Not on file   Social Determinants of Health   Financial Resource Strain: Not on file  Food Insecurity: Not on file  Transportation Needs: Not on file  Physical Activity: Not on file  Stress: Not on file  Social Connections: Not on file  Intimate Partner Violence: Not on file   Family History  Problem Relation Age of Onset  . Diabetes Mother   . Cancer - Prostate  Brother     OBJECTIVE:  Vitals:   09/10/20 1232 09/10/20 1233  BP:  (!) 143/72  Pulse:  92  Resp:  17  Temp:  98.1 F (36.7 C)  TempSrc:  Oral  SpO2:  97%  Weight: 102 lb (46.3 kg)   Height: 5\' 5"  (1.651 m)      Physical Exam Vitals and nursing note reviewed.  Constitutional:      General: She is not in acute distress.    Appearance: Normal appearance. She is normal weight. She is not ill-appearing, toxic-appearing or diaphoretic.  HENT:     Head: Normocephalic.  Cardiovascular:     Rate and Rhythm: Normal rate and regular rhythm.     Pulses: Normal pulses.     Heart sounds: Normal heart sounds. No murmur heard. No friction rub. No gallop.   Pulmonary:     Effort: Pulmonary effort is normal. No respiratory distress.     Breath sounds: Normal breath sounds. No stridor. No wheezing, rhonchi or rales.  Chest:     Chest wall: No tenderness.  Musculoskeletal:        General: Tenderness present.     Comments: Right hip is without any obvious asymmetry or deformity compared to the left knee.  There is no ecchymosis, open wound, lesion, warmth, swelling present.  Patient is able to ambulate and bear weight with pain.  Limited range of motion.  Neurovascular status intact.  Neurological:     Mental Status: She is alert and oriented to person, place, and time.     LABS:  No results found for this or any previous visit (from the past 24 hour(s)).   RADIOLOGY     DG Hip Unilat W or Wo Pelvis 2-3 Views Right  Result Date: 09/10/2020 CLINICAL DATA:  Pain EXAM: DG HIP (WITH OR WITHOUT PELVIS) 2-3V RIGHT COMPARISON:  Right hip radiographs Dec 27, 2019 FINDINGS: Frontal pelvis as well as frontal and lateral right hip images obtained. There  are total hip replacements bilaterally with prosthetic components well-seated. There is no appreciable acute fracture or dislocation. Joint spaces appear unremarkable. Periarticular osteoporosis noted. There is apparent calcification lateral to  the total hip replacement on the left which may represent localized myositis ossificans. IMPRESSION: Total hip replacements bilaterally with prosthetic components well-seated. Periarticular osteoporosis noted. No acute fracture or dislocation. Question a degree of myositis ossificans lateral to the left hip joint. Electronically Signed   By: Lowella Grip III M.D.   On: 09/10/2020 13:07   X-ray is negative for bony abnormality including fracture or dislocation.  I have reviewed the x-ray myself and the radiologist interpretation.  I am in agreement with the radiologist interpretation.  ASSESSMENT & PLAN:  1. Right hip pain   2. Other osteoporosis, unspecified pathological fracture presence     No orders of the defined types were placed in this encounter.    Discharge Instructions  Rest and ice to help with pain Take OTC Tylenol arthritis as needed for pain Follow-up with orthopedic for further reevaluation Return or go to ED if you develop any new or worsening of your symptoms  Reviewed expectations re: course of current medical issues. Questions answered. Outlined signs and symptoms indicating need for more acute intervention. Patient verbalized understanding. After Visit Summary given.         Emerson Monte, FNP 09/10/20 1337

## 2020-09-10 NOTE — ED Triage Notes (Signed)
RT hip pain with movement since Thursday or Friday. Had double hip replacement x 2 years ago. Denies any injury but did not have injuries when her hips were broken.

## 2020-09-10 NOTE — Discharge Instructions (Addendum)
Rest and ice to help with pain Take OTC Tylenol arthritis as needed for pain Follow-up with orthopedic for further reevaluation Return or go to ED if you develop any new or worsening of your symptoms

## 2020-09-16 ENCOUNTER — Other Ambulatory Visit: Payer: Self-pay

## 2020-09-16 ENCOUNTER — Ambulatory Visit: Payer: PPO

## 2020-09-16 ENCOUNTER — Encounter: Payer: Self-pay | Admitting: Orthopedic Surgery

## 2020-09-16 ENCOUNTER — Ambulatory Visit (INDEPENDENT_AMBULATORY_CARE_PROVIDER_SITE_OTHER): Payer: PPO | Admitting: Orthopedic Surgery

## 2020-09-16 VITALS — BP 155/82 | HR 101 | Ht 65.0 in | Wt 102.0 lb

## 2020-09-16 DIAGNOSIS — M25551 Pain in right hip: Secondary | ICD-10-CM

## 2020-09-16 DIAGNOSIS — M545 Low back pain, unspecified: Secondary | ICD-10-CM

## 2020-09-16 DIAGNOSIS — M5136 Other intervertebral disc degeneration, lumbar region: Secondary | ICD-10-CM | POA: Diagnosis not present

## 2020-09-16 NOTE — Progress Notes (Signed)
Chief Complaint  Patient presents with  . Hip Pain    Right x a little over a week   . Back Pain    Right gluteal / pain, right low back states urgent care x rayed hip told her was inflammed     70 year old female had 2 hip replacements for insufficiency fractures in May and July 2019 presents with right hip pain which she says began after she took some boxes to [place]  She went to urgent care they told her to apply ice and take Tylenol which she did and her symptoms are getting better  Review of systems pain is nonradicular in nature seems to be on the right lateral side of the hip posterior to the trochanter denies any back pain  Past Medical History:  Diagnosis Date  . Anemia    iron   . Anxiety   . Diverticulitis    Hx: of  . GERD (gastroesophageal reflux disease)    at times  . History of kidney stones    10 years ago  . Hypothyroidism   . Low iron   . Pneumonia   . Rosacea    Hx: of   BP (!) 155/82   Pulse (!) 101   Ht 5\' 5"  (1.651 m)   Wt 102 lb (46.3 kg)   BMI 16.97 kg/m   Physical Exam Constitutional:      General: She is not in acute distress.    Appearance: She is well-developed.     Comments: Well developed, well nourished Normal grooming and hygiene     Cardiovascular:     Comments: No peripheral edema Musculoskeletal:     Right hip: No deformity, lacerations, tenderness, bony tenderness or crepitus. Normal range of motion. Normal strength.     Left hip: Normal.  Skin:    General: Skin is warm and dry.  Neurological:     Mental Status: She is alert and oriented to person, place, and time.     Sensory: No sensory deficit.     Coordination: Coordination normal.     Gait: Gait normal.     Deep Tendon Reflexes: Reflexes are normal and symmetric.  Psychiatric:        Mood and Affect: Mood normal.        Behavior: Behavior normal.        Thought Content: Thought content normal.        Judgment: Judgment normal.     Comments: Affect normal       Internal images of the lumbar spine show L5-S1 degenerative disc disease L4-5 spondylolisthesis grade 1 coronal plane abnormality as well  Impression unexplained acute pain right hip  Continue ice and Tylenol  Check hip in 4 weeks

## 2020-09-30 ENCOUNTER — Inpatient Hospital Stay: Payer: PPO | Attending: Oncology

## 2020-09-30 ENCOUNTER — Other Ambulatory Visit: Payer: Self-pay

## 2020-09-30 ENCOUNTER — Telehealth: Payer: Self-pay

## 2020-09-30 ENCOUNTER — Other Ambulatory Visit: Payer: Self-pay | Admitting: Nurse Practitioner

## 2020-09-30 DIAGNOSIS — D500 Iron deficiency anemia secondary to blood loss (chronic): Secondary | ICD-10-CM | POA: Diagnosis not present

## 2020-09-30 DIAGNOSIS — K922 Gastrointestinal hemorrhage, unspecified: Secondary | ICD-10-CM | POA: Insufficient documentation

## 2020-09-30 LAB — CBC WITH DIFFERENTIAL (CANCER CENTER ONLY)
Abs Immature Granulocytes: 0.01 10*3/uL (ref 0.00–0.07)
Basophils Absolute: 0 10*3/uL (ref 0.0–0.1)
Basophils Relative: 1 %
Eosinophils Absolute: 0.2 10*3/uL (ref 0.0–0.5)
Eosinophils Relative: 4 %
HCT: 40.5 % (ref 36.0–46.0)
Hemoglobin: 12.8 g/dL (ref 12.0–15.0)
Immature Granulocytes: 0 %
Lymphocytes Relative: 31 %
Lymphs Abs: 1.9 10*3/uL (ref 0.7–4.0)
MCH: 34 pg (ref 26.0–34.0)
MCHC: 31.6 g/dL (ref 30.0–36.0)
MCV: 107.4 fL — ABNORMAL HIGH (ref 80.0–100.0)
Monocytes Absolute: 0.8 10*3/uL (ref 0.1–1.0)
Monocytes Relative: 12 %
Neutro Abs: 3.1 10*3/uL (ref 1.7–7.7)
Neutrophils Relative %: 52 %
Platelet Count: 234 10*3/uL (ref 150–400)
RBC: 3.77 MIL/uL — ABNORMAL LOW (ref 3.87–5.11)
RDW: 13.2 % (ref 11.5–15.5)
WBC Count: 6.1 10*3/uL (ref 4.0–10.5)
nRBC: 0 % (ref 0.0–0.2)

## 2020-09-30 LAB — FERRITIN: Ferritin: 222 ng/mL (ref 11–307)

## 2020-09-30 NOTE — Telephone Encounter (Signed)
-----   Message from Ladell Pier, MD sent at 09/30/2020  1:26 PM EST ----- Please call patient, iron level and hemoglobin look good, follow-up as scheduled

## 2020-09-30 NOTE — Telephone Encounter (Signed)
Spoke with pt made aware of most recent lab results iron and HGB  Pt asks labs be mailed to her   Pt also mentions she would like to continue lab only appts here at [place] long but provider appts at [place] with Dr. Benay Spice

## 2020-10-09 ENCOUNTER — Other Ambulatory Visit: Payer: Self-pay | Admitting: Nurse Practitioner

## 2020-10-14 ENCOUNTER — Other Ambulatory Visit: Payer: Self-pay

## 2020-10-14 ENCOUNTER — Ambulatory Visit (INDEPENDENT_AMBULATORY_CARE_PROVIDER_SITE_OTHER): Payer: PPO | Admitting: Orthopedic Surgery

## 2020-10-14 VITALS — BP 124/76 | HR 72 | Ht 65.0 in | Wt 102.0 lb

## 2020-10-14 DIAGNOSIS — Z96641 Presence of right artificial hip joint: Secondary | ICD-10-CM

## 2020-10-14 DIAGNOSIS — M5410 Radiculopathy, site unspecified: Secondary | ICD-10-CM | POA: Diagnosis not present

## 2020-10-14 MED ORDER — DICLOFENAC SODIUM 1 % EX GEL: 4.0000 g | Freq: Four times a day (QID) | CUTANEOUS | 3 refills | Status: DC

## 2020-10-14 MED ORDER — GABAPENTIN 100 MG PO CAPS: 100.0000 mg | ORAL_CAPSULE | Freq: Every evening | ORAL | 2 refills | Status: DC

## 2020-10-14 NOTE — Patient Instructions (Signed)
While we are working on your approval for MRI please go ahead and call to schedule your appointment with [facility] within at least one (1) week.   Central Scheduling [phone]  

## 2020-10-14 NOTE — Progress Notes (Signed)
Chief Complaint  Patient presents with  . Follow-up    Recheck on right hip.    Brittany Hensley had bilateral total hips in May 2019 on the right and then the left and was done July 2019 did well.  Complains of right leg pain starting in her right buttock running down to her right lateral leg.  She has been using some Voltaren cream as well as Tylenol she is here for 4-week follow-up x-rays were normal in terms of the implants  It seems as if her exam is consistent with radiculopathy of the right leg  As she has been treated since September 16, 2020 and no improvement it is prudent to image the neural elements to determine the level of compression which seems to be clinically at L5  Meds ordered this encounter  Medications  . gabapentin (NEURONTIN) 100 MG capsule    Sig: Take 1 capsule (100 mg total) by mouth at bedtime.    Dispense:  30 capsule    Refill:  2  . diclofenac Sodium (VOLTAREN) 1 % GEL    Sig: Apply 4 g topically 4 (four) times daily.    Dispense:  4 g    Refill:  3

## 2020-10-16 ENCOUNTER — Encounter: Payer: Self-pay | Admitting: Family Medicine

## 2020-10-16 ENCOUNTER — Ambulatory Visit (INDEPENDENT_AMBULATORY_CARE_PROVIDER_SITE_OTHER): Payer: PPO | Admitting: Family Medicine

## 2020-10-16 VITALS — BP 132/75 | HR 106 | Temp 97.7°F

## 2020-10-16 DIAGNOSIS — R059 Cough, unspecified: Secondary | ICD-10-CM

## 2020-10-16 DIAGNOSIS — R0981 Nasal congestion: Secondary | ICD-10-CM

## 2020-10-16 MED ORDER — METHYLPREDNISOLONE ACETATE 40 MG/ML IJ SUSP
40.0000 mg | Freq: Once | INTRAMUSCULAR | Status: AC
  Administered 2020-10-16: 40 mg via INTRAMUSCULAR

## 2020-10-16 MED ORDER — BENZONATATE 100 MG PO CAPS: 100.0000 mg | ORAL_CAPSULE | Freq: Three times a day (TID) | ORAL | 0 refills | Status: DC | PRN

## 2020-10-16 NOTE — Progress Notes (Signed)
Acute Office Visit  Subjective:    Patient ID: Brittany Hensley, female    DOB: [birth year], 70 y.o.   MRN: [id number]  Chief Complaint  Patient presents with  . Cough    Sore throat, headache, congestion x 3 days    HPI Patient is in today for head congestion, sore throat, headache, and dry cough x 3 days. Brittany Hensley has an albuterol inhaler that Brittany Hensley has used a few times. Brittany Hensley has used mucinex with little relief.  Denies fever, body aches, chills, nausea, vomiting, or diarrhea. Denies chest pain or shortness of breath. Brittany Hensley has been vaccinated against Covid but has not had her booster.   Past Medical History:  Diagnosis Date  . Anemia    iron   . Anxiety   . Diverticulitis    Hx: of  . GERD (gastroesophageal reflux disease)    at times  . History of kidney stones    10 years ago  . Hypothyroidism   . Low iron   . Pneumonia   . Rosacea    Hx: of    Past Surgical History:  Procedure Laterality Date  . APPENDECTOMY    . BALLOON DILATION  06/16/2011   Procedure: BALLOON DILATION;  Surgeon: Jeryl Columbia, MD;  Location: [facility] ENDOSCOPY;  Service: Endoscopy;  Laterality: N/A;  . BALLOON DILATION N/A 10/03/2013   Procedure: BALLOON DILATION;  Surgeon: Jeryl Columbia, MD;  Location: [facility] ENDOSCOPY;  Service: Endoscopy;  Laterality: N/A;  . BALLOON DILATION N/A 08/10/2014   Procedure: BALLOON DILATION;  Surgeon: Jeryl Columbia, MD;  Location: [facility] ENDOSCOPY;  Service: Endoscopy;  Laterality: N/A;  from 12 - 15 cm dilation completed  . BALLOON DILATION N/A 02/15/2015   Procedure: BALLOON DILATION;  Surgeon: Clarene Essex, MD;  Location: [facility] ENDOSCOPY;  Service: Endoscopy;  Laterality: N/A;  . BALLOON DILATION N/A 11/12/2015   Procedure: BALLOON DILATION;  Surgeon: Clarene Essex, MD;  Location: [facility] ENDOSCOPY;  Service: Endoscopy;  Laterality: N/A;  . BALLOON DILATION N/A 08/10/2016   Procedure: BALLOON DILATION;  Surgeon: Clarene Essex, MD;  Location: [facility] ENDOSCOPY;  Service: Endoscopy;  Laterality: N/A;  . BALLOON  DILATION N/A 08/07/2016   Procedure: BALLOON DILATION;  Surgeon: Clarene Essex, MD;  Location: [facility] ENDOSCOPY;  Service: Endoscopy;  Laterality: N/A;  . BALLOON DILATION N/A 05/20/2017   Procedure: BALLOON DILATION;  Surgeon: Clarene Essex, MD;  Location: [facility] ENDOSCOPY;  Service: Endoscopy;  Laterality: N/A;  . BALLOON DILATION N/A 12/02/2017   Procedure: BALLOON DILATION;  Surgeon: Clarene Essex, MD;  Location: [place];  Service: Endoscopy;  Laterality: N/A;  . BALLOON DILATION N/A 07/19/2018   Procedure: BALLOON DILATION;  Surgeon: Clarene Essex, MD;  Location: [facility] ENDOSCOPY;  Service: Endoscopy;  Laterality: N/A;  . BALLOON DILATION N/A 01/23/2019   Procedure: BALLOON DILATION;  Surgeon: Clarene Essex, MD;  Location: [facility] ENDOSCOPY;  Service: Endoscopy;  Laterality: N/A;  . BALLOON DILATION N/A 07/13/2019   Procedure: BALLOON DILATION;  Surgeon: Clarene Essex, MD;  Location: [facility] ENDOSCOPY;  Service: Endoscopy;  Laterality: N/A;  . BALLOON DILATION N/A 02/02/2020   Procedure: BALLOON DILATION;  Surgeon: Clarene Essex, MD;  Location: [facility] ENDOSCOPY;  Service: Endoscopy;  Laterality: N/A;  . BALLOON DILATION N/A 07/16/2020   Procedure: BALLOON DILATION;  Surgeon: Clarene Essex, MD;  Location: [facility] ENDOSCOPY;  Service: Endoscopy;  Laterality: N/A;  . CATARACT EXTRACTION, BILATERAL    . CHOLECYSTECTOMY OPEN  1979  . COLONOSCOPY     Hx: of  .  ESOPHAGOGASTRODUODENOSCOPY  06/16/2011   Procedure: ESOPHAGOGASTRODUODENOSCOPY (EGD);  Surgeon: Jeryl Columbia, MD;  Location: [facility] ENDOSCOPY;  Service: Endoscopy;  Laterality: N/A;  . ESOPHAGOGASTRODUODENOSCOPY N/A 10/03/2013   Procedure: ESOPHAGOGASTRODUODENOSCOPY (EGD);  Surgeon: Jeryl Columbia, MD;  Location: [facility] ENDOSCOPY;  Service: Endoscopy;  Laterality: N/A;  . ESOPHAGOGASTRODUODENOSCOPY N/A 08/10/2014   Procedure: ESOPHAGOGASTRODUODENOSCOPY (EGD);  Surgeon: Jeryl Columbia, MD;  Location: [facility] ENDOSCOPY;  Service: Endoscopy;  Laterality: N/A;  . ESOPHAGOGASTRODUODENOSCOPY N/A 08/10/2016    Procedure: ESOPHAGOGASTRODUODENOSCOPY (EGD);  Surgeon: Clarene Essex, MD;  Location: [facility] ENDOSCOPY;  Service: Endoscopy;  Laterality: N/A;  . ESOPHAGOGASTRODUODENOSCOPY N/A 07/19/2018   Procedure: ESOPHAGOGASTRODUODENOSCOPY (EGD);  Surgeon: Clarene Essex, MD;  Location: [facility] ENDOSCOPY;  Service: Endoscopy;  Laterality: N/A;  . ESOPHAGOGASTRODUODENOSCOPY (EGD) WITH PROPOFOL N/A 02/15/2015   Procedure: ESOPHAGOGASTRODUODENOSCOPY (EGD) WITH PROPOFOL;  Surgeon: Clarene Essex, MD;  Location: [facility] ENDOSCOPY;  Service: Endoscopy;  Laterality: N/A;  . ESOPHAGOGASTRODUODENOSCOPY (EGD) WITH PROPOFOL N/A 11/12/2015   Procedure: ESOPHAGOGASTRODUODENOSCOPY (EGD) WITH PROPOFOL;  Surgeon: Clarene Essex, MD;  Location: [facility] ENDOSCOPY;  Service: Endoscopy;  Laterality: N/A;  . ESOPHAGOGASTRODUODENOSCOPY (EGD) WITH PROPOFOL N/A 08/07/2016   Procedure: ESOPHAGOGASTRODUODENOSCOPY (EGD) WITH PROPOFOL;  Surgeon: Clarene Essex, MD;  Location: [facility] ENDOSCOPY;  Service: Endoscopy;  Laterality: N/A;  . ESOPHAGOGASTRODUODENOSCOPY (EGD) WITH PROPOFOL N/A 05/20/2017   Procedure: ESOPHAGOGASTRODUODENOSCOPY (EGD) WITH PROPOFOL;  Surgeon: Clarene Essex, MD;  Location: [facility] ENDOSCOPY;  Service: Endoscopy;  Laterality: N/A;  . ESOPHAGOGASTRODUODENOSCOPY (EGD) WITH PROPOFOL N/A 12/02/2017   Procedure: ESOPHAGOGASTRODUODENOSCOPY (EGD) WITH PROPOFOL;  Surgeon: Clarene Essex, MD;  Location: [place];  Service: Endoscopy;  Laterality: N/A;  have c arm available  . ESOPHAGOGASTRODUODENOSCOPY (EGD) WITH PROPOFOL N/A 01/23/2019   Procedure: ESOPHAGOGASTRODUODENOSCOPY (EGD) WITH PROPOFOL;  Surgeon: Clarene Essex, MD;  Location: [facility] ENDOSCOPY;  Service: Endoscopy;  Laterality: N/A;  . ESOPHAGOGASTRODUODENOSCOPY (EGD) WITH PROPOFOL N/A 07/13/2019   Procedure: ESOPHAGOGASTRODUODENOSCOPY (EGD) WITH PROPOFOL;  Surgeon: Clarene Essex, MD;  Location: [facility] ENDOS[facility];  Service: Endoscopy;  Laterality: N/A;  . ESOPHAGOGASTRODUODENOSCOPY (EGD) WITH PROPOFOL N/A 02/02/2020   Procedure:  ESOPHAGOGASTRODUODENOSCOPY (EGD) WITH PROPOFOL;  Surgeon: Clarene Essex, MD;  Location: [facility] ENDOSCOPY;  Service: Endoscopy;  Laterality: N/A;  . ESOPHAGOGASTRODUODENOSCOPY (EGD) WITH PROPOFOL N/A 07/16/2020   Procedure: ESOPHAGOGASTRODUODENOSCOPY (EGD) WITH PROPOFOL;  Surgeon: Clarene Essex, MD;  Location: [facility] ENDOSCOPY;  Service: Endoscopy;  Laterality: N/A;  . FOREIGN BODY REMOVAL  07/19/2018   Procedure: FOREIGN BODY REMOVAL;  Surgeon: Clarene Essex, MD;  Location: [facility] ENDOSCOPY;  Service: Endoscopy;;  . FRACTURE SURGERY     Hx: of left wrist  . GASTRECTOMY  [id number]  . GASTRECTOMY N/A    X3  . LITHOTRIPSY     Hx; of for kidney stones  . SAVORY DILATION N/A 08/10/2014   Procedure: SAVORY DILATION;  Surgeon: Jeryl Columbia, MD;  Location: [facility] ENDOSCOPY;  Service: Endoscopy;  Laterality: N/A;  . TOTAL HIP ARTHROPLASTY Right 12/30/2017   Procedure: RIGHT TOTAL HIP ARTHROPLASTY;  Surgeon: Carole Civil, MD;  Location: [facility];  Service: Orthopedics;  Laterality: Right;  . TOTAL HIP ARTHROPLASTY Left 02/17/2018   Procedure: TOTAL HIP ARTHROPLASTY;  Surgeon: Carole Civil, MD;  Location: [facility];  Service: Orthopedics;  Laterality: Left;  . TUBAL LIGATION     1975    Family History  Problem Relation Age of Onset  . Diabetes Mother   . Cancer - Prostate Brother     Social History   Socioeconomic History  . Marital status: Widowed  Spouse name: Not on file  . Number of children: Not on file  . Years of education: Not on file  . Highest education level: Not on file  Occupational History  . Not on file  Tobacco Use  . Smoking status: Never Smoker  . Smokeless tobacco: Never Used  Vaping Use  . Vaping Use: Never used  Substance and Sexual Activity  . Alcohol use: No  . Drug use: No  . Sexual activity: Not Currently  Other Topics Concern  . Not on file  Social History Narrative  . Not on file   Social Determinants of Health   Financial Resource Strain: Not on file   Food Insecurity: Not on file  Transportation Needs: Not on file  Physical Activity: Not on file  Stress: Not on file  Social Connections: Not on file  Intimate Partner Violence: Not on file    Outpatient Medications Prior to Visit  Medication Sig Dispense Refill  . acetaminophen (TYLENOL) 500 MG tablet Take 500 mg by mouth 2 (two) times daily.     Marland Kitchen albuterol (VENTOLIN HFA) 108 (90 Base) MCG/ACT inhaler USE 2 PUFFS EVERY 6 HOURS AS NEEDED 8.5 g 0  . ALPRAZolam (XANAX) 0.5 MG tablet Take 1 tablet (0.5 mg total) by mouth 4 (four) times daily as needed for anxiety. 120 tablet 5  . Ascorbic Acid (VITAMIN C) 1000 MG tablet Take 1,000 mg by mouth in the morning and at bedtime.     Marland Kitchen b complex vitamins capsule Take 1 capsule by mouth daily.    . Biotin 10 MG CAPS Take 10 mg by mouth daily.    Marland Kitchen BLACK ELDERBERRY PO Take 50 mg by mouth in the morning and at bedtime.    . Calcium Carbonate (CALCIUM 600 PO) Take 600 mg by mouth 2 (two) times daily.     . Cholecalciferol (VITAMIN D) 2000 units tablet Take 2,000 Units by mouth 2 (two) times daily.    . diclofenac Sodium (VOLTAREN) 1 % GEL Apply 4 g topically 4 (four) times daily. 4 g 3  . docusate sodium (COLACE) 100 MG capsule Take 1 capsule (100 mg total) by mouth 2 (two) times daily. (Patient taking differently: Take 100 mg by mouth daily.) 10 capsule 0  . Flaxseed, Linseed, (FLAXSEED OIL) 1200 MG CAPS Take 1,200 mg by mouth daily.     . folic acid (FOLVITE) 462 MCG tablet Take 1 tablet (800 mcg total) by mouth daily. 90 tablet 1  . levothyroxine (SYNTHROID) 100 MCG tablet Take 1 tablet (100 mcg total) by mouth every morning. 90 tablet 1  . loratadine (CLARITIN) 10 MG tablet Take 1 tablet (10 mg total) by mouth daily. 90 tablet 3  . magnesium gluconate (MAGONATE) 500 MG tablet Take 500 mg by mouth daily.    . Multiple Vitamins-Minerals (CENTRUM SILVER) CHEW Chew 1 tablet by mouth 2 (two) times daily.     Marland Kitchen omeprazole (PRILOSEC) 40 MG capsule Take  1 capsule (40 mg total) by mouth daily. 90 capsule 1  . ondansetron (ZOFRAN-ODT) 4 MG disintegrating tablet TAKE 1 TABLET EVERY 8 HOURS AS NEEDED FOR NAUSEA AND VOMITING (Patient taking differently: Take 4 mg by mouth every 8 (eight) hours as needed for nausea or vomiting.) 30 tablet 1  . Polyethyl Glycol-Propyl Glycol (SYSTANE OP) Place 1 drop into both eyes daily as needed (dry eyes).    . Probiotic Product (PROBIOTIC PO) Take 1 capsule by mouth daily.     . sertraline (ZOLOFT)  100 MG tablet Take 2 tablets (200 mg total) by mouth daily. (Patient taking differently: Take 100 mg by mouth daily.) 180 tablet 1  . sucralfate (CARAFATE) 1 g tablet TAKE ONE TABLET THREE TIMES A DAY WITH MEALS 90 tablet 2  . Turmeric Curcumin 500 MG CAPS Take 500 mg by mouth 2 (two) times a day.    Marland Kitchen alendronate (FOSAMAX) 70 MG tablet Take 1 tablet (70 mg total) by mouth every 7 (seven) days. Take with a full glass of water on an empty stomach. (Patient not taking: Reported on 10/16/2020) 4 tablet 11  . gabapentin (NEURONTIN) 100 MG capsule Take 1 capsule (100 mg total) by mouth at bedtime. (Patient not taking: Reported on 10/16/2020) 30 capsule 2   No facility-administered medications prior to visit.    Allergies  Allergen Reactions  . Augmentin [Amoxicillin-Pot Clavulanate] Other (See Comments)    Bloody stool Did it involve swelling of the face/tongue/throat, SOB, or low BP? No Did it involve sudden or severe rash/hives, skin peeling, or any reaction on the inside of your mouth or nose? No Did you need to seek medical attention at a hospital or doctor's office? No When did it last happen?1 year If all above answers are "NO", may proceed with cephalosporin use.   . Famotidine Other (See Comments)    Fever   . Klonopin [Clonazepam] Other (See Comments)    double vision  . Nitrofurantoin Rash  . Reglan [Metoclopramide] Anxiety and Other (See Comments)    Causes confusion  . Sulfamethoxazole-Trimethoprim  Rash  . Voltaren [Diclofenac Sodium] Anxiety    Topical made nervous    Review of Systems As per HPI.     Objective:    Physical Exam Vitals and nursing note reviewed.  Constitutional:      General: Brittany Hensley is not in acute distress.    Appearance: Normal appearance. Brittany Hensley is not ill-appearing.  HENT:     Right Ear: Tympanic membrane, ear canal and external ear normal.     Left Ear: Tympanic membrane, ear canal and external ear normal.     Nose: Congestion present.     Mouth/Throat:     Mouth: Mucous membranes are moist.     Pharynx: Oropharynx is clear. Posterior oropharyngeal erythema present. No pharyngeal swelling, oropharyngeal exudate or uvula swelling.     Tonsils: No tonsillar exudate or tonsillar abscesses.  Cardiovascular:     Rate and Rhythm: Normal rate and regular rhythm.     Pulses: Normal pulses.     Heart sounds: Normal heart sounds. No murmur heard.   Pulmonary:     Effort: Pulmonary effort is normal. No respiratory distress.     Breath sounds: Normal breath sounds. No stridor. No wheezing, rhonchi or rales.  Chest:     Chest wall: No tenderness.  Abdominal:     General: Bowel sounds are normal. There is no distension.     Palpations: Abdomen is soft. There is no mass.     Tenderness: There is no abdominal tenderness. There is no guarding or rebound.  Musculoskeletal:     Cervical back: Neck supple. No tenderness.     Right lower leg: No edema.     Left lower leg: No edema.  Lymphadenopathy:     Cervical: No cervical adenopathy.  Skin:    General: Skin is warm and dry.  Neurological:     General: No focal deficit present.     Mental Status: Brittany Hensley is alert and oriented to person,  place, and time.  Psychiatric:        Mood and Affect: Mood normal.        Behavior: Behavior normal.     BP 132/75   Pulse (!) 106   Temp 97.7 F (36.5 C) (Temporal)   SpO2 100% Comment: room air Wt Readings from Last 3 Encounters:  10/14/20 102 lb (46.3 kg)  09/16/20 102  lb (46.3 kg)  09/10/20 102 lb (46.3 kg)    Health Maintenance Due  Topic Date Due  . COVID-19 Vaccine (3 - Booster for Pfizer series) 10/02/2020    There are no preventive care reminders to display for this patient.   Lab Results  Component Value Date   TSH 3.130 05/03/2020   Lab Results  Component Value Date   WBC 6.1 09/30/2020   HGB 12.8 09/30/2020   HCT 40.5 09/30/2020   MCV 107.4 (H) 09/30/2020   PLT 234 09/30/2020   Lab Results  Component Value Date   NA 144 05/03/2020   K 4.1 05/03/2020   CO2 20 05/03/2020   GLUCOSE 84 05/03/2020   BUN 28 (H) 05/03/2020   CREATININE 1.04 (H) 05/03/2020   BILITOT <0.2 05/03/2020   ALKPHOS 63 05/03/2020   AST 20 05/03/2020   ALT 17 05/03/2020   PROT 5.7 (L) 05/03/2020   ALBUMIN 3.9 05/03/2020   CALCIUM 8.9 05/03/2020   ANIONGAP 7 06/15/2018   Lab Results  Component Value Date   CHOL 190 05/03/2020   Lab Results  Component Value Date   HDL 48 05/03/2020   Lab Results  Component Value Date   LDLCALC 100 (H) 05/03/2020   Lab Results  Component Value Date   TRIG 246 (H) 05/03/2020   Lab Results  Component Value Date   CHOLHDL 4.0 05/03/2020   No results found for: HGBA1C     Assessment & Plan:   Brittany Hensley was seen today for cough.  Diagnoses and all orders for this visit:  Cough Covid test pending, quarantine until results. Pulse ox 100%, lungs clear. Tessalon perles for cough.  -     Novel Coronavirus, NAA ([facility]) -     benzonatate (TESSALON PERLES) 100 MG capsule; Take 1 capsule (100 mg total) by mouth 3 (three) times daily as needed for cough.  Head congestion Steroid IM injection today in office. Continue mucinex, push fluids rest. Tylenol for headache.  -     methylPREDNISolone acetate (DEPO-MEDROL) injection 40 mg  Return to office for new or worsening symptoms, or if symptoms persist.   The patient indicates understanding of these issues and agrees with the plan.  Gwenlyn Perking, FNP

## 2020-10-17 LAB — SARS-COV-2, NAA 2 DAY TAT

## 2020-10-17 LAB — NOVEL CORONAVIRUS, NAA: SARS-CoV-2, NAA: NOT DETECTED

## 2020-10-22 ENCOUNTER — Ambulatory Visit (INDEPENDENT_AMBULATORY_CARE_PROVIDER_SITE_OTHER): Payer: PPO | Admitting: Family Medicine

## 2020-10-22 ENCOUNTER — Encounter: Payer: Self-pay | Admitting: Family Medicine

## 2020-10-22 DIAGNOSIS — J0100 Acute maxillary sinusitis, unspecified: Secondary | ICD-10-CM | POA: Diagnosis not present

## 2020-10-22 MED ORDER — LEVOFLOXACIN 500 MG PO TABS: 500.0000 mg | ORAL_TABLET | Freq: Every day | ORAL | 0 refills | Status: DC

## 2020-10-22 MED ORDER — AMOXICILLIN-POT CLAVULANATE 875-125 MG PO TABS: 1.0000 | ORAL_TABLET | Freq: Two times a day (BID) | ORAL | 0 refills | Status: DC

## 2020-10-22 NOTE — Progress Notes (Signed)
Subjective:    Patient ID: Brittany Hensley, female    DOB: Mar 31, 1951, 70 y.o.   MRN: [id number]   HPI: Brittany Hensley is a 70 y.o. female presenting for congestion. Having chills. Didn't check temp. Having frontal HA and around eyes. Having to blow nose. Yellow & green plegm. Some PND. Same color.    Depression screen [facility] 2/9 05/03/2020 12/01/2019 01/06/2019 08/04/2018 06/27/2018  Decreased Interest 0 0 0 0 0  Down, Depressed, Hopeless 0 0 0 0 0  PHQ - 2 Score 0 0 0 0 0  Some recent data might be hidden     Relevant past medical, surgical, family and social history reviewed and updated as indicated.  Interim medical history since our last visit reviewed. Allergies and medications reviewed and updated.  ROS:  Review of Systems  Constitutional: Positive for chills. Negative for diaphoresis.  HENT: Positive for postnasal drip, rhinorrhea, sinus pressure and sinus pain. Negative for congestion, ear pain, hearing loss, sore throat and trouble swallowing.   Respiratory: Negative for cough, chest tightness and shortness of breath.   Cardiovascular: Negative for chest pain and palpitations.  Skin: Negative for rash.     Social History   Tobacco Use  Smoking Status Never Smoker  Smokeless Tobacco Never Used       Objective:     Wt Readings from Last 3 Encounters:  10/14/20 102 lb (46.3 kg)  09/16/20 102 lb (46.3 kg)  09/10/20 102 lb (46.3 kg)     Exam deferred. Pt. Harboring due to COVID 19. Phone visit performed.   Assessment & Plan:   1. Acute maxillary sinusitis, recurrence not specified     Meds ordered this encounter  Medications  . amoxicillin-clavulanate (AUGMENTIN) 875-125 MG tablet    Sig: Take 1 tablet by mouth 2 (two) times daily. Take all of this medication    Dispense:  20 tablet    Refill:  0  . levofloxacin (LEVAQUIN) 500 MG tablet    Sig: Take 1 tablet (500 mg total) by mouth daily. For 10 days    Dispense:  10 tablet    Refill:  0    Due to pt.  Allergy, please cancel previous scrip for Augmentin. Fill this instead please. Thanks, WS    No orders of the defined types were placed in this encounter.     Diagnoses and all orders for this visit:  Acute maxillary sinusitis, recurrence not specified  Other orders -     amoxicillin-clavulanate (AUGMENTIN) 875-125 MG tablet; Take 1 tablet by mouth 2 (two) times daily. Take all of this medication -     levofloxacin (LEVAQUIN) 500 MG tablet; Take 1 tablet (500 mg total) by mouth daily. For 10 days    Virtual Visit via telephone Note  I discussed the limitations, risks, security and privacy concerns of performing an evaluation and management service by telephone and the availability of in person appointments. The patient was identified with two identifiers. Pt.expressed understanding and agreed to proceed. Pt. Is at home. Dr. Livia Snellen is in his office.  Follow Up Instructions:   I discussed the assessment and treatment plan with the patient. The patient was provided an opportunity to ask questions and all were answered. The patient agreed with the plan and demonstrated an understanding of the instructions.   The patient was advised to call back or seek an in-person evaluation if the symptoms worsen or if the condition fails to improve as anticipated.   Total  minutes including chart review and phone contact time: 14   Follow up plan: Return if symptoms worsen or fail to improve.  Claretta Fraise, MD [place]

## 2020-11-01 ENCOUNTER — Other Ambulatory Visit: Payer: Self-pay

## 2020-11-01 ENCOUNTER — Other Ambulatory Visit: Payer: Self-pay | Admitting: Nurse Practitioner

## 2020-11-01 ENCOUNTER — Encounter: Payer: Self-pay | Admitting: Nurse Practitioner

## 2020-11-01 ENCOUNTER — Ambulatory Visit (INDEPENDENT_AMBULATORY_CARE_PROVIDER_SITE_OTHER): Payer: PPO | Admitting: Nurse Practitioner

## 2020-11-01 VITALS — BP 109/65 | HR 84 | Temp 97.3°F | Resp 20 | Ht 65.0 in | Wt 100.0 lb

## 2020-11-01 DIAGNOSIS — Z681 Body mass index (BMI) 19 or less, adult: Secondary | ICD-10-CM

## 2020-11-01 DIAGNOSIS — M80051A Age-related osteoporosis with current pathological fracture, right femur, initial encounter for fracture: Secondary | ICD-10-CM

## 2020-11-01 DIAGNOSIS — F411 Generalized anxiety disorder: Secondary | ICD-10-CM | POA: Diagnosis not present

## 2020-11-01 DIAGNOSIS — K311 Adult hypertrophic pyloric stenosis: Secondary | ICD-10-CM | POA: Diagnosis not present

## 2020-11-01 DIAGNOSIS — E034 Atrophy of thyroid (acquired): Secondary | ICD-10-CM | POA: Diagnosis not present

## 2020-11-01 DIAGNOSIS — Z903 Acquired absence of stomach [part of]: Secondary | ICD-10-CM

## 2020-11-01 DIAGNOSIS — K912 Postsurgical malabsorption, not elsewhere classified: Secondary | ICD-10-CM

## 2020-11-01 DIAGNOSIS — K219 Gastro-esophageal reflux disease without esophagitis: Secondary | ICD-10-CM

## 2020-11-01 DIAGNOSIS — D500 Iron deficiency anemia secondary to blood loss (chronic): Secondary | ICD-10-CM

## 2020-11-01 MED ORDER — SERTRALINE HCL 100 MG PO TABS: 200.0000 mg | ORAL_TABLET | Freq: Every day | ORAL | 1 refills | Status: DC

## 2020-11-01 MED ORDER — LEVOTHYROXINE SODIUM 100 MCG PO TABS: 100.0000 ug | ORAL_TABLET | Freq: Every morning | ORAL | 1 refills | Status: DC

## 2020-11-01 MED ORDER — FOLIC ACID 800 MCG PO TABS
800.0000 ug | ORAL_TABLET | Freq: Every day | ORAL | 1 refills | Status: DC
Start: 2020-11-01 — End: 2021-05-22

## 2020-11-01 MED ORDER — OMEPRAZOLE 40 MG PO CPDR: 40.0000 mg | DELAYED_RELEASE_CAPSULE | Freq: Every day | ORAL | 1 refills | Status: DC

## 2020-11-01 MED ORDER — ALPRAZOLAM 0.5 MG PO TABS: 0.5000 mg | ORAL_TABLET | Freq: Four times a day (QID) | ORAL | 5 refills | Status: DC | PRN

## 2020-11-01 NOTE — Patient Instructions (Signed)

## 2020-11-01 NOTE — Progress Notes (Signed)
Subjective:    Patient ID: Brittany Hensley, female    DOB: 1950-12-31, 70 y.o.   MRN: [id number]   Chief Complaint: Medical Management of Chronic Issues    HPI:  1. Gastric outlet obstruction patient has had this for awhile. She has had area stretched several times  2. Gastroesophageal reflux disease without esophagitis Takes omperazole daily and still occasionaly has symptoms.  3. Postgastrectomy malabsorption She does not absorb alot of vitamins and nutrients. SHe says she eats a lot but cannot gain weight.  4. Hypothyroidism due to acquired atrophy of thyroid No problems that she is aware of.  Lab Results  Component Value Date   TSH 3.130 05/03/2020    5. Age-related osteoporosis with current pathological fracture of right femur, initial encounter ([place]) Last dexascan was done on 05/03/20 with t score of -3.3. she does not do much weight bearing exercise, but is so small, I am not sure it would hep that much. She has fractured both hips in the past without falling. She is not taking fosamax due to anemia.  6. Iron deficiency anemia due to chronic blood loss She is in a daily iron supplement. She stays pale looking. Always has fatigue. Lab Results  Component Value Date   HGB 12.8 09/30/2020     7. GAD (generalized anxiety disorder) Stays stressed has xanax to takes and takes as needed daily. GAD 7 : Generalized Anxiety Score 11/01/2020 05/03/2020 12/01/2019 01/28/2017  Nervous, Anxious, on Edge 1 1 1 3   Control/stop worrying 1 1 0 3  Worry too much - different things 1 0 1 3  Trouble relaxing 0 0 0 2  Restless 0 0 0 2  Easily annoyed or irritable 0 0 0 2  Afraid - awful might happen 0 0 0 3  Total GAD 7 Score 3 2 2 18   Anxiety Difficulty Not difficult at all Not difficult at all Not difficult at all Very difficult      8. BMI less than 19,adult No weight changes Wt Readings from Last 3 Encounters:  11/01/20 100 lb (45.4 kg)  10/14/20 102 lb (46.3 kg)  09/16/20  102 lb (46.3 kg)   BMI Readings from Last 3 Encounters:  11/01/20 16.64 kg/m  10/14/20 16.97 kg/m  09/16/20 16.97 kg/m       Outpatient Encounter Medications as of 11/01/2020  Medication Sig  . acetaminophen (TYLENOL) 500 MG tablet Take 500 mg by mouth 2 (two) times daily.   Marland Kitchen albuterol (VENTOLIN HFA) 108 (90 Base) MCG/ACT inhaler USE 2 PUFFS EVERY 6 HOURS AS NEEDED  . ALPRAZolam (XANAX) 0.5 MG tablet Take 1 tablet (0.5 mg total) by mouth 4 (four) times daily as needed for anxiety.  . Ascorbic Acid (VITAMIN C) 1000 MG tablet Take 1,000 mg by mouth in the morning and at bedtime.   Marland Kitchen b complex vitamins capsule Take 1 capsule by mouth daily.  . Biotin 10 MG CAPS Take 10 mg by mouth daily.  Marland Kitchen BLACK ELDERBERRY PO Take 50 mg by mouth in the morning and at bedtime.  . Calcium Carbonate (CALCIUM 600 PO) Take 600 mg by mouth 2 (two) times daily.   . Cholecalciferol (VITAMIN D) 2000 units tablet Take 2,000 Units by mouth 2 (two) times daily.  . diclofenac Sodium (VOLTAREN) 1 % GEL Apply 4 g topically 4 (four) times daily.  Marland Kitchen docusate sodium (COLACE) 100 MG capsule Take 1 capsule (100 mg total) by mouth 2 (two) times daily. (Patient taking differently:  Take 100 mg by mouth daily.)  . Flaxseed, Linseed, (FLAXSEED OIL) 1200 MG CAPS Take 1,200 mg by mouth daily.   . folic acid (FOLVITE) 062 MCG tablet Take 1 tablet (800 mcg total) by mouth daily.  Marland Kitchen gabapentin (NEURONTIN) 100 MG capsule Take 1 capsule (100 mg total) by mouth at bedtime.  Marland Kitchen levothyroxine (SYNTHROID) 100 MCG tablet Take 1 tablet (100 mcg total) by mouth every morning.  . loratadine (CLARITIN) 10 MG tablet TAKE ONE (1) TABLET EACH DAY  . magnesium gluconate (MAGONATE) 500 MG tablet Take 500 mg by mouth daily.  . Multiple Vitamins-Minerals (CENTRUM SILVER) CHEW Chew 1 tablet by mouth 2 (two) times daily.   Marland Kitchen omeprazole (PRILOSEC) 40 MG capsule Take 1 capsule (40 mg total) by mouth daily.  . ondansetron (ZOFRAN-ODT) 4 MG disintegrating  tablet TAKE 1 TABLET EVERY 8 HOURS AS NEEDED FOR NAUSEA AND VOMITING (Patient taking differently: Take 4 mg by mouth every 8 (eight) hours as needed for nausea or vomiting.)  . Polyethyl Glycol-Propyl Glycol (SYSTANE OP) Place 1 drop into both eyes daily as needed (dry eyes).  . Probiotic Product (PROBIOTIC PO) Take 1 capsule by mouth daily.   . sertraline (ZOLOFT) 100 MG tablet Take 2 tablets (200 mg total) by mouth daily. (Patient taking differently: Take 100 mg by mouth daily.)  . sucralfate (CARAFATE) 1 g tablet TAKE ONE TABLET THREE TIMES A DAY WITH MEALS  . Turmeric Curcumin 500 MG CAPS Take 500 mg by mouth 2 (two) times a day.     Past Surgical History:  Procedure Laterality Date  . APPENDECTOMY    . BALLOON DILATION  06/16/2011   Procedure: BALLOON DILATION;  Surgeon: Jeryl Columbia, MD;  Location: [facility] ENDOSCOPY;  Service: Endoscopy;  Laterality: N/A;  . BALLOON DILATION N/A 10/03/2013   Procedure: BALLOON DILATION;  Surgeon: Jeryl Columbia, MD;  Location: [facility] ENDOSCOPY;  Service: Endoscopy;  Laterality: N/A;  . BALLOON DILATION N/A 08/10/2014   Procedure: BALLOON DILATION;  Surgeon: Jeryl Columbia, MD;  Location: [facility] ENDOSCOPY;  Service: Endoscopy;  Laterality: N/A;  from 12 - 15 cm dilation completed  . BALLOON DILATION N/A 02/15/2015   Procedure: BALLOON DILATION;  Surgeon: Clarene Essex, MD;  Location: [facility] ENDOSCOPY;  Service: Endoscopy;  Laterality: N/A;  . BALLOON DILATION N/A 11/12/2015   Procedure: BALLOON DILATION;  Surgeon: Clarene Essex, MD;  Location: [facility] ENDOSCOPY;  Service: Endoscopy;  Laterality: N/A;  . BALLOON DILATION N/A 08/10/2016   Procedure: BALLOON DILATION;  Surgeon: Clarene Essex, MD;  Location: [facility] ENDOSCOPY;  Service: Endoscopy;  Laterality: N/A;  . BALLOON DILATION N/A 08/07/2016   Procedure: BALLOON DILATION;  Surgeon: Clarene Essex, MD;  Location: [facility] ENDOSCOPY;  Service: Endoscopy;  Laterality: N/A;  . BALLOON DILATION N/A 05/20/2017   Procedure: BALLOON DILATION;  Surgeon: Clarene Essex, MD;  Location: [facility] ENDOSCOPY;  Service: Endoscopy;  Laterality: N/A;  . BALLOON DILATION N/A 12/02/2017   Procedure: BALLOON DILATION;  Surgeon: Clarene Essex, MD;  Location: [place];  Service: Endoscopy;  Laterality: N/A;  . BALLOON DILATION N/A 07/19/2018   Procedure: BALLOON DILATION;  Surgeon: Clarene Essex, MD;  Location: [facility] ENDOSCOPY;  Service: Endoscopy;  Laterality: N/A;  . BALLOON DILATION N/A 01/23/2019   Procedure: BALLOON DILATION;  Surgeon: Clarene Essex, MD;  Location: [facility] ENDOSCOPY;  Service: Endoscopy;  Laterality: N/A;  . BALLOON DILATION N/A 07/13/2019   Procedure: BALLOON DILATION;  Surgeon: Clarene Essex, MD;  Location: [facility];  Service: Endoscopy;  Laterality:  N/A;  . BALLOON DILATION N/A 02/02/2020   Procedure: [place];  Surgeon: Clarene Essex, MD;  Location: [facility]OS[facility];  Service: Endoscopy;  Laterality: N/A;  . BALLOON DILATION N/A 07/16/2020   Procedure: BALLOON DILATION;  Surgeon: Clarene Essex, MD;  Location: [facility] ENDOSCOPY;  Service: Endoscopy;  Laterality: N/A;  . CATARACT EXTRACTION, BILATERAL    . CHOLECYSTECTOMY OPEN  1979  . COLONOSCOPY     Hx: of  . ESOPHAGOGASTRODUODENOSCOPY  06/16/2011   Procedure: ESOPHAGOGASTRODUODENOSCOPY (EGD);  Surgeon: Jeryl Columbia, MD;  Location: [facility] ENDOSCOPY;  Service: Endoscopy;  Laterality: N/A;  . ESOPHAGOGASTRODUODENOSCOPY N/A 10/03/2013   Procedure: ESOPHAGOGASTRODUODENOSCOPY (EGD);  Surgeon: Jeryl Columbia, MD;  Location: [facility] ENDOS[facility];  Service: Endoscopy;  Laterality: N/A;  . ESOPHAGOGASTRODUODENOSCOPY N/A 08/10/2014   Procedure: ESOPHAGOGASTRODUODENOSCOPY (EGD);  Surgeon: Jeryl Columbia, MD;  Location: [facility] ENDOSCOPY;  Service: Endoscopy;  Laterality: N/A;  . ESOPHAGOGASTRODUODENOSCOPY N/A 08/10/2016   Procedure: ESOPHAGOGASTRODUODENOSCOPY (EGD);  Surgeon: Clarene Essex, MD;  Location: [facility] ENDOSCOPY;  Service: Endoscopy;  Laterality: N/A;  . ESOPHAGOGASTRODUODENOSCOPY N/A 07/19/2018   Procedure: ESOPHAGOGASTRODUODENOSCOPY (EGD);   Surgeon: Clarene Essex, MD;  Location: [facility] ENDOSCOPY;  Service: Endoscopy;  Laterality: N/A;  . ESOPHAGOGASTRODUODENOSCOPY (EGD) WITH PROPOFOL N/A 02/15/2015   Procedure: ESOPHAGOGASTRODUODENOSCOPY (EGD) WITH PROPOFOL;  Surgeon: Clarene Essex, MD;  Location: [facility] ENDOSCOPY;  Service: Endoscopy;  Laterality: N/A;  . ESOPHAGOGASTRODUODENOSCOPY (EGD) WITH PROPOFOL N/A 11/12/2015   Procedure: ESOPHAGOGASTRODUODENOSCOPY (EGD) WITH PROPOFOL;  Surgeon: Clarene Essex, MD;  Location: [facility] ENDOSCOPY;  Service: Endoscopy;  Laterality: N/A;  . ESOPHAGOGASTRODUODENOSCOPY (EGD) WITH PROPOFOL N/A 08/07/2016   Procedure: ESOPHAGOGASTRODUODENOSCOPY (EGD) WITH PROPOFOL;  Surgeon: Clarene Essex, MD;  Location: [facility] ENDOSCOPY;  Service: Endoscopy;  Laterality: N/A;  . ESOPHAGOGASTRODUODENOSCOPY (EGD) WITH PROPOFOL N/A 05/20/2017   Procedure: ESOPHAGOGASTRODUODENOSCOPY (EGD) WITH PROPOFOL;  Surgeon: Clarene Essex, MD;  Location: [facility]OS[facility];  Service: Endoscopy;  Laterality: N/A;  . ESOPHAGOGASTRODUODENOSCOPY (EGD) WITH PROPOFOL N/A 12/02/2017   Procedure: ESOPHAGOGASTRODUODENOSCOPY (EGD) WITH PROPOFOL;  Surgeon: Clarene Essex, MD;  Location: [place];  Service: Endoscopy;  Laterality: N/A;  have c arm available  . ESOPHAGOGASTRODUODENOSCOPY (EGD) WITH PROPOFOL N/A 01/23/2019   Procedure: ESOPHAGOGASTRODUODENOSCOPY (EGD) WITH PROPOFOL;  Surgeon: Clarene Essex, MD;  Location: [facility] ENDOSCOPY;  Service: Endoscopy;  Laterality: N/A;  . ESOPHAGOGASTRODUODENOSCOPY (EGD) WITH PROPOFOL N/A 07/13/2019   Procedure: ESOPHAGOGASTRODUODENOSCOPY (EGD) WITH PROPOFOL;  Surgeon: Clarene Essex, MD;  Location: [facility] ENDOSCOPY;  Service: Endoscopy;  Laterality: N/A;  . ESOPHAGOGASTRODUODENOSCOPY (EGD) WITH PROPOFOL N/A 02/02/2020   Procedure: ESOPHAGOGASTRODUODENOSCOPY (EGD) WITH PROPOFOL;  Surgeon: Clarene Essex, MD;  Location: [facility] ENDOS[facility];  Service: Endoscopy;  Laterality: N/A;  . ESOPHAGOGASTRODUODENOSCOPY (EGD) WITH PROPOFOL N/A 07/16/2020   Procedure:  ESOPHAGOGASTRODUODENOSCOPY (EGD) WITH PROPOFOL;  Surgeon: Clarene Essex, MD;  Location: [facility] ENDOSCOPY;  Service: Endoscopy;  Laterality: N/A;  . FOREIGN BODY REMOVAL  07/19/2018   Procedure: FOREIGN BODY REMOVAL;  Surgeon: Clarene Essex, MD;  Location: [facility] ENDOSCOPY;  Service: Endoscopy;;  . FRACTURE SURGERY     Hx: of left wrist  . GASTRECTOMY  [phone]  . GASTRECTOMY N/A    X3  . LITHOTRIPSY     Hx; of for kidney stones  . SAVORY DILATION N/A 08/10/2014   Procedure: SAVORY DILATION;  Surgeon: Jeryl Columbia, MD;  Location: [facility] ENDOSCOPY;  Service: Endoscopy;  Laterality: N/A;  . TOTAL HIP ARTHROPLASTY Right 12/30/2017   Procedure: RIGHT TOTAL HIP ARTHROPLASTY;  Surgeon: Carole Civil, MD;  Location: [facility];  Service: Orthopedics;  Laterality: Right;  .  TOTAL HIP ARTHROPLASTY Left 02/17/2018   Procedure: TOTAL HIP ARTHROPLASTY;  Surgeon: Carole Civil, MD;  Location: [facility];  Service: Orthopedics;  Laterality: Left;  . TUBAL LIGATION     1975    Family History  Problem Relation Age of Onset  . Diabetes Mother   . Cancer - Prostate Brother     New complaints: Has been having right leg pain. Pain has been radiating all the way down her leg. Her hip replacement checked out ok so they think it is coming from  Her back. She is scheduled for MRI of her back next week. They started her on neurontin which she says is helping.  Social history: Lives by herself.  Controlled substance contract: 12/05/19    Review of Systems  Constitutional: Negative for diaphoresis.  Eyes: Negative for pain.  Respiratory: Negative for shortness of breath.   Cardiovascular: Negative for chest pain, palpitations and leg swelling.  Gastrointestinal: Negative for abdominal pain.  Endocrine: Negative for polydipsia.  Musculoskeletal: Positive for back pain.  Skin: Negative for rash.  Neurological: Negative for dizziness, weakness and headaches.  Hematological: Does not bruise/bleed easily.  All  other systems reviewed and are negative.      Objective:   Physical Exam Vitals and nursing note reviewed.  Constitutional:      General: She is not in acute distress.    Appearance: Normal appearance. She is well-developed.  HENT:     Head: Normocephalic.     Nose: Nose normal.  Eyes:     Pupils: Pupils are equal, round, and reactive to light.  Neck:     Vascular: No carotid bruit or JVD.  Cardiovascular:     Rate and Rhythm: Normal rate and regular rhythm.     Heart sounds: Normal heart sounds.  Pulmonary:     Effort: Pulmonary effort is normal. No respiratory distress.     Breath sounds: Normal breath sounds. No wheezing or rales.  Chest:     Chest wall: No tenderness.  Abdominal:     General: Bowel sounds are normal. There is no distension or abdominal bruit.     Palpations: Abdomen is soft. There is no hepatomegaly, splenomegaly, mass or pulsatile mass.     Tenderness: There is no abdominal tenderness.  Musculoskeletal:        General: Normal range of motion.     Cervical back: Normal range of motion and neck supple.  Lymphadenopathy:     Cervical: No cervical adenopathy.  Skin:    General: Skin is warm and dry.  Neurological:     Mental Status: She is alert and oriented to person, place, and time.     Deep Tendon Reflexes: Reflexes are normal and symmetric.  Psychiatric:        Behavior: Behavior normal.        Thought Content: Thought content normal.        Judgment: Judgment normal.      BP 109/65   Pulse 84   Temp (!) 97.3 F (36.3 C) (Temporal)   Resp 20   Ht 5\' 5"  (1.651 m)   Wt 100 lb (45.4 kg)   SpO2 98%   BMI 16.64 kg/m       Assessment & Plan:  Brittany Hensley comes in today with chief complaint of Medical Management of Chronic Issues   Diagnosis and orders addressed:  1. Gastric outlet obstruction See GI as needed  2. Gastroesophageal reflux disease without esophagitis Avoid spicy foods  Do not eat 2 hours prior to bedtime -  omeprazole (PRILOSEC) 40 MG capsule; Take 1 capsule (40 mg total) by mouth daily.  Dispense: 90 capsule; Refill: 1  3. Postgastrectomy malabsorption  4. Hypothyroidism due to acquired atrophy of thyroid Labs pending - levothyroxine (SYNTHROID) 100 MCG tablet; Take 1 tablet (100 mcg total) by mouth every morning.  Dispense: 90 tablet; Refill: 1  5. Age-related osteoporosis with current pathological fracture of right femur, initial encounter ([place]) Weight bearing exercises  6. Iron deficiency anemia due to chronic blood loss continue daily iron supplements - folic acid (FOLVITE) 409 MCG tablet; Take 1 tablet (800 mcg total) by mouth daily.  Dispense: 90 tablet; Refill: 1  7. GAD (generalized anxiety disorder) *2stress management - sertraline (ZOLOFT) 100 MG tablet; Take 2 tablets (200 mg total) by mouth daily.  Dispense: 180 tablet; Refill: 1 - ALPRAZolam (XANAX) 0.5 MG tablet; Take 1 tablet (0.5 mg total) by mouth 4 (four) times daily as needed for anxiety.  Dispense: 120 tablet; Refill: 5  8. BMI less than 19,adult Discussed diet and exercise for person with BMI >25 Will recheck weight in 3-6 months   Labs pending Health Maintenance reviewed Diet and exercise encouraged  Follow up plan: 6 months   Mary-Margaret Hassell Done, FNP

## 2020-11-02 LAB — LIPID PANEL
Chol/HDL Ratio: 3.7 ratio (ref 0.0–4.4)
Cholesterol, Total: 194 mg/dL (ref 100–199)
HDL: 52 mg/dL (ref >39)
LDL Chol Calc (NIH): 109 mg/dL — ABNORMAL HIGH (ref 0–99)
Triglycerides: 193 mg/dL — ABNORMAL HIGH (ref 0–149)
VLDL Cholesterol Cal: 33 mg/dL (ref 5–40)

## 2020-11-02 LAB — CMP14+EGFR
ALT: 17 IU/L (ref 0–32)
AST: 22 IU/L (ref 0–40)
Albumin/Globulin Ratio: 1.9 (ref 1.2–2.2)
Albumin: 3.7 g/dL — ABNORMAL LOW (ref 3.8–4.8)
Alkaline Phosphatase: 55 IU/L (ref 44–121)
BUN/Creatinine Ratio: 26 (ref 12–28)
BUN: 26 mg/dL (ref 8–27)
Bilirubin Total: <0.2 mg/dL (ref 0.0–1.2)
CO2: 20 mmol/L (ref 20–29)
Calcium: 8.9 mg/dL (ref 8.7–10.3)
Chloride: 109 mmol/L — ABNORMAL HIGH (ref 96–106)
Creatinine, Ser: 1.01 mg/dL — ABNORMAL HIGH (ref 0.57–1.00)
Globulin, Total: 1.9 g/dL (ref 1.5–4.5)
Glucose: 99 mg/dL (ref 65–99)
Potassium: 4 mmol/L (ref 3.5–5.2)
Sodium: 144 mmol/L (ref 134–144)
Total Protein: 5.6 g/dL — ABNORMAL LOW (ref 6.0–8.5)
eGFR: 60 mL/min/{1.73_m2} (ref >59)

## 2020-11-02 LAB — CBC WITH DIFFERENTIAL/PLATELET
Basophils Absolute: 0 10*3/uL (ref 0.0–0.2)
Basos: 0 %
EOS (ABSOLUTE): 0.2 10*3/uL (ref 0.0–0.4)
Eos: 3 %
Hematocrit: 37.7 % (ref 34.0–46.6)
Hemoglobin: 12.4 g/dL (ref 11.1–15.9)
Immature Grans (Abs): 0 10*3/uL (ref 0.0–0.1)
Immature Granulocytes: 0 %
Lymphocytes Absolute: 1.7 10*3/uL (ref 0.7–3.1)
Lymphs: 26 %
MCH: 33.2 pg — ABNORMAL HIGH (ref 26.6–33.0)
MCHC: 32.9 g/dL (ref 31.5–35.7)
MCV: 101 fL — ABNORMAL HIGH (ref 79–97)
Monocytes Absolute: 0.6 10*3/uL (ref 0.1–0.9)
Monocytes: 8 %
Neutrophils Absolute: 4.3 10*3/uL (ref 1.4–7.0)
Neutrophils: 63 %
Platelets: 272 10*3/uL (ref 150–450)
RBC: 3.73 x10E6/uL — ABNORMAL LOW (ref 3.77–5.28)
RDW: 11.3 % — ABNORMAL LOW (ref 11.7–15.4)
WBC: 6.8 10*3/uL (ref 3.4–10.8)

## 2020-11-02 LAB — THYROID PANEL WITH TSH
Free Thyroxine Index: 1.1 — ABNORMAL LOW (ref 1.2–4.9)
T3 Uptake Ratio: 26 % (ref 24–39)
T4, Total: 4.1 ug/dL — ABNORMAL LOW (ref 4.5–12.0)
TSH: 4.99 u[IU]/mL — ABNORMAL HIGH (ref 0.450–4.500)

## 2020-11-04 ENCOUNTER — Ambulatory Visit (HOSPITAL_COMMUNITY)
Admission: RE | Admit: 2020-11-04 | Discharge: 2020-11-04 | Disposition: A | Discharge status: Home / Self Care | Payer: PPO | Source: Ambulatory Visit | Attending: Orthopedic Surgery | Admitting: Orthopedic Surgery

## 2020-11-04 DIAGNOSIS — M545 Low back pain, unspecified: Secondary | ICD-10-CM | POA: Diagnosis not present

## 2020-11-04 DIAGNOSIS — M5410 Radiculopathy, site unspecified: Secondary | ICD-10-CM | POA: Diagnosis not present

## 2020-11-04 MED ORDER — LEVOTHYROXINE SODIUM 112 MCG PO TABS: 112.0000 ug | ORAL_TABLET | Freq: Every day | ORAL | 3 refills | Status: DC

## 2020-11-04 NOTE — Addendum Note (Signed)
Addended by: Chevis Pretty on: 11/04/2020 01:20 PM   Modules accepted: Orders

## 2020-11-05 ENCOUNTER — Telehealth: Payer: Self-pay

## 2020-11-05 NOTE — Telephone Encounter (Signed)
Yes new prescription has already been sent to pharmacy

## 2020-11-05 NOTE — Telephone Encounter (Signed)
Pt aware.

## 2020-11-07 ENCOUNTER — Ambulatory Visit (INDEPENDENT_AMBULATORY_CARE_PROVIDER_SITE_OTHER): Payer: PPO | Admitting: Orthopedic Surgery

## 2020-11-07 ENCOUNTER — Encounter: Payer: Self-pay | Admitting: Orthopedic Surgery

## 2020-11-07 ENCOUNTER — Other Ambulatory Visit: Payer: Self-pay

## 2020-11-07 VITALS — BP 126/68 | HR 98 | Ht 65.0 in | Wt 101.0 lb

## 2020-11-07 DIAGNOSIS — Z96641 Presence of right artificial hip joint: Secondary | ICD-10-CM

## 2020-11-07 DIAGNOSIS — M5410 Radiculopathy, site unspecified: Secondary | ICD-10-CM | POA: Diagnosis not present

## 2020-11-07 NOTE — Progress Notes (Signed)
Chief Complaint  Patient presents with  . Hip Pain    Rt hip pain S/P hip replacement 12/30/17    69 year old female evaluated for pain in her right hip and lower back status post total hip however we felt that her pain was coming from her lumbar spine Center for MRI MRI is back she has multilevel disc disease bilateral foraminal stenosis and we recommend epidural steroid injections  I have read the MRI agree with the report as written  My interpretation is that she has multilevel disc disease with symptomatic foraminal stenosis  T12-L1: No spinal canal or neural foraminal stenosis.  L1-2: Loss of disc height, disc bulge with superimposed small central disc protrusion and mild facet degenerative changes without significant spinal canal or neural foraminal stenosis.  L2-3: Disc bulge, facet degenerative changes and ligamentum flavum redundancy resulting in mild bilateral neural foraminal narrowing, right greater than left.  L3-4: Disc bulge, facet degenerative changes ligamentum flavum redundancy without significant spinal canal or neural foraminal stenosis.  L4-5: Disc bulge, facet degenerative changes and ligamentum flavum redundancy resulting in mild spinal canal stenosis with narrowing of the bilateral subarticular zones. No significant neural foraminal narrowing.  L5-S1: Prominent loss of disc height, disc bulge with associated osteophytic component, superimposed central disc protrusion and mild facet degenerative changes. Findings result in mild narrowing of the bilateral subarticular zones and mild-to-moderate bilateral neural foraminal narrowing.  IMPRESSION: 1. A transitional lumbosacral vertebra is assumed to represent the S1 level with a rudimentary disc at S1-2. Careful correlation with this numbering strategy prior to any procedural intervention would be recommended. 2. Mild multilevel degenerative changes of the lumbar spine as described above, worst at  L5-S1 where there is mild narrowing of the bilateral subarticular zones and mild-to-moderate bilateral neural foraminal narrowing.   Electronically Signed   By: Pedro Earls M.D.   On: 11/04/2020 15:54

## 2020-11-07 NOTE — Addendum Note (Signed)
Addended byCandice Camp on: 11/07/2020 11:55 AM   Modules accepted: Orders

## 2020-11-07 NOTE — Patient Instructions (Addendum)
Dr. Ernestina Patches office  Driving Directions to [facility] from [facility] address is [place] The phone number is [phone]   1. Start out going [place] on [address] toward [address].  Then 0.02 miles0.02 total miles 2. Take the 1st right onto [address]/[place]. Continue to follow [address].  If you reach [facility] you've gone a little too far  Then 0.58 miles0.60 total miles 3. Turn right onto [address] is just past [facility]  Then 2.25 miles2.85 total miles 4. Take the [address] ramp toward [place].  Then 0.25 miles3.10 total miles 5. Merge onto [address]  Then 18.17 miles21.28 total miles 6. Merge onto [address]  Then 1.47 miles22.74 total miles 7. Turn right onto [place] is just past [place]  Then 0.11 miles22.85 total miles  8. [address], [place], [place] [address], [place] is on the left.   Epidural Steroid Injection  An epidural steroid injection is a shot of steroid medicine and numbing medicine that is given into the space between the spinal cord and the bones of the back (epidural space). The shot helps relieve pain caused by an irritated or swollen nerve root. The amount of pain relief you get from the injection depends on what is causing the nerve to be swollen and irritated, and how long your pain lasts. You are more likely to benefit from this injection if your pain is strong and comes on suddenly rather than if you have had long-term (chronic) pain. Tell a health care provider about:  Any allergies you have.  All medicines you are taking, including vitamins, herbs, eye drops, creams, and over-the-counter medicines.  Any problems you or family members have had with anesthetic medicines.  Any blood disorders you have.  Any surgeries you have had.  Any medical conditions you have.  Whether you are  pregnant or may be pregnant. What are the risks? Generally, this is a safe procedure. However, problems may occur, including:  Headache.  Bleeding.  Infection.  Allergic reaction to medicines.  Nerve damage. What happens before the procedure? Staying hydrated Follow instructions from your health care provider about hydration, which may include:  Up to 2 hours before the procedure - you may continue to drink clear liquids, such as water, clear fruit juice, black coffee, and plain tea. Eating and drinking restrictions Follow instructions from your health care provider about eating and drinking, which may include:  8 hours before the procedure - stop eating heavy meals or foods, such as meat, fried foods, or fatty foods.  6 hours before the procedure - stop eating light meals or foods, such as toast or cereal.  6 hours before the procedure - stop drinking milk or drinks that contain milk.  2 hours before the procedure - stop drinking clear liquids. Medicines  You may be given medicines to lower anxiety.  Ask your health care provider about: ? Changing or stopping your regular medicines. This is especially important if you are taking diabetes medicines or blood thinners. ? Taking medicines such as aspirin and ibuprofen. These medicines can thin your blood. Do not take these medicines unless your health care provider tells you to take them. ? Taking over-the-counter medicines, vitamins, herbs, and supplements. General instructions  Ask your health care provider what steps will be taken to prevent infection.  Plan  to have a responsible adult take you home from the hospital or clinic.  If you will be going home right after the procedure, plan to have a responsible adult care for you for the time you are told. This is important. What happens during the procedure?  An IV will be inserted into one of your veins.  You will be given one or more of the following: ? A medicine to  help you relax (sedative). ? A medicine to numb the area (local anesthetic).  You will be asked to lie on your abdomen or sit.  The injection site will be cleaned.  A needle will be inserted through your skin into the epidural space. This may cause you some discomfort. An X-ray machine will be used to guide the needle as close as possible to the affected nerve.  A steroid medicine and a local anesthetic will be injected into the epidural space.  The needle and IV will be removed.  A bandage (dressing) will be put over the injection site. The procedure may vary among health care providers and hospitals. What can I expect after the procedure?  Your blood pressure, heart rate, breathing rate, and blood oxygen level will be monitored until you leave the hospital or clinic.  Your arm or leg may feel weak or numb for a few hours.  The injection site may feel sore. Follow these instructions at home: Injection site care  You may remove the bandage (dressing) after 24 hours.  Check your injection site every day for signs of infection. Check for: ? Redness, swelling, or pain. ? Fluid or blood. ? Warmth. ? Pus or a bad smell. Managing pain, stiffness, and swelling  For 24 hours after the procedure: ? Avoid using heat on the injection site. ? Do not take baths, swim, or use a hot tub until your health care provider approves. Ask your health care provider if you may take showers. You may only be allowed to take sponge baths.   If directed, put ice on the injection site. To do this: ? Put ice in a plastic bag. ? Place a towel between your skin and the bag. ? Leave the ice on for 20 minutes, 2-3 times a day.   Activity  If you were given a sedative during the procedure, it can affect you for several hours. Do not drive or operate machinery until your health care provider says that it is safe.  Return to your normal activities as told by your health care provider. Ask your health care  provider what activities are safe for you. General instructions  Take over-the-counter and prescription medicines only as told by your health care provider.  Drink enough fluid to keep your urine pale yellow.  Keep all follow-up visits as told by your health care provider. This is important. Contact a health care provider if:  You have any of these signs of infection: ? Redness, swelling, or pain around your injection site. ? Fluid or blood coming from your injection site. ? Warmth coming from your injection site. ? Pus or a bad smell coming from your injection site. ? A fever.  You continue to have pain and soreness around the injection site, even after taking over-the-counter pain medicine.  You have severe, sudden, or lasting nausea or vomiting. Get help right away if:  You have severe pain at the injection site that is not relieved by medicines.  You develop a severe headache or a stiff neck.  You become  sensitive to light.  You have any new numbness or weakness in your legs or arms.  You lose control of your bladder or bowel movements.  You have trouble breathing. Summary  An epidural steroid injection is a shot of steroid medicine and numbing medicine that is given into the epidural space.  The shot helps relieve pain caused by an irritated or swollen nerve root.  You are more likely to benefit from this injection if your pain is strong and comes on suddenly rather than if you have had chronic pain. This information is not intended to replace advice given to you by your health care provider. Make sure you discuss any questions you have with your health care provider. Document Revised: 11/17/2019 Document Reviewed: 01/30/2019 Elsevier Patient Education  [place].

## 2020-11-08 ENCOUNTER — Telehealth: Payer: Self-pay

## 2020-11-08 NOTE — Telephone Encounter (Signed)
Pt would like to have RX for her Appt 4/25

## 2020-11-11 ENCOUNTER — Other Ambulatory Visit: Payer: Self-pay | Admitting: Physical Medicine and Rehabilitation

## 2020-11-11 DIAGNOSIS — F411 Generalized anxiety disorder: Secondary | ICD-10-CM

## 2020-11-11 MED ORDER — TIZANIDINE HCL 4 MG PO TABS: ORAL_TABLET | ORAL | 0 refills | Status: DC

## 2020-11-11 NOTE — Telephone Encounter (Signed)
Done, Tizanidine sent in with instructions

## 2020-11-11 NOTE — Progress Notes (Signed)
Pre-procedure tizanidine ordered for pre-operative anxiety.

## 2020-11-12 ENCOUNTER — Telehealth: Payer: Self-pay

## 2020-11-12 NOTE — Telephone Encounter (Signed)
Called pt back and informed her about the appt

## 2020-11-12 NOTE — Telephone Encounter (Signed)
Patient called she has a couple of questions regarding the injection she is also requesting information regarding injection process call back:[phone]

## 2020-11-25 ENCOUNTER — Other Ambulatory Visit: Payer: Self-pay

## 2020-11-25 ENCOUNTER — Encounter: Payer: Self-pay | Admitting: Physical Medicine and Rehabilitation

## 2020-11-25 ENCOUNTER — Ambulatory Visit: Payer: Self-pay

## 2020-11-25 ENCOUNTER — Ambulatory Visit (INDEPENDENT_AMBULATORY_CARE_PROVIDER_SITE_OTHER): Payer: PPO | Admitting: Physical Medicine and Rehabilitation

## 2020-11-25 VITALS — BP 121/75 | HR 93

## 2020-11-25 DIAGNOSIS — M5416 Radiculopathy, lumbar region: Secondary | ICD-10-CM | POA: Diagnosis not present

## 2020-11-25 MED ORDER — BETAMETHASONE SOD PHOS & ACET 6 (3-3) MG/ML IJ SUSP
12.0000 mg | Freq: Once | INTRAMUSCULAR | Status: AC
  Administered 2020-11-25: 12 mg

## 2020-11-25 NOTE — Patient Instructions (Signed)

## 2020-11-25 NOTE — Progress Notes (Signed)
Pt state lower back pain that travels down her right leg to her thigh. Pt state twisting her hip makes the pain worse. Pt state she use heating pad to help ease pain.  Numeric Pain Rating Scale and Functional Assessment Average Pain 5   In the last MONTH (on 0-10 scale) has pain interfered with the following?  1. General activity like being  able to carry out your everyday physical activities such as walking, climbing stairs, carrying groceries, or moving a chair?  Rating(9)   +Driver, -BT, -Dye Allergies.

## 2020-11-25 NOTE — Progress Notes (Signed)
Brittany Hensley - 70 y.o. female MRN [id number]  Date of birth: Nov 27, 1950  Office Visit Note: Visit Date: 11/25/2020 PCP: Chevis Pretty, FNP Referred by: Hassell Done, Mary-Margaret, *  Subjective: Chief Complaint  Patient presents with  . Lower Back - Pain  . Right Thigh - Pain   HPI:  Brittany Hensley is a 70 y.o. female who comes in today at the request of Dr. [name] for planned Right L5-S1 Lumbar epidural steroid injection with fluoroscopic guidance.  The patient has failed conservative care including home exercise, medications, time and activity modification.  This injection will be diagnostic and hopefully therapeutic.  Please see requesting physician notes for further details and justification. MRI reviewed with images and spine model.  MRI reviewed in the note below. Transitional S1 segment.  Depending on relief would look at a second injection potentially transforaminal approach at L4.  She could be having some back pain in general from facet arthropathy which is worse at L4-5 with some gaping facet joint on the right.  She reports being told that she had 5 injured vertebra.  I did show her her MRI today in the vertebral look fine to me and I did print out her report for her.    ROS Otherwise per HPI.  Assessment & Plan: Visit Diagnoses:    ICD-10-CM   1. Lumbar radiculopathy  M54.16 XR C-ARM NO REPORT    Epidural Steroid injection    betamethasone acetate-betamethasone sodium phosphate (CELESTONE) injection 12 mg    Plan: No additional findings.   Meds & Orders:  Meds ordered this encounter  Medications  . betamethasone acetate-betamethasone sodium phosphate (CELESTONE) injection 12 mg    Orders Placed This Encounter  Procedures  . XR C-ARM NO REPORT  . Epidural Steroid injection    Follow-up: Return for [name], MD as scheduled.   Procedures: No procedures performed  Lumbar Epidural Steroid Injection - Interlaminar Approach with  Fluoroscopic Guidance  Patient: Brittany Hensley      Date of Birth: [birth year] MRN: [id number] PCP: Chevis Pretty, FNP      Visit Date: 11/25/2020   Universal Protocol:     Consent Given By: the patient  Position: PRONE  Additional Comments: Vital signs were monitored before and after the procedure. Patient was prepped and draped in the usual sterile fashion. The correct patient, procedure, and site was verified.   Injection Procedure Details:   Procedure diagnoses: Lumbar radiculopathy [M54.16]   Meds Administered:  Meds ordered this encounter  Medications  . betamethasone acetate-betamethasone sodium phosphate (CELESTONE) injection 12 mg     Laterality: Right  Location/Site:  L5-S1  Needle: 3.5 in., 20 ga. Tuohy  Needle Placement: Paramedian epidural  Findings:   -Comments: Excellent flow of contrast into the epidural space.  Initial flow of contrast was epidural with good flow on the counter oblique but there was a vascular and venous flow.  We did reposition the needle and did obtain loss-of-resistance a second time and there was no vascular flow at that point.  Procedure Details: Using a paramedian approach from the side mentioned above, the region overlying the inferior lamina was localized under fluoroscopic visualization and the soft tissues overlying this structure were infiltrated with 4 ml. of 1% Lidocaine without Epinephrine. The Tuohy needle was inserted into the epidural space using a paramedian approach.   The epidural space was localized using loss of resistance along with counter oblique bi-planar fluoroscopic views.  After negative aspirate for air, blood,  and CSF, a 2 ml. volume of Isovue-250 was injected into the epidural space and the flow of contrast was observed. Radiographs were obtained for documentation purposes.    The injectate was administered into the level noted above.   Additional Comments:  The patient tolerated the procedure  well Dressing: 2 x 2 sterile gauze and Band-Aid    Post-procedure details: Patient was observed during the procedure. Post-procedure instructions were reviewed.  Patient left the clinic in stable condition.     Clinical History: MRI LUMBAR SPINE WITHOUT CONTRAST  TECHNIQUE: Multiplanar, multisequence MR imaging of the lumbar spine was performed. No intravenous contrast was administered.  COMPARISON:  Radiographs September 16, 2020  FINDINGS: Segmentation: A transitional lumbosacral vertebra is assumed to represent the S1 level with a rudimentary disc at S1-2. Careful correlation with this numbering strategy prior to any procedural intervention would be recommended.  Alignment: Grade 1 anterolisthesis of L4 over L5. Retrolisthesis of L1 over L2.  Vertebrae: No fracture, evidence of discitis, or bone lesion. Endplate degenerative changes at L5-S1.  Conus medullaris and cauda equina: Conus extends to the L1 level. Conus and cauda equina appear normal.  Paraspinal and other soft tissues: Negative.  Disc levels:  T12-L1: No spinal canal or neural foraminal stenosis.  L1-2: Loss of disc height, disc bulge with superimposed small central disc protrusion and mild facet degenerative changes without significant spinal canal or neural foraminal stenosis.  L2-3: Disc bulge, facet degenerative changes and ligamentum flavum redundancy resulting in mild bilateral neural foraminal narrowing, right greater than left.  L3-4: Disc bulge, facet degenerative changes ligamentum flavum redundancy without significant spinal canal or neural foraminal stenosis.  L4-5: Disc bulge, facet degenerative changes and ligamentum flavum redundancy resulting in mild spinal canal stenosis with narrowing of the bilateral subarticular zones. No significant neural foraminal narrowing.  L5-S1: Prominent loss of disc height, disc bulge with associated osteophytic component,  superimposed central disc protrusion and mild facet degenerative changes. Findings result in mild narrowing of the bilateral subarticular zones and mild-to-moderate bilateral neural foraminal narrowing.  IMPRESSION: 1. A transitional lumbosacral vertebra is assumed to represent the S1 level with a rudimentary disc at S1-2. Careful correlation with this numbering strategy prior to any procedural intervention would be recommended. 2. Mild multilevel degenerative changes of the lumbar spine as described above, worst at L5-S1 where there is mild narrowing of the bilateral subarticular zones and mild-to-moderate bilateral neural foraminal narrowing.   Electronically Signed   By: Pedro Earls M.D.   On: 11/04/2020 15:54     Objective:  VS:  HT:    WT:   BMI:     BP:121/75  HR:93bpm  TEMP: ( )  RESP:  Physical Exam Vitals and nursing note reviewed.  Constitutional:      General: She is not in acute distress.    Appearance: Normal appearance. She is not ill-appearing.     Comments: Very thin  HENT:     Head: Normocephalic and atraumatic.     Right Ear: External ear normal.     Left Ear: External ear normal.  Eyes:     Extraocular Movements: Extraocular movements intact.  Cardiovascular:     Rate and Rhythm: Normal rate.     Pulses: Normal pulses.  Pulmonary:     Effort: Pulmonary effort is normal. No respiratory distress.  Abdominal:     General: There is no distension.     Palpations: Abdomen is soft.  Musculoskeletal:  General: Tenderness present.     Cervical back: Neck supple.     Right lower leg: No edema.     Left lower leg: No edema.     Comments: Patient has good distal strength with no pain over the greater trochanters.  No clonus or focal weakness.  Skin:    Findings: No erythema, lesion or rash.  Neurological:     General: No focal deficit present.     Mental Status: She is alert and oriented to person, place, and time.      Sensory: No sensory deficit.     Motor: No weakness or abnormal muscle tone.     Coordination: Coordination normal.  Psychiatric:        Mood and Affect: Mood normal.        Behavior: Behavior normal.      Imaging: No results found.

## 2020-11-25 NOTE — Procedures (Signed)
Lumbar Epidural Steroid Injection - Interlaminar Approach with Fluoroscopic Guidance  Patient: Brittany Hensley      Date of Birth: 1951-07-19 MRN: [id number] PCP: Chevis Pretty, FNP      Visit Date: 11/25/2020   Universal Protocol:     Consent Given By: the patient  Position: PRONE  Additional Comments: Vital signs were monitored before and after the procedure. Patient was prepped and draped in the usual sterile fashion. The correct patient, procedure, and site was verified.   Injection Procedure Details:   Procedure diagnoses: Lumbar radiculopathy [M54.16]   Meds Administered:  Meds ordered this encounter  Medications  . betamethasone acetate-betamethasone sodium phosphate (CELESTONE) injection 12 mg     Laterality: Right  Location/Site:  L5-S1  Needle: 3.5 in., 20 ga. Tuohy  Needle Placement: Paramedian epidural  Findings:   -Comments: Excellent flow of contrast into the epidural space.  Initial flow of contrast was epidural with good flow on the counter oblique but there was a vascular and venous flow.  We did reposition the needle and did obtain loss-of-resistance a second time and there was no vascular flow at that point.  Procedure Details: Using a paramedian approach from the side mentioned above, the region overlying the inferior lamina was localized under fluoroscopic visualization and the soft tissues overlying this structure were infiltrated with 4 ml. of 1% Lidocaine without Epinephrine. The Tuohy needle was inserted into the epidural space using a paramedian approach.   The epidural space was localized using loss of resistance along with counter oblique bi-planar fluoroscopic views.  After negative aspirate for air, blood, and CSF, a 2 ml. volume of Isovue-250 was injected into the epidural space and the flow of contrast was observed. Radiographs were obtained for documentation purposes.    The injectate was administered into the level noted  above.   Additional Comments:  The patient tolerated the procedure well Dressing: 2 x 2 sterile gauze and Band-Aid    Post-procedure details: Patient was observed during the procedure. Post-procedure instructions were reviewed.  Patient left the clinic in stable condition.

## 2020-11-26 ENCOUNTER — Other Ambulatory Visit: Payer: Self-pay | Admitting: Nurse Practitioner

## 2020-11-26 DIAGNOSIS — D500 Iron deficiency anemia secondary to blood loss (chronic): Secondary | ICD-10-CM

## 2020-12-09 ENCOUNTER — Other Ambulatory Visit: Payer: Self-pay | Admitting: Orthopedic Surgery

## 2020-12-19 ENCOUNTER — Ambulatory Visit (INDEPENDENT_AMBULATORY_CARE_PROVIDER_SITE_OTHER): Payer: PPO | Admitting: Orthopedic Surgery

## 2020-12-19 ENCOUNTER — Encounter: Payer: Self-pay | Admitting: Orthopedic Surgery

## 2020-12-19 ENCOUNTER — Other Ambulatory Visit: Payer: Self-pay

## 2020-12-19 VITALS — BP 117/64 | HR 90 | Ht 65.0 in | Wt 101.0 lb

## 2020-12-19 DIAGNOSIS — Z96641 Presence of right artificial hip joint: Secondary | ICD-10-CM

## 2020-12-19 DIAGNOSIS — R634 Abnormal weight loss: Secondary | ICD-10-CM | POA: Insufficient documentation

## 2020-12-19 DIAGNOSIS — K2940 Chronic atrophic gastritis without bleeding: Secondary | ICD-10-CM | POA: Insufficient documentation

## 2020-12-19 DIAGNOSIS — K921 Melena: Secondary | ICD-10-CM | POA: Insufficient documentation

## 2020-12-19 DIAGNOSIS — R112 Nausea with vomiting, unspecified: Secondary | ICD-10-CM | POA: Insufficient documentation

## 2020-12-19 DIAGNOSIS — R1311 Dysphagia, oral phase: Secondary | ICD-10-CM | POA: Insufficient documentation

## 2020-12-19 DIAGNOSIS — K311 Adult hypertrophic pyloric stenosis: Secondary | ICD-10-CM | POA: Insufficient documentation

## 2020-12-19 DIAGNOSIS — M5410 Radiculopathy, site unspecified: Secondary | ICD-10-CM | POA: Diagnosis not present

## 2020-12-19 DIAGNOSIS — K5901 Slow transit constipation: Secondary | ICD-10-CM | POA: Insufficient documentation

## 2020-12-19 NOTE — Patient Instructions (Signed)
Call us if you need another injection  Continue gabapentin

## 2020-12-19 NOTE — Progress Notes (Signed)
Chief Complaint  Patient presents with  . Back Pain    Right radicular pain / follow up had ESI's   Brittany Hensley got good relief from the injection, she is still on gabapentin 100 mg at night it is helping her sleep  She is currently asymptomatic  If she becomes symptomatic again she will call the office and we will set up a second epidural injection on the first series of 3 that we ordered  Encounter Diagnoses  Name Primary?  . Radicular pain of right lower extremity Yes  . S/P hip replacement, right 12/30/17      70 year old female evaluated for pain in her right hip and lower back status post total hip however we felt that her pain was coming from her lumbar spine Center for MRI MRI is back she has multilevel disc disease bilateral foraminal stenosis and we recommend epidural steroid injections   I have read the MRI agree with the report as written   My interpretation is that she has multilevel disc disease with symptomatic foraminal stenosis  IMPRESSION: 1. A transitional lumbosacral vertebra is assumed to represent the S1 level with a rudimentary disc at S1-2. Careful correlation with this numbering strategy prior to any procedural intervention would be recommended. 2. Mild multilevel degenerative changes of the lumbar spine as described above, worst at L5-S1 where there is mild narrowing of the bilateral subarticular zones and mild-to-moderate bilateral neural foraminal narrowing.   Electronically Signed   By: Pedro Earls M.D.   On: 11/04/2020 15:54

## 2020-12-31 ENCOUNTER — Other Ambulatory Visit: Payer: Self-pay | Admitting: Nurse Practitioner

## 2021-01-03 ENCOUNTER — Emergency Department (HOSPITAL_COMMUNITY): Payer: PPO

## 2021-01-03 ENCOUNTER — Emergency Department (HOSPITAL_COMMUNITY)
Admission: EM | Admit: 2021-01-03 | Discharge: 2021-01-03 | Disposition: A | Discharge status: Home / Self Care | Payer: PPO | Attending: Emergency Medicine | Admitting: Emergency Medicine

## 2021-01-03 ENCOUNTER — Other Ambulatory Visit: Payer: Self-pay

## 2021-01-03 ENCOUNTER — Encounter (HOSPITAL_COMMUNITY): Payer: Self-pay

## 2021-01-03 DIAGNOSIS — Y92410 Unspecified street and highway as the place of occurrence of the external cause: Secondary | ICD-10-CM | POA: Insufficient documentation

## 2021-01-03 DIAGNOSIS — S59912A Unspecified injury of left forearm, initial encounter: Secondary | ICD-10-CM | POA: Diagnosis present

## 2021-01-03 DIAGNOSIS — Z79899 Other long term (current) drug therapy: Secondary | ICD-10-CM | POA: Diagnosis not present

## 2021-01-03 DIAGNOSIS — S51812A Laceration without foreign body of left forearm, initial encounter: Secondary | ICD-10-CM | POA: Insufficient documentation

## 2021-01-03 DIAGNOSIS — M7989 Other specified soft tissue disorders: Secondary | ICD-10-CM | POA: Diagnosis not present

## 2021-01-03 DIAGNOSIS — Z041 Encounter for examination and observation following transport accident: Secondary | ICD-10-CM | POA: Diagnosis not present

## 2021-01-03 DIAGNOSIS — E039 Hypothyroidism, unspecified: Secondary | ICD-10-CM | POA: Insufficient documentation

## 2021-01-03 DIAGNOSIS — N3001 Acute cystitis with hematuria: Secondary | ICD-10-CM | POA: Insufficient documentation

## 2021-01-03 DIAGNOSIS — S0003XA Contusion of scalp, initial encounter: Secondary | ICD-10-CM | POA: Diagnosis not present

## 2021-01-03 DIAGNOSIS — Z96643 Presence of artificial hip joint, bilateral: Secondary | ICD-10-CM | POA: Insufficient documentation

## 2021-01-03 DIAGNOSIS — S8002XA Contusion of left knee, initial encounter: Secondary | ICD-10-CM | POA: Insufficient documentation

## 2021-01-03 DIAGNOSIS — S0990XA Unspecified injury of head, initial encounter: Secondary | ICD-10-CM | POA: Diagnosis not present

## 2021-01-03 DIAGNOSIS — M47812 Spondylosis without myelopathy or radiculopathy, cervical region: Secondary | ICD-10-CM | POA: Diagnosis not present

## 2021-01-03 DIAGNOSIS — Z23 Encounter for immunization: Secondary | ICD-10-CM | POA: Diagnosis not present

## 2021-01-03 LAB — URINALYSIS, ROUTINE W REFLEX MICROSCOPIC
Bilirubin Urine: NEGATIVE
Glucose, UA: NEGATIVE mg/dL
Ketones, ur: NEGATIVE mg/dL
Nitrite: NEGATIVE
Protein, ur: 100 mg/dL — AB
RBC / HPF: >50 RBC/hpf — ABNORMAL HIGH (ref 0–5)
Specific Gravity, Urine: 1.017 (ref 1.005–1.030)
WBC, UA: >50 WBC/hpf — ABNORMAL HIGH (ref 0–5)
pH: 5 (ref 5.0–8.0)

## 2021-01-03 MED ORDER — CEPHALEXIN 500 MG PO CAPS
500.0000 mg | ORAL_CAPSULE | Freq: Once | ORAL | Status: AC
  Administered 2021-01-03: 500 mg via ORAL
  Filled 2021-01-03: qty 1

## 2021-01-03 MED ORDER — POVIDONE-IODINE 10 % EX SOLN: CUTANEOUS | Status: DC | PRN

## 2021-01-03 MED ORDER — CEPHALEXIN 500 MG PO CAPS: 500.0000 mg | ORAL_CAPSULE | Freq: Two times a day (BID) | ORAL | 0 refills | Status: DC

## 2021-01-03 MED ORDER — TETANUS-DIPHTH-ACELL PERTUSSIS 5-2.5-18.5 LF-MCG/0.5 IM SUSY
0.5000 mL | PREFILLED_SYRINGE | Freq: Once | INTRAMUSCULAR | Status: AC
  Administered 2021-01-03: 0.5 mL via INTRAMUSCULAR
  Filled 2021-01-03: qty 0.5

## 2021-01-03 NOTE — ED Notes (Signed)
Ice applied to left knee area.
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
## 2021-01-05 LAB — URINE CULTURE: Culture: <10000 — AB

## 2021-01-09 ENCOUNTER — Encounter: Payer: Self-pay | Admitting: Orthopedic Surgery

## 2021-01-09 ENCOUNTER — Other Ambulatory Visit: Payer: Self-pay

## 2021-01-09 ENCOUNTER — Ambulatory Visit
Admission: EM | Admit: 2021-01-09 | Discharge: 2021-01-09 | Disposition: A | Discharge status: Home / Self Care | Payer: PPO | Attending: Internal Medicine | Admitting: Internal Medicine

## 2021-01-09 ENCOUNTER — Telehealth: Payer: Self-pay | Admitting: Orthopedic Surgery

## 2021-01-09 ENCOUNTER — Ambulatory Visit (INDEPENDENT_AMBULATORY_CARE_PROVIDER_SITE_OTHER): Payer: PPO

## 2021-01-09 ENCOUNTER — Ambulatory Visit (INDEPENDENT_AMBULATORY_CARE_PROVIDER_SITE_OTHER): Payer: PPO | Admitting: Orthopedic Surgery

## 2021-01-09 VITALS — BP 176/92 | HR 103 | Ht 65.0 in | Wt 101.0 lb

## 2021-01-09 DIAGNOSIS — S52601A Unspecified fracture of lower end of right ulna, initial encounter for closed fracture: Secondary | ICD-10-CM

## 2021-01-09 DIAGNOSIS — S52235A Nondisplaced oblique fracture of shaft of left ulna, initial encounter for closed fracture: Secondary | ICD-10-CM

## 2021-01-09 DIAGNOSIS — S52232A Displaced oblique fracture of shaft of left ulna, initial encounter for closed fracture: Secondary | ICD-10-CM | POA: Diagnosis not present

## 2021-01-09 DIAGNOSIS — S52202A Unspecified fracture of shaft of left ulna, initial encounter for closed fracture: Secondary | ICD-10-CM

## 2021-01-09 DIAGNOSIS — M79632 Pain in left forearm: Secondary | ICD-10-CM
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
## 2021-01-09 NOTE — Discharge Instructions (Addendum)
Please follow-up with orthopedic surgery in the coming week Continue taking ibuprofen or Tylenol for pain If you have worsening symptoms please reach out to orthopedic surgery sooner. Avoid getting the splint wet.
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
## 2021-01-11 DIAGNOSIS — S52601A Unspecified fracture of lower end of right ulna, initial encounter for closed fracture: Secondary | ICD-10-CM
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
## 2021-01-13 ENCOUNTER — Ambulatory Visit (INDEPENDENT_AMBULATORY_CARE_PROVIDER_SITE_OTHER): Payer: PPO | Admitting: Family Medicine

## 2021-01-13 ENCOUNTER — Encounter: Payer: Self-pay | Admitting: Family Medicine

## 2021-01-13 DIAGNOSIS — R399 Unspecified symptoms and signs involving the genitourinary system: Secondary | ICD-10-CM

## 2021-01-13 DIAGNOSIS — S52202A Unspecified fracture of shaft of left ulna, initial encounter for closed fracture: Secondary | ICD-10-CM | POA: Diagnosis not present

## 2021-01-13 MED ORDER — ONDANSETRON HCL 4 MG PO TABS: 4.0000 mg | ORAL_TABLET | Freq: Three times a day (TID) | ORAL | 0 refills | Status: DC | PRN

## 2021-01-13 MED ORDER — DOXYCYCLINE HYCLATE 100 MG PO TABS: 100.0000 mg | ORAL_TABLET | Freq: Two times a day (BID) | ORAL | 0 refills | Status: AC
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
## 2021-01-28 ENCOUNTER — Other Ambulatory Visit: Payer: PPO

## 2021-01-29 ENCOUNTER — Inpatient Hospital Stay: Payer: PPO | Attending: Oncology

## 2021-01-29 ENCOUNTER — Other Ambulatory Visit: Payer: Self-pay

## 2021-01-29 DIAGNOSIS — D500 Iron deficiency anemia secondary to blood loss (chronic): Secondary | ICD-10-CM | POA: Insufficient documentation

## 2021-01-29 DIAGNOSIS — K922 Gastrointestinal hemorrhage, unspecified: Secondary | ICD-10-CM | POA: Insufficient documentation

## 2021-01-29 DIAGNOSIS — Z79899 Other long term (current) drug therapy: Secondary | ICD-10-CM | POA: Diagnosis not present

## 2021-01-29 LAB — CBC WITH DIFFERENTIAL (CANCER CENTER ONLY)
Abs Immature Granulocytes: 0.02 10*3/uL (ref 0.00–0.07)
Basophils Absolute: 0 10*3/uL (ref 0.0–0.1)
Basophils Relative: 1 %
Eosinophils Absolute: 0.2 10*3/uL (ref 0.0–0.5)
Eosinophils Relative: 3 %
HCT: 39.1 % (ref 36.0–46.0)
Hemoglobin: 12.5 g/dL (ref 12.0–15.0)
Immature Granulocytes: 0 %
Lymphocytes Relative: 33 %
Lymphs Abs: 1.8 10*3/uL (ref 0.7–4.0)
MCH: 32.9 pg (ref 26.0–34.0)
MCHC: 32 g/dL (ref 30.0–36.0)
MCV: 102.9 fL — ABNORMAL HIGH (ref 80.0–100.0)
Monocytes Absolute: 0.7 10*3/uL (ref 0.1–1.0)
Monocytes Relative: 12 %
Neutro Abs: 2.8 10*3/uL (ref 1.7–7.7)
Neutrophils Relative %: 51 %
Platelet Count: 276 10*3/uL (ref 150–400)
RBC: 3.8 MIL/uL — ABNORMAL LOW (ref 3.87–5.11)
RDW: 13.3 % (ref 11.5–15.5)
WBC Count: 5.5 10*3/uL (ref 4.0–10.5)
nRBC: 0 % (ref 0.0–0.2)

## 2021-01-29 LAB — FERRITIN: Ferritin: 136 ng/mL (ref 11–307)

## 2021-02-06 ENCOUNTER — Encounter: Payer: PPO | Admitting: Orthopedic Surgery

## 2021-02-10 ENCOUNTER — Ambulatory Visit: Payer: PPO

## 2021-02-10 ENCOUNTER — Other Ambulatory Visit: Payer: Self-pay

## 2021-02-10 ENCOUNTER — Ambulatory Visit (INDEPENDENT_AMBULATORY_CARE_PROVIDER_SITE_OTHER): Payer: PPO | Admitting: Orthopedic Surgery

## 2021-02-10 ENCOUNTER — Encounter: Payer: Self-pay | Admitting: Orthopedic Surgery

## 2021-02-10 DIAGNOSIS — S52235D Nondisplaced oblique fracture of shaft of left ulna, subsequent encounter for closed fracture with routine healing: Secondary | ICD-10-CM
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
## 2021-02-11 ENCOUNTER — Other Ambulatory Visit: Payer: Self-pay | Admitting: Gastroenterology

## 2021-02-13 ENCOUNTER — Other Ambulatory Visit: Payer: Self-pay

## 2021-02-19 ENCOUNTER — Encounter (HOSPITAL_COMMUNITY)
Admission: RE | Disposition: A | Discharge status: Home / Self Care | Payer: Self-pay | Source: Home / Self Care | Attending: Gastroenterology

## 2021-02-19 ENCOUNTER — Ambulatory Visit (HOSPITAL_COMMUNITY): Payer: PPO | Admitting: Anesthesiology

## 2021-02-19 ENCOUNTER — Other Ambulatory Visit: Payer: Self-pay

## 2021-02-19 ENCOUNTER — Ambulatory Visit (HOSPITAL_COMMUNITY)
Admission: RE | Admit: 2021-02-19 | Discharge: 2021-02-19 | Disposition: A | Discharge status: Home / Self Care | Payer: PPO | Attending: Gastroenterology | Admitting: Gastroenterology

## 2021-02-19 ENCOUNTER — Encounter (HOSPITAL_COMMUNITY): Payer: Self-pay | Admitting: Gastroenterology

## 2021-02-19 DIAGNOSIS — K9189 Other postprocedural complications and disorders of digestive system: Secondary | ICD-10-CM | POA: Insufficient documentation

## 2021-02-19 DIAGNOSIS — K2950 Unspecified chronic gastritis without bleeding: Secondary | ICD-10-CM | POA: Diagnosis not present

## 2021-02-19 DIAGNOSIS — K449 Diaphragmatic hernia without obstruction or gangrene: Secondary | ICD-10-CM | POA: Diagnosis not present

## 2021-02-19 DIAGNOSIS — Z980 Intestinal bypass and anastomosis status: Secondary | ICD-10-CM | POA: Diagnosis not present

## 2021-02-19 DIAGNOSIS — K219 Gastro-esophageal reflux disease without esophagitis: Secondary | ICD-10-CM | POA: Diagnosis not present

## 2021-02-19 HISTORY — PX: ESOPHAGOGASTRODUODENOSCOPY (EGD) WITH PROPOFOL: SHX5813

## 2021-02-19 HISTORY — PX: BALLOON DILATION: SHX5330

## 2021-02-19 SURGERY — ESOPHAGOGASTRODUODENOSCOPY (EGD) WITH PROPOFOL
Anesthesia: Monitor Anesthesia Care

## 2021-02-19 MED ORDER — LACTATED RINGERS IV SOLN
INTRAVENOUS | Status: AC | PRN
  Administered 2021-02-19: 1000 mL via INTRAVENOUS

## 2021-02-19 MED ORDER — LIDOCAINE 2% (20 MG/ML) 5 ML SYRINGE
INTRAMUSCULAR | Status: DC | PRN
  Administered 2021-02-19: 100 mg via INTRAVENOUS

## 2021-02-19 MED ORDER — PROPOFOL 10 MG/ML IV BOLUS
INTRAVENOUS | Status: DC | PRN
  Administered 2021-02-19 (×3): 20 mg via INTRAVENOUS

## 2021-02-19 MED ORDER — PROPOFOL 500 MG/50ML IV EMUL
INTRAVENOUS | Status: DC | PRN
  Administered 2021-02-19: 125 ug/kg/min via INTRAVENOUS

## 2021-02-19 MED ORDER — SODIUM CHLORIDE (PF) 0.9 % IJ SOLN
INTRAMUSCULAR | Status: AC
  Filled 2021-02-19: qty 10

## 2021-02-19 MED ORDER — PROPOFOL 500 MG/50ML IV EMUL
INTRAVENOUS | Status: AC
  Filled 2021-02-19: qty 50

## 2021-02-19 MED ORDER — ONABOTULINUMTOXINA 100 UNITS IJ SOLR
INTRAMUSCULAR | Status: AC
  Filled 2021-02-19: qty 100

## 2021-02-19 MED ORDER — SODIUM CHLORIDE 0.9 % IV SOLN: INTRAVENOUS | Status: DC

## 2021-02-19 MED ORDER — LACTATED RINGERS IV SOLN: INTRAVENOUS | Status: DC

## 2021-02-19 SURGICAL SUPPLY — 15 items

## 2021-02-19 NOTE — Progress Notes (Signed)
Brittany Hensley 12:38 PM  Subjective: Patient has not had any major medical issues since we have seen her but her upper tract symptoms have recurred we rediscussed the procedure she has no new complaints  Objective: Vital signs stable afebrile no acute distress exam please see preassessment evaluation recent hemoglobin reviewed  Assessment: Anastomotic stricture  Plan: Okay to proceed with EGD with repeat dilation and anesthesia assistance  [facility]  office [phone] After 5PM or if no answer call [phone]

## 2021-02-19 NOTE — Discharge Instructions (Addendum)
Call if question or problem otherwise start with soft solids and slowly advance diet tomorrow and follow-up as needed or in 6 months per our routine   YOU HAD AN ENDOSCOPIC PROCEDURE TODAY: Refer to the procedure report and other information in the discharge instructions given to you for any specific questions about what was found during the examination. If this information does not answer your questions, please call [facility] office at [phone] to clarify.   YOU SHOULD EXPECT: Some feelings of bloating in the abdomen. Passage of more gas than usual. Walking can help get rid of the air that was put into your GI tract during the procedure and reduce the bloating. If you had a lower endoscopy (such as a colonoscopy or flexible sigmoidoscopy) you may notice spotting of blood in your stool or on the toilet paper. Some abdominal soreness may be present for a day or two, also.  DIET: Your first meal following the procedure should be a light meal and then it is ok to progress to your normal diet. A half-sandwich or bowl of soup is an example of a good first meal. Heavy or fried foods are harder to digest and may make you feel nauseous or bloated. Drink plenty of fluids but you should avoid alcoholic beverages for 24 hours. If you had a esophageal dilation, please see attached instructions for diet.    ACTIVITY: Your care partner should take you home directly after the procedure. You should plan to take it easy, moving slowly for the rest of the day. You can resume normal activity the day after the procedure however YOU SHOULD NOT DRIVE, use power tools, machinery or perform tasks that involve climbing or major physical exertion for 24 hours (because of the sedation medicines used during the test).   SYMPTOMS TO REPORT IMMEDIATELY: A gastroenterologist can be reached at any hour. Please call [phone]  for any of the following symptoms:  Following lower endoscopy (colonoscopy, flexible  sigmoidoscopy) Excessive amounts of blood in the stool  Significant tenderness, worsening of abdominal pains  Swelling of the abdomen that is new, acute  Fever of 100 or higher  Following upper endoscopy (EGD, EUS, ERCP, esophageal dilation) Vomiting of blood or coffee ground material  New, significant abdominal pain  New, significant chest pain or pain under the shoulder blades  Painful or persistently difficult swallowing  New shortness of breath  Black, tarry-looking or red, bloody stools  FOLLOW UP:  If any biopsies were taken you will be contacted by phone or by letter within the next 1-3 weeks. Call [phone]  if you have not heard about the biopsies in 3 weeks.  Please also call with any specific questions about appointments or follow up tests. YOU HAD AN ENDOSCOPIC PROCEDURE TODAY: Refer to the procedure report and other information in the discharge instructions given to you for any specific questions about what was found during the examination. If this information does not answer your questions, please call [facility] office at [phone] to clarify.   YOU SHOULD EXPECT: Some feelings of bloating in the abdomen. Passage of more gas than usual. Walking can help get rid of the air that was put into your GI tract during the procedure and reduce the bloating. If you had a lower endoscopy (such as a colonoscopy or flexible sigmoidoscopy) you may notice spotting of blood in your stool or on the toilet paper. Some abdominal soreness may be present for a day or two, also.  DIET: Your first  meal following the procedure should be a light meal and then it is ok to progress to your normal diet. A half-sandwich or bowl of soup is an example of a good first meal. Heavy or fried foods are harder to digest and may make you feel nauseous or bloated. Drink plenty of fluids but you should avoid alcoholic beverages for 24 hours. If you had a esophageal dilation, please see attached instructions for diet.     ACTIVITY: Your care partner should take you home directly after the procedure. You should plan to take it easy, moving slowly for the rest of the day. You can resume normal activity the day after the procedure however YOU SHOULD NOT DRIVE, use power tools, machinery or perform tasks that involve climbing or major physical exertion for 24 hours (because of the sedation medicines used during the test).   SYMPTOMS TO REPORT IMMEDIATELY: A gastroenterologist can be reached at any hour. Please call [phone]  for any of the following symptoms:  Following lower endoscopy (colonoscopy, flexible sigmoidoscopy) Excessive amounts of blood in the stool  Significant tenderness, worsening of abdominal pains  Swelling of the abdomen that is new, acute  Fever of 100 or higher  Following upper endoscopy (EGD, EUS, ERCP, esophageal dilation) Vomiting of blood or coffee ground material  New, significant abdominal pain  New, significant chest pain or pain under the shoulder blades  Painful or persistently difficult swallowing  New shortness of breath  Black, tarry-looking or red, bloody stools  FOLLOW UP:  If any biopsies were taken you will be contacted by phone or by letter within the next 1-3 weeks. Call [phone]  if you have not heard about the biopsies in 3 weeks.  Please also call with any specific questions about appointments or follow up tests.

## 2021-02-19 NOTE — Anesthesia Postprocedure Evaluation (Signed)
Anesthesia Post Note  Patient: Brittany Hensley  Procedure(s) Performed: ESOPHAGOGASTRODUODENOSCOPY (EGD) WITH PROPOFOL BALLOON DILATION     Patient location during evaluation: PACU Anesthesia Type: MAC Level of consciousness: awake and alert Pain management: pain level controlled Vital Signs Assessment: post-procedure vital signs reviewed and stable Respiratory status: spontaneous breathing, nonlabored ventilation, respiratory function stable and patient connected to nasal cannula oxygen Cardiovascular status: stable and blood pressure returned to baseline Postop Assessment: no apparent nausea or vomiting Anesthetic complications: no   No notable events documented.  Last Vitals:  Vitals:   02/19/21 1310 02/19/21 1320  BP: 108/66 115/74  Pulse: 76 82  Resp: 10 16  Temp:    SpO2: 99% 98%    Last Pain:  Vitals:   02/19/21 1320  TempSrc:   PainSc: 0-No pain                 [name] S

## 2021-02-19 NOTE — Anesthesia Procedure Notes (Signed)
Date/Time: 02/19/2021 12:41 PM Performed by: Sharlette Dense, CRNA Oxygen Delivery Method: Simple face mask

## 2021-02-19 NOTE — Op Note (Signed)
[facility] Patient Name: Brittany Hensley Procedure Date: 02/19/2021 MRN: [id number] Attending MD: Clarene Essex , MD Date of Birth: [birth year] CSN: [id number] Age: 70 Admit Type: Outpatient Procedure:                Upper GI endoscopy Indications:              For therapy of post-surgical anastomotic stenosis Providers:                Clarene Essex, MD, Jobe Igo, RN, Laverda Sorenson,                            Technician, Danley Danker, CRNA Referring MD:              Medicines:                Propofol total dose 120 mg IV, 100 mg IV lidocaine Complications:            No immediate complications. Estimated Blood Loss:     Estimated blood loss: none. Procedure:                Pre-Anesthesia Assessment:                           - Prior to the procedure, a History and Physical                            was performed, and patient medications and                            allergies were reviewed. The patient's tolerance of                            previous anesthesia was also reviewed. The risks                            and benefits of the procedure and the sedation                            options and risks were discussed with the patient.                            All questions were answered, and informed consent                            was obtained. Prior Anticoagulants: The patient has                            taken no previous anticoagulant or antiplatelet                            agents. ASA Grade Assessment: II - A patient with                            mild systemic disease. After reviewing the risks  and benefits, the patient was deemed in                            satisfactory condition to undergo the procedure.                           After obtaining informed consent, the endoscope was                            passed under direct vision. Throughout the                            procedure, the patient's blood  pressure, pulse, and                            oxygen saturations were monitored continuously. The                            GIF-H190 ([id number]) Olympus gastroscope was                            introduced through the mouth, and advanced to the                            jejunum. The upper GI endoscopy was accomplished                            without difficulty. The patient tolerated the                            procedure well. Scope In: Scope Out: Findings:      A small hiatal hernia was present.      Diffuse mild inflammation characterized by congestion (edema) and       erythema was found in the entire examined stomach.      Evidence of a patent Billroth I gastroduodenostomy was found. A gastric       pouch was found. The gastroduodenal anastomosis was characterized by       healthy appearing mucosa. This was traversed. A TTS dilator was passed       through the scope. Dilation with an 18-19-20 mm balloon dilator was       performed to 20 mm. The dilation site was examined and showed mild       mucosal disruption.      The examined jejunum was normal.      The exam was otherwise without abnormality. Impression:               - Small hiatal hernia.                           - Chronic gastritis.                           - Patent Billroth I gastroduodenostomy was found,  characterized by healthy appearing mucosa. Dilated.                           - Normal examined jejunum.                           - The examination was otherwise normal.                           - No specimens collected. Moderate Sedation:      Not Applicable - Patient had care per Anesthesia. Recommendation:           - Patient has a contact number available for                            emergencies. The signs and symptoms of potential                            delayed complications were discussed with the                            patient. Return to normal activities tomorrow.                             Written discharge instructions were provided to the                            patient.                           - Soft diet today.                           - Continue present medications.                           - Return to GI clinic in 6 months.                           - Telephone GI clinic if symptomatic PRN. Procedure Code(s):        --- Professional ---                           [id number], Esophagogastroduodenoscopy, flexible,                            transoral; with dilation of gastric/duodenal                            stricture(s) (eg, balloon, bougie) Diagnosis Code(s):        --- Professional ---                           K44.9, Diaphragmatic hernia without obstruction or                            gangrene  K29.50, Unspecified chronic gastritis without                            bleeding                           Z98.0, Intestinal bypass and anastomosis status                           K91.89, Other postprocedural complications and                            disorders of digestive system CPT copyright 2019 [employer]. All rights reserved. The codes documented in this report are preliminary and upon coder review may  be revised to meet current compliance requirements. Clarene Essex, MD 02/19/2021 1:14:21 PM This report has been signed electronically. Number of Addenda: 0

## 2021-02-19 NOTE — Transfer of Care (Signed)
Immediate Anesthesia Transfer of Care Note  Patient: Brittany Hensley  Procedure(s) Performed: ESOPHAGOGASTRODUODENOSCOPY (EGD) WITH PROPOFOL BALLOON DILATION  Patient Location: Endoscopy Unit  Anesthesia Type:MAC  Level of Consciousness: awake and alert   Airway & Oxygen Therapy: Patient Spontanous Breathing and Patient connected to face mask oxygen  Post-op Assessment: Report given to RN and Post -op Vital signs reviewed and stable  Post vital signs: Reviewed and stable  Last Vitals:  Vitals Value Taken Time  BP    Temp    Pulse 78 02/19/21 1257  Resp 13 02/19/21 1257  SpO2 100 % 02/19/21 1257  Vitals shown include unvalidated device data.  Last Pain:  Vitals:   02/19/21 1110  TempSrc: Oral  PainSc:          Complications: No notable events documented.

## 2021-02-19 NOTE — Anesthesia Preprocedure Evaluation (Signed)
Anesthesia Evaluation  Patient identified by MRN, date of birth, ID band Patient awake    Reviewed: Allergy & Precautions, NPO status , Patient's Chart, lab work & pertinent test results  Airway Mallampati: II  TM Distance: >3 FB Neck ROM: Full    Dental no notable dental hx.    Pulmonary neg pulmonary ROS,    Pulmonary exam normal breath sounds clear to auscultation       Cardiovascular negative cardio ROS Normal cardiovascular exam Rhythm:Regular Rate:Normal     Neuro/Psych negative neurological ROS  negative psych ROS   GI/Hepatic Neg liver ROS, GERD  Medicated,  Endo/Other  Hypothyroidism   Renal/GU negative Renal ROS  negative genitourinary   Musculoskeletal negative musculoskeletal ROS (+)   Abdominal   Peds negative pediatric ROS (+)  Hematology negative hematology ROS (+)   Anesthesia Other Findings   Reproductive/Obstetrics negative OB ROS                             Anesthesia Physical Anesthesia Plan  ASA: 2  Anesthesia Plan: MAC   Post-op Pain Management:    Induction: Intravenous  PONV Risk Score and Plan: 2 and Propofol infusion and Treatment may vary due to age or medical condition  Airway Management Planned: Simple Face Mask  Additional Equipment:   Intra-op Plan:   Post-operative Plan:   Informed Consent: I have reviewed the patients History and Physical, chart, labs and discussed the procedure including the risks, benefits and alternatives for the proposed anesthesia with the patient or authorized representative who has indicated his/her understanding and acceptance.     Dental advisory given  Plan Discussed with: CRNA and Surgeon  Anesthesia Plan Comments:         Anesthesia Quick Evaluation

## 2021-03-09 ENCOUNTER — Other Ambulatory Visit: Payer: Self-pay | Admitting: Nurse Practitioner

## 2021-03-09 DIAGNOSIS — D500 Iron deficiency anemia secondary to blood loss (chronic): Secondary | ICD-10-CM

## 2021-03-10 ENCOUNTER — Other Ambulatory Visit: Payer: Self-pay | Admitting: Radiology

## 2021-03-10 MED ORDER — GABAPENTIN 100 MG PO CAPS: 100.0000 mg | ORAL_CAPSULE | Freq: Every day | ORAL | 2 refills | Status: DC

## 2021-03-10 NOTE — Telephone Encounter (Signed)
Refill request received via fax for Gabapentin   

## 2021-03-20 DIAGNOSIS — S52236D Nondisplaced oblique fracture of shaft of unspecified ulna, subsequent encounter for closed fracture with routine healing: Secondary | ICD-10-CM | POA: Insufficient documentation

## 2021-03-24 ENCOUNTER — Ambulatory Visit: Payer: PPO

## 2021-03-24 ENCOUNTER — Other Ambulatory Visit: Payer: Self-pay

## 2021-03-24 ENCOUNTER — Ambulatory Visit (INDEPENDENT_AMBULATORY_CARE_PROVIDER_SITE_OTHER): Payer: PPO | Admitting: Orthopedic Surgery

## 2021-03-24 VITALS — Wt 105.0 lb

## 2021-03-24 DIAGNOSIS — S52235D Nondisplaced oblique fracture of shaft of left ulna, subsequent encounter for closed fracture with routine healing: Secondary | ICD-10-CM

## 2021-03-24 DIAGNOSIS — M5410 Radiculopathy, site unspecified: Secondary | ICD-10-CM

## 2021-03-24 MED ORDER — GABAPENTIN 100 MG PO CAPS: 100.0000 mg | ORAL_CAPSULE | Freq: Every day | ORAL | 5 refills | Status: DC

## 2021-03-24 NOTE — Progress Notes (Signed)
Chief Complaint  Patient presents with   fracture care    Left wrist//follow up w/ xrays injury date 01/03/21    10 weeks 5 days post night stick inj right d ulna  Xray f/u  She denies pain   Exam  N/t fx site  No pain w/ pron and sup   Xrays show frx healing w min displ  D/c ret to nl activ   Meds ordered this encounter  Medications   gabapentin (NEURONTIN) 100 MG capsule    Sig: Take 1 capsule (100 mg total) by mouth at bedtime.    Dispense:  30 capsule    Refill:  5   Encounter Diagnoses  Name Primary?   Closed nondisplaced oblique fracture of shaft of left ulna with routine healing, subsequent encounter 01/03/21    Radicular pain of right lower extremity Yes

## 2021-03-30 ENCOUNTER — Other Ambulatory Visit: Payer: Self-pay | Admitting: Nurse Practitioner

## 2021-04-11 ENCOUNTER — Other Ambulatory Visit: Payer: Self-pay | Admitting: Nurse Practitioner

## 2021-04-11 DIAGNOSIS — D500 Iron deficiency anemia secondary to blood loss (chronic): Secondary | ICD-10-CM

## 2021-04-14 ENCOUNTER — Other Ambulatory Visit: Payer: Self-pay

## 2021-04-14 ENCOUNTER — Emergency Department (HOSPITAL_COMMUNITY)
Admission: EM | Admit: 2021-04-14 | Discharge: 2021-04-14 | Disposition: A | Discharge status: Home / Self Care | Payer: PPO | Attending: Emergency Medicine | Admitting: Emergency Medicine

## 2021-04-14 ENCOUNTER — Encounter (HOSPITAL_COMMUNITY): Payer: Self-pay | Admitting: Emergency Medicine

## 2021-04-14 ENCOUNTER — Emergency Department (HOSPITAL_COMMUNITY): Payer: PPO

## 2021-04-14 DIAGNOSIS — R319 Hematuria, unspecified: Secondary | ICD-10-CM | POA: Diagnosis not present

## 2021-04-14 DIAGNOSIS — D500 Iron deficiency anemia secondary to blood loss (chronic): Secondary | ICD-10-CM

## 2021-04-14 DIAGNOSIS — N200 Calculus of kidney: Secondary | ICD-10-CM | POA: Insufficient documentation

## 2021-04-14 DIAGNOSIS — E039 Hypothyroidism, unspecified: Secondary | ICD-10-CM | POA: Insufficient documentation

## 2021-04-14 DIAGNOSIS — I700 Atherosclerosis of aorta: Secondary | ICD-10-CM | POA: Diagnosis not present

## 2021-04-14 DIAGNOSIS — M47817 Spondylosis without myelopathy or radiculopathy, lumbosacral region: Secondary | ICD-10-CM | POA: Diagnosis not present

## 2021-04-14 DIAGNOSIS — Q632 Ectopic kidney: Secondary | ICD-10-CM | POA: Diagnosis not present

## 2021-04-14 LAB — CBC WITH DIFFERENTIAL/PLATELET
Abs Immature Granulocytes: 0.02 10*3/uL (ref 0.00–0.07)
Basophils Absolute: 0.1 10*3/uL (ref 0.0–0.1)
Basophils Relative: 1 %
Eosinophils Absolute: 0.2 10*3/uL (ref 0.0–0.5)
Eosinophils Relative: 2 %
HCT: 40.9 % (ref 36.0–46.0)
Hemoglobin: 13.1 g/dL (ref 12.0–15.0)
Immature Granulocytes: 0 %
Lymphocytes Relative: 33 %
Lymphs Abs: 2 10*3/uL (ref 0.7–4.0)
MCH: 32.7 pg (ref 26.0–34.0)
MCHC: 32 g/dL (ref 30.0–36.0)
MCV: 102 fL — ABNORMAL HIGH (ref 80.0–100.0)
Monocytes Absolute: 0.7 10*3/uL (ref 0.1–1.0)
Monocytes Relative: 11 %
Neutro Abs: 3.2 10*3/uL (ref 1.7–7.7)
Neutrophils Relative %: 53 %
Platelets: 238 10*3/uL (ref 150–400)
RBC: 4.01 MIL/uL (ref 3.87–5.11)
RDW: 13.3 % (ref 11.5–15.5)
WBC: 6.1 10*3/uL (ref 4.0–10.5)
nRBC: 0 % (ref 0.0–0.2)

## 2021-04-14 LAB — COMPREHENSIVE METABOLIC PANEL
ALT: 23 U/L (ref 0–44)
AST: 20 U/L (ref 15–41)
Albumin: 3.9 g/dL (ref 3.5–5.0)
Alkaline Phosphatase: 66 U/L (ref 38–126)
Anion gap: 7 (ref 5–15)
BUN: 21 mg/dL (ref 8–23)
CO2: 21 mmol/L — ABNORMAL LOW (ref 22–32)
Calcium: 9.2 mg/dL (ref 8.9–10.3)
Chloride: 110 mmol/L (ref 98–111)
Creatinine, Ser: 0.99 mg/dL (ref 0.44–1.00)
GFR, Estimated: >60 mL/min (ref >60)
Glucose, Bld: 110 mg/dL — ABNORMAL HIGH (ref 70–99)
Potassium: 3.6 mmol/L (ref 3.5–5.1)
Sodium: 138 mmol/L (ref 135–145)
Total Bilirubin: 0.4 mg/dL (ref 0.3–1.2)
Total Protein: 6.5 g/dL (ref 6.5–8.1)

## 2021-04-14 LAB — URINALYSIS, MICROSCOPIC (REFLEX)
Bacteria, UA: NONE SEEN
RBC / HPF: >50 RBC/hpf (ref 0–5)
Squamous Epithelial / HPF: NONE SEEN (ref 0–5)

## 2021-04-14 LAB — URINALYSIS, ROUTINE W REFLEX MICROSCOPIC
Bilirubin Urine: NEGATIVE
Glucose, UA: NEGATIVE mg/dL
Ketones, ur: NEGATIVE mg/dL
Nitrite: NEGATIVE
Specific Gravity, Urine: 1.02 (ref 1.005–1.030)
pH: 5 (ref 5.0–8.0)

## 2021-04-14 MED ORDER — CEPHALEXIN 500 MG PO CAPS
500.0000 mg | ORAL_CAPSULE | Freq: Two times a day (BID) | ORAL | 0 refills | Status: AC
Start: 2021-04-14 — End: 2021-04-21

## 2021-04-14 NOTE — Discharge Instructions (Signed)
You do have bilateral stones in your kidneys, but none that are within your ureters, so it is unclear if this is the source of the blood in your urine.  Stones that are in your kidneys should not be causing pain or bleeding.  You are being covered with an antibiotic it is possible you do have an infection as there is a lot of blood in your urine so hard to interpret this test with accuracy.  You will need follow-up care, you may call Dr. Jeffie Pollock, or Dr. Noland Fordyce partner who is our on-call urologist today.  In the interim get rechecked for any new or worsening symptoms.

## 2021-04-14 NOTE — ED Notes (Signed)
Patient transported to CT 

## 2021-04-14 NOTE — ED Triage Notes (Signed)
Pt c/o urinating blood that started yesterday.

## 2021-04-14 NOTE — ED Provider Notes (Signed)
[facility] EMERGENCY DEPARTMENT Provider Note   CSN: [id number] Arrival date & time: 04/14/21  1011     History Chief Complaint  Patient presents with   Hematuria    Brittany Hensley is a 70 y.o. female with a history as outlined below, most significant for distant history of kidney stones requiring lithotripsy occurring more than 10 years ago, she was under the care of Dr. Jeffie Pollock at that time.  She presents today for evaluation of hematuria that started yesterday.  She also describes a pressure like sensation in her right lower back.  She denies painful urination but does endorse frequent urination.  She has had no fevers or chills denies nausea or vomiting.  She does have some mild suprapubic full sensation but denies pain.  She has had no treatment prior to arrival for her symptoms.  The history is provided by the patient.      Past Medical History:  Diagnosis Date   Anemia    iron    Anxiety    Diverticulitis    Hx: of   GERD (gastroesophageal reflux disease)    at times   History of kidney stones    10 years ago   Hypothyroidism    Low iron    Pneumonia    Rosacea    Hx: of    Patient Active Problem List   Diagnosis Date Noted   Closed nondisplaced oblique fracture of shaft of ulna with routine healing 03/20/2021   Closed fracture of lower end of right ulna 01/11/2021   Nausea and vomiting 12/19/2020   Oral phase dysphagia 12/19/2020   Slow transit constipation 12/19/2020   Weight decreased 12/19/2020   Atrophic gastritis 12/19/2020   Hematochezia 12/19/2020   Acquired hypertrophic pyloric stenosis 12/19/2020   Osteoporosis without pathological fracture 06/18/2020   Anemia, unspecified 06/27/2018   Age-related osteoporosis with current pathological fracture of right femur ([place]) 03/01/2018   Status post total hip replacement, left 02/17/18 02/17/2018   Closed displaced fracture of left femoral neck ([place]) 02/14/2018   S/P hip replacement, right 12/30/17  01/13/2018   Closed displaced fracture of right femoral neck ([place]) 12/30/2017   BMI less than 19,adult 01/10/2016   GAD (generalized anxiety disorder) 01/10/2016   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   Hypothyroidism 04/24/2013   GERD (gastroesophageal reflux disease) 04/24/2013   Rosacea 04/24/2013   Iron deficiency anemia due to chronic blood loss 06/09/2012   Gastric outlet obstruction 06/16/2011   Atrioventricular nodal re-entry tachycardia ([place]) 03/11/2010   MURMUR 03/11/2010   PERSONAL HISTORY OF URINARY CALCULI 02/14/2008    Past Surgical History:  Procedure Laterality Date   APPENDECTOMY     BALLOON DILATION  06/16/2011   Procedure: BALLOON DILATION;  Surgeon: Jeryl Columbia, MD;  Location: [facility] ENDOSCOPY;  Service: Endoscopy;  Laterality: N/A;   BALLOON DILATION N/A 10/03/2013   Procedure: BALLOON DILATION;  Surgeon: Jeryl Columbia, MD;  Location: [facility] ENDOSCOPY;  Service: Endoscopy;  Laterality: N/A;   BALLOON DILATION N/A 08/10/2014   Procedure: BALLOON DILATION;  Surgeon: Jeryl Columbia, MD;  Location: [facility] ENDOSCOPY;  Service: Endoscopy;  Laterality: N/A;  from 12 - 15 cm dilation completed   BALLOON DILATION N/A 02/15/2015   Procedure: BALLOON DILATION;  Surgeon: Clarene Essex, MD;  Location: [facility] ENDOSCOPY;  Service: Endoscopy;  Laterality: N/A;   BALLOON DILATION N/A 11/12/2015   Procedure: BALLOON DILATION;  Surgeon: Clarene Essex, MD;  Location: [facility] ENDOSCOPY;  Service: Endoscopy;  Laterality: N/A;   BALLOON  DILATION N/A 08/10/2016   Procedure: BALLOON DILATION;  Surgeon: Clarene Essex, MD;  Location: [facility] ENDOSCOPY;  Service: Endoscopy;  Laterality: N/A;   BALLOON DILATION N/A 08/07/2016   Procedure: BALLOON DILATION;  Surgeon: Clarene Essex, MD;  Location: [facility] ENDOSCOPY;  Service: Endoscopy;  Laterality: N/A;   BALLOON DILATION N/A 05/20/2017   Procedure: BALLOON DILATION;  Surgeon: Clarene Essex, MD;  Location: [facility] ENDOSCOPY;  Service: Endoscopy;  Laterality: N/A;   BALLOON DILATION N/A 12/02/2017    Procedure: BALLOON DILATION;  Surgeon: Clarene Essex, MD;  Location: [place];  Service: Endoscopy;  Laterality: N/A;   BALLOON DILATION N/A 07/19/2018   Procedure: BALLOON DILATION;  Surgeon: Clarene Essex, MD;  Location: [facility] ENDOSCOPY;  Service: Endoscopy;  Laterality: N/A;   BALLOON DILATION N/A 01/23/2019   Procedure: BALLOON DILATION;  Surgeon: Clarene Essex, MD;  Location: [facility]OSCOPY;  Service: Endoscopy;  Laterality: N/A;   BALLOON DILATION N/A 07/13/2019   Procedure: BALLOON DILATION;  Surgeon: Clarene Essex, MD;  Location: [facility];  Service: Endoscopy;  Laterality: N/A;   BALLOON DILATION N/A 02/02/2020   Procedure: BALLOON DILATION;  Surgeon: Clarene Essex, MD;  Location: [facility];  Service: Endoscopy;  Laterality: N/A;   BALLOON DILATION N/A 07/16/2020   Procedure: BALLOON DILATION;  Surgeon: Clarene Essex, MD;  Location: [facility];  Service: Endoscopy;  Laterality: N/A;   BALLOON DILATION N/A 02/19/2021   Procedure: BALLOON DILATION;  Surgeon: Clarene Essex, MD;  Location: [facility]OS[facility];  Service: Endoscopy;  Laterality: N/A;   CATARACT EXTRACTION, BILATERAL     CHOLECYSTECTOMY OPEN  1979   COLONOSCOPY     Hx: of   ESOPHAGOGASTRODUODENOSCOPY  06/16/2011   Procedure: ESOPHAGOGASTRODUODENOSCOPY (EGD);  Surgeon: Jeryl Columbia, MD;  Location: [facility] ENDOSCOPY;  Service: Endoscopy;  Laterality: N/A;   ESOPHAGOGASTRODUODENOSCOPY N/A 10/03/2013   Procedure: ESOPHAGOGASTRODUODENOSCOPY (EGD);  Surgeon: Jeryl Columbia, MD;  Location: [facility];  Service: Endoscopy;  Laterality: N/A;   ESOPHAGOGASTRODUODENOSCOPY N/A 08/10/2014   Procedure: ESOPHAGOGASTRODUODENOSCOPY (EGD);  Surgeon: Jeryl Columbia, MD;  Location: [facility] ENDOSCOPY;  Service: Endoscopy;  Laterality: N/A;   ESOPHAGOGASTRODUODENOSCOPY N/A 08/10/2016   Procedure: ESOPHAGOGASTRODUODENOSCOPY (EGD);  Surgeon: Clarene Essex, MD;  Location: [facility] ENDOSCOPY;  Service: Endoscopy;  Laterality: N/A;   ESOPHAGOGASTRODUODENOSCOPY N/A 07/19/2018   Procedure:  ESOPHAGOGASTRODUODENOSCOPY (EGD);  Surgeon: Clarene Essex, MD;  Location: [facility] ENDOSCOPY;  Service: Endoscopy;  Laterality: N/A;   ESOPHAGOGASTRODUODENOSCOPY (EGD) WITH PROPOFOL N/A 02/15/2015   Procedure: ESOPHAGOGASTRODUODENOSCOPY (EGD) WITH PROPOFOL;  Surgeon: Clarene Essex, MD;  Location: [facility] ENDOSCOPY;  Service: Endoscopy;  Laterality: N/A;   ESOPHAGOGASTRODUODENOSCOPY (EGD) WITH PROPOFOL N/A 11/12/2015   Procedure: ESOPHAGOGASTRODUODENOSCOPY (EGD) WITH PROPOFOL;  Surgeon: Clarene Essex, MD;  Location: [facility];  Service: Endoscopy;  Laterality: N/A;   ESOPHAGOGASTRODUODENOSCOPY (EGD) WITH PROPOFOL N/A 08/07/2016   Procedure: ESOPHAGOGASTRODUODENOSCOPY (EGD) WITH PROPOFOL;  Surgeon: Clarene Essex, MD;  Location: [facility] ENDOSCOPY;  Service: Endoscopy;  Laterality: N/A;   ESOPHAGOGASTRODUODENOSCOPY (EGD) WITH PROPOFOL N/A 05/20/2017   Procedure: ESOPHAGOGASTRODUODENOSCOPY (EGD) WITH PROPOFOL;  Surgeon: Clarene Essex, MD;  Location: [facility];  Service: Endoscopy;  Laterality: N/A;   ESOPHAGOGASTRODUODENOSCOPY (EGD) WITH PROPOFOL N/A 12/02/2017   Procedure: ESOPHAGOGASTRODUODENOSCOPY (EGD) WITH PROPOFOL;  Surgeon: Clarene Essex, MD;  Location: [place];  Service: Endoscopy;  Laterality: N/A;  have c arm available   ESOPHAGOGASTRODUODENOSCOPY (EGD) WITH PROPOFOL N/A 01/23/2019   Procedure: ESOPHAGOGASTRODUODENOSCOPY (EGD) WITH PROPOFOL;  Surgeon: Clarene Essex, MD;  Location: [facility]OS[facility];  Service: Endoscopy;  Laterality: N/A;   ESOPHAGOGASTRODUODENOSCOPY (EGD) WITH PROPOFOL  N/A 07/13/2019   Procedure: ESOPHAGOGASTRODUODENOSCOPY (EGD) WITH PROPOFOL;  Surgeon: Clarene Essex, MD;  Location: [facility];  Service: Endoscopy;  Laterality: N/A;   ESOPHAGOGASTRODUODENOSCOPY (EGD) WITH PROPOFOL N/A 02/02/2020   Procedure: ESOPHAGOGASTRODUODENOSCOPY (EGD) WITH PROPOFOL;  Surgeon: Clarene Essex, MD;  Location: [facility];  Service: Endoscopy;  Laterality: N/A;   ESOPHAGOGASTRODUODENOSCOPY (EGD) WITH PROPOFOL N/A 07/16/2020    Procedure: ESOPHAGOGASTRODUODENOSCOPY (EGD) WITH PROPOFOL;  Surgeon: Clarene Essex, MD;  Location: [facility];  Service: Endoscopy;  Laterality: N/A;   ESOPHAGOGASTRODUODENOSCOPY (EGD) WITH PROPOFOL N/A 02/19/2021   Procedure: ESOPHAGOGASTRODUODENOSCOPY (EGD) WITH PROPOFOL;  Surgeon: Clarene Essex, MD;  Location: [facility];  Service: Endoscopy;  Laterality: N/A;  with possible botox   FOREIGN BODY REMOVAL  07/19/2018   Procedure: FOREIGN BODY REMOVAL;  Surgeon: Clarene Essex, MD;  Location: [facility] ENDOS[facility];  Service: Endoscopy;;   FRACTURE SURGERY     Hx: of left wrist   GASTRECTOMY  [phone]   GASTRECTOMY N/A    X3   LITHOTRIPSY     Hx; of for kidney stones   SAVORY DILATION N/A 08/10/2014   Procedure: SAVORY DILATION;  Surgeon: Jeryl Columbia, MD;  Location: [facility] ENDOS[facility];  Service: Endoscopy;  Laterality: N/A;   TOTAL HIP ARTHROPLASTY Right 12/30/2017   Procedure: RIGHT TOTAL HIP ARTHROPLASTY;  Surgeon: Carole Civil, MD;  Location: [facility];  Service: Orthopedics;  Laterality: Right;   TOTAL HIP ARTHROPLASTY Left 02/17/2018   Procedure: TOTAL HIP ARTHROPLASTY;  Surgeon: Carole Civil, MD;  Location: [facility];  Service: Orthopedics;  Laterality: Left;   TUBAL LIGATION     1975     OB History   No obstetric history on file.     Family History  Problem Relation Age of Onset   Diabetes Mother    Cancer - Prostate Brother     Social History   Tobacco Use   Smoking status: Never   Smokeless tobacco: Never  Vaping Use   Vaping Use: Never used  Substance Use Topics   Alcohol use: No   Drug use: No    Home Medications Prior to Admission medications   Medication Sig Start Date End Date Taking? Authorizing Provider  cephALEXin (KEFLEX) 500 MG capsule Take 1 capsule (500 mg total) by mouth 2 (two) times daily for 7 days. 04/14/21 04/21/21 Yes Elisabeth Strom, Almyra Free, PA-C  acetaminophen (TYLENOL) 500 MG tablet Take 500-1,000 mg by mouth See admin instructions. Take 500 mg int he morning  and 1000 mg at  bedtime    [provider]  albuterol (VENTOLIN HFA) 108 (90 Base) MCG/ACT inhaler USE 2 PUFFS EVERY 6 HOURS AS NEEDED Patient taking differently: Inhale 2 puffs into the lungs every 6 (six) hours as needed for shortness of breath. 01/01/21   Hassell Done, Mary-Margaret, FNP  ALPRAZolam Duanne Moron) 0.5 MG tablet Take 1 tablet (0.5 mg total) by mouth 4 (four) times daily as needed for anxiety. 11/01/20   Hassell Done, Mary-Margaret, FNP  Ascorbic Acid (VITAMIN C) 1000 MG tablet Take 1,000 mg by mouth daily.    [provider]  b complex vitamins capsule Take 1 capsule by mouth daily.    [provider]  Biotin 10 MG CAPS Take 10 mg by mouth daily.    [provider]  BLACK ELDERBERRY PO Take 50 mg by mouth daily.    [provider]  Calcium Carbonate (CALCIUM 600 PO) Take 600 mg by mouth 2 (two) times daily.     [provider]  Cholecalciferol (VITAMIN  D) 2000 units tablet Take 2,000 Units by mouth 2 (two) times daily.    [provider]  diclofenac Sodium (VOLTAREN) 1 % GEL Apply 4 g topically 4 (four) times daily. Patient taking differently: Apply 4 g topically daily as needed (pain). 10/14/20   Carole Civil, MD  docusate sodium (COLACE) 100 MG capsule Take 1 capsule (100 mg total) by mouth 2 (two) times daily. Patient taking differently: Take 100 mg by mouth daily. 01/03/18   Carole Civil, MD  Flaxseed, Linseed, (FLAXSEED OIL) 1200 MG CAPS Take 1,200 mg by mouth daily.    [provider]  folic acid (FOLVITE) [id number] MCG tablet Take 1 tablet (800 mcg total) by mouth daily. 11/01/20   Hassell Done, Mary-Margaret, FNP  gabapentin (NEURONTIN) 100 MG capsule Take 1 capsule (100 mg total) by mouth at bedtime. 03/24/21   Carole Civil, MD  levothyroxine (SYNTHROID) 112 MCG tablet Take 1 tablet (112 mcg total) by mouth daily. Patient taking differently: Take 112 mcg by mouth daily before breakfast. 11/04/20   Hassell Done, Mary-Margaret,  FNP  loratadine (CLARITIN) 10 MG tablet TAKE ONE (1) TABLET EACH DAY Patient taking differently: Take 10 mg by mouth daily. 11/01/20   Hassell Done, Mary-Margaret, FNP  magnesium gluconate (MAGONATE) 500 MG tablet Take 500 mg by mouth 2 (two) times daily.    [provider]  Multiple Vitamins-Minerals (CENTRUM SILVER) CHEW Chew 1 tablet by mouth 2 (two) times daily.     [provider]  omeprazole (PRILOSEC) 40 MG capsule Take 1 capsule (40 mg total) by mouth daily. 11/01/20   Hassell Done, Mary-Margaret, FNP  ondansetron (ZOFRAN) 4 MG tablet Take 1 tablet (4 mg total) by mouth every 8 (eight) hours as needed for nausea or vomiting. 01/13/21   Gwenlyn Perking, FNP  ondansetron (ZOFRAN-ODT) 4 MG disintegrating tablet TAKE 1 TABLET EVERY 8 HOURS AS NEEDED FOR NAUSEA AND VOMITING 03/31/21   Hassell Done, Mary-Margaret, FNP  Polyethyl Glycol-Propyl Glycol (SYSTANE OP) Place 1 drop into both eyes daily as needed (dry eyes).    [provider]  Probiotic Product (PROBIOTIC PO) Take 1 capsule by mouth daily.     [provider]  sertraline (ZOLOFT) 100 MG tablet Take 2 tablets (200 mg total) by mouth daily. Patient taking differently: Take 100 mg by mouth at bedtime. 11/01/20   Hassell Done Mary-Margaret, FNP  sucralfate (CARAFATE) 1 g tablet TAKE ONE TABLET THREE TIMES A DAY WITH MEALS 04/14/21   Hassell Done, Mary-Margaret, FNP    Allergies    Augmentin [amoxicillin-pot clavulanate], Famotidine, Klonopin [clonazepam], Nitrofurantoin, Reglan [metoclopramide], and Sulfamethoxazole-trimethoprim  Review of Systems   Review of Systems  Constitutional:  Negative for chills and fever.  HENT: Negative.    Eyes: Negative.   Respiratory:  Negative for chest tightness and shortness of breath.   Cardiovascular:  Negative for chest pain.  Gastrointestinal:  Negative for abdominal distention, abdominal pain, nausea and vomiting.  Genitourinary:  Positive for frequency and hematuria. Negative for dysuria and  urgency.  Musculoskeletal:  Positive for back pain. Negative for arthralgias, joint swelling and neck pain.  Skin: Negative.  Negative for rash and wound.  Neurological:  Negative for dizziness, weakness, light-headedness, numbness and headaches.  Psychiatric/Behavioral: Negative.    All other systems reviewed and are negative.  Physical Exam Updated Vital Signs BP (!) 143/78 (BP Location: Right Arm)   Pulse 88   Temp 98 F (36.7 C) (Oral)   Resp 14   Ht '5\' 5"'$  (1.651 m)  Wt 47.6 kg   SpO2 98%   BMI 17.47 kg/m   Physical Exam Vitals and nursing note reviewed.  Constitutional:      General: She is not in acute distress.    Appearance: She is well-developed.     Comments: Patient appears comfortable.  HENT:     Head: Normocephalic and atraumatic.  Eyes:     Conjunctiva/sclera: Conjunctivae normal.  Cardiovascular:     Rate and Rhythm: Normal rate and regular rhythm.     Heart sounds: Normal heart sounds.  Pulmonary:     Effort: Pulmonary effort is normal.     Breath sounds: Normal breath sounds. No wheezing.  Abdominal:     General: Bowel sounds are normal. There is no distension or abdominal bruit.     Palpations: Abdomen is soft.     Tenderness: There is no abdominal tenderness. There is no right CVA tenderness, left CVA tenderness or guarding.  Musculoskeletal:        General: Normal range of motion.     Cervical back: Normal range of motion.     Lumbar back: No deformity or tenderness.  Skin:    General: Skin is warm and dry.  Neurological:     Mental Status: She is alert.    ED Results / Procedures / Treatments   Labs (all labs ordered are listed, but only abnormal results are displayed) Labs Reviewed  CBC WITH DIFFERENTIAL/PLATELET - Abnormal; Notable for the following components:      Result Value   MCV 102.0 (*)    All other components within normal limits  COMPREHENSIVE METABOLIC PANEL - Abnormal; Notable for the following components:   CO2 21 (*)     Glucose, Bld 110 (*)    All other components within normal limits  URINALYSIS, ROUTINE W REFLEX MICROSCOPIC - Abnormal; Notable for the following components:   Hgb urine dipstick LARGE (*)    Protein, ur TRACE (*)    Leukocytes,Ua SMALL (*)    All other components within normal limits  URINALYSIS, MICROSCOPIC (REFLEX)    EKG None  Radiology CT Renal Stone Study  Result Date: 04/14/2021 CLINICAL DATA:  Flank pain. EXAM: CT RENAL STONE PROTOCOL TECHNIQUE: Multidetector CT imaging of the abdomen and pelvis was performed following the standard protocol without IV contrast. COMPARISON:  CT abdomen and pelvis, 06/05/2011 and 04/02/2008. FINDINGS: Lower chest: Trace basilar atelectasis. 3 mm LEFT lower lobe pulmonary nodule. Hepatobiliary: No focal liver abnormality is seen. Status post cholecystectomy and hepaticojejunostomy. No biliary dilatation. Pancreas: Normal noncontrast appearance. No pancreatic ductal dilatation or surrounding inflammatory changes. Spleen: Normal in size without focal abnormality. Adrenals/Urinary Tract: *Adrenal glands are unremarkable. *Mild RIGHT rotation of renal anomaly.  No hydronephrosis. *Multiple nonobstructing RIGHT renal collecting system nephroliths, largest measuring up to 11 mm in greatest dimension. *LEFT nonobstructing renal calculi, largest measuring up to 8 mm in greatest dimension. *No large bladder stone *Limited evaluation of the pelvis secondary to streak and beam hardening artifact from hip arthroplasties. Stomach/Bowel: Fundoplication and Roux-en-Y gastric bypass with intact-appearing staple lines. Nonobstructed small bowel. Appendix is not definitively visualized. Nondilated colon. No evidence of bowel wall thickening, distention, or inflammatory changes. Vascular/Lymphatic: Aortic atherosclerosis. No enlarged abdominal or pelvic lymph nodes. Reproductive: Uterus and bilateral adnexa are unremarkable. Other: No abdominal wall hernia or abnormality. No  abdominopelvic ascites. Musculoskeletal: Hip arthroplasties, with similar appearance of proud supra-acetabular screws. No periprosthetic fracture. Multilevel degenerative changes of spine, greatest at L5-S1. No acute or significant osseous findings.  IMPRESSION: 1. Bilateral nonobstructing nephrolithiasis, largest measuring 11 mm RIGHT and 8 mm LEFT. No hydronephrosis. Limited evaluation of the pelvis secondary to streak and beam hardening artifact from hip arthroplasties. 2. 3 mm LEFT lower lobe pulmonary nodule. No follow-up needed if patient is low-risk. Non-contrast chest CT can be considered in 12 months if patient is high-risk. This recommendation follows the consensus statement: Guidelines for Management of Incidental Pulmonary Nodules Detected on CT Images: From the Fleischner Society 2017; Radiology 2017; 284:228-243. 3. Additional chronic and senescent changes, as above. Electronically Signed   By: Michaelle Birks M.D.   On: 04/14/2021 13:42    Procedures Procedures   Medications Ordered in ED Medications - No data to display  ED Course  I have reviewed the triage vital signs and the nursing notes.  Pertinent labs & imaging results that were available during my care of the patient were reviewed by me and considered in my medical decision making (see chart for details).    MDM Rules/Calculators/A&P                           Patient's imaging and labs reviewed and discussed with her.  She does not have an active ureteral stone, although the CT scan is limited in her pelvis secondary to hip arthroplasties.  It is possible that she has a small ureteral stone hidden by this artifact.  She has been fairly comfortable throughout the course of her ED stay.  She does have increased urinary frequency, it is possible that she actually has a hemorrhagic cystitis.  We we also discussed the large stones within her kidneys, but she has no hydronephrosis, also suggesting no active ureteral stone.  She was  placed on antibiotics to cover her for possibility of acute cystitis, urine culture was also ordered.  She was advised close follow-up with urology for further evaluation of her symptoms.  She will call tomorrow for an appointment.  Other CT findings including pulmonary nodule was noted, patient is low risk, she has never been a smoker, no follow-up indicated. Final Clinical Impression(s) / ED Diagnoses Final diagnoses:  Hematuria, unspecified type  Kidney stones    Rx / DC Orders ED Discharge Orders          Ordered    cephALEXin (KEFLEX) 500 MG capsule  2 times daily        04/14/21 1434             Evalee Jefferson, PA-C 04/16/21 0141    Milton Ferguson, MD 04/17/21 [phone]

## 2021-04-24 ENCOUNTER — Other Ambulatory Visit: Payer: Self-pay

## 2021-04-24 ENCOUNTER — Encounter: Payer: Self-pay | Admitting: Urology

## 2021-04-24 ENCOUNTER — Ambulatory Visit (INDEPENDENT_AMBULATORY_CARE_PROVIDER_SITE_OTHER): Payer: PPO | Admitting: Urology

## 2021-04-24 VITALS — BP 132/70 | HR 94 | Temp 98.5°F | Ht 65.0 in | Wt 103.6 lb

## 2021-04-24 DIAGNOSIS — N200 Calculus of kidney: Secondary | ICD-10-CM

## 2021-04-24 DIAGNOSIS — R310 Gross hematuria: Secondary | ICD-10-CM | POA: Diagnosis not present

## 2021-04-24 LAB — URINALYSIS, ROUTINE W REFLEX MICROSCOPIC
Bilirubin, UA: NEGATIVE
Glucose, UA: NEGATIVE
Nitrite, UA: NEGATIVE
Specific Gravity, UA: 1.02 (ref 1.005–1.030)
Urobilinogen, Ur: 0.2 mg/dL (ref 0.2–1.0)
pH, UA: 6 (ref 5.0–7.5)

## 2021-04-24 LAB — MICROSCOPIC EXAMINATION
Epithelial Cells (non renal): >10 /hpf — AB (ref 0–10)
RBC, Urine: >30 /hpf — AB (ref 0–2)
Renal Epithel, UA: NONE SEEN /hpf

## 2021-04-24 NOTE — H&P (View-Only) (Signed)
Subjective: 1. Gross hematuria   2. Kidney stones      Consult requested by Evalee Jefferson PA  Brittany Hensley is a former patient with a history of stones who I last saw in 2009.  She was in the ER on 04/14/21 with gross hematuria and a CT shows bilateral non-obstructing renal stones with a malrotated right kidney.   There is a partial staghorn stone in the LLP that has multiple small stones making it up.  The stones are larger on the right and distributed throughout the kidney.  The bladder is not well seen because of beam hardening from bilateral hip implants.  She has no other voiding complaints.  Her UA today has >30 RBC with WBC and bacteria as well.  ROS:  ROS  Allergies  Allergen Reactions   Augmentin [Amoxicillin-Pot Clavulanate] Other (See Comments)    Bloody stool Did it involve swelling of the face/tongue/throat, SOB, or low BP? No Did it involve sudden or severe rash/hives, skin peeling, or any reaction on the inside of your mouth or nose? No Did you need to seek medical attention at a hospital or doctor's office? No When did it last happen?      1 year If all above answers are "NO", may proceed with cephalosporin use.    Famotidine Other (See Comments)    Fever    Klonopin [Clonazepam] Other (See Comments)    double vision   Nitrofurantoin Rash   Reglan [Metoclopramide] Anxiety and Other (See Comments)    Causes confusion   Sulfamethoxazole-Trimethoprim Rash    Past Medical History:  Diagnosis Date   Anemia    iron    Anxiety    Arthritis    Diverticulitis    Hx: of   GERD (gastroesophageal reflux disease)    at times   History of kidney stones    10 years ago   Hypothyroidism    Low iron    Pneumonia    Rosacea    Hx: of    Past Surgical History:  Procedure Laterality Date   APPENDECTOMY     BALLOON DILATION  06/16/2011   Procedure: BALLOON DILATION;  Surgeon: Jeryl Columbia, MD;  Location: [facility] ENDOSCOPY;  Service: Endoscopy;  Laterality: N/A;   BALLOON  DILATION N/A 10/03/2013   Procedure: BALLOON DILATION;  Surgeon: Jeryl Columbia, MD;  Location: [facility] ENDOSCOPY;  Service: Endoscopy;  Laterality: N/A;   BALLOON DILATION N/A 08/10/2014   Procedure: BALLOON DILATION;  Surgeon: Jeryl Columbia, MD;  Location: [facility] ENDOSCOPY;  Service: Endoscopy;  Laterality: N/A;  from 12 - 15 cm dilation completed   BALLOON DILATION N/A 02/15/2015   Procedure: BALLOON DILATION;  Surgeon: Clarene Essex, MD;  Location: [facility] ENDOSCOPY;  Service: Endoscopy;  Laterality: N/A;   BALLOON DILATION N/A 11/12/2015   Procedure: BALLOON DILATION;  Surgeon: Clarene Essex, MD;  Location: [facility]OSCOPY;  Service: Endoscopy;  Laterality: N/A;   BALLOON DILATION N/A 08/10/2016   Procedure: BALLOON DILATION;  Surgeon: Clarene Essex, MD;  Location: [facility] ENDOSCOPY;  Service: Endoscopy;  Laterality: N/A;   BALLOON DILATION N/A 08/07/2016   Procedure: BALLOON DILATION;  Surgeon: Clarene Essex, MD;  Location: [facility] ENDOSCOPY;  Service: Endoscopy;  Laterality: N/A;   BALLOON DILATION N/A 05/20/2017   Procedure: BALLOON DILATION;  Surgeon: Clarene Essex, MD;  Location: [facility] ENDOSCOPY;  Service: Endoscopy;  Laterality: N/A;   BALLOON DILATION N/A 12/02/2017   Procedure: BALLOON DILATION;  Surgeon: Clarene Essex, MD;  Location: [facility];  Service:  Endoscopy;  Laterality: N/A;   BALLOON DILATION N/A 07/19/2018   Procedure: BALLOON DILATION;  Surgeon: Clarene Essex, MD;  Location: [facility] ENDOSCOPY;  Service: Endoscopy;  Laterality: N/A;   BALLOON DILATION N/A 01/23/2019   Procedure: BALLOON DILATION;  Surgeon: Clarene Essex, MD;  Location: [facility]OSCOPY;  Service: Endoscopy;  Laterality: N/A;   BALLOON DILATION N/A 07/13/2019   Procedure: BALLOON DILATION;  Surgeon: Clarene Essex, MD;  Location: [facility]OS[facility];  Service: Endoscopy;  Laterality: N/A;   BALLOON DILATION N/A 02/02/2020   Procedure: BALLOON DILATION;  Surgeon: Clarene Essex, MD;  Location: [facility];  Service: Endoscopy;  Laterality: N/A;   BALLOON DILATION N/A 07/16/2020    Procedure: BALLOON DILATION;  Surgeon: Clarene Essex, MD;  Location: [facility];  Service: Endoscopy;  Laterality: N/A;   BALLOON DILATION N/A 02/19/2021   Procedure: BALLOON DILATION;  Surgeon: Clarene Essex, MD;  Location: [facility]OSCOPY;  Service: Endoscopy;  Laterality: N/A;   CATARACT EXTRACTION, BILATERAL     CHOLECYSTECTOMY OPEN  1979   COLONOSCOPY     Hx: of   ESOPHAGOGASTRODUODENOSCOPY  06/16/2011   Procedure: ESOPHAGOGASTRODUODENOSCOPY (EGD);  Surgeon: Jeryl Columbia, MD;  Location: [facility] ENDOSCOPY;  Service: Endoscopy;  Laterality: N/A;   ESOPHAGOGASTRODUODENOSCOPY N/A 10/03/2013   Procedure: ESOPHAGOGASTRODUODENOSCOPY (EGD);  Surgeon: Jeryl Columbia, MD;  Location: [facility] ENDOSCOPY;  Service: Endoscopy;  Laterality: N/A;   ESOPHAGOGASTRODUODENOSCOPY N/A 08/10/2014   Procedure: ESOPHAGOGASTRODUODENOSCOPY (EGD);  Surgeon: Jeryl Columbia, MD;  Location: [facility] ENDOSCOPY;  Service: Endoscopy;  Laterality: N/A;   ESOPHAGOGASTRODUODENOSCOPY N/A 08/10/2016   Procedure: ESOPHAGOGASTRODUODENOSCOPY (EGD);  Surgeon: Clarene Essex, MD;  Location: [facility] ENDOSCOPY;  Service: Endoscopy;  Laterality: N/A;   ESOPHAGOGASTRODUODENOSCOPY N/A 07/19/2018   Procedure: ESOPHAGOGASTRODUODENOSCOPY (EGD);  Surgeon: Clarene Essex, MD;  Location: [facility] ENDOSCOPY;  Service: Endoscopy;  Laterality: N/A;   ESOPHAGOGASTRODUODENOSCOPY (EGD) WITH PROPOFOL N/A 02/15/2015   Procedure: ESOPHAGOGASTRODUODENOSCOPY (EGD) WITH PROPOFOL;  Surgeon: Clarene Essex, MD;  Location: [facility]OSCOPY;  Service: Endoscopy;  Laterality: N/A;   ESOPHAGOGASTRODUODENOSCOPY (EGD) WITH PROPOFOL N/A 11/12/2015   Procedure: ESOPHAGOGASTRODUODENOSCOPY (EGD) WITH PROPOFOL;  Surgeon: Clarene Essex, MD;  Location: [facility]OS[facility];  Service: Endoscopy;  Laterality: N/A;   ESOPHAGOGASTRODUODENOSCOPY (EGD) WITH PROPOFOL N/A 08/07/2016   Procedure: ESOPHAGOGASTRODUODENOSCOPY (EGD) WITH PROPOFOL;  Surgeon: Clarene Essex, MD;  Location: [facility] ENDOSCOPY;  Service: Endoscopy;  Laterality: N/A;    ESOPHAGOGASTRODUODENOSCOPY (EGD) WITH PROPOFOL N/A 05/20/2017   Procedure: ESOPHAGOGASTRODUODENOSCOPY (EGD) WITH PROPOFOL;  Surgeon: Clarene Essex, MD;  Location: [facility];  Service: Endoscopy;  Laterality: N/A;   ESOPHAGOGASTRODUODENOSCOPY (EGD) WITH PROPOFOL N/A 12/02/2017   Procedure: ESOPHAGOGASTRODUODENOSCOPY (EGD) WITH PROPOFOL;  Surgeon: Clarene Essex, MD;  Location: [facility];  Service: Endoscopy;  Laterality: N/A;  have c arm available   ESOPHAGOGASTRODUODENOSCOPY (EGD) WITH PROPOFOL N/A 01/23/2019   Procedure: ESOPHAGOGASTRODUODENOSCOPY (EGD) WITH PROPOFOL;  Surgeon: Clarene Essex, MD;  Location: [facility]OS[facility];  Service: Endoscopy;  Laterality: N/A;   ESOPHAGOGASTRODUODENOSCOPY (EGD) WITH PROPOFOL N/A 07/13/2019   Procedure: ESOPHAGOGASTRODUODENOSCOPY (EGD) WITH PROPOFOL;  Surgeon: Clarene Essex, MD;  Location: [facility];  Service: Endoscopy;  Laterality: N/A;   ESOPHAGOGASTRODUODENOSCOPY (EGD) WITH PROPOFOL N/A 02/02/2020   Procedure: ESOPHAGOGASTRODUODENOSCOPY (EGD) WITH PROPOFOL;  Surgeon: Clarene Essex, MD;  Location: [facility];  Service: Endoscopy;  Laterality: N/A;   ESOPHAGOGASTRODUODENOSCOPY (EGD) WITH PROPOFOL N/A 07/16/2020   Procedure: ESOPHAGOGASTRODUODENOSCOPY (EGD) WITH PROPOFOL;  Surgeon: Clarene Essex, MD;  Location: [facility];  Service: Endoscopy;  Laterality: N/A;   ESOPHAGOGASTRODUODENOSCOPY (EGD) WITH PROPOFOL N/A 02/19/2021   Procedure: ESOPHAGOGASTRODUODENOSCOPY (EGD)  WITH PROPOFOL;  Surgeon: Clarene Essex, MD;  Location: [facility] ENDOS[facility];  Service: Endoscopy;  Laterality: N/A;  with possible botox   FOREIGN BODY REMOVAL  07/19/2018   Procedure: FOREIGN BODY REMOVAL;  Surgeon: Clarene Essex, MD;  Location: [facility] ENDOSCOPY;  Service: Endoscopy;;   FRACTURE SURGERY     Hx: of left wrist   GASTRECTOMY  [phone]   GASTRECTOMY N/A    X3   LITHOTRIPSY     Hx; of for kidney stones   SAVORY DILATION N/A 08/10/2014   Procedure: SAVORY DILATION;  Surgeon: Jeryl Columbia, MD;   Location: [facility] ENDOSCOPY;  Service: Endoscopy;  Laterality: N/A;   TOTAL HIP ARTHROPLASTY Right 12/30/2017   Procedure: RIGHT TOTAL HIP ARTHROPLASTY;  Surgeon: Carole Civil, MD;  Location: [facility];  Service: Orthopedics;  Laterality: Right;   TOTAL HIP ARTHROPLASTY Left 02/17/2018   Procedure: TOTAL HIP ARTHROPLASTY;  Surgeon: Carole Civil, MD;  Location: [facility];  Service: Orthopedics;  Laterality: Left;   TUBAL LIGATION     1975    Social History   Socioeconomic History   Marital status: Widowed    Spouse name: Not on file   Number of children: Not on file   Years of education: Not on file   Highest education level: Not on file  Occupational History   Not on file  Tobacco Use   Smoking status: Never   Smokeless tobacco: Never  Vaping Use   Vaping Use: Never used  Substance and Sexual Activity   Alcohol use: No   Drug use: No   Sexual activity: Not Currently  Other Topics Concern   Not on file  Social History Narrative   Not on file   Social Determinants of Health   Financial Resource Strain: Not on file  Food Insecurity: Not on file  Transportation Needs: Not on file  Physical Activity: Not on file  Stress: Not on file  Social Connections: Not on file  Intimate Partner Violence: Not on file    Family History  Problem Relation Age of Onset   Diabetes Mother    Cancer - Prostate Brother     Anti-infectives: Anti-infectives (From admission, onward)    None       Current Outpatient Medications  Medication Sig Dispense Refill   acetaminophen (TYLENOL) 500 MG tablet Take 500-1,000 mg by mouth See admin instructions. Take 500 mg int he morning and 1000 mg at  bedtime     albuterol (VENTOLIN HFA) 108 (90 Base) MCG/ACT inhaler USE 2 PUFFS EVERY 6 HOURS AS NEEDED (Patient taking differently: Inhale 2 puffs into the lungs every 6 (six) hours as needed for shortness of breath.) 8.5 g 0   ALPRAZolam (XANAX) 0.5 MG tablet Take 1 tablet (0.5 mg total) by mouth  4 (four) times daily as needed for anxiety. 120 tablet 5   Ascorbic Acid (VITAMIN C) 1000 MG tablet Take 1,000 mg by mouth daily.     b complex vitamins capsule Take 1 capsule by mouth daily.     Biotin 10 MG CAPS Take 10 mg by mouth daily.     BLACK ELDERBERRY PO Take 50 mg by mouth daily.     Calcium Carbonate (CALCIUM 600 PO) Take 600 mg by mouth 2 (two) times daily.      Cholecalciferol (VITAMIN D) 2000 units tablet Take 2,000 Units by mouth 2 (two) times daily.     diclofenac Sodium (VOLTAREN) 1 % GEL Apply 4 g topically 4 (four) times  daily. (Patient taking differently: Apply 4 g topically daily as needed (pain).) 4 g 3   docusate sodium (COLACE) 100 MG capsule Take 1 capsule (100 mg total) by mouth 2 (two) times daily. (Patient taking differently: Take 100 mg by mouth daily.) 10 capsule 0   Flaxseed, Linseed, (FLAXSEED OIL) 1200 MG CAPS Take 1,200 mg by mouth daily.     folic acid (FOLVITE) 644 MCG tablet Take 1 tablet (800 mcg total) by mouth daily. 90 tablet 1   gabapentin (NEURONTIN) 100 MG capsule Take 1 capsule (100 mg total) by mouth at bedtime. 30 capsule 5   levothyroxine (SYNTHROID) 112 MCG tablet Take 1 tablet (112 mcg total) by mouth daily. (Patient taking differently: Take 112 mcg by mouth daily before breakfast.) 90 tablet 3   loratadine (CLARITIN) 10 MG tablet TAKE ONE (1) TABLET EACH DAY (Patient taking differently: Take 10 mg by mouth daily.) 90 tablet 1   magnesium gluconate (MAGONATE) 500 MG tablet Take 500 mg by mouth 2 (two) times daily.     Multiple Vitamins-Minerals (CENTRUM SILVER) CHEW Chew 1 tablet by mouth 2 (two) times daily.      omeprazole (PRILOSEC) 40 MG capsule Take 1 capsule (40 mg total) by mouth daily. 90 capsule 1   ondansetron (ZOFRAN) 4 MG tablet Take 1 tablet (4 mg total) by mouth every 8 (eight) hours as needed for nausea or vomiting. 20 tablet 0   ondansetron (ZOFRAN-ODT) 4 MG disintegrating tablet TAKE 1 TABLET EVERY 8 HOURS AS NEEDED FOR NAUSEA AND  VOMITING 30 tablet 0   Polyethyl Glycol-Propyl Glycol (SYSTANE OP) Place 1 drop into both eyes daily as needed (dry eyes).     Probiotic Product (PROBIOTIC PO) Take 1 capsule by mouth daily.      sertraline (ZOLOFT) 100 MG tablet Take 2 tablets (200 mg total) by mouth daily. (Patient taking differently: Take 100 mg by mouth at bedtime.) 180 tablet 1   sucralfate (CARAFATE) 1 g tablet TAKE ONE TABLET THREE TIMES A DAY WITH MEALS 90 tablet 0   No current facility-administered medications for this visit.     Objective: Vital signs in last 24 hours: BP 132/70 (BP Location: Left Arm, Patient Position: Sitting, Cuff Size: Normal)   Pulse 94   Temp 98.5 F (36.9 C)   Ht 5\' 5"  (1.651 m)   Wt 103 lb 9.6 oz (47 kg)   BMI 17.24 kg/m   Intake/Output from previous day: No intake/output data recorded. Intake/Output this shift: @IOTHISSHIFT @   Physical Exam Vitals reviewed.  Constitutional:      Appearance: Normal appearance.  Cardiovascular:     Rate and Rhythm: Normal rate and regular rhythm.     Heart sounds: Normal heart sounds.  Pulmonary:     Effort: Pulmonary effort is normal. No respiratory distress.     Breath sounds: Normal breath sounds.  Abdominal:     General: Abdomen is flat.     Palpations: Abdomen is soft.     Tenderness: There is no abdominal tenderness.  Musculoskeletal:        General: No swelling or tenderness. Normal range of motion.  Skin:    General: Skin is warm and dry.  Neurological:     General: No focal deficit present.     Mental Status: She is alert and oriented to person, place, and time.  Psychiatric:        Mood and Affect: Mood normal.        Behavior: Behavior normal.  Lab Results:  Results for orders placed or performed in visit on 04/24/21 (from the past 24 hour(s))  Urinalysis, Routine w reflex microscopic     Status: Abnormal   Collection Time: 04/24/21  2:43 PM  Result Value Ref Range   Specific Gravity, UA 1.020 1.005 - 1.030    pH, UA 6.0 5.0 - 7.5   Color, UA Yellow Yellow   Appearance Ur Clear Clear   Leukocytes,UA 2+ (A) Negative   Protein,UA 2+ (A) Negative/Trace   Glucose, UA Negative Negative   Ketones, UA Trace (A) Negative   RBC, UA 3+ (A) Negative   Bilirubin, UA Negative Negative   Urobilinogen, Ur 0.2 0.2 - 1.0 mg/dL   Nitrite, UA Negative Negative   Microscopic Examination See below:    Narrative   Performed at:  [facility] [address] [place], [place]  [id number] Lab Director: Mina Marble [place], Phone:  [phone]  Microscopic Examination     Status: Abnormal   Collection Time: 04/24/21  2:43 PM   Urine  Result Value Ref Range   WBC, UA 11-30 (A) 0 - 5 /hpf   RBC >30 (A) 0 - 2 /hpf   Epithelial Cells (non renal) >10 (A) 0 - 10 /hpf   Renal Epithel, UA None seen None seen /hpf   Mucus, UA Present Not Estab.   Bacteria, UA Moderate (A) None seen/Few   Narrative   Performed at:  [facility] [address], [place], [place]  [id number] Lab Director: [place], Phone:  [phone]    BMET No results for input(s): NA, K, CL, CO2, GLUCOSE, BUN, CREATININE, CALCIUM in the last 72 hours. PT/INR No results for input(s): LABPROT, INR in the last 72 hours. ABG No results for input(s): PHART, HCO3 in the last 72 hours.  Invalid input(s): PCO2, PO2  Studies/Results: No results found.   Assessment/Plan: Gross hematuria with bilateral renal stones and right renal malrotation.  There is a left lower pole partial staghorn that is composed of several smaller stones that could easily dislodge and could be contributing to the hematuria.  There are larger, multiple right renal stones as well.   She is going to need cystoscopy to assess the bladder in the face of the gross hematuria, and I think at a minimum the left lower pole stones need to be address and the left upper pole stone as well since the kidney will be accessed but if at the time of cystoscopy there  is obvious bleeding from the right ureter, I may change to that side.  This will likely be a multistage procedure based on the number and size of the stones.  I have reviewed the risks of bleeding, infection, ureteral injury, need for a stent and secondary procedures, thrombotic events and anesthetic complications.    I will order a urine culture.  No orders of the defined types were placed in this encounter.    Orders Placed This Encounter  Procedures   Microscopic Examination   Urine Culture   Urinalysis, Routine w reflex microscopic     Return for Next available I will get her scheduled for ureteroscopy and cystoscopy for her stones. .    CC: Chevis Pretty FNP.      [name] 04/24/2021 [phone]

## 2021-04-24 NOTE — Progress Notes (Signed)
Urological Symptom Review  Patient is experiencing the following symptoms: Get up at night to urinate Blood in urine   Review of Systems  Gastrointestinal (upper)  : Negative for upper GI symptoms  Gastrointestinal (lower) : Negative for lower GI symptoms  Constitutional : Negative for symptoms  Skin: Negative for skin symptoms  Eyes: Negative for eye symptoms  Ear/Nose/Throat : Negative for Ear/Nose/Throat symptoms  Hematologic/Lymphatic: Negative for Hematologic/Lymphatic symptoms  Cardiovascular : Negative for cardiovascular symptoms  Respiratory : Negative for respiratory symptoms  Endocrine: Negative for endocrine symptoms  Musculoskeletal: Negative for musculoskeletal symptoms  Neurological: Negative for neurological symptoms  Psychologic: Negative for psychiatric symptoms

## 2021-04-24 NOTE — Progress Notes (Signed)
Subjective: 1. Gross hematuria   2. Kidney stones      Consult requested by Evalee Jefferson PA  Brittany Hensley is a former patient with a history of stones who I last saw in 2009.  She was in the ER on 04/14/21 with gross hematuria and a CT shows bilateral non-obstructing renal stones with a malrotated right kidney.   There is a partial staghorn stone in the LLP that has multiple small stones making it up.  The stones are larger on the right and distributed throughout the kidney.  The bladder is not well seen because of beam hardening from bilateral hip implants.  She has no other voiding complaints.  Her UA today has >30 RBC with WBC and bacteria as well.  ROS:  ROS  Allergies  Allergen Reactions   Augmentin [Amoxicillin-Pot Clavulanate] Other (See Comments)    Bloody stool Did it involve swelling of the face/tongue/throat, SOB, or low BP? No Did it involve sudden or severe rash/hives, skin peeling, or any reaction on the inside of your mouth or nose? No Did you need to seek medical attention at a hospital or doctor's office? No When did it last happen?      1 year If all above answers are "NO", may proceed with cephalosporin use.    Famotidine Other (See Comments)    Fever    Klonopin [Clonazepam] Other (See Comments)    double vision   Nitrofurantoin Rash   Reglan [Metoclopramide] Anxiety and Other (See Comments)    Causes confusion   Sulfamethoxazole-Trimethoprim Rash    Past Medical History:  Diagnosis Date   Anemia    iron    Anxiety    Arthritis    Diverticulitis    Hx: of   GERD (gastroesophageal reflux disease)    at times   History of kidney stones    10 years ago   Hypothyroidism    Low iron    Pneumonia    Rosacea    Hx: of    Past Surgical History:  Procedure Laterality Date   APPENDECTOMY     BALLOON DILATION  06/16/2011   Procedure: BALLOON DILATION;  Surgeon: Jeryl Columbia, MD;  Location: [facility] ENDOSCOPY;  Service: Endoscopy;  Laterality: N/A;   BALLOON  DILATION N/A 10/03/2013   Procedure: BALLOON DILATION;  Surgeon: Jeryl Columbia, MD;  Location: [facility] ENDOSCOPY;  Service: Endoscopy;  Laterality: N/A;   BALLOON DILATION N/A 08/10/2014   Procedure: BALLOON DILATION;  Surgeon: Jeryl Columbia, MD;  Location: [facility] ENDOSCOPY;  Service: Endoscopy;  Laterality: N/A;  from 12 - 15 cm dilation completed   BALLOON DILATION N/A 02/15/2015   Procedure: BALLOON DILATION;  Surgeon: Clarene Essex, MD;  Location: [facility] ENDOSCOPY;  Service: Endoscopy;  Laterality: N/A;   BALLOON DILATION N/A 11/12/2015   Procedure: BALLOON DILATION;  Surgeon: Clarene Essex, MD;  Location: [facility]OSCOPY;  Service: Endoscopy;  Laterality: N/A;   BALLOON DILATION N/A 08/10/2016   Procedure: BALLOON DILATION;  Surgeon: Clarene Essex, MD;  Location: [facility] ENDOSCOPY;  Service: Endoscopy;  Laterality: N/A;   BALLOON DILATION N/A 08/07/2016   Procedure: BALLOON DILATION;  Surgeon: Clarene Essex, MD;  Location: [facility] ENDOSCOPY;  Service: Endoscopy;  Laterality: N/A;   BALLOON DILATION N/A 05/20/2017   Procedure: BALLOON DILATION;  Surgeon: Clarene Essex, MD;  Location: [facility]OSCOPY;  Service: Endoscopy;  Laterality: N/A;   BALLOON DILATION N/A 12/02/2017   Procedure: BALLOON DILATION;  Surgeon: Clarene Essex, MD;  Location: [place];  Service:  Endoscopy;  Laterality: N/A;   BALLOON DILATION N/A 07/19/2018   Procedure: BALLOON DILATION;  Surgeon: Clarene Essex, MD;  Location: [facility]OSCOPY;  Service: Endoscopy;  Laterality: N/A;   BALLOON DILATION N/A 01/23/2019   Procedure: BALLOON DILATION;  Surgeon: Clarene Essex, MD;  Location: [facility]OSCOPY;  Service: Endoscopy;  Laterality: N/A;   BALLOON DILATION N/A 07/13/2019   Procedure: BALLOON DILATION;  Surgeon: Clarene Essex, MD;  Location: [facility]OS[facility];  Service: Endoscopy;  Laterality: N/A;   BALLOON DILATION N/A 02/02/2020   Procedure: BALLOON DILATION;  Surgeon: Clarene Essex, MD;  Location: [facility];  Service: Endoscopy;  Laterality: N/A;   BALLOON DILATION N/A 07/16/2020    Procedure: BALLOON DILATION;  Surgeon: Clarene Essex, MD;  Location: [facility];  Service: Endoscopy;  Laterality: N/A;   BALLOON DILATION N/A 02/19/2021   Procedure: BALLOON DILATION;  Surgeon: Clarene Essex, MD;  Location: [facility]OSCOPY;  Service: Endoscopy;  Laterality: N/A;   CATARACT EXTRACTION, BILATERAL     CHOLECYSTECTOMY OPEN  1979   COLONOSCOPY     Hx: of   ESOPHAGOGASTRODUODENOSCOPY  06/16/2011   Procedure: ESOPHAGOGASTRODUODENOSCOPY (EGD);  Surgeon: Jeryl Columbia, MD;  Location: [facility] ENDOSCOPY;  Service: Endoscopy;  Laterality: N/A;   ESOPHAGOGASTRODUODENOSCOPY N/A 10/03/2013   Procedure: ESOPHAGOGASTRODUODENOSCOPY (EGD);  Surgeon: Jeryl Columbia, MD;  Location: [facility] ENDOSCOPY;  Service: Endoscopy;  Laterality: N/A;   ESOPHAGOGASTRODUODENOSCOPY N/A 08/10/2014   Procedure: ESOPHAGOGASTRODUODENOSCOPY (EGD);  Surgeon: Jeryl Columbia, MD;  Location: [facility] ENDOSCOPY;  Service: Endoscopy;  Laterality: N/A;   ESOPHAGOGASTRODUODENOSCOPY N/A 08/10/2016   Procedure: ESOPHAGOGASTRODUODENOSCOPY (EGD);  Surgeon: Clarene Essex, MD;  Location: [facility] ENDOSCOPY;  Service: Endoscopy;  Laterality: N/A;   ESOPHAGOGASTRODUODENOSCOPY N/A 07/19/2018   Procedure: ESOPHAGOGASTRODUODENOSCOPY (EGD);  Surgeon: Clarene Essex, MD;  Location: [facility] ENDOSCOPY;  Service: Endoscopy;  Laterality: N/A;   ESOPHAGOGASTRODUODENOSCOPY (EGD) WITH PROPOFOL N/A 02/15/2015   Procedure: ESOPHAGOGASTRODUODENOSCOPY (EGD) WITH PROPOFOL;  Surgeon: Clarene Essex, MD;  Location: [facility] ENDOSCOPY;  Service: Endoscopy;  Laterality: N/A;   ESOPHAGOGASTRODUODENOSCOPY (EGD) WITH PROPOFOL N/A 11/12/2015   Procedure: ESOPHAGOGASTRODUODENOSCOPY (EGD) WITH PROPOFOL;  Surgeon: Clarene Essex, MD;  Location: [facility]OS[facility];  Service: Endoscopy;  Laterality: N/A;   ESOPHAGOGASTRODUODENOSCOPY (EGD) WITH PROPOFOL N/A 08/07/2016   Procedure: ESOPHAGOGASTRODUODENOSCOPY (EGD) WITH PROPOFOL;  Surgeon: Clarene Essex, MD;  Location: [facility] ENDOSCOPY;  Service: Endoscopy;  Laterality: N/A;    ESOPHAGOGASTRODUODENOSCOPY (EGD) WITH PROPOFOL N/A 05/20/2017   Procedure: ESOPHAGOGASTRODUODENOSCOPY (EGD) WITH PROPOFOL;  Surgeon: Clarene Essex, MD;  Location: [facility];  Service: Endoscopy;  Laterality: N/A;   ESOPHAGOGASTRODUODENOSCOPY (EGD) WITH PROPOFOL N/A 12/02/2017   Procedure: ESOPHAGOGASTRODUODENOSCOPY (EGD) WITH PROPOFOL;  Surgeon: Clarene Essex, MD;  Location: [place];  Service: Endoscopy;  Laterality: N/A;  have c arm available   ESOPHAGOGASTRODUODENOSCOPY (EGD) WITH PROPOFOL N/A 01/23/2019   Procedure: ESOPHAGOGASTRODUODENOSCOPY (EGD) WITH PROPOFOL;  Surgeon: Clarene Essex, MD;  Location: [facility] ENDOSCOPY;  Service: Endoscopy;  Laterality: N/A;   ESOPHAGOGASTRODUODENOSCOPY (EGD) WITH PROPOFOL N/A 07/13/2019   Procedure: ESOPHAGOGASTRODUODENOSCOPY (EGD) WITH PROPOFOL;  Surgeon: Clarene Essex, MD;  Location: [facility]OS[facility];  Service: Endoscopy;  Laterality: N/A;   ESOPHAGOGASTRODUODENOSCOPY (EGD) WITH PROPOFOL N/A 02/02/2020   Procedure: ESOPHAGOGASTRODUODENOSCOPY (EGD) WITH PROPOFOL;  Surgeon: Clarene Essex, MD;  Location: [facility];  Service: Endoscopy;  Laterality: N/A;   ESOPHAGOGASTRODUODENOSCOPY (EGD) WITH PROPOFOL N/A 07/16/2020   Procedure: ESOPHAGOGASTRODUODENOSCOPY (EGD) WITH PROPOFOL;  Surgeon: Clarene Essex, MD;  Location: [facility];  Service: Endoscopy;  Laterality: N/A;   ESOPHAGOGASTRODUODENOSCOPY (EGD) WITH PROPOFOL N/A 02/19/2021   Procedure: ESOPHAGOGASTRODUODENOSCOPY (EGD)  WITH PROPOFOL;  Surgeon: Clarene Essex, MD;  Location: [facility] ENDOSCOPY;  Service: Endoscopy;  Laterality: N/A;  with possible botox   FOREIGN BODY REMOVAL  07/19/2018   Procedure: FOREIGN BODY REMOVAL;  Surgeon: Clarene Essex, MD;  Location: [facility] ENDOSCOPY;  Service: Endoscopy;;   FRACTURE SURGERY     Hx: of left wrist   GASTRECTOMY  [phone]   GASTRECTOMY N/A    X3   LITHOTRIPSY     Hx; of for kidney stones   SAVORY DILATION N/A 08/10/2014   Procedure: SAVORY DILATION;  Surgeon: Jeryl Columbia, MD;   Location: [facility] ENDOSCOPY;  Service: Endoscopy;  Laterality: N/A;   TOTAL HIP ARTHROPLASTY Right 12/30/2017   Procedure: RIGHT TOTAL HIP ARTHROPLASTY;  Surgeon: Carole Civil, MD;  Location: [facility];  Service: Orthopedics;  Laterality: Right;   TOTAL HIP ARTHROPLASTY Left 02/17/2018   Procedure: TOTAL HIP ARTHROPLASTY;  Surgeon: Carole Civil, MD;  Location: [facility];  Service: Orthopedics;  Laterality: Left;   TUBAL LIGATION     1975    Social History   Socioeconomic History   Marital status: Widowed    Spouse name: Not on file   Number of children: Not on file   Years of education: Not on file   Highest education level: Not on file  Occupational History   Not on file  Tobacco Use   Smoking status: Never   Smokeless tobacco: Never  Vaping Use   Vaping Use: Never used  Substance and Sexual Activity   Alcohol use: No   Drug use: No   Sexual activity: Not Currently  Other Topics Concern   Not on file  Social History Narrative   Not on file   Social Determinants of Health   Financial Resource Strain: Not on file  Food Insecurity: Not on file  Transportation Needs: Not on file  Physical Activity: Not on file  Stress: Not on file  Social Connections: Not on file  Intimate Partner Violence: Not on file    Family History  Problem Relation Age of Onset   Diabetes Mother    Cancer - Prostate Brother     Anti-infectives: Anti-infectives (From admission, onward)    None       Current Outpatient Medications  Medication Sig Dispense Refill   acetaminophen (TYLENOL) 500 MG tablet Take 500-1,000 mg by mouth See admin instructions. Take 500 mg int he morning and 1000 mg at  bedtime     albuterol (VENTOLIN HFA) 108 (90 Base) MCG/ACT inhaler USE 2 PUFFS EVERY 6 HOURS AS NEEDED (Patient taking differently: Inhale 2 puffs into the lungs every 6 (six) hours as needed for shortness of breath.) 8.5 g 0   ALPRAZolam (XANAX) 0.5 MG tablet Take 1 tablet (0.5 mg total) by mouth  4 (four) times daily as needed for anxiety. 120 tablet 5   Ascorbic Acid (VITAMIN C) 1000 MG tablet Take 1,000 mg by mouth daily.     b complex vitamins capsule Take 1 capsule by mouth daily.     Biotin 10 MG CAPS Take 10 mg by mouth daily.     BLACK ELDERBERRY PO Take 50 mg by mouth daily.     Calcium Carbonate (CALCIUM 600 PO) Take 600 mg by mouth 2 (two) times daily.      Cholecalciferol (VITAMIN D) 2000 units tablet Take 2,000 Units by mouth 2 (two) times daily.     diclofenac Sodium (VOLTAREN) 1 % GEL Apply 4 g topically 4 (four) times  daily. (Patient taking differently: Apply 4 g topically daily as needed (pain).) 4 g 3   docusate sodium (COLACE) 100 MG capsule Take 1 capsule (100 mg total) by mouth 2 (two) times daily. (Patient taking differently: Take 100 mg by mouth daily.) 10 capsule 0   Flaxseed, Linseed, (FLAXSEED OIL) 1200 MG CAPS Take 1,200 mg by mouth daily.     folic acid (FOLVITE) 814 MCG tablet Take 1 tablet (800 mcg total) by mouth daily. 90 tablet 1   gabapentin (NEURONTIN) 100 MG capsule Take 1 capsule (100 mg total) by mouth at bedtime. 30 capsule 5   levothyroxine (SYNTHROID) 112 MCG tablet Take 1 tablet (112 mcg total) by mouth daily. (Patient taking differently: Take 112 mcg by mouth daily before breakfast.) 90 tablet 3   loratadine (CLARITIN) 10 MG tablet TAKE ONE (1) TABLET EACH DAY (Patient taking differently: Take 10 mg by mouth daily.) 90 tablet 1   magnesium gluconate (MAGONATE) 500 MG tablet Take 500 mg by mouth 2 (two) times daily.     Multiple Vitamins-Minerals (CENTRUM SILVER) CHEW Chew 1 tablet by mouth 2 (two) times daily.      omeprazole (PRILOSEC) 40 MG capsule Take 1 capsule (40 mg total) by mouth daily. 90 capsule 1   ondansetron (ZOFRAN) 4 MG tablet Take 1 tablet (4 mg total) by mouth every 8 (eight) hours as needed for nausea or vomiting. 20 tablet 0   ondansetron (ZOFRAN-ODT) 4 MG disintegrating tablet TAKE 1 TABLET EVERY 8 HOURS AS NEEDED FOR NAUSEA AND  VOMITING 30 tablet 0   Polyethyl Glycol-Propyl Glycol (SYSTANE OP) Place 1 drop into both eyes daily as needed (dry eyes).     Probiotic Product (PROBIOTIC PO) Take 1 capsule by mouth daily.      sertraline (ZOLOFT) 100 MG tablet Take 2 tablets (200 mg total) by mouth daily. (Patient taking differently: Take 100 mg by mouth at bedtime.) 180 tablet 1   sucralfate (CARAFATE) 1 g tablet TAKE ONE TABLET THREE TIMES A DAY WITH MEALS 90 tablet 0   No current facility-administered medications for this visit.     Objective: Vital signs in last 24 hours: BP 132/70 (BP Location: Left Arm, Patient Position: Sitting, Cuff Size: Normal)   Pulse 94   Temp 98.5 F (36.9 C)   Ht 5\' 5"  (1.651 m)   Wt 103 lb 9.6 oz (47 kg)   BMI 17.24 kg/m   Intake/Output from previous day: No intake/output data recorded. Intake/Output this shift: @IOTHISSHIFT @   Physical Exam Vitals reviewed.  Constitutional:      Appearance: Normal appearance.  Cardiovascular:     Rate and Rhythm: Normal rate and regular rhythm.     Heart sounds: Normal heart sounds.  Pulmonary:     Effort: Pulmonary effort is normal. No respiratory distress.     Breath sounds: Normal breath sounds.  Abdominal:     General: Abdomen is flat.     Palpations: Abdomen is soft.     Tenderness: There is no abdominal tenderness.  Musculoskeletal:        General: No swelling or tenderness. Normal range of motion.  Skin:    General: Skin is warm and dry.  Neurological:     General: No focal deficit present.     Mental Status: She is alert and oriented to person, place, and time.  Psychiatric:        Mood and Affect: Mood normal.        Behavior: Behavior normal.  Lab Results:  Results for orders placed or performed in visit on 04/24/21 (from the past 24 hour(s))  Urinalysis, Routine w reflex microscopic     Status: Abnormal   Collection Time: 04/24/21  2:43 PM  Result Value Ref Range   Specific Gravity, UA 1.020 1.005 - 1.030    pH, UA 6.0 5.0 - 7.5   Color, UA Yellow Yellow   Appearance Ur Clear Clear   Leukocytes,UA 2+ (A) Negative   Protein,UA 2+ (A) Negative/Trace   Glucose, UA Negative Negative   Ketones, UA Trace (A) Negative   RBC, UA 3+ (A) Negative   Bilirubin, UA Negative Negative   Urobilinogen, Ur 0.2 0.2 - 1.0 mg/dL   Nitrite, UA Negative Negative   Microscopic Examination See below:    Narrative   Performed at:  [facility] [address] [place], [place]  [id number] Lab Director: Mina Marble MT, Phone:  [phone]  Microscopic Examination     Status: Abnormal   Collection Time: 04/24/21  2:43 PM   Urine  Result Value Ref Range   WBC, UA 11-30 (A) 0 - 5 /hpf   RBC >30 (A) 0 - 2 /hpf   Epithelial Cells (non renal) >10 (A) 0 - 10 /hpf   Renal Epithel, UA None seen None seen /hpf   Mucus, UA Present Not Estab.   Bacteria, UA Moderate (A) None seen/Few   Narrative   Performed at:  [facility] [address], [place], [place]  [id number] Lab Director: [place], Phone:  [phone]    BMET No results for input(s): NA, K, CL, CO2, GLUCOSE, BUN, CREATININE, CALCIUM in the last 72 hours. PT/INR No results for input(s): LABPROT, INR in the last 72 hours. ABG No results for input(s): PHART, HCO3 in the last 72 hours.  Invalid input(s): PCO2, PO2  Studies/Results: No results found.   Assessment/Plan: Gross hematuria with bilateral renal stones and right renal malrotation.  There is a left lower pole partial staghorn that is composed of several smaller stones that could easily dislodge and could be contributing to the hematuria.  There are larger, multiple right renal stones as well.   She is going to need cystoscopy to assess the bladder in the face of the gross hematuria, and I think at a minimum the left lower pole stones need to be address and the left upper pole stone as well since the kidney will be accessed but if at the time of cystoscopy there  is obvious bleeding from the right ureter, I may change to that side.  This will likely be a multistage procedure based on the number and size of the stones.  I have reviewed the risks of bleeding, infection, ureteral injury, need for a stent and secondary procedures, thrombotic events and anesthetic complications.    I will order a urine culture.  No orders of the defined types were placed in this encounter.    Orders Placed This Encounter  Procedures   Microscopic Examination   Urine Culture   Urinalysis, Routine w reflex microscopic     Return for Next available I will get her scheduled for ureteroscopy and cystoscopy for her stones. .    CC: Brittany Pretty FNP.      [name] 04/24/2021 [phone]

## 2021-04-25 DIAGNOSIS — R310 Gross hematuria: Secondary | ICD-10-CM | POA: Diagnosis not present

## 2021-04-28 ENCOUNTER — Telehealth: Payer: Self-pay

## 2021-04-28 ENCOUNTER — Other Ambulatory Visit: Payer: Self-pay | Admitting: Nurse Practitioner

## 2021-04-28 DIAGNOSIS — F411 Generalized anxiety disorder: Secondary | ICD-10-CM

## 2021-04-28 DIAGNOSIS — N390 Urinary tract infection, site not specified: Secondary | ICD-10-CM

## 2021-04-28 LAB — URINE CULTURE

## 2021-04-28 MED ORDER — DOXYCYCLINE HYCLATE 100 MG PO CAPS: 100.0000 mg | ORAL_CAPSULE | Freq: Two times a day (BID) | ORAL | 0 refills | Status: DC

## 2021-04-28 MED ORDER — DOXYCYCLINE HYCLATE 100 MG PO CAPS: 100.0000 mg | ORAL_CAPSULE | Freq: Every evening | ORAL | 2 refills | Status: DC

## 2021-04-28 NOTE — Telephone Encounter (Signed)
-----   Message from [name], MD sent at 04/28/2021 12:18 PM EDT ----- She needs doxycycline 100mg  po bid #14 and if she tolerates that, she will need doxycycline 100mg  po qhs #30 with 2 refills pending her ureteroscopy.  ----- Message ----- From: Dorisann Frames, RN Sent: 04/28/2021  10:04 AM EDT To: [name], MD  Please review - no treatment started

## 2021-04-28 NOTE — Telephone Encounter (Signed)
Prescriptions sent to pharmacy.   Called patient and left message to return call to office.

## 2021-04-29 ENCOUNTER — Other Ambulatory Visit: Payer: Self-pay | Admitting: Urology

## 2021-04-29 ENCOUNTER — Other Ambulatory Visit: Payer: Self-pay

## 2021-04-29 ENCOUNTER — Encounter (HOSPITAL_BASED_OUTPATIENT_CLINIC_OR_DEPARTMENT_OTHER): Payer: Self-pay | Admitting: Urology

## 2021-04-29 NOTE — Progress Notes (Signed)
Spoke w/ via phone for pre-op interview---pt  Lab needs dos---- n/a              Lab results------cbc,with diff and CMP 04/14/2021 COVID test -----patient states asymptomatic no test needed Arrive at ------- NPO after MN NO Solid Food.  Clear liquids from MN until---0430 05/06/2021 Med rec completed Medications to take morning of surgery -----synthroid,zoloft,Zofran,Prilosec Diabetic medication -----n/a Patient instructed no nail polish to be worn day of surgery Patient instructed to bring photo id and insurance card day of surgery Patient aware to have Driver (ride ) / caregiver Carlye Grippe sone    for 24 hours after surgery  Patient Special Instructions -----none Pre-Op special Istructions -----none Patient verbalized understanding of instructions that were given at this phone interview. Patient denies shortness of breath, chest pain, fever, cough at this phone interview.

## 2021-04-29 NOTE — Telephone Encounter (Signed)
Patient called and made aware.

## 2021-05-06 ENCOUNTER — Encounter (HOSPITAL_BASED_OUTPATIENT_CLINIC_OR_DEPARTMENT_OTHER)
Admission: RE | Disposition: A | Discharge status: Home / Self Care | Payer: Self-pay | Source: Home / Self Care | Attending: Urology

## 2021-05-06 ENCOUNTER — Ambulatory Visit (HOSPITAL_BASED_OUTPATIENT_CLINIC_OR_DEPARTMENT_OTHER): Payer: PPO | Admitting: Certified Registered Nurse Anesthetist

## 2021-05-06 ENCOUNTER — Ambulatory Visit (HOSPITAL_BASED_OUTPATIENT_CLINIC_OR_DEPARTMENT_OTHER)
Admission: RE | Admit: 2021-05-06 | Discharge: 2021-05-06 | Disposition: A | Discharge status: Home / Self Care | Payer: PPO | Attending: Urology | Admitting: Urology

## 2021-05-06 ENCOUNTER — Encounter (HOSPITAL_BASED_OUTPATIENT_CLINIC_OR_DEPARTMENT_OTHER): Payer: Self-pay | Admitting: Urology

## 2021-05-06 ENCOUNTER — Other Ambulatory Visit: Payer: Self-pay

## 2021-05-06 DIAGNOSIS — Z882 Allergy status to sulfonamides status: Secondary | ICD-10-CM | POA: Diagnosis not present

## 2021-05-06 DIAGNOSIS — F411 Generalized anxiety disorder: Secondary | ICD-10-CM | POA: Diagnosis not present

## 2021-05-06 DIAGNOSIS — Z79890 Hormone replacement therapy (postmenopausal): Secondary | ICD-10-CM | POA: Diagnosis not present

## 2021-05-06 DIAGNOSIS — N200 Calculus of kidney: Secondary | ICD-10-CM | POA: Insufficient documentation

## 2021-05-06 DIAGNOSIS — Z79899 Other long term (current) drug therapy: Secondary | ICD-10-CM | POA: Diagnosis not present

## 2021-05-06 DIAGNOSIS — Z881 Allergy status to other antibiotic agents status: Secondary | ICD-10-CM | POA: Diagnosis not present

## 2021-05-06 DIAGNOSIS — Z87442 Personal history of urinary calculi: Secondary | ICD-10-CM | POA: Insufficient documentation

## 2021-05-06 DIAGNOSIS — Z888 Allergy status to other drugs, medicaments and biological substances status: Secondary | ICD-10-CM | POA: Diagnosis not present

## 2021-05-06 DIAGNOSIS — K219 Gastro-esophageal reflux disease without esophagitis: Secondary | ICD-10-CM | POA: Diagnosis not present

## 2021-05-06 DIAGNOSIS — Z96643 Presence of artificial hip joint, bilateral: Secondary | ICD-10-CM | POA: Diagnosis not present

## 2021-05-06 DIAGNOSIS — Q632 Ectopic kidney: Secondary | ICD-10-CM | POA: Diagnosis not present

## 2021-05-06 DIAGNOSIS — R319 Hematuria, unspecified: Secondary | ICD-10-CM | POA: Diagnosis not present

## 2021-05-06 DIAGNOSIS — Z880 Allergy status to penicillin: Secondary | ICD-10-CM | POA: Diagnosis not present

## 2021-05-06 DIAGNOSIS — D500 Iron deficiency anemia secondary to blood loss (chronic): Secondary | ICD-10-CM | POA: Diagnosis not present

## 2021-05-06 HISTORY — DX: COVID-19: U07.1

## 2021-05-06 HISTORY — DX: Depression, unspecified: F32.A

## 2021-05-06 HISTORY — PX: CYSTOSCOPY/URETEROSCOPY/HOLMIUM LASER/STENT PLACEMENT: SHX6546

## 2021-05-06 SURGERY — CYSTOSCOPY/URETEROSCOPY/HOLMIUM LASER/STENT PLACEMENT
Anesthesia: General | Site: Ureter | Laterality: Bilateral

## 2021-05-06 MED ORDER — PROPOFOL 10 MG/ML IV BOLUS
INTRAVENOUS | Status: AC
  Filled 2021-05-06: qty 20

## 2021-05-06 MED ORDER — EPHEDRINE 5 MG/ML INJ
INTRAVENOUS | Status: AC
  Filled 2021-05-06: qty 5

## 2021-05-06 MED ORDER — DEXAMETHASONE SODIUM PHOSPHATE 10 MG/ML IJ SOLN
INTRAMUSCULAR | Status: DC | PRN
  Administered 2021-05-06: 5 mg via INTRAVENOUS

## 2021-05-06 MED ORDER — LIDOCAINE 2% (20 MG/ML) 5 ML SYRINGE
INTRAMUSCULAR | Status: DC | PRN
  Administered 2021-05-06: 100 mg via INTRAVENOUS

## 2021-05-06 MED ORDER — ACETAMINOPHEN 325 MG PO TABS: 650.0000 mg | ORAL_TABLET | ORAL | Status: DC | PRN

## 2021-05-06 MED ORDER — SODIUM CHLORIDE 0.9% FLUSH: 3.0000 mL | Freq: Two times a day (BID) | INTRAVENOUS | Status: DC

## 2021-05-06 MED ORDER — OXYCODONE HCL 5 MG PO TABS: 5.0000 mg | ORAL_TABLET | ORAL | Status: DC | PRN

## 2021-05-06 MED ORDER — FENTANYL CITRATE (PF) 100 MCG/2ML IJ SOLN: 25.0000 ug | INTRAMUSCULAR | Status: DC | PRN

## 2021-05-06 MED ORDER — IOHEXOL 300 MG/ML  SOLN
INTRAMUSCULAR | Status: DC | PRN
  Administered 2021-05-06: 10 mL

## 2021-05-06 MED ORDER — ONDANSETRON HCL 4 MG/2ML IJ SOLN
INTRAMUSCULAR | Status: AC
  Filled 2021-05-06: qty 2

## 2021-05-06 MED ORDER — SODIUM CHLORIDE 0.9% FLUSH: 3.0000 mL | INTRAVENOUS | Status: DC | PRN

## 2021-05-06 MED ORDER — PHENAZOPYRIDINE HCL 200 MG PO TABS: 200.0000 mg | ORAL_TABLET | Freq: Three times a day (TID) | ORAL | 1 refills | Status: DC | PRN

## 2021-05-06 MED ORDER — AMISULPRIDE (ANTIEMETIC) 5 MG/2ML IV SOLN
INTRAVENOUS | Status: AC
  Filled 2021-05-06: qty 2

## 2021-05-06 MED ORDER — CIPROFLOXACIN IN D5W 400 MG/200ML IV SOLN
400.0000 mg | Freq: Two times a day (BID) | INTRAVENOUS | Status: DC
  Administered 2021-05-06: 400 mg via INTRAVENOUS

## 2021-05-06 MED ORDER — DEXAMETHASONE SODIUM PHOSPHATE 10 MG/ML IJ SOLN
INTRAMUSCULAR | Status: AC
  Filled 2021-05-06: qty 1

## 2021-05-06 MED ORDER — SODIUM CHLORIDE 0.9 % IR SOLN
Status: DC | PRN
  Administered 2021-05-06: 3000 mL

## 2021-05-06 MED ORDER — CIPROFLOXACIN IN D5W 400 MG/200ML IV SOLN
INTRAVENOUS | Status: AC
  Filled 2021-05-06: qty 200

## 2021-05-06 MED ORDER — PHENYLEPHRINE 40 MCG/ML (10ML) SYRINGE FOR IV PUSH (FOR BLOOD PRESSURE SUPPORT)
PREFILLED_SYRINGE | INTRAVENOUS | Status: AC
  Filled 2021-05-06: qty 10

## 2021-05-06 MED ORDER — ONDANSETRON HCL 4 MG/2ML IJ SOLN
INTRAMUSCULAR | Status: DC | PRN
  Administered 2021-05-06: 4 mg via INTRAVENOUS

## 2021-05-06 MED ORDER — ARTIFICIAL TEARS OPHTHALMIC OINT
TOPICAL_OINTMENT | OPHTHALMIC | Status: AC
  Filled 2021-05-06: qty 3.5

## 2021-05-06 MED ORDER — PROPOFOL 10 MG/ML IV BOLUS
INTRAVENOUS | Status: DC | PRN
  Administered 2021-05-06: 150 mg via INTRAVENOUS

## 2021-05-06 MED ORDER — HYDROCODONE-ACETAMINOPHEN 5-325 MG PO TABS: 1.0000 | ORAL_TABLET | Freq: Four times a day (QID) | ORAL | 0 refills | Status: DC | PRN

## 2021-05-06 MED ORDER — AMISULPRIDE (ANTIEMETIC) 5 MG/2ML IV SOLN
5.0000 mg | Freq: Once | INTRAVENOUS | Status: AC
  Administered 2021-05-06: 5 mg via INTRAVENOUS

## 2021-05-06 MED ORDER — EPHEDRINE SULFATE-NACL 50-0.9 MG/10ML-% IV SOSY
PREFILLED_SYRINGE | INTRAVENOUS | Status: DC | PRN
  Administered 2021-05-06 (×3): 5 mg via INTRAVENOUS
  Administered 2021-05-06: 10 mg via INTRAVENOUS

## 2021-05-06 MED ORDER — LACTATED RINGERS IV SOLN: INTRAVENOUS | Status: DC

## 2021-05-06 MED ORDER — FENTANYL CITRATE (PF) 100 MCG/2ML IJ SOLN
INTRAMUSCULAR | Status: DC | PRN
  Administered 2021-05-06: 50 ug via INTRAVENOUS

## 2021-05-06 MED ORDER — FENTANYL CITRATE (PF) 100 MCG/2ML IJ SOLN
INTRAMUSCULAR | Status: AC
  Filled 2021-05-06: qty 2

## 2021-05-06 MED ORDER — PHENYLEPHRINE 40 MCG/ML (10ML) SYRINGE FOR IV PUSH (FOR BLOOD PRESSURE SUPPORT)
PREFILLED_SYRINGE | INTRAVENOUS | Status: DC | PRN
  Administered 2021-05-06 (×2): 80 ug via INTRAVENOUS

## 2021-05-06 MED ORDER — MORPHINE SULFATE (PF) 4 MG/ML IV SOLN: 2.0000 mg | INTRAVENOUS | Status: DC | PRN

## 2021-05-06 MED ORDER — ACETAMINOPHEN 325 MG RE SUPP: 650.0000 mg | RECTAL | Status: DC | PRN

## 2021-05-06 MED ORDER — SODIUM CHLORIDE 0.9 % IV SOLN: 250.0000 mL | INTRAVENOUS | Status: DC | PRN

## 2021-05-06 SURGICAL SUPPLY — 28 items
BAG DRAIN URO-CYSTO SKYTR STRL (DRAIN) ×2 IMPLANT
BAG DRN UROCATH (DRAIN) ×1
BASKET STONE 1.7 NGAGE (UROLOGICAL SUPPLIES) ×1 IMPLANT
BASKET ZERO TIP NITINOL 2.4FR (BASKET) ×1 IMPLANT
BSKT STON RTRVL ZERO TP 2.4FR (BASKET) ×1
CATH URET 5FR 28IN CONE TIP (BALLOONS)
CATH URET 5FR 28IN OPEN ENDED (CATHETERS) ×1 IMPLANT
CATH URET 5FR 70CM CONE TIP (BALLOONS) IMPLANT
CLOTH BEACON ORANGE TIMEOUT ST (SAFETY) ×2 IMPLANT
ELECT REM PT RETURN 9FT ADLT (ELECTROSURGICAL)
ELECTRODE REM PT RTRN 9FT ADLT (ELECTROSURGICAL) IMPLANT
FIBER LASER FLEXIVA 365 (UROLOGICAL SUPPLIES) IMPLANT
GLOVE SURG POLYISO LF SZ8 (GLOVE) ×2 IMPLANT
GOWN STRL REUS W/TWL XL LVL3 (GOWN DISPOSABLE) ×2 IMPLANT
GUIDEWIRE ANG ZIPWIRE 038X150 (WIRE) IMPLANT
GUIDEWIRE STR DUAL SENSOR (WIRE) ×2 IMPLANT
INFUSOR MANOMETER BAG 3000ML (MISCELLANEOUS) IMPLANT
IV NS IRRIG 3000ML ARTHROMATIC (IV SOLUTION) ×2 IMPLANT
KIT TURNOVER CYSTO (KITS) ×2 IMPLANT
MANIFOLD NEPTUNE II (INSTRUMENTS) ×2 IMPLANT
NS IRRIG 500ML POUR BTL (IV SOLUTION) ×2 IMPLANT
PACK CYSTO (CUSTOM PROCEDURE TRAY) ×2 IMPLANT
SHEATH URETERAL 12FRX35CM (MISCELLANEOUS) ×1 IMPLANT
STENT URET 6FRX22 CONTOUR (STENTS) ×1 IMPLANT
TRACTIP FLEXIVA PULS ID 200XHI (Laser) IMPLANT
TRACTIP FLEXIVA PULSE ID 200 (Laser) ×2
TUBE CONNECTING 12X1/4 (SUCTIONS) ×2 IMPLANT
TUBING UROLOGY SET (TUBING) IMPLANT

## 2021-05-06 NOTE — Anesthesia Preprocedure Evaluation (Signed)
Anesthesia Evaluation  Patient identified by MRN, date of birth, ID band Patient awake    Reviewed: Allergy & Precautions, NPO status , Patient's Chart, lab work & pertinent test results  Airway Mallampati: II  TM Distance: >3 FB     Dental   Pulmonary pneumonia,    breath sounds clear to auscultation       Cardiovascular  Rhythm:Regular Rate:Normal     Neuro/Psych Anxiety Depression    GI/Hepatic GERD  ,  Endo/Other  Hypothyroidism   Renal/GU      Musculoskeletal  (+) Arthritis ,   Abdominal   Peds  Hematology   Anesthesia Other Findings   Reproductive/Obstetrics                             Anesthesia Physical Anesthesia Plan  ASA: 2  Anesthesia Plan: General   Post-op Pain Management:    Induction: Intravenous  PONV Risk Score and Plan: 3 and Ondansetron, Dexamethasone and Midazolam  Airway Management Planned: LMA  Additional Equipment:   Intra-op Plan:   Post-operative Plan: Extubation in OR  Informed Consent: I have reviewed the patients History and Physical, chart, labs and discussed the procedure including the risks, benefits and alternatives for the proposed anesthesia with the patient or authorized representative who has indicated his/her understanding and acceptance.     Dental advisory given  Plan Discussed with: CRNA and Anesthesiologist  Anesthesia Plan Comments:         Anesthesia Quick Evaluation

## 2021-05-06 NOTE — Discharge Instructions (Addendum)
You may remove the stent by pulling the attached string on Friday morning.  If you don't feel you can do that, please call the office to set up a time to have it removed.      Post Anesthesia Home Care Instructions  Activity: Get plenty of rest for the remainder of the day. A responsible individual must stay with you for 24 hours following the procedure.  For the next 24 hours, DO NOT: -Drive a car -Paediatric nurse -Drink alcoholic beverages -Take any medication unless instructed by your physician -Make any legal decisions or sign important papers.  Meals: Start with liquid foods such as gelatin or soup. Progress to regular foods as tolerated. Avoid greasy, spicy, heavy foods. If nausea and/or vomiting occur, drink only clear liquids until the nausea and/or vomiting subsides. Call your physician if vomiting continues.  Special Instructions/Symptoms: Your throat may feel dry or sore from the anesthesia or the breathing tube placed in your throat during surgery. If this causes discomfort, gargle with warm salt water. The discomfort should disappear within 24 hours.

## 2021-05-06 NOTE — Transfer of Care (Signed)
Immediate Anesthesia Transfer of Care Note  Patient: Brittany Hensley  Procedure(s) Performed: CYSTOSCOPY BILATERAL RETROGRADE LEFT URETEROSCOPY/HOLMIUM LASER/STENT PLACEMENT (Bilateral: Ureter)  Patient Location: PACU  Anesthesia Type:General  Level of Consciousness: awake, alert , oriented and patient cooperative  Airway & Oxygen Therapy: Patient Spontanous Breathing  Post-op Assessment: Report given to RN and Post -op Vital signs reviewed and stable  Post vital signs: Reviewed and stable  Last Vitals:  Vitals Value Taken Time  BP 133/76 05/06/21 0901  Temp    Pulse 87 05/06/21 0904  Resp 15 05/06/21 0904  SpO2 98 % 05/06/21 0904  Vitals shown include unvalidated device data.  Last Pain:  Vitals:   05/06/21 0554  TempSrc: Oral  PainSc: 0-No pain         Complications: No notable events documented.

## 2021-05-06 NOTE — Anesthesia Postprocedure Evaluation (Signed)
Anesthesia Post Note  Patient: Brittany Hensley  Procedure(s) Performed: CYSTOSCOPY BILATERAL RETROGRADE LEFT URETEROSCOPY/HOLMIUM LASER/STENT PLACEMENT (Bilateral: Ureter)     Patient location during evaluation: PACU Anesthesia Type: General Level of consciousness: awake Pain management: pain level controlled Vital Signs Assessment: post-procedure vital signs reviewed and stable Respiratory status: spontaneous breathing Cardiovascular status: stable Postop Assessment: no apparent nausea or vomiting Anesthetic complications: no   No notable events documented.  Last Vitals:  Vitals:   05/06/21 0930 05/06/21 1015  BP: 130/61 137/76  Pulse: 84 90  Resp: 15 15  Temp: 36.5 C 36.6 C  SpO2: 97% 98%    Last Pain:  Vitals:   05/06/21 1015  TempSrc:   PainSc: 0-No pain                 Tyke Outman

## 2021-05-06 NOTE — Interval H&P Note (Signed)
History and Physical Interval Note:  She had enterococcus on her culture on 9/23 and is on doxcycline.   05/06/2021 7:24 AM  Brittany Hensley  has presented today for surgery, with the diagnosis of HEMATURIA WITH BLADDER AND RENAL STONES.  The various methods of treatment have been discussed with the patient and family. After consideration of risks, benefits and other options for treatment, the patient has consented to  Procedure(s): CYSTOSCOPY BILATERAL RETROGRADE LEFT URETEROSCOPY/HOLMIUM LASER/STENT PLACEMENT  POSSIBLE RIGHT URETEROSCOPY WITH LASER (Bilateral) as a surgical intervention.  The patient's history has been reviewed, patient examined, no change in status, stable for surgery.  I have reviewed the patient's chart and labs.  Questions were answered to the patient's satisfaction.     [name]

## 2021-05-06 NOTE — Op Note (Signed)
Procedure: 1.  Cystoscopy with bilateral retrograde pyelography and interpretation. 2.  Left ureteroscopy with holmium laser application, stone extraction and insertion of left double-J stent. 3.  Application of fluoroscopy.  Preop diagnosis: Hematuria with renal stones.  Postop diagnosis: Same.  Surgeon: Dr. [name].  Anesthesia: General.  Specimen: Stone fragments.  Drain: 6 [place] by 22 cm left contour double-J stent with tether.  EBL: None.  Complications: None.  Indications: Brittany Hensley is a 70 year old female who presented with hematuria.  She has a history of stones and a CT stone study demonstrated bilateral renal stones with right renal malrotation.  The stones on the left were most concerning because of their size and number.  It was felt that cystoscopy under anesthesia with bilateral retrograde pyelography and left ureteroscopy with stone extraction was indicated; however if I find blood effluxing from the right ureteral orifice I would proceed with right ureteroscopy.  Procedure: She had been on doxycycline preoperatively for an enterococcal UTI.  She was given Cipro in the operating room.  A general anesthetic was induced.  She was placed in lithotomy position and fitted with PAS hose.  Her perineum and genitalia were prepped with Betadine solution she was draped in usual sterile fashion.  Cystoscopy was performed with a 21 [place] scope and 30 degree lens.  Examination revealed a normal urethra.  The bladder wall was smooth and pale without tumors, stones or inflammation.  Ureteral orifices were in the normal anatomic position and effluxed clear urine.  Bilateral retrograde pyelography was performed using a 5 [place] open-ended catheter and Omnipaque diluted 50-50 with saline.  The right retrograde pyelogram demonstrated a normal ureter.  The intrarenal collecting system was slightly malformed because of the malrotation but no significant filling defects were identified other  than those associated with her known stones in the calyces.  The left retrograde pyelogram was performed.  On the precontrast views there were opacities in the upper and lower pole consistent with her known stones; however the upper pole stones were most dense.  After instillation of contrast, ureters were noted to be normal and no filling defects were identified other than those associated with the stones.  A sensor wire was then advanced to the left kidney and a 12/14 French 35 cm digital access sheath inner core was easily passed to the kidney.  This was followed by the assembled sheath.  The inner core and wire were then removed.  A dual-lumen digital flexible scope was advanced to the kidney initially inspection demonstrated multiple small stones in the lower calyces consistent with the CT findings.  The stones were removed intact with a combination of an engage basket and a 0 tip nitinol basket.  The upper pole stones were identified and one was larger and 2 big to remove intact.  It was dislodged to the renal pelvis and fragmented with a 200 m holmium laser fiber with the laser set on 0.5 J and 53 Hz on the left pedal and 1 J and 20 Hz on the right pedal.  Once fragmented, the fragments were removed.  Final inspection demonstrated only grit and sand in the collecting system and no active bleeding.  The ureteroscope was removed and a sensor wire was then reinserted to the kidney.  The access sheath was removed and a 6 [place] by 22 cm contour double-J stent was advanced to the kidney over the wire under fluoroscopic guidance.  The wire was removed, leaving good coil in the kidney and a good  coil in the bladder.  The distal end of the stent was then checked with cystoscopy and the bladder was drained.  The stent string was left exiting urethra.  It was tied close to the meatus trimmed and tucked vaginally and a tampon string fashion.  She was taken down from lithotomy position, her anesthetic was  reversed and she was moved recovery in stable condition.  There were no complications.Marland Kitchen

## 2021-05-06 NOTE — Interval H&P Note (Signed)
History and Physical Interval Note:  She continues to have hematuria but it is not as severe.   05/06/2021 7:23 AM  Brittany Hensley  has presented today for surgery, with the diagnosis of HEMATURIA WITH BLADDER AND RENAL STONES.  The various methods of treatment have been discussed with the patient and family. After consideration of risks, benefits and other options for treatment, the patient has consented to  Procedure(s): CYSTOSCOPY BILATERAL RETROGRADE LEFT URETEROSCOPY/HOLMIUM LASER/STENT PLACEMENT  POSSIBLE RIGHT URETEROSCOPY WITH LASER (Bilateral) as a surgical intervention.  The patient's history has been reviewed, patient examined, no change in status, stable for surgery.  I have reviewed the patient's chart and labs.  Questions were answered to the patient's satisfaction.     [name]

## 2021-05-06 NOTE — Anesthesia Procedure Notes (Signed)
Procedure Name: LMA Insertion Date/Time: 05/06/2021 7:39 AM Performed by: Rogers Blocker, CRNA Pre-anesthesia Checklist: Patient identified, Emergency Drugs available, Suction available and Patient being monitored Patient Re-evaluated:Patient Re-evaluated prior to induction Oxygen Delivery Method: Circle System Utilized Preoxygenation: Pre-oxygenation with 100% oxygen Induction Type: IV induction Ventilation: Mask ventilation without difficulty LMA: LMA inserted LMA Size: 3.0 Number of attempts: 1 Placement Confirmation: positive ETCO2 Tube secured with: Tape Dental Injury: Teeth and Oropharynx as per pre-operative assessment

## 2021-05-07 ENCOUNTER — Ambulatory Visit: Payer: Self-pay | Admitting: Nurse Practitioner

## 2021-05-07 ENCOUNTER — Encounter (HOSPITAL_BASED_OUTPATIENT_CLINIC_OR_DEPARTMENT_OTHER): Payer: Self-pay | Admitting: Urology

## 2021-05-21 ENCOUNTER — Other Ambulatory Visit: Payer: Self-pay | Admitting: Nurse Practitioner

## 2021-05-21 DIAGNOSIS — D500 Iron deficiency anemia secondary to blood loss (chronic): Secondary | ICD-10-CM

## 2021-05-22 ENCOUNTER — Encounter: Payer: Self-pay | Admitting: Nurse Practitioner

## 2021-05-22 ENCOUNTER — Other Ambulatory Visit: Payer: Self-pay

## 2021-05-22 ENCOUNTER — Ambulatory Visit (INDEPENDENT_AMBULATORY_CARE_PROVIDER_SITE_OTHER): Payer: PPO | Admitting: Nurse Practitioner

## 2021-05-22 VITALS — BP 122/73 | HR 82 | Temp 98.4°F | Ht 65.0 in | Wt 104.2 lb

## 2021-05-22 DIAGNOSIS — K311 Adult hypertrophic pyloric stenosis: Secondary | ICD-10-CM | POA: Diagnosis not present

## 2021-05-22 DIAGNOSIS — E034 Atrophy of thyroid (acquired): Secondary | ICD-10-CM | POA: Diagnosis not present

## 2021-05-22 DIAGNOSIS — F411 Generalized anxiety disorder: Secondary | ICD-10-CM

## 2021-05-22 DIAGNOSIS — Z681 Body mass index (BMI) 19 or less, adult: Secondary | ICD-10-CM | POA: Diagnosis not present

## 2021-05-22 DIAGNOSIS — D500 Iron deficiency anemia secondary to blood loss (chronic): Secondary | ICD-10-CM

## 2021-05-22 DIAGNOSIS — K219 Gastro-esophageal reflux disease without esophagitis: Secondary | ICD-10-CM

## 2021-05-22 MED ORDER — FOLIC ACID 800 MCG PO TABS: 800.0000 ug | ORAL_TABLET | Freq: Every day | ORAL | 1 refills | Status: DC

## 2021-05-22 MED ORDER — ALPRAZOLAM 0.5 MG PO TABS: 0.5000 mg | ORAL_TABLET | Freq: Four times a day (QID) | ORAL | 5 refills | Status: DC | PRN

## 2021-05-22 MED ORDER — OMEPRAZOLE 40 MG PO CPDR: 40.0000 mg | DELAYED_RELEASE_CAPSULE | Freq: Every day | ORAL | 1 refills | Status: DC

## 2021-05-22 MED ORDER — ERYTHROMYCIN BASE 500 MG PO TABS: ORAL_TABLET | ORAL | 1 refills | Status: DC

## 2021-05-22 MED ORDER — SERTRALINE HCL 100 MG PO TABS: 200.0000 mg | ORAL_TABLET | Freq: Every day | ORAL | 1 refills | Status: DC

## 2021-05-22 MED ORDER — SUCRALFATE 1 G PO TABS: ORAL_TABLET | ORAL | 1 refills | Status: DC

## 2021-05-22 NOTE — Progress Notes (Signed)
Subjective:    Patient ID: Brittany Hensley, female    DOB: July 08, 1951, 70 y.o.   MRN: [id number]   Chief Complaint: Medical Management of Chronic Issues    HPI:  1. Gastroesophageal reflux disease without esophagitis Is on omeprazole daily and is working well.  2. Gastric outlet obstruction Causes her appetite to be poor and trouble maintaining her weight.  3. Hypothyroidism due to acquired atrophy of thyroid No problems that she is aware of.  4. GAD (generalized anxiety disorder) Stays anxious all the time GAD 7 : Generalized Anxiety Score 05/22/2021 11/01/2020 05/03/2020 12/01/2019  Nervous, Anxious, on Edge 0 _0 Control/stop worrying 0 1 1 0  Worry too much - different things 0 1 0 1  Trouble relaxing 0 0 0 0  Restless 0 0 0 0  Easily annoyed or irritable 0 0 0 0  Afraid - awful might happen 0 0 0 0  Total GAD 7 Score 0 _1 Anxiety Difficulty - Not difficult at all Not difficult at all Not difficult at all      5. BMI less than 19,adult No recent weight changes Wt Readings from Last 3 Encounters:  05/22/21 104 lb 3.2 oz (47.3 kg)  05/06/21 105 lb 9.6 oz (47.9 kg)  04/24/21 103 lb 9.6 oz (47 kg)   BMI Readings from Last 3 Encounters:  05/22/21 17.34 kg/m  05/06/21 17.57 kg/m  04/24/21 17.24 kg/m       Outpatient Encounter Medications as of 05/22/2021  Medication Sig   acetaminophen (TYLENOL) 500 MG tablet Take 500-1,000 mg by mouth See admin instructions. Take 500 mg int he morning and 1000 mg at  bedtime   albuterol (VENTOLIN HFA) 108 (90 Base) MCG/ACT inhaler USE 2 PUFFS EVERY 6 HOURS AS NEEDED (Patient taking differently: Inhale 2 puffs into the lungs every 6 (six) hours as needed for shortness of breath.)   ALPRAZolam (XANAX) 0.5 MG tablet Take 1 tablet (0.5 mg total) by mouth 4 (four) times daily as needed for anxiety.   Ascorbic Acid (VITAMIN C) 1000 MG tablet Take 1,000 mg by mouth daily.   b complex vitamins capsule Take 1 capsule by mouth as  needed.   Biotin 10 MG CAPS Take 10 mg by mouth daily.   BLACK ELDERBERRY PO Take 50 mg by mouth daily.   Calcium Carbonate (CALCIUM 600 PO) Take 600 mg by mouth 2 (two) times daily.    Cholecalciferol (VITAMIN D) 2000 units tablet Take 2,000 Units by mouth 2 (two) times daily.   diclofenac Sodium (VOLTAREN) 1 % GEL Apply 4 g topically 4 (four) times daily. (Patient taking differently: Apply 4 g topically as needed (pain).)   docusate sodium (COLACE) 100 MG capsule Take 1 capsule (100 mg total) by mouth 2 (two) times daily. (Patient taking differently: Take 100 mg by mouth daily.)   doxycycline (VIBRAMYCIN) 100 MG capsule Take 1 capsule (100 mg total) by mouth at bedtime. To start nightly after 7 day course until surgery date   Flaxseed, Linseed, (FLAXSEED OIL) 1200 MG CAPS Take 1,200 mg by mouth daily.   folic acid (FOLVITE) 094 MCG tablet Take 1 tablet (800 mcg total) by mouth daily.   HYDROcodone-acetaminophen (NORCO) 5-325 MG tablet Take 1 tablet by mouth every 6 (six) hours as needed for moderate pain.   levothyroxine (SYNTHROID) 112 MCG tablet Take 1 tablet (112 mcg total) by mouth daily. (Patient taking differently: Take 112 mcg by mouth daily before  breakfast.)   loratadine (CLARITIN) 10 MG tablet TAKE ONE (1) TABLET EACH DAY (Patient taking differently: Take 10 mg by mouth daily.)   magnesium gluconate (MAGONATE) 500 MG tablet Take 500 mg by mouth 2 (two) times daily.   Multiple Vitamins-Minerals (CENTRUM SILVER) CHEW Chew 1 tablet by mouth 2 (two) times daily.    omeprazole (PRILOSEC) 40 MG capsule Take 1 capsule (40 mg total) by mouth daily.   ondansetron (ZOFRAN-ODT) 4 MG disintegrating tablet TAKE 1 TABLET EVERY 8 HOURS AS NEEDED FOR NAUSEA AND VOMITING (Patient taking differently: 4 mg as needed.)   phenazopyridine (PYRIDIUM) 200 MG tablet Take 1 tablet (200 mg total) by mouth 3 (three) times daily as needed for pain.   Polyethyl Glycol-Propyl Glycol (SYSTANE OP) Place 1 drop into  both eyes daily as needed (dry eyes).   Probiotic Product (PROBIOTIC PO) Take 1 capsule by mouth daily.    sertraline (ZOLOFT) 100 MG tablet Take 2 tablets (200 mg total) by mouth daily. (Patient taking differently: Take 100 mg by mouth at bedtime.)   sucralfate (CARAFATE) 1 g tablet TAKE ONE TABLET THREE TIMES A DAY WITH MEALS   No facility-administered encounter medications on file as of 05/22/2021.    Past Surgical History:  Procedure Laterality Date   APPENDECTOMY     BALLOON DILATION  06/16/2011   Procedure: BALLOON DILATION;  Surgeon: Jeryl Columbia, MD;  Location: [facility] ENDOSCOPY;  Service: Endoscopy;  Laterality: N/A;   BALLOON DILATION N/A 10/03/2013   Procedure: BALLOON DILATION;  Surgeon: Jeryl Columbia, MD;  Location: [facility] ENDOSCOPY;  Service: Endoscopy;  Laterality: N/A;   BALLOON DILATION N/A 08/10/2014   Procedure: BALLOON DILATION;  Surgeon: Jeryl Columbia, MD;  Location: [facility] ENDOSCOPY;  Service: Endoscopy;  Laterality: N/A;  from 12 - 15 cm dilation completed   BALLOON DILATION N/A 02/15/2015   Procedure: BALLOON DILATION;  Surgeon: Clarene Essex, MD;  Location: [facility] ENDOSCOPY;  Service: Endoscopy;  Laterality: N/A;   BALLOON DILATION N/A 11/12/2015   Procedure: BALLOON DILATION;  Surgeon: Clarene Essex, MD;  Location: [facility] ENDOSCOPY;  Service: Endoscopy;  Laterality: N/A;   BALLOON DILATION N/A 08/10/2016   Procedure: BALLOON DILATION;  Surgeon: Clarene Essex, MD;  Location: [facility] ENDOSCOPY;  Service: Endoscopy;  Laterality: N/A;   BALLOON DILATION N/A 08/07/2016   Procedure: BALLOON DILATION;  Surgeon: Clarene Essex, MD;  Location: [facility] ENDOSCOPY;  Service: Endoscopy;  Laterality: N/A;   BALLOON DILATION N/A 05/20/2017   Procedure: BALLOON DILATION;  Surgeon: Clarene Essex, MD;  Location: [facility]COPY;  Service: Endoscopy;  Laterality: N/A;   BALLOON DILATION N/A 12/02/2017   Procedure: BALLOON DILATION;  Surgeon: Clarene Essex, MD;  Location: [place];  Service: Endoscopy;  Laterality: N/A;   BALLOON  DILATION N/A 07/19/2018   Procedure: BALLOON DILATION;  Surgeon: Clarene Essex, MD;  Location: [facility];  Service: Endoscopy;  Laterality: N/A;   BALLOON DILATION N/A 01/23/2019   Procedure: BALLOON DILATION;  Surgeon: Clarene Essex, MD;  Location: [facility];  Service: Endoscopy;  Laterality: N/A;   BALLOON DILATION N/A 07/13/2019   Procedure: BALLOON DILATION;  Surgeon: Clarene Essex, MD;  Location: [facility];  Service: Endoscopy;  Laterality: N/A;   BALLOON DILATION N/A 02/02/2020   Procedure: BALLOON DILATION;  Surgeon: Clarene Essex, MD;  Location: [facility];  Service: Endoscopy;  Laterality: N/A;   BALLOON DILATION N/A 07/16/2020   Procedure: BALLOON DILATION;  Surgeon: Clarene Essex, MD;  Location: [facility];  Service: Endoscopy;  Laterality: N/A;  BALLOON DILATION N/A 02/19/2021   Procedure: BALLOON DILATION;  Surgeon: Clarene Essex, MD;  Location: [facility];  Service: Endoscopy;  Laterality: N/A;   CATARACT EXTRACTION, BILATERAL     2019   CHOLECYSTECTOMY OPEN  1979   COLONOSCOPY     Hx: of   CYSTOSCOPY/URETEROSCOPY/HOLMIUM LASER/STENT PLACEMENT Bilateral 05/06/2021   Procedure: CYSTOSCOPY BILATERAL RETROGRADE LEFT URETEROSCOPY/HOLMIUM LASER/STENT PLACEMENT;  Surgeon: [name], MD;  Location: [facility];  Service: Urology;  Laterality: Bilateral;   ESOPHAGOGASTRODUODENOSCOPY  06/16/2011   Procedure: ESOPHAGOGASTRODUODENOSCOPY (EGD);  Surgeon: Jeryl Columbia, MD;  Location[facility] ENDOSCOPY;  Service: Endoscopy;  Laterality: N/A;   ESOPHAGOGASTRODUODENOSCOPY N/A 10/03/2013   Procedure: ESOPHAGOGASTRODUODENOSCOPY (EGD);  Surgeon: Jeryl Columbia, MD;  Location: [facility] ENDOSCOPY;  Service: Endoscopy;  Laterality: N/A;   ESOPHAGOGASTRODUODENOSCOPY N/A 08/10/2014   Procedure: ESOPHAGOGASTRODUODENOSCOPY (EGD);  Surgeon: Jeryl Columbia, MD;  Location: [facility] ENDOSCOPY;  Service: Endoscopy;  Laterality: N/A;   ESOPHAGOGASTRODUODENOSCOPY N/A 08/10/2016   Procedure:  ESOPHAGOGASTRODUODENOSCOPY (EGD);  Surgeon: Clarene Essex, MD;  Location: [facility] ENDOSCOPY;  Service: Endoscopy;  Laterality: N/A;   ESOPHAGOGASTRODUODENOSCOPY N/A 07/19/2018   Procedure: ESOPHAGOGASTRODUODENOSCOPY (EGD);  Surgeon: Clarene Essex, MD;  Location: [facility] ENDOSCOPY;  Service: Endoscopy;  Laterality: N/A;   ESOPHAGOGASTRODUODENOSCOPY (EGD) WITH PROPOFOL N/A 02/15/2015   Procedure: ESOPHAGOGASTRODUODENOSCOPY (EGD) WITH PROPOFOL;  Surgeon: Clarene Essex, MD;  Location: [facility] ENDOS[facility];  Service: Endoscopy;  Laterality: N/A;   ESOPHAGOGASTRODUODENOSCOPY (EGD) WITH PROPOFOL N/A 11/12/2015   Procedure: ESOPHAGOGASTRODUODENOSCOPY (EGD) WITH PROPOFOL;  Surgeon: Clarene Essex, MD;  Location: [facility] ENDOS[facility];  Service: Endoscopy;  Laterality: N/A;   ESOPHAGOGASTRODUODENOSCOPY (EGD) WITH PROPOFOL N/A 08/07/2016   Procedure: ESOPHAGOGASTRODUODENOSCOPY (EGD) WITH PROPOFOL;  Surgeon: Clarene Essex, MD;  Location: [facility] ENDOSCOPY;  Service: Endoscopy;  Laterality: N/A;   ESOPHAGOGASTRODUODENOSCOPY (EGD) WITH PROPOFOL N/A 05/20/2017   Procedure: ESOPHAGOGASTRODUODENOSCOPY (EGD) WITH PROPOFOL;  Surgeon: Clarene Essex, MD;  Location: [facility] END[facility];  Service: Endoscopy;  Laterality: N/A;   ESOPHAGOGASTRODUODENOSCOPY (EGD) WITH PROPOFOL N/A 12/02/2017   Procedure: ESOPHAGOGASTRODUODENOSCOPY (EGD) WITH PROPOFOL;  Surgeon: Clarene Essex, MD;  Location: [place];  Service: Endoscopy;  Laterality: N/A;  have c arm available   ESOPHAGOGASTRODUODENOSCOPY (EGD) WITH PROPOFOL N/A 01/23/2019   Procedure: ESOPHAGOGASTRODUODENOSCOPY (EGD) WITH PROPOFOL;  Surgeon: Clarene Essex, MD;  Location: [facility] ENDOSCOPY;  Service: Endoscopy;  Laterality: N/A;   ESOPHAGOGASTRODUODENOSCOPY (EGD) WITH PROPOFOL N/A 07/13/2019   Procedure: ESOPHAGOGASTRODUODENOSCOPY (EGD) WITH PROPOFOL;  Surgeon: Clarene Essex, MD;  Location: [facility];  Service: Endoscopy;  Laterality: N/A;   ESOPHAGOGASTRODUODENOSCOPY (EGD) WITH PROPOFOL N/A 02/02/2020   Procedure:  ESOPHAGOGASTRODUODENOSCOPY (EGD) WITH PROPOFOL;  Surgeon: Clarene Essex, MD;  Location: [facility] ENDOS[facility];  Service: Endoscopy;  Laterality: N/A;   ESOPHAGOGASTRODUODENOSCOPY (EGD) WITH PROPOFOL N/A 07/16/2020   Procedure: ESOPHAGOGASTRODUODENOSCOPY (EGD) WITH PROPOFOL;  Surgeon: Clarene Essex, MD;  Location: [facility];  Service: Endoscopy;  Laterality: N/A;   ESOPHAGOGASTRODUODENOSCOPY (EGD) WITH PROPOFOL N/A 02/19/2021   Procedure: ESOPHAGOGASTRODUODENOSCOPY (EGD) WITH PROPOFOL;  Surgeon: Clarene Essex, MD;  Location: [facility] END[facility];  Service: Endoscopy;  Laterality: N/A;  with possible botox   FOREIGN BODY REMOVAL  07/19/2018   Procedure: FOREIGN BODY REMOVAL;  Surgeon: Clarene Essex, MD;  Location: [facility] ENDOS[facility];  Service: Endoscopy;;   FRACTURE SURGERY     Hx: of left wrist 01/2021   GASTRECTOMY  [id number]   GASTRECTOMY N/A    X3   LITHOTRIPSY     Hx; of for kidney stones 23007   SAVORY DILATION N/A 08/10/2014   Procedure: SAVORY DILATION;  Surgeon: Altamese Dilling  Drema Balzarine, MD;  Location: [facility] ENDOSCOPY;  Service: Endoscopy;  Laterality: N/A;   TOTAL HIP ARTHROPLASTY Right 12/30/2017   Procedure: RIGHT TOTAL HIP ARTHROPLASTY;  Surgeon: Carole Civil, MD;  Location: [facility];  Service: Orthopedics;  Laterality: Right;   TOTAL HIP ARTHROPLASTY Left 02/17/2018   Procedure: TOTAL HIP ARTHROPLASTY;  Surgeon: Carole Civil, MD;  Location: [facility];  Service: Orthopedics;  Laterality: Left;   TUBAL LIGATION     1975    Family History  Problem Relation Age of Onset   Diabetes Mother    Cancer - Prostate Brother     New complaints: None today  Social history: Lives by herself but her son and brother live beside her  Controlled substance contract: 05/22/21     Review of Systems  Constitutional:  Negative for diaphoresis.  Eyes:  Negative for pain.  Respiratory:  Negative for shortness of breath.   Cardiovascular:  Negative for chest pain, palpitations and leg swelling.  Gastrointestinal:   Negative for abdominal pain.  Endocrine: Negative for polydipsia.  Skin:  Negative for rash.  Neurological:  Negative for dizziness, weakness and headaches.  Hematological:  Does not bruise/bleed easily.  All other systems reviewed and are negative.     Objective:   Physical Exam Vitals and nursing note reviewed.  Constitutional:      General: She is not in acute distress.    Appearance: Normal appearance. She is well-developed.  HENT:     Head: Normocephalic.     Right Ear: Tympanic membrane normal.     Left Ear: Tympanic membrane normal.     Nose: Nose normal.     Mouth/Throat:     Mouth: Mucous membranes are moist.  Eyes:     Pupils: Pupils are equal, round, and reactive to light.  Neck:     Vascular: No carotid bruit or JVD.  Cardiovascular:     Rate and Rhythm: Normal rate and regular rhythm.     Heart sounds: Normal heart sounds.  Pulmonary:     Effort: Pulmonary effort is normal. No respiratory distress.     Breath sounds: Normal breath sounds. No wheezing or rales.  Chest:     Chest wall: No tenderness.  Abdominal:     General: Bowel sounds are normal. There is no distension or abdominal bruit.     Palpations: Abdomen is soft. There is no hepatomegaly, splenomegaly, mass or pulsatile mass.     Tenderness: There is no abdominal tenderness.  Musculoskeletal:        General: Normal range of motion.     Cervical back: Normal range of motion and neck supple.  Lymphadenopathy:     Cervical: No cervical adenopathy.  Skin:    General: Skin is warm and dry.  Neurological:     Mental Status: She is alert and oriented to person, place, and time.     Deep Tendon Reflexes: Reflexes are normal and symmetric.  Psychiatric:        Behavior: Behavior normal.        Thought Content: Thought content normal.        Judgment: Judgment normal.    BP 122/73   Pulse 82   Temp 98.4 F (36.9 C)   Ht _0  (1.651 m)   Wt 104 lb 3.2 oz (47.3 kg)   SpO2 96%   BMI 17.34 kg/m         Assessment & Plan:  Brittany Hensley comes in today with  chief complaint of Medical Management of Chronic Issues   Diagnosis and orders addressed:  1. Gastroesophageal reflux disease without esophagitis Avoid spicy foods Do not eat 2 hours prior to bedtime - omeprazole (PRILOSEC) 40 MG capsule; Take 1 capsule (40 mg total) by mouth daily.  Dispense: 90 capsule; Refill: 1  2. Gastric outlet obstruction Continue    to eat frequent small meals  3. Hypothyroidism due to acquired atrophy of thyroid Labs pending  4. GAD (generalized anxiety disorder) Stress management - sertraline (ZOLOFT) 100 MG tablet; Take 2 tablets (200 mg total) by mouth daily.  Dispense: 180 tablet; Refill: 1 - ALPRAZolam (XANAX) 0.5 MG tablet; Take 1 tablet (0.5 mg total) by mouth 4 (four) times daily as needed for anxiety.  Dispense: 120 tablet; Refill: 5  5. BMI less than 19,adult Discussed diet and exercise for person with BMI >25 Will recheck weight in 3-6 months  6. Iron deficiency anemia due to chronic blood loss - sucralfate (CARAFATE) 1 g tablet; TAKE ONE TABLET THREE TIMES A DAY WITH MEALS  Dispense: 90 tablet; Refill: 1 - folic acid (FOLVITE) 366 MCG tablet; Take 1 tablet (800 mcg total) by mouth daily.  Dispense: 90 tablet; Refill: 1  Orders Placed This Encounter  Procedures   CBC with Differential/Platelet   CMP14+EGFR   Lipid panel   Thyroid Panel With TSH    Labs pending Health Maintenance reviewed Diet and exercise encouraged  Follow up plan: 6 months   [place], FNP

## 2021-05-23 LAB — CMP14+EGFR
ALT: 14 IU/L (ref 0–32)
AST: 18 IU/L (ref 0–40)
Albumin/Globulin Ratio: 2.3 — ABNORMAL HIGH (ref 1.2–2.2)
Albumin: 3.9 g/dL (ref 3.8–4.8)
Alkaline Phosphatase: 69 IU/L (ref 44–121)
BUN/Creatinine Ratio: 20 (ref 12–28)
BUN: 19 mg/dL (ref 8–27)
Bilirubin Total: <0.2 mg/dL (ref 0.0–1.2)
CO2: 18 mmol/L — ABNORMAL LOW (ref 20–29)
Calcium: 8.7 mg/dL (ref 8.7–10.3)
Chloride: 109 mmol/L — ABNORMAL HIGH (ref 96–106)
Creatinine, Ser: 0.97 mg/dL (ref 0.57–1.00)
Globulin, Total: 1.7 g/dL (ref 1.5–4.5)
Glucose: 92 mg/dL (ref 70–99)
Potassium: 3.8 mmol/L (ref 3.5–5.2)
Sodium: 142 mmol/L (ref 134–144)
Total Protein: 5.6 g/dL — ABNORMAL LOW (ref 6.0–8.5)
eGFR: 63 mL/min/{1.73_m2} (ref >59)

## 2021-05-23 LAB — CBC WITH DIFFERENTIAL/PLATELET
Basophils Absolute: 0 10*3/uL (ref 0.0–0.2)
Basos: 0 %
EOS (ABSOLUTE): 0.1 10*3/uL (ref 0.0–0.4)
Eos: 2 %
Hematocrit: 35.2 % (ref 34.0–46.6)
Hemoglobin: 11.8 g/dL (ref 11.1–15.9)
Immature Grans (Abs): 0 10*3/uL (ref 0.0–0.1)
Immature Granulocytes: 0 %
Lymphocytes Absolute: 1.4 10*3/uL (ref 0.7–3.1)
Lymphs: 29 %
MCH: 32 pg (ref 26.6–33.0)
MCHC: 33.5 g/dL (ref 31.5–35.7)
MCV: 95 fL (ref 79–97)
Monocytes Absolute: 0.6 10*3/uL (ref 0.1–0.9)
Monocytes: 12 %
Neutrophils Absolute: 2.7 10*3/uL (ref 1.4–7.0)
Neutrophils: 57 %
Platelets: 266 10*3/uL (ref 150–450)
RBC: 3.69 x10E6/uL — ABNORMAL LOW (ref 3.77–5.28)
RDW: 11.4 % — ABNORMAL LOW (ref 11.7–15.4)
WBC: 4.8 10*3/uL (ref 3.4–10.8)

## 2021-05-23 LAB — THYROID PANEL WITH TSH
Free Thyroxine Index: 1.7 (ref 1.2–4.9)
T3 Uptake Ratio: 32 % (ref 24–39)
T4, Total: 5.4 ug/dL (ref 4.5–12.0)
TSH: 0.123 u[IU]/mL — ABNORMAL LOW (ref 0.450–4.500)

## 2021-05-23 LAB — LIPID PANEL
Chol/HDL Ratio: 3 ratio (ref 0.0–4.4)
Cholesterol, Total: 153 mg/dL (ref 100–199)
HDL: 51 mg/dL (ref >39)
LDL Chol Calc (NIH): 74 mg/dL (ref 0–99)
Triglycerides: 165 mg/dL — ABNORMAL HIGH (ref 0–149)
VLDL Cholesterol Cal: 28 mg/dL (ref 5–40)

## 2021-05-27 ENCOUNTER — Other Ambulatory Visit: Payer: Self-pay | Admitting: Nurse Practitioner

## 2021-05-27 ENCOUNTER — Other Ambulatory Visit: Payer: Self-pay | Admitting: Orthopedic Surgery

## 2021-05-28 ENCOUNTER — Ambulatory Visit (INDEPENDENT_AMBULATORY_CARE_PROVIDER_SITE_OTHER): Payer: PPO | Admitting: Family Medicine

## 2021-05-28 ENCOUNTER — Encounter: Payer: Self-pay | Admitting: Family Medicine

## 2021-05-28 DIAGNOSIS — U071 COVID-19: Secondary | ICD-10-CM | POA: Diagnosis not present

## 2021-05-28 DIAGNOSIS — R011 Cardiac murmur, unspecified: Secondary | ICD-10-CM

## 2021-05-28 MED ORDER — GUAIFENESIN ER 600 MG PO TB12: 1200.0000 mg | ORAL_TABLET | Freq: Two times a day (BID) | ORAL | 0 refills | Status: DC | PRN

## 2021-05-28 MED ORDER — PREDNISONE 10 MG (21) PO TBPK: ORAL_TABLET | ORAL | 0 refills | Status: DC

## 2021-05-28 MED ORDER — AZITHROMYCIN 250 MG PO TABS: 500.0000 mg | ORAL_TABLET | Freq: Once | ORAL | 5 refills | Status: AC

## 2021-05-28 NOTE — Progress Notes (Signed)
   Virtual Visit  Note Due to COVID-19 pandemic this visit was conducted virtually. This visit type was conducted due to national recommendations for restrictions regarding the COVID-19 Pandemic (e.g. social distancing, sheltering in place) in an effort to limit this patient's exposure and mitigate transmission in our community. All issues noted in this document were discussed and addressed.  A physical exam was not performed with this format.  I connected with Brittany Hensley on 05/28/21 at 1335 by telephone and verified that I am speaking with the correct person using two identifiers. Brittany Hensley is currently located at home and no one is currently with her during the visit. The provider, Gwenlyn Perking, FNP is located in their office at time of visit.  I discussed the limitations, risks, security and privacy concerns of performing an evaluation and management service by telephone and the availability of in person appointments. I also discussed with the patient that there may be a patient responsible charge related to this service. The patient expressed understanding and agreed to proceed.  CC: Covid positive  History and Present Illness:  HPI Brittany Hensley reports testing positive for Covid yesterday. Her symptoms started yesterday. She reports headache, congestion, cough, chills, and nausea. She denies fever, myalgias, shortness of breath, chest pain, vomiting, or diarrhea. She has taken claritin for her symptoms. She is not interested in antiviral therapy.   She also need a refill of azithromycin. She takes this prior to dental procedures and has a procedure scheduled in about 10 days.     ROS As per HPI.   Observations/Objective: Alert and oriented x 3. Able to speak in full sentences without difficulty.   Assessment and Plan: Brittany Hensley was seen today for covid positive.  Diagnoses and all orders for this visit:  COVID-19 Declined antiviral treatment. Discussed quarantine. Discussed  symptomatic care and return precautions.  -     predniSONE (STERAPRED UNI-PAK 21 TAB) 10 MG (21) TBPK tablet; Use as directed on back of pill pack -     guaiFENesin (MUCINEX) 600 MG 12 hr tablet; Take 2 tablets (1,200 mg total) by mouth 2 (two) times daily as needed for cough or to loosen phlegm.  MURMUR Refill sent in.  -     azithromycin (ZITHROMAX) 250 MG tablet; Take 2 tablets (500 mg total) by mouth once for 1 dose. Take 30-60 minutes before dental procedure    Follow Up Instructions: Return to office for new or worsening symptoms, or if symptoms persist.     I discussed the assessment and treatment plan with the patient. The patient was provided an opportunity to ask questions and all were answered. The patient agreed with the plan and demonstrated an understanding of the instructions.   The patient was advised to call back or seek an in-person evaluation if the symptoms worsen or if the condition fails to improve as anticipated.  The above assessment and management plan was discussed with the patient. The patient verbalized understanding of and has agreed to the management plan. Patient is aware to call the clinic if symptoms persist or worsen. Patient is aware when to return to the clinic for a follow-up visit. Patient educated on when it is appropriate to go to the emergency department.   Time call ended:  1347  I provided 12 minutes of  non face-to-face time during this encounter.    Gwenlyn Perking, FNP

## 2021-05-30 ENCOUNTER — Inpatient Hospital Stay: Payer: PPO

## 2021-05-30 ENCOUNTER — Inpatient Hospital Stay: Payer: PPO | Admitting: Oncology

## 2021-05-30 ENCOUNTER — Telehealth: Payer: Self-pay | Admitting: Nurse Practitioner

## 2021-05-30 ENCOUNTER — Other Ambulatory Visit: Payer: PPO

## 2021-05-30 ENCOUNTER — Ambulatory Visit: Payer: PPO | Admitting: Oncology

## 2021-06-03 ENCOUNTER — Other Ambulatory Visit: Payer: Self-pay

## 2021-06-03 DIAGNOSIS — D500 Iron deficiency anemia secondary to blood loss (chronic): Secondary | ICD-10-CM

## 2021-06-03 NOTE — Telephone Encounter (Signed)
Mailed labs to pt

## 2021-06-05 ENCOUNTER — Encounter: Payer: PPO | Admitting: Urology

## 2021-06-09 ENCOUNTER — Telehealth: Payer: Self-pay | Admitting: Nurse Practitioner

## 2021-06-09 ENCOUNTER — Other Ambulatory Visit: Payer: Self-pay | Admitting: Nurse Practitioner

## 2021-06-09 MED ORDER — NYSTATIN 100000 UNIT/ML MT SUSP: 5.0000 mL | Freq: Four times a day (QID) | OROMUCOSAL | 0 refills | Status: DC

## 2021-06-09 NOTE — Telephone Encounter (Signed)
Left message for patient to call back and schedule Medicare Annual Wellness Visit (AWV) either virtually or in office.   *due 08/03/2009 awvi per [employer]  please schedule at anytime with health coach  This should be a 45 minute visit.

## 2021-06-09 NOTE — Telephone Encounter (Signed)
Patient notified and verbalized understanding. 

## 2021-06-09 NOTE — Telephone Encounter (Signed)
Correction No answer  could not leave message

## 2021-06-09 NOTE — Progress Notes (Signed)
[facility] magic mouth wash

## 2021-06-09 NOTE — Telephone Encounter (Signed)
Magic mouthwash will not go electronically, will print and have to fax

## 2021-06-10 ENCOUNTER — Inpatient Hospital Stay: Payer: PPO

## 2021-06-10 ENCOUNTER — Inpatient Hospital Stay: Payer: PPO | Admitting: Oncology

## 2021-06-12 ENCOUNTER — Ambulatory Visit (INDEPENDENT_AMBULATORY_CARE_PROVIDER_SITE_OTHER): Payer: PPO | Admitting: Urology

## 2021-06-12 ENCOUNTER — Encounter: Payer: Self-pay | Admitting: Urology

## 2021-06-12 ENCOUNTER — Other Ambulatory Visit: Payer: Self-pay

## 2021-06-12 VITALS — BP 128/64 | HR 90 | Temp 98.7°F

## 2021-06-12 DIAGNOSIS — N390 Urinary tract infection, site not specified: Secondary | ICD-10-CM

## 2021-06-12 DIAGNOSIS — N200 Calculus of kidney: Secondary | ICD-10-CM | POA: Diagnosis not present

## 2021-06-12 LAB — MICROSCOPIC EXAMINATION
Epithelial Cells (non renal): >10 /hpf — AB (ref 0–10)
RBC, Urine: >30 /hpf — AB (ref 0–2)
Renal Epithel, UA: NONE SEEN /hpf

## 2021-06-12 LAB — URINALYSIS, ROUTINE W REFLEX MICROSCOPIC
Bilirubin, UA: NEGATIVE
Glucose, UA: NEGATIVE
Nitrite, UA: NEGATIVE
Specific Gravity, UA: 1.02 (ref 1.005–1.030)
Urobilinogen, Ur: 0.2 mg/dL (ref 0.2–1.0)
pH, UA: 6 (ref 5.0–7.5)

## 2021-06-12 NOTE — Progress Notes (Signed)
Urological Symptom Review  Patient is experiencing the following symptoms: Get up at night to urinate   Review of Systems  Gastrointestinal (upper)  : Indigestion/heartburn  Gastrointestinal (lower) : Negative for lower GI symptoms  Constitutional : Negative for symptoms  Skin: Negative for skin symptoms  Eyes: Negative for eye symptoms  Ear/Nose/Throat : Negative for Ear/Nose/Throat symptoms  Hematologic/Lymphatic: Negative for Hematologic/Lymphatic symptoms  Cardiovascular : Negative for cardiovascular symptoms  Respiratory : Negative for respiratory symptoms  Endocrine: Negative for endocrine symptoms  Musculoskeletal: Back pain  Neurological: Negative for neurological symptoms  Psychologic: Anxiety

## 2021-06-12 NOTE — Progress Notes (Signed)
Subjective: 1. Kidney stones   2. Urinary tract infection without hematuria, site unspecified      Consult requested by Brittany Jefferson PA  Teria is a former patient with a history of stones who I last saw in 2009.  She was in the ER on 04/14/21 with gross hematuria and a CT shows bilateral non-obstructing renal stones with a malrotated right kidney.   There is a partial staghorn stone in the LLP that has multiple small stones making it up.  The stones are larger on the right and distributed throughout the kidney.  The bladder is not well seen because of beam hardening from bilateral hip implants.  She has no other voiding complaints.  Her UA today has >30 RBC with WBC and bacteria as well.   06/12/21: Lavere returns today in f/u from cystoscopy with bil RTG, left URS with HLL and stent on 05/06/21.   She had multiple LLP stones that were felt to be the most likely cause of the gross hematuria.  She remains on doxycycline for suppression of an enterococcal UTI.  Her UA today shows 6-10 WBC, >30 RBC's and mod bact.   ROS:  ROS  Allergies  Allergen Reactions   Augmentin [Amoxicillin-Pot Clavulanate] Other (See Comments)    Bloody stool Did it involve swelling of the face/tongue/throat, SOB, or low BP? No Did it involve sudden or severe rash/hives, skin peeling, or any reaction on the inside of your mouth or nose? No Did you need to seek medical attention at a hospital or doctor's office? No When did it last happen?      1 year If all above answers are "NO", may proceed with cephalosporin use.    Famotidine Other (See Comments)    Fever    Klonopin [Clonazepam] Other (See Comments)    double vision   Nitrofurantoin Rash   Reglan [Metoclopramide] Anxiety and Other (See Comments)    Causes confusion   Sulfamethoxazole-Trimethoprim Rash    Past Medical History:  Diagnosis Date   Anemia    iron    Anxiety    Arthritis    COVID    2020 bad cold s/s lasted 1 week. 04/28/2021    Depression    Diverticulitis    Hx: of   GERD (gastroesophageal reflux disease)    at times for 15 yrs 04/28/2021   History of kidney stones    10 years ago   Hypothyroidism    Low iron    Pneumonia    2020   Rosacea    Hx: of    Past Surgical History:  Procedure Laterality Date   APPENDECTOMY     BALLOON DILATION  06/16/2011   Procedure: BALLOON DILATION;  Surgeon: Jeryl Columbia, MD;  Location: [facility] ENDOSCOPY;  Service: Endoscopy;  Laterality: N/A;   BALLOON DILATION N/A 10/03/2013   Procedure: BALLOON DILATION;  Surgeon: Jeryl Columbia, MD;  Location: [facility] ENDOSCOPY;  Service: Endoscopy;  Laterality: N/A;   BALLOON DILATION N/A 08/10/2014   Procedure: BALLOON DILATION;  Surgeon: Jeryl Columbia, MD;  Location: [facility] ENDOSCOPY;  Service: Endoscopy;  Laterality: N/A;  from 12 - 15 cm dilation completed   BALLOON DILATION N/A 02/15/2015   Procedure: BALLOON DILATION;  Surgeon: Clarene Essex, MD;  Location: [facility] ENDOSCOPY;  Service: Endoscopy;  Laterality: N/A;   BALLOON DILATION N/A 11/12/2015   Procedure: BALLOON DILATION;  Surgeon: Clarene Essex, MD;  Location: [facility] ENDOSCOPY;  Service: Endoscopy;  Laterality: N/A;   BALLOON DILATION N/A  08/10/2016   Procedure: BALLOON DILATION;  Surgeon: Clarene Essex, MD;  Location: [facility] ENDOSCOPY;  Service: Endoscopy;  Laterality: N/A;   BALLOON DILATION N/A 08/07/2016   Procedure: BALLOON DILATION;  Surgeon: Clarene Essex, MD;  Location: [facility] ENDOSCOPY;  Service: Endoscopy;  Laterality: N/A;   BALLOON DILATION N/A 05/20/2017   Procedure: BALLOON DILATION;  Surgeon: Clarene Essex, MD;  Location: [facility] ENDOSCOPY;  Service: Endoscopy;  Laterality: N/A;   BALLOON DILATION N/A 12/02/2017   Procedure: BALLOON DILATION;  Surgeon: Clarene Essex, MD;  Location: [place];  Service: Endoscopy;  Laterality: N/A;   BALLOON DILATION N/A 07/19/2018   Procedure: BALLOON DILATION;  Surgeon: Clarene Essex, MD;  Location: [facility] ENDOSCOPY;  Service: Endoscopy;  Laterality: N/A;   BALLOON DILATION N/A  01/23/2019   Procedure: BALLOON DILATION;  Surgeon: Clarene Essex, MD;  Location: [facility] ENDOSCOPY;  Service: Endoscopy;  Laterality: N/A;   BALLOON DILATION N/A 07/13/2019   Procedure: BALLOON DILATION;  Surgeon: Clarene Essex, MD;  Location: [facility] ENDOSCOPY;  Service: Endoscopy;  Laterality: N/A;   BALLOON DILATION N/A 02/02/2020   Procedure: BALLOON DILATION;  Surgeon: Clarene Essex, MD;  Location: [facility] ENDOSCOPY;  Service: Endoscopy;  Laterality: N/A;   BALLOON DILATION N/A 07/16/2020   Procedure: BALLOON DILATION;  Surgeon: Clarene Essex, MD;  Location: [facility] ENDOSCOPY;  Service: Endoscopy;  Laterality: N/A;   BALLOON DILATION N/A 02/19/2021   Procedure: BALLOON DILATION;  Surgeon: Clarene Essex, MD;  Location: [facility] ENDOSCOPY;  Service: Endoscopy;  Laterality: N/A;   CATARACT EXTRACTION, BILATERAL     2019   CHOLECYSTECTOMY OPEN  1979   COLONOSCOPY     Hx: of   CYSTOSCOPY/URETEROSCOPY/HOLMIUM LASER/STENT PLACEMENT Bilateral 05/06/2021   Procedure: CYSTOSCOPY BILATERAL RETROGRADE LEFT URETEROSCOPY/HOLMIUM LASER/STENT PLACEMENT;  Surgeon: [name], MD;  Location: [facility];  Service: Urology;  Laterality: Bilateral;   ESOPHAGOGASTRODUODENOSCOPY  06/16/2011   Procedure: ESOPHAGOGASTRODUODENOSCOPY (EGD);  Surgeon: Jeryl Columbia, MD;  Location: [facility] ENDOSCOPY;  Service: Endoscopy;  Laterality: N/A;   ESOPHAGOGASTRODUODENOSCOPY N/A 10/03/2013   Procedure: ESOPHAGOGASTRODUODENOSCOPY (EGD);  Surgeon: Jeryl Columbia, MD;  Location: [facility] ENDOSCOPY;  Service: Endoscopy;  Laterality: N/A;   ESOPHAGOGASTRODUODENOSCOPY N/A 08/10/2014   Procedure: ESOPHAGOGASTRODUODENOSCOPY (EGD);  Surgeon: Jeryl Columbia, MD;  Location: [facility] ENDOSCOPY;  Service: Endoscopy;  Laterality: N/A;   ESOPHAGOGASTRODUODENOSCOPY N/A 08/10/2016   Procedure: ESOPHAGOGASTRODUODENOSCOPY (EGD);  Surgeon: Clarene Essex, MD;  Location: [facility] ENDOSCOPY;  Service: Endoscopy;  Laterality: N/A;   ESOPHAGOGASTRODUODENOSCOPY N/A 07/19/2018   Procedure:  ESOPHAGOGASTRODUODENOSCOPY (EGD);  Surgeon: Clarene Essex, MD;  Location: [facility] ENDOSCOPY;  Service: Endoscopy;  Laterality: N/A;   ESOPHAGOGASTRODUODENOSCOPY (EGD) WITH PROPOFOL N/A 02/15/2015   Procedure: ESOPHAGOGASTRODUODENOSCOPY (EGD) WITH PROPOFOL;  Surgeon: Clarene Essex, MD;  Location: [facility] ENDOSCOPY;  Service: Endoscopy;  Laterality: N/A;   ESOPHAGOGASTRODUODENOSCOPY (EGD) WITH PROPOFOL N/A 11/12/2015   Procedure: ESOPHAGOGASTRODUODENOSCOPY (EGD) WITH PROPOFOL;  Surgeon: Clarene Essex, MD;  Location: [facility] ENDOSCOPY;  Service: Endoscopy;  Laterality: N/A;   ESOPHAGOGASTRODUODENOSCOPY (EGD) WITH PROPOFOL N/A 08/07/2016   Procedure: ESOPHAGOGASTRODUODENOSCOPY (EGD) WITH PROPOFOL;  Surgeon: Clarene Essex, MD;  Location: [facility] ENDOSCOPY;  Service: Endoscopy;  Laterality: N/A;   ESOPHAGOGASTRODUODENOSCOPY (EGD) WITH PROPOFOL N/A 05/20/2017   Procedure: ESOPHAGOGASTRODUODENOSCOPY (EGD) WITH PROPOFOL;  Surgeon: Clarene Essex, MD;  Location: [facility] ENDOSCOPY;  Service: Endoscopy;  Laterality: N/A;   ESOPHAGOGASTRODUODENOSCOPY (EGD) WITH PROPOFOL N/A 12/02/2017   Procedure: ESOPHAGOGASTRODUODENOSCOPY (EGD) WITH PROPOFOL;  Surgeon: Clarene Essex, MD;  Location: [facility];  Service: Endoscopy;  Laterality: N/A;  have c arm available  ESOPHAGOGASTRODUODENOSCOPY (EGD) WITH PROPOFOL N/A 01/23/2019   Procedure: ESOPHAGOGASTRODUODENOSCOPY (EGD) WITH PROPOFOL;  Surgeon: Clarene Essex, MD;  Location: [facility] ENDOSCOPY;  Service: Endoscopy;  Laterality: N/A;   ESOPHAGOGASTRODUODENOSCOPY (EGD) WITH PROPOFOL N/A 07/13/2019   Procedure: ESOPHAGOGASTRODUODENOSCOPY (EGD) WITH PROPOFOL;  Surgeon: Clarene Essex, MD;  Location: [facility]OS[facility];  Service: Endoscopy;  Laterality: N/A;   ESOPHAGOGASTRODUODENOSCOPY (EGD) WITH PROPOFOL N/A 02/02/2020   Procedure: ESOPHAGOGASTRODUODENOSCOPY (EGD) WITH PROPOFOL;  Surgeon: Clarene Essex, MD;  Location: [facility] ENDOS[facility];  Service: Endoscopy;  Laterality: N/A;   ESOPHAGOGASTRODUODENOSCOPY (EGD) WITH PROPOFOL N/A  07/16/2020   Procedure: ESOPHAGOGASTRODUODENOSCOPY (EGD) WITH PROPOFOL;  Surgeon: Clarene Essex, MD;  Location: [facility] ENDOS[facility];  Service: Endoscopy;  Laterality: N/A;   ESOPHAGOGASTRODUODENOSCOPY (EGD) WITH PROPOFOL N/A 02/19/2021   Procedure: ESOPHAGOGASTRODUODENOSCOPY (EGD) WITH PROPOFOL;  Surgeon: Clarene Essex, MD;  Location: [facility] ENDOSCOPY;  Service: Endoscopy;  Laterality: N/A;  with possible botox   FOREIGN BODY REMOVAL  07/19/2018   Procedure: FOREIGN BODY REMOVAL;  Surgeon: Clarene Essex, MD;  Location: [facility] ENDOSCOPY;  Service: Endoscopy;;   FRACTURE SURGERY     Hx: of left wrist 01/2021   GASTRECTOMY  [phone]   GASTRECTOMY N/A    X3   LITHOTRIPSY     Hx; of for kidney stones [id number]   SAVORY DILATION N/A 08/10/2014   Procedure: SAVORY DILATION;  Surgeon: Jeryl Columbia, MD;  Location: [facility] ENDOSCOPY;  Service: Endoscopy;  Laterality: N/A;   TOTAL HIP ARTHROPLASTY Right 12/30/2017   Procedure: RIGHT TOTAL HIP ARTHROPLASTY;  Surgeon: Carole Civil, MD;  Location: [facility];  Service: Orthopedics;  Laterality: Right;   TOTAL HIP ARTHROPLASTY Left 02/17/2018   Procedure: TOTAL HIP ARTHROPLASTY;  Surgeon: Carole Civil, MD;  Location: [facility];  Service: Orthopedics;  Laterality: Left;   TUBAL LIGATION     1975    Social History   Socioeconomic History   Marital status: Widowed    Spouse name: Not on file   Number of children: Not on file   Years of education: Not on file   Highest education level: Not on file  Occupational History   Not on file  Tobacco Use   Smoking status: Never   Smokeless tobacco: Never  Vaping Use   Vaping Use: Never used  Substance and Sexual Activity   Alcohol use: No   Drug use: No   Sexual activity: Not Currently  Other Topics Concern   Not on file  Social History Narrative   Not on file   Social Determinants of Health   Financial Resource Strain: Not on file  Food Insecurity: Not on file  Transportation Needs: Not on file  Physical  Activity: Not on file  Stress: Not on file  Social Connections: Not on file  Intimate Partner Violence: Not on file    Family History  Problem Relation Age of Onset   Diabetes Mother    Cancer - Prostate Brother     Anti-infectives: Anti-infectives (From admission, onward)    None       Current Outpatient Medications  Medication Sig Dispense Refill   acetaminophen (TYLENOL) 500 MG tablet Take 500-1,000 mg by mouth See admin instructions. Take 500 mg int he morning and 1000 mg at  bedtime     albuterol (VENTOLIN HFA) 108 (90 Base) MCG/ACT inhaler USE 2 PUFFS EVERY 6 HOURS AS NEEDED 8.5 g 2   ALPRAZolam (XANAX) 0.5 MG tablet Take 1 tablet (0.5 mg total) by mouth 4 (four) times daily as needed for anxiety.  120 tablet 5   Ascorbic Acid (VITAMIN C) 1000 MG tablet Take 1,000 mg by mouth daily.     b complex vitamins capsule Take 1 capsule by mouth as needed.     Biotin 10 MG CAPS Take 10 mg by mouth daily.     BLACK ELDERBERRY PO Take 50 mg by mouth daily.     Calcium Carbonate (CALCIUM 600 PO) Take 600 mg by mouth 2 (two) times daily.      Cholecalciferol (VITAMIN D) 2000 units tablet Take 2,000 Units by mouth 2 (two) times daily.     diclofenac Sodium (VOLTAREN) 1 % GEL APPLY 4 GRAMS 4 TIMES DAILY 400 g 1   docusate sodium (COLACE) 100 MG capsule Take 1 capsule (100 mg total) by mouth 2 (two) times daily. (Patient taking differently: Take 100 mg by mouth daily.) 10 capsule 0   erythromycin base (E-MYCIN) 500 MG tablet 1 po 1 hour prior to dental procedure and 1 po after procedure 10 tablet 1   Flaxseed, Linseed, (FLAXSEED OIL) 1200 MG CAPS Take 1,200 mg by mouth daily.     folic acid (FOLVITE) 856 MCG tablet Take 1 tablet (800 mcg total) by mouth daily. 90 tablet 1   guaiFENesin (MUCINEX) 600 MG 12 hr tablet Take 2 tablets (1,200 mg total) by mouth 2 (two) times daily as needed for cough or to loosen phlegm. 30 tablet 0   levothyroxine (SYNTHROID) 112 MCG tablet Take 1 tablet (112  mcg total) by mouth daily. (Patient taking differently: Take 112 mcg by mouth daily before breakfast.) 90 tablet 3   loratadine (CLARITIN) 10 MG tablet TAKE ONE (1) TABLET EACH DAY 90 tablet 1   magic mouthwash (nystatin, lidocaine, diphenhydrAMINE) suspension Take 5 mLs by mouth 4 (four) times daily. 180 mL 0   magnesium gluconate (MAGONATE) 500 MG tablet Take 500 mg by mouth 2 (two) times daily.     Multiple Vitamins-Minerals (CENTRUM SILVER) CHEW Chew 1 tablet by mouth 2 (two) times daily.      omeprazole (PRILOSEC) 40 MG capsule Take 1 capsule (40 mg total) by mouth daily. 90 capsule 1   ondansetron (ZOFRAN-ODT) 4 MG disintegrating tablet TAKE 1 TABLET EVERY 8 HOURS AS NEEDED FOR NAUSEA AND VOMITING (Patient taking differently: 4 mg as needed.) 30 tablet 0   Polyethyl Glycol-Propyl Glycol (SYSTANE OP) Place 1 drop into both eyes daily as needed (dry eyes).     predniSONE (STERAPRED UNI-PAK 21 TAB) 10 MG (21) TBPK tablet Use as directed on back of pill pack 21 tablet 0   Probiotic Product (PROBIOTIC PO) Take 1 capsule by mouth daily.      sertraline (ZOLOFT) 100 MG tablet Take 2 tablets (200 mg total) by mouth daily. 180 tablet 1   sucralfate (CARAFATE) 1 g tablet TAKE ONE TABLET THREE TIMES A DAY WITH MEALS 90 tablet 1   No current facility-administered medications for this visit.     Objective: Vital signs in last 24 hours: BP 128/64   Pulse 90   Temp 98.7 F (37.1 C)   Intake/Output from previous day: No intake/output data recorded. Intake/Output this shift: @IOTHISSHIFT @   Physical Exam  Lab Results:  Results for orders placed or performed in visit on 06/12/21 (from the past 24 hour(s))  Urinalysis, Routine w reflex microscopic     Status: Abnormal   Collection Time: 06/12/21  3:04 PM  Result Value Ref Range   Specific Gravity, UA 1.020 1.005 - 1.030   pH, UA  6.0 5.0 - 7.5   Color, UA Yellow Yellow   Appearance Ur Clear Clear   Leukocytes,UA Trace (A) Negative    Protein,UA 1+ (A) Negative/Trace   Glucose, UA Negative Negative   Ketones, UA Trace (A) Negative   RBC, UA 2+ (A) Negative   Bilirubin, UA Negative Negative   Urobilinogen, Ur 0.2 0.2 - 1.0 mg/dL   Nitrite, UA Negative Negative   Microscopic Examination See below:    Narrative   Performed at:  [facility] [address] [place], [place]  [id number] Lab Director: Mina Marble MT, Phone:  [phone]  Microscopic Examination     Status: Abnormal   Collection Time: 06/12/21  3:04 PM   Urine  Result Value Ref Range   WBC, UA 6-10 (A) 0 - 5 /hpf   RBC >30 (A) 0 - 2 /hpf   Epithelial Cells (non renal) >10 (A) 0 - 10 /hpf   Renal Epithel, UA None seen None seen /hpf   Mucus, UA Present Not Estab.   Bacteria, UA Moderate (A) None seen/Few   Narrative   Performed at:  [facility] [address], [place], [place]  [id number] Lab Director: [place], Phone:  [phone]    BMET No results for input(s): NA, K, CL, CO2, GLUCOSE, BUN, CREATININE, CALCIUM in the last 72 hours. PT/INR No results for input(s): LABPROT, INR in the last 72 hours. ABG No results for input(s): PHART, HCO3 in the last 72 hours.  Invalid input(s): PCO2, PO2  Studies/Results: No results found.   Assessment/Plan: Gross hematuria with bilateral renal stones and right renal malrotation.  The Gross hematuria has resolved following left ureteroscopy stone extraction with laser lithotripsy.  I will get a KUB and renal US to assess results.   UTI.   I will culture the urine and adjust the meds accordingly.    I will order a urine culture.  No orders of the defined types were placed in this encounter.    Orders Placed This Encounter  Procedures   Urine Culture   Microscopic Examination   DG Abd 1 View    Standing Status:   Future    Standing Expiration Date:   06/12/2022    Order Specific Question:   Reason for Exam (SYMPTOM  OR DIAGNOSIS REQUIRED)    Answer:   renal  stones    Order Specific Question:   Preferred imaging location?    Answer:   [facility]    Order Specific Question:   Radiology Contrast Protocol - do NOT remove file path    Answer:   [email or web address]   US RENAL    Standing Status:   Future    Standing Expiration Date:   06/12/2022    Order Specific Question:   Reason for Exam (SYMPTOM  OR DIAGNOSIS REQUIRED)    Answer:   renal stones    Order Specific Question:   Preferred imaging location?    Answer:   [facility]   Calculi, with Photograph (to Clinical Lab)   Urinalysis, Routine w reflex microscopic     Return in about 3 months (around 09/12/2021).    CC: Chevis Pretty FNP.      [name] 06/12/2021 [phone]

## 2021-06-14 LAB — URINE CULTURE

## 2021-06-16 ENCOUNTER — Inpatient Hospital Stay: Payer: PPO | Attending: Oncology

## 2021-06-16 ENCOUNTER — Encounter: Payer: Self-pay | Admitting: Nurse Practitioner

## 2021-06-16 ENCOUNTER — Other Ambulatory Visit: Payer: Self-pay

## 2021-06-16 ENCOUNTER — Inpatient Hospital Stay (HOSPITAL_BASED_OUTPATIENT_CLINIC_OR_DEPARTMENT_OTHER): Payer: PPO | Admitting: Nurse Practitioner

## 2021-06-16 VITALS — BP 132/69 | HR 91 | Temp 98.1°F | Resp 18 | Ht 65.0 in | Wt 106.4 lb

## 2021-06-16 DIAGNOSIS — D500 Iron deficiency anemia secondary to blood loss (chronic): Secondary | ICD-10-CM | POA: Insufficient documentation

## 2021-06-16 LAB — CBC WITH DIFFERENTIAL (CANCER CENTER ONLY)
Abs Immature Granulocytes: 0.01 10*3/uL (ref 0.00–0.07)
Basophils Absolute: 0 10*3/uL (ref 0.0–0.1)
Basophils Relative: 0 %
Eosinophils Absolute: 0.1 10*3/uL (ref 0.0–0.5)
Eosinophils Relative: 2 %
HCT: 35.7 % — ABNORMAL LOW (ref 36.0–46.0)
Hemoglobin: 11.2 g/dL — ABNORMAL LOW (ref 12.0–15.0)
Immature Granulocytes: 0 %
Lymphocytes Relative: 30 %
Lymphs Abs: 1.4 10*3/uL (ref 0.7–4.0)
MCH: 32 pg (ref 26.0–34.0)
MCHC: 31.4 g/dL (ref 30.0–36.0)
MCV: 102 fL — ABNORMAL HIGH (ref 80.0–100.0)
Monocytes Absolute: 0.6 10*3/uL (ref 0.1–1.0)
Monocytes Relative: 14 %
Neutro Abs: 2.4 10*3/uL (ref 1.7–7.7)
Neutrophils Relative %: 54 %
Platelet Count: 210 10*3/uL (ref 150–400)
RBC: 3.5 MIL/uL — ABNORMAL LOW (ref 3.87–5.11)
RDW: 14.6 % (ref 11.5–15.5)
WBC Count: 4.5 10*3/uL (ref 4.0–10.5)
nRBC: 0 % (ref 0.0–0.2)

## 2021-06-16 LAB — FERRITIN: Ferritin: 73 ng/mL (ref 11–307)

## 2021-06-16 NOTE — Progress Notes (Signed)
  Brittany Hensley   Diagnosis: Iron deficiency anemia  INTERVAL HISTORY:   Brittany Hensley returns as scheduled.  She received IV dextran 06/17/2020.  She felt better after the IV iron.  She thinks she may need an iron infusion in the near future.  She reports bleeding related to a recent kidney stone.  Objective:  Vital signs in last 24 hours:  Blood pressure 132/69, pulse 91, temperature 98.1 F (36.7 C), temperature source Oral, resp. rate 18, height 5\' 5"  (1.651 m), weight 106 lb 6.4 oz (48.3 kg), SpO2 100 %.     Resp: Lungs clear bilaterally. Cardio: Regular rate and rhythm. GI: No hepatosplenomegaly. Vascular: No leg edema.   Lab Results:  Lab Results  Component Value Date   WBC 4.5 06/16/2021   HGB 11.2 (L) 06/16/2021   HCT 35.7 (L) 06/16/2021   MCV 102.0 (H) 06/16/2021   PLT 210 06/16/2021   NEUTROABS 2.4 06/16/2021    Imaging:  No results found.  Medications: I have reviewed the patient's current medications.  Assessment/Plan: Chronic anemia secondary to gastrointestinal blood loss and iron deficiency, she last received IV iron 06/17/2020. History of kidney stones, followed by Dr. Roni Bread. Gram-negative sepsis and septic shock syndrome April 2008. History of acute renal failure secondary to obstruction and septic shock. History of adrenal insufficiency, no longer on hormone replacement.  Questionable allergic to Venofer in June 2010. She reported an elevated blood pressure and "dizziness" on the day following the Venofer therapy. She has tolerated iron dextran without an apparent reaction. History of a left forearm fracture.   Intermittent low-serum bicarbonate level, ? related to diarrhea or renal insufficiency Dysphagia, improved with erythromycin and esophageal dilatation procedures , status post dilatation of the gastric jejunal anastomosis 08/10/2014,02/15/2015, 11/12/2015, 08/10/2016, 05/20/2017 Chest x-ray 05/21/2017-COPD  changes with questionable nodular density inferior left chest. CT chest scheduled 06/07/2017. Right hip arthroplasty May 2019, left hip arthroplasty July 2019 COVID-19 infection January 2021 Osteoporosis  Disposition: Brittany Hensley appears stable.  Hemoglobin is slightly lower.  Ferritin level is pending.  Anticipate she will need IV iron.  We are going ahead and getting this set up for the next few weeks.  She will return for lab in 4 months and 8 months.  We will see her in follow-up in 12 months.  Patient seen with Dr. Benay Spice.    Ned Card ANP/GNP-BC   06/16/2021  3:10 PM This was a shared visit with Ned Card.  Brittany Hensley last received iron in November 2021.  The hemoglobin is lower today.  She will be scheduled for an iron infusion within the next few weeks.  I was present for greater than 50% of today's visit.  I performed medical decision making.  Julieanne Manson, MD

## 2021-06-17 ENCOUNTER — Telehealth: Payer: Self-pay

## 2021-06-17 NOTE — Telephone Encounter (Signed)
-----   Message from Dorisann Frames, RN sent at 06/17/2021  2:26 PM EST -----  ----- Message ----- From: Cleon Gustin, MD Sent: 06/17/2021   9:16 AM EST To: Dorisann Frames, RN  normal ----- Message ----- From: Dorisann Frames, RN Sent: 06/16/2021  10:58 AM EST To: Cleon Gustin, MD  Please review

## 2021-06-17 NOTE — Telephone Encounter (Signed)
Pt was called and is aware of results, she voiced understanding.

## 2021-06-18 ENCOUNTER — Telehealth: Payer: Self-pay

## 2021-06-18 LAB — CALCULI, WITH PHOTOGRAPH (CLINICAL LAB)
Calcium Oxalate Dihydrate: 90 %
Calcium Oxalate Monohydrate: 10 %
Weight Calculi: 140 mg

## 2021-06-18 NOTE — Telephone Encounter (Signed)
Pt called inquiring about her Ferritin level. Pt requested to have ferritin level result left on her answering machine. V/M message left informing Pt her Ferritin level was 73 and if she had any questions she could give the office a return call.

## 2021-06-18 NOTE — Progress Notes (Signed)
Results mailed along with litholink calcium oxalate information.,

## 2021-06-20 ENCOUNTER — Ambulatory Visit (HOSPITAL_COMMUNITY)
Admission: RE | Admit: 2021-06-20 | Discharge: 2021-06-20 | Disposition: A | Discharge status: Home / Self Care | Payer: PPO | Source: Ambulatory Visit | Attending: Urology | Admitting: Urology

## 2021-06-20 ENCOUNTER — Other Ambulatory Visit: Payer: Self-pay

## 2021-06-20 ENCOUNTER — Ambulatory Visit (HOSPITAL_COMMUNITY): Payer: PPO

## 2021-06-20 DIAGNOSIS — N200 Calculus of kidney: Secondary | ICD-10-CM | POA: Insufficient documentation

## 2021-06-20 DIAGNOSIS — I878 Other specified disorders of veins: Secondary | ICD-10-CM | POA: Diagnosis not present

## 2021-06-24 ENCOUNTER — Telehealth: Payer: Self-pay

## 2021-06-24 ENCOUNTER — Telehealth: Payer: Self-pay | Admitting: Nurse Practitioner

## 2021-06-24 NOTE — Telephone Encounter (Signed)
Patient called and notified. Voiced understanding.  

## 2021-06-24 NOTE — Telephone Encounter (Signed)
Please advise 

## 2021-06-24 NOTE — Telephone Encounter (Signed)
-----   Message from [name], MD sent at 06/23/2021  6:57 PM EST ----- The KUB shows stable right renal stones and the left renal stones are not clearly seen on renal US.  There are some stones bilaterally on the renal US.  She should keep her f/u in February ----- Message ----- From: Dorisann Frames, RN Sent: 06/23/2021   9:00 AM EST To: [name], MD  Please review

## 2021-06-30 ENCOUNTER — Other Ambulatory Visit: Payer: Self-pay | Admitting: Nurse Practitioner

## 2021-06-30 DIAGNOSIS — D500 Iron deficiency anemia secondary to blood loss (chronic): Secondary | ICD-10-CM

## 2021-06-30 MED ORDER — SUCRALFATE 1 G PO TABS: ORAL_TABLET | ORAL | 1 refills | Status: DC

## 2021-06-30 NOTE — Telephone Encounter (Signed)
  Prescription Request  06/30/2021  Is this a "Controlled Substance" medicine? no  Have you seen your PCP in the last 2 weeks? no  If YES, route message to pool  -  If NO, patient needs to be scheduled for appointment.  What is the name of the medication or equipment? Sulcrafate-1 gram (x3) per day  Have you contacted your pharmacy to request a refill? Yes, [facility] in [place] (they can't get this med any more)  Which pharmacy would you like this sent to? Only thru the [place] (Needs written RX to send in)   Patient notified that their request is being sent to the clinical staff for review and that they should receive a response within 2 business days.    MMM's pt.

## 2021-07-01 MED ORDER — SUCRALFATE 1 G PO TABS: ORAL_TABLET | ORAL | 1 refills | Status: DC

## 2021-07-01 NOTE — Telephone Encounter (Signed)
Only thru the [place] (Needs written RX to send in)  PLEASE call when ready to pick up

## 2021-07-01 NOTE — Addendum Note (Signed)
Addended by: Antonietta Barcelona D on: 07/01/2021 11:50 AM   Modules accepted: Orders

## 2021-07-02 NOTE — Telephone Encounter (Signed)
Pt aware written script is ready for pickup

## 2021-07-07 ENCOUNTER — Telehealth: Payer: Self-pay

## 2021-07-07 ENCOUNTER — Telehealth: Payer: Self-pay | Admitting: Oncology

## 2021-07-07 NOTE — Telephone Encounter (Signed)
Returning a voice message from PT. She wanting to check to see if she was schedule for an infusion on 07/08/21. Appointment time was giving to the Pt

## 2021-07-08 ENCOUNTER — Other Ambulatory Visit: Payer: Self-pay

## 2021-07-08 ENCOUNTER — Telehealth: Payer: Self-pay | Admitting: Oncology

## 2021-07-08 ENCOUNTER — Inpatient Hospital Stay: Payer: PPO | Attending: Oncology

## 2021-07-08 VITALS — BP 113/65 | HR 94 | Temp 98.1°F | Resp 18 | Wt 102.8 lb

## 2021-07-08 DIAGNOSIS — K922 Gastrointestinal hemorrhage, unspecified: Secondary | ICD-10-CM | POA: Diagnosis not present

## 2021-07-08 DIAGNOSIS — Z87442 Personal history of urinary calculi: Secondary | ICD-10-CM | POA: Diagnosis not present

## 2021-07-08 DIAGNOSIS — M810 Age-related osteoporosis without current pathological fracture: Secondary | ICD-10-CM | POA: Insufficient documentation

## 2021-07-08 DIAGNOSIS — D500 Iron deficiency anemia secondary to blood loss (chronic): Secondary | ICD-10-CM | POA: Insufficient documentation

## 2021-07-08 DIAGNOSIS — Z79899 Other long term (current) drug therapy: Secondary | ICD-10-CM | POA: Insufficient documentation

## 2021-07-08 MED ORDER — METHYLPREDNISOLONE SODIUM SUCC 125 MG IJ SOLR
125.0000 mg | Freq: Once | INTRAMUSCULAR | Status: AC
  Administered 2021-07-08: 125 mg via INTRAVENOUS

## 2021-07-08 MED ORDER — SODIUM CHLORIDE 0.9 % IV SOLN: Freq: Once | INTRAVENOUS | Status: AC

## 2021-07-08 MED ORDER — ACETAMINOPHEN 325 MG PO TABS
650.0000 mg | ORAL_TABLET | Freq: Once | ORAL | Status: AC
  Administered 2021-07-08: 650 mg via ORAL
  Filled 2021-07-08: qty 2

## 2021-07-08 MED ORDER — SODIUM CHLORIDE 0.9 % IV SOLN
300.0000 mg | Freq: Once | INTRAVENOUS | Status: AC
  Administered 2021-07-08: 300 mg via INTRAVENOUS
  Filled 2021-07-08: qty 15

## 2021-07-08 MED ORDER — LORATADINE 10 MG PO TABS: 10.0000 mg | ORAL_TABLET | Freq: Once | ORAL | Status: DC

## 2021-07-08 NOTE — Progress Notes (Signed)
Patient inquired about change in type of iron at beginning of appointment.  Per patient, she has received venofer in the past at a different facility and had a reaction.  Patient reported that it raised her BP and caused her to become nauseous.  Patient unable to state if she received premedications prior to that infusion.  Patient stated she was told she could get the iron dextran infusion during this visit as she has never had any issues with it.  Roselind Messier, pharmacist made aware.  Per Manuela Schwartz, patient switched to venofer due to product availability and insurance product preference.  Patient informed of this and agreed to continue with venofer infusion with premedications. Patient instructed to let RN know if she started to feel nauseous or felt different during venofer infusion. Patient verbalized agreement.    Patient tolerated infusion well and was monitored for 30 minutes post infusion without any issues. VSS upon leaving infusion room.

## 2021-07-08 NOTE — Patient Instructions (Signed)

## 2021-07-15 ENCOUNTER — Inpatient Hospital Stay: Payer: PPO

## 2021-07-15 ENCOUNTER — Other Ambulatory Visit: Payer: Self-pay

## 2021-07-15 VITALS — BP 114/67 | HR 88 | Temp 98.7°F | Resp 20

## 2021-07-15 DIAGNOSIS — D500 Iron deficiency anemia secondary to blood loss (chronic): Secondary | ICD-10-CM | POA: Diagnosis not present

## 2021-07-15 MED ORDER — METHYLPREDNISOLONE SODIUM SUCC 125 MG IJ SOLR
125.0000 mg | Freq: Once | INTRAMUSCULAR | Status: AC
  Administered 2021-07-15: 125 mg via INTRAVENOUS
  Filled 2021-07-15: qty 2

## 2021-07-15 MED ORDER — SODIUM CHLORIDE 0.9 % IV SOLN: Freq: Once | INTRAVENOUS | Status: AC

## 2021-07-15 MED ORDER — ACETAMINOPHEN 325 MG PO TABS
650.0000 mg | ORAL_TABLET | Freq: Once | ORAL | Status: AC
  Administered 2021-07-15: 650 mg via ORAL
  Filled 2021-07-15: qty 2

## 2021-07-15 MED ORDER — SODIUM CHLORIDE 0.9 % IV SOLN
300.0000 mg | Freq: Once | INTRAVENOUS | Status: AC
  Administered 2021-07-15: 300 mg via INTRAVENOUS
  Filled 2021-07-15: qty 15

## 2021-07-15 MED ORDER — LORATADINE 10 MG PO TABS
10.0000 mg | ORAL_TABLET | Freq: Once | ORAL | Status: DC
  Filled 2021-07-15: qty 1

## 2021-07-15 NOTE — Patient Instructions (Signed)

## 2021-07-22 ENCOUNTER — Inpatient Hospital Stay: Payer: PPO

## 2021-07-22 ENCOUNTER — Other Ambulatory Visit: Payer: Self-pay

## 2021-07-22 VITALS — BP 112/67 | HR 83 | Temp 98.1°F | Resp 18

## 2021-07-22 DIAGNOSIS — D500 Iron deficiency anemia secondary to blood loss (chronic): Secondary | ICD-10-CM

## 2021-07-22 MED ORDER — SODIUM CHLORIDE 0.9 % IV SOLN
300.0000 mg | Freq: Once | INTRAVENOUS | Status: AC
  Administered 2021-07-22: 10:00:00 300 mg via INTRAVENOUS
  Filled 2021-07-22: qty 15

## 2021-07-22 MED ORDER — SODIUM CHLORIDE 0.9 % IV SOLN
Freq: Once | INTRAVENOUS | Status: AC
Start: 2021-07-22 — End: 2021-07-22

## 2021-07-22 MED ORDER — ACETAMINOPHEN 325 MG PO TABS
650.0000 mg | ORAL_TABLET | Freq: Once | ORAL | Status: AC
  Administered 2021-07-22: 09:00:00 650 mg via ORAL
  Filled 2021-07-22: qty 2

## 2021-07-22 MED ORDER — LORATADINE 10 MG PO TABS: 10.0000 mg | ORAL_TABLET | Freq: Once | ORAL | Status: DC

## 2021-07-22 MED ORDER — METHYLPREDNISOLONE SODIUM SUCC 125 MG IJ SOLR
125.0000 mg | Freq: Once | INTRAMUSCULAR | Status: AC
  Administered 2021-07-22: 09:00:00 125 mg via INTRAVENOUS
  Filled 2021-07-22: qty 2

## 2021-07-22 NOTE — Patient Instructions (Signed)

## 2021-07-24 ENCOUNTER — Ambulatory Visit (INDEPENDENT_AMBULATORY_CARE_PROVIDER_SITE_OTHER): Payer: PPO | Admitting: Nurse Practitioner

## 2021-07-24 ENCOUNTER — Encounter: Payer: Self-pay | Admitting: Nurse Practitioner

## 2021-07-24 ENCOUNTER — Ambulatory Visit: Payer: PPO | Admitting: Nurse Practitioner

## 2021-07-24 VITALS — BP 116/70 | HR 78 | Temp 98.5°F | Resp 20 | Ht 65.0 in | Wt 104.0 lb

## 2021-07-24 DIAGNOSIS — L719 Rosacea, unspecified: Secondary | ICD-10-CM | POA: Diagnosis not present

## 2021-07-24 MED ORDER — METRONIDAZOLE 0.75 % EX GEL: 1.0000 "application " | Freq: Two times a day (BID) | CUTANEOUS | 1 refills | Status: DC

## 2021-07-24 NOTE — Patient Instructions (Signed)
Rosacea Rosacea is a long-term (chronic) condition that affects the skin of the face, including the cheeks, nose, forehead, and chin. This condition can also affect the eyes. Rosacea causes blood vessels near the surface of the skin to enlarge, which results in redness. What are the causes? The cause of this condition is not known. Certain triggers can make rosacea worse, including: Hot baths. Exercise. Sunlight. Very hot or cold temperatures. Hot or spicy foods and drinks. Drinking alcohol. Stress. Taking blood pressure medicine. Long-term use of topical steroids on the face. What increases the risk? You are more likely to develop this condition if you: Are older than 71 years of age. Are a woman. Have light-colored skin (light complexion). Have a family history of rosacea. What are the signs or symptoms? Symptoms of this condition include: Redness of the face. Red bumps or pimples on the face. A red, enlarged nose. Blushing easily. Red lines on the skin. Irritated, burning, or itchy feeling in the eyes. Swollen eyelids. Drainage from the eyes. Feeling like there is something in your eye. How is this diagnosed? This condition is diagnosed with a medical history and physical exam. How is this treated? There is no cure for this condition, but treatment can help to control your symptoms. Your health care provider may recommend that you see a skin specialist (dermatologist). Treatment may include: Medicines that are applied to the skin or taken by mouth (orally). This can include antibiotic medicines. Laser treatment to improve the appearance of the skin. Surgery. This is rare. Your health care provider will also recommend the best way to take care of your skin. Even after your skin improves, you will likely need to continue treatment to prevent your rosacea from coming back. Follow these instructions at home: Skin care Take care of your skin as told by your health care provider.  You may be told to do these things: Wash your skin gently two or more times each day. Use mild soap. Use a sunscreen or sunblock with SPF 30 or greater. Use gentle cosmetics that are meant for sensitive skin. Shave with an electric shaver instead of a blade. Lifestyle Try to keep track of what foods trigger this condition. Avoid any triggers. These may include: Spicy foods. Seafood. Cheese. Hot liquids. Nuts. Chocolate. Iodized salt. Do not drink alcohol. Avoid extremely cold or hot temperatures. Try to reduce your stress. If you need help, talk with your health care provider. When you exercise, do these things to stay cool: Limit sun exposure to your face. Use a fan. Do shorter and more frequent intervals of exercise. General instructions Take and apply over-the-counter and prescription medicines only as told by your health care provider. If you were prescribed an antibiotic medicine, apply it or take it as told by your health care provider. Do not stop using the antibiotic even if your condition improves. If your eyelids are affected, apply warm compresses to them. Do this as told by your health care provider. Keep all follow-up visits as told by your health care provider. This is important. Contact a health care provider if: Your symptoms get worse. Your symptoms do not improve after 2 months of treatment. You have new symptoms. You have any changes in vision or you have problems with your eyes, such as redness or itching. You feel depressed. You lose your appetite. You have trouble concentrating. Summary Rosacea is a long-term (chronic) condition that affects the skin of the face, including the cheeks, nose, forehead, and chin. Take  care of your skin as told by your health care provider. Take and apply over-the-counter and prescription medicines only as told by your health care provider. Contact a health care provider if your symptoms get worse or if you have any changes in  vision or other problems with your eyes, such as redness or itching. Keep all follow-up visits as told by your health care provider. This is important. This information is not intended to replace advice given to you by your health care provider. Make sure you discuss any questions you have with your health care provider. Document Revised: 12/22/2017 Document Reviewed: 12/22/2017 Elsevier Patient Education  2022 [employer].

## 2021-07-24 NOTE — Progress Notes (Signed)
° °  Subjective:    Patient ID: Brittany Hensley, female    DOB: 03/09/1951, 70 y.o.   MRN: [id number]   Chief Complaint: rosacea  HPI Patient was dx with rosacea years ago. Has had a flare up. She has not been using anything in it for several years. She has been under a lot of stress which she thinks has caused flare up. She found some metrogel in her cabinet and used it yesterday and has already helped. Needs new prescriptions.    Review of Systems  Constitutional:  Negative for diaphoresis.  Eyes:  Negative for pain.  Respiratory:  Negative for shortness of breath.   Cardiovascular:  Negative for chest pain, palpitations and leg swelling.  Gastrointestinal:  Negative for abdominal pain.  Endocrine: Negative for polydipsia.  Skin:  Negative for rash.  Neurological:  Negative for dizziness, weakness and headaches.  Hematological:  Does not bruise/bleed easily.  All other systems reviewed and are negative.     Objective:   Physical Exam Vitals reviewed.  Constitutional:      Appearance: Normal appearance.  Cardiovascular:     Rate and Rhythm: Normal rate and regular rhythm.     Heart sounds: Normal heart sounds.  Pulmonary:     Effort: Pulmonary effort is normal.     Breath sounds: Normal breath sounds.  Skin:    General: Skin is warm.     Comments: Erythematous areas on bil cheeks and nose.  Neurological:     General: No focal deficit present.     Mental Status: She is alert and oriented to person, place, and time.  Psychiatric:        Mood and Affect: Mood normal.        Behavior: Behavior normal.    BP 116/70    Pulse 78    Temp 98.5 F (36.9 C) (Temporal)    Resp 20    Ht 5\' 5"  (1.651 m)    Wt 104 lb (47.2 kg)    SpO2 100%    BMI 17.31 kg/m        Assessment & Plan:  Brittany Hensley in today with chief complaint of Red areas to face (Had an old tube of metrogel and started it yesterday)   1. Rosacea Cool compresses Do not pick or scratch  Meds ordered  this encounter  Medications   metroNIDAZOLE (METROGEL) 0.75 % gel    Sig: Apply 1 application topically 2 (two) times daily.    Dispense:  45 g    Refill:  1    Order Specific Question:   Supervising Provider    Answer:   Caryl Pina A [[id number]]      The above assessment and management plan was discussed with the patient. The patient verbalized understanding of and has agreed to the management plan. Patient is aware to call the clinic if symptoms persist or worsen. Patient is aware when to return to the clinic for a follow-up visit. Patient educated on when it is appropriate to go to the emergency department.   Mary-Margaret Hassell Done, FNP

## 2021-08-03 DIAGNOSIS — C4492 Squamous cell carcinoma of skin, unspecified: Secondary | ICD-10-CM

## 2021-08-03 HISTORY — DX: Squamous cell carcinoma of skin, unspecified: C44.92

## 2021-08-13 ENCOUNTER — Ambulatory Visit (INDEPENDENT_AMBULATORY_CARE_PROVIDER_SITE_OTHER): Payer: PPO | Admitting: Physician Assistant

## 2021-08-13 ENCOUNTER — Other Ambulatory Visit: Payer: Self-pay

## 2021-08-13 ENCOUNTER — Encounter: Payer: Self-pay | Admitting: Physician Assistant

## 2021-08-13 DIAGNOSIS — L570 Actinic keratosis: Secondary | ICD-10-CM

## 2021-08-13 DIAGNOSIS — D485 Neoplasm of uncertain behavior of skin: Secondary | ICD-10-CM

## 2021-08-13 DIAGNOSIS — D0439 Carcinoma in situ of skin of other parts of face: Secondary | ICD-10-CM | POA: Diagnosis not present

## 2021-08-13 DIAGNOSIS — L739 Follicular disorder, unspecified: Secondary | ICD-10-CM | POA: Diagnosis not present

## 2021-08-13 DIAGNOSIS — C4492 Squamous cell carcinoma of skin, unspecified: Secondary | ICD-10-CM

## 2021-08-13 DIAGNOSIS — C44329 Squamous cell carcinoma of skin of other parts of face: Secondary | ICD-10-CM | POA: Diagnosis not present

## 2021-08-13 HISTORY — DX: Squamous cell carcinoma of skin, unspecified: C44.92

## 2021-08-13 MED ORDER — MUPIROCIN 2 % EX OINT: 1.0000 "application " | TOPICAL_OINTMENT | Freq: Two times a day (BID) | CUTANEOUS | 1 refills | Status: DC

## 2021-08-13 NOTE — Patient Instructions (Signed)

## 2021-08-14 ENCOUNTER — Encounter: Payer: Self-pay | Admitting: Physician Assistant

## 2021-08-14 NOTE — Progress Notes (Signed)
New Patient   Subjective  Brittany Hensley is a 71 y.o. female who presents for the following: New Patient (Initial Visit) (Patient here today for redness on her face x 2-3 months ago, per patient she was given Metrogel in the past and it helped so patient's PCP gave her a prescription for the Metrogel and it's helped. No personal history or family history of atypical moles, melanoma or non mole skin cancer. ).   The following portions of the chart were reviewed this encounter and updated as appropriate:  Tobacco   Allergies   Meds   Problems   Med Hx   Surg Hx   Fam Hx       Objective  Well appearing patient in no apparent distress; mood and affect are within normal limits.  A focused examination was performed including face and arms. Relevant physical exam findings are noted in the Assessment and Plan.  Right Zygomatic Area Hyperkeratotic scale with pink base        Left Malar Cheek Hyperkeratotic scale with pink base      Right Nasal Sidewall Hyperkeratotic scale with pink base        Right Forehead Hyperkeratotic scale with pink base        Left Temple Hyperkeratotic scale with pink base       Assessment & Plan  Neoplasm of uncertain behavior of skin (5) Right Zygomatic Area  Skin / nail biopsy Type of biopsy: tangential   Informed consent: discussed and consent obtained   Timeout: patient name, date of birth, surgical site, and procedure verified   Anesthesia: the lesion was anesthetized in a standard fashion   Anesthetic:  1% lidocaine w/ epinephrine 1-100,000 local infiltration Instrument used: flexible razor blade   Hemostasis achieved with: ferric subsulfate   Outcome: patient tolerated procedure well   Post-procedure details: wound care instructions given    Specimen 1 - Surgical pathology Differential Diagnosis: SCC VS BCC  Check Margins: No  Left Malar Cheek  Skin / nail biopsy Type of biopsy: tangential   Informed consent:  discussed and consent obtained   Timeout: patient name, date of birth, surgical site, and procedure verified   Anesthesia: the lesion was anesthetized in a standard fashion   Anesthetic:  1% lidocaine w/ epinephrine 1-100,000 local infiltration Instrument used: flexible razor blade   Hemostasis achieved with: ferric subsulfate   Outcome: patient tolerated procedure well   Post-procedure details: wound care instructions given    Specimen 2 - Surgical pathology Differential Diagnosis: SCC VS BCC  Check Margins: No  Right Nasal Sidewall  Skin / nail biopsy Type of biopsy: tangential   Informed consent: discussed and consent obtained   Timeout: patient name, date of birth, surgical site, and procedure verified   Anesthesia: the lesion was anesthetized in a standard fashion   Anesthetic:  1% lidocaine w/ epinephrine 1-100,000 local infiltration Instrument used: flexible razor blade   Hemostasis achieved with: ferric subsulfate   Outcome: patient tolerated procedure well   Post-procedure details: wound care instructions given    Specimen 3 - Surgical pathology Differential Diagnosis: SCC VS BCC  Check Margins: No  Right Forehead  Skin / nail biopsy Type of biopsy: tangential   Informed consent: discussed and consent obtained   Timeout: patient name, date of birth, surgical site, and procedure verified   Anesthesia: the lesion was anesthetized in a standard fashion   Anesthetic:  1% lidocaine w/ epinephrine 1-100,000 local infiltration Instrument used: flexible razor  blade   Hemostasis achieved with: ferric subsulfate   Outcome: patient tolerated procedure well   Post-procedure details: wound care instructions given    Specimen 4 - Surgical pathology Differential Diagnosis: SCC VS BCC  Check Margins: No  Left Temple  Skin / nail biopsy Type of biopsy: tangential   Informed consent: discussed and consent obtained   Timeout: patient name, date of birth, surgical site,  and procedure verified   Anesthesia: the lesion was anesthetized in a standard fashion   Anesthetic:  1% lidocaine w/ epinephrine 1-100,000 local infiltration Instrument used: flexible razor blade   Hemostasis achieved with: ferric subsulfate   Outcome: patient tolerated procedure well   Post-procedure details: wound care instructions given    Specimen 5 - Surgical pathology Differential Diagnosis: SCC VS BCC  Check Margins: No   No atypical nevi noted at the time of the visit.  I, Jovon Winterhalter, PA-C, have reviewed all documentation's for this visit.  The documentation on 08/14/21 for the exam, diagnosis, procedures and orders are all accurate and complete.

## 2021-08-20 ENCOUNTER — Telehealth: Payer: Self-pay | Admitting: *Deleted

## 2021-08-20 NOTE — Telephone Encounter (Signed)
Pathology to patient-surgery appointment scheduled.  

## 2021-08-20 NOTE — Telephone Encounter (Signed)
-----   Message from Warren Danes, [place] sent at 08/19/2021  4:32 PM EST ----- 30

## 2021-08-26 ENCOUNTER — Other Ambulatory Visit: Payer: Self-pay | Admitting: Nurse Practitioner

## 2021-08-26 DIAGNOSIS — L719 Rosacea, unspecified: Secondary | ICD-10-CM

## 2021-09-03 ENCOUNTER — Ambulatory Visit (INDEPENDENT_AMBULATORY_CARE_PROVIDER_SITE_OTHER): Payer: PPO | Admitting: Physician Assistant

## 2021-09-03 ENCOUNTER — Encounter: Payer: Self-pay | Admitting: Physician Assistant

## 2021-09-03 ENCOUNTER — Other Ambulatory Visit: Payer: Self-pay

## 2021-09-03 DIAGNOSIS — C44320 Squamous cell carcinoma of skin of unspecified parts of face: Secondary | ICD-10-CM

## 2021-09-03 DIAGNOSIS — L570 Actinic keratosis: Secondary | ICD-10-CM

## 2021-09-03 DIAGNOSIS — C44329 Squamous cell carcinoma of skin of other parts of face: Secondary | ICD-10-CM

## 2021-09-03 DIAGNOSIS — D0439 Carcinoma in situ of skin of other parts of face: Secondary | ICD-10-CM

## 2021-09-03 DIAGNOSIS — D0430 Carcinoma in situ of skin of unspecified part of face: Secondary | ICD-10-CM

## 2021-09-03 MED ORDER — TOLAK 4 % EX CREA: TOPICAL_CREAM | CUTANEOUS | 1 refills | Status: DC

## 2021-09-03 NOTE — Progress Notes (Signed)
° °  Follow-Up Visit   Subjective  Brittany Hensley is a 71 y.o. female who presents for the following: Procedure (Scc in situ left malar cheek, right forehead well diff scc, left temple scc in situ).   The following portions of the chart were reviewed this encounter and updated as appropriate:  Tobacco   Allergies   Meds   Problems   Med Hx   Surg Hx   Fam Hx       Objective  Well appearing patient in no apparent distress; mood and affect are within normal limits.  A focused examination was performed including face. Relevant physical exam findings are noted in the Assessment and Plan.  Left Malar Cheek Pink macule  Left Temple Pink macule  Right Forehead Pink macule  face Erythematous patches with gritty scale.   Assessment & Plan  Squamous cell carcinoma in situ (SCCIS) of skin of face (2) Left Malar Cheek  Destruction of lesion Complexity: simple   Destruction method: electrodesiccation and curettage   Informed consent: discussed and consent obtained   Timeout:  patient name, date of birth, surgical site, and procedure verified Anesthesia: the lesion was anesthetized in a standard fashion   Anesthetic:  1% lidocaine w/ epinephrine 1-100,000 local infiltration Curettage performed in three different directions: Yes   Electrodesiccation performed over the curetted area: Yes   Curettage cycles:  3 Final wound size (cm):  1.9 Hemostasis achieved with:  ferric subsulfate Outcome: patient tolerated procedure well with no complications   Additional details:  Wound innoculated with 5 fluorouracil solution.  Left Temple  Destruction of lesion Complexity: simple   Destruction method: electrodesiccation and curettage   Informed consent: discussed and consent obtained   Timeout:  patient name, date of birth, surgical site, and procedure verified Anesthesia: the lesion was anesthetized in a standard fashion   Anesthetic:  1% lidocaine w/ epinephrine 1-100,000 local  infiltration Curettage performed in three different directions: Yes   Electrodesiccation performed over the curetted area: Yes   Curettage cycles:  3 Final wound size (cm):  2.5 Hemostasis achieved with:  ferric subsulfate Outcome: patient tolerated procedure well with no complications   Additional details:  Wound innoculated with 5 fluorouracil solution.  SCC (squamous cell carcinoma), face Right Forehead  Destruction of lesion Complexity: simple   Destruction method: electrodesiccation and curettage   Informed consent: discussed and consent obtained   Timeout:  patient name, date of birth, surgical site, and procedure verified Anesthesia: the lesion was anesthetized in a standard fashion   Anesthetic:  1% lidocaine w/ epinephrine 1-100,000 local infiltration Curettage performed in three different directions: Yes   Electrodesiccation performed over the curetted area: Yes   Curettage cycles:  3 Final wound size (cm):  4.5 Hemostasis achieved with:  ferric subsulfate Outcome: patient tolerated procedure well with no complications   Additional details:  Wound innoculated with 5 fluorouracil solution.  AK (actinic keratosis) face  Fluorouracil (TOLAK) 4 % CREA - face Apply to face qhs x 2 weeks    I, Trinh Sanjose, PA-C, have reviewed all documentation's for this visit.  The documentation on 09/03/21 for the exam, diagnosis, procedures and orders are all accurate and complete.

## 2021-09-03 NOTE — Patient Instructions (Signed)

## 2021-09-11 ENCOUNTER — Telehealth: Payer: Self-pay | Admitting: Physician Assistant

## 2021-09-11 ENCOUNTER — Other Ambulatory Visit: Payer: Self-pay | Admitting: Physician Assistant

## 2021-09-11 ENCOUNTER — Other Ambulatory Visit: Payer: Self-pay | Admitting: Gastroenterology

## 2021-09-11 ENCOUNTER — Ambulatory Visit: Payer: PPO | Admitting: Urology

## 2021-09-11 NOTE — Telephone Encounter (Signed)
Wants to talk to nurse: wants to know if the cancer will spread and if she can use the antibiotic cream (Mupirocin) during the day and then the Tolak at night

## 2021-09-11 NOTE — Telephone Encounter (Signed)
Phone call from patient needing to know how to use mupirocin and tolak cream. I told her to use mupirocin on her surgery wounds and wait until they heal up before starting the tolak treatment. She will call with any problems or concerns.

## 2021-09-17 ENCOUNTER — Ambulatory Visit: Payer: PPO

## 2021-09-22 ENCOUNTER — Encounter (HOSPITAL_COMMUNITY): Payer: Self-pay | Admitting: Gastroenterology

## 2021-09-24 ENCOUNTER — Other Ambulatory Visit: Payer: Self-pay | Admitting: Nurse Practitioner

## 2021-09-24 ENCOUNTER — Ambulatory Visit: Payer: PPO

## 2021-09-30 ENCOUNTER — Encounter (HOSPITAL_COMMUNITY)
Admission: RE | Disposition: A | Discharge status: Home / Self Care | Payer: Self-pay | Source: Home / Self Care | Attending: Gastroenterology

## 2021-09-30 ENCOUNTER — Ambulatory Visit (HOSPITAL_COMMUNITY): Payer: PPO | Admitting: Anesthesiology

## 2021-09-30 ENCOUNTER — Ambulatory Visit (HOSPITAL_BASED_OUTPATIENT_CLINIC_OR_DEPARTMENT_OTHER): Payer: PPO | Admitting: Anesthesiology

## 2021-09-30 ENCOUNTER — Other Ambulatory Visit: Payer: Self-pay

## 2021-09-30 ENCOUNTER — Encounter (HOSPITAL_COMMUNITY): Payer: Self-pay | Admitting: Gastroenterology

## 2021-09-30 ENCOUNTER — Ambulatory Visit (HOSPITAL_COMMUNITY)
Admission: RE | Admit: 2021-09-30 | Discharge: 2021-09-30 | Disposition: A | Discharge status: Home / Self Care | Payer: PPO | Attending: Gastroenterology | Admitting: Gastroenterology

## 2021-09-30 DIAGNOSIS — K219 Gastro-esophageal reflux disease without esophagitis: Secondary | ICD-10-CM | POA: Diagnosis not present

## 2021-09-30 DIAGNOSIS — Z980 Intestinal bypass and anastomosis status: Secondary | ICD-10-CM | POA: Diagnosis not present

## 2021-09-30 DIAGNOSIS — K9189 Other postprocedural complications and disorders of digestive system: Secondary | ICD-10-CM | POA: Diagnosis not present

## 2021-09-30 DIAGNOSIS — M1990 Unspecified osteoarthritis, unspecified site: Secondary | ICD-10-CM | POA: Diagnosis not present

## 2021-09-30 DIAGNOSIS — Y832 Surgical operation with anastomosis, bypass or graft as the cause of abnormal reaction of the patient, or of later complication, without mention of misadventure at the time of the procedure: Secondary | ICD-10-CM | POA: Diagnosis not present

## 2021-09-30 DIAGNOSIS — K449 Diaphragmatic hernia without obstruction or gangrene: Secondary | ICD-10-CM

## 2021-09-30 DIAGNOSIS — D649 Anemia, unspecified: Secondary | ICD-10-CM | POA: Diagnosis not present

## 2021-09-30 DIAGNOSIS — R112 Nausea with vomiting, unspecified: Secondary | ICD-10-CM | POA: Diagnosis not present

## 2021-09-30 DIAGNOSIS — K2950 Unspecified chronic gastritis without bleeding: Secondary | ICD-10-CM | POA: Insufficient documentation

## 2021-09-30 DIAGNOSIS — E039 Hypothyroidism, unspecified: Secondary | ICD-10-CM | POA: Insufficient documentation

## 2021-09-30 DIAGNOSIS — K9131 Postprocedural partial intestinal obstruction: Secondary | ICD-10-CM | POA: Diagnosis not present

## 2021-09-30 DIAGNOSIS — D638 Anemia in other chronic diseases classified elsewhere: Secondary | ICD-10-CM | POA: Diagnosis not present

## 2021-09-30 HISTORY — PX: ESOPHAGOGASTRODUODENOSCOPY (EGD) WITH PROPOFOL: SHX5813

## 2021-09-30 HISTORY — PX: BALLOON DILATION: SHX5330

## 2021-09-30 SURGERY — ESOPHAGOGASTRODUODENOSCOPY (EGD) WITH PROPOFOL
Anesthesia: Monitor Anesthesia Care

## 2021-09-30 MED ORDER — PROPOFOL 10 MG/ML IV BOLUS
INTRAVENOUS | Status: DC | PRN
  Administered 2021-09-30: 30 mg via INTRAVENOUS
  Administered 2021-09-30 (×2): 20 mg via INTRAVENOUS

## 2021-09-30 MED ORDER — PROPOFOL 10 MG/ML IV BOLUS
INTRAVENOUS | Status: AC
  Filled 2021-09-30: qty 20

## 2021-09-30 MED ORDER — LIDOCAINE 2% (20 MG/ML) 5 ML SYRINGE
INTRAMUSCULAR | Status: DC | PRN
  Administered 2021-09-30 (×2): 50 mg via INTRAVENOUS

## 2021-09-30 MED ORDER — LACTATED RINGERS IV SOLN: INTRAVENOUS | Status: DC

## 2021-09-30 MED ORDER — PROPOFOL 500 MG/50ML IV EMUL
INTRAVENOUS | Status: AC
  Filled 2021-09-30: qty 50

## 2021-09-30 MED ORDER — PROPOFOL 500 MG/50ML IV EMUL
INTRAVENOUS | Status: DC | PRN
  Administered 2021-09-30: 125 ug/kg/min via INTRAVENOUS

## 2021-09-30 MED ORDER — SODIUM CHLORIDE 0.9 % IV SOLN: INTRAVENOUS | Status: DC

## 2021-09-30 SURGICAL SUPPLY — 15 items

## 2021-09-30 NOTE — Anesthesia Postprocedure Evaluation (Signed)
Anesthesia Post Note  Patient: Brittany Hensley  Procedure(s) Performed: ESOPHAGOGASTRODUODENOSCOPY (EGD) WITH PROPOFOL BALLOON DILATION     Patient location during evaluation: Endoscopy Anesthesia Type: MAC Level of consciousness: awake and alert Pain management: pain level controlled Vital Signs Assessment: post-procedure vital signs reviewed and stable Respiratory status: spontaneous breathing, nonlabored ventilation and respiratory function stable Cardiovascular status: stable and blood pressure returned to baseline Postop Assessment: no apparent nausea or vomiting Anesthetic complications: no   No notable events documented.  Last Vitals:  Vitals:   09/30/21 1310 09/30/21 1316  BP: (!) 90/47 117/74  Pulse: 83 89  Resp: 15 (!) 23  Temp:    SpO2: 94% 98%    Last Pain:  Vitals:   09/30/21 1316  TempSrc:   PainSc: 0-No pain                 Saranya Harlin,W. EDMOND

## 2021-09-30 NOTE — Anesthesia Preprocedure Evaluation (Addendum)
Anesthesia Evaluation  Patient identified by MRN, date of birth, ID band Patient awake    Reviewed: Allergy & Precautions, H&P , NPO status , Patient's Chart, lab work & pertinent test results  Airway Mallampati: II  TM Distance: >3 FB Neck ROM: Full    Dental no notable dental hx. (+) Teeth Intact, Dental Advisory Given   Pulmonary neg pulmonary ROS,    Pulmonary exam normal breath sounds clear to auscultation       Cardiovascular negative cardio ROS   Rhythm:Regular Rate:Normal     Neuro/Psych Anxiety Depression negative neurological ROS     GI/Hepatic Neg liver ROS, GERD  Medicated,  Endo/Other  Hypothyroidism   Renal/GU negative Renal ROS  negative genitourinary   Musculoskeletal  (+) Arthritis , Osteoarthritis,    Abdominal   Peds  Hematology  (+) Blood dyscrasia, anemia ,   Anesthesia Other Findings   Reproductive/Obstetrics negative OB ROS                            Anesthesia Physical Anesthesia Plan  ASA: 2  Anesthesia Plan: MAC   Post-op Pain Management: Minimal or no pain anticipated   Induction: Intravenous  PONV Risk Score and Plan: 2 and Propofol infusion and Treatment may vary due to age or medical condition  Airway Management Planned: Natural Airway and Simple Face Mask  Additional Equipment:   Intra-op Plan:   Post-operative Plan:   Informed Consent: I have reviewed the patients History and Physical, chart, labs and discussed the procedure including the risks, benefits and alternatives for the proposed anesthesia with the patient or authorized representative who has indicated his/her understanding and acceptance.     Dental advisory given  Plan Discussed with: CRNA  Anesthesia Plan Comments:         Anesthesia Quick Evaluation

## 2021-09-30 NOTE — Op Note (Signed)
[facility] Patient Name: Brittany Hensley Procedure Date: 09/30/2021 MRN: [id number] Attending MD: Clarene Essex , MD Date of Birth: Jul 29, 1951 CSN: [id number] Age: 71 Admit Type: Outpatient Procedure:                Upper GI endoscopy Indications:              Management of operative complication: Dilation of                            anastomotic stricture Providers:                Clarene Essex, MD, Debi Mays, RN, Luan Moore,                            Technician, [address]. Brittany Resides CRNA, CRNA Referring MD:              Medicines:                Propofol total dose 150 mg IV, 100 mg IV lidocaine Complications:            No immediate complications. Estimated Blood Loss:     Estimated blood loss: none. Procedure:                Pre-Anesthesia Assessment:                           - Prior to the procedure, a History and Physical                            was performed, and patient medications and                            allergies were reviewed. The patient's tolerance of                            previous anesthesia was also reviewed. The risks                            and benefits of the procedure and the sedation                            options and risks were discussed with the patient.                            All questions were answered, and informed consent                            was obtained. Prior Anticoagulants: The patient has                            taken no previous anticoagulant or antiplatelet                            agents. ASA Grade Assessment: III - A patient with  severe systemic disease. After reviewing the risks                            and benefits, the patient was deemed in                            satisfactory condition to undergo the procedure.                           After obtaining informed consent, the endoscope was                            passed under direct vision. Throughout the                             procedure, the patient's blood pressure, pulse, and                            oxygen saturations were monitored continuously. The                            GIF-H190 ([id number]) Olympus endoscope was introduced                            through the mouth, and advanced to the jejunum. The                            upper GI endoscopy was accomplished without                            difficulty. The patient tolerated the procedure                            well. Scope In: Scope Out: Findings:      The larynx was normal.      A Tiny hiatal hernia was present.      Diffuse mild inflammation characterized by congestion (edema) and       erythema was found in the gastric body.      Evidence of a stenosed Billroth I gastroduodenostomy was found. A       gastric pouch was found. The gastroduodenal anastomosis was       characterized by mild stenosis. This was traversed. A TTS dilator was       passed through the scope. Dilation with an 18-19-20 mm balloon dilator       was performed to 20 mm. The dilation site was examined and showed mild       mucosal disruption.      The examined jejunum was normal.      The exam was otherwise without abnormality. Impression:               - Normal larynx.                           - Tiny hiatal hernia.                           -  Chronic gastritis.                           - Stenosed Billroth I gastroduodenostomy was found,                            characterized by mild stenosis. Dilated.                           - Normal examined jejunum.                           - The examination was otherwise normal.                           - No specimens collected. Moderate Sedation:      Not Applicable - Patient had care per Anesthesia. Recommendation:           - Patient has a contact number available for                            emergencies. The signs and symptoms of potential                            delayed complications were  discussed with the                            patient. Return to normal activities tomorrow.                            Written discharge instructions were provided to the                            patient.                           - Full liquid diet for 1 hour.                           - Continue present medications.                           - Return to GI clinic PRN.                           - Telephone GI clinic if symptomatic PRN. Procedure Code(s):        --- Professional ---                           [id number], Esophagogastroduodenoscopy, flexible,                            transoral; with dilation of gastric/duodenal                            stricture(s) (eg, balloon, bougie) Diagnosis Code(s):        --- Professional ---  K44.9, Diaphragmatic hernia without obstruction or                            gangrene                           K29.50, Unspecified chronic gastritis without                            bleeding                           Z98.0, Intestinal bypass and anastomosis status                           K91.89, Other postprocedural complications and                            disorders of digestive system CPT copyright 2019 [employer]. All rights reserved. The codes documented in this report are preliminary and upon coder review may  be revised to meet current compliance requirements. Clarene Essex, MD 09/30/2021 12:53:42 PM This report has been signed electronically. Number of Addenda: 0

## 2021-09-30 NOTE — Discharge Instructions (Signed)
Slowly advance diet as tolerated beginning with liquids at first and call if question or problem and follow-up in the office as needed

## 2021-09-30 NOTE — Anesthesia Procedure Notes (Signed)
Procedure Name: MAC Date/Time: 09/30/2021 12:26 PM Performed by: Lollie Sails, CRNA Pre-anesthesia Checklist: Patient identified, Emergency Drugs available, Suction available, Patient being monitored and Timeout performed Oxygen Delivery Method: Simple face mask Preoxygenation: POM used. Placement Confirmation: positive ETCO2

## 2021-09-30 NOTE — Progress Notes (Signed)
Brittany Hensley 12:24 PM  Subjective: Patient seen and examined and no new medical issues since I have seen her and we reviewed discussed her previous dilation and reviewed her hospital computer chart and she is not on any aspirin or blood thinner and has no new complaints  Objective: Vital signs stable afebrile no acute distress exam please see preassessment evaluation  Assessment: Anastomotic stricture  Plan: Okay to proceed with endoscopy with balloon dilatation with anesthesia assistance  [facility]  office [phone] After 5PM or if no answer call [phone]

## 2021-09-30 NOTE — Transfer of Care (Signed)
Immediate Anesthesia Transfer of Care Note  Patient: Brittany Hensley  Procedure(s) Performed: ESOPHAGOGASTRODUODENOSCOPY (EGD) WITH PROPOFOL BALLOON DILATION  Patient Location: PACU and Endoscopy Unit  Anesthesia Type:MAC  Level of Consciousness: awake, alert  and patient cooperative  Airway & Oxygen Therapy: Patient Spontanous Breathing and Patient connected to face mask oxygen  Post-op Assessment: Report given to RN and Post -op Vital signs reviewed and stable  Post vital signs: Reviewed and stable  Last Vitals:  Vitals Value Taken Time  BP 84/49 09/30/21 1245  Temp 36.2 C 09/30/21 1245  Pulse 81 09/30/21 1246  Resp 16 09/30/21 1246  SpO2 99 % 09/30/21 1246  Vitals shown include unvalidated device data.  Last Pain:  Vitals:   09/30/21 1245  TempSrc: Temporal  PainSc:          Complications: No notable events documented.

## 2021-10-01 ENCOUNTER — Encounter (HOSPITAL_COMMUNITY): Payer: Self-pay | Admitting: Gastroenterology

## 2021-10-08 ENCOUNTER — Ambulatory Visit (INDEPENDENT_AMBULATORY_CARE_PROVIDER_SITE_OTHER): Payer: PPO

## 2021-10-08 VITALS — Ht 65.0 in | Wt 104.0 lb

## 2021-10-08 DIAGNOSIS — Z0000 Encounter for general adult medical examination without abnormal findings: Secondary | ICD-10-CM

## 2021-10-08 NOTE — Progress Notes (Signed)
Subjective:   Brittany Hensley is a 71 y.o. female who presents for an Initial Medicare Annual Wellness Visit. Virtual Visit via Telephone Note  I connected with  Brittany Hensley on 10/08/21 at 11:15 AM EST by telephone and verified that I am speaking with the correct person using two identifiers.  Location: Patient: HOME Provider: [facility] Persons participating in the virtual visit: patient/Nurse Health Advisor   I discussed the limitations, risks, security and privacy concerns of performing an evaluation and management service by telephone and the availability of in person appointments. The patient expressed understanding and agreed to proceed.  Interactive audio and video telecommunications were attempted between this nurse and patient, however failed, due to patient having technical difficulties OR patient did not have access to video capability.  We continued and completed visit with audio only.  Some vital signs may be absent or patient reported.   Chriss Driver, LPN  Review of Systems     Cardiac Risk Factors include: advanced age (>35mn, >>26women);Other (see comment), Risk factor comments: Anemia, Thyroid dx, Osteoporosis     Objective:    Today's Vitals   10/08/21 1116 10/08/21 1117  Weight: 104 lb (47.2 kg)   Height: '5\' 5"'$  (1.651 m)   PainSc:  0-No pain   Body mass index is 17.31 kg/m.  Advanced Directives 10/08/2021 09/30/2021 07/22/2021 07/15/2021 07/08/2021 05/06/2021 04/14/2021  Does Patient Have a Medical Advance Directive? No No No No No No No  Would patient like information on creating a medical advance directive? No - Patient declined No - Patient declined No - Patient declined No - Patient declined No - Patient declined No - Patient declined No - Guardian declined    Current Medications (verified) Outpatient Encounter Medications as of 10/08/2021  Medication Sig   acetaminophen (TYLENOL) 500 MG tablet Take 500 mg by mouth 2 (two) times daily.   albuterol  (VENTOLIN HFA) 108 (90 Base) MCG/ACT inhaler USE 2 PUFFS EVERY 6 HOURS AS NEEDED   ALPRAZolam (XANAX) 0.5 MG tablet Take 1 tablet (0.5 mg total) by mouth 4 (four) times daily as needed for anxiety.   Ascorbic Acid (VITAMIN C) 1000 MG tablet Take 1,000 mg by mouth daily.   Biotin 10 MG CAPS Take 10 mg by mouth daily.   BLACK ELDERBERRY PO Take 50 mg by mouth daily.   Calcium Carbonate (CALCIUM 600 PO) Take 600 mg by mouth 2 (two) times daily.    Cholecalciferol (VITAMIN D) 2000 units tablet Take 2,000 Units by mouth 2 (two) times daily.   diclofenac Sodium (VOLTAREN) 1 % GEL APPLY 4 GRAMS 4 TIMES DAILY (Patient taking differently: 2 g daily as needed (Hip pain).)   docusate sodium (COLACE) 100 MG capsule Take 1 capsule (100 mg total) by mouth 2 (two) times daily. (Patient taking differently: Take 100 mg by mouth daily.)   erythromycin base (E-MYCIN) 500 MG tablet 1 po 1 hour prior to dental procedure and 1 po after procedure   Fluorouracil (TOLAK) 4 % CREA Apply to face qhs x 2 weeks   folic acid (FOLVITE) 8322MCG tablet Take 1 tablet (800 mcg total) by mouth daily.   levothyroxine (SYNTHROID) 112 MCG tablet TAKE ONE (1) TABLET BY MOUTH EVERY DAY   loratadine (CLARITIN) 10 MG tablet TAKE ONE (1) TABLET EACH DAY   MAGNESIUM CITRATE PO Take 500 mg by mouth daily.   metroNIDAZOLE (METROGEL) 0.75 % gel APPLY TWICE DAILY   Multiple Vitamins-Minerals (CENTRUM SILVER) CHEW  Chew 1 tablet by mouth daily.   mupirocin ointment (BACTROBAN) 2 % APPLY TWICE DAILY   omeprazole (PRILOSEC) 40 MG capsule Take 1 capsule (40 mg total) by mouth daily.   ondansetron (ZOFRAN-ODT) 4 MG disintegrating tablet TAKE 1 TABLET EVERY 8 HOURS AS NEEDED FOR NAUSEA AND VOMITING (Patient taking differently: 4 mg daily as needed for nausea.)   Polyethyl Glycol-Propyl Glycol (SYSTANE OP) Place 1 drop into both eyes daily as needed (dry eyes).   Probiotic Product (PROBIOTIC PO) Take 1 capsule by mouth daily.    sertraline (ZOLOFT)  100 MG tablet Take 2 tablets (200 mg total) by mouth daily. (Patient taking differently: Take 100 mg by mouth daily.)   sucralfate (CARAFATE) 1 g tablet TAKE ONE TABLET THREE TIMES A DAY WITH MEALS   Turmeric Curcumin 500 MG CAPS Take 500 mg by mouth daily.   No facility-administered encounter medications on file as of 10/08/2021.    Allergies (verified) Augmentin [amoxicillin-pot clavulanate], Famotidine, Klonopin [clonazepam], Other, Nitrofurantoin, Reglan [metoclopramide], and Sulfamethoxazole-trimethoprim   History: Past Medical History:  Diagnosis Date   Anemia    iron    Anxiety    Arthritis    COVID    2020 bad cold s/s lasted 1 week. 04/28/2021   Depression    Diverticulitis    Hx: of   GERD (gastroesophageal reflux disease)    at times for 15 yrs 04/28/2021   History of kidney stones    10 years ago   Hypothyroidism    Low iron    Pneumonia    2020   Rosacea    Hx: of   SCC (squamous cell carcinoma) 08/13/2021   in situ-left temple (CX35FU)   SCC (squamous cell carcinoma) 08/13/2021   well diff-right forehead (CX35FU)   Squamous cell carcinoma of skin 08/03/2021   in situ- left malar cheek (CX35FU)   Past Surgical History:  Procedure Laterality Date   APPENDECTOMY     BALLOON DILATION  06/16/2011   Procedure: BALLOON DILATION;  Surgeon: Jeryl Columbia, MD;  Location: [facility] ENDOSCOPY;  Service: Endoscopy;  Laterality: N/A;   BALLOON DILATION N/A 10/03/2013   Procedure: BALLOON DILATION;  Surgeon: Jeryl Columbia, MD;  Location: [facility] ENDOSCOPY;  Service: Endoscopy;  Laterality: N/A;   BALLOON DILATION N/A 08/10/2014   Procedure: BALLOON DILATION;  Surgeon: Jeryl Columbia, MD;  Location: [facility] ENDOSCOPY;  Service: Endoscopy;  Laterality: N/A;  from 12 - 15 cm dilation completed   BALLOON DILATION N/A 02/15/2015   Procedure: BALLOON DILATION;  Surgeon: Clarene Essex, MD;  Location: [facility] ENDOSCOPY;  Service: Endoscopy;  Laterality: N/A;   BALLOON DILATION N/A 11/12/2015   Procedure:  BALLOON DILATION;  Surgeon: Clarene Essex, MD;  Location: [facility]OSCOPY;  Service: Endoscopy;  Laterality: N/A;   BALLOON DILATION N/A 08/10/2016   Procedure: BALLOON DILATION;  Surgeon: Clarene Essex, MD;  Location: [facility] ENDOSCOPY;  Service: Endoscopy;  Laterality: N/A;   BALLOON DILATION N/A 08/07/2016   Procedure: BALLOON DILATION;  Surgeon: Clarene Essex, MD;  Location: [facility] ENDOSCOPY;  Service: Endoscopy;  Laterality: N/A;   BALLOON DILATION N/A 05/20/2017   Procedure: BALLOON DILATION;  Surgeon: Clarene Essex, MD;  Location: [facility]COPY;  Service: Endoscopy;  Laterality: N/A;   BALLOON DILATION N/A 12/02/2017   Procedure: BALLOON DILATION;  Surgeon: Clarene Essex, MD;  Location: [place];  Service: Endoscopy;  Laterality: N/A;   BALLOON DILATION N/A 07/19/2018   Procedure: BALLOON DILATION;  Surgeon: Clarene Essex, MD;  Location: [facility]COPY;  Service: Endoscopy;  Laterality: N/A;   BALLOON DILATION N/A 01/23/2019   Procedure: BALLOON DILATION;  Surgeon: Clarene Essex, MD;  Location: [facility];  Service: Endoscopy;  Laterality: N/A;   BALLOON DILATION N/A 07/13/2019   Procedure: BALLOON DILATION;  Surgeon: Clarene Essex, MD;  Location: [facility];  Service: Endoscopy;  Laterality: N/A;   BALLOON DILATION N/A 02/02/2020   Procedure: BALLOON DILATION;  Surgeon: Clarene Essex, MD;  Location: [facility];  Service: Endoscopy;  Laterality: N/A;   BALLOON DILATION N/A 07/16/2020   Procedure: BALLOON DILATION;  Surgeon: Clarene Essex, MD;  Location: [facility];  Service: Endoscopy;  Laterality: N/A;   BALLOON DILATION N/A 02/19/2021   Procedure: BALLOON DILATION;  Surgeon: Clarene Essex, MD;  Location: [facility];  Service: Endoscopy;  Laterality: N/A;   BALLOON DILATION N/A 09/30/2021   Procedure: BALLOON DILATION;  Surgeon: Clarene Essex, MD;  Location: [facility];  Service: Endoscopy;  Laterality: N/A;   CATARACT EXTRACTION, BILATERAL     2019   CHOLECYSTECTOMY OPEN  1979   COLONOSCOPY     Hx: of    CYSTOSCOPY/URETEROSCOPY/HOLMIUM LASER/STENT PLACEMENT Bilateral 05/06/2021   Procedure: CYSTOSCOPY BILATERAL RETROGRADE LEFT URETEROSCOPY/HOLMIUM LASER/STENT PLACEMENT;  Surgeon: [name], MD;  Location: [facility];  Service: Urology;  Laterality: Bilateral;   ESOPHAGOGASTRODUODENOSCOPY  06/16/2011   Procedure: ESOPHAGOGASTRODUODENOSCOPY (EGD);  Surgeon: Jeryl Columbia, MD;  Location: [facility] ENDOSCOPY;  Service: Endoscopy;  Laterality: N/A;   ESOPHAGOGASTRODUODENOSCOPY N/A 10/03/2013   Procedure: ESOPHAGOGASTRODUODENOSCOPY (EGD);  Surgeon: Jeryl Columbia, MD;  Location: [facility];  Service: Endoscopy;  Laterality: N/A;   ESOPHAGOGASTRODUODENOSCOPY N/A 08/10/2014   Procedure: ESOPHAGOGASTRODUODENOSCOPY (EGD);  Surgeon: Jeryl Columbia, MD;  Location: [facility] ENDOSCOPY;  Service: Endoscopy;  Laterality: N/A;   ESOPHAGOGASTRODUODENOSCOPY N/A 08/10/2016   Procedure: ESOPHAGOGASTRODUODENOSCOPY (EGD);  Surgeon: Clarene Essex, MD;  Location: [facility] ENDOSCOPY;  Service: Endoscopy;  Laterality: N/A;   ESOPHAGOGASTRODUODENOSCOPY N/A 07/19/2018   Procedure: ESOPHAGOGASTRODUODENOSCOPY (EGD);  Surgeon: Clarene Essex, MD;  Location: [facility] ENDOSCOPY;  Service: Endoscopy;  Laterality: N/A;   ESOPHAGOGASTRODUODENOSCOPY (EGD) WITH PROPOFOL N/A 02/15/2015   Procedure: ESOPHAGOGASTRODUODENOSCOPY (EGD) WITH PROPOFOL;  Surgeon: Clarene Essex, MD;  [facility];  Service: Endoscopy;  Laterality: N/A;   ESOPHAGOGASTRODUODENOSCOPY (EGD) WITH PROPOFOL N/A 11/12/2015   Procedure: ESOPHAGOGASTRODUODENOSCOPY (EGD) WITH PROPOFOL;  Surgeon: Clarene Essex, MD;  Location: [facility];  Service: Endoscopy;  Laterality: N/A;   ESOPHAGOGASTRODUODENOSCOPY (EGD) WITH PROPOFOL N/A 08/07/2016   Procedure: ESOPHAGOGASTRODUODENOSCOPY (EGD) WITH PROPOFOL;  Surgeon: Clarene Essex, MD;  Location: [facility] ENDOSCOPY;  Service: Endoscopy;  Laterality: N/A;   ESOPHAGOGASTRODUODENOSCOPY (EGD) WITH PROPOFOL N/A 05/20/2017   Procedure:  ESOPHAGOGASTRODUODENOSCOPY (EGD) WITH PROPOFOL;  Surgeon: Clarene Essex, MD;  [facility];  Service: Endoscopy;  Laterality: N/A;   ESOPHAGOGASTRODUODENOSCOPY (EGD) WITH PROPOFOL N/A 12/02/2017   Procedure: ESOPHAGOGASTRODUODENOSCOPY (EGD) WITH PROPOFOL;  Surgeon: Clarene Essex, MD;  Location: [place];  Service: Endoscopy;  Laterality: N/A;  have c arm available   ESOPHAGOGASTRODUODENOSCOPY (EGD) WITH PROPOFOL N/A 01/23/2019   Procedure: ESOPHAGOGASTRODUODENOSCOPY (EGD) WITH PROPOFOL;  Surgeon: Clarene Essex, MD;  [facility];  Service: Endoscopy;  Laterality: N/A;   ESOPHAGOGASTRODUODENOSCOPY (EGD) WITH PROPOFOL N/A 07/13/2019   Procedure: ESOPHAGOGASTRODUODENOSCOPY (EGD) WITH PROPOFOL;  Surgeon: Clarene Essex, MD;  [facility];  Service: Endoscopy;  Laterality: N/A;   ESOPHAGOGASTRODUODENOSCOPY (EGD) WITH PROPOFOL N/A 02/02/2020   Procedure: ESOPHAGOGASTRODUODENOSCOPY (EGD) WITH PROPOFOL;  Surgeon: Clarene Essex, MD;  [facility];  Service: Endoscopy;  Laterality: N/A;   ESOPHAGOGASTRODUODENOSCOPY (EGD) WITH  PROPOFOL N/A 07/16/2020   Procedure: ESOPHAGOGASTRODUODENOSCOPY (EGD) WITH PROPOFOL;  Surgeon: Clarene Essex, MD;  [facility];  Service: Endoscopy;  Laterality: N/A;   ESOPHAGOGASTRODUODENOSCOPY (EGD) WITH PROPOFOL N/A 02/19/2021   Procedure: ESOPHAGOGASTRODUODENOSCOPY (EGD) WITH PROPOFOL;  Surgeon: Clarene Essex, MD;  [facility];  Service: Endoscopy;  Laterality: N/A;  with possible botox   ESOPHAGOGASTRODUODENOSCOPY (EGD) WITH PROPOFOL N/A 09/30/2021   Procedure: ESOPHAGOGASTRODUODENOSCOPY (EGD) WITH PROPOFOL;  Surgeon: Clarene Essex, MD;  [facility];  Service: Endoscopy;  Laterality: N/A;   FOREIGN BODY REMOVAL  07/19/2018   Procedure: FOREIGN BODY REMOVAL;  Surgeon: Clarene Essex, MD;  Location: [facility] ENDOS[facility];  Service: Endoscopy;;   FRACTURE SURGERY     Hx: of left wrist 01/2021   GASTRECTOMY  [id number]   GASTRECTOMY  N/A    X3   LITHOTRIPSY     Hx; of for kidney stones 23007   SAVORY DILATION N/A 08/10/2014   Procedure: SAVORY DILATION;  Surgeon: Jeryl Columbia, MD;  Location: [facility];  Service: Endoscopy;  Laterality: N/A;   TOTAL HIP ARTHROPLASTY Right 12/30/2017   Procedure: RIGHT TOTAL HIP ARTHROPLASTY;  Surgeon: Carole Civil, MD;  Location: [facility];  Service: Orthopedics;  Laterality: Right;   TOTAL HIP ARTHROPLASTY Left 02/17/2018   Procedure: TOTAL HIP ARTHROPLASTY;  Surgeon: Carole Civil, MD;  Location: [facility];  Service: Orthopedics;  Laterality: Left;   TUBAL LIGATION     1975   Family History  Problem Relation Age of Onset   Diabetes Mother    Cancer - Prostate Brother    Social History   Socioeconomic History   Marital status: Widowed    Spouse name: Not on file   Number of children: 1   Years of education: Not on file   Highest education level: Not on file  Occupational History   Not on file  Tobacco Use   Smoking status: Never   Smokeless tobacco: Never  Vaping Use   Vaping Use: Never used  Substance and Sexual Activity   Alcohol use: No   Drug use: No   Sexual activity: Not Currently  Other Topics Concern   Not on file  Social History Narrative   1 son-[name], lives next door.   Widowed since 2002   Social Determinants of Health   Financial Resource Strain: Low Risk    Difficulty of Paying Living Expenses: Not hard at all  Food Insecurity: No Food Insecurity   Worried About Charity fundraiser in the Last Year: Never true   Arboriculturist in the Last Year: Never true  Transportation Needs: No Transportation Needs   Lack of Transportation (Medical): No   Lack of Transportation (Non-Medical): No  Physical Activity: Sufficiently Active   Days of Exercise per Week: 5 days   Minutes of Exercise per Session: 30 min  Stress: No Stress Concern Present   Feeling of Stress : Not at all  Social Connections: Moderately Integrated   Frequency of  Communication with Friends and Family: More than three times a week   Frequency of Social Gatherings with Friends and Family: More than three times a week   Attends Religious Services: More than 4 times per year   Active Member of [employer] or Organizations: Yes   Attends Archivist Meetings: More than 4 times per year   Marital Status: Widowed    Tobacco Counseling Counseling given: Not Answered   Clinical Intake:  Pre-visit preparation completed: Yes  Pain :  No/denies pain Pain Score: 0-No pain     BMI - recorded: 17.31 Nutritional Status: BMI <19  Underweight Nutritional Risks: None Diabetes: No  How often do you need to have someone help you when you read instructions, pamphlets, or other written materials from your doctor or pharmacy?: 1 - Never  Diabetic?No  Interpreter Needed?: No  Information entered by :: mj Delenn Ahn, lpn   Activities of Daily Living In your present state of health, do you have any difficulty performing the following activities: 10/08/2021 05/06/2021  Hearing? N N  Vision? N N  Difficulty concentrating or making decisions? N N  Walking or climbing stairs? N N  Dressing or bathing? N N  Doing errands, shopping? N -  Preparing Food and eating ? N -  Using the Toilet? N -  In the past six months, have you accidently leaked urine? N -  Do you have problems with loss of bowel control? N -  Managing your Medications? N -  Managing your Finances? N -  Housekeeping or managing your Housekeeping? N -  Some recent data might be hidden    Patient Care Team: Chevis Pretty, FNP as PCP - General (Family Medicine) Carole Civil, MD as Consulting Physician (Orthopedic Surgery) Warren Danes, PA-C as Physician Assistant (Dermatology)  Indicate any recent Medical Services you may have received from other than [facility] providers in the past year (date may be approximate).     Assessment:   This is a routine wellness examination for  Brittany Hensley.  Hearing/Vision screen Hearing Screening - Comments:: No hearing issues.  Vision Screening - Comments:: Cataract surgery, no glasses. [facility] [place] 2022.  Dietary issues and exercise activities discussed: Current Exercise Habits: Home exercise routine, Type of exercise: walking;strength training/weights, Time (Minutes): 30, Frequency (Times/Week): 5, Weekly Exercise (Minutes/Week): 150, Intensity: Mild, Exercise limited by: orthopedic condition(s);cardiac condition(s)   Goals Addressed             This Visit's Progress    Have 3 meals a day       Continue to eat healthy and move.        Depression Screen PHQ 2/9 Scores 10/08/2021 07/24/2021 05/22/2021 11/01/2020 05/03/2020 12/01/2019 01/06/2019  PHQ - 2 Score 0 0 0 0 0 0 0  PHQ- 9 Score - 0 - - - - -    Fall Risk Fall Risk  10/08/2021 07/24/2021 05/22/2021 11/01/2020 05/03/2020  Falls in the past year? 0 0 0 0 0  Number falls in past yr: 0 - - - 0  Injury with Fall? 0 - - - 0  Risk for fall due to : Impaired balance/gait - - - No Fall Risks  Follow up Falls prevention discussed - - - Falls evaluation completed    FALL RISK PREVENTION PERTAINING TO THE HOME:  Any stairs in or around the home? Yes  If so, are there any without handrails? No  Home free of loose throw rugs in walkways, pet beds, electrical cords, etc? Yes  Adequate lighting in your home to reduce risk of falls? Yes   ASSISTIVE DEVICES UTILIZED TO PREVENT FALLS:  Life alert? No  Use of a cane, walker or w/c? No  Grab bars in the bathroom? Yes  Shower chair or bench in shower? Yes  Elevated toilet seat or a handicapped toilet? No   TIMED UP AND GO:  Was the test performed? No .  Phone visit.  Cognitive Function:     6CIT Screen 10/08/2021  What  Year? 0 points  What month? 0 points  What time? 0 points  Count back from 20 0 points  Months in reverse 0 points  Repeat phrase 2 points  Total Score 2    Immunizations Immunization History   Administered Date(s) Administered   PFIZER(Purple Top)SARS-COV-2 Vaccination 03/14/2020, 04/04/2020   Pneumococcal Conjugate-13 06/04/2017   Pneumococcal Polysaccharide-23 05/03/2020   Tdap 06/08/2013, 01/03/2021    TDAP status: Up to date  Flu Vaccine status: Declined, Education has been provided regarding the importance of this vaccine but patient still declined. Advised may receive this vaccine at local pharmacy or Health Dept. Aware to provide a copy of the vaccination record if obtained from local pharmacy or Health Dept. Verbalized acceptance and understanding.  Pneumococcal vaccine status: Up to date  Covid-19 vaccine status: Completed vaccines  Qualifies for Shingles Vaccine? Yes   Zostavax completed No   Shingrix Completed?: No.    Education has been provided regarding the importance of this vaccine. Patient has been advised to call insurance company to determine out of pocket expense if they have not yet received this vaccine. Advised may also receive vaccine at local pharmacy or Health Dept. Verbalized acceptance and understanding.  Screening Tests Health Maintenance  Topic Date Due   COVID-19 Vaccine (3 - Pfizer risk series) 05/02/2020   INFLUENZA VACCINE  10/31/2021 (Originally 03/03/2021)   MAMMOGRAM  05/22/2022 (Originally 04/24/2014)   Zoster Vaccines- Shingrix (1 of 2) 10/12/2022 (Originally 06/04/1970)   DEXA SCAN  05/03/2022   COLONOSCOPY (Pts 45-80yr Insurance coverage will need to be confirmed)  02/01/2023   TETANUS/TDAP  01/04/2031   Pneumonia Vaccine 71 Years old  Completed   Hepatitis C Screening  Completed   HPV VACCINES  Aged Out   COLON CANCER SCREENING ANNUAL FOBT  Discontinued    Health Maintenance  Health Maintenance Due  Topic Date Due   COVID-19 Vaccine (3 - PAvocarisk series) 05/02/2020    Colorectal cancer screening: Type of screening: Colonoscopy. Completed 01/31/2013. Repeat every 10 years  Mammogram status: Completed 04/25/2011. Repeat  every year  Bone Density status: Completed 05/03/2020. Results reflect: Bone density results: OSTEOPOROSIS. Repeat every 2 years.  Lung Cancer Screening: (Low Dose CT Chest recommended if Age 89100-80years, 30 pack-year currently smoking OR have quit w/in 15years.) does not qualify.   Additional Screening:  Hepatitis C Screening: does qualify; Completed 01/10/2016  Vision Screening: Recommended annual ophthalmology exams for early detection of glaucoma and other disorders of the eye. Is the patient up to date with their annual eye exam?  Yes  Who is the provider or what is the name of the office in which the patient attends annual eye exams? [facility] [place] If pt is not established with a provider, would they like to be referred to a provider to establish care? No .   Dental Screening: Recommended annual dental exams for proper oral hygiene  Community Resource Referral / Chronic Care Management: CRR required this visit?  No   CCM required this visit?  No      Plan:     I have personally reviewed and noted the following in the patients chart:   Medical and social history Use of alcohol, tobacco or illicit drugs  Current medications and supplements including opioid prescriptions. Patient is not currently taking opioid prescriptions. Functional ability and status Nutritional status Physical activity Advanced directives List of other physicians Hospitalizations, surgeries, and ER visits in previous 12 months Vitals Screenings to include cognitive, depression, and falls  Referrals and appointments  In addition, I have reviewed and discussed with patient certain preventive protocols, quality metrics, and best practice recommendations. A written personalized care plan for preventive services as well as general preventive health recommendations were provided to patient.     Chriss Driver, LPN   0/04/3234   Nurse Notes: Discussed mammogram. Pt declined repeat at this time. Pt  states she does do monthly self breast exams. Discussed Shingrix and how to obtain.

## 2021-10-08 NOTE — Patient Instructions (Signed)
[name] , Thank you for taking time to come for your Medicare Wellness Visit. I appreciate your ongoing commitment to your health goals. Please review the following plan we discussed and let me know if I can assist you in the future.   Screening recommendations/referrals: Colonoscopy: Done 01/31/2013 Repeat in 10 years  Mammogram: Done 04/24/2012. Repeat annually  Bone Density: Done 05/03/2020 Repeat every 2 years  Recommended yearly ophthalmology/optometry visit for glaucoma screening and checkup Recommended yearly dental visit for hygiene and checkup  Vaccinations: Influenza vaccine: Declined.  Pneumococcal vaccine: Done 06/04/2017 and 05/03/2020 Tdap vaccine: Done 01/03/2021 Shingles vaccine: Discussed.   Covid-19:Done 03/14/2020 and 04/04/2020  Advanced directives: Advance directive discussed with you today. Even though you declined this today, please call our office should you change your mind, and we can give you the proper paperwork for you to fill out.   Conditions/risks identified: KEEP UP THE GOOD WORK!!  Next appointment: Follow up in one year for your annual wellness visit 2024   Preventive Care 65 Years and Older, Female Preventive care refers to lifestyle choices and visits with your health care provider that can promote health and wellness. What does preventive care include? A yearly physical exam. This is also called an annual well check. Dental exams once or twice a year. Routine eye exams. Ask your health care provider how often you should have your eyes checked. Personal lifestyle choices, including: Daily care of your teeth and gums. Regular physical activity. Eating a healthy diet. Avoiding tobacco and drug use. Limiting alcohol use. Practicing safe sex. Taking low-dose aspirin every day. Taking vitamin and mineral supplements as recommended by your health care provider. What happens during an annual well check? The services and screenings done by your health  care provider during your annual well check will depend on your age, overall health, lifestyle risk factors, and family history of disease. Counseling  Your health care provider may ask you questions about your: Alcohol use. Tobacco use. Drug use. Emotional well-being. Home and relationship well-being. Sexual activity. Eating habits. History of falls. Memory and ability to understand (cognition). Work and work Statistician. Reproductive health. Screening  You may have the following tests or measurements: Height, weight, and BMI. Blood pressure. Lipid and cholesterol levels. These may be checked every 5 years, or more frequently if you are over 61 years old. Skin check. Lung cancer screening. You may have this screening every year starting at age 52 if you have a 30-pack-year history of smoking and currently smoke or have quit within the past 15 years. Fecal occult blood test (FOBT) of the stool. You may have this test every year starting at age 54. Flexible sigmoidoscopy or colonoscopy. You may have a sigmoidoscopy every 5 years or a colonoscopy every 10 years starting at age 36. Hepatitis C blood test. Hepatitis B blood test. Sexually transmitted disease (STD) testing. Diabetes screening. This is done by checking your blood sugar (glucose) after you have not eaten for a while (fasting). You may have this done every 1-3 years. Bone density scan. This is done to screen for osteoporosis. You may have this done starting at age 64. Mammogram. This may be done every 1-2 years. Talk to your health care provider about how often you should have regular mammograms. Talk with your health care provider about your test results, treatment options, and if necessary, the need for more tests. Vaccines  Your health care provider may recommend certain vaccines, such as: Influenza vaccine. This is recommended every  year. Tetanus, diphtheria, and acellular pertussis (Tdap, Td) vaccine. You may need a Td  booster every 10 years. Zoster vaccine. You may need this after age 25. Pneumococcal 13-valent conjugate (PCV13) vaccine. One dose is recommended after age 102. Pneumococcal polysaccharide (PPSV23) vaccine. One dose is recommended after age 67. Talk to your health care provider about which screenings and vaccines you need and how often you need them. This information is not intended to replace advice given to you by your health care provider. Make sure you discuss any questions you have with your health care provider. Document Released: 08/16/2015 Document Revised: 04/08/2016 Document Reviewed: 05/21/2015 Elsevier Interactive Patient Education  2017 [place] Prevention in the Home Falls can cause injuries. They can happen to people of all ages. There are many things you can do to make your home safe and to help prevent falls. What can I do on the outside of my home? Regularly fix the edges of walkways and driveways and fix any cracks. Remove anything that might make you trip as you walk through a door, such as a raised step or threshold. Trim any bushes or trees on the path to your home. Use bright outdoor lighting. Clear any walking paths of anything that might make someone trip, such as rocks or tools. Regularly check to see if handrails are loose or broken. Make sure that both sides of any steps have handrails. Any raised decks and porches should have guardrails on the edges. Have any leaves, snow, or ice cleared regularly. Use sand or salt on walking paths during winter. Clean up any spills in your garage right away. This includes oil or grease spills. What can I do in the bathroom? Use night lights. Install grab bars by the toilet and in the tub and shower. Do not use towel bars as grab bars. Use non-skid mats or decals in the tub or shower. If you need to sit down in the shower, use a plastic, non-slip stool. Keep the floor dry. Clean up any water that spills on the floor  as soon as it happens. Remove soap buildup in the tub or shower regularly. Attach bath mats securely with double-sided non-slip rug tape. Do not have throw rugs and other things on the floor that can make you trip. What can I do in the bedroom? Use night lights. Make sure that you have a light by your bed that is easy to reach. Do not use any sheets or blankets that are too big for your bed. They should not hang down onto the floor. Have a firm chair that has side arms. You can use this for support while you get dressed. Do not have throw rugs and other things on the floor that can make you trip. What can I do in the kitchen? Clean up any spills right away. Avoid walking on wet floors. Keep items that you use a lot in easy-to-reach places. If you need to reach something above you, use a strong step stool that has a grab bar. Keep electrical cords out of the way. Do not use floor polish or wax that makes floors slippery. If you must use wax, use non-skid floor wax. Do not have throw rugs and other things on the floor that can make you trip. What can I do with my stairs? Do not leave any items on the stairs. Make sure that there are handrails on both sides of the stairs and use them. Fix handrails that are broken or  loose. Make sure that handrails are as long as the stairways. Check any carpeting to make sure that it is firmly attached to the stairs. Fix any carpet that is loose or worn. Avoid having throw rugs at the top or bottom of the stairs. If you do have throw rugs, attach them to the floor with carpet tape. Make sure that you have a light switch at the top of the stairs and the bottom of the stairs. If you do not have them, ask someone to add them for you. What else can I do to help prevent falls? Wear shoes that: Do not have high heels. Have rubber bottoms. Are comfortable and fit you well. Are closed at the toe. Do not wear sandals. If you use a stepladder: Make sure that it is  fully opened. Do not climb a closed stepladder. Make sure that both sides of the stepladder are locked into place. Ask someone to hold it for you, if possible. Clearly mark and make sure that you can see: Any grab bars or handrails. First and last steps. Where the edge of each step is. Use tools that help you move around (mobility aids) if they are needed. These include: Canes. Walkers. Scooters. Crutches. Turn on the lights when you go into a dark area. Replace any light bulbs as soon as they burn out. Set up your furniture so you have a clear path. Avoid moving your furniture around. If any of your floors are uneven, fix them. If there are any pets around you, be aware of where they are. Review your medicines with your doctor. Some medicines can make you feel dizzy. This can increase your chance of falling. Ask your doctor what other things that you can do to help prevent falls. This information is not intended to replace advice given to you by your health care provider. Make sure you discuss any questions you have with your health care provider. Document Released: 05/16/2009 Document Revised: 12/26/2015 Document Reviewed: 08/24/2014 Elsevier Interactive Patient Education  2017 [employer].

## 2021-10-14 ENCOUNTER — Other Ambulatory Visit: Payer: Self-pay

## 2021-10-14 ENCOUNTER — Inpatient Hospital Stay: Payer: PPO | Attending: Oncology

## 2021-10-14 DIAGNOSIS — D500 Iron deficiency anemia secondary to blood loss (chronic): Secondary | ICD-10-CM | POA: Diagnosis not present

## 2021-10-14 LAB — CBC WITH DIFFERENTIAL (CANCER CENTER ONLY)
Abs Immature Granulocytes: 0.01 10*3/uL (ref 0.00–0.07)
Basophils Absolute: 0 10*3/uL (ref 0.0–0.1)
Basophils Relative: 1 %
Eosinophils Absolute: 0.2 10*3/uL (ref 0.0–0.5)
Eosinophils Relative: 3 %
HCT: 40.4 % (ref 36.0–46.0)
Hemoglobin: 13 g/dL (ref 12.0–15.0)
Immature Granulocytes: 0 %
Lymphocytes Relative: 37 %
Lymphs Abs: 1.9 10*3/uL (ref 0.7–4.0)
MCH: 32.3 pg (ref 26.0–34.0)
MCHC: 32.2 g/dL (ref 30.0–36.0)
MCV: 100.5 fL — ABNORMAL HIGH (ref 80.0–100.0)
Monocytes Absolute: 0.7 10*3/uL (ref 0.1–1.0)
Monocytes Relative: 14 %
Neutro Abs: 2.3 10*3/uL (ref 1.7–7.7)
Neutrophils Relative %: 45 %
Platelet Count: 228 10*3/uL (ref 150–400)
RBC: 4.02 MIL/uL (ref 3.87–5.11)
RDW: 12.6 % (ref 11.5–15.5)
WBC Count: 5.1 10*3/uL (ref 4.0–10.5)
nRBC: 0 % (ref 0.0–0.2)

## 2021-10-14 LAB — FERRITIN: Ferritin: 234 ng/mL (ref 11–307)

## 2021-10-15 ENCOUNTER — Telehealth: Payer: Self-pay

## 2021-10-15 NOTE — Telephone Encounter (Signed)
-----   Message from Owens Shark, NP sent at 10/15/2021 10:04 AM EDT ----- ?Please let her know labs look good, follow-up as scheduled ? ?

## 2021-10-15 NOTE — Telephone Encounter (Signed)
Called and spoke with the patient to let her know labs look good, follow-up as scheduled. Patient request a copy of lab, copy was mailed. Patient gave verbal understanding and denied further questions ?

## 2021-10-21 ENCOUNTER — Other Ambulatory Visit: Payer: Self-pay | Admitting: Nurse Practitioner

## 2021-10-21 DIAGNOSIS — D500 Iron deficiency anemia secondary to blood loss (chronic): Secondary | ICD-10-CM

## 2021-10-23 ENCOUNTER — Encounter: Payer: Self-pay | Admitting: Urology

## 2021-10-23 ENCOUNTER — Ambulatory Visit (INDEPENDENT_AMBULATORY_CARE_PROVIDER_SITE_OTHER): Payer: PPO | Admitting: Urology

## 2021-10-23 ENCOUNTER — Other Ambulatory Visit: Payer: Self-pay

## 2021-10-23 VITALS — BP 114/61 | HR 88 | Ht 65.0 in | Wt 104.0 lb

## 2021-10-23 DIAGNOSIS — N200 Calculus of kidney: Secondary | ICD-10-CM | POA: Diagnosis not present

## 2021-10-23 DIAGNOSIS — N390 Urinary tract infection, site not specified: Secondary | ICD-10-CM

## 2021-10-23 LAB — MICROSCOPIC EXAMINATION
Epithelial Cells (non renal): NONE SEEN /hpf (ref 0–10)
Renal Epithel, UA: NONE SEEN /hpf

## 2021-10-23 LAB — URINALYSIS, ROUTINE W REFLEX MICROSCOPIC
Bilirubin, UA: NEGATIVE
Glucose, UA: NEGATIVE
Ketones, UA: NEGATIVE
Nitrite, UA: NEGATIVE
Specific Gravity, UA: 1.02 (ref 1.005–1.030)
Urobilinogen, Ur: 0.2 mg/dL (ref 0.2–1.0)
pH, UA: 6 (ref 5.0–7.5)

## 2021-10-23 NOTE — Progress Notes (Signed)
?Subjective: ?1. Kidney stones   ?2. Urinary tract infection without hematuria, site unspecified   ?  ?Brittany Hensley is a former patient with a history of stones who I last saw in 2009.  She was in the ER on 04/14/21 with gross hematuria and a CT shows bilateral non-obstructing renal stones with a malrotated right kidney.   There is a partial staghorn stone in the LLP that has multiple small stones making it up.  The stones are larger on the right and distributed throughout the kidney.  The bladder is not well seen because of beam hardening from bilateral hip implants.  She has no other voiding complaints.  Her UA today has >30 RBC with WBC and bacteria as well.  ? ?06/12/21: Brittany Hensley returns today in f/u from cystoscopy with bil RTG, left URS with HLL and stent on 05/06/21.   She had multiple LLP stones that were felt to be the most likely cause of the gross hematuria.  She remains on doxycycline for suppression of an enterococcal UTI.  Her UA today shows 6-10 WBC, >30 RBC's and mod bact.   ? ?10/23/21: Brittany Hensley returns today in f/u.  She is doing well without flank pain or hematuria.  She has at most mild dysuria.  Her UA today has 11-30 WBC with 3-10 RBC and many bacteria.   She had a KUB that showed stable right renal stones and left renal stones were not clearly seen. The renal US in 11/22 showed bilateral  renal stones without obstruction but the left renal shadows could be artifact as no stones were noted on KUB.  She missed her last appt with COVID.  She has had no further hematuria since her ureteroscopy in 10/22.    ?ROS: ? ?ROS ? ?Allergies  ?Allergen Reactions  ? Augmentin [Amoxicillin-Pot Clavulanate] Other (See Comments)  ?  Bloody stool ?Did it involve swelling of the face/tongue/throat, SOB, or low BP? No ?Did it involve sudden or severe rash/hives, skin peeling, or any reaction on the inside of your mouth or nose? No ?Did you need to seek medical attention at a hospital or doctor's office? No ?When did it last  happen?      1 year ?If all above answers are ?NO?, may proceed with cephalosporin use. ?  ? Famotidine Other (See Comments)  ?  Fever ?  ? Klonopin [Clonazepam] Other (See Comments)  ?  double vision  ? Other Other (See Comments)  ? Nitrofurantoin Rash  ? Reglan [Metoclopramide] Anxiety and Other (See Comments)  ?  Causes confusion  ? Sulfamethoxazole-Trimethoprim Rash  ? ? ?Past Medical History:  ?Diagnosis Date  ? Anemia   ? iron   ? Anxiety   ? Arthritis   ? COVID   ? 2020 bad cold s/s lasted 1 week. 04/28/2021  ? Depression   ? Diverticulitis   ? Hx: of  ? GERD (gastroesophageal reflux disease)   ? at times for 15 yrs 04/28/2021  ? History of kidney stones   ? 10 years ago  ? Hypothyroidism   ? Low iron   ? Pneumonia   ? 2020  ? Rosacea   ? Hx: of  ? SCC (squamous cell carcinoma) 08/13/2021  ? in situ-left temple (CX35FU)  ? SCC (squamous cell carcinoma) 08/13/2021  ? well diff-right forehead (CX35FU)  ? Squamous cell carcinoma of skin 08/03/2021  ? in situ- left malar cheek (CX35FU)  ? ? ?Past Surgical History:  ?Procedure Laterality Date  ? APPENDECTOMY    ?  BALLOON DILATION  06/16/2011  ? Procedure: BALLOON DILATION;  Surgeon: Jeryl Columbia, MD;  Location: [facility] ENDOSCOPY;  Service: Endoscopy;  Laterality: N/A;  ? BALLOON DILATION N/A 10/03/2013  ? Procedure: BALLOON DILATION;  Surgeon: Jeryl Columbia, MD;  Location: [facility] ENDOSCOPY;  Service: Endoscopy;  Laterality: N/A;  ? BALLOON DILATION N/A 08/10/2014  ? Procedure: BALLOON DILATION;  Surgeon: Jeryl Columbia, MD;  Location: [facility] ENDOSCOPY;  Service: Endoscopy;  Laterality: N/A;  from 12 - 15 cm dilation completed  ? BALLOON DILATION N/A 02/15/2015  ? Procedure: BALLOON DILATION;  Surgeon: Clarene Essex, MD;  Location: [facility] ENDOSCOPY;  Service: Endoscopy;  Laterality: N/A;  ? BALLOON DILATION N/A 11/12/2015  ? Procedure: BALLOON DILATION;  Surgeon: Clarene Essex, MD;  Location: [facility] ENDOSCOPY;  Service: Endoscopy;  Laterality: N/A;  ? BALLOON DILATION N/A 08/10/2016  ?  Procedure: BALLOON DILATION;  Surgeon: Clarene Essex, MD;  Location: [facility] ENDOSCOPY;  Service: Endoscopy;  Laterality: N/A;  ? BALLOON DILATION N/A 08/07/2016  ? Procedure: BALLOON DILATION;  Surgeon: Clarene Essex, MD;  Location: [facility] ENDOSCOPY;  Service: Endoscopy;  Laterality: N/A;  ? BALLOON DILATION N/A 05/20/2017  ? Procedure: BALLOON DILATION;  Surgeon: Clarene Essex, MD;  Location: [facility] ENDOSCOPY;  Service: Endoscopy;  Laterality: N/A;  ? BALLOON DILATION N/A 12/02/2017  ? Procedure: BALLOON DILATION;  Surgeon: Clarene Essex, MD;  Location: [facility] ENDOSCOPY;  Service: Endoscopy;  Laterality: N/A;  ? BALLOON DILATION N/A 07/19/2018  ? Procedure: BALLOON DILATION;  Surgeon: Clarene Essex, MD;  Location: [facility] ENDOSCOPY;  Service: Endoscopy;  Laterality: N/A;  ? BALLOON DILATION N/A 01/23/2019  ? Procedure: BALLOON DILATION;  Surgeon: Clarene Essex, MD;  Location: [facility] ENDOSCOPY;  Service: Endoscopy;  Laterality: N/A;  ? BALLOON DILATION N/A 07/13/2019  ? Procedure: BALLOON DILATION;  Surgeon: Clarene Essex, MD;  Location: [facility] ENDOSCOPY;  Service: Endoscopy;  Laterality: N/A;  ? BALLOON DILATION N/A 02/02/2020  ? Procedure: BALLOON DILATION;  Surgeon: Clarene Essex, MD;  Location: [facility] ENDOSCOPY;  Service: Endoscopy;  Laterality: N/A;  ? BALLOON DILATION N/A 07/16/2020  ? Procedure: BALLOON DILATION;  Surgeon: Clarene Essex, MD;  Location: [facility] ENDOSCOPY;  Service: Endoscopy;  Laterality: N/A;  ? BALLOON DILATION N/A 02/19/2021  ? Procedure: BALLOON DILATION;  Surgeon: Clarene Essex, MD;  Location: [facility] ENDOSCOPY;  Service: Endoscopy;  Laterality: N/A;  ? BALLOON DILATION N/A 09/30/2021  ? Procedure: BALLOON DILATION;  Surgeon: Clarene Essex, MD;  Location: [facility] ENDOSCOPY;  Service: Endoscopy;  Laterality: N/A;  ? CATARACT EXTRACTION, BILATERAL    ? 2019  ? CHOLECYSTECTOMY OPEN  1979  ? COLONOSCOPY    ? Hx: of  ? CYSTOSCOPY/URETEROSCOPY/HOLMIUM LASER/STENT PLACEMENT Bilateral 05/06/2021  ? Procedure: CYSTOSCOPY BILATERAL RETROGRADE LEFT URETEROSCOPY/HOLMIUM  LASER/STENT PLACEMENT;  Surgeon: [name], MD;  Location: [facility];  Service: Urology;  Laterality: Bilateral;  ? ESOPHAGOGASTRODUODENOSCOPY  06/16/2011  ? Procedure: ESOPHAGOGASTRODUODENOSCOPY (EGD);  Surgeon: Jeryl Columbia, MD;  Location: [facility] ENDOSCOPY;  Service: Endoscopy;  Laterality: N/A;  ? ESOPHAGOGASTRODUODENOSCOPY N/A 10/03/2013  ? Procedure: ESOPHAGOGASTRODUODENOSCOPY (EGD);  Surgeon: Jeryl Columbia, MD;  Location: [facility] ENDOSCOPY;  Service: Endoscopy;  Laterality: N/A;  ? ESOPHAGOGASTRODUODENOSCOPY N/A 08/10/2014  ? Procedure: ESOPHAGOGASTRODUODENOSCOPY (EGD);  Surgeon: Jeryl Columbia, MD;  Location: [facility] ENDOSCOPY;  Service: Endoscopy;  Laterality: N/A;  ? ESOPHAGOGASTRODUODENOSCOPY N/A 08/10/2016  ? Procedure: ESOPHAGOGASTRODUODENOSCOPY (EGD);  Surgeon: Clarene Essex, MD;  Location: [facility] ENDOSCOPY;  Service: Endoscopy;  Laterality: N/A;  ? ESOPHAGOGASTRODUODENOSCOPY N/A 07/19/2018  ? Procedure: ESOPHAGOGASTRODUODENOSCOPY (EGD);  Surgeon: Clarene Essex, MD;  Location: [facility] ENDOSCOPY;  Service: Endoscopy;  Laterality: N/A;  ? ESOPHAGOGASTRODUODENOSCOPY (EGD) WITH PROPOFOL N/A 02/15/2015  ? Procedure: ESOPHAGOGASTRODUODENOSCOPY (EGD) WITH PROPOFOL;  Surgeon: Clarene Essex, MD;  Location: [facility] ENDOSCOPY;  Service: Endoscopy;  Laterality: N/A;  ? ESOPHAGOGASTRODUODENOSCOPY (EGD) WITH PROPOFOL N/A 11/12/2015  ? Procedure: ESOPHAGOGASTRODUODENOSCOPY (EGD) WITH PROPOFOL;  Surgeon: Clarene Essex, MD;  Location: [facility] ENDOSCOPY;  Service: Endoscopy;  Laterality: N/A;  ? ESOPHAGOGASTRODUODENOSCOPY (EGD) WITH PROPOFOL N/A 08/07/2016  ? Procedure: ESOPHAGOGASTRODUODENOSCOPY (EGD) WITH PROPOFOL;  Surgeon: Clarene Essex, MD;  Location: [facility] ENDOSCOPY;  Service: Endoscopy;  Laterality: N/A;  ? ESOPHAGOGASTRODUODENOSCOPY (EGD) WITH PROPOFOL N/A 05/20/2017  ? Procedure: ESOPHAGOGASTRODUODENOSCOPY (EGD) WITH PROPOFOL;  Surgeon: Clarene Essex, MD;  Location: [facility] ENDOSCOPY;  Service: Endoscopy;  Laterality: N/A;  ? ESOPHAGOGASTRODUODENOSCOPY  (EGD) WITH PROPOFOL N/A 12/02/2017  ? Procedure: ESOPHAGOGASTRODUODENOSCOPY (EGD) WITH PROPOFOL;  Surgeon: Clarene Essex, MD;  Location: [place];  Service: Endoscopy;  Laterality: N/A;  have c arm available

## 2021-10-25 LAB — URINE CULTURE

## 2021-10-27 ENCOUNTER — Telehealth: Payer: Self-pay | Admitting: Physician Assistant

## 2021-10-27 ENCOUNTER — Telehealth: Payer: Self-pay | Admitting: *Deleted

## 2021-10-27 NOTE — Progress Notes (Signed)
Sent via mail 

## 2021-10-27 NOTE — Telephone Encounter (Signed)
Phone call from patient about her tolak treatment. She is got a few days left of her 2 week treatment and her right side of face is worse than the left. I told her it is probably because there is more sun damage on that side. If she is too irritated she can stop the treatment. She said she will try to finish the treatment and then stop.  ?

## 2021-11-04 ENCOUNTER — Ambulatory Visit (INDEPENDENT_AMBULATORY_CARE_PROVIDER_SITE_OTHER): Payer: PPO | Admitting: Dermatology

## 2021-11-04 ENCOUNTER — Encounter: Payer: Self-pay | Admitting: Dermatology

## 2021-11-04 DIAGNOSIS — L570 Actinic keratosis: Secondary | ICD-10-CM | POA: Diagnosis not present

## 2021-11-04 DIAGNOSIS — L089 Local infection of the skin and subcutaneous tissue, unspecified: Secondary | ICD-10-CM

## 2021-11-04 DIAGNOSIS — B9689 Other specified bacterial agents as the cause of diseases classified elsewhere: Secondary | ICD-10-CM

## 2021-11-04 MED ORDER — MUPIROCIN 2 % EX OINT: 1.0000 "application " | TOPICAL_OINTMENT | Freq: Two times a day (BID) | CUTANEOUS | 0 refills | Status: DC

## 2021-11-11 ENCOUNTER — Telehealth: Payer: Self-pay | Admitting: Dermatology

## 2021-11-11 LAB — ANAEROBIC AND AEROBIC CULTURE
AER RESULT:: NO GROWTH
GRAM STAIN:: NONE SEEN
MICRO NUMBER:: 13220046
MICRO NUMBER:: 13220047
SPECIMEN QUALITY:: ADEQUATE
SPECIMEN QUALITY:: ADEQUATE

## 2021-11-11 NOTE — Telephone Encounter (Signed)
Culture results

## 2021-11-11 NOTE — Telephone Encounter (Signed)
Patient aware pending ? ?

## 2021-11-23 ENCOUNTER — Encounter: Payer: Self-pay | Admitting: Dermatology

## 2021-11-23 ENCOUNTER — Other Ambulatory Visit: Payer: Self-pay | Admitting: Nurse Practitioner

## 2021-11-23 DIAGNOSIS — D500 Iron deficiency anemia secondary to blood loss (chronic): Secondary | ICD-10-CM

## 2021-11-23 NOTE — Progress Notes (Signed)
? ?  Follow-Up Visit ?  ?Subjective  ?Brittany Hensley is a 71 y.o. female who presents for the following: Follow-up (F/u for tolak. Pt states she finished it this past Friday. ). ? ?Actinic keratoses treated with Tolak, sore crust on right cheek ?Location:  ?Duration:  ?Quality:  ?Associated Signs/Symptoms: ?Modifying Factors:  ?Severity:  ?Timing: ?Context:  ? ?Objective  ?Well appearing patient in no apparent distress; mood and affect are within normal limits. ?Right Zygomatic Area ?Impetiginized crust on right zygoma (2 skin cancers treated left side of face 2 months ago). ? ?Dorsum of Nose ?Fairly vigorous reaction to topical Tolak with impetiginized change on right cheek (bacterial infections with topical fluorouracil are relatively uncommon). ? ? ? ?A focused examination was performed including head and neck.. Relevant physical exam findings are noted in the Assessment and Plan. ? ? ?Assessment & Plan  ? ? ?Skin infection, bacterial ?Right Zygomatic Area ? ?Bacterial culture.  Twice daily Bactroban for 7 days.  Follow-up by telephone at that time. ? ?mupirocin ointment (BACTROBAN) 2 % - Right Zygomatic Area ?Apply 1 application. topically 2 (two) times daily. Apply to face 1-2 times daily ? ?Related Procedures ?Anaerobic and Aerobic Culture ? ?AK (actinic keratosis) ?Dorsum of Nose ? ?Patient completed treatment with tolak large lesions reacted with possible bacteria ? ?Related Medications ?Fluorouracil (TOLAK) 4 % CREA ?Apply to face qhs x 2 weeks ? ? ? ? ? ?I, Lavonna Monarch, MD, have reviewed all documentation for this visit.  The documentation on 11/23/21 for the exam, diagnosis, procedures, and orders are all accurate and complete. ?

## 2021-11-24 ENCOUNTER — Ambulatory Visit (INDEPENDENT_AMBULATORY_CARE_PROVIDER_SITE_OTHER): Payer: PPO | Admitting: Nurse Practitioner

## 2021-11-24 ENCOUNTER — Encounter: Payer: Self-pay | Admitting: Nurse Practitioner

## 2021-11-24 VITALS — BP 116/73 | HR 70 | Temp 97.7°F | Resp 20 | Ht 65.0 in | Wt 104.0 lb

## 2021-11-24 DIAGNOSIS — Z681 Body mass index (BMI) 19 or less, adult: Secondary | ICD-10-CM

## 2021-11-24 DIAGNOSIS — M810 Age-related osteoporosis without current pathological fracture: Secondary | ICD-10-CM

## 2021-11-24 DIAGNOSIS — K311 Adult hypertrophic pyloric stenosis: Secondary | ICD-10-CM

## 2021-11-24 DIAGNOSIS — R1311 Dysphagia, oral phase: Secondary | ICD-10-CM

## 2021-11-24 DIAGNOSIS — K219 Gastro-esophageal reflux disease without esophagitis: Secondary | ICD-10-CM | POA: Diagnosis not present

## 2021-11-24 DIAGNOSIS — E034 Atrophy of thyroid (acquired): Secondary | ICD-10-CM

## 2021-11-24 DIAGNOSIS — K5901 Slow transit constipation: Secondary | ICD-10-CM | POA: Diagnosis not present

## 2021-11-24 DIAGNOSIS — L719 Rosacea, unspecified: Secondary | ICD-10-CM

## 2021-11-24 DIAGNOSIS — F411 Generalized anxiety disorder: Secondary | ICD-10-CM | POA: Diagnosis not present

## 2021-11-24 DIAGNOSIS — D500 Iron deficiency anemia secondary to blood loss (chronic): Secondary | ICD-10-CM | POA: Diagnosis not present

## 2021-11-24 MED ORDER — LEVOTHYROXINE SODIUM 112 MCG PO TABS: ORAL_TABLET | ORAL | 1 refills | Status: DC

## 2021-11-24 MED ORDER — SUCRALFATE 1 G PO TABS: 1.0000 g | ORAL_TABLET | Freq: Three times a day (TID) | ORAL | 1 refills | Status: DC

## 2021-11-24 MED ORDER — ALPRAZOLAM 0.5 MG PO TABS: 0.5000 mg | ORAL_TABLET | Freq: Four times a day (QID) | ORAL | 5 refills | Status: DC | PRN

## 2021-11-24 MED ORDER — SERTRALINE HCL 100 MG PO TABS: 200.0000 mg | ORAL_TABLET | Freq: Every day | ORAL | 1 refills | Status: DC

## 2021-11-24 MED ORDER — METRONIDAZOLE 0.75 % EX GEL: Freq: Two times a day (BID) | CUTANEOUS | 1 refills | Status: DC

## 2021-11-24 MED ORDER — FOLIC ACID 800 MCG PO TABS: 800.0000 ug | ORAL_TABLET | Freq: Every day | ORAL | 1 refills | Status: DC

## 2021-11-24 MED ORDER — OMEPRAZOLE 40 MG PO CPDR: 40.0000 mg | DELAYED_RELEASE_CAPSULE | Freq: Every day | ORAL | 1 refills | Status: DC

## 2021-11-24 NOTE — Progress Notes (Signed)
? ?Subjective:  ? ? Patient ID: Brittany Hensley, female    DOB: 10-03-1950, 71 y.o.   MRN: [id number] ? ? ?Chief Complaint: Medical Management of Chronic Issues ?  ? ?HPI: ? ?Brittany Hensley is a 71 y.o. who identifies as a female who was assigned female at birth.  ? ?Social history: ?Lives with: by herself ?Work history: retied Marine scientist ? ? ?Comes in today for follow up of the following chronic medical issues: ? ?1. Gastric outlet obstruction ?Sees Dr. Watt Climes and had balloon dilatation in February. Has to have about 2x a year. ? ?2. Oral phase dysphagia ?Swallow has gotten better ? ?3. Slow transit constipation ?Has gotten better. Usually goes daily now ? ?4. Acquired hypertrophic pyloric stenosis ?No issues ? ?5. Gastroesophageal reflux disease without esophagitis ?Is on omeprazole and carafate daily. No symptoms when takes meds ? ?6. Hypothyroidism due to acquired atrophy of thyroid ?No problems that patient is aware of. ?Lab Results  ?Component Value Date  ? TSH 0.123 (L) 05/22/2021  ? ? ? ?7. Osteoporosis without pathological fracture ?She does exercise 3x a week. Last dexascan was done 5/26/2  her t score was -5.1. She cannot tolerate fosamax.  ? ?8. Rosacea ?No recent flare ups ? ?9. Iron deficiency anemia due to chronic blood loss ?No c/o fatigue. ?Lab Results  ?Component Value Date  ? HGB 13.0 10/14/2021  ? ? ? ?10. GAD (generalized anxiety disorder) ?Is on zoloft daily and os doing well. ? ?  11/24/2021  ?  9:58 AM 10/08/2021  ? 11:21 AM 07/24/2021  ?  8:24 AM  ?Depression screen PHQ 2/9  ?Decreased Interest 0 0 0  ?Down, Depressed, Hopeless 0 0 0  ?PHQ - 2 Score 0 0 0  ?Altered sleeping 0  0  ?Tired, decreased energy 0  0  ?Change in appetite 0  0  ?Feeling bad or failure about yourself  0  0  ?Trouble concentrating 0  0  ?Moving slowly or fidgety/restless 0  0  ?Suicidal thoughts 0  0  ?PHQ-9 Score 0  0  ?Difficult doing work/chores Not difficult at all  Not difficult at all  ? ? ?  11/24/2021  ?  9:58 AM  07/24/2021  ?  8:24 AM 05/22/2021  ? 10:16 AM 11/01/2020  ? 10:49 AM  ?GAD 7 : Generalized Anxiety Score  ?Nervous, Anxious, on Edge 0 0 0 1  ?Control/stop worrying 0 0 0 1  ?Worry too much - different things 0 0 0 1  ?Trouble relaxing 0 0 0 0  ?Restless 0 0 0 0  ?Easily annoyed or irritable 0 0 0 0  ?Afraid - awful might happen 0 0 0 0  ?Total GAD 7 Score 0 0 0 3  ?Anxiety Difficulty Not difficult at all Not difficult at all  Not difficult at all  ? ? ? ? ?11. BMI less than 19,adult ?No recent weight changes ?Wt Readings from Last 3 Encounters:  ?11/24/21 104 lb (47.2 kg)  ?10/23/21 104 lb (47.2 kg)  ?10/08/21 104 lb (47.2 kg)  ? ?BMI Readings from Last 3 Encounters:  ?11/24/21 17.31 kg/m?  ?10/23/21 17.31 kg/m?  ?10/08/21 17.31 kg/m?  ? ? ? ? ?New complaints: ?None today ? ?Allergies  ?Allergen Reactions  ? Augmentin [Amoxicillin-Pot Clavulanate] Other (See Comments)  ?  Bloody stool ?Did it involve swelling of the face/tongue/throat, SOB, or low BP? No ?Did it involve sudden or severe rash/hives, skin peeling, or any reaction on  the inside of your mouth or nose? No ?Did you need to seek medical attention at a hospital or doctor's office? No ?When did it last happen?      1 year ?If all above answers are ?NO?, may proceed with cephalosporin use. ?  ? Famotidine Other (See Comments)  ?  Fever ?  ? Klonopin [Clonazepam] Other (See Comments)  ?  double vision  ? Other Other (See Comments)  ? Nitrofurantoin Rash  ? Reglan [Metoclopramide] Anxiety and Other (See Comments)  ?  Causes confusion  ? Sulfamethoxazole-Trimethoprim Rash  ? ?Outpatient Encounter Medications as of 11/24/2021  ?Medication Sig  ? acetaminophen (TYLENOL) 500 MG tablet Take 500 mg by mouth 2 (two) times daily.  ? albuterol (VENTOLIN HFA) 108 (90 Base) MCG/ACT inhaler USE 2 PUFFS EVERY 6 HOURS AS NEEDED  ? ALPRAZolam (XANAX) 0.5 MG tablet Take 1 tablet (0.5 mg total) by mouth 4 (four) times daily as needed for anxiety.  ? Ascorbic Acid (VITAMIN C) 1000  MG tablet Take 1,000 mg by mouth daily.  ? Biotin 10 MG CAPS Take 10 mg by mouth daily.  ? BLACK ELDERBERRY PO Take 50 mg by mouth daily.  ? Calcium Carbonate (CALCIUM 600 PO) Take 600 mg by mouth 2 (two) times daily.  ? calcium citrate (CALCITRATE - DOSED IN MG ELEMENTAL CALCIUM) 950 (200 Ca) MG tablet Take 200 mg of elemental calcium by mouth daily.  ? Cholecalciferol (VITAMIN D) 2000 units tablet Take 2,000 Units by mouth 2 (two) times daily.  ? diclofenac Sodium (VOLTAREN) 1 % GEL APPLY 4 GRAMS 4 TIMES DAILY (Patient taking differently: 2 g daily as needed (Hip pain).)  ? docusate sodium (COLACE) 100 MG capsule Take 1 capsule (100 mg total) by mouth 2 (two) times daily. (Patient taking differently: Take 100 mg by mouth daily.)  ? erythromycin base (E-MYCIN) 500 MG tablet 1 po 1 hour prior to dental procedure and 1 po after procedure  ? folic acid (FOLVITE) 401 MCG tablet Take 1 tablet (800 mcg total) by mouth daily.  ? levothyroxine (SYNTHROID) 112 MCG tablet TAKE ONE (1) TABLET BY MOUTH EVERY DAY  ? loratadine (CLARITIN) 10 MG tablet TAKE ONE (1) TABLET EACH DAY  ? MAGNESIUM CITRATE PO Take 500 mg by mouth daily.  ? metroNIDAZOLE (METROGEL) 0.75 % gel APPLY TWICE DAILY  ? Multiple Vitamins-Minerals (CENTRUM SILVER) CHEW Chew 1 tablet by mouth daily.  ? mupirocin ointment (BACTROBAN) 2 % Apply 1 application. topically 2 (two) times daily. Apply to face 1-2 times daily  ? omeprazole (PRILOSEC) 40 MG capsule Take 1 capsule (40 mg total) by mouth daily.  ? ondansetron (ZOFRAN-ODT) 4 MG disintegrating tablet TAKE 1 TABLET EVERY 8 HOURS AS NEEDED FOR NAUSEA AND VOMITING (Patient taking differently: 4 mg daily as needed for nausea.)  ? Polyethyl Glycol-Propyl Glycol (SYSTANE OP) Place 1 drop into both eyes daily as needed (dry eyes).  ? Probiotic Product (PROBIOTIC PO) Take 1 capsule by mouth daily.   ? sertraline (ZOLOFT) 100 MG tablet Take 2 tablets (200 mg total) by mouth daily. (Patient taking differently: Take  100 mg by mouth daily.)  ? sucralfate (CARAFATE) 1 g tablet TAKE ONE TABLET THREE TIMES A DAY WITH MEALS  ? Turmeric Curcumin 500 MG CAPS Take 500 mg by mouth daily.  ? [DISCONTINUED] Fluorouracil (TOLAK) 4 % CREA Apply to face qhs x 2 weeks  ? ?No facility-administered encounter medications on file as of 11/24/2021.  ? ? ?Past Surgical History:  ?  Procedure Laterality Date  ? APPENDECTOMY    ? BALLOON DILATION  06/16/2011  ? Procedure: BALLOON DILATION;  Surgeon: Jeryl Columbia, MD;  Location: [facility] ENDOSCOPY;  Service: Endoscopy;  Laterality: N/A;  ? BALLOON DILATION N/A 10/03/2013  ? Procedure: BALLOON DILATION;  Surgeon: Jeryl Columbia, MD;  Location: [facility] ENDOSCOPY;  Service: Endoscopy;  Laterality: N/A;  ? BALLOON DILATION N/A 08/10/2014  ? Procedure: BALLOON DILATION;  Surgeon: Jeryl Columbia, MD;  Location: [facility] ENDOSCOPY;  Service: Endoscopy;  Laterality: N/A;  from 12 - 15 cm dilation completed  ? BALLOON DILATION N/A 02/15/2015  ? Procedure: BALLOON DILATION;  Surgeon: Clarene Essex, MD;  Location: [facility] ENDOSCOPY;  Service: Endoscopy;  Laterality: N/A;  ? BALLOON DILATION N/A 11/12/2015  ? Procedure: BALLOON DILATION;  Surgeon: Clarene Essex, MD;  Location: [facility] ENDOSCOPY;  Service: Endoscopy;  Laterality: N/A;  ? BALLOON DILATION N/A 08/10/2016  ? Procedure: BALLOON DILATION;  Surgeon: Clarene Essex, MD;  Location: [facility] ENDOSCOPY;  Service: Endoscopy;  Laterality: N/A;  ? BALLOON DILATION N/A 08/07/2016  ? Procedure: BALLOON DILATION;  Surgeon: Clarene Essex, MD;  Location: [facility] ENDOSCOPY;  Service: Endoscopy;  Laterality: N/A;  ? BALLOON DILATION N/A 05/20/2017  ? Procedure: BALLOON DILATION;  Surgeon: Clarene Essex, MD;  Location: [facility] ENDOSCOPY;  Service: Endoscopy;  Laterality: N/A;  ? BALLOON DILATION N/A 12/02/2017  ? Procedure: BALLOON DILATION;  Surgeon: Clarene Essex, MD;  Location: [facility] ENDOSCOPY;  Service: Endoscopy;  Laterality: N/A;  ? BALLOON DILATION N/A 07/19/2018  ? Procedure: BALLOON DILATION;  Surgeon: Clarene Essex, MD;   Location: [facility] ENDOSCOPY;  Service: Endoscopy;  Laterality: N/A;  ? BALLOON DILATION N/A 01/23/2019  ? Procedure: BALLOON DILATION;  Surgeon: Clarene Essex, MD;  Location: [facility] ENDOSCOPY;  Service: Endoscopy;  Laterality:

## 2021-11-24 NOTE — Addendum Note (Signed)
Addended by: Chevis Pretty on: 11/24/2021 10:26 AM ? ? Modules accepted: Orders ? ?

## 2021-11-24 NOTE — Patient Instructions (Signed)
Bone Health Bones protect organs, store calcium, anchor muscles, and support the whole body. Keeping your bones strong is important, especially as you get older. You can take actions to help keep your bones strong and healthy. Why is keeping my bones healthy important?  Keeping your bones healthy is important because your body constantly replaces bone cells. Cells get old, and new cells take their place. As we age, we lose bone cells because the body may not be able to make enough new cells to replace the old cells. The amount of bone cells and bone tissue you have is referred to as bone mass. The higher your bone mass, the stronger your bones. The aging process leads to an overall loss of bone mass in the body, which can increase the likelihood of: Broken bones. A condition in which the bones become weak and brittle (osteoporosis). A large decline in bone mass occurs in older adults. In women, it occurs about the time of menopause. What actions can I take to keep my bones healthy? Good health habits are important for maintaining healthy bones. This includes eating nutritious foods and exercising regularly. To have healthy bones, you need to get enough of the right minerals and vitamins. Most nutrition experts recommend getting these nutrients from the foods that you eat. In some cases, taking supplements may also be recommended. Doing certain types of exercise is also important for bone health. What are the nutritional recommendations for healthy bones?  Eating a well-balanced diet with plenty of calcium and vitamin D will help to protect your bones. Nutritional recommendations vary from person to person. Ask your health care provider what is healthy for you. Here are some general guidelines. Get enough calcium Calcium is the most important (essential) mineral for bone health. Most people can get enough calcium from their diet, but supplements may be recommended for people who are at risk for  osteoporosis. Good sources of calcium include: Dairy products, such as low-fat or nonfat milk, cheese, and yogurt. Dark green leafy vegetables, such as bok choy and broccoli. Foods that have calcium added to them (are fortified). Foods that may be fortified with calcium include orange juice, cereal, bread, soy beverages, and tofu products. Nuts, such as almonds. Follow these recommended amounts for daily calcium intake: Infants, 0-6 months: 200 mg. Infants, 6-12 months: 260 mg. Children, age 1-3: 700 mg. Children, age 4-8: 1,000 mg. Children, age 9-13: 1,300 mg. Teens, age 14-18: 1,300 mg. Adults, age 19-50: 1,000 mg. Adults, age 51-70: Men: 1,000 mg. Women: 1,200 mg. Adults, age 71 or older: 1,200 mg. Pregnant and breastfeeding females: Teens: 1,300 mg. Adults: 1,000 mg. Get enough vitamin D Vitamin D is the most essential vitamin for bone health. It helps the body absorb calcium. Sunlight stimulates the skin to make vitamin D, so be sure to get enough sunlight. If you live in a cold climate or you do not get outside often, your health care provider may recommend that you take vitamin D supplements. Good sources of vitamin D in your diet include: Egg yolks. Saltwater fish. Milk and cereal fortified with vitamin D. Follow these recommended amounts for daily vitamin D intake: Infants, 0-12 months: 400 international units (IU). Children and teens, age 1-18: 600 international units. Adults, age 59 or younger: 600 international units. Adults, age 60 or older: 600-1,000 international units. Get other important nutrients Other nutrients that are important for bone health include: Phosphorus. This mineral is found in meat, poultry, dairy foods, nuts, and legumes. The   recommended daily intake for adult men and adult women is 700 mg. Magnesium. This mineral is found in seeds, nuts, dark green vegetables, and legumes. The recommended daily intake for adult men is 400-420 mg. For adult women,  it is 310-320 mg. Vitamin K. This vitamin is found in green leafy vegetables. The recommended daily intake is 120 mcg for adult men and 90 mcg for adult women. What type of physical activity is best for building and maintaining healthy bones? Weight-bearing and strength-building activities are important for building and maintaining healthy bones. Weight-bearing activities cause muscles and bones to work against gravity. Strength-building activities increase the strength of the muscles that support bones. Weight-bearing and muscle-building activities include: Walking and hiking. Jogging and running. Dancing. Gym exercises. Lifting weights. Tennis and racquetball. Climbing stairs. Aerobics. Adults should get at least 30 minutes of moderate physical activity on most days. Children should get at least 60 minutes of moderate physical activity on most days. Ask your health care provider what type of exercise is best for you. How can I find out if my bone mass is low? Bone mass can be measured with an X-ray test called a bone mineral density (BMD) test. This test is recommended for all women who are age 65 or older. It may also be recommended for: Men who are age 70 or older. People who are at risk for osteoporosis because of: Having a long-term disease that weakens bones, such as kidney disease or rheumatoid arthritis. Having menopause earlier than normal. Taking medicine that weakens bones, such as steroids, thyroid hormones, or hormone treatment for breast cancer or prostate cancer. Smoking. Drinking three or more alcoholic drinks a day. Being underweight. Sedentary lifestyle. If you find that you have a low bone mass, you may be able to prevent osteoporosis or further bone loss by changing your diet and lifestyle. Where can I find more information? Bone Health & Osteoporosis Foundation: www.nof.org/patients National Institutes of Health: www.bones.nih.gov International Osteoporosis  Foundation: www.iofbonehealth.org Summary The aging process leads to an overall loss of bone mass in the body, which can increase the likelihood of broken bones and osteoporosis. Eating a well-balanced diet with plenty of calcium and vitamin D will help to protect your bones. Weight-bearing and strength-building activities are also important for building and maintaining strong bones. Bone mass can be measured with an X-ray test called a bone mineral density (BMD) test. This information is not intended to replace advice given to you by your health care provider. Make sure you discuss any questions you have with your health care provider. Document Revised: 01/01/2021 Document Reviewed: 01/01/2021 Elsevier Patient Education  2023 Elsevier Inc.  

## 2021-11-25 ENCOUNTER — Other Ambulatory Visit: Payer: Self-pay

## 2021-11-25 DIAGNOSIS — E034 Atrophy of thyroid (acquired): Secondary | ICD-10-CM

## 2021-11-25 LAB — CBC WITH DIFFERENTIAL/PLATELET
Basophils Absolute: 0 10*3/uL (ref 0.0–0.2)
Basos: 1 %
EOS (ABSOLUTE): 0.2 10*3/uL (ref 0.0–0.4)
Eos: 3 %
Hematocrit: 36.9 % (ref 34.0–46.6)
Hemoglobin: 12.3 g/dL (ref 11.1–15.9)
Immature Grans (Abs): 0 10*3/uL (ref 0.0–0.1)
Immature Granulocytes: 0 %
Lymphocytes Absolute: 1.6 10*3/uL (ref 0.7–3.1)
Lymphs: 32 %
MCH: 32.9 pg (ref 26.6–33.0)
MCHC: 33.3 g/dL (ref 31.5–35.7)
MCV: 99 fL — ABNORMAL HIGH (ref 79–97)
Monocytes Absolute: 0.6 10*3/uL (ref 0.1–0.9)
Monocytes: 13 %
Neutrophils Absolute: 2.5 10*3/uL (ref 1.4–7.0)
Neutrophils: 51 %
Platelets: 255 10*3/uL (ref 150–450)
RBC: 3.74 x10E6/uL — ABNORMAL LOW (ref 3.77–5.28)
RDW: 12.1 % (ref 11.7–15.4)
WBC: 5 10*3/uL (ref 3.4–10.8)

## 2021-11-25 LAB — CMP14+EGFR
ALT: 17 IU/L (ref 0–32)
AST: 14 IU/L (ref 0–40)
Albumin/Globulin Ratio: 2.3 — ABNORMAL HIGH (ref 1.2–2.2)
Albumin: 4.1 g/dL (ref 3.7–4.7)
Alkaline Phosphatase: 67 IU/L (ref 44–121)
BUN/Creatinine Ratio: 27 (ref 12–28)
BUN: 24 mg/dL (ref 8–27)
Bilirubin Total: <0.2 mg/dL (ref 0.0–1.2)
CO2: 19 mmol/L — ABNORMAL LOW (ref 20–29)
Calcium: 9 mg/dL (ref 8.7–10.3)
Chloride: 113 mmol/L — ABNORMAL HIGH (ref 96–106)
Creatinine, Ser: 0.88 mg/dL (ref 0.57–1.00)
Globulin, Total: 1.8 g/dL (ref 1.5–4.5)
Glucose: 86 mg/dL (ref 70–99)
Potassium: 4.8 mmol/L (ref 3.5–5.2)
Sodium: 143 mmol/L (ref 134–144)
Total Protein: 5.9 g/dL — ABNORMAL LOW (ref 6.0–8.5)
eGFR: 70 mL/min/{1.73_m2} (ref >59)

## 2021-11-25 LAB — LIPID PANEL
Chol/HDL Ratio: 2.7 ratio (ref 0.0–4.4)
Cholesterol, Total: 155 mg/dL (ref 100–199)
HDL: 57 mg/dL (ref >39)
LDL Chol Calc (NIH): 73 mg/dL (ref 0–99)
Triglycerides: 144 mg/dL (ref 0–149)
VLDL Cholesterol Cal: 25 mg/dL (ref 5–40)

## 2021-11-25 LAB — TSH: TSH: 0.161 u[IU]/mL — ABNORMAL LOW (ref 0.450–4.500)

## 2021-11-25 MED ORDER — LEVOTHYROXINE SODIUM 100 MCG PO TABS: 100.0000 ug | ORAL_TABLET | Freq: Every day | ORAL | 3 refills | Status: DC

## 2021-11-25 NOTE — Addendum Note (Signed)
Addended by: Chevis Pretty on: 11/25/2021 07:51 AM ? ? Modules accepted: Orders ? ?

## 2021-11-28 LAB — TOXASSURE SELECT 13 (MW), URINE

## 2021-12-02 ENCOUNTER — Ambulatory Visit (INDEPENDENT_AMBULATORY_CARE_PROVIDER_SITE_OTHER): Payer: PPO | Admitting: Physician Assistant

## 2021-12-02 ENCOUNTER — Encounter: Payer: Self-pay | Admitting: Physician Assistant

## 2021-12-02 DIAGNOSIS — L570 Actinic keratosis: Secondary | ICD-10-CM | POA: Diagnosis not present

## 2021-12-02 DIAGNOSIS — Z1283 Encounter for screening for malignant neoplasm of skin: Secondary | ICD-10-CM

## 2021-12-02 DIAGNOSIS — L821 Other seborrheic keratosis: Secondary | ICD-10-CM

## 2021-12-02 DIAGNOSIS — Z85828 Personal history of other malignant neoplasm of skin: Secondary | ICD-10-CM | POA: Diagnosis not present

## 2021-12-08 ENCOUNTER — Encounter: Payer: Self-pay | Admitting: Physician Assistant

## 2021-12-08 NOTE — Progress Notes (Signed)
? ?  Follow-Up Visit ?  ?Subjective  ?Brittany Hensley is a 71 y.o. female who presents for the following: Follow-up (F/u -- tolak- good reaction- last application was feb 29. Personal history of non mole skin cancers. ). ? ? ?The following portions of the chart were reviewed this encounter and updated as appropriate:  Tobacco  Allergies  Meds  Problems  Med Hx  Surg Hx  Fam Hx   ?  ? ?Objective  ?Well appearing patient in no apparent distress; mood and affect are within normal limits. ? ?A full examination was performed including scalp, head, eyes, ears, nose, lips, neck, chest, axillae, abdomen, back, buttocks, bilateral upper extremities, bilateral lower extremities, hands, feet, fingers, toes, fingernails, and toenails. All findings within normal limits unless otherwise noted below. ? ?Full body skin examination-No atypical nevi or signs of NMSC noted at the time of the visit.   ? ?Stuck-on, waxy, tan-brown papules and plaques. --Discussed benign etiology and prognosis.  ? ?Head - Anterior (Face) ?Erythematous patches with gritty scale. ? ? ?Assessment & Plan  ?AK (actinic keratosis) ?Head - Anterior (Face) ? ?Will use tolak cream on her entire face this winter ? ?Encounter for screening for malignant neoplasm of skin ? ?Yearly skin examination  ? ?Seborrheic keratosis ? ?Okay to leave if stable ? ? ? ?I, Cheryel Kyte, PA-C, have reviewed all documentation's for this visit.  The documentation on 12/08/21 for the exam, diagnosis, procedures and orders are all accurate and complete. ?

## 2021-12-10 ENCOUNTER — Other Ambulatory Visit: Payer: Self-pay | Admitting: Dermatology

## 2021-12-10 DIAGNOSIS — B9689 Other specified bacterial agents as the cause of diseases classified elsewhere: Secondary | ICD-10-CM

## 2021-12-21 ENCOUNTER — Other Ambulatory Visit: Payer: Self-pay | Admitting: Nurse Practitioner

## 2022-01-19 ENCOUNTER — Other Ambulatory Visit: Payer: Self-pay | Admitting: Nurse Practitioner

## 2022-01-21 ENCOUNTER — Other Ambulatory Visit: Payer: PPO

## 2022-01-21 ENCOUNTER — Other Ambulatory Visit: Payer: Self-pay

## 2022-01-21 DIAGNOSIS — E034 Atrophy of thyroid (acquired): Secondary | ICD-10-CM

## 2022-01-22 LAB — TSH: TSH: 0.406 u[IU]/mL — ABNORMAL LOW (ref 0.450–4.500)

## 2022-01-26 ENCOUNTER — Telehealth: Payer: Self-pay | Admitting: Nurse Practitioner

## 2022-01-28 ENCOUNTER — Telehealth: Payer: Self-pay | Admitting: Nurse Practitioner

## 2022-01-29 NOTE — Telephone Encounter (Signed)
You do not necessariy need to see endocrinology. We just need to do medication adjustments. You need to stya on the synthroid 134mg for now. TSH is tolow and if we increase back to the 1173m it will decrease it even further. Need to rechek TSH in 1 month.

## 2022-01-29 NOTE — Telephone Encounter (Signed)
Patient informed. Will come in for labs in 1 month.

## 2022-01-29 NOTE — Telephone Encounter (Signed)
Patient calling back on message left 6-28

## 2022-02-12 ENCOUNTER — Encounter: Payer: Self-pay | Admitting: Oncology

## 2022-02-13 ENCOUNTER — Inpatient Hospital Stay: Payer: PPO

## 2022-02-17 ENCOUNTER — Other Ambulatory Visit: Payer: Self-pay

## 2022-02-17 ENCOUNTER — Other Ambulatory Visit: Payer: PPO

## 2022-02-17 DIAGNOSIS — E034 Atrophy of thyroid (acquired): Secondary | ICD-10-CM | POA: Diagnosis not present

## 2022-02-18 LAB — THYROID PANEL WITH TSH
Free Thyroxine Index: 1.5 (ref 1.2–4.9)
T3 Uptake Ratio: 30 % (ref 24–39)
T4, Total: 5 ug/dL (ref 4.5–12.0)
TSH: 0.866 u[IU]/mL (ref 0.450–4.500)

## 2022-02-20 ENCOUNTER — Inpatient Hospital Stay: Payer: PPO | Attending: Oncology

## 2022-02-20 DIAGNOSIS — D500 Iron deficiency anemia secondary to blood loss (chronic): Secondary | ICD-10-CM | POA: Insufficient documentation

## 2022-02-20 LAB — CBC WITH DIFFERENTIAL (CANCER CENTER ONLY)
Abs Immature Granulocytes: 0.05 10*3/uL (ref 0.00–0.07)
Basophils Absolute: 0 10*3/uL (ref 0.0–0.1)
Basophils Relative: 1 %
Eosinophils Absolute: 0.2 10*3/uL (ref 0.0–0.5)
Eosinophils Relative: 3 %
HCT: 39 % (ref 36.0–46.0)
Hemoglobin: 12.8 g/dL (ref 12.0–15.0)
Immature Granulocytes: 1 %
Lymphocytes Relative: 33 %
Lymphs Abs: 2 10*3/uL (ref 0.7–4.0)
MCH: 32.8 pg (ref 26.0–34.0)
MCHC: 32.8 g/dL (ref 30.0–36.0)
MCV: 100 fL (ref 80.0–100.0)
Monocytes Absolute: 0.7 10*3/uL (ref 0.1–1.0)
Monocytes Relative: 11 %
Neutro Abs: 3.2 10*3/uL (ref 1.7–7.7)
Neutrophils Relative %: 51 %
Platelet Count: 264 10*3/uL (ref 150–400)
RBC: 3.9 MIL/uL (ref 3.87–5.11)
RDW: 12.8 % (ref 11.5–15.5)
WBC Count: 6.1 10*3/uL (ref 4.0–10.5)
nRBC: 0 % (ref 0.0–0.2)

## 2022-02-20 LAB — FERRITIN: Ferritin: 192 ng/mL (ref 11–307)

## 2022-02-24 ENCOUNTER — Telehealth: Payer: Self-pay

## 2022-02-24 NOTE — Telephone Encounter (Signed)
Patient gave verbal understanding and request for lab result to mailed out to her.

## 2022-02-24 NOTE — Telephone Encounter (Signed)
-----   Message from Brittany Pier, MD sent at 02/23/2022  6:11 PM EDT ----- Please call patient, hb and ferritin are normal, f/u as scheduled

## 2022-03-12 ENCOUNTER — Other Ambulatory Visit: Payer: Self-pay | Admitting: Gastroenterology

## 2022-03-13 ENCOUNTER — Encounter (HOSPITAL_COMMUNITY): Payer: Self-pay | Admitting: Gastroenterology

## 2022-03-18 NOTE — Anesthesia Preprocedure Evaluation (Addendum)
Anesthesia Evaluation  Patient identified by MRN, date of birth, ID band Patient awake    Reviewed: Allergy & Precautions, NPO status , Patient's Chart, lab work & pertinent test results  Airway Mallampati: II  TM Distance: >3 FB Neck ROM: Full    Dental no notable dental hx. (+) Teeth Intact, Dental Advisory Given   Pulmonary neg pulmonary ROS,    Pulmonary exam normal breath sounds clear to auscultation       Cardiovascular Exercise Tolerance: Good Normal cardiovascular exam Rhythm:Regular Rate:Normal     Neuro/Psych PSYCHIATRIC DISORDERS Anxiety Depression negative neurological ROS     GI/Hepatic GERD  ,  Endo/Other  Hypothyroidism   Renal/GU      Musculoskeletal  (+) Arthritis ,   Abdominal   Peds  Hematology   Anesthesia Other Findings ALL: see list  Reproductive/Obstetrics                           Anesthesia Physical Anesthesia Plan  ASA: 3  Anesthesia Plan: MAC   Post-op Pain Management: Minimal or no pain anticipated   Induction: Intravenous  PONV Risk Score and Plan: Treatment may vary due to age or medical condition, TIVA and Propofol infusion  Airway Management Planned: Natural Airway and Nasal Cannula  Additional Equipment: None  Intra-op Plan:   Post-operative Plan:   Informed Consent: I have reviewed the patients History and Physical, chart, labs and discussed the procedure including the risks, benefits and alternatives for the proposed anesthesia with the patient or authorized representative who has indicated his/her understanding and acceptance.     Dental advisory given  Plan Discussed with: CRNA  Anesthesia Plan Comments:        Anesthesia Quick Evaluation

## 2022-03-19 ENCOUNTER — Ambulatory Visit (HOSPITAL_BASED_OUTPATIENT_CLINIC_OR_DEPARTMENT_OTHER): Payer: PPO | Admitting: Anesthesiology

## 2022-03-19 ENCOUNTER — Ambulatory Visit (HOSPITAL_COMMUNITY)
Admission: RE | Admit: 2022-03-19 | Discharge: 2022-03-19 | Disposition: A | Discharge status: Home / Self Care | Payer: PPO | Attending: Gastroenterology | Admitting: Gastroenterology

## 2022-03-19 ENCOUNTER — Ambulatory Visit (HOSPITAL_COMMUNITY): Payer: PPO | Admitting: Anesthesiology

## 2022-03-19 ENCOUNTER — Other Ambulatory Visit: Payer: Self-pay

## 2022-03-19 ENCOUNTER — Encounter (HOSPITAL_COMMUNITY): Payer: Self-pay | Admitting: Gastroenterology

## 2022-03-19 ENCOUNTER — Encounter (HOSPITAL_COMMUNITY)
Admission: RE | Disposition: A | Discharge status: Home / Self Care | Payer: Self-pay | Source: Home / Self Care | Attending: Gastroenterology

## 2022-03-19 DIAGNOSIS — R627 Adult failure to thrive: Secondary | ICD-10-CM | POA: Diagnosis not present

## 2022-03-19 DIAGNOSIS — Z980 Intestinal bypass and anastomosis status: Secondary | ICD-10-CM | POA: Insufficient documentation

## 2022-03-19 DIAGNOSIS — K311 Adult hypertrophic pyloric stenosis: Secondary | ICD-10-CM | POA: Insufficient documentation

## 2022-03-19 DIAGNOSIS — F32A Depression, unspecified: Secondary | ICD-10-CM | POA: Diagnosis not present

## 2022-03-19 DIAGNOSIS — K449 Diaphragmatic hernia without obstruction or gangrene: Secondary | ICD-10-CM

## 2022-03-19 DIAGNOSIS — F419 Anxiety disorder, unspecified: Secondary | ICD-10-CM | POA: Insufficient documentation

## 2022-03-19 DIAGNOSIS — E039 Hypothyroidism, unspecified: Secondary | ICD-10-CM | POA: Insufficient documentation

## 2022-03-19 DIAGNOSIS — M1990 Unspecified osteoarthritis, unspecified site: Secondary | ICD-10-CM | POA: Diagnosis not present

## 2022-03-19 DIAGNOSIS — K219 Gastro-esophageal reflux disease without esophagitis: Secondary | ICD-10-CM | POA: Diagnosis not present

## 2022-03-19 DIAGNOSIS — F418 Other specified anxiety disorders: Secondary | ICD-10-CM | POA: Diagnosis not present

## 2022-03-19 HISTORY — PX: BALLOON DILATION: SHX5330

## 2022-03-19 HISTORY — PX: ESOPHAGOGASTRODUODENOSCOPY (EGD) WITH PROPOFOL: SHX5813

## 2022-03-19 SURGERY — ESOPHAGOGASTRODUODENOSCOPY (EGD) WITH PROPOFOL
Anesthesia: Monitor Anesthesia Care

## 2022-03-19 MED ORDER — PROPOFOL 10 MG/ML IV BOLUS
INTRAVENOUS | Status: DC | PRN
  Administered 2022-03-19: 50 mg via INTRAVENOUS

## 2022-03-19 MED ORDER — PROPOFOL 500 MG/50ML IV EMUL
INTRAVENOUS | Status: DC | PRN
  Administered 2022-03-19: 100 ug/kg/min via INTRAVENOUS

## 2022-03-19 MED ORDER — ONDANSETRON HCL 4 MG/2ML IJ SOLN
INTRAMUSCULAR | Status: DC | PRN
  Administered 2022-03-19: 4 mg via INTRAVENOUS

## 2022-03-19 MED ORDER — LACTATED RINGERS IV SOLN
INTRAVENOUS | Status: DC
  Administered 2022-03-19: 1000 mL via INTRAVENOUS

## 2022-03-19 MED ORDER — SODIUM CHLORIDE 0.9 % IV SOLN: INTRAVENOUS | Status: DC

## 2022-03-19 SURGICAL SUPPLY — 15 items

## 2022-03-19 NOTE — Anesthesia Postprocedure Evaluation (Signed)
Anesthesia Post Note  Patient: Brittany Hensley  Procedure(s) Performed: ESOPHAGOGASTRODUODENOSCOPY (EGD) WITH PROPOFOL Balloon dilation wire-guided     Patient location during evaluation: Endoscopy Anesthesia Type: MAC Level of consciousness: awake and alert Pain management: pain level controlled Vital Signs Assessment: post-procedure vital signs reviewed and stable Respiratory status: spontaneous breathing, nonlabored ventilation, respiratory function stable and patient connected to nasal cannula oxygen Cardiovascular status: blood pressure returned to baseline and stable Postop Assessment: no apparent nausea or vomiting Anesthetic complications: no   No notable events documented.  Last Vitals:  Vitals:   03/19/22 1550 03/19/22 1600  BP: 103/60 110/64  Pulse: 72   Resp: 14   Temp:    SpO2: 100%     Last Pain:  Vitals:   03/19/22 1600  TempSrc:   PainSc: 0-No pain                 Barnet Glasgow

## 2022-03-19 NOTE — Op Note (Signed)
[facility] Patient Name: Brittany Hensley Procedure Date: 03/19/2022 MRN: [id number] Attending MD: Clarene Essex , MD Date of Birth: Nov 07, 1950 CSN: [id number] Age: 71 Admit Type: Outpatient Procedure:                Upper GI endoscopy Indications:              Failure to thrive decreased stomach emptying                            secondary to history of recurrent anastomosis                            stricture helped by periodic dilations Providers:                Clarene Essex, MD, Dulcy Fanny, Cletis Athens,                            Technician Referring MD:              Medicines:                Monitored Anesthesia Care Complications:            No immediate complications. Estimated Blood Loss:     Estimated blood loss was minimal. Procedure:                Pre-Anesthesia Assessment:                           - Prior to the procedure, a History and Physical                            was performed, and patient medications and                            allergies were reviewed. The patient's tolerance of                            previous anesthesia was also reviewed. The risks                            and benefits of the procedure and the sedation                            options and risks were discussed with the patient.                            All questions were answered, and informed consent                            was obtained. Prior Anticoagulants: The patient has                            taken no previous anticoagulant or antiplatelet                            agents. ASA  Grade Assessment: II - A patient with                            mild systemic disease. After reviewing the risks                            and benefits, the patient was deemed in                            satisfactory condition to undergo the procedure.                           After obtaining informed consent, the endoscope was                            passed under  direct vision. Throughout the                            procedure, the patient's blood pressure, pulse, and                            oxygen saturations were monitored continuously. The                            GIF-H190 ([id number] Olympus endoscope was introduced                            through the mouth, and advanced to the jejunum. The                            upper GI endoscopy was accomplished without                            difficulty. The patient tolerated the procedure                            well. Scope In: Scope Out: Findings:      The larynx was normal.      A tiny hiatal hernia was present.      Evidence of a gastrojejunostomy was found in the gastric body. This was       characterized by congestion, erythema and mild stenosis. It looked       better than I ever remember seeing him in the past but we proceeded with       A TTS dilator was passed through the scope. Dilation with an 18-19-20 mm       balloon dilator was performed to 20 mm. The dilation site was examined       and showed mild mucosal disruption and mild improvement in luminal       narrowing.      The examined jejunum was normal.      The exam was otherwise without abnormality. Impression:               - Normal larynx.                           -  Tiny hiatal hernia.                           - A gastrojejunostomy was found, characterized by                            congestion, erythema and mild stenosis. Dilated.                           - Normal examined jejunum.                           - The examination was otherwise normal.                           - No specimens collected. Moderate Sedation:      Not Applicable - Patient had care per Anesthesia. Recommendation:           - Patient has a contact number available for                            emergencies. The signs and symptoms of potential                            delayed complications were discussed with the                             patient. Return to normal activities tomorrow.                            Written discharge instructions were provided to the                            patient.                           - Full liquid diet for 2 hours. Then slowly advance                            diet                           - Continue present medications.                           - Return to GI clinic PRN.                           - Telephone GI clinic if symptomatic PRN. Procedure Code(s):        --- Professional ---                           [phone], Esophagogastroduodenoscopy, flexible,                            transoral; with dilation of gastric/duodenal  stricture(s) (eg, balloon, bougie) Diagnosis Code(s):        --- Professional ---                           K44.9, Diaphragmatic hernia without obstruction or                            gangrene                           Z98.0, Intestinal bypass and anastomosis status                           R62.7, Adult failure to thrive CPT copyright 2019 [employer]. All rights reserved. The codes documented in this report are preliminary and upon coder review may  be revised to meet current compliance requirements. Clarene Essex, MD 03/19/2022 3:43:50 PM This report has been signed electronically. Number of Addenda: 0

## 2022-03-19 NOTE — Transfer of Care (Signed)
Immediate Anesthesia Transfer of Care Note  Patient: Brittany Hensley  Procedure(s) Performed: ESOPHAGOGASTRODUODENOSCOPY (EGD) WITH PROPOFOL Balloon dilation wire-guided  Patient Location: PACU  Anesthesia Type:MAC  Level of Consciousness: awake, alert , oriented and patient cooperative  Airway & Oxygen Therapy: Patient Spontanous Breathing  Post-op Assessment: Report given to RN, Post -op Vital signs reviewed and stable and Patient moving all extremities X 4  Post vital signs: Reviewed and stable  Last Vitals:  Vitals Value Taken Time  BP 94/54 03/19/22 1540  Temp 36.5 C 03/19/22 1537  Pulse 76 03/19/22 1540  Resp 16 03/19/22 1540  SpO2 99 % 03/19/22 1540  Vitals shown include unvalidated device data.  Last Pain:  Vitals:   03/19/22 1537  TempSrc: Temporal  PainSc: 0-No pain         Complications: No notable events documented.

## 2022-03-19 NOTE — Discharge Instructions (Addendum)
YOU HAD AN ENDOSCOPIC PROCEDURE TODAY: Refer to the procedure report and other information in the discharge instructions given to you for any specific questions about what was found during the examination. If this information does not answer your questions, please call [facility] office at [phone] to clarify.   YOU SHOULD EXPECT: Some feelings of bloating in the abdomen. Passage of more gas than usual. Walking can help get rid of the air that was put into your GI tract during the procedure and reduce the bloating. If you had a lower endoscopy (such as a colonoscopy or flexible sigmoidoscopy) you may notice spotting of blood in your stool or on the toilet paper. Some abdominal soreness may be present for a day or two, also.  DIET: Your first meal following the procedure should be a light meal and then it is ok to progress to your normal diet. A half-sandwich or bowl of soup is an example of a good first meal. Heavy or fried foods are harder to digest and may make you feel nauseous or bloated. Drink plenty of fluids but you should avoid alcoholic beverages for 24 hours. If you had a esophageal dilation, please see attached instructions for diet.    ACTIVITY: Your care partner should take you home directly after the procedure. You should plan to take it easy, moving slowly for the rest of the day. You can resume normal activity the day after the procedure however YOU SHOULD NOT DRIVE, use power tools, machinery or perform tasks that involve climbing or major physical exertion for 24 hours (because of the sedation medicines used during the test).   SYMPTOMS TO REPORT IMMEDIATELY: A gastroenterologist can be reached at any hour. Please call [phone]  for any of the following symptoms:  Following lower endoscopy (colonoscopy, flexible sigmoidoscopy) Excessive amounts of blood in the stool  Significant tenderness, worsening of abdominal pains  Swelling of the abdomen that is new, acute  Fever of 100  or higher  Following upper endoscopy (EGD, EUS, ERCP, esophageal dilation) Vomiting of blood or coffee ground material  New, significant abdominal pain  New, significant chest pain or pain under the shoulder blades  Painful or persistently difficult swallowing  New shortness of breath  Black, tarry-looking or red, bloody stools  FOLLOW UP:  If any biopsies were taken you will be contacted by phone or by letter within the next 1-3 weeks. Call [phone]  if you have not heard about the biopsies in 3 weeks.  Please also call with any specific questions about appointments or follow up tests. YOU HAD AN ENDOSCOPIC PROCEDURE TODAY: Refer to the procedure report and other information in the discharge instructions given to you for any specific questions about what was found during the examination. If this information does not answer your questions, please call [facility] office at [phone] to clarify.  Call me if any GI question or problem and follow-up as needed  YOU SHOULD EXPECT: Some feelings of bloating in the abdomen. Passage of more gas than usual. Walking can help get rid of the air that was put into your GI tract during the procedure and reduce the bloating. If you had a lower endoscopy (such as a colonoscopy or flexible sigmoidoscopy) you may notice spotting of blood in your stool or on the toilet paper. Some abdominal soreness may be present for a day or two, also.  DIET: Begin with liquids only for 2 hours and then slowly advance as tolerated and your first meal following  the procedure should be a light meal and then it is ok to progress to your normal diet. A half-sandwich or bowl of soup is an example of a good first meal. Heavy or fried foods are harder to digest and may make you feel nauseous or bloated. Drink plenty of fluids but you should avoid alcoholic beverages for 24 hours. If you had a esophageal dilation, please see attached instructions for diet.    ACTIVITY: Your care partner  should take you home directly after the procedure. You should plan to take it easy, moving slowly for the rest of the day. You can resume normal activity the day after the procedure however YOU SHOULD NOT DRIVE, use power tools, machinery or perform tasks that involve climbing or major physical exertion for 24 hours (because of the sedation medicines used during the test).   SYMPTOMS TO REPORT IMMEDIATELY: A gastroenterologist can be reached at any hour. Please call [phone]  for any of the following symptoms:  Following upper endoscopy (EGD, EUS, ERCP, esophageal dilation) Vomiting of blood or coffee ground material  New, significant abdominal pain  New, significant chest pain or pain under the shoulder blades  Painful or persistently difficult swallowing  New shortness of breath  Black, tarry-looking or red, bloody stools  FOLLOW UP:  If any biopsies were taken you will be contacted by phone or by letter within the next 1-3 weeks. Call [phone]  if you have not heard about the biopsies in 3 weeks.  Please also call with any specific questions about appointments or follow up tests.

## 2022-03-19 NOTE — Progress Notes (Signed)
Brittany Hensley 1:43 PM  Subjective: Patient seen and examined and her digestion is getting a little sluggish but no new medical complaints since we last saw her and hemoglobin has been fine  Objective: Vital signs stable afebrile no acute distress exam please see preassessment evaluation  Assessment: Anastomotic stricture  Plan: Okay to proceed with endoscopy with balloon dilation with anesthesia assistance  [facility]  office [phone] After 5PM or if no answer call [phone]

## 2022-03-22 ENCOUNTER — Encounter (HOSPITAL_COMMUNITY): Payer: Self-pay | Admitting: Gastroenterology

## 2022-03-24 ENCOUNTER — Other Ambulatory Visit: Payer: Self-pay | Admitting: Family Medicine

## 2022-03-24 ENCOUNTER — Other Ambulatory Visit: Payer: Self-pay | Admitting: Nurse Practitioner

## 2022-03-24 DIAGNOSIS — R011 Cardiac murmur, unspecified: Secondary | ICD-10-CM

## 2022-04-21 ENCOUNTER — Other Ambulatory Visit: Payer: Self-pay | Admitting: Nurse Practitioner

## 2022-04-21 DIAGNOSIS — D500 Iron deficiency anemia secondary to blood loss (chronic): Secondary | ICD-10-CM

## 2022-04-22 ENCOUNTER — Encounter: Payer: Self-pay | Admitting: Family Medicine

## 2022-04-22 ENCOUNTER — Ambulatory Visit (INDEPENDENT_AMBULATORY_CARE_PROVIDER_SITE_OTHER): Payer: PPO | Admitting: Family Medicine

## 2022-04-22 DIAGNOSIS — J069 Acute upper respiratory infection, unspecified: Secondary | ICD-10-CM | POA: Diagnosis not present

## 2022-04-22 MED ORDER — DEXTROMETHORPHAN POLISTIREX ER 30 MG/5ML PO SUER: 15.0000 mg | ORAL | 0 refills | Status: DC | PRN

## 2022-04-22 MED ORDER — GUAIFENESIN ER 600 MG PO TB12: 600.0000 mg | ORAL_TABLET | Freq: Two times a day (BID) | ORAL | 0 refills | Status: AC

## 2022-04-22 NOTE — Progress Notes (Signed)
Virtual Visit via telephone Note Due to COVID-19 pandemic this visit was conducted virtually. This visit type was conducted due to national recommendations for restrictions regarding the COVID-19 Pandemic (e.g. social distancing, sheltering in place) in an effort to limit this patient's exposure and mitigate transmission in our community. All issues noted in this document were discussed and addressed.  A physical exam was not performed with this format.   I connected with Brittany Hensley on 04/22/2022 at 1025 by telephone and verified that I am speaking with the correct person using two identifiers. Brittany Hensley is currently located at home and patient is currently with them during visit. The provider, Monia Pouch, FNP is located in their office at time of visit.  I discussed the limitations, risks, security and privacy concerns of performing an evaluation and management service by virtual visit and the availability of in person appointments. I also discussed with the patient that there may be a patient responsible charge related to this service. The patient expressed understanding and agreed to proceed.  Subjective:  Patient ID: Brittany Hensley, female    DOB: April 20, 1951, 71 y.o.   MRN: [id number]  Chief Complaint:  URI   HPI: Brittany Hensley is a 71 y.o. female presenting on 04/22/2022 for URI   URI  This is a new problem. The current episode started yesterday. The problem has been unchanged. There has been no fever. Associated symptoms include congestion, coughing, rhinorrhea and a sore throat. Pertinent negatives include no abdominal pain, chest pain, diarrhea, dysuria, ear pain, headaches, joint pain, joint swelling, nausea, neck pain, plugged ear sensation, rash, sinus pain, sneezing, swollen glands, vomiting or wheezing. She has tried nothing for the symptoms.     Relevant past medical, surgical, family, and social history reviewed and updated as indicated.  Allergies and  medications reviewed and updated.   Past Medical History:  Diagnosis Date   Anemia    iron    Anxiety    Arthritis    COVID    2020 bad cold s/s lasted 1 week. 04/28/2021   Depression    Diverticulitis    Hx: of   GERD (gastroesophageal reflux disease)    at times for 15 yrs 04/28/2021   History of kidney stones    10 years ago   Hypothyroidism    Low iron    Pneumonia    2020   Rosacea    Hx: of   SCC (squamous cell carcinoma) 08/13/2021   in situ-left temple (CX35FU)   SCC (squamous cell carcinoma) 08/13/2021   well diff-right forehead (CX35FU)   Squamous cell carcinoma of skin 08/03/2021   in situ- left malar cheek (CX35FU)    Past Surgical History:  Procedure Laterality Date   APPENDECTOMY     BALLOON DILATION  06/16/2011   Procedure: BALLOON DILATION;  Surgeon: Jeryl Columbia, MD;  Location: [facility] ENDOSCOPY;  Service: Endoscopy;  Laterality: N/A;   BALLOON DILATION N/A 10/03/2013   Procedure: BALLOON DILATION;  Surgeon: Jeryl Columbia, MD;  Location: [facility] ENDOSCOPY;  Service: Endoscopy;  Laterality: N/A;   BALLOON DILATION N/A 08/10/2014   Procedure: BALLOON DILATION;  Surgeon: Jeryl Columbia, MD;  Location: [facility] ENDOSCOPY;  Service: Endoscopy;  Laterality: N/A;  from 12 - 15 cm dilation completed   BALLOON DILATION N/A 02/15/2015   Procedure: BALLOON DILATION;  Surgeon: Clarene Essex, MD;  Location: [facility] ENDOSCOPY;  Service: Endoscopy;  Laterality: N/A;   BALLOON DILATION N/A 11/12/2015  Procedure: BALLOON DILATION;  Surgeon: Clarene Essex, MD;  Location: [facility] ENDOSCOPY;  Service: Endoscopy;  Laterality: N/A;   BALLOON DILATION N/A 08/10/2016   Procedure: BALLOON DILATION;  Surgeon: Clarene Essex, MD;  Location: [facility] ENDOSCOPY;  Service: Endoscopy;  Laterality: N/A;   BALLOON DILATION N/A 08/07/2016   Procedure: BALLOON DILATION;  Surgeon: Clarene Essex, MD;  Location: [facility] ENDOSCOPY;  Service: Endoscopy;  Laterality: N/A;   BALLOON DILATION N/A 05/20/2017   Procedure: BALLOON DILATION;   Surgeon: Clarene Essex, MD;  Location: [facility]OSCOPY;  Service: Endoscopy;  Laterality: N/A;   BALLOON DILATION N/A 12/02/2017   Procedure: BALLOON DILATION;  Surgeon: Clarene Essex, MD;  Location: [place];  Service: Endoscopy;  Laterality: N/A;   BALLOON DILATION N/A 07/19/2018   Procedure: BALLOON DILATION;  Surgeon: Clarene Essex, MD;  Location: [facility]OS[facility];  Service: Endoscopy;  Laterality: N/A;   BALLOON DILATION N/A 01/23/2019   Procedure: BALLOON DILATION;  Surgeon: Clarene Essex, MD;  Location: [facility];  Service: Endoscopy;  Laterality: N/A;   BALLOON DILATION N/A 07/13/2019   Procedure: BALLOON DILATION;  Surgeon: Clarene Essex, MD;  Location: [facility];  Service: Endoscopy;  Laterality: N/A;   BALLOON DILATION N/A 02/02/2020   Procedure: BALLOON DILATION;  Surgeon: Clarene Essex, MD;  Location: [facility];  Service: Endoscopy;  Laterality: N/A;   BALLOON DILATION N/A 07/16/2020   Procedure: BALLOON DILATION;  Surgeon: Clarene Essex, MD;  Location: [facility]OS[facility];  Service: Endoscopy;  Laterality: N/A;   BALLOON DILATION N/A 02/19/2021   Procedure: BALLOON DILATION;  Surgeon: Clarene Essex, MD;  Location: [facility];  Service: Endoscopy;  Laterality: N/A;   BALLOON DILATION N/A 09/30/2021   Procedure: BALLOON DILATION;  Surgeon: Clarene Essex, MD;  Location: [facility];  Service: Endoscopy;  Laterality: N/A;   BALLOON DILATION N/A 03/19/2022   Procedure: BALLOON DILATION;  Surgeon: Clarene Essex, MD;  Location: [facility];  Service: Gastroenterology;  Laterality: N/A;   CATARACT EXTRACTION, BILATERAL     2019   CHOLECYSTECTOMY OPEN  1979   COLONOSCOPY     Hx: of   CYSTOSCOPY/URETEROSCOPY/HOLMIUM LASER/STENT PLACEMENT Bilateral 05/06/2021   Procedure: CYSTOSCOPY BILATERAL RETROGRADE LEFT URETEROSCOPY/HOLMIUM LASER/STENT PLACEMENT;  Surgeon: [name], MD;  Location: [facility];  Service: Urology;  Laterality: Bilateral;   ESOPHAGOGASTRODUODENOSCOPY  06/16/2011    Procedure: ESOPHAGOGASTRODUODENOSCOPY (EGD);  Surgeon: Jeryl Columbia, MD;  Location: [facility] ENDOSCOPY;  Service: Endoscopy;  Laterality: N/A;   ESOPHAGOGASTRODUODENOSCOPY N/A 10/03/2013   Procedure: ESOPHAGOGASTRODUODENOSCOPY (EGD);  Surgeon: Jeryl Columbia, MD;  Location: [facility];  Service: Endoscopy;  Laterality: N/A;   ESOPHAGOGASTRODUODENOSCOPY N/A 08/10/2014   Procedure: ESOPHAGOGASTRODUODENOSCOPY (EGD);  Surgeon: Jeryl Columbia, MD;  Location: [facility] ENDOSCOPY;  Service: Endoscopy;  Laterality: N/A;   ESOPHAGOGASTRODUODENOSCOPY N/A 08/10/2016   Procedure: ESOPHAGOGASTRODUODENOSCOPY (EGD);  Surgeon: Clarene Essex, MD;  Location: [facility] ENDOSCOPY;  Service: Endoscopy;  Laterality: N/A;   ESOPHAGOGASTRODUODENOSCOPY N/A 07/19/2018   Procedure: ESOPHAGOGASTRODUODENOSCOPY (EGD);  Surgeon: Clarene Essex, MD;  Location: [facility];  Service: Endoscopy;  Laterality: N/A;   ESOPHAGOGASTRODUODENOSCOPY (EGD) WITH PROPOFOL N/A 02/15/2015   Procedure: ESOPHAGOGASTRODUODENOSCOPY (EGD) WITH PROPOFOL;  Surgeon: Clarene Essex, MD;  Location: [facility];  Service: Endoscopy;  Laterality: N/A;   ESOPHAGOGASTRODUODENOSCOPY (EGD) WITH PROPOFOL N/A 11/12/2015   Procedure: ESOPHAGOGASTRODUODENOSCOPY (EGD) WITH PROPOFOL;  Surgeon: Clarene Essex, MD;  Location: [facility] ENDOSCOPY;  Service: Endoscopy;  Laterality: N/A;   ESOPHAGOGASTRODUODENOSCOPY (EGD) WITH PROPOFOL N/A 08/07/2016   Procedure: ESOPHAGOGASTRODUODENOSCOPY (EGD) WITH PROPOFOL;  Surgeon: Clarene Essex, MD;  Location: [facility] ENDOSCOPY;  Service: Endoscopy;  Laterality: N/A;   ESOPHAGOGASTRODUODENOSCOPY (EGD) WITH PROPOFOL N/A 05/20/2017   Procedure: ESOPHAGOGASTRODUODENOSCOPY (EGD) WITH PROPOFOL;  Surgeon: Clarene Essex, MD;  Location: [facility];  Service: Endoscopy;  Laterality: N/A;   ESOPHAGOGASTRODUODENOSCOPY (EGD) WITH PROPOFOL N/A 12/02/2017   Procedure: ESOPHAGOGASTRODUODENOSCOPY (EGD) WITH PROPOFOL;  Surgeon: Clarene Essex, MD;  Location: [place];  Service: Endoscopy;   Laterality: N/A;  have c arm available   ESOPHAGOGASTRODUODENOSCOPY (EGD) WITH PROPOFOL N/A 01/23/2019   Procedure: ESOPHAGOGASTRODUODENOSCOPY (EGD) WITH PROPOFOL;  Surgeon: Clarene Essex, MD;  Location: [facility];  Service: Endoscopy;  Laterality: N/A;   ESOPHAGOGASTRODUODENOSCOPY (EGD) WITH PROPOFOL N/A 07/13/2019   Procedure: ESOPHAGOGASTRODUODENOSCOPY (EGD) WITH PROPOFOL;  Surgeon: Clarene Essex, MD;  Location: [facility];  Service: Endoscopy;  Laterality: N/A;   ESOPHAGOGASTRODUODENOSCOPY (EGD) WITH PROPOFOL N/A 02/02/2020   Procedure: ESOPHAGOGASTRODUODENOSCOPY (EGD) WITH PROPOFOL;  Surgeon: Clarene Essex, MD;  Location: [facility];  Service: Endoscopy;  Laterality: N/A;   ESOPHAGOGASTRODUODENOSCOPY (EGD) WITH PROPOFOL N/A 07/16/2020   Procedure: ESOPHAGOGASTRODUODENOSCOPY (EGD) WITH PROPOFOL;  Surgeon: Clarene Essex, MD;  Location: [facility];  Service: Endoscopy;  Laterality: N/A;   ESOPHAGOGASTRODUODENOSCOPY (EGD) WITH PROPOFOL N/A 02/19/2021   Procedure: ESOPHAGOGASTRODUODENOSCOPY (EGD) WITH PROPOFOL;  Surgeon: Clarene Essex, MD;  Location: [facility];  Service: Endoscopy;  Laterality: N/A;  with possible botox   ESOPHAGOGASTRODUODENOSCOPY (EGD) WITH PROPOFOL N/A 09/30/2021   Procedure: ESOPHAGOGASTRODUODENOSCOPY (EGD) WITH PROPOFOL;  Surgeon: Clarene Essex, MD;  Location: [facility];  Service: Endoscopy;  Laterality: N/A;   ESOPHAGOGASTRODUODENOSCOPY (EGD) WITH PROPOFOL N/A 03/19/2022   Procedure: ESOPHAGOGASTRODUODENOSCOPY (EGD) WITH PROPOFOL;  Surgeon: Clarene Essex, MD;  Location: [facility];  Service: Gastroenterology;  Laterality: N/A;   FOREIGN BODY REMOVAL  07/19/2018   Procedure: FOREIGN BODY REMOVAL;  Surgeon: Clarene Essex, MD;  Location: [facility] END[facility];  Service: Endoscopy;;   FRACTURE SURGERY     Hx: of left wrist 01/2021   GASTRECTOMY  [phone]   GASTRECTOMY N/A    X3   LITHOTRIPSY     Hx; of for kidney stones 23007   SAVORY DILATION N/A 08/10/2014   Procedure:  SAVORY DILATION;  Surgeon: Jeryl Columbia, MD;  Location: [facility] ENDOS[facility];  Service: Endoscopy;  Laterality: N/A;   TOTAL HIP ARTHROPLASTY Right 12/30/2017   Procedure: RIGHT TOTAL HIP ARTHROPLASTY;  Surgeon: Carole Civil, MD;  Location: [facility];  Service: Orthopedics;  Laterality: Right;   TOTAL HIP ARTHROPLASTY Left 02/17/2018   Procedure: TOTAL HIP ARTHROPLASTY;  Surgeon: Carole Civil, MD;  Location: [facility];  Service: Orthopedics;  Laterality: Left;   TUBAL LIGATION     1975    Social History   Socioeconomic History   Marital status: Widowed    Spouse name: Not on file   Number of children: 1   Years of education: Not on file   Highest education level: Not on file  Occupational History   Not on file  Tobacco Use   Smoking status: Never   Smokeless tobacco: Never  Vaping Use   Vaping Use: Never used  Substance and Sexual Activity   Alcohol use: No   Drug use: No   Sexual activity: Not Currently  Other Topics Concern   Not on file  Social History Narrative   1 son-[name], lives next door.   Widowed since 2002   Social Determinants of Health   Financial Resource Strain: Low Risk  (10/08/2021)   Overall Financial Resource Strain (CARDIA)    Difficulty of Paying Living Expenses: Not  hard at all  Food Insecurity: No Food Insecurity (10/08/2021)   Hunger Vital Sign    Worried About Running Out of Food in the Last Year: Never true    Ran Out of Food in the Last Year: Never true  Transportation Needs: No Transportation Needs (10/08/2021)   PRAPARE - Hydrologist (Medical): No    Lack of Transportation (Non-Medical): No  Physical Activity: Sufficiently Active (10/08/2021)   Exercise Vital Sign    Days of Exercise per Week: 5 days    Minutes of Exercise per Session: 30 min  Stress: No Stress Concern Present (10/08/2021)   [place]    Feeling of Stress : Not at all  Social  Connections: Moderately Integrated (10/08/2021)   Social Connection and Isolation Panel [NHANES]    Frequency of Communication with Friends and Family: More than three times a week    Frequency of Social Gatherings with Friends and Family: More than three times a week    Attends Religious Services: More than 4 times per year    Active Member of [employer] or Organizations: Yes    Attends Archivist Meetings: More than 4 times per year    Marital Status: Widowed  Intimate Partner Violence: Not At Risk (10/08/2021)   Humiliation, Afraid, Rape, and Kick questionnaire    Fear of Current or Ex-Partner: No    Emotionally Abused: No    Physically Abused: No    Sexually Abused: No    Outpatient Encounter Medications as of 04/22/2022  Medication Sig   dextromethorphan (DELSYM) 30 MG/5ML liquid Take 2.5 mLs (15 mg total) by mouth as needed for cough.   guaiFENesin (MUCINEX) 600 MG 12 hr tablet Take 1 tablet (600 mg total) by mouth 2 (two) times daily for 10 days.   acetaminophen (TYLENOL) 500 MG tablet Take 500 mg by mouth 2 (two) times daily.   albuterol (VENTOLIN HFA) 108 (90 Base) MCG/ACT inhaler USE 2 PUFFS EVERY 6 HOURS AS NEEDED (Patient taking differently: Inhale 2 puffs into the lungs every 6 (six) hours as needed for wheezing or shortness of breath.)   ALPRAZolam (XANAX) 0.5 MG tablet Take 1 tablet (0.5 mg total) by mouth 4 (four) times daily as needed for anxiety.   Ascorbic Acid (VITAMIN C) 1000 MG tablet Take 1,000 mg by mouth daily.   Biotin 10 MG CAPS Take 10 mg by mouth daily.   BLACK ELDERBERRY PO Take 50 mg by mouth daily.   calcium citrate (CALCITRATE - DOSED IN MG ELEMENTAL CALCIUM) 950 (200 Ca) MG tablet Take 2 tablets by mouth 2 (two) times daily.   Cholecalciferol (VITAMIN D) 2000 units tablet Take 2,000 Units by mouth 2 (two) times daily.   diclofenac Sodium (VOLTAREN) 1 % GEL APPLY 4 GRAMS 4 TIMES DAILY (Patient taking differently: 2 g daily as needed (Hip pain).)    docusate sodium (COLACE) 100 MG capsule Take 1 capsule (100 mg total) by mouth 2 (two) times daily.   erythromycin base (E-MYCIN) 500 MG tablet 1 po 1 hour prior to dental procedure and 1 po after procedure   folic acid (FOLVITE) 409 MCG tablet Take 1 tablet (800 mcg total) by mouth daily.   levothyroxine (SYNTHROID) 100 MCG tablet Take 1 tablet (100 mcg total) by mouth daily.   loratadine (CLARITIN) 10 MG tablet TAKE ONE (1) TABLET EACH DAY (Patient taking differently: Take 10 mg by mouth daily.)   MAGNESIUM  CITRATE PO Take 500 mg by mouth 2 (two) times daily.   metroNIDAZOLE (METROGEL) 0.75 % gel Apply topically 2 (two) times daily.   Multiple Vitamins-Minerals (CENTRUM SILVER) CHEW Chew 1 tablet by mouth 2 (two) times daily.   omeprazole (PRILOSEC) 40 MG capsule Take 1 capsule (40 mg total) by mouth daily.   ondansetron (ZOFRAN-ODT) 4 MG disintegrating tablet TAKE 1 TABLET EVERY 8 HOURS AS NEEDED FOR NAUSEA AND VOMITING   Polyethyl Glycol-Propyl Glycol (SYSTANE OP) Place 1 drop into both eyes daily as needed (dry eyes).   Probiotic Product (PROBIOTIC PO) Take 1 capsule by mouth daily.    sertraline (ZOLOFT) 100 MG tablet Take 2 tablets (200 mg total) by mouth daily. (Patient taking differently: Take 100 mg by mouth at bedtime.)   sucralfate (CARAFATE) 1 g tablet TAKE ONE TABLET FOUR TIMES A DAY BEFORE MEALS AND AT BEDTIME   Turmeric Curcumin 500 MG CAPS Take 500 mg by mouth daily.   No facility-administered encounter medications on file as of 04/22/2022.    Allergies  Allergen Reactions   Augmentin [Amoxicillin-Pot Clavulanate] Other (See Comments)    Bloody stool Did it involve swelling of the face/tongue/throat, SOB, or low BP? No Did it involve sudden or severe rash/hives, skin peeling, or any reaction on the inside of your mouth or nose? No Did you need to seek medical attention at a hospital or doctor's office? No When did it last happen?      1 year If all above answers are "NO",  may proceed with cephalosporin use.    Famotidine Other (See Comments)    Fever    Klonopin [Clonazepam] Other (See Comments)    double vision   Other Other (See Comments)   Nitrofurantoin Rash   Reglan [Metoclopramide] Anxiety and Other (See Comments)    Causes confusion   Sulfamethoxazole-Trimethoprim Rash    Review of Systems  Constitutional:  Negative for activity change, appetite change, chills, diaphoresis, fatigue, fever and unexpected weight change.  HENT:  Positive for congestion, postnasal drip, rhinorrhea and sore throat. Negative for dental problem, drooling, ear discharge, ear pain, facial swelling, hearing loss, mouth sores, nosebleeds, sinus pressure, sinus pain, sneezing, trouble swallowing and voice change.   Eyes:  Negative for photophobia and visual disturbance.  Respiratory:  Positive for cough. Negative for apnea, choking, chest tightness, shortness of breath, wheezing and stridor.   Cardiovascular:  Negative for chest pain.  Gastrointestinal:  Negative for abdominal pain, diarrhea, nausea and vomiting.  Genitourinary:  Negative for decreased urine volume, difficulty urinating and dysuria.  Musculoskeletal:  Negative for joint pain and neck pain.  Skin:  Negative for rash.  Neurological:  Negative for weakness and headaches.  Psychiatric/Behavioral:  Negative for confusion.   All other systems reviewed and are negative.        Observations/Objective: No vital signs or physical exam, this was a virtual health encounter.  Pt alert and oriented, answers all questions appropriately, and able to speak in full sentences.    Assessment and Plan: Janavia was seen today for uri.  Diagnoses and all orders for this visit:  URI with cough and congestion Testing for viral illness pending. Will initiate antiviral therapy if warranted. No indications of acute bacterial illness. Symptomatic care discussed in detail. Medications as prescribed. Repot new, worsening, and  persistent symptoms.  -     COVID-19, Flu A+B and RSV -     guaiFENesin (MUCINEX) 600 MG 12 hr tablet; Take 1 tablet (600 mg  total) by mouth 2 (two) times daily for 10 days. -     dextromethorphan (DELSYM) 30 MG/5ML liquid; Take 2.5 mLs (15 mg total) by mouth as needed for cough.     Follow Up Instructions: Return if symptoms worsen or fail to improve.    I discussed the assessment and treatment plan with the patient. The patient was provided an opportunity to ask questions and all were answered. The patient agreed with the plan and demonstrated an understanding of the instructions.   The patient was advised to call back or seek an in-person evaluation if the symptoms worsen or if the condition fails to improve as anticipated.  The above assessment and management plan was discussed with the patient. The patient verbalized understanding of and has agreed to the management plan. Patient is aware to call the clinic if they develop any new symptoms or if symptoms persist or worsen. Patient is aware when to return to the clinic for a follow-up visit. Patient educated on when it is appropriate to go to the emergency department.    I provided 15 minutes of time during this telephone encounter.   Monia Pouch, FNP-C [facility] [address] [place], [place] [address] [phone] 04/22/2022

## 2022-04-23 LAB — COVID-19, FLU A+B AND RSV
Influenza A, NAA: NOT DETECTED
Influenza B, NAA: NOT DETECTED
RSV, NAA: NOT DETECTED
SARS-CoV-2, NAA: NOT DETECTED

## 2022-04-26 ENCOUNTER — Other Ambulatory Visit: Payer: Self-pay | Admitting: Nurse Practitioner

## 2022-04-27 ENCOUNTER — Ambulatory Visit (HOSPITAL_COMMUNITY): Payer: PPO

## 2022-04-27 ENCOUNTER — Ambulatory Visit: Payer: PPO | Admitting: Urology

## 2022-05-04 ENCOUNTER — Ambulatory Visit (HOSPITAL_COMMUNITY): Payer: PPO

## 2022-05-05 ENCOUNTER — Ambulatory Visit (HOSPITAL_COMMUNITY)
Admission: RE | Admit: 2022-05-05 | Discharge: 2022-05-05 | Disposition: A | Discharge status: Home / Self Care | Payer: PPO | Source: Ambulatory Visit | Attending: Urology | Admitting: Urology

## 2022-05-05 DIAGNOSIS — K5730 Diverticulosis of large intestine without perforation or abscess without bleeding: Secondary | ICD-10-CM | POA: Diagnosis not present

## 2022-05-05 DIAGNOSIS — N200 Calculus of kidney: Secondary | ICD-10-CM | POA: Diagnosis not present

## 2022-05-05 DIAGNOSIS — I700 Atherosclerosis of aorta: Secondary | ICD-10-CM | POA: Diagnosis not present

## 2022-05-05 DIAGNOSIS — Z9049 Acquired absence of other specified parts of digestive tract: Secondary | ICD-10-CM | POA: Diagnosis not present

## 2022-05-11 ENCOUNTER — Other Ambulatory Visit: Payer: Self-pay | Admitting: Orthopedic Surgery

## 2022-05-24 ENCOUNTER — Other Ambulatory Visit: Payer: Self-pay | Admitting: Nurse Practitioner

## 2022-05-24 DIAGNOSIS — F411 Generalized anxiety disorder: Secondary | ICD-10-CM

## 2022-05-26 ENCOUNTER — Ambulatory Visit (INDEPENDENT_AMBULATORY_CARE_PROVIDER_SITE_OTHER): Payer: PPO

## 2022-05-26 ENCOUNTER — Ambulatory Visit (INDEPENDENT_AMBULATORY_CARE_PROVIDER_SITE_OTHER): Payer: PPO | Admitting: Nurse Practitioner

## 2022-05-26 ENCOUNTER — Encounter: Payer: Self-pay | Admitting: Nurse Practitioner

## 2022-05-26 VITALS — BP 108/66 | HR 67 | Temp 97.8°F | Resp 20 | Ht 65.0 in | Wt 102.0 lb

## 2022-05-26 DIAGNOSIS — M810 Age-related osteoporosis without current pathological fracture: Secondary | ICD-10-CM

## 2022-05-26 DIAGNOSIS — D500 Iron deficiency anemia secondary to blood loss (chronic): Secondary | ICD-10-CM

## 2022-05-26 DIAGNOSIS — K311 Adult hypertrophic pyloric stenosis: Secondary | ICD-10-CM

## 2022-05-26 DIAGNOSIS — Z681 Body mass index (BMI) 19 or less, adult: Secondary | ICD-10-CM

## 2022-05-26 DIAGNOSIS — F411 Generalized anxiety disorder: Secondary | ICD-10-CM

## 2022-05-26 DIAGNOSIS — K5901 Slow transit constipation: Secondary | ICD-10-CM

## 2022-05-26 DIAGNOSIS — R1311 Dysphagia, oral phase: Secondary | ICD-10-CM

## 2022-05-26 DIAGNOSIS — K219 Gastro-esophageal reflux disease without esophagitis: Secondary | ICD-10-CM

## 2022-05-26 DIAGNOSIS — E034 Atrophy of thyroid (acquired): Secondary | ICD-10-CM

## 2022-05-26 MED ORDER — OMEPRAZOLE 40 MG PO CPDR: 40.0000 mg | DELAYED_RELEASE_CAPSULE | Freq: Every day | ORAL | 1 refills | Status: DC

## 2022-05-26 MED ORDER — ALPRAZOLAM 0.5 MG PO TABS: 0.5000 mg | ORAL_TABLET | Freq: Four times a day (QID) | ORAL | 5 refills | Status: DC | PRN

## 2022-05-26 MED ORDER — SERTRALINE HCL 100 MG PO TABS: ORAL_TABLET | ORAL | 1 refills | Status: DC

## 2022-05-26 MED ORDER — SUCRALFATE 1 G PO TABS: ORAL_TABLET | ORAL | 1 refills | Status: DC

## 2022-05-26 MED ORDER — FOLIC ACID 800 MCG PO TABS: 800.0000 ug | ORAL_TABLET | Freq: Every day | ORAL | 1 refills | Status: DC

## 2022-05-26 NOTE — Patient Instructions (Signed)
Bone Health Bones protect organs, store calcium, anchor muscles, and support the whole body. Keeping your bones strong is important, especially as you get older. You can take actions to help keep your bones strong and healthy. Why is keeping my bones healthy important?  Keeping your bones healthy is important because your body constantly replaces bone cells. Cells get old, and new cells take their place. As we age, we lose bone cells because the body may not be able to make enough new cells to replace the old cells. The amount of bone cells and bone tissue you have is referred to as bone mass. The higher your bone mass, the stronger your bones. The aging process leads to an overall loss of bone mass in the body, which can increase the likelihood of: Broken bones. A condition in which the bones become weak and brittle (osteoporosis). A large decline in bone mass occurs in older adults. In women, it occurs about the time of menopause. What actions can I take to keep my bones healthy? Good health habits are important for maintaining healthy bones. This includes eating nutritious foods and exercising regularly. To have healthy bones, you need to get enough of the right minerals and vitamins. Most nutrition experts recommend getting these nutrients from the foods that you eat. In some cases, taking supplements may also be recommended. Doing certain types of exercise is also important for bone health. What are the nutritional recommendations for healthy bones?  Eating a well-balanced diet with plenty of calcium and vitamin D will help to protect your bones. Nutritional recommendations vary from person to person. Ask your health care provider what is healthy for you. Here are some general guidelines. Get enough calcium Calcium is the most important (essential) mineral for bone health. Most people can get enough calcium from their diet, but supplements may be recommended for people who are at risk for  osteoporosis. Good sources of calcium include: Dairy products, such as low-fat or nonfat milk, cheese, and yogurt. Dark green leafy vegetables, such as bok choy and broccoli. Foods that have calcium added to them (are fortified). Foods that may be fortified with calcium include orange juice, cereal, bread, soy beverages, and tofu products. Nuts, such as almonds. Follow these recommended amounts for daily calcium intake: Infants, 0-6 months: 200 mg. Infants, 6-12 months: 260 mg. Children, age 1-3: 700 mg. Children, age 4-8: 1,000 mg. Children, age 9-13: 1,300 mg. Teens, age 14-18: 1,300 mg. Adults, age 19-50: 1,000 mg. Adults, age 51-70: Men: 1,000 mg. Women: 1,200 mg. Adults, age 71 or older: 1,200 mg. Pregnant and breastfeeding females: Teens: 1,300 mg. Adults: 1,000 mg. Get enough vitamin D Vitamin D is the most essential vitamin for bone health. It helps the body absorb calcium. Sunlight stimulates the skin to make vitamin D, so be sure to get enough sunlight. If you live in a cold climate or you do not get outside often, your health care provider may recommend that you take vitamin D supplements. Good sources of vitamin D in your diet include: Egg yolks. Saltwater fish. Milk and cereal fortified with vitamin D. Follow these recommended amounts for daily vitamin D intake: Infants, 0-12 months: 400 international units (IU). Children and teens, age 1-18: 600 international units. Adults, age 59 or younger: 600 international units. Adults, age 60 or older: 600-1,000 international units. Get other important nutrients Other nutrients that are important for bone health include: Phosphorus. This mineral is found in meat, poultry, dairy foods, nuts, and legumes. The   recommended daily intake for adult men and adult women is 700 mg. Magnesium. This mineral is found in seeds, nuts, dark green vegetables, and legumes. The recommended daily intake for adult men is 400-420 mg. For adult women,  it is 310-320 mg. Vitamin K. This vitamin is found in green leafy vegetables. The recommended daily intake is 120 mcg for adult men and 90 mcg for adult women. What type of physical activity is best for building and maintaining healthy bones? Weight-bearing and strength-building activities are important for building and maintaining healthy bones. Weight-bearing activities cause muscles and bones to work against gravity. Strength-building activities increase the strength of the muscles that support bones. Weight-bearing and muscle-building activities include: Walking and hiking. Jogging and running. Dancing. Gym exercises. Lifting weights. Tennis and racquetball. Climbing stairs. Aerobics. Adults should get at least 30 minutes of moderate physical activity on most days. Children should get at least 60 minutes of moderate physical activity on most days. Ask your health care provider what type of exercise is best for you. How can I find out if my bone mass is low? Bone mass can be measured with an X-ray test called a bone mineral density (BMD) test. This test is recommended for all women who are age 65 or older. It may also be recommended for: Men who are age 70 or older. People who are at risk for osteoporosis because of: Having a long-term disease that weakens bones, such as kidney disease or rheumatoid arthritis. Having menopause earlier than normal. Taking medicine that weakens bones, such as steroids, thyroid hormones, or hormone treatment for breast cancer or prostate cancer. Smoking. Drinking three or more alcoholic drinks a day. Being underweight. Sedentary lifestyle. If you find that you have a low bone mass, you may be able to prevent osteoporosis or further bone loss by changing your diet and lifestyle. Where can I find more information? Bone Health & Osteoporosis Foundation: www.nof.org/patients National Institutes of Health: www.bones.nih.gov International Osteoporosis  Foundation: www.iofbonehealth.org Summary The aging process leads to an overall loss of bone mass in the body, which can increase the likelihood of broken bones and osteoporosis. Eating a well-balanced diet with plenty of calcium and vitamin D will help to protect your bones. Weight-bearing and strength-building activities are also important for building and maintaining strong bones. Bone mass can be measured with an X-ray test called a bone mineral density (BMD) test. This information is not intended to replace advice given to you by your health care provider. Make sure you discuss any questions you have with your health care provider. Document Revised: 01/01/2021 Document Reviewed: 01/01/2021 Elsevier Patient Education  2023 Elsevier Inc.  

## 2022-05-26 NOTE — Progress Notes (Signed)
Subjective:    Patient ID: KOYA HUNGER, female    DOB: 09/23/1950, 71 y.o.   MRN: [id number]   Chief Complaint: Medical Management of Chronic Issues    HPI:  JAYLYNNE BIRKHEAD is a 71 y.o. who identifies as a female who was assigned female at birth.   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 Gastric outlet obstruction Sees Dr. Watt Climes; had another balloon dilation done 2 months ago.  2. Oral phase dysphagia Swallowing doing ok  3. Gastroesophageal reflux disease without esophagitis Remains on omeprazole and carafate daily which works well for her  4. Slow transit constipation Doing better; having daily bowel movements most of the time  5. Hypothyroidism due to acquired atrophy of thyroid No issues she is aware of Lab Results  Component Value Date   TSH 0.866 02/17/2022   T4TOTAL 5.0 02/17/2022     6. Osteoporosis without pathological fracture Exercises twice a day since bilat hip replacements. Dexa scheduled for next month  7. Iron deficiency anemia due to chronic blood loss No fatigue, sob, dizziness.  Lab Results  Component Value Date   HGB 12.8 02/20/2022    8. GAD (generalized anxiety disorder) Remains on zoloft daily and PRN xanax which works well for her.    05/26/2022    9:58 AM 11/24/2021    9:58 AM 07/24/2021    8:24 AM 05/22/2021   10:16 AM  GAD 7 : Generalized Anxiety Score  Nervous, Anxious, on Edge 0 0 0 0  Control/stop worrying 0 0 0 0  Worry too much - different things 0 0 0 0  Trouble relaxing 0 0 0 0  Restless 0 0 0 0  Easily annoyed or irritable 0 0 0 0  Afraid - awful might happen 0 0 0 0  Total GAD 7 Score 0 0 0 0  Anxiety Difficulty Not difficult at all Not difficult at all Not difficult at all       9. BMI less than 19,adult Weight is down 3 lbs since last check Wt Readings from Last 3 Encounters:  05/26/22 102 lb (46.3 kg)   03/19/22 105 lb (47.6 kg)  11/24/21 104 lb (47.2 kg)   BMI Readings from Last 3 Encounters:  05/26/22 16.97 kg/m  03/19/22 17.47 kg/m  11/24/21 17.31 kg/m       New complaints: None today  Allergies  Allergen Reactions   Augmentin [Amoxicillin-Pot Clavulanate] Other (See Comments)    Bloody stool Did it involve swelling of the face/tongue/throat, SOB, or low BP? No Did it involve sudden or severe rash/hives, skin peeling, or any reaction on the inside of your mouth or nose? No Did you need to seek medical attention at a hospital or doctor's office? No When did it last happen?      1 year If all above answers are "NO", may proceed with cephalosporin use.    Famotidine Other (See Comments)    Fever    Klonopin [Clonazepam] Other (See Comments)    double vision   Other Other (See Comments)   Nitrofurantoin Rash   Reglan [Metoclopramide] Anxiety and Other (See Comments)    Causes confusion   Sulfamethoxazole-Trimethoprim Rash   Outpatient Encounter Medications as of 05/26/2022  Medication Sig   acetaminophen (TYLENOL) 500 MG tablet Take 500 mg by mouth 2 (two) times daily.   albuterol (VENTOLIN HFA) 108 (90 Base)  MCG/ACT inhaler USE 2 PUFFS EVERY 6 HOURS AS NEEDED   ALPRAZolam (XANAX) 0.5 MG tablet Take 1 tablet (0.5 mg total) by mouth 4 (four) times daily as needed for anxiety.   Ascorbic Acid (VITAMIN C) 1000 MG tablet Take 1,000 mg by mouth daily.   Biotin 10 MG CAPS Take 10 mg by mouth daily.   BLACK ELDERBERRY PO Take 50 mg by mouth daily.   calcium citrate (CALCITRATE - DOSED IN MG ELEMENTAL CALCIUM) 950 (200 Ca) MG tablet Take 2 tablets by mouth 2 (two) times daily.   Cholecalciferol (VITAMIN D) 2000 units tablet Take 2,000 Units by mouth 2 (two) times daily.   diclofenac Sodium (VOLTAREN) 1 % GEL Apply 4 g topically daily as needed (Hip pain).   docusate sodium (COLACE) 100 MG capsule Take 1 capsule (100 mg total) by mouth 2 (two) times daily.   folic acid  (FOLVITE) 481 MCG tablet Take 1 tablet (800 mcg total) by mouth daily.   levothyroxine (SYNTHROID) 100 MCG tablet Take 1 tablet (100 mcg total) by mouth daily.   loratadine (CLARITIN) 10 MG tablet TAKE ONE (1) TABLET EACH DAY (Patient taking differently: Take 10 mg by mouth daily.)   MAGNESIUM CITRATE PO Take 500 mg by mouth 2 (two) times daily.   Multiple Vitamins-Minerals (CENTRUM SILVER) CHEW Chew 1 tablet by mouth 2 (two) times daily.   omeprazole (PRILOSEC) 40 MG capsule Take 1 capsule (40 mg total) by mouth daily.   ondansetron (ZOFRAN-ODT) 4 MG disintegrating tablet TAKE 1 TABLET EVERY 8 HOURS AS NEEDED FOR NAUSEA AND VOMITING   Polyethyl Glycol-Propyl Glycol (SYSTANE OP) Place 1 drop into both eyes daily as needed (dry eyes).   Probiotic Product (PROBIOTIC PO) Take 1 capsule by mouth daily.    sertraline (ZOLOFT) 100 MG tablet TAKE TWO (2) TABLETS BY MOUTH DAILY   sucralfate (CARAFATE) 1 g tablet TAKE ONE TABLET FOUR TIMES A DAY BEFORE MEALS AND AT BEDTIME   Turmeric Curcumin 500 MG CAPS Take 500 mg by mouth daily.   [DISCONTINUED] metroNIDAZOLE (METROGEL) 0.75 % gel Apply topically 2 (two) times daily.   dextromethorphan (DELSYM) 30 MG/5ML liquid Take 2.5 mLs (15 mg total) by mouth as needed for cough. (Patient not taking: Reported on 05/26/2022)   erythromycin base (E-MYCIN) 500 MG tablet 1 po 1 hour prior to dental procedure and 1 po after procedure (Patient not taking: Reported on 05/26/2022)   No facility-administered encounter medications on file as of 05/26/2022.    Past Surgical History:  Procedure Laterality Date   APPENDECTOMY     BALLOON DILATION  06/16/2011   Procedure: BALLOON DILATION;  Surgeon: Jeryl Columbia, MD;  Location: [facility] ENDOSCOPY;  Service: Endoscopy;  Laterality: N/A;   BALLOON DILATION N/A 10/03/2013   Procedure: BALLOON DILATION;  Surgeon: Jeryl Columbia, MD;  Location: [facility] ENDOSCOPY;  Service: Endoscopy;  Laterality: N/A;   BALLOON DILATION N/A 08/10/2014    Procedure: BALLOON DILATION;  Surgeon: Jeryl Columbia, MD;  Location: [facility]COPY;  Service: Endoscopy;  Laterality: N/A;  from 12 - 15 cm dilation completed   BALLOON DILATION N/A 02/15/2015   Procedure: BALLOON DILATION;  Surgeon: Clarene Essex, MD;  Location: [facility]OSCOPY;  Service: Endoscopy;  Laterality: N/A;   BALLOON DILATION N/A 11/12/2015   Procedure: BALLOON DILATION;  Surgeon: Clarene Essex, MD;  Location: [facility];  Service: Endoscopy;  Laterality: N/A;   BALLOON DILATION N/A 08/10/2016   Procedure: BALLOON DILATION;  Surgeon: Clarene Essex, MD;  Location: [facility] ENDOSCOPY;  Service: Endoscopy;  Laterality: N/A;   BALLOON DILATION N/A 08/07/2016   Procedure: BALLOON DILATION;  Surgeon: Clarene Essex, MD;  Location: [facility] ENDOSCOPY;  Service: Endoscopy;  Laterality: N/A;   BALLOON DILATION N/A 05/20/2017   Procedure: BALLOON DILATION;  Surgeon: Clarene Essex, MD;  Location: [facility];  Service: Endoscopy;  Laterality: N/A;   BALLOON DILATION N/A 12/02/2017   Procedure: BALLOON DILATION;  Surgeon: Clarene Essex, MD;  Location: [place];  Service: Endoscopy;  Laterality: N/A;   BALLOON DILATION N/A 07/19/2018   Procedure: BALLOON DILATION;  Surgeon: Clarene Essex, MD;  Location: [facility];  Service: Endoscopy;  Laterality: N/A;   BALLOON DILATION N/A 01/23/2019   Procedure: BALLOON DILATION;  Surgeon: Clarene Essex, MD;  Location: [facility];  Service: Endoscopy;  Laterality: N/A;   BALLOON DILATION N/A 07/13/2019   Procedure: BALLOON DILATION;  Surgeon: Clarene Essex, MD;  Location: [facility];  Service: Endoscopy;  Laterality: N/A;   BALLOON DILATION N/A 02/02/2020   Procedure: BALLOON DILATION;  Surgeon: Clarene Essex, MD;  Location: [facility];  Service: Endoscopy;  Laterality: N/A;   BALLOON DILATION N/A 07/16/2020   Procedure: BALLOON DILATION;  Surgeon: Clarene Essex, MD;  Location: [facility];  Service: Endoscopy;  Laterality: N/A;   BALLOON DILATION N/A 02/19/2021   Procedure: BALLOON  DILATION;  Surgeon: Clarene Essex, MD;  Location: [facility];  Service: Endoscopy;  Laterality: N/A;   BALLOON DILATION N/A 09/30/2021   Procedure: BALLOON DILATION;  Surgeon: Clarene Essex, MD;  Location: [facility];  Service: Endoscopy;  Laterality: N/A;   BALLOON DILATION N/A 03/19/2022   Procedure: BALLOON DILATION;  Surgeon: Clarene Essex, MD;  Location: [facility];  Service: Gastroenterology;  Laterality: N/A;   CATARACT EXTRACTION, BILATERAL     2019   CHOLECYSTECTOMY OPEN  1979   COLONOSCOPY     Hx: of   CYSTOSCOPY/URETEROSCOPY/HOLMIUM LASER/STENT PLACEMENT Bilateral 05/06/2021   Procedure: CYSTOSCOPY BILATERAL RETROGRADE LEFT URETEROSCOPY/HOLMIUM LASER/STENT PLACEMENT;  Surgeon: [name], MD;  Location: [facility];  Service: Urology;  Laterality: Bilateral;   ESOPHAGOGASTRODUODENOSCOPY  06/16/2011   Procedure: ESOPHAGOGASTRODUODENOSCOPY (EGD);  Surgeon: Jeryl Columbia, MD;  Location: [facility] ENDOSCOPY;  Service: Endoscopy;  Laterality: N/A;   ESOPHAGOGASTRODUODENOSCOPY N/A 10/03/2013   Procedure: ESOPHAGOGASTRODUODENOSCOPY (EGD);  Surgeon: Jeryl Columbia, MD;  Location: [facility];  Service: Endoscopy;  Laterality: N/A;   ESOPHAGOGASTRODUODENOSCOPY N/A 08/10/2014   Procedure: ESOPHAGOGASTRODUODENOSCOPY (EGD);  Surgeon: Jeryl Columbia, MD;  Location: [facility] ENDOSCOPY;  Service: Endoscopy;  Laterality: N/A;   ESOPHAGOGASTRODUODENOSCOPY N/A 08/10/2016   Procedure: ESOPHAGOGASTRODUODENOSCOPY (EGD);  Surgeon: Clarene Essex, MD;  Location: [facility] ENDOSCOPY;  Service: Endoscopy;  Laterality: N/A;   ESOPHAGOGASTRODUODENOSCOPY N/A 07/19/2018   Procedure: ESOPHAGOGASTRODUODENOSCOPY (EGD);  Surgeon: Clarene Essex, MD;  Location: [facility]OS[facility];  Service: Endoscopy;  Laterality: N/A;   ESOPHAGOGASTRODUODENOSCOPY (EGD) WITH PROPOFOL N/A 02/15/2015   Procedure: ESOPHAGOGASTRODUODENOSCOPY (EGD) WITH PROPOFOL;  Surgeon: Clarene Essex, MD;  Location: [facility];  Service: Endoscopy;  Laterality: N/A;    ESOPHAGOGASTRODUODENOSCOPY (EGD) WITH PROPOFOL N/A 11/12/2015   Procedure: ESOPHAGOGASTRODUODENOSCOPY (EGD) WITH PROPOFOL;  Surgeon: Clarene Essex, MD;  Location: [facility]OS[facility];  Service: Endoscopy;  Laterality: N/A;   ESOPHAGOGASTRODUODENOSCOPY (EGD) WITH PROPOFOL N/A 08/07/2016   Procedure: ESOPHAGOGASTRODUODENOSCOPY (EGD) WITH PROPOFOL;  Surgeon: Clarene Essex, MD;  Location: [facility] ENDOSCOPY;  Service: Endoscopy;  Laterality: N/A;   ESOPHAGOGASTRODUODENOSCOPY (EGD) WITH PROPOFOL N/A 05/20/2017   Procedure: ESOPHAGOGASTRODUODENOSCOPY (EGD) WITH PROPOFOL;  Surgeon: Clarene Essex, MD;  Location: [facility];  Service:  Endoscopy;  Laterality: N/A;   ESOPHAGOGASTRODUODENOSCOPY (EGD) WITH PROPOFOL N/A 12/02/2017   Procedure: ESOPHAGOGASTRODUODENOSCOPY (EGD) WITH PROPOFOL;  Surgeon: Clarene Essex, MD;  Location: [place];  Service: Endoscopy;  Laterality: N/A;  have c arm available   ESOPHAGOGASTRODUODENOSCOPY (EGD) WITH PROPOFOL N/A 01/23/2019   Procedure: ESOPHAGOGASTRODUODENOSCOPY (EGD) WITH PROPOFOL;  Surgeon: Clarene Essex, MD;  Location: [facility];  Service: Endoscopy;  Laterality: N/A;   ESOPHAGOGASTRODUODENOSCOPY (EGD) WITH PROPOFOL N/A 07/13/2019   Procedure: ESOPHAGOGASTRODUODENOSCOPY (EGD) WITH PROPOFOL;  Surgeon: Clarene Essex, MD;  Location: [facility];  Service: Endoscopy;  Laterality: N/A;   ESOPHAGOGASTRODUODENOSCOPY (EGD) WITH PROPOFOL N/A 02/02/2020   Procedure: ESOPHAGOGASTRODUODENOSCOPY (EGD) WITH PROPOFOL;  Surgeon: Clarene Essex, MD;  Location: [facility];  Service: Endoscopy;  Laterality: N/A;   ESOPHAGOGASTRODUODENOSCOPY (EGD) WITH PROPOFOL N/A 07/16/2020   Procedure: ESOPHAGOGASTRODUODENOSCOPY (EGD) WITH PROPOFOL;  Surgeon: Clarene Essex, MD;  Location: [facility];  Service: Endoscopy;  Laterality: N/A;   ESOPHAGOGASTRODUODENOSCOPY (EGD) WITH PROPOFOL N/A 02/19/2021   Procedure: ESOPHAGOGASTRODUODENOSCOPY (EGD) WITH PROPOFOL;  Surgeon: Clarene Essex, MD;  Location: [facility];  Service:  Endoscopy;  Laterality: N/A;  with possible botox   ESOPHAGOGASTRODUODENOSCOPY (EGD) WITH PROPOFOL N/A 09/30/2021   Procedure: ESOPHAGOGASTRODUODENOSCOPY (EGD) WITH PROPOFOL;  Surgeon: Clarene Essex, MD;  Location: [facility];  Service: Endoscopy;  Laterality: N/A;   ESOPHAGOGASTRODUODENOSCOPY (EGD) WITH PROPOFOL N/A 03/19/2022   Procedure: ESOPHAGOGASTRODUODENOSCOPY (EGD) WITH PROPOFOL;  Surgeon: Clarene Essex, MD;  Location: [facility] ENDOS[facility];  Service: Gastroenterology;  Laterality: N/A;   FOREIGN BODY REMOVAL  07/19/2018   Procedure: FOREIGN BODY REMOVAL;  Surgeon: Clarene Essex, MD;  Location: [facility] ENDOS[facility];  Service: Endoscopy;;   FRACTURE SURGERY     Hx: of left wrist 01/2021   GASTRECTOMY  [phone]   GASTRECTOMY N/A    X3   LITHOTRIPSY     Hx; of for kidney stones 23007   SAVORY DILATION N/A 08/10/2014   Procedure: SAVORY DILATION;  Surgeon: Jeryl Columbia, MD;  Location: [facility] ENDOS[facility];  Service: Endoscopy;  Laterality: N/A;   TOTAL HIP ARTHROPLASTY Right 12/30/2017   Procedure: RIGHT TOTAL HIP ARTHROPLASTY;  Surgeon: Carole Civil, MD;  Location: [facility];  Service: Orthopedics;  Laterality: Right;   TOTAL HIP ARTHROPLASTY Left 02/17/2018   Procedure: TOTAL HIP ARTHROPLASTY;  Surgeon: Carole Civil, MD;  Location: [facility];  Service: Orthopedics;  Laterality: Left;   TUBAL LIGATION     1975    Family History  Problem Relation Age of Onset   Diabetes Mother    Cancer - Prostate Brother       Controlled substance contract: 05/26/22     Review of Systems  Constitutional:  Negative for appetite change and fatigue.  Eyes:  Negative for pain.  Respiratory:  Negative for chest tightness and shortness of breath.   Cardiovascular:  Negative for chest pain, palpitations and leg swelling.  Gastrointestinal:  Negative for abdominal pain.  Endocrine: Negative for polydipsia and polyphagia.  Genitourinary:  Negative for difficulty urinating.  Neurological:  Negative for  dizziness, weakness and headaches.  Hematological:  Negative for adenopathy. Does not bruise/bleed easily.  All other systems reviewed and are negative.      Objective:   Physical Exam Vitals and nursing note reviewed.  Constitutional:      General: She is not in acute distress.    Appearance: Normal appearance. She is underweight. She is not toxic-appearing.  HENT:     Head: Normocephalic and atraumatic.     Right Ear: Tympanic membrane normal.  Left Ear: Tympanic membrane normal.     Nose: Nose normal.     Mouth/Throat:     Mouth: Mucous membranes are moist.     Pharynx: Oropharynx is clear.  Eyes:     Conjunctiva/sclera: Conjunctivae normal.     Pupils: Pupils are equal, round, and reactive to light.  Neck:     Vascular: No JVD.  Cardiovascular:     Rate and Rhythm: Normal rate and regular rhythm.     Pulses: Normal pulses.     Heart sounds: Normal heart sounds.  Pulmonary:     Effort: Pulmonary effort is normal. No respiratory distress.     Breath sounds: Normal breath sounds. No wheezing, rhonchi or rales.  Abdominal:     General: Abdomen is flat. Bowel sounds are normal. There is no distension.     Palpations: Abdomen is soft.     Tenderness: There is no abdominal tenderness.  Musculoskeletal:        General: Normal range of motion.     Cervical back: Normal range of motion. No tenderness.     Right lower leg: No edema.     Left lower leg: No edema.  Lymphadenopathy:     Cervical: No cervical adenopathy.  Skin:    General: Skin is warm and dry.     Capillary Refill: Capillary refill takes less than 2 seconds.  Neurological:     General: No focal deficit present.     Mental Status: She is alert and oriented to person, place, and time.  Psychiatric:        Mood and Affect: Mood normal.        Behavior: Behavior normal.   BP 108/66   Pulse 67   Temp 97.8 F (36.6 C) (Temporal)   Resp 20   Ht '5\' 5"'$  (1.651 m)   Wt 102 lb (46.3 kg)   SpO2 98%   BMI  16.97 kg/m          Assessment & Plan:   TAMEA BAI comes in today with chief complaint of Medical Management of Chronic Issues   Diagnosis and orders addressed:  1. Gastric outlet obstruction Eat frequent small meals  2. Oral phase dysphagia Chew food well  3. Gastroesophageal reflux disease without esophagitis Avoid spicy foods Do not eat 2 hours prior to bedtime - omeprazole (PRILOSEC) 40 MG capsule; Take 1 capsule (40 mg total) by mouth daily.  Dispense: 90 capsule; Refill: 1  4. Slow transit constipation Increase fiber in diet  5. Hypothyroidism due to acquired atrophy of thyroid Labs pending  6. Osteoporosis without pathological fracture Weight bearing exercises - DG [facility] DEXA  7. Iron deficiency anemia due to chronic blood loss Labs pending - Ferritin - sucralfate (CARAFATE) 1 g tablet; TAKE ONE TABLET FOUR TIMES A DAY BEFORE MEALS AND AT BEDTIME  Dispense: 120 tablet; Refill: 1 - folic acid (FOLVITE) 829 MCG tablet; Take 1 tablet (800 mcg total) by mouth daily.  Dispense: 90 tablet; Refill: 1  8. GAD (generalized anxiety disorder) Stress management - sertraline (ZOLOFT) 100 MG tablet; TAKE TWO (2) TABLETS BY MOUTH DAILY  Dispense: 180 tablet; Refill: 1 - ALPRAZolam (XANAX) 0.5 MG tablet; Take 1 tablet (0.5 mg total) by mouth 4 (four) times daily as needed for anxiety.  Dispense: 120 tablet; Refill: 5  9. BMI less than 19,adult Discussed diet and exercise for person with BMI >25 Will recheck weight in 3-6 months    Labs pending Health Maintenance reviewed  Diet and exercise encouraged  Follow up plan: 6 months   Mary-Margaret Hassell Done, FNP

## 2022-05-27 LAB — FERRITIN: Ferritin: 257 ng/mL — ABNORMAL HIGH (ref 15–150)

## 2022-05-28 ENCOUNTER — Ambulatory Visit (INDEPENDENT_AMBULATORY_CARE_PROVIDER_SITE_OTHER): Payer: PPO | Admitting: Urology

## 2022-05-28 ENCOUNTER — Encounter: Payer: Self-pay | Admitting: Urology

## 2022-05-28 VITALS — BP 113/65 | HR 85

## 2022-05-28 DIAGNOSIS — N200 Calculus of kidney: Secondary | ICD-10-CM

## 2022-05-28 DIAGNOSIS — M8588 Other specified disorders of bone density and structure, other site: Secondary | ICD-10-CM | POA: Diagnosis not present

## 2022-05-28 DIAGNOSIS — Z780 Asymptomatic menopausal state: Secondary | ICD-10-CM | POA: Diagnosis not present

## 2022-05-28 DIAGNOSIS — Z87440 Personal history of urinary (tract) infections: Secondary | ICD-10-CM

## 2022-05-28 NOTE — Progress Notes (Signed)
Subjective: 1. Kidney stones   2. Personal history of urinary infection     Brittany Hensley is a former patient with a history of stones who I last saw in 2009.  She was in the ER on 04/14/21 with gross hematuria and a CT shows bilateral non-obstructing renal stones with a malrotated right kidney.   There is a partial staghorn stone in the LLP that has multiple small stones making it up.  The stones are larger on the right and distributed throughout the kidney.  The bladder is not well seen because of beam hardening from bilateral hip implants.  She has no other voiding complaints.  Her UA today has >30 RBC with WBC and bacteria as well.   06/12/21: Brittany Hensley returns today in f/u from cystoscopy with bil RTG, left URS with HLL and stent on 05/06/21.   She had multiple LLP stones that were felt to be the most likely cause of the gross hematuria.  She remains on doxycycline for suppression of an enterococcal UTI.  Her UA today shows 6-10 WBC, >30 RBC's and mod bact.    10/23/21: Brittany Hensley returns today in f/u.  She is doing well without flank pain or hematuria.  She has at most mild dysuria.  Her UA today has 11-30 WBC with 3-10 RBC and many bacteria.   She had a KUB that showed stable right renal stones and left renal stones were not clearly seen. The renal US in 11/22 showed bilateral  renal stones without obstruction but the left renal shadows could be artifact as no stones were noted on KUB.  She missed her last appt with COVID.  She has had no further hematuria since her ureteroscopy in 10/22.     05/28/22: Brittany Hensley returns today for her history of stones.  A CT on 05/22/22 shows stable bilateral renal stones, right > left.  The right side is stable since 9/22 and the left has a significant reduction in stone burden since her ureteroscopy in 10/22.  She has had no flank pain or hematuria.   She has had no voiding complaints. Her UA is unremarkable.   ROS:  Review of Systems  Gastrointestinal:  Positive for  constipation and nausea.  Musculoskeletal:  Positive for back pain and joint pain.  The above are only occasional complaints.   Allergies  Allergen Reactions   Augmentin [Amoxicillin-Pot Clavulanate] Other (See Comments)    Bloody stool Did it involve swelling of the face/tongue/throat, SOB, or low BP? No Did it involve sudden or severe rash/hives, skin peeling, or any reaction on the inside of your mouth or nose? No Did you need to seek medical attention at a hospital or doctor's office? No When did it last happen?      1 year If all above answers are "NO", may proceed with cephalosporin use.    Famotidine Other (See Comments)    Fever    Klonopin [Clonazepam] Other (See Comments)    double vision   Other Other (See Comments)   Nitrofurantoin Rash   Reglan [Metoclopramide] Anxiety and Other (See Comments)    Causes confusion   Sulfamethoxazole-Trimethoprim Rash    Past Medical History:  Diagnosis Date   Anemia    iron    Anxiety    Arthritis    COVID    2020 bad cold s/s lasted 1 week. 04/28/2021   Depression    Diverticulitis    Hx: of   GERD (gastroesophageal reflux disease)    at times for  15 yrs 04/28/2021   History of kidney stones    10 years ago   Hypothyroidism    Low iron    Pneumonia    2020   Rosacea    Hx: of   SCC (squamous cell carcinoma) 08/13/2021   in situ-left temple (CX35FU)   SCC (squamous cell carcinoma) 08/13/2021   well diff-right forehead (CX35FU)   Squamous cell carcinoma of skin 08/03/2021   in situ- left malar cheek (CX35FU)    Past Surgical History:  Procedure Laterality Date   APPENDECTOMY     BALLOON DILATION  06/16/2011   Procedure: BALLOON DILATION;  Surgeon: Jeryl Columbia, MD;  Location: [facility] ENDOSCOPY;  Service: Endoscopy;  Laterality: N/A;   BALLOON DILATION N/A 10/03/2013   Procedure: BALLOON DILATION;  Surgeon: Jeryl Columbia, MD;  Location: [facility] ENDOSCOPY;  Service: Endoscopy;  Laterality: N/A;   BALLOON DILATION N/A  08/10/2014   Procedure: BALLOON DILATION;  Surgeon: Jeryl Columbia, MD;  Location: [facility] ENDOSCOPY;  Service: Endoscopy;  Laterality: N/A;  from 12 - 15 cm dilation completed   BALLOON DILATION N/A 02/15/2015   Procedure: BALLOON DILATION;  Surgeon: Clarene Essex, MD;  Location: [facility] ENDOSCOPY;  Service: Endoscopy;  Laterality: N/A;   BALLOON DILATION N/A 11/12/2015   Procedure: BALLOON DILATION;  Surgeon: Clarene Essex, MD;  Location: [facility] ENDOSCOPY;  Service: Endoscopy;  Laterality: N/A;   BALLOON DILATION N/A 08/10/2016   Procedure: BALLOON DILATION;  Surgeon: Clarene Essex, MD;  Location: [facility] ENDOSCOPY;  Service: Endoscopy;  Laterality: N/A;   BALLOON DILATION N/A 08/07/2016   Procedure: BALLOON DILATION;  Surgeon: Clarene Essex, MD;  Location: [facility] ENDOSCOPY;  Service: Endoscopy;  Laterality: N/A;   BALLOON DILATION N/A 05/20/2017   Procedure: BALLOON DILATION;  Surgeon: Clarene Essex, MD;  Location: [facility] ENDOSCOPY;  Service: Endoscopy;  Laterality: N/A;   BALLOON DILATION N/A 12/02/2017   Procedure: BALLOON DILATION;  Surgeon: Clarene Essex, MD;  Location: [place];  Service: Endoscopy;  Laterality: N/A;   BALLOON DILATION N/A 07/19/2018   Procedure: BALLOON DILATION;  Surgeon: Clarene Essex, MD;  Location: [facility]OSCOPY;  Service: Endoscopy;  Laterality: N/A;   BALLOON DILATION N/A 01/23/2019   Procedure: BALLOON DILATION;  Surgeon: Clarene Essex, MD;  Location: [facility] ENDOSCOPY;  Service: Endoscopy;  Laterality: N/A;   BALLOON DILATION N/A 07/13/2019   Procedure: BALLOON DILATION;  Surgeon: Clarene Essex, MD;  Location: [facility]OS[facility];  Service: Endoscopy;  Laterality: N/A;   BALLOON DILATION N/A 02/02/2020   Procedure: BALLOON DILATION;  Surgeon: Clarene Essex, MD;  Location: [facility] ENDOSCOPY;  Service: Endoscopy;  Laterality: N/A;   BALLOON DILATION N/A 07/16/2020   Procedure: BALLOON DILATION;  Surgeon: Clarene Essex, MD;  Location: [facility]OSCOPY;  Service: Endoscopy;  Laterality: N/A;   BALLOON DILATION N/A 02/19/2021    Procedure: BALLOON DILATION;  Surgeon: Clarene Essex, MD;  Location: [facility] ENDOSCOPY;  Service: Endoscopy;  Laterality: N/A;   BALLOON DILATION N/A 09/30/2021   Procedure: BALLOON DILATION;  Surgeon: Clarene Essex, MD;  Location: [facility] ENDOS[facility];  Service: Endoscopy;  Laterality: N/A;   BALLOON DILATION N/A 03/19/2022   Procedure: BALLOON DILATION;  Surgeon: Clarene Essex, MD;  Location: [facility] ENDOSCOPY;  Service: Gastroenterology;  Laterality: N/A;   CATARACT EXTRACTION, BILATERAL     2019   CHOLECYSTECTOMY OPEN  1979   COLONOSCOPY     Hx: of   CYSTOSCOPY/URETEROSCOPY/HOLMIUM LASER/STENT PLACEMENT Bilateral 05/06/2021   Procedure: CYSTOSCOPY BILATERAL RETROGRADE LEFT URETEROSCOPY/HOLMIUM LASER/STENT PLACEMENT;  Surgeon: [name], MD;  Location:  [place];  Service: Urology;  Laterality: Bilateral;   ESOPHAGOGASTRODUODENOSCOPY  06/16/2011   Procedure: ESOPHAGOGASTRODUODENOSCOPY (EGD);  Surgeon: Jeryl Columbia, MD;  Location: [facility] ENDOSCOPY;  Service: Endoscopy;  Laterality: N/A;   ESOPHAGOGASTRODUODENOSCOPY N/A 10/03/2013   Procedure: ESOPHAGOGASTRODUODENOSCOPY (EGD);  Surgeon: Jeryl Columbia, MD;  Location: [facility] ENDOSCOPY;  Service: Endoscopy;  Laterality: N/A;   ESOPHAGOGASTRODUODENOSCOPY N/A 08/10/2014   Procedure: ESOPHAGOGASTRODUODENOSCOPY (EGD);  Surgeon: Jeryl Columbia, MD;  Location: [facility] ENDOSCOPY;  Service: Endoscopy;  Laterality: N/A;   ESOPHAGOGASTRODUODENOSCOPY N/A 08/10/2016   Procedure: ESOPHAGOGASTRODUODENOSCOPY (EGD);  Surgeon: Clarene Essex, MD;  Location: [facility] ENDOSCOPY;  Service: Endoscopy;  Laterality: N/A;   ESOPHAGOGASTRODUODENOSCOPY N/A 07/19/2018   Procedure: ESOPHAGOGASTRODUODENOSCOPY (EGD);  Surgeon: Clarene Essex, MD;  Location: [facility] ENDOSCOPY;  Service: Endoscopy;  Laterality: N/A;   ESOPHAGOGASTRODUODENOSCOPY (EGD) WITH PROPOFOL N/A 02/15/2015   Procedure: ESOPHAGOGASTRODUODENOSCOPY (EGD) WITH PROPOFOL;  Surgeon: Clarene Essex, MD;  Location: [facility] ENDOSCOPY;  Service: Endoscopy;   Laterality: N/A;   ESOPHAGOGASTRODUODENOSCOPY (EGD) WITH PROPOFOL N/A 11/12/2015   Procedure: ESOPHAGOGASTRODUODENOSCOPY (EGD) WITH PROPOFOL;  Surgeon: Clarene Essex, MD;  Location: [facility] ENDOSCOPY;  Service: Endoscopy;  Laterality: N/A;   ESOPHAGOGASTRODUODENOSCOPY (EGD) WITH PROPOFOL N/A 08/07/2016   Procedure: ESOPHAGOGASTRODUODENOSCOPY (EGD) WITH PROPOFOL;  Surgeon: Clarene Essex, MD;  Location: [facility] ENDOSCOPY;  Service: Endoscopy;  Laterality: N/A;   ESOPHAGOGASTRODUODENOSCOPY (EGD) WITH PROPOFOL N/A 05/20/2017   Procedure: ESOPHAGOGASTRODUODENOSCOPY (EGD) WITH PROPOFOL;  Surgeon: Clarene Essex, MD;  Location: [facility]OS[facility];  Service: Endoscopy;  Laterality: N/A;   ESOPHAGOGASTRODUODENOSCOPY (EGD) WITH PROPOFOL N/A 12/02/2017   Procedure: ESOPHAGOGASTRODUODENOSCOPY (EGD) WITH PROPOFOL;  Surgeon: Clarene Essex, MD;  Location: [place];  Service: Endoscopy;  Laterality: N/A;  have c arm available   ESOPHAGOGASTRODUODENOSCOPY (EGD) WITH PROPOFOL N/A 01/23/2019   Procedure: ESOPHAGOGASTRODUODENOSCOPY (EGD) WITH PROPOFOL;  Surgeon: Clarene Essex, MD;  Location: [facility]OS[facility];  Service: Endoscopy;  Laterality: N/A;   ESOPHAGOGASTRODUODENOSCOPY (EGD) WITH PROPOFOL N/A 07/13/2019   Procedure: ESOPHAGOGASTRODUODENOSCOPY (EGD) WITH PROPOFOL;  Surgeon: Clarene Essex, MD;  Location: [facility];  Service: Endoscopy;  Laterality: N/A;   ESOPHAGOGASTRODUODENOSCOPY (EGD) WITH PROPOFOL N/A 02/02/2020   Procedure: ESOPHAGOGASTRODUODENOSCOPY (EGD) WITH PROPOFOL;  Surgeon: Clarene Essex, MD;  Location: [facility] ENDOS[facility];  Service: Endoscopy;  Laterality: N/A;   ESOPHAGOGASTRODUODENOSCOPY (EGD) WITH PROPOFOL N/A 07/16/2020   Procedure: ESOPHAGOGASTRODUODENOSCOPY (EGD) WITH PROPOFOL;  Surgeon: Clarene Essex, MD;  Location: [facility];  Service: Endoscopy;  Laterality: N/A;   ESOPHAGOGASTRODUODENOSCOPY (EGD) WITH PROPOFOL N/A 02/19/2021   Procedure: ESOPHAGOGASTRODUODENOSCOPY (EGD) WITH PROPOFOL;  Surgeon: Clarene Essex, MD;  Location: [facility]  ENDOSCOPY;  Service: Endoscopy;  Laterality: N/A;  with possible botox   ESOPHAGOGASTRODUODENOSCOPY (EGD) WITH PROPOFOL N/A 09/30/2021   Procedure: ESOPHAGOGASTRODUODENOSCOPY (EGD) WITH PROPOFOL;  Surgeon: Clarene Essex, MD;  Location: [facility] ENDOS[facility];  Service: Endoscopy;  Laterality: N/A;   ESOPHAGOGASTRODUODENOSCOPY (EGD) WITH PROPOFOL N/A 03/19/2022   Procedure: ESOPHAGOGASTRODUODENOSCOPY (EGD) WITH PROPOFOL;  Surgeon: Clarene Essex, MD;  Location: [facility] ENDOS[facility];  Service: Gastroenterology;  Laterality: N/A;   FOREIGN BODY REMOVAL  07/19/2018   Procedure: FOREIGN BODY REMOVAL;  Surgeon: Clarene Essex, MD;  Location: [facility] ENDOSCOPY;  Service: Endoscopy;;   FRACTURE SURGERY     Hx: of left wrist 01/2021   GASTRECTOMY  [phone]   GASTRECTOMY N/A    X3   LITHOTRIPSY     Hx; of for kidney stones 23007   SAVORY DILATION N/A 08/10/2014   Procedure: SAVORY DILATION;  Surgeon: Jeryl Columbia, MD;  Location: [facility] ENDOSCOPY;  Service: Endoscopy;  Laterality: N/A;  TOTAL HIP ARTHROPLASTY Right 12/30/2017   Procedure: RIGHT TOTAL HIP ARTHROPLASTY;  Surgeon: Carole Civil, MD;  Location: [facility];  Service: Orthopedics;  Laterality: Right;   TOTAL HIP ARTHROPLASTY Left 02/17/2018   Procedure: TOTAL HIP ARTHROPLASTY;  Surgeon: Carole Civil, MD;  Location: [facility];  Service: Orthopedics;  Laterality: Left;   TUBAL LIGATION     1975    Social History   Socioeconomic History   Marital status: Widowed    Spouse name: Not on file   Number of children: 1   Years of education: Not on file   Highest education level: Not on file  Occupational History   Not on file  Tobacco Use   Smoking status: Never   Smokeless tobacco: Never  Vaping Use   Vaping Use: Never used  Substance and Sexual Activity   Alcohol use: No   Drug use: No   Sexual activity: Not Currently  Other Topics Concern   Not on file  Social History Narrative   1 son-[name], lives next door.   Widowed since 2002   Social  Determinants of Health   Financial Resource Strain: Low Risk  (10/08/2021)   Overall Financial Resource Strain (CARDIA)    Difficulty of Paying Living Expenses: Not hard at all  Food Insecurity: No Food Insecurity (10/08/2021)   Hunger Vital Sign    Worried About Running Out of Food in the Last Year: Never true    Ran Out of Food in the Last Year: Never true  Transportation Needs: No Transportation Needs (10/08/2021)   PRAPARE - Hydrologist (Medical): No    Lack of Transportation (Non-Medical): No  Physical Activity: Sufficiently Active (10/08/2021)   Exercise Vital Sign    Days of Exercise per Week: 5 days    Minutes of Exercise per Session: 30 min  Stress: No Stress Concern Present (10/08/2021)   [place]    Feeling of Stress : Not at all  Social Connections: Moderately Integrated (10/08/2021)   Social Connection and Isolation Panel [NHANES]    Frequency of Communication with Friends and Family: More than three times a week    Frequency of Social Gatherings with Friends and Family: More than three times a week    Attends Religious Services: More than 4 times per year    Active Member of [employer] or Organizations: Yes    Attends Archivist Meetings: More than 4 times per year    Marital Status: Widowed  Intimate Partner Violence: Not At Risk (10/08/2021)   Humiliation, Afraid, Rape, and Kick questionnaire    Fear of Current or Ex-Partner: No    Emotionally Abused: No    Physically Abused: No    Sexually Abused: No    Family History  Problem Relation Age of Onset   Diabetes Mother    Cancer - Prostate Brother     Anti-infectives: Anti-infectives (From admission, onward)    None       Current Outpatient Medications  Medication Sig Dispense Refill   acetaminophen (TYLENOL) 500 MG tablet Take 500 mg by mouth 2 (two) times daily.     albuterol (VENTOLIN HFA) 108 (90 Base) MCG/ACT  inhaler USE 2 PUFFS EVERY 6 HOURS AS NEEDED 8.5 g 1   ALPRAZolam (XANAX) 0.5 MG tablet Take 1 tablet (0.5 mg total) by mouth 4 (four) times daily as needed for anxiety. 120 tablet 5   Ascorbic Acid (VITAMIN  C) 1000 MG tablet Take 1,000 mg by mouth daily.     Biotin 10 MG CAPS Take 10 mg by mouth daily.     BLACK ELDERBERRY PO Take 50 mg by mouth daily.     calcium citrate (CALCITRATE - DOSED IN MG ELEMENTAL CALCIUM) 950 (200 Ca) MG tablet Take 2 tablets by mouth 2 (two) times daily.     Cholecalciferol (VITAMIN D) 2000 units tablet Take 2,000 Units by mouth 2 (two) times daily.     diclofenac Sodium (VOLTAREN) 1 % GEL Apply 4 g topically daily as needed (Hip pain). 4 g 5   docusate sodium (COLACE) 100 MG capsule Take 1 capsule (100 mg total) by mouth 2 (two) times daily. 10 capsule 0   folic acid (FOLVITE) 517 MCG tablet Take 1 tablet (800 mcg total) by mouth daily. 90 tablet 1   levothyroxine (SYNTHROID) 100 MCG tablet Take 1 tablet (100 mcg total) by mouth daily. 90 tablet 3   loratadine (CLARITIN) 10 MG tablet TAKE ONE (1) TABLET EACH DAY (Patient taking differently: Take 10 mg by mouth daily.) 90 tablet 1   MAGNESIUM CITRATE PO Take 500 mg by mouth 2 (two) times daily.     Multiple Vitamins-Minerals (CENTRUM SILVER) CHEW Chew 1 tablet by mouth 2 (two) times daily.     omeprazole (PRILOSEC) 40 MG capsule Take 1 capsule (40 mg total) by mouth daily. 90 capsule 1   ondansetron (ZOFRAN-ODT) 4 MG disintegrating tablet TAKE 1 TABLET EVERY 8 HOURS AS NEEDED FOR NAUSEA AND VOMITING 30 tablet 0   Polyethyl Glycol-Propyl Glycol (SYSTANE OP) Place 1 drop into both eyes daily as needed (dry eyes).     Probiotic Product (PROBIOTIC PO) Take 1 capsule by mouth daily.      sertraline (ZOLOFT) 100 MG tablet TAKE TWO (2) TABLETS BY MOUTH DAILY 180 tablet 1   sucralfate (CARAFATE) 1 g tablet TAKE ONE TABLET FOUR TIMES A DAY BEFORE MEALS AND AT BEDTIME 120 tablet 1   Turmeric Curcumin 500 MG CAPS Take 500 mg by  mouth daily.     No current facility-administered medications for this visit.     Objective: Vital signs in last 24 hours: BP 113/65   Pulse 85   Intake/Output from previous day: No intake/output data recorded. Intake/Output this shift: '@IOTHISSHIFT'$ @   Physical Exam  Lab Results:  Results for orders placed or performed in visit on 05/28/22 (from the past 24 hour(s))  Urinalysis, Routine w reflex microscopic     Status: Abnormal   Collection Time: 05/28/22  2:50 PM  Result Value Ref Range   Specific Gravity, UA 1.020 1.005 - 1.030   pH, UA 5.5 5.0 - 7.5   Color, UA Yellow Yellow   Appearance Ur Clear Clear   Leukocytes,UA 1+ (A) Negative   Protein,UA 2+ (A) Negative/Trace   Glucose, UA Negative Negative   Ketones, UA Trace (A) Negative   RBC, UA Negative Negative   Bilirubin, UA Negative Negative   Urobilinogen, Ur 0.2 0.2 - 1.0 mg/dL   Nitrite, UA Negative Negative   Microscopic Examination See below:    Narrative   Performed at:  [facility] [address] [place], [place]  [id number] Lab Director: Mina Marble MT, Phone:  [phone]  Microscopic Examination     Status: Abnormal   Collection Time: 05/28/22  2:50 PM   Urine  Result Value Ref Range   WBC, UA 11-30 (A) 0 - 5 /hpf   RBC,  Urine 0-2 0 - 2 /hpf   Epithelial Cells (non renal) 0-10 0 - 10 /hpf   Casts Present (A) None seen /lpf   Cast Type Hyaline casts N/A   Bacteria, UA Few None seen/Few   Narrative   Performed at:  [facility] [address] [place], [place]  [id number] Lab Director: Roann, Phone:  [phone]      BMET No results for input(s): "NA", "K", "CL", "CO2", "GLUCOSE", "BUN", "CREATININE", "CALCIUM" in the last 72 hours. PT/INR No results for input(s): "LABPROT", "INR" in the last 72 hours. ABG No results for input(s): "PHART", "HCO3" in the last 72 hours.  Invalid input(s): "PCO2", "PO2"  Studies/Results: DG [facility] DEXA  Result  Date: 05/28/2022 EXAM: DUAL X-RAY ABSORPTIOMETRY (DXA) FOR BONE MINERAL DENSITY 05/28/2022 4:37 pm CLINICAL DATA:  71 year old Female Unknown menopause status. osteoporosis History of fragility fracture. TECHNIQUE: An axial (e.g., hips, spine) and/or appendicular (e.g., radius) exam was performed, as appropriate, using GE Nature conservation officer at [place]. Images are obtained for bone mineral density measurement and are not obtained for diagnostic purposes. [id number] Exclusions: Left and right hips due to hip replacements. COMPARISON:  05/03/2020 FINDINGS: Scan quality: Good. LUMBAR SPINE (L1-L4): BMD (in g/cm2): 0.968 T-score: -1.8 Z-score: 0.5 Rate of change from previous exam: 6.8 % RIGHT FOREARM (RADIUS 33%): BMD (in g/cm2): 0.418 T-score: -5.3 Z-score: -3.3 Rate of change from previous exam: No significant rate of change from previous exam. FRAX 10-YEAR PROBABILITY OF FRACTURE: Patient does not meet criteria for FRAX assessment. IMPRESSION: Osteoporosis based on BMD. Fracture risk is increased. Increased risk is based on low BMD, and history of fracture. RECOMMENDATIONS: 1. All patients should optimize calcium and vitamin D intake. 2. Consider FDA-approved medical therapies in postmenopausal women and men aged 70 years and older, based on the following: - A hip or vertebral (clinical or morphometric) fracture - T-score less than or equal to -2.5 and secondary causes have been excluded. - Low bone mass (T-score between -1.0 and -2.5) and a 10-year probability of a hip fracture greater than or equal to 3% or a 10-year probability of a major osteoporosis-related fracture greater than or equal to 20% based on the [place]-adapted WHO algorithm. - Clinician judgment and/or patient preferences may indicate treatment for people with 10-year fracture probabilities above or below these levels 3. Patients with diagnosis of osteoporosis or at high risk for fracture should have regular  bone mineral density tests. For patients eligible for Medicare, routine testing is allowed once every 2 years. The testing frequency can be increased to one year for patients who have rapidly progressing disease, those who are receiving or discontinuing medical therapy to restore bone mass, or have additional risk factors. Electronically Signed   By: Margarette Canada M.D.   On: 05/28/2022 16:46   DG [facility] DEXA  Result Date: 05/28/2022 EXAM: DUAL X-RAY ABSORPTIOMETRY (DXA) FOR BONE MINERAL DENSITY 05/28/2022 4:37 pm CLINICAL DATA:  71 year old Female Unknown menopause status. osteoporosis History of fragility fracture. TECHNIQUE: An axial (e.g., hips, spine) and/or appendicular (e.g., radius) exam was performed, as appropriate, using GE Nature conservation officer at [place]. Images are obtained for bone mineral density measurement and are not obtained for diagnostic purposes. [id number] Exclusions: Left and right hips due to hip replacements. COMPARISON:  05/03/2020 FINDINGS: Scan quality: Good. LUMBAR SPINE (L1-L4): BMD (in g/cm2): 0.968 T-score: -1.8 Z-score: 0.5 Rate of change from previous exam:  6.8 % RIGHT FOREARM (RADIUS 33%): BMD (in g/cm2): 0.418 T-score: -5.3 Z-score: -3.3 Rate of change from previous exam: No significant rate of change from previous exam. FRAX 10-YEAR PROBABILITY OF FRACTURE: Patient does not meet criteria for FRAX assessment. IMPRESSION: Osteoporosis based on BMD. Fracture risk is increased. Increased risk is based on low BMD, and history of fracture. RECOMMENDATIONS: 1. All patients should optimize calcium and vitamin D intake. 2. Consider FDA-approved medical therapies in postmenopausal women and men aged 54 years and older, based on the following: - A hip or vertebral (clinical or morphometric) fracture - T-score less than or equal to -2.5 and secondary causes have been excluded. - Low bone mass (T-score between -1.0 and -2.5) and a 10-year probability  of a hip fracture greater than or equal to 3% or a 10-year probability of a major osteoporosis-related fracture greater than or equal to 20% based on the [place]-adapted WHO algorithm. - Clinician judgment and/or patient preferences may indicate treatment for people with 10-year fracture probabilities above or below these levels 3. Patients with diagnosis of osteoporosis or at high risk for fracture should have regular bone mineral density tests. For patients eligible for Medicare, routine testing is allowed once every 2 years. The testing frequency can be increased to one year for patients who have rapidly progressing disease, those who are receiving or discontinuing medical therapy to restore bone mass, or have additional risk factors. Electronically Signed   By: Margarette Canada M.D.   On: 05/28/2022 16:46   CT RENAL STONE STUDY  Result Date: 05/06/2022 CLINICAL DATA:  Nephrolithiasis. EXAM: CT ABDOMEN AND PELVIS WITHOUT CONTRAST TECHNIQUE: Multidetector CT imaging of the abdomen and pelvis was performed following the standard protocol without IV contrast. RADIATION DOSE REDUCTION: This exam was performed according to the departmental dose-optimization program which includes automated exposure control, adjustment of the mA and/or kV according to patient size and/or use of iterative reconstruction technique. COMPARISON:  04/14/2021 FINDINGS: Lower chest: No acute findings. Hepatobiliary: No mass visualized on this unenhanced exam. Prior cholecystectomy. No evidence of biliary obstruction. Pancreas: No mass or inflammatory process visualized on this unenhanced exam. Spleen:  Within normal limits in size. Adrenals/Urinary tract: Multiple renal calculi are again seen bilaterally, largest in right kidney measuring 11 mm. No evidence of hydronephrosis. No ureteral calculi visualized, although the distal ureters and urinary bladder are obscured by severe beam hardening artifact from bilateral hip prostheses. Stomach/Bowel:  Prior gastric bypass surgery again noted. No evidence of obstruction, inflammatory process, or abnormal fluid collections. Mild left-sided colonic diverticulosis is noted, however there is no evidence of diverticulitis. Vascular/Lymphatic: No pathologically enlarged lymph nodes identified. No evidence of abdominal aortic aneurysm. Aortic atherosclerotic calcification incidentally noted. Reproductive: Not visualized due to severe beam hardening artifact from bilateral hip prostheses. Other:  None. Musculoskeletal:  No suspicious bone lesions identified. IMPRESSION: Bilateral nephrolithiasis. No evidence of hydronephrosis, or other acute findings. Colonic diverticulosis, without radiographic evidence of diverticulitis. Electronically Signed   By: Marlaine Hind M.D.   On: 05/06/2022 16:29    Assessment/Plan: Bilateral renal stones and right renal malrotation.  She is doing well with stable right renal stones and reduced left renal stones.   F/u in 1 year with a KUB.   Per his UTI.  UA is unremarkable.   No orders of the defined types were placed in this encounter.    Orders Placed This Encounter  Procedures   Microscopic Examination   DG Abd 1 View    Standing Status:  Future    Standing Expiration Date:   05/29/2023    Order Specific Question:   Reason for Exam (SYMPTOM  OR DIAGNOSIS REQUIRED)    Answer:   renal stones    Order Specific Question:   Preferred imaging location?    Answer:   [facility]    Order Specific Question:   Radiology Contrast Protocol - do NOT remove file path    Answer:   [email or web address]   US RENAL    Standing Status:   Future    Standing Expiration Date:   05/29/2023    Order Specific Question:   Reason for Exam (SYMPTOM  OR DIAGNOSIS REQUIRED)    Answer:   renal stones    Order Specific Question:   Preferred imaging location?    Answer:   [facility]   Urinalysis, Routine w reflex microscopic      Return in about 1 year (around 05/29/2023) for with KUB and renal US. .    CC: Chevis Pretty FNP.      [name] 05/29/2022 [phone]

## 2022-05-29 LAB — URINALYSIS, ROUTINE W REFLEX MICROSCOPIC
Bilirubin, UA: NEGATIVE
Glucose, UA: NEGATIVE
Nitrite, UA: NEGATIVE
RBC, UA: NEGATIVE
Specific Gravity, UA: 1.02 (ref 1.005–1.030)
Urobilinogen, Ur: 0.2 mg/dL (ref 0.2–1.0)
pH, UA: 5.5 (ref 5.0–7.5)

## 2022-05-29 LAB — MICROSCOPIC EXAMINATION

## 2022-06-01 ENCOUNTER — Telehealth: Payer: Self-pay | Admitting: Nurse Practitioner

## 2022-06-01 NOTE — Telephone Encounter (Signed)
Patient notified and verbalized understanding. 

## 2022-06-10 ENCOUNTER — Encounter: Payer: Self-pay | Admitting: *Deleted

## 2022-06-16 ENCOUNTER — Inpatient Hospital Stay: Payer: PPO

## 2022-06-16 ENCOUNTER — Inpatient Hospital Stay: Payer: PPO | Attending: Oncology | Admitting: Oncology

## 2022-06-16 VITALS — BP 125/65 | HR 71 | Temp 98.2°F | Resp 18 | Ht 65.0 in | Wt 103.2 lb

## 2022-06-16 DIAGNOSIS — Z79899 Other long term (current) drug therapy: Secondary | ICD-10-CM | POA: Diagnosis not present

## 2022-06-16 DIAGNOSIS — D500 Iron deficiency anemia secondary to blood loss (chronic): Secondary | ICD-10-CM | POA: Diagnosis not present

## 2022-06-16 DIAGNOSIS — Z96642 Presence of left artificial hip joint: Secondary | ICD-10-CM | POA: Diagnosis not present

## 2022-06-16 LAB — CBC WITH DIFFERENTIAL (CANCER CENTER ONLY)
Abs Immature Granulocytes: 0.02 10*3/uL (ref 0.00–0.07)
Basophils Absolute: 0 10*3/uL (ref 0.0–0.1)
Basophils Relative: 1 %
Eosinophils Absolute: 0.2 10*3/uL (ref 0.0–0.5)
Eosinophils Relative: 3 %
HCT: 39.1 % (ref 36.0–46.0)
Hemoglobin: 12.5 g/dL (ref 12.0–15.0)
Immature Granulocytes: 0 %
Lymphocytes Relative: 35 %
Lymphs Abs: 2.1 10*3/uL (ref 0.7–4.0)
MCH: 33.2 pg (ref 26.0–34.0)
MCHC: 32 g/dL (ref 30.0–36.0)
MCV: 103.7 fL — ABNORMAL HIGH (ref 80.0–100.0)
Monocytes Absolute: 0.6 10*3/uL (ref 0.1–1.0)
Monocytes Relative: 10 %
Neutro Abs: 3.1 10*3/uL (ref 1.7–7.7)
Neutrophils Relative %: 51 %
Platelet Count: 254 10*3/uL (ref 150–400)
RBC: 3.77 MIL/uL — ABNORMAL LOW (ref 3.87–5.11)
RDW: 13.2 % (ref 11.5–15.5)
WBC Count: 6 10*3/uL (ref 4.0–10.5)
nRBC: 0 % (ref 0.0–0.2)

## 2022-06-16 LAB — FERRITIN: Ferritin: 136 ng/mL (ref 11–307)

## 2022-06-16 NOTE — Progress Notes (Signed)
  [place] OFFICE PROGRESS NOTE   Diagnosis: Iron deficiency anemia  INTERVAL HISTORY:   Ms. Huffstetler returns as scheduled.  She developed increased dysphagia a few months ago and underwent an upper endoscopy and dilation of the gastrojejunostomy on 03/19/2022.  She had a recent upper respiratory infection and reports testing negative for COVID-19, RSV, and influenza.  She has occasional nausea.  No other complaint.  Objective:  Vital signs in last 24 hours:  Blood pressure 125/65, pulse 71, temperature 98.2 F (36.8 C), temperature source Oral, resp. rate 18, height '5\' 5"'$  (1.651 m), weight 103 lb 3.2 oz (46.8 kg), SpO2 100 %.    Resp: Lungs clear bilaterally Cardio: Regular rate and rhythm GI: Nontender, no mass, no hepatosplenomegaly Vascular: No leg edema   Lab Results:  Lab Results  Component Value Date   WBC 6.0 06/16/2022   HGB 12.5 06/16/2022   HCT 39.1 06/16/2022   MCV 103.7 (H) 06/16/2022   PLT 254 06/16/2022   NEUTROABS 3.1 06/16/2022    CMP  Lab Results  Component Value Date   NA 143 11/24/2021   K 4.8 11/24/2021   CL 113 (H) 11/24/2021   CO2 19 (L) 11/24/2021   GLUCOSE 86 11/24/2021   BUN 24 11/24/2021   CREATININE 0.88 11/24/2021   CALCIUM 9.0 11/24/2021   PROT 5.9 (L) 11/24/2021   ALBUMIN 4.1 11/24/2021   AST 14 11/24/2021   ALT 17 11/24/2021   ALKPHOS 67 11/24/2021   BILITOT <0.2 11/24/2021   GFRNONAA >60 04/14/2021   GFRAA 63 05/03/2020     Medications: I have reviewed the patient's current medications.   Assessment/Plan: Chronic anemia secondary to gastrointestinal blood loss and iron deficiency, she last received IV iron December 2022 History of kidney stones, followed by Dr. Roni Bread. Gram-negative sepsis and septic shock syndrome April 2008. History of acute renal failure secondary to obstruction and septic shock. History of adrenal insufficiency, no longer on hormone replacement.  Questionable allergic to Venofer in  June 2010. She reported an elevated blood pressure and "dizziness" on the day following the Venofer therapy. She has tolerated iron dextran without an apparent reaction. History of a left forearm fracture.   Intermittent low-serum bicarbonate level, ? related to diarrhea or renal insufficiency Dysphagia, improved with erythromycin and esophageal dilatation procedures , status post dilatation of the gastric jejunal anastomosis 08/10/2014,02/15/2015, 11/12/2015, 08/10/2016, 05/20/2017 Chest x-ray 05/21/2017-COPD changes with questionable nodular density inferior left chest. CT chest scheduled 06/07/2017. Right hip arthroplasty May 2019, left hip arthroplasty July 2019 COVID-19 infection January 2021 Osteoporosis    Disposition: Ms. Paterson appears stable.  The hemoglobin and ferritin are normal.  She will call for symptoms of anemia.  She will return in 3 months in 6 months for a CBC and ferritin level.  She will be scheduled for an office visit in 1 year.  Betsy Coder, MD  06/16/2022  12:46 PM

## 2022-06-30 ENCOUNTER — Other Ambulatory Visit: Payer: Self-pay | Admitting: Nurse Practitioner

## 2022-07-20 ENCOUNTER — Ambulatory Visit
Admission: EM | Admit: 2022-07-20 | Discharge: 2022-07-20 | Disposition: A | Discharge status: Home / Self Care | Payer: PPO | Attending: Nurse Practitioner | Admitting: Nurse Practitioner

## 2022-07-20 DIAGNOSIS — J069 Acute upper respiratory infection, unspecified: Secondary | ICD-10-CM

## 2022-07-20 MED ORDER — BENZONATATE 100 MG PO CAPS: 100.0000 mg | ORAL_CAPSULE | Freq: Three times a day (TID) | ORAL | 0 refills | Status: DC | PRN

## 2022-07-20 NOTE — Discharge Instructions (Signed)
As discussed, you declined COVID/flu testing today. Take medication as prescribed.   Increase fluids and allow for plenty of rest. Recommend using a humidifier in your bedroom at nighttime during sleep and sleeping elevated on pillows while cough symptoms persist. As discussed, a viral illness can last anywhere from 10 to 14 days.  If your symptoms suddenly worsen or they persist beyond that time, please follow-up in this clinic or with your primary care physician for further evaluation. Follow-up as needed.

## 2022-07-20 NOTE — ED Provider Notes (Signed)
[facility]    CSN: [id number] Arrival date & time: 07/20/22  0626      History   Chief Complaint Chief Complaint  Patient presents with   Cough    HPI Brittany Hensley is a 71 y.o. female.   The history is provided by the patient.   Presents with a 4 to 5-day history of cough, and congestion.  Patient denies fever, chills, sore throat, headache, wheezing, shortness of breath, difficulty breathing, or GI symptoms.  She reports she has been taking over-the-counter Delsym and Mucinex for her symptoms.  She denies any obvious known sick contacts.  Patient reports a previous history of pneumonia.  Past Medical History:  Diagnosis Date   Anemia    iron    Anxiety    Arthritis    COVID    2020 bad cold s/s lasted 1 week. 04/28/2021   Depression    Diverticulitis    Hx: of   GERD (gastroesophageal reflux disease)    at times for 15 yrs 04/28/2021   History of kidney stones    10 years ago   Hypothyroidism    Low iron    Pneumonia    2020   Rosacea    Hx: of   SCC (squamous cell carcinoma) 08/13/2021   in situ-left temple (CX35FU)   SCC (squamous cell carcinoma) 08/13/2021   well diff-right forehead (CX35FU)   Squamous cell carcinoma of skin 08/03/2021   in situ- left malar cheek (CX35FU)    Patient Active Problem List   Diagnosis Date Noted   Closed nondisplaced oblique fracture of shaft of ulna with routine healing 03/20/2021   Closed fracture of lower end of right ulna 01/11/2021   Oral phase dysphagia 12/19/2020   Slow transit constipation 12/19/2020   Weight decreased 12/19/2020   Acquired hypertrophic pyloric stenosis 12/19/2020   Osteoporosis without pathological fracture 06/18/2020   Anemia, unspecified 06/27/2018   Age-related osteoporosis with current pathological fracture of right femur ([place]) 03/01/2018   Status post total hip replacement, left 02/17/18 02/17/2018   Closed displaced fracture of left femoral neck ([place]) 02/14/2018   S/P hip  replacement, right 12/30/17 01/13/2018   Closed displaced fracture of right femoral neck ([place]) 12/30/2017   BMI less than 19,adult 01/10/2016   GAD (generalized anxiety disorder) 01/10/2016   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   Hypothyroidism 04/24/2013   GERD (gastroesophageal reflux disease) 04/24/2013   Rosacea 04/24/2013   Iron deficiency anemia due to chronic blood loss 06/09/2012   Gastric outlet obstruction 06/16/2011   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/2010   MURMUR 03/11/2010   PERSONAL HISTORY OF URINARY CALCULI 02/14/2008    Past Surgical History:  Procedure Laterality Date   APPENDECTOMY     BALLOON DILATION  06/16/2011   Procedure: BALLOON DILATION;  Surgeon: Jeryl Columbia, MD;  Location: [facility] ENDOSCOPY;  Service: Endoscopy;  Laterality: N/A;   BALLOON DILATION N/A 10/03/2013   Procedure: BALLOON DILATION;  Surgeon: Jeryl Columbia, MD;  Location: [facility]OSCOPY;  Service: Endoscopy;  Laterality: N/A;   BALLOON DILATION N/A 08/10/2014   Procedure: BALLOON DILATION;  Surgeon: Jeryl Columbia, MD;  Location: [facility];  Service: Endoscopy;  Laterality: N/A;  from 12 - 15 cm dilation completed   BALLOON DILATION N/A 02/15/2015   Procedure: BALLOON DILATION;  Surgeon: Clarene Essex, MD;  Location: [facility];  Service: Endoscopy;  Laterality: N/A;   BALLOON DILATION N/A 11/12/2015   Procedure: BALLOON DILATION;  Surgeon: Clarene Essex,  MD;  Location: [facility];  Service: Endoscopy;  Laterality: N/A;   BALLOON DILATION N/A 08/10/2016   Procedure: BALLOON DILATION;  Surgeon: Clarene Essex, MD;  Location: [facility] ENDOSCOPY;  Service: Endoscopy;  Laterality: N/A;   BALLOON DILATION N/A 08/07/2016   Procedure: BALLOON DILATION;  Surgeon: Clarene Essex, MD;  Location: [facility] ENDOSCOPY;  Service: Endoscopy;  Laterality: N/A;   BALLOON DILATION N/A 05/20/2017   Procedure: BALLOON DILATION;  Surgeon: Clarene Essex, MD;  Location: [facility];  Service: Endoscopy;  Laterality: N/A;    BALLOON DILATION N/A 12/02/2017   Procedure: BALLOON DILATION;  Surgeon: Clarene Essex, MD;  Location: [facility];  Service: Endoscopy;  Laterality: N/A;   BALLOON DILATION N/A 07/19/2018   Procedure: BALLOON DILATION;  Surgeon: Clarene Essex, MD;  Location: [facility];  Service: Endoscopy;  Laterality: N/A;   BALLOON DILATION N/A 01/23/2019   Procedure: BALLOON DILATION;  Surgeon: Clarene Essex, MD;  Location: [facility];  Service: Endoscopy;  Laterality: N/A;   BALLOON DILATION N/A 07/13/2019   Procedure: BALLOON DILATION;  Surgeon: Clarene Essex, MD;  Location: [facility];  Service: Endoscopy;  Laterality: N/A;   BALLOON DILATION N/A 02/02/2020   Procedure: BALLOON DILATION;  Surgeon: Clarene Essex, MD;  Location: [facility];  Service: Endoscopy;  Laterality: N/A;   BALLOON DILATION N/A 07/16/2020   Procedure: BALLOON DILATION;  Surgeon: Clarene Essex, MD;  Location: [facility];  Service: Endoscopy;  Laterality: N/A;   BALLOON DILATION N/A 02/19/2021   Procedure: BALLOON DILATION;  Surgeon: Clarene Essex, MD;  Location: [facility];  Service: Endoscopy;  Laterality: N/A;   BALLOON DILATION N/A 09/30/2021   Procedure: BALLOON DILATION;  Surgeon: Clarene Essex, MD;  Location: [facility];  Service: Endoscopy;  Laterality: N/A;   BALLOON DILATION N/A 03/19/2022   Procedure: BALLOON DILATION;  Surgeon: Clarene Essex, MD;  Location: [facility];  Service: Gastroenterology;  Laterality: N/A;   CATARACT EXTRACTION, BILATERAL     2019   CHOLECYSTECTOMY OPEN  1979   COLONOSCOPY     Hx: of   CYSTOSCOPY/URETEROSCOPY/HOLMIUM LASER/STENT PLACEMENT Bilateral 05/06/2021   Procedure: CYSTOSCOPY BILATERAL RETROGRADE LEFT URETEROSCOPY/HOLMIUM LASER/STENT PLACEMENT;  Surgeon: [name], MD;  Location: [facility];  Service: Urology;  Laterality: Bilateral;   ESOPHAGOGASTRODUODENOSCOPY  06/16/2011   Procedure: ESOPHAGOGASTRODUODENOSCOPY (EGD);  Surgeon: Jeryl Columbia, MD;  Location: [facility]  ENDOSCOPY;  Service: Endoscopy;  Laterality: N/A;   ESOPHAGOGASTRODUODENOSCOPY N/A 10/03/2013   Procedure: ESOPHAGOGASTRODUODENOSCOPY (EGD);  Surgeon: Jeryl Columbia, MD;  Location: [facility];  Service: Endoscopy;  Laterality: N/A;   ESOPHAGOGASTRODUODENOSCOPY N/A 08/10/2014   Procedure: ESOPHAGOGASTRODUODENOSCOPY (EGD);  Surgeon: Jeryl Columbia, MD;  Location: [facility];  Service: Endoscopy;  Laterality: N/A;   ESOPHAGOGASTRODUODENOSCOPY N/A 08/10/2016   Procedure: ESOPHAGOGASTRODUODENOSCOPY (EGD);  Surgeon: Clarene Essex, MD;  Location: [facility] ENDOSCOPY;  Service: Endoscopy;  Laterality: N/A;   ESOPHAGOGASTRODUODENOSCOPY N/A 07/19/2018   Procedure: ESOPHAGOGASTRODUODENOSCOPY (EGD);  Surgeon: Clarene Essex, MD;  Location: [facility];  Service: Endoscopy;  Laterality: N/A;   ESOPHAGOGASTRODUODENOSCOPY (EGD) WITH PROPOFOL N/A 02/15/2015   Procedure: ESOPHAGOGASTRODUODENOSCOPY (EGD) WITH PROPOFOL;  Surgeon: Clarene Essex, MD;  Location: [facility];  Service: Endoscopy;  Laterality: N/A;   ESOPHAGOGASTRODUODENOSCOPY (EGD) WITH PROPOFOL N/A 11/12/2015   Procedure: ESOPHAGOGASTRODUODENOSCOPY (EGD) WITH PROPOFOL;  Surgeon: Clarene Essex, MD;  Location: [facility];  Service: Endoscopy;  Laterality: N/A;   ESOPHAGOGASTRODUODENOSCOPY (EGD) WITH PROPOFOL N/A 08/07/2016   Procedure: ESOPHAGOGASTRODUODENOSCOPY (EGD) WITH PROPOFOL;  Surgeon: Clarene Essex, MD;  Location: [facility] ENDOSCOPY;  Service: Endoscopy;  Laterality: N/A;   ESOPHAGOGASTRODUODENOSCOPY (EGD) WITH PROPOFOL N/A 05/20/2017   Procedure: ESOPHAGOGASTRODUODENOSCOPY (EGD) WITH PROPOFOL;  Surgeon: Clarene Essex, MD;  Location[facility];  Service: Endoscopy;  Laterality: N/A;   ESOPHAGOGASTRODUODENOSCOPY (EGD) WITH PROPOFOL N/A 12/02/2017   Procedure: ESOPHAGOGASTRODUODENOSCOPY (EGD) WITH PROPOFOL;  Surgeon: Clarene Essex, MD;  Location: [facility];  Service: Endoscopy;  Laterality: N/A;  have c arm available   ESOPHAGOGASTRODUODENOSCOPY (EGD) WITH PROPOFOL N/A  01/23/2019   Procedure: ESOPHAGOGASTRODUODENOSCOPY (EGD) WITH PROPOFOL;  Surgeon: Clarene Essex, MD;  Location: [facility];  Service: Endoscopy;  Laterality: N/A;   ESOPHAGOGASTRODUODENOSCOPY (EGD) WITH PROPOFOL N/A 07/13/2019   Procedure: ESOPHAGOGASTRODUODENOSCOPY (EGD) WITH PROPOFOL;  Surgeon: Clarene Essex, MD;  Location: [facility];  Service: Endoscopy;  Laterality: N/A;   ESOPHAGOGASTRODUODENOSCOPY (EGD) WITH PROPOFOL N/A 02/02/2020   Procedure: ESOPHAGOGASTRODUODENOSCOPY (EGD) WITH PROPOFOL;  Surgeon: Clarene Essex, MD;  Location: [facility];  Service: Endoscopy;  Laterality: N/A;   ESOPHAGOGASTRODUODENOSCOPY (EGD) WITH PROPOFOL N/A 07/16/2020   Procedure: ESOPHAGOGASTRODUODENOSCOPY (EGD) WITH PROPOFOL;  Surgeon: Clarene Essex, MD;  Location: [facility];  Service: Endoscopy;  Laterality: N/A;   ESOPHAGOGASTRODUODENOSCOPY (EGD) WITH PROPOFOL N/A 02/19/2021   Procedure: ESOPHAGOGASTRODUODENOSCOPY (EGD) WITH PROPOFOL;  Surgeon: Clarene Essex, MD;  Location: [facility];  Service: Endoscopy;  Laterality: N/A;  with possible botox   ESOPHAGOGASTRODUODENOSCOPY (EGD) WITH PROPOFOL N/A 09/30/2021   Procedure: ESOPHAGOGASTRODUODENOSCOPY (EGD) WITH PROPOFOL;  Surgeon: Clarene Essex, MD;  Location: [facility];  Service: Endoscopy;  Laterality: N/A;   ESOPHAGOGASTRODUODENOSCOPY (EGD) WITH PROPOFOL N/A 03/19/2022   Procedure: ESOPHAGOGASTRODUODENOSCOPY (EGD) WITH PROPOFOL;  Surgeon: Clarene Essex, MD;  Location: [facility];  Service: Gastroenterology;  Laterality: N/A;   FOREIGN BODY REMOVAL  07/19/2018   Procedure: FOREIGN BODY REMOVAL;  Surgeon: Clarene Essex, MD;  Location: [facility];  Service: Endoscopy;;   FRACTURE SURGERY     Hx: of left wrist 01/2021   GASTRECTOMY  [phone]   GASTRECTOMY N/A    X3   LITHOTRIPSY     Hx; of for kidney stones 23007   SAVORY DILATION N/A 08/10/2014   Procedure: SAVORY DILATION;  Surgeon: Jeryl Columbia, MD;  Location: [facility];  Service: Endoscopy;   Laterality: N/A;   TOTAL HIP ARTHROPLASTY Right 12/30/2017   Procedure: RIGHT TOTAL HIP ARTHROPLASTY;  Surgeon: Carole Civil, MD;  Location: [facility];  Service: Orthopedics;  Laterality: Right;   TOTAL HIP ARTHROPLASTY Left 02/17/2018   Procedure: TOTAL HIP ARTHROPLASTY;  Surgeon: Carole Civil, MD;  Location: [facility];  Service: Orthopedics;  Laterality: Left;   TUBAL LIGATION     1975    OB History   No obstetric history on file.      Home Medications    Prior to Admission medications   Medication Sig Start Date End Date Taking? Authorizing Provider  benzonatate (TESSALON PERLES) 100 MG capsule Take 1 capsule (100 mg total) by mouth 3 (three) times daily as needed for cough. 07/20/22  Yes Shanaye Rief-Warren, Alda Lea, NP  acetaminophen (TYLENOL) 500 MG tablet Take 500 mg by mouth 2 (two) times daily.    [provider]  albuterol (VENTOLIN HFA) 108 (90 Base) MCG/ACT inhaler USE 2 PUFFS EVERY 6 HOURS AS NEEDED 04/27/22   Hassell Done, Mary-Margaret, FNP  ALPRAZolam Duanne Moron) 0.5 MG tablet Take 1 tablet (0.5 mg total) by mouth 4 (four) times daily as needed for anxiety. 05/26/22   Hassell Done, Mary-Margaret, FNP  Ascorbic Acid (VITAMIN C) 1000 MG tablet Take 1,000 mg by mouth daily.    [provider]  Biotin 10 MG CAPS Take 10 mg by mouth daily.    [provider]  BLACK ELDERBERRY PO Take 50 mg by mouth daily.    [provider]  calcium citrate (CALCITRATE - DOSED IN MG ELEMENTAL CALCIUM) 950 (200 Ca) MG tablet Take 2 tablets by mouth 2 (two) times daily.    [provider]  Cholecalciferol (VITAMIN D) 2000 units tablet Take 2,000 Units by mouth 2 (two) times daily.    [provider]  diclofenac Sodium (VOLTAREN) 1 % GEL Apply 4 g topically daily as needed (Hip pain). 05/11/22   Carole Civil, MD  docusate sodium (COLACE) 100 MG capsule Take 1 capsule (100 mg total) by mouth 2 (two) times daily. 01/03/18   Carole Civil, MD   folic acid (FOLVITE) 867 MCG tablet Take 1 tablet (800 mcg total) by mouth daily. 05/26/22   Hassell Done, Mary-Margaret, FNP  levothyroxine (SYNTHROID) 100 MCG tablet Take 1 tablet (100 mcg total) by mouth daily. 11/25/21   Hassell Done, Mary-Margaret, FNP  loratadine (CLARITIN) 10 MG tablet TAKE ONE (1) TABLET EACH DAY Patient taking differently: Take 10 mg by mouth daily. 01/20/22   Hassell Done, Mary-Margaret, FNP  MAGNESIUM CITRATE PO Take 500 mg by mouth 2 (two) times daily.    [provider]  Multiple Vitamins-Minerals (CENTRUM SILVER) CHEW Chew 1 tablet by mouth 2 (two) times daily.    [provider]  omeprazole (PRILOSEC) 40 MG capsule Take 1 capsule (40 mg total) by mouth daily. 05/26/22   Hassell Done, Mary-Margaret, FNP  ondansetron (ZOFRAN-ODT) 4 MG disintegrating tablet TAKE 1 TABLET EVERY 8 HOURS AS NEEDED FOR NAUSEA AND VOMITING 06/30/22   Hassell Done, Mary-Margaret, FNP  Polyethyl Glycol-Propyl Glycol (SYSTANE OP) Place 1 drop into both eyes daily as needed (dry eyes).    [provider]  Probiotic Product (PROBIOTIC PO) Take 1 capsule by mouth daily.     [provider]  sertraline (ZOLOFT) 100 MG tablet TAKE TWO (2) TABLETS BY MOUTH DAILY 05/26/22   Hassell Done, Mary-Margaret, FNP  sucralfate (CARAFATE) 1 g tablet TAKE ONE TABLET FOUR TIMES A DAY BEFORE MEALS AND AT BEDTIME 05/26/22   Hassell Done, Mary-Margaret, FNP  Turmeric Curcumin 500 MG CAPS Take 500 mg by mouth daily.    [provider]    Family History Family History  Problem Relation Age of Onset   Diabetes Mother    Cancer - Prostate Brother     Social History Social History   Tobacco Use   Smoking status: Never   Smokeless tobacco: Never  Vaping Use   Vaping Use: Never used  Substance Use Topics   Alcohol use: No   Drug use: No     Allergies   Augmentin [amoxicillin-pot clavulanate], Famotidine, Klonopin [clonazepam], Other, Nitrofurantoin, Reglan [metoclopramide], and  Sulfamethoxazole-trimethoprim   Review of Systems Review of Systems Per HPI  Physical Exam Triage Vital Signs ED Triage Vitals  Enc Vitals Group     BP 07/20/22 1224 132/79     Pulse Rate 07/20/22 1224 95     Resp 07/20/22 1224 17     Temp 07/20/22 1224 98 F (36.7 C)     Temp src --      SpO2 07/20/22 1224 95 %     Weight --      Height --      Head Circumference --      Peak Flow --      Pain Score 07/20/22 1223 8  Pain Loc --      Pain Edu? --      Excl. in [place]? --    No data found.  Updated Vital Signs BP 132/79   Pulse 95   Temp 98 F (36.7 C)   Resp 17   SpO2 95%   Visual Acuity Right Eye Distance:   Left Eye Distance:   Bilateral Distance:    Right Eye Near:   Left Eye Near:    Bilateral Near:     Physical Exam Vitals and nursing note reviewed.  Constitutional:      General: She is not in acute distress.    Appearance: Normal appearance.  HENT:     Head: Normocephalic.     Right Ear: Tympanic membrane, ear canal and external ear normal.     Left Ear: Tympanic membrane, ear canal and external ear normal.     Nose: Congestion present. No rhinorrhea.     Mouth/Throat:     Mouth: Mucous membranes are moist.  Eyes:     Extraocular Movements: Extraocular movements intact.     Conjunctiva/sclera: Conjunctivae normal.     Pupils: Pupils are equal, round, and reactive to light.  Cardiovascular:     Rate and Rhythm: Normal rate and regular rhythm.     Pulses: Normal pulses.     Heart sounds: Normal heart sounds.  Pulmonary:     Effort: Pulmonary effort is normal. No respiratory distress.     Breath sounds: Normal breath sounds. No stridor. No wheezing, rhonchi or rales.  Abdominal:     General: Bowel sounds are normal.     Palpations: Abdomen is soft.     Tenderness: There is no abdominal tenderness.  Musculoskeletal:     Cervical back: Normal range of motion.  Lymphadenopathy:     Cervical: No cervical adenopathy.  Skin:    General: Skin  is warm and dry.  Neurological:     General: No focal deficit present.     Mental Status: She is alert and oriented to person, place, and time.  Psychiatric:        Mood and Affect: Mood normal.        Behavior: Behavior normal.      UC Treatments / Results  Labs (all labs ordered are listed, but only abnormal results are displayed) Labs Reviewed - No data to display  EKG   Radiology No results found.  Procedures Procedures (including critical care time)  Medications Ordered in UC Medications - No data to display  Initial Impression / Assessment and Plan / UC Course  I have reviewed the triage vital signs and the nursing notes.  Pertinent labs & imaging results that were available during my care of the patient were reviewed by me and considered in my medical decision making (see chart for details).  The patient is well-appearing, she is in no acute distress, vital signs are stable.  Symptoms consistent with a viral upper respiratory infection with cough.  Patient's lung sounds are clear throughout, with no wheezing, rhonchi, or rales present.  Declines COVID/flu test today.  Symptomatic treatment will be provided with Tessalon pearls 100 mg.  Patient was also given supportive care recommendations to include increasing fluids, allowing for plenty of rest, and use of a humidifier during sleep.  Discussed viral etiology with the patient along with symptoms of when to return.  Patient verbalizes understanding.  All questions were answered.  Patient is stable for discharge.  Final Clinical Impressions(s) / UC  Diagnoses   Final diagnoses:  Viral upper respiratory tract infection with cough     Discharge Instructions      As discussed, you declined COVID/flu testing today. Take medication as prescribed.   Increase fluids and allow for plenty of rest. Recommend using a humidifier in your bedroom at nighttime during sleep and sleeping elevated on pillows while cough symptoms  persist. As discussed, a viral illness can last anywhere from 10 to 14 days.  If your symptoms suddenly worsen or they persist beyond that time, please follow-up in this clinic or with your primary care physician for further evaluation. Follow-up as needed.      ED Prescriptions     Medication Sig Dispense Auth. Provider   benzonatate (TESSALON PERLES) 100 MG capsule Take 1 capsule (100 mg total) by mouth 3 (three) times daily as needed for cough. 30 capsule Gwyndolyn Guilford-Warren, Alda Lea, NP      PDMP not reviewed this encounter.   Tish Men, NP 07/20/22 1242

## 2022-07-20 NOTE — ED Triage Notes (Signed)
Pt presents with complaints of cough and congestion since Thursday.

## 2022-07-21 ENCOUNTER — Ambulatory Visit (INDEPENDENT_AMBULATORY_CARE_PROVIDER_SITE_OTHER): Payer: PPO | Admitting: Nurse Practitioner

## 2022-07-21 ENCOUNTER — Telehealth: Payer: Self-pay | Admitting: Radiology

## 2022-07-21 ENCOUNTER — Encounter: Payer: Self-pay | Admitting: Nurse Practitioner

## 2022-07-21 ENCOUNTER — Other Ambulatory Visit: Payer: Self-pay | Admitting: Family Medicine

## 2022-07-21 VITALS — BP 124/73 | HR 99 | Temp 97.7°F | Resp 20 | Ht 65.0 in | Wt 99.0 lb

## 2022-07-21 DIAGNOSIS — J0100 Acute maxillary sinusitis, unspecified: Secondary | ICD-10-CM | POA: Diagnosis not present

## 2022-07-21 DIAGNOSIS — R011 Cardiac murmur, unspecified: Secondary | ICD-10-CM

## 2022-07-21 MED ORDER — DOXYCYCLINE HYCLATE 100 MG PO TABS: 100.0000 mg | ORAL_TABLET | Freq: Two times a day (BID) | ORAL | 0 refills | Status: DC

## 2022-07-21 MED ORDER — METHYLPREDNISOLONE ACETATE 80 MG/ML IJ SUSP
80.0000 mg | Freq: Once | INTRAMUSCULAR | Status: AC
  Administered 2022-07-21: 80 mg via INTRAMUSCULAR

## 2022-07-21 NOTE — Patient Instructions (Signed)

## 2022-07-21 NOTE — Progress Notes (Signed)
Subjective:    Patient ID: Brittany Hensley, female    DOB: [birth year], 71 y.o.   MRN: [id number]   Chief Complaint: Cough (Seen at Urgent Care yesterday and they ordered tessalon pearles) and Nasal Congestion (Covid test negative at home)   Patient went to urgent care yesterday and was given tessalon perles. Flu and covid negative. Dx with URI.  Cough This is a new problem. Episode onset: thursday last week. The problem has been gradually worsening. The problem occurs constantly. The cough is Non-productive. Associated symptoms include headaches, nasal congestion, postnasal drip and rhinorrhea. Pertinent negatives include no chills, fever or sore throat. Nothing aggravates the symptoms. Risk factors for lung disease include animal exposure. Treatments tried: delsym and mucinex. The treatment provided no relief.       Review of Systems  Constitutional:  Negative for chills and fever.  HENT:  Positive for postnasal drip and rhinorrhea. Negative for sore throat.   Respiratory:  Positive for cough.   Neurological:  Positive for headaches.       Objective:   Physical Exam Vitals reviewed.  Constitutional:      Appearance: Normal appearance.  HENT:     Right Ear: Tympanic membrane normal.     Left Ear: Tympanic membrane normal.     Nose: Congestion and rhinorrhea present.     Right Sinus: Maxillary sinus tenderness present.     Left Sinus: Maxillary sinus tenderness present.  Cardiovascular:     Rate and Rhythm: Normal rate and regular rhythm.     Heart sounds: Normal heart sounds.  Pulmonary:     Effort: Pulmonary effort is normal.     Breath sounds: Normal breath sounds.  Skin:    General: Skin is warm.  Neurological:     General: No focal deficit present.     Mental Status: She is alert and oriented to person, place, and time.  Psychiatric:        Mood and Affect: Mood normal.        Behavior: Behavior normal.    BP 124/73   Pulse 99   Temp 97.7 F (36.5 C)  (Temporal)   Resp 20   Ht '5\' 5"'$  (1.651 m)   Wt 99 lb (44.9 kg)   SpO2 96%   BMI 16.47 kg/m         Assessment & Plan:   TIMEA BREED in today with chief complaint of Cough (Seen at Urgent Care yesterday and they ordered tessalon pearles) and Nasal Congestion (Covid test negative at home)   1. Acute non-recurrent maxillary sinusitis 1. Take meds as prescribed 2. Use a cool mist humidifier especially during the winter months and when heat has been humid. 3. Use saline nose sprays frequently 4. Saline irrigations of the nose can be very helpful if done frequently.  * 4X daily for 1 week*  * Use of a nettie pot can be helpful with this. Follow directions with this* 5. Drink plenty of fluids 6. Keep thermostat turn down low 7.For any cough or congestion- mucinex 8. For fever or aces or pains- take tylenol or ibuprofen appropriate for age and weight.  * for fevers greater than 101 orally you may alternate ibuprofen and tylenol every  3 hours.   Meds ordered this encounter  Medications   doxycycline (VIBRA-TABS) 100 MG tablet    Sig: Take 1 tablet (100 mg total) by mouth 2 (two) times daily. 1 po bid    Dispense:  20 tablet  Refill:  0    Order Specific Question:   Supervising Provider    Answer:   Caryl Pina A [[id number]]   methylPREDNISolone acetate (DEPO-MEDROL) injection 80 mg        The above assessment and management plan was discussed with the patient. The patient verbalized understanding of and has agreed to the management plan. Patient is aware to call the clinic if symptoms persist or worsen. Patient is aware when to return to the clinic for a follow-up visit. Patient educated on when it is appropriate to go to the emergency department.   Mary-Margaret Hassell Done, FNP

## 2022-07-21 NOTE — Telephone Encounter (Signed)
Patient called, LMVM.  She is sick, wants to reschedule tomorrow's appt with Dr Aline Brochure.  Maybe into the first week of January.  Please call her.

## 2022-07-22 ENCOUNTER — Encounter: Payer: PPO | Admitting: Orthopedic Surgery

## 2022-07-22 ENCOUNTER — Other Ambulatory Visit: Payer: Self-pay | Admitting: Nurse Practitioner

## 2022-07-29 ENCOUNTER — Other Ambulatory Visit: Payer: Self-pay | Admitting: Nurse Practitioner

## 2022-07-30 ENCOUNTER — Telehealth: Payer: Self-pay | Admitting: Nurse Practitioner

## 2022-07-30 NOTE — Telephone Encounter (Signed)
Patient aware and verbalized understanding. °

## 2022-07-30 NOTE — Telephone Encounter (Signed)
Should not need another antibiotic. Will take awhile  to get over this. Contnue flonase and OTC decongestant. Has to bee seen in person if needs another antibiotic.

## 2022-07-30 NOTE — Telephone Encounter (Signed)
Patient would like to know if she can have a refill of doxycycline (VIBRA-TABS) 100 MG tablet. She had an appt on 12/19 for sinus issues and said that she is still not feeling better. Please call back and advise.

## 2022-07-31 ENCOUNTER — Encounter: Payer: Self-pay | Admitting: Nurse Practitioner

## 2022-08-05 ENCOUNTER — Ambulatory Visit (INDEPENDENT_AMBULATORY_CARE_PROVIDER_SITE_OTHER): Payer: PPO | Admitting: Nurse Practitioner

## 2022-08-05 ENCOUNTER — Ambulatory Visit: Payer: PPO | Admitting: Family Medicine

## 2022-08-05 ENCOUNTER — Ambulatory Visit (INDEPENDENT_AMBULATORY_CARE_PROVIDER_SITE_OTHER): Payer: PPO

## 2022-08-05 ENCOUNTER — Encounter: Payer: Self-pay | Admitting: Nurse Practitioner

## 2022-08-05 VITALS — BP 133/81 | HR 91 | Temp 98.8°F | Ht 65.0 in | Wt 100.6 lb

## 2022-08-05 DIAGNOSIS — R0602 Shortness of breath: Secondary | ICD-10-CM

## 2022-08-05 MED ORDER — PREDNISONE 20 MG PO TABS: 20.0000 mg | ORAL_TABLET | Freq: Every day | ORAL | 0 refills | Status: DC

## 2022-08-05 MED ORDER — LEVOFLOXACIN 500 MG PO TABS: 500.0000 mg | ORAL_TABLET | Freq: Every day | ORAL | 0 refills | Status: DC

## 2022-08-05 NOTE — Progress Notes (Addendum)
   Acute Office Visit  Subjective:     Patient ID: Brittany Hensley, female    DOB: 1951-03-19, 72 y.o.   MRN: [id number]  Chief Complaint  Patient presents with   Cough   Nasal Congestion    Pt states this has been going on for 3 weeks now     HPI Patient is in today for ***  ROS      Objective:    BP 133/81   Pulse 91   Temp 98.8 F (37.1 C)   Ht '5\' 5"'$  (1.651 m)   Wt 100 lb 9.6 oz (45.6 kg)   SpO2 99%   BMI 16.74 kg/m  {Vitals History (Optional):23777}  Physical Exam  No results found for any visits on 08/05/22.      Assessment & Plan:   Problem List Items Addressed This Visit   None Visit Diagnoses     SOB (shortness of breath)    -  Primary   Relevant Orders   DG Chest 2 View       No orders of the defined types were placed in this encounter.   No follow-ups on file.  Ivy Lynn, NP

## 2022-08-05 NOTE — Patient Instructions (Signed)

## 2022-08-12 ENCOUNTER — Encounter: Payer: Self-pay | Admitting: Nurse Practitioner

## 2022-08-12 ENCOUNTER — Ambulatory Visit (INDEPENDENT_AMBULATORY_CARE_PROVIDER_SITE_OTHER): Payer: PPO | Admitting: Nurse Practitioner

## 2022-08-12 VITALS — BP 134/82 | HR 98 | Temp 98.7°F | Ht 65.0 in | Wt 101.4 lb

## 2022-08-12 DIAGNOSIS — R0602 Shortness of breath: Secondary | ICD-10-CM | POA: Diagnosis not present

## 2022-08-12 MED ORDER — NYSTATIN 100000 UNIT/ML MT SUSP: 5.0000 mL | Freq: Four times a day (QID) | OROMUCOSAL | 0 refills | Status: DC

## 2022-08-12 NOTE — Patient Instructions (Signed)
Shortness of Breath, Adult Shortness of breath means you have trouble breathing. Shortness of breath could be a sign of a medical problem. Follow these instructions at home:  Pollution Do not smoke or use any products that contain nicotine or tobacco. If you need help quitting, ask your doctor. Avoid things that can make it harder to breathe, such as: Smoke of all kinds. This includes smoke from campfires or forest fires. Do not smoke or allow others to smoke in your home. Mold. Dust. Air pollution. Chemical smells. Things that can give you an allergic reaction (allergens) if you have allergies. Keep your living space clean. Use products that help remove mold and dust. General instructions Watch for any changes in your symptoms. Take over-the-counter and prescription medicines only as told by your doctor. This includes oxygen therapy and inhaled medicines. Rest as needed. Return to your normal activities when your doctor says that it is safe. Keep all follow-up visits. Contact a doctor if: Your condition does not get better as soon as expected. You have a hard time doing your normal activities, even after you rest. You have new symptoms. You cannot walk up stairs. You cannot exercise the way you normally do. Get help right away if: Your shortness of breath gets worse. You have trouble breathing when you are resting. You feel light-headed or you faint. You have a cough that is not helped by medicines. You cough up blood. You have pain with breathing. You have pain in your chest, arms, shoulders, or belly (abdomen). You have a fever. These symptoms may be an emergency. Get help right away. Call 911. Do not wait to see if the symptoms will go away. Do not drive yourself to the hospital. Summary Shortness of breath is when you have trouble breathing enough air. It can be a sign of a medical problem. Avoid things that make it hard for you to breathe, such as smoking, pollution,  mold, and dust. Watch for any changes in your symptoms. Contact your doctor if you do not get better or you get worse. This information is not intended to replace advice given to you by your health care provider. Make sure you discuss any questions you have with your health care provider. Document Revised: 03/08/2021 Document Reviewed: 03/08/2021 Elsevier Patient Education  2023 Elsevier Inc.  

## 2022-08-12 NOTE — Progress Notes (Signed)
   Acute Office Visit  Subjective:     Patient ID: Brittany Hensley, female    DOB: May 22, 1951, 73 y.o.   MRN: [id number]  No chief complaint on file.   Shortness of Breath This is a recurrent problem. The current episode started in the past 7 days. The problem has been gradually improving. Associated symptoms include wheezing. Pertinent negatives include no chest pain, fever, headaches, leg swelling, rash or sputum production.    Review of Systems  Constitutional: Negative.  Negative for fever.  HENT: Negative.    Respiratory:  Positive for shortness of breath and wheezing. Negative for sputum production.   Cardiovascular: Negative.  Negative for chest pain and leg swelling.  Genitourinary: Negative.   Skin: Negative.  Negative for rash.  Neurological:  Negative for headaches.  All other systems reviewed and are negative.       Objective:    BP 134/82   Pulse 98   Temp 98.7 F (37.1 C)   Ht '5\' 5"'$  (1.651 m)   Wt 101 lb 6.4 oz (46 kg)   SpO2 97%   BMI 16.87 kg/m  BP Readings from Last 3 Encounters:  08/12/22 134/82  08/05/22 133/81  07/21/22 124/73   Wt Readings from Last 3 Encounters:  08/12/22 101 lb 6.4 oz (46 kg)  08/05/22 100 lb 9.6 oz (45.6 kg)  07/21/22 99 lb (44.9 kg)      Physical Exam Vitals and nursing note reviewed.  Constitutional:      Appearance: Normal appearance.  HENT:     Head: Normocephalic.     Right Ear: External ear normal.     Left Ear: External ear normal.     Nose: Nose normal.     Mouth/Throat:     Mouth: Mucous membranes are moist.     Pharynx: Oropharynx is clear.  Eyes:     Conjunctiva/sclera: Conjunctivae normal.  Cardiovascular:     Rate and Rhythm: Normal rate and regular rhythm.     Pulses: Normal pulses.     Heart sounds: Normal heart sounds.  Pulmonary:     Effort: Pulmonary effort is normal.     Breath sounds: Wheezing present.  Abdominal:     General: Bowel sounds are normal.  Skin:    Findings: No erythema  or rash.  Neurological:     General: No focal deficit present.     Mental Status: She is alert.     No results found for any visits on 08/12/22.      Assessment & Plan:  Patient presents with unresolved symptoms of shortness of breath. She did not take her prednisone. She is in clinic today with mild wheezing right lower lung. Advised patient to use her inhaler, 40 depoMedrol shot given on right buttocks in clinic. Follow up with worsening unresolved symptoms.   Problem List Items Addressed This Visit   None Visit Diagnoses     SOB (shortness of breath)    -  Primary       Meds ordered this encounter  Medications   nystatin (MYCOSTATIN) 100000 UNIT/ML suspension    Sig: Take 5 mLs (500,000 Units total) by mouth 4 (four) times daily.    Dispense:  60 mL    Refill:  0    Order Specific Question:   Supervising Provider    Answer:   Claretta Fraise [[id number]]    Return if symptoms worsen or fail to improve.  Ivy Lynn, NP

## 2022-08-13 ENCOUNTER — Ambulatory Visit: Payer: PPO | Admitting: Physician Assistant

## 2022-08-13 ENCOUNTER — Encounter: Payer: PPO | Admitting: Orthopedic Surgery

## 2022-08-14 ENCOUNTER — Other Ambulatory Visit: Payer: Self-pay | Admitting: Nurse Practitioner

## 2022-08-21 ENCOUNTER — Ambulatory Visit (INDEPENDENT_AMBULATORY_CARE_PROVIDER_SITE_OTHER): Payer: PPO

## 2022-08-21 ENCOUNTER — Encounter: Payer: Self-pay | Admitting: Orthopedic Surgery

## 2022-08-21 ENCOUNTER — Ambulatory Visit (INDEPENDENT_AMBULATORY_CARE_PROVIDER_SITE_OTHER): Payer: PPO | Admitting: Orthopedic Surgery

## 2022-08-21 VITALS — Ht 65.0 in | Wt 100.0 lb

## 2022-08-21 DIAGNOSIS — G8928 Other chronic postprocedural pain: Secondary | ICD-10-CM | POA: Diagnosis not present

## 2022-08-21 DIAGNOSIS — M25552 Pain in left hip: Secondary | ICD-10-CM | POA: Diagnosis not present

## 2022-08-21 DIAGNOSIS — Z96643 Presence of artificial hip joint, bilateral: Secondary | ICD-10-CM

## 2022-08-21 DIAGNOSIS — Z96641 Presence of right artificial hip joint: Secondary | ICD-10-CM

## 2022-08-21 DIAGNOSIS — Z96642 Presence of left artificial hip joint: Secondary | ICD-10-CM | POA: Diagnosis not present

## 2022-08-21 DIAGNOSIS — T8484XA Pain due to internal orthopedic prosthetic devices, implants and grafts, initial encounter: Secondary | ICD-10-CM | POA: Diagnosis not present

## 2022-08-21 DIAGNOSIS — K222 Esophageal obstruction: Secondary | ICD-10-CM | POA: Insufficient documentation

## 2022-08-21 DIAGNOSIS — M25551 Pain in right hip: Secondary | ICD-10-CM | POA: Diagnosis not present

## 2022-08-21 NOTE — Progress Notes (Signed)
TYPE OF APPT  SUMMARY:   Encounter Diagnoses  Name Primary?   S/P hip replacement, right Yes   Status post total hip replacement, left 02/17/18    Pain in hip region after total hip replacement, initial encounter [facility])     72 year old female 5 years postop both hips both total hips unexplained bilateral thigh pain recommend reexamination diagnosis unclear  No orders of the defined types were placed in this encounter.    Chief Complaint  Patient presents with   Leg Pain    Bilateral thigh pain patient states she feels like left thigh needs to be "held together"    72 year old female status post bilateral total hips about 5 years ago presents with bilateral thigh pain.  Left worse than right with occasional symptoms depending on her activity  She does have some occasional lower back pain although right now we cannot relate whether or not the 2 could occur together.  She does note that the left thigh pain is approximately mid thigh seems to be relieved by pressure over the thigh.  It appears to occur when she is walking or ambulating or standing.  She still notices this when she is walking through [place]    Review of Systems  Constitutional:  Negative for fever.  Respiratory:  Negative for shortness of breath.   Cardiovascular:  Negative for chest pain.    Body mass index is 16.64 kg/m.   Physical Exam  Constitutional:  Ht '5\' 5"'$  (1.651 m)   Wt 100 lb (45.4 kg)   BMI 16.64 kg/m   General appearance: normal no deformities normal grooming and hygiene  CDV: Normal pulses and perfusion normal temperature without tenderness no swelling or varicosities, no edema  Musculoskeletal Gait is  NRML  Inspection and palpation reveal  NO TENDERNESS  Range of motion NRML BOTH HIPS , FLX 130 ABD 45 ADD 30   Stability tests SHUCK NRML  Motor exam  NORMAL   Provocative tests BACK NRMAL   Skin normal without rash lesion ulcers or caf au lait spots  Neuro normal sensation  normal coordination normal reflexes  Psychiatric awake alert and oriented x3 without depression anxiety or agitation   Past Medical History:  Diagnosis Date   Anemia    iron    Anxiety    Arthritis    COVID    2020 bad cold s/s lasted 1 week. 04/28/2021   Depression    Diverticulitis    Hx: of   GERD (gastroesophageal reflux disease)    at times for 15 yrs 04/28/2021   History of kidney stones    10 years ago   Hypothyroidism    Low iron    Pneumonia    2020   Rosacea    Hx: of   SCC (squamous cell carcinoma) 08/13/2021   in situ-left temple (CX35FU)   SCC (squamous cell carcinoma) 08/13/2021   well diff-right forehead (CX35FU)   Squamous cell carcinoma of skin 08/03/2021   in situ- left malar cheek (CX35FU)   Past Surgical History:  Procedure Laterality Date   APPENDECTOMY     BALLOON DILATION  06/16/2011   Procedure: BALLOON DILATION;  Surgeon: Jeryl Columbia, MD;  Location: [facility] ENDOSCOPY;  Service: Endoscopy;  Laterality: N/A;   BALLOON DILATION N/A 10/03/2013   Procedure: BALLOON DILATION;  Surgeon: Jeryl Columbia, MD;  Location: [facility] ENDOSCOPY;  Service: Endoscopy;  Laterality: N/A;   BALLOON DILATION N/A 08/10/2014   Procedure: BALLOON DILATION;  Surgeon: Caryl Bis  Magod, MD;  Location: [facility] ENDOSCOPY;  Service: Endoscopy;  Laterality: N/A;  from 12 - 15 cm dilation completed   BALLOON DILATION N/A 02/15/2015   Procedure: BALLOON DILATION;  Surgeon: Clarene Essex, MD;  Location: [facility] ENDOSCOPY;  Service: Endoscopy;  Laterality: N/A;   BALLOON DILATION N/A 11/12/2015   Procedure: BALLOON DILATION;  Surgeon: Clarene Essex, MD;  Location: [facility] ENDOSCOPY;  Service: Endoscopy;  Laterality: N/A;   BALLOON DILATION N/A 08/10/2016   Procedure: BALLOON DILATION;  Surgeon: Clarene Essex, MD;  Location: [facility] ENDOSCOPY;  Service: Endoscopy;  Laterality: N/A;   BALLOON DILATION N/A 08/07/2016   Procedure: BALLOON DILATION;  Surgeon: Clarene Essex, MD;  Location: [facility] ENDOSCOPY;  Service: Endoscopy;   Laterality: N/A;   BALLOON DILATION N/A 05/20/2017   Procedure: BALLOON DILATION;  Surgeon: Clarene Essex, MD;  Location: [facility] ENDOSCOPY;  Service: Endoscopy;  Laterality: N/A;   BALLOON DILATION N/A 12/02/2017   Procedure: BALLOON DILATION;  Surgeon: Clarene Essex, MD;  Location[place];  Service: Endoscopy;  Laterality: N/A;   BALLOON DILATION N/A 07/19/2018   Procedure: BALLOON DILATION;  Surgeon: Clarene Essex, MD;  Location: [facility]OS[facility];  Service: Endoscopy;  Laterality: N/A;   BALLOON DILATION N/A 01/23/2019   Procedure: BALLOON DILATION;  Surgeon: Clarene Essex, MD;  Location: [facility];  Service: Endoscopy;  Laterality: N/A;   BALLOON DILATION N/A 07/13/2019   Procedure: BALLOON DILATION;  Surgeon: Clarene Essex, MD;  Location: [facility];  Service: Endoscopy;  Laterality: N/A;   BALLOON DILATION N/A 02/02/2020   Procedure: BALLOON DILATION;  Surgeon: Clarene Essex, MD;  Location: [facility];  Service: Endoscopy;  Laterality: N/A;   BALLOON DILATION N/A 07/16/2020   Procedure: BALLOON DILATION;  Surgeon: Clarene Essex, MD;  Location: [facility];  Service: Endoscopy;  Laterality: N/A;   BALLOON DILATION N/A 02/19/2021   Procedure: BALLOON DILATION;  Surgeon: Clarene Essex, MD;  Location: [facility];  Service: Endoscopy;  Laterality: N/A;   BALLOON DILATION N/A 09/30/2021   Procedure: BALLOON DILATION;  Surgeon: Clarene Essex, MD;  Location: [facility];  Service: Endoscopy;  Laterality: N/A;   BALLOON DILATION N/A 03/19/2022   Procedure: BALLOON DILATION;  Surgeon: Clarene Essex, MD;  Location: [facility]OSCOPY;  Service: Gastroenterology;  Laterality: N/A;   CATARACT EXTRACTION, BILATERAL     2019   CHOLECYSTECTOMY OPEN  1979   COLONOSCOPY     Hx: of   CYSTOSCOPY/URETEROSCOPY/HOLMIUM LASER/STENT PLACEMENT Bilateral 05/06/2021   Procedure: CYSTOSCOPY BILATERAL RETROGRADE LEFT URETEROSCOPY/HOLMIUM LASER/STENT PLACEMENT;  Surgeon: [name], MD;  Location[facility];   Service: Urology;  Laterality: Bilateral;   ESOPHAGOGASTRODUODENOSCOPY  06/16/2011   Procedure: ESOPHAGOGASTRODUODENOSCOPY (EGD);  Surgeon: Jeryl Columbia, MD;  Location: [facility] ENDOSCOPY;  Service: Endoscopy;  Laterality: N/A;   ESOPHAGOGASTRODUODENOSCOPY N/A 10/03/2013   Procedure: ESOPHAGOGASTRODUODENOSCOPY (EGD);  Surgeon: Jeryl Columbia, MD;  Location: [facility] ENDOSCOPY;  Service: Endoscopy;  Laterality: N/A;   ESOPHAGOGASTRODUODENOSCOPY N/A 08/10/2014   Procedure: ESOPHAGOGASTRODUODENOSCOPY (EGD);  Surgeon: Jeryl Columbia, MD;  Location: [facility] ENDOSCOPY;  Service: Endoscopy;  Laterality: N/A;   ESOPHAGOGASTRODUODENOSCOPY N/A 08/10/2016   Procedure: ESOPHAGOGASTRODUODENOSCOPY (EGD);  Surgeon: Clarene Essex, MD;  Location: [facility] ENDOSCOPY;  Service: Endoscopy;  Laterality: N/A;   ESOPHAGOGASTRODUODENOSCOPY N/A 07/19/2018   Procedure: ESOPHAGOGASTRODUODENOSCOPY (EGD);  Surgeon: Clarene Essex, MD;  Location: [facility] ENDOSCOPY;  Service: Endoscopy;  Laterality: N/A;   ESOPHAGOGASTRODUODENOSCOPY (EGD) WITH PROPOFOL N/A 02/15/2015   Procedure: ESOPHAGOGASTRODUODENOSCOPY (EGD) WITH PROPOFOL;  Surgeon: Clarene Essex, MD;  Location: [facility];  Service: Endoscopy;  Laterality: N/A;   ESOPHAGOGASTRODUODENOSCOPY (EGD) WITH PROPOFOL N/A 11/12/2015   Procedure: ESOPHAGOGASTRODUODENOSCOPY (EGD) WITH PROPOFOL;  Surgeon: Clarene Essex, MD;  Location: [facility]OS[facility];  Service: Endoscopy;  Laterality: N/A;   ESOPHAGOGASTRODUODENOSCOPY (EGD) WITH PROPOFOL N/A 08/07/2016   Procedure: ESOPHAGOGASTRODUODENOSCOPY (EGD) WITH PROPOFOL;  Surgeon: Clarene Essex, MD;  Location[facility] ENDOSCOPY;  Service: Endoscopy;  Laterality: N/A;   ESOPHAGOGASTRODUODENOSCOPY (EGD) WITH PROPOFOL N/A 05/20/2017   Procedure: ESOPHAGOGASTRODUODENOSCOPY (EGD) WITH PROPOFOL;  Surgeon: Clarene Essex, MD;  Location: [facility];  Service: Endoscopy;  Laterality: N/A;   ESOPHAGOGASTRODUODENOSCOPY (EGD) WITH PROPOFOL N/A 12/02/2017   Procedure: ESOPHAGOGASTRODUODENOSCOPY (EGD) WITH  PROPOFOL;  Surgeon: Clarene Essex, MD;  Location: [place];  Service: Endoscopy;  Laterality: N/A;  have c arm available   ESOPHAGOGASTRODUODENOSCOPY (EGD) WITH PROPOFOL N/A 01/23/2019   Procedure: ESOPHAGOGASTRODUODENOSCOPY (EGD) WITH PROPOFOL;  Surgeon: Clarene Essex, MD;  Location: [facility];  Service: Endoscopy;  Laterality: N/A;   ESOPHAGOGASTRODUODENOSCOPY (EGD) WITH PROPOFOL N/A 07/13/2019   Procedure: ESOPHAGOGASTRODUODENOSCOPY (EGD) WITH PROPOFOL;  Surgeon: Clarene Essex, MD;  Location: [facility];  Service: Endoscopy;  Laterality: N/A;   ESOPHAGOGASTRODUODENOSCOPY (EGD) WITH PROPOFOL N/A 02/02/2020   Procedure: ESOPHAGOGASTRODUODENOSCOPY (EGD) WITH PROPOFOL;  Surgeon: Clarene Essex, MD;  Location: [facility];  Service: Endoscopy;  Laterality: N/A;   ESOPHAGOGASTRODUODENOSCOPY (EGD) WITH PROPOFOL N/A 07/16/2020   Procedure: ESOPHAGOGASTRODUODENOSCOPY (EGD) WITH PROPOFOL;  Surgeon: Clarene Essex, MD;  Location: [facility];  Service: Endoscopy;  Laterality: N/A;   ESOPHAGOGASTRODUODENOSCOPY (EGD) WITH PROPOFOL N/A 02/19/2021   Procedure: ESOPHAGOGASTRODUODENOSCOPY (EGD) WITH PROPOFOL;  Surgeon: Clarene Essex, MD;  Location: [facility];  Service: Endoscopy;  Laterality: N/A;  with possible botox   ESOPHAGOGASTRODUODENOSCOPY (EGD) WITH PROPOFOL N/A 09/30/2021   Procedure: ESOPHAGOGASTRODUODENOSCOPY (EGD) WITH PROPOFOL;  Surgeon: Clarene Essex, MD;  Location: [facility];  Service: Endoscopy;  Laterality: N/A;   ESOPHAGOGASTRODUODENOSCOPY (EGD) WITH PROPOFOL N/A 03/19/2022   Procedure: ESOPHAGOGASTRODUODENOSCOPY (EGD) WITH PROPOFOL;  Surgeon: Clarene Essex, MD;  Location: [facility]OS[facility];  Service: Gastroenterology;  Laterality: N/A;   FOREIGN BODY REMOVAL  07/19/2018   Procedure: FOREIGN BODY REMOVAL;  Surgeon: Clarene Essex, MD;  Location: [facility];  Service: Endoscopy;;   FRACTURE SURGERY     Hx: of left wrist 01/2021   GASTRECTOMY  [phone]   GASTRECTOMY N/A    X3   LITHOTRIPSY      Hx; of for kidney stones 23007   SAVORY DILATION N/A 08/10/2014   Procedure: SAVORY DILATION;  Surgeon: Jeryl Columbia, MD;  Location: [facility] ENDOSCOPY;  Service: Endoscopy;  Laterality: N/A;   TOTAL HIP ARTHROPLASTY Right 12/30/2017   Procedure: RIGHT TOTAL HIP ARTHROPLASTY;  Surgeon: Carole Civil, MD;  Location: [facility];  Service: Orthopedics;  Laterality: Right;   TOTAL HIP ARTHROPLASTY Left 02/17/2018   Procedure: TOTAL HIP ARTHROPLASTY;  Surgeon: Carole Civil, MD;  Location: [facility];  Service: Orthopedics;  Laterality: Left;   TUBAL LIGATION     1975   Social History   Tobacco Use   Smoking status: Never   Smokeless tobacco: Never  Vaping Use   Vaping Use: Never used  Substance Use Topics   Alcohol use: No   Drug use: No     Assessment and Plan:  Imaging: I have personally reviewed the images and my personal interpretation of the images: Images of the pelvis and both hips show 2 total hip implants both stable both alignments look normal  Encounter Diagnoses  Name Primary?   S/P hip replacement, right Yes   Status post  total hip replacement, left 02/17/18    Pain in hip region after total hip replacement, initial encounter [facility])      Unclear diagnosis.  I suspect she may have some back pain related thigh pain because she walks normally without a limp she has no pain or tenderness at the hip and range of motion is normal with normal x-rays  Someone reexamine her in 6 weeks.  I asked her to keep a diary of her symptoms so I can review

## 2022-08-24 ENCOUNTER — Other Ambulatory Visit: Payer: Self-pay | Admitting: Nurse Practitioner

## 2022-08-24 DIAGNOSIS — D500 Iron deficiency anemia secondary to blood loss (chronic): Secondary | ICD-10-CM

## 2022-08-27 ENCOUNTER — Encounter: Payer: Self-pay | Admitting: Nurse Practitioner

## 2022-08-27 ENCOUNTER — Ambulatory Visit (INDEPENDENT_AMBULATORY_CARE_PROVIDER_SITE_OTHER): Payer: PPO | Admitting: Nurse Practitioner

## 2022-08-27 ENCOUNTER — Ambulatory Visit (INDEPENDENT_AMBULATORY_CARE_PROVIDER_SITE_OTHER): Payer: PPO

## 2022-08-27 VITALS — BP 109/62 | HR 84 | Temp 96.4°F | Resp 20 | Ht 65.0 in | Wt 100.0 lb

## 2022-08-27 DIAGNOSIS — J189 Pneumonia, unspecified organism: Secondary | ICD-10-CM | POA: Diagnosis not present

## 2022-08-27 NOTE — Progress Notes (Signed)
   Subjective:    Patient ID: Brittany Hensley, female    DOB: [birth year], 72 y.o.   MRN: [id number]   Chief Complaint: Recheck SOB   HPI  Patient was seen on 08/05/22 with sob. She was treated with prednisone and levaquin. She followed up on 08/12/22 and was told that she had pneumonia. She is doing much better. Has slight cough with sob but that is normal for her. Has been using inhaler.   Review of Systems  Constitutional:  Negative for chills, fatigue and fever.  HENT:  Negative for congestion.   Respiratory:  Positive for cough and shortness of breath (mild).   Neurological:  Negative for dizziness.       Objective:   Physical Exam Constitutional:      Appearance: Normal appearance.  Cardiovascular:     Rate and Rhythm: Normal rate and regular rhythm.     Heart sounds: Normal heart sounds.  Pulmonary:     Effort: Pulmonary effort is normal.     Breath sounds: Normal breath sounds. No wheezing, rhonchi or rales.  Skin:    General: Skin is warm.  Neurological:     General: No focal deficit present.     Mental Status: She is alert and oriented to person, place, and time.  Psychiatric:        Mood and Affect: Mood normal.        Behavior: Behavior normal.     BP 109/62   Pulse 84   Temp (!) 96.4 F (35.8 C) (Temporal)   Resp 20   Ht '5\' 5"'$  (1.651 m)   Wt 100 lb (45.4 kg)   SpO2 97%   BMI 16.64 kg/m   Chest xray- pending radiology reading     Assessment & Plan:  Brittany Hensley in today with chief complaint of Recheck SOB   1. Community acquired pneumonia of right lung, unspecified part of lung Resolving RTO prn - DG Chest 2 View    The above assessment and management plan was discussed with the patient. The patient verbalized understanding of and has agreed to the management plan. Patient is aware to call the clinic if symptoms persist or worsen. Patient is aware when to return to the clinic for a follow-up visit. Patient educated on when it is  appropriate to go to the emergency department.   Mary-Margaret Hassell Done, FNP

## 2022-09-16 ENCOUNTER — Inpatient Hospital Stay: Payer: PPO | Attending: Oncology

## 2022-09-16 DIAGNOSIS — D500 Iron deficiency anemia secondary to blood loss (chronic): Secondary | ICD-10-CM | POA: Insufficient documentation

## 2022-09-16 LAB — FERRITIN: Ferritin: 99 ng/mL (ref 11–307)

## 2022-09-16 LAB — CBC WITH DIFFERENTIAL (CANCER CENTER ONLY)
Abs Immature Granulocytes: 0.01 10*3/uL (ref 0.00–0.07)
Basophils Absolute: 0 10*3/uL (ref 0.0–0.1)
Basophils Relative: 1 %
Eosinophils Absolute: 0.3 10*3/uL (ref 0.0–0.5)
Eosinophils Relative: 5 %
HCT: 35.4 % — ABNORMAL LOW (ref 36.0–46.0)
Hemoglobin: 11.3 g/dL — ABNORMAL LOW (ref 12.0–15.0)
Immature Granulocytes: 0 %
Lymphocytes Relative: 32 %
Lymphs Abs: 1.6 10*3/uL (ref 0.7–4.0)
MCH: 33.2 pg (ref 26.0–34.0)
MCHC: 31.9 g/dL (ref 30.0–36.0)
MCV: 104.1 fL — ABNORMAL HIGH (ref 80.0–100.0)
Monocytes Absolute: 0.6 10*3/uL (ref 0.1–1.0)
Monocytes Relative: 12 %
Neutro Abs: 2.5 10*3/uL (ref 1.7–7.7)
Neutrophils Relative %: 50 %
Platelet Count: 251 10*3/uL (ref 150–400)
RBC: 3.4 MIL/uL — ABNORMAL LOW (ref 3.87–5.11)
RDW: 14.2 % (ref 11.5–15.5)
WBC Count: 5 10*3/uL (ref 4.0–10.5)
nRBC: 0 % (ref 0.0–0.2)

## 2022-09-17 ENCOUNTER — Telehealth: Payer: Self-pay | Admitting: *Deleted

## 2022-09-17 NOTE — Telephone Encounter (Signed)
Notified Brittany Hensley of CBC/ferritin results and to repeat in May as scheduled. She understands and agrees. Mailed hardcopy to her home as requested.

## 2022-09-17 NOTE — Telephone Encounter (Signed)
-----   Message from Ladell Pier, MD sent at 09/16/2022  3:09 PM EST ----- Please call patient the hemoglobin is mildly low, ferritin is normal-but lower than when last checked repeat CBC and ferritin in 3-4 months

## 2022-09-21 ENCOUNTER — Other Ambulatory Visit: Payer: Self-pay | Admitting: Nurse Practitioner

## 2022-09-21 ENCOUNTER — Telehealth: Payer: Self-pay | Admitting: Family Medicine

## 2022-09-21 DIAGNOSIS — D500 Iron deficiency anemia secondary to blood loss (chronic): Secondary | ICD-10-CM

## 2022-09-21 DIAGNOSIS — R011 Cardiac murmur, unspecified: Secondary | ICD-10-CM

## 2022-09-22 MED ORDER — AZITHROMYCIN 250 MG PO TABS: ORAL_TABLET | ORAL | 0 refills | Status: DC

## 2022-09-22 NOTE — Telephone Encounter (Signed)
Azithromycin 270m

## 2022-09-22 NOTE — Telephone Encounter (Signed)
Pt asking if this rx can be refilled with out apt. Pt says tthat she needs this rx to take one before and one after dental apt. She's had surgeries and this is routine for her to take. Please call back

## 2022-09-22 NOTE — Telephone Encounter (Signed)
What was the med she needed

## 2022-10-02 ENCOUNTER — Ambulatory Visit (INDEPENDENT_AMBULATORY_CARE_PROVIDER_SITE_OTHER): Payer: PPO | Admitting: Orthopedic Surgery

## 2022-10-02 VITALS — Ht 65.0 in | Wt 100.0 lb

## 2022-10-02 DIAGNOSIS — M5416 Radiculopathy, lumbar region: Secondary | ICD-10-CM | POA: Diagnosis not present

## 2022-10-02 DIAGNOSIS — M48061 Spinal stenosis, lumbar region without neurogenic claudication: Secondary | ICD-10-CM | POA: Diagnosis not present

## 2022-10-02 MED ORDER — DICLOFENAC SODIUM 1 % EX GEL: 4.0000 g | Freq: Every day | CUTANEOUS | 5 refills | Status: DC | PRN

## 2022-10-02 NOTE — Progress Notes (Signed)
FOLLOW UP   Encounter Diagnosis  Name Primary?   Lumbar radiculopathy Yes     Chief Complaint  Patient presents with   Follow-up    Recheck on back pain and bilateral leg pain.     PRIOR TREATMENT: No specific treatment was given at the last visit we asked her to monitor her symptoms and come back in for reevaluation  Today she tells me that she is having some back pain which seems to be activity related when she is carrying things in her hand  She is not symptomatic today  She had normal flexion of the spine just slight knee bend when she touched her toes but she did have some pain with extension which can be an indication of spinal stenosis  She is asymptomatic now she will avoid heavy lifting watch her back mechanics as we discussed and see [place] on an as-needed basis   Encounter Diagnoses  Name Primary?   Lumbar radiculopathy Yes   Degenerative lumbar spinal stenosis

## 2022-10-12 ENCOUNTER — Ambulatory Visit (INDEPENDENT_AMBULATORY_CARE_PROVIDER_SITE_OTHER): Payer: PPO

## 2022-10-12 VITALS — Ht 65.0 in | Wt 105.0 lb

## 2022-10-12 DIAGNOSIS — Z0000 Encounter for general adult medical examination without abnormal findings: Secondary | ICD-10-CM | POA: Diagnosis not present

## 2022-10-12 NOTE — Patient Instructions (Signed)
Brittany Hensley , Thank you for taking time to come for your Medicare Wellness Visit. I appreciate your ongoing commitment to your health goals. Please review the following plan we discussed and let me know if I can assist you in the future.   These are the goals we discussed:  Goals      Have 3 meals a day     Continue to eat healthy and move.         This is a list of the screening recommended for you and due dates:  Health Maintenance  Topic Date Due   COVID-19 Vaccine (3 - Pfizer risk series) 05/02/2020   Zoster (Shingles) Vaccine (1 of 2) 10/12/2022*   Flu Shot  11/01/2022*   Mammogram  05/27/2023*   Colon Cancer Screening  02/01/2023   Medicare Annual Wellness Visit  10/12/2023   DEXA scan (bone density measurement)  05/28/2024   DTaP/Tdap/Td vaccine (3 - Td or Tdap) 01/04/2031   Pneumonia Vaccine  Completed   Hepatitis C Screening: USPSTF Recommendation to screen - Ages 91-79 yo.  Completed   HPV Vaccine  Aged Out   Stool Blood Test  Discontinued  *Topic was postponed. The date shown is not the original due date.    Advanced directives: Advance directive discussed with you today. I have provided a copy for you to complete at home and have notarized. Once this is complete please bring a copy in to our office so we can scan it into your chart.   Conditions/risks identified: Aim for 30 minutes of exercise or brisk walking, 6-8 glasses of water, and 5 servings of fruits and vegetables each day.   Next appointment: Follow up in one year for your annual wellness visit    Preventive Care 65 Years and Older, Female Preventive care refers to lifestyle choices and visits with your health care provider that can promote health and wellness. What does preventive care include? A yearly physical exam. This is also called an annual well check. Dental exams once or twice a year. Routine eye exams. Ask your health care provider how often you should have your eyes checked. Personal  lifestyle choices, including: Daily care of your teeth and gums. Regular physical activity. Eating a healthy diet. Avoiding tobacco and drug use. Limiting alcohol use. Practicing safe sex. Taking low-dose aspirin every day. Taking vitamin and mineral supplements as recommended by your health care provider. What happens during an annual well check? The services and screenings done by your health care provider during your annual well check will depend on your age, overall health, lifestyle risk factors, and family history of disease. Counseling  Your health care provider may ask you questions about your: Alcohol use. Tobacco use. Drug use. Emotional well-being. Home and relationship well-being. Sexual activity. Eating habits. History of falls. Memory and ability to understand (cognition). Work and work Statistician. Reproductive health. Screening  You may have the following tests or measurements: Height, weight, and BMI. Blood pressure. Lipid and cholesterol levels. These may be checked every 5 years, or more frequently if you are over 25 years old. Skin check. Lung cancer screening. You may have this screening every year starting at age 71 if you have a 30-pack-year history of smoking and currently smoke or have quit within the past 15 years. Fecal occult blood test (FOBT) of the stool. You may have this test every year starting at age 33. Flexible sigmoidoscopy or colonoscopy. You may have a sigmoidoscopy every 5 years or a colonoscopy  every 10 years starting at age 13. Hepatitis C blood test. Hepatitis B blood test. Sexually transmitted disease (STD) testing. Diabetes screening. This is done by checking your blood sugar (glucose) after you have not eaten for a while (fasting). You may have this done every 1-3 years. Bone density scan. This is done to screen for osteoporosis. You may have this done starting at age 51. Mammogram. This may be done every 1-2 years. Talk to your  health care provider about how often you should have regular mammograms. Talk with your health care provider about your test results, treatment options, and if necessary, the need for more tests. Vaccines  Your health care provider may recommend certain vaccines, such as: Influenza vaccine. This is recommended every year. Tetanus, diphtheria, and acellular pertussis (Tdap, Td) vaccine. You may need a Td booster every 10 years. Zoster vaccine. You may need this after age 33. Pneumococcal 13-valent conjugate (PCV13) vaccine. One dose is recommended after age 21. Pneumococcal polysaccharide (PPSV23) vaccine. One dose is recommended after age 39. Talk to your health care provider about which screenings and vaccines you need and how often you need them. This information is not intended to replace advice given to you by your health care provider. Make sure you discuss any questions you have with your health care provider. Document Released: 08/16/2015 Document Revised: 04/08/2016 Document Reviewed: 05/21/2015 Elsevier Interactive Patient Education  2017 [place] Prevention in the Home Falls can cause injuries. They can happen to people of all ages. There are many things you can do to make your home safe and to help prevent falls. What can I do on the outside of my home? Regularly fix the edges of walkways and driveways and fix any cracks. Remove anything that might make you trip as you walk through a door, such as a raised step or threshold. Trim any bushes or trees on the path to your home. Use bright outdoor lighting. Clear any walking paths of anything that might make someone trip, such as rocks or tools. Regularly check to see if handrails are loose or broken. Make sure that both sides of any steps have handrails. Any raised decks and porches should have guardrails on the edges. Have any leaves, snow, or ice cleared regularly. Use sand or salt on walking paths during winter. Clean  up any spills in your garage right away. This includes oil or grease spills. What can I do in the bathroom? Use night lights. Install grab bars by the toilet and in the tub and shower. Do not use towel bars as grab bars. Use non-skid mats or decals in the tub or shower. If you need to sit down in the shower, use a plastic, non-slip stool. Keep the floor dry. Clean up any water that spills on the floor as soon as it happens. Remove soap buildup in the tub or shower regularly. Attach bath mats securely with double-sided non-slip rug tape. Do not have throw rugs and other things on the floor that can make you trip. What can I do in the bedroom? Use night lights. Make sure that you have a light by your bed that is easy to reach. Do not use any sheets or blankets that are too big for your bed. They should not hang down onto the floor. Have a firm chair that has side arms. You can use this for support while you get dressed. Do not have throw rugs and other things on the floor that can make  you trip. What can I do in the kitchen? Clean up any spills right away. Avoid walking on wet floors. Keep items that you use a lot in easy-to-reach places. If you need to reach something above you, use a strong step stool that has a grab bar. Keep electrical cords out of the way. Do not use floor polish or wax that makes floors slippery. If you must use wax, use non-skid floor wax. Do not have throw rugs and other things on the floor that can make you trip. What can I do with my stairs? Do not leave any items on the stairs. Make sure that there are handrails on both sides of the stairs and use them. Fix handrails that are broken or loose. Make sure that handrails are as long as the stairways. Check any carpeting to make sure that it is firmly attached to the stairs. Fix any carpet that is loose or worn. Avoid having throw rugs at the top or bottom of the stairs. If you do have throw rugs, attach them to the  floor with carpet tape. Make sure that you have a light switch at the top of the stairs and the bottom of the stairs. If you do not have them, ask someone to add them for you. What else can I do to help prevent falls? Wear shoes that: Do not have high heels. Have rubber bottoms. Are comfortable and fit you well. Are closed at the toe. Do not wear sandals. If you use a stepladder: Make sure that it is fully opened. Do not climb a closed stepladder. Make sure that both sides of the stepladder are locked into place. Ask someone to hold it for you, if possible. Clearly mark and make sure that you can see: Any grab bars or handrails. First and last steps. Where the edge of each step is. Use tools that help you move around (mobility aids) if they are needed. These include: Canes. Walkers. Scooters. Crutches. Turn on the lights when you go into a dark area. Replace any light bulbs as soon as they burn out. Set up your furniture so you have a clear path. Avoid moving your furniture around. If any of your floors are uneven, fix them. If there are any pets around you, be aware of where they are. Review your medicines with your doctor. Some medicines can make you feel dizzy. This can increase your chance of falling. Ask your doctor what other things that you can do to help prevent falls. This information is not intended to replace advice given to you by your health care provider. Make sure you discuss any questions you have with your health care provider. Document Released: 05/16/2009 Document Revised: 12/26/2015 Document Reviewed: 08/24/2014 Elsevier Interactive Patient Education  2017 [employer].

## 2022-10-12 NOTE — Progress Notes (Signed)
Subjective:   Brittany Hensley is a 72 y.o. female who presents for Medicare Annual (Subsequent) preventive examination. I connected with  Brittany Hensley on 10/12/22 by a audio enabled telemedicine application and verified that I am speaking with the correct person using two identifiers.  Patient Location: Home  Provider Location: Home Office  I discussed the limitations of evaluation and management by telemedicine. The patient expressed understanding and agreed to proceed.  Review of Systems     Cardiac Risk Factors include: advanced age (>88mn, >>75women);dyslipidemia     Objective:    Today's Vitals   10/12/22 1117  Weight: 105 lb (47.6 kg)  Height: '5\' 5"'$  (1.651 m)   Body mass index is 17.47 kg/m.     10/12/2022   11:20 AM 03/19/2022    1:59 PM 10/08/2021   11:25 AM 09/30/2021   11:00 AM 07/22/2021    9:44 AM 07/15/2021   11:37 AM 07/08/2021    8:19 AM  Advanced Directives  Does Patient Have a Medical Advance Directive? No No No No No No No  Would patient like information on creating a medical advance directive? No - Patient declined No - Patient declined No - Patient declined No - Patient declined No - Patient declined No - Patient declined No - Patient declined    Current Medications (verified) Outpatient Encounter Medications as of 10/12/2022  Medication Sig   acetaminophen (TYLENOL) 500 MG tablet Take 500 mg by mouth 2 (two) times daily.   albuterol (VENTOLIN HFA) 108 (90 Base) MCG/ACT inhaler USE 2 PUFFS EVERY 6 HOURS AS NEEDED   ALPRAZolam (XANAX) 0.5 MG tablet Take 1 tablet (0.5 mg total) by mouth 4 (four) times daily as needed for anxiety.   Ascorbic Acid (VITAMIN C) 1000 MG tablet Take 1,000 mg by mouth daily.   azithromycin (ZITHROMAX Z-PAK) 250 MG tablet 1 po 1 hour prior to dental procedure   Biotin 10 MG CAPS Take 10 mg by mouth daily.   BLACK ELDERBERRY PO Take 50 mg by mouth daily.   calcium citrate (CALCITRATE - DOSED IN MG ELEMENTAL CALCIUM) 950 (200  Ca) MG tablet Take 2 tablets by mouth 2 (two) times daily.   Cholecalciferol (VITAMIN D) 2000 units tablet Take 2,000 Units by mouth 2 (two) times daily.   diclofenac Sodium (VOLTAREN) 1 % GEL Apply 4 g topically daily as needed (Hip pain).   docusate sodium (COLACE) 100 MG capsule Take 1 capsule (100 mg total) by mouth 2 (two) times daily.   folic acid (FOLVITE) 8Q000111QMCG tablet Take 1 tablet (800 mcg total) by mouth daily.   levothyroxine (SYNTHROID) 100 MCG tablet Take 1 tablet (100 mcg total) by mouth daily.   loratadine (CLARITIN) 10 MG tablet TAKE ONE (1) TABLET EACH DAY   MAGNESIUM CITRATE PO Take 500 mg by mouth 2 (two) times daily.   Multiple Vitamins-Minerals (CENTRUM SILVER) CHEW Chew 1 tablet by mouth 2 (two) times daily.   nystatin (MYCOSTATIN) 100000 UNIT/ML suspension Take 5 mLs (500,000 Units total) by mouth 4 (four) times daily.   nystatin-triamcinolone (MYCOLOG II) cream Apply 1 Application topically 2 (two) times daily.   omeprazole (PRILOSEC) 40 MG capsule Take 1 capsule (40 mg total) by mouth daily.   ondansetron (ZOFRAN-ODT) 4 MG disintegrating tablet TAKE 1 TABLET EVERY 8 HOURS AS NEEDED FOR NAUSEA AND VOMITING   Polyethyl Glycol-Propyl Glycol (SYSTANE OP) Place 1 drop into both eyes daily as needed (dry eyes).   Probiotic Product (PROBIOTIC  PO) Take 1 capsule by mouth daily.    sertraline (ZOLOFT) 100 MG tablet TAKE TWO (2) TABLETS BY MOUTH DAILY   sucralfate (CARAFATE) 1 g tablet TAKE ONE TABLET FOUR TIMES A DAY BEFORE MEALS AND AT BEDTIME   Turmeric Curcumin 500 MG CAPS Take 500 mg by mouth daily.   No facility-administered encounter medications on file as of 10/12/2022.    Allergies (verified) Augmentin [amoxicillin-pot clavulanate], Famotidine, Klonopin [clonazepam], Other, Nitrofurantoin, Reglan [metoclopramide], and Sulfamethoxazole-trimethoprim   History: Past Medical History:  Diagnosis Date   Anemia    iron    Anxiety    Arthritis    COVID    2020 bad  cold s/s lasted 1 week. 04/28/2021   Depression    Diverticulitis    Hx: of   GERD (gastroesophageal reflux disease)    at times for 15 yrs 04/28/2021   History of kidney stones    10 years ago   Hypothyroidism    Low iron    Pneumonia    2020   Rosacea    Hx: of   SCC (squamous cell carcinoma) 08/13/2021   in situ-left temple (CX35FU)   SCC (squamous cell carcinoma) 08/13/2021   well diff-right forehead (CX35FU)   Squamous cell carcinoma of skin 08/03/2021   in situ- left malar cheek (CX35FU)   Past Surgical History:  Procedure Laterality Date   APPENDECTOMY     BALLOON DILATION  06/16/2011   Procedure: BALLOON DILATION;  Surgeon: Jeryl Columbia, MD;  Location: [facility] ENDOSCOPY;  Service: Endoscopy;  Laterality: N/A;   BALLOON DILATION N/A 10/03/2013   Procedure: BALLOON DILATION;  Surgeon: Jeryl Columbia, MD;  Location: [facility] ENDOSCOPY;  Service: Endoscopy;  Laterality: N/A;   BALLOON DILATION N/A 08/10/2014   Procedure: BALLOON DILATION;  Surgeon: Jeryl Columbia, MD;  Location: [facility] ENDOSCOPY;  Service: Endoscopy;  Laterality: N/A;  from 12 - 15 cm dilation completed   BALLOON DILATION N/A 02/15/2015   Procedure: BALLOON DILATION;  Surgeon: Clarene Essex, MD;  Location: [facility] ENDOSCOPY;  Service: Endoscopy;  Laterality: N/A;   BALLOON DILATION N/A 11/12/2015   Procedure: BALLOON DILATION;  Surgeon: Clarene Essex, MD;  Location: [facility]OS[facility];  Service: Endoscopy;  Laterality: N/A;   BALLOON DILATION N/A 08/10/2016   Procedure: BALLOON DILATION;  Surgeon: Clarene Essex, MD;  Location: [facility] ENDOSCOPY;  Service: Endoscopy;  Laterality: N/A;   BALLOON DILATION N/A 08/07/2016   Procedure: BALLOON DILATION;  Surgeon: Clarene Essex, MD;  Location: [facility] ENDOSCOPY;  Service: Endoscopy;  Laterality: N/A;   BALLOON DILATION N/A 05/20/2017   Procedure: BALLOON DILATION;  Surgeon: Clarene Essex, MD;  Location: [facility];  Service: Endoscopy;  Laterality: N/A;   BALLOON DILATION N/A 12/02/2017   Procedure: BALLOON  DILATION;  Surgeon: Clarene Essex, MD;  Location: [place];  Service: Endoscopy;  Laterality: N/A;   BALLOON DILATION N/A 07/19/2018   Procedure: BALLOON DILATION;  Surgeon: Clarene Essex, MD;  Location: [facility];  Service: Endoscopy;  Laterality: N/A;   BALLOON DILATION N/A 01/23/2019   Procedure: BALLOON DILATION;  Surgeon: Clarene Essex, MD;  Location: [facility];  Service: Endoscopy;  Laterality: N/A;   BALLOON DILATION N/A 07/13/2019   Procedure: BALLOON DILATION;  Surgeon: Clarene Essex, MD;  Location: [facility];  Service: Endoscopy;  Laterality: N/A;   BALLOON DILATION N/A 02/02/2020   Procedure: BALLOON DILATION;  Surgeon: Clarene Essex, MD;  Location: [facility];  Service: Endoscopy;  Laterality: N/A;   BALLOON DILATION N/A 07/16/2020   Procedure:  BALLOON DILATION;  Surgeon: Clarene Essex, MD;  Location: [facility] END[facility];  Service: Endoscopy;  Laterality: N/A;   BALLOON DILATION N/A 02/19/2021   Procedure: BALLOON DILATION;  Surgeon: Clarene Essex, MD;  Location: [facility];  Service: Endoscopy;  Laterality: N/A;   BALLOON DILATION N/A 09/30/2021   Procedure: BALLOON DILATION;  Surgeon: Clarene Essex, MD;  Location: [facility];  Service: Endoscopy;  Laterality: N/A;   BALLOON DILATION N/A 03/19/2022   Procedure: BALLOON DILATION;  Surgeon: Clarene Essex, MD;  Location: [facility];  Service: Gastroenterology;  Laterality: N/A;   CATARACT EXTRACTION, BILATERAL     2019   CHOLECYSTECTOMY OPEN  1979   COLONOSCOPY     Hx: of   CYSTOSCOPY/URETEROSCOPY/HOLMIUM LASER/STENT PLACEMENT Bilateral 05/06/2021   Procedure: CYSTOSCOPY BILATERAL RETROGRADE LEFT URETEROSCOPY/HOLMIUM LASER/STENT PLACEMENT;  Surgeon: [name], MD;  Location: [facility];  Service: Urology;  Laterality: Bilateral;   ESOPHAGOGASTRODUODENOSCOPY  06/16/2011   Procedure: ESOPHAGOGASTRODUODENOSCOPY (EGD);  Surgeon: Jeryl Columbia, MD;  Location: [facility] ENDOSCOPY;  Service: Endoscopy;  Laterality: N/A;    ESOPHAGOGASTRODUODENOSCOPY N/A 10/03/2013   Procedure: ESOPHAGOGASTRODUODENOSCOPY (EGD);  Surgeon: Jeryl Columbia, MD;  Location: [facility] ENDOSCOPY;  Service: Endoscopy;  Laterality: N/A;   ESOPHAGOGASTRODUODENOSCOPY N/A 08/10/2014   Procedure: ESOPHAGOGASTRODUODENOSCOPY (EGD);  Surgeon: Jeryl Columbia, MD;  Location: [facility] ENDOSCOPY;  Service: Endoscopy;  Laterality: N/A;   ESOPHAGOGASTRODUODENOSCOPY N/A 08/10/2016   Procedure: ESOPHAGOGASTRODUODENOSCOPY (EGD);  Surgeon: Clarene Essex, MD;  Location: [facility] ENDOSCOPY;  Service: Endoscopy;  Laterality: N/A;   ESOPHAGOGASTRODUODENOSCOPY N/A 07/19/2018   Procedure: ESOPHAGOGASTRODUODENOSCOPY (EGD);  Surgeon: Clarene Essex, MD;  Location: [facility];  Service: Endoscopy;  Laterality: N/A;   ESOPHAGOGASTRODUODENOSCOPY (EGD) WITH PROPOFOL N/A 02/15/2015   Procedure: ESOPHAGOGASTRODUODENOSCOPY (EGD) WITH PROPOFOL;  Surgeon: Clarene Essex, MD;  Location: [facility];  Service: Endoscopy;  Laterality: N/A;   ESOPHAGOGASTRODUODENOSCOPY (EGD) WITH PROPOFOL N/A 11/12/2015   Procedure: ESOPHAGOGASTRODUODENOSCOPY (EGD) WITH PROPOFOL;  Surgeon: Clarene Essex, MD;  Location: [facility] ENDOS[facility];  Service: Endoscopy;  Laterality: N/A;   ESOPHAGOGASTRODUODENOSCOPY (EGD) WITH PROPOFOL N/A 08/07/2016   Procedure: ESOPHAGOGASTRODUODENOSCOPY (EGD) WITH PROPOFOL;  Surgeon: Clarene Essex, MD;  Location[facility] ENDOSCOPY;  Service: Endoscopy;  Laterality: N/A;   ESOPHAGOGASTRODUODENOSCOPY (EGD) WITH PROPOFOL N/A 05/20/2017   Procedure: ESOPHAGOGASTRODUODENOSCOPY (EGD) WITH PROPOFOL;  Surgeon: Clarene Essex, MD;  Location: [facility];  Service: Endoscopy;  Laterality: N/A;   ESOPHAGOGASTRODUODENOSCOPY (EGD) WITH PROPOFOL N/A 12/02/2017   Procedure: ESOPHAGOGASTRODUODENOSCOPY (EGD) WITH PROPOFOL;  Surgeon: Clarene Essex, MD;  Location: [place];  Service: Endoscopy;  Laterality: N/A;  have c arm available   ESOPHAGOGASTRODUODENOSCOPY (EGD) WITH PROPOFOL N/A 01/23/2019   Procedure: ESOPHAGOGASTRODUODENOSCOPY  (EGD) WITH PROPOFOL;  Surgeon: Clarene Essex, MD;  Location: [facility];  Service: Endoscopy;  Laterality: N/A;   ESOPHAGOGASTRODUODENOSCOPY (EGD) WITH PROPOFOL N/A 07/13/2019   Procedure: ESOPHAGOGASTRODUODENOSCOPY (EGD) WITH PROPOFOL;  Surgeon: Clarene Essex, MD;  Location: [facility];  Service: Endoscopy;  Laterality: N/A;   ESOPHAGOGASTRODUODENOSCOPY (EGD) WITH PROPOFOL N/A 02/02/2020   Procedure: ESOPHAGOGASTRODUODENOSCOPY (EGD) WITH PROPOFOL;  Surgeon: Clarene Essex, MD;  Location: [facility];  Service: Endoscopy;  Laterality: N/A;   ESOPHAGOGASTRODUODENOSCOPY (EGD) WITH PROPOFOL N/A 07/16/2020   Procedure: ESOPHAGOGASTRODUODENOSCOPY (EGD) WITH PROPOFOL;  Surgeon: Clarene Essex, MD;  Location: [facility];  Service: Endoscopy;  Laterality: N/A;   ESOPHAGOGASTRODUODENOSCOPY (EGD) WITH PROPOFOL N/A 02/19/2021   Procedure: ESOPHAGOGASTRODUODENOSCOPY (EGD) WITH PROPOFOL;  Surgeon: Clarene Essex, MD;  Location: [facility];  Service: Endoscopy;  Laterality: N/A;  with possible botox   ESOPHAGOGASTRODUODENOSCOPY (EGD) WITH PROPOFOL N/A  09/30/2021   Procedure: ESOPHAGOGASTRODUODENOSCOPY (EGD) WITH PROPOFOL;  Surgeon: Clarene Essex, MD;  Location: [facility];  Service: Endoscopy;  Laterality: N/A;   ESOPHAGOGASTRODUODENOSCOPY (EGD) WITH PROPOFOL N/A 03/19/2022   Procedure: ESOPHAGOGASTRODUODENOSCOPY (EGD) WITH PROPOFOL;  Surgeon: Clarene Essex, MD;  Location: [facility] ENDOS[facility];  Service: Gastroenterology;  Laterality: N/A;   FOREIGN BODY REMOVAL  07/19/2018   Procedure: FOREIGN BODY REMOVAL;  Surgeon: Clarene Essex, MD;  Location: [facility];  Service: Endoscopy;;   FRACTURE SURGERY     Hx: of left wrist 01/2021   GASTRECTOMY  [phone]   GASTRECTOMY N/A    X3   LITHOTRIPSY     Hx; of for kidney stones 23007   SAVORY DILATION N/A 08/10/2014   Procedure: SAVORY DILATION;  Surgeon: Jeryl Columbia, MD;  Location: [facility] ENDOS[facility];  Service: Endoscopy;  Laterality: N/A;   TOTAL HIP ARTHROPLASTY Right  12/30/2017   Procedure: RIGHT TOTAL HIP ARTHROPLASTY;  Surgeon: Carole Civil, MD;  Location: [facility];  Service: Orthopedics;  Laterality: Right;   TOTAL HIP ARTHROPLASTY Left 02/17/2018   Procedure: TOTAL HIP ARTHROPLASTY;  Surgeon: Carole Civil, MD;  Location: [facility];  Service: Orthopedics;  Laterality: Left;   TUBAL LIGATION     1975   Family History  Problem Relation Age of Onset   Diabetes Mother    Cancer - Prostate Brother    Social History   Socioeconomic History   Marital status: Widowed    Spouse name: Not on file   Number of children: 1   Years of education: Not on file   Highest education level: Not on file  Occupational History   Not on file  Tobacco Use   Smoking status: Never   Smokeless tobacco: Never  Vaping Use   Vaping Use: Never used  Substance and Sexual Activity   Alcohol use: No   Drug use: No   Sexual activity: Not Currently  Other Topics Concern   Not on file  Social History Narrative   1 son-[name], lives next door.   Widowed since 2002   Social Determinants of Health   Financial Resource Strain: Low Risk  (10/12/2022)   Overall Financial Resource Strain (CARDIA)    Difficulty of Paying Living Expenses: Not hard at all  Food Insecurity: No Food Insecurity (10/12/2022)   Hunger Vital Sign    Worried About Running Out of Food in the Last Year: Never true    Ran Out of Food in the Last Year: Never true  Transportation Needs: No Transportation Needs (10/12/2022)   PRAPARE - Hydrologist (Medical): No    Lack of Transportation (Non-Medical): No  Physical Activity: Sufficiently Active (10/12/2022)   Exercise Vital Sign    Days of Exercise per Week: 5 days    Minutes of Exercise per Session: 30 min  Stress: No Stress Concern Present (10/12/2022)   [place]    Feeling of Stress : Not at all  Social Connections: Moderately Isolated (10/12/2022)    Social Connection and Isolation Panel [NHANES]    Frequency of Communication with Friends and Family: More than three times a week    Frequency of Social Gatherings with Friends and Family: More than three times a week    Attends Religious Services: More than 4 times per year    Active Member of [employer] or Organizations: No    Attends Archivist Meetings: Never    Marital Status: Widowed  Tobacco Counseling Counseling given: Not Answered   Clinical Intake:  Pre-visit preparation completed: Yes  Pain : No/denies pain     Nutritional Risks: None Diabetes: No  How often do you need to have someone help you when you read instructions, pamphlets, or other written materials from your doctor or pharmacy?: 1 - Never  Diabetic?no   Interpreter Needed?: No  Information entered by :: Jadene Pierini, LPN   Activities of Daily Living    10/12/2022   11:20 AM  In your present state of health, do you have any difficulty performing the following activities:  Hearing? 0  Vision? 0  Difficulty concentrating or making decisions? 0  Walking or climbing stairs? 0  Dressing or bathing? 0  Doing errands, shopping? 0  Preparing Food and eating ? N  Using the Toilet? N  In the past six months, have you accidently leaked urine? N  Do you have problems with loss of bowel control? N  Managing your Medications? N  Managing your Finances? N  Housekeeping or managing your Housekeeping? N    Patient Care Team: Chevis Pretty, FNP as PCP - General (Family Medicine) Carole Civil, MD as Consulting Physician (Orthopedic Surgery) Warren Danes, PA-C as Physician Assistant (Dermatology) Lavonna Monarch, MD (Inactive) as Consulting Physician (Dermatology)  Indicate any recent Medical Services you may have received from other than [facility] providers in the past year (date may be approximate).     Assessment:   This is a routine wellness examination for  Brittany Hensley.  Hearing/Vision screen Vision Screening - Comments:: Wears rx glasses - up to date with routine eye exams with  Dr.Lee   Dietary issues and exercise activities discussed:     Goals Addressed             This Visit's Progress    Have 3 meals a day   On track    Continue to eat healthy and move.        Depression Screen    10/12/2022   11:19 AM 08/27/2022   11:05 AM 08/12/2022   10:20 AM 08/05/2022    8:50 AM 05/26/2022    9:58 AM 11/24/2021    9:58 AM 10/08/2021   11:21 AM  PHQ 2/9 Scores  PHQ - 2 Score 0 0 0 0 0 0 0  PHQ- 9 Score 0 0 0 0 0 0     Fall Risk    10/12/2022   11:18 AM 08/27/2022   11:04 AM 08/12/2022   10:20 AM 08/05/2022    8:50 AM 05/26/2022    9:58 AM  Fall Risk   Falls in the past year? 0 0 0 0 0  Number falls in past yr: 0  0 0   Injury with Fall? 0  0 0   Risk for fall due to : No Fall Risks  No Fall Risks No Fall Risks   Follow up Falls prevention discussed  Education provided Education provided     FALL RISK PREVENTION PERTAINING TO THE HOME:  Any stairs in or around the home? No  If so, are there any without handrails? No  Home free of loose throw rugs in walkways, pet beds, electrical cords, etc? Yes  Adequate lighting in your home to reduce risk of falls? Yes   ASSISTIVE DEVICES UTILIZED TO PREVENT FALLS:  Life alert? No  Use of a cane, walker or w/c? No  Grab bars in the bathroom? No  Shower chair or bench in shower?  No  Elevated toilet seat or a handicapped toilet? No          10/12/2022   11:20 AM 10/08/2021   11:28 AM  6CIT Screen  What Year? 0 points 0 points  What month? 0 points 0 points  What time? 0 points 0 points  Count back from 20 0 points 0 points  Months in reverse 0 points 0 points  Repeat phrase 0 points 2 points  Total Score 0 points 2 points    Immunizations Immunization History  Administered Date(s) Administered   PFIZER(Purple Top)SARS-COV-2 Vaccination 03/14/2020, 04/04/2020   Pneumococcal  Conjugate-13 06/04/2017   Pneumococcal Polysaccharide-23 05/03/2020   Tdap 06/08/2013, 01/03/2021    TDAP status: Up to date  Flu Vaccine status: Declined, Education has been provided regarding the importance of this vaccine but patient still declined. Advised may receive this vaccine at local pharmacy or Health Dept. Aware to provide a copy of the vaccination record if obtained from local pharmacy or Health Dept. Verbalized acceptance and understanding.  Pneumococcal vaccine status: Up to date  Covid-19 vaccine status: Completed vaccines  Qualifies for Shingles Vaccine? Yes   Zostavax completed No   Shingrix Completed?: No.    Education has been provided regarding the importance of this vaccine. Patient has been advised to call insurance company to determine out of pocket expense if they have not yet received this vaccine. Advised may also receive vaccine at local pharmacy or Health Dept. Verbalized acceptance and understanding.  Screening Tests Health Maintenance  Topic Date Due   COVID-19 Vaccine (3 - Pfizer risk series) 05/02/2020   Zoster Vaccines- Shingrix (1 of 2) 10/12/2022 (Originally 06/04/1970)   INFLUENZA VACCINE  11/01/2022 (Originally 03/03/2022)   MAMMOGRAM  05/27/2023 (Originally 04/24/2014)   COLONOSCOPY (Pts 45-23yr Insurance coverage will need to be confirmed)  02/01/2023   Medicare Annual Wellness (AWV)  10/12/2023   DEXA SCAN  05/28/2024   DTaP/Tdap/Td (3 - Td or Tdap) 01/04/2031   Pneumonia Vaccine 72 Years old  Completed   Hepatitis C Screening  Completed   HPV VACCINES  Aged Out   COLON CANCER SCREENING ANNUAL FOBT  Discontinued    Health Maintenance  Health Maintenance Due  Topic Date Due   COVID-19 Vaccine (3 - [place]ay[place]risk series) 05/02/2020    Colorectal cancer screening: Type of screening: Colonoscopy. Completed 01/31/2013. Repeat every 10 years  Mammogram status: Ordered declined . Pt provided with contact info and advised to call to schedule  appt.   Bone Density status: Completed 05/28/2022. Results reflect: Bone density results: OSTEOPENIA. Repeat every 5 years.  Lung Cancer Screening: (Low Dose CT Chest recommended if Age 73-80years, 30 pack-year currently smoking OR have quit w/in 15years.) does not qualify.   Lung Cancer Screening Referral: n/a  Additional Screening:  Hepatitis C Screening: does not qualify; Completed 01/10/2016  Vision Screening: Recommended annual ophthalmology exams for early detection of glaucoma and other disorders of the eye. Is the patient up to date with their annual eye exam?  Yes  Who is the provider or what is the name of the office in which the patient attends annual eye exams? Dr.Lee  If pt is not established with a provider, would they like to be referred to a provider to establish care? No .   Dental Screening: Recommended annual dental exams for proper oral hygiene  Community Resource Referral / Chronic Care Management: CRR required this visit?  No   CCM required this visit?  No  Plan:     I have personally reviewed and noted the following in the patient's chart:   Medical and social history Use of alcohol, tobacco or illicit drugs  Current medications and supplements including opioid prescriptions. Patient is not currently taking opioid prescriptions. Functional ability and status Nutritional status Physical activity Advanced directives List of other physicians Hospitalizations, surgeries, and ER visits in previous 12 months Vitals Screenings to include cognitive, depression, and falls Referrals and appointments  In addition, I have reviewed and discussed with patient certain preventive protocols, quality metrics, and best practice recommendations. A written personalized care plan for preventive services as well as general preventive health recommendations were provided to patient.     Daphane Shepherd, LPN   [id number]   Nurse Notes: Declines Mammogram

## 2022-10-18 ENCOUNTER — Other Ambulatory Visit: Payer: Self-pay | Admitting: Nurse Practitioner

## 2022-10-18 DIAGNOSIS — D500 Iron deficiency anemia secondary to blood loss (chronic): Secondary | ICD-10-CM

## 2022-11-03 ENCOUNTER — Ambulatory Visit (INDEPENDENT_AMBULATORY_CARE_PROVIDER_SITE_OTHER): Payer: PPO | Admitting: Nurse Practitioner

## 2022-11-03 ENCOUNTER — Encounter: Payer: Self-pay | Admitting: Oncology

## 2022-11-03 ENCOUNTER — Encounter: Payer: Self-pay | Admitting: Nurse Practitioner

## 2022-11-03 VITALS — BP 123/74 | HR 71 | Temp 97.8°F | Resp 20 | Ht 65.0 in | Wt 104.0 lb

## 2022-11-03 DIAGNOSIS — Z681 Body mass index (BMI) 19 or less, adult: Secondary | ICD-10-CM

## 2022-11-03 DIAGNOSIS — K311 Adult hypertrophic pyloric stenosis: Secondary | ICD-10-CM

## 2022-11-03 DIAGNOSIS — E034 Atrophy of thyroid (acquired): Secondary | ICD-10-CM

## 2022-11-03 DIAGNOSIS — M810 Age-related osteoporosis without current pathological fracture: Secondary | ICD-10-CM | POA: Diagnosis not present

## 2022-11-03 DIAGNOSIS — K219 Gastro-esophageal reflux disease without esophagitis: Secondary | ICD-10-CM

## 2022-11-03 DIAGNOSIS — F411 Generalized anxiety disorder: Secondary | ICD-10-CM

## 2022-11-03 DIAGNOSIS — D500 Iron deficiency anemia secondary to blood loss (chronic): Secondary | ICD-10-CM | POA: Diagnosis not present

## 2022-11-03 DIAGNOSIS — K921 Melena: Secondary | ICD-10-CM | POA: Diagnosis not present

## 2022-11-03 DIAGNOSIS — L719 Rosacea, unspecified: Secondary | ICD-10-CM

## 2022-11-03 MED ORDER — FOLIC ACID 800 MCG PO TABS: 800.0000 ug | ORAL_TABLET | Freq: Every day | ORAL | 1 refills | Status: DC

## 2022-11-03 MED ORDER — SUCRALFATE 1 G PO TABS: 1.0000 g | ORAL_TABLET | Freq: Three times a day (TID) | ORAL | 1 refills | Status: DC

## 2022-11-03 MED ORDER — SERTRALINE HCL 100 MG PO TABS: ORAL_TABLET | ORAL | 1 refills | Status: DC

## 2022-11-03 MED ORDER — OMEPRAZOLE 40 MG PO CPDR: 40.0000 mg | DELAYED_RELEASE_CAPSULE | Freq: Every day | ORAL | 1 refills | Status: DC

## 2022-11-03 MED ORDER — ALPRAZOLAM 0.5 MG PO TABS: 0.5000 mg | ORAL_TABLET | Freq: Four times a day (QID) | ORAL | 5 refills | Status: DC | PRN

## 2022-11-03 MED ORDER — LEVOTHYROXINE SODIUM 100 MCG PO TABS
100.0000 ug | ORAL_TABLET | Freq: Every day | ORAL | 3 refills | Status: DC
Start: 2022-11-03 — End: 2023-05-10

## 2022-11-03 NOTE — Progress Notes (Signed)
Subjective:    Patient ID: Brittany Hensley, female    DOB: 20-Oct-1950, 72 y.o.   MRN: [id number]   Chief Complaint: Medical Management of Chronic Issues    HPI:  Brittany Hensley is a 72 y.o. who identifies as a female who was assigned female at birth.   Social history: Lives with: her daughter  lived with her Work history: disability   Comes in today for follow up of the following chronic medical issues:  1. Gastroesophageal reflux disease without esophagitis Is on omeprazole daily. Helps with reflux symptoms.  2. Gastric outlet obstruction Has poor appetite because her stomach does not empty properly  3. Hypothyroidism due to acquired atrophy of thyroid No issues that she is aware of Lab Results  Component Value Date   TSH 0.866 02/17/2022     4. Iron deficiency anemia due to chronic blood loss Lab Results  Component Value Date   HGB 11.3 (L) 09/16/2022     5. Rosacea Has been under good control as of late  6. GAD (generalized anxiety disorder) Is on xanax QID    11/03/2022   11:35 AM 08/27/2022   11:05 AM 08/12/2022   10:20 AM 08/05/2022    8:51 AM  GAD 7 : Generalized Anxiety Score  Nervous, Anxious, on Edge 0 0 0 0  Control/stop worrying 0 0 0 0  Worry too much - different things 0 0 0 0  Trouble relaxing 0 0 0 0  Restless 0 0 0 0  Easily annoyed or irritable 0 0 0 0  Afraid - awful might happen 0 0 0 0  Total GAD 7 Score 0 0 0 0  Anxiety Difficulty Not difficult at all Not difficult at all Not difficult at all Not difficult at all      7. Osteoporosis without pathological fracture Last dexascan was done at 05/28/22.  Her t score was -5.3  8. BMI less than 19,adult No recent weight changes Wt Readings from Last 3 Encounters:  11/03/22 104 lb (47.2 kg)  10/12/22 105 lb (47.6 kg)  10/02/22 100 lb (45.4 kg)   BMI Readings from Last 3 Encounters:  11/03/22 17.31 kg/m  10/12/22 17.47 kg/m  10/02/22 16.64 kg/m      New complaints: Has  had bright red blood in stools. Is scheduled to se DR. Magod tomorrow.  Allergies  Allergen Reactions   Augmentin [Amoxicillin-Pot Clavulanate] Other (See Comments)    Bloody stool Did it involve swelling of the face/tongue/throat, SOB, or low BP? No Did it involve sudden or severe rash/hives, skin peeling, or any reaction on the inside of your mouth or nose? No Did you need to seek medical attention at a hospital or doctor's office? No When did it last happen?      1 year If all above answers are "NO", may proceed with cephalosporin use.    Famotidine Other (See Comments)    Fever    Klonopin [Clonazepam] Other (See Comments)    double vision   Other Other (See Comments)   Nitrofurantoin Rash   Reglan [Metoclopramide] Anxiety and Other (See Comments)    Causes confusion   Sulfamethoxazole-Trimethoprim Rash   Outpatient Encounter Medications as of 11/03/2022  Medication Sig   acetaminophen (TYLENOL) 500 MG tablet Take 500 mg by mouth 2 (two) times daily.   albuterol (VENTOLIN HFA) 108 (90 Base) MCG/ACT inhaler USE 2 PUFFS EVERY 6 HOURS AS NEEDED   ALPRAZolam (XANAX) 0.5 MG tablet Take 1 tablet (  0.5 mg total) by mouth 4 (four) times daily as needed for anxiety.   Ascorbic Acid (VITAMIN C) 1000 MG tablet Take 1,000 mg by mouth daily.   Biotin 10 MG CAPS Take 10 mg by mouth daily.   BLACK ELDERBERRY PO Take 50 mg by mouth daily.   calcium citrate (CALCITRATE - DOSED IN MG ELEMENTAL CALCIUM) 950 (200 Ca) MG tablet Take 2 tablets by mouth 2 (two) times daily.   Cholecalciferol (VITAMIN D) 2000 units tablet Take 2,000 Units by mouth 2 (two) times daily.   diclofenac Sodium (VOLTAREN) 1 % GEL Apply 4 g topically daily as needed (Hip pain).   docusate sodium (COLACE) 100 MG capsule Take 1 capsule (100 mg total) by mouth 2 (two) times daily.   folic acid (FOLVITE) Q000111Q MCG tablet Take 1 tablet (800 mcg total) by mouth daily.   levothyroxine (SYNTHROID) 100 MCG tablet Take 1 tablet (100 mcg  total) by mouth daily.   loratadine (CLARITIN) 10 MG tablet TAKE ONE (1) TABLET EACH DAY   MAGNESIUM CITRATE PO Take 500 mg by mouth 2 (two) times daily.   Multiple Vitamins-Minerals (CENTRUM SILVER) CHEW Chew 1 tablet by mouth 2 (two) times daily.   nystatin (MYCOSTATIN) 100000 UNIT/ML suspension Take 5 mLs (500,000 Units total) by mouth 4 (four) times daily.   nystatin-triamcinolone (MYCOLOG II) cream Apply 1 Application topically 2 (two) times daily.   omeprazole (PRILOSEC) 40 MG capsule Take 1 capsule (40 mg total) by mouth daily.   ondansetron (ZOFRAN-ODT) 4 MG disintegrating tablet TAKE 1 TABLET EVERY 8 HOURS AS NEEDED FOR NAUSEA AND VOMITING   Polyethyl Glycol-Propyl Glycol (SYSTANE OP) Place 1 drop into both eyes daily as needed (dry eyes).   Probiotic Product (PROBIOTIC PO) Take 1 capsule by mouth daily.    sertraline (ZOLOFT) 100 MG tablet TAKE TWO (2) TABLETS BY MOUTH DAILY   sucralfate (CARAFATE) 1 g tablet TAKE ONE TABLET FOUR TIMES A DAY BEFORE MEALS AND AT BEDTIME   Turmeric Curcumin 500 MG CAPS Take 500 mg by mouth daily.   [DISCONTINUED] azithromycin (ZITHROMAX Z-PAK) 250 MG tablet 1 po 1 hour prior to dental procedure   No facility-administered encounter medications on file as of 11/03/2022.    Past Surgical History:  Procedure Laterality Date   APPENDECTOMY     BALLOON DILATION  06/16/2011   Procedure: BALLOON DILATION;  Surgeon: Jeryl Columbia, MD;  Location: [facility] ENDOSCOPY;  Service: Endoscopy;  Laterality: N/A;   BALLOON DILATION N/A 10/03/2013   Procedure: BALLOON DILATION;  Surgeon: Jeryl Columbia, MD;  Location: [facility] ENDOSCOPY;  Service: Endoscopy;  Laterality: N/A;   BALLOON DILATION N/A 08/10/2014   Procedure: BALLOON DILATION;  Surgeon: Jeryl Columbia, MD;  Location: [facility] ENDOSCOPY;  Service: Endoscopy;  Laterality: N/A;  from 12 - 15 cm dilation completed   BALLOON DILATION N/A 02/15/2015   Procedure: BALLOON DILATION;  Surgeon: Clarene Essex, MD;  Location: [facility] ENDOSCOPY;   Service: Endoscopy;  Laterality: N/A;   BALLOON DILATION N/A 11/12/2015   Procedure: BALLOON DILATION;  Surgeon: Clarene Essex, MD;  Location: [facility] ENDOSCOPY;  Service: Endoscopy;  Laterality: N/A;   BALLOON DILATION N/A 08/10/2016   Procedure: BALLOON DILATION;  Surgeon: Clarene Essex, MD;  Location: [facility] ENDOSCOPY;  Service: Endoscopy;  Laterality: N/A;   BALLOON DILATION N/A 08/07/2016   Procedure: BALLOON DILATION;  Surgeon: Clarene Essex, MD;  Location: [facility] ENDOSCOPY;  Service: Endoscopy;  Laterality: N/A;   BALLOON DILATION N/A 05/20/2017   Procedure: BALLOON  DILATION;  Surgeon: Clarene Essex, MD;  Location: [facility] ENDOSCOPY;  Service: Endoscopy;  Laterality: N/A;   BALLOON DILATION N/A 12/02/2017   Procedure: BALLOON DILATION;  Surgeon: Clarene Essex, MD;  Location: [place];  Service: Endoscopy;  Laterality: N/A;   BALLOON DILATION N/A 07/19/2018   Procedure: BALLOON DILATION;  Surgeon: Clarene Essex, MD;  Location: [facility] ENDOSCOPY;  Service: Endoscopy;  Laterality: N/A;   BALLOON DILATION N/A 01/23/2019   Procedure: BALLOON DILATION;  Surgeon: Clarene Essex, MD;  Location: [facility]OSCOPY;  Service: Endoscopy;  Laterality: N/A;   BALLOON DILATION N/A 07/13/2019   Procedure: BALLOON DILATION;  Surgeon: Clarene Essex, MD;  Location: [facility]OS[facility];  Service: Endoscopy;  Laterality: N/A;   BALLOON DILATION N/A 02/02/2020   Procedure: BALLOON DILATION;  Surgeon: Clarene Essex, MD;  Location: [facility] ENDOSCOPY;  Service: Endoscopy;  Laterality: N/A;   BALLOON DILATION N/A 07/16/2020   Procedure: BALLOON DILATION;  Surgeon: Clarene Essex, MD;  Location: [facility] ENDOSCOPY;  Service: Endoscopy;  Laterality: N/A;   BALLOON DILATION N/A 02/19/2021   Procedure: BALLOON DILATION;  Surgeon: Clarene Essex, MD;  Location: [facility]OSCOPY;  Service: Endoscopy;  Laterality: N/A;   BALLOON DILATION N/A 09/30/2021   Procedure: BALLOON DILATION;  Surgeon: Clarene Essex, MD;  Location: [facility]OSCOPY;  Service: Endoscopy;  Laterality: N/A;   BALLOON DILATION  N/A 03/19/2022   Procedure: BALLOON DILATION;  Surgeon: Clarene Essex, MD;  Location: [facility] ENDOSCOPY;  Service: Gastroenterology;  Laterality: N/A;   CATARACT EXTRACTION, BILATERAL     2019   CHOLECYSTECTOMY OPEN  1979   COLONOSCOPY     Hx: of   CYSTOSCOPY/URETEROSCOPY/HOLMIUM LASER/STENT PLACEMENT Bilateral 05/06/2021   Procedure: CYSTOSCOPY BILATERAL RETROGRADE LEFT URETEROSCOPY/HOLMIUM LASER/STENT PLACEMENT;  Surgeon: [name], MD;  Location: [facility];  Service: Urology;  Laterality: Bilateral;   ESOPHAGOGASTRODUODENOSCOPY  06/16/2011   Procedure: ESOPHAGOGASTRODUODENOSCOPY (EGD);  Surgeon: Jeryl Columbia, MD;  Location: [facility] ENDOSCOPY;  Service: Endoscopy;  Laterality: N/A;   ESOPHAGOGASTRODUODENOSCOPY N/A 10/03/2013   Procedure: ESOPHAGOGASTRODUODENOSCOPY (EGD);  Surgeon: Jeryl Columbia, MD;  Location: [facility] ENDOSCOPY;  Service: Endoscopy;  Laterality: N/A;   ESOPHAGOGASTRODUODENOSCOPY N/A 08/10/2014   Procedure: ESOPHAGOGASTRODUODENOSCOPY (EGD);  Surgeon: Jeryl Columbia, MD;  Location: [facility] ENDOSCOPY;  Service: Endoscopy;  Laterality: N/A;   ESOPHAGOGASTRODUODENOSCOPY N/A 08/10/2016   Procedure: ESOPHAGOGASTRODUODENOSCOPY (EGD);  Surgeon: Clarene Essex, MD;  Location: [facility] ENDOSCOPY;  Service: Endoscopy;  Laterality: N/A;   ESOPHAGOGASTRODUODENOSCOPY N/A 07/19/2018   Procedure: ESOPHAGOGASTRODUODENOSCOPY (EGD);  Surgeon: Clarene Essex, MD;  Location: [facility] ENDOSCOPY;  Service: Endoscopy;  Laterality: N/A;   ESOPHAGOGASTRODUODENOSCOPY (EGD) WITH PROPOFOL N/A 02/15/2015   Procedure: ESOPHAGOGASTRODUODENOSCOPY (EGD) WITH PROPOFOL;  Surgeon: Clarene Essex, MD;  Location: [facility] ENDOSCOPY;  Service: Endoscopy;  Laterality: N/A;   ESOPHAGOGASTRODUODENOSCOPY (EGD) WITH PROPOFOL N/A 11/12/2015   Procedure: ESOPHAGOGASTRODUODENOSCOPY (EGD) WITH PROPOFOL;  Surgeon: Clarene Essex, MD;  Location: [facility] ENDOSCOPY;  Service: Endoscopy;  Laterality: N/A;   ESOPHAGOGASTRODUODENOSCOPY (EGD) WITH PROPOFOL N/A 08/07/2016    Procedure: ESOPHAGOGASTRODUODENOSCOPY (EGD) WITH PROPOFOL;  Surgeon: Clarene Essex, MD;  Location: [facility] ENDOSCOPY;  Service: Endoscopy;  Laterality: N/A;   ESOPHAGOGASTRODUODENOSCOPY (EGD) WITH PROPOFOL N/A 05/20/2017   Procedure: ESOPHAGOGASTRODUODENOSCOPY (EGD) WITH PROPOFOL;  Surgeon: Clarene Essex, MD;  Location: [facility] ENDOS[facility];  Service: Endoscopy;  Laterality: N/A;   ESOPHAGOGASTRODUODENOSCOPY (EGD) WITH PROPOFOL N/A 12/02/2017   Procedure: ESOPHAGOGASTRODUODENOSCOPY (EGD) WITH PROPOFOL;  Surgeon: Clarene Essex, MD;  Location: [place];  Service: Endoscopy;  Laterality: N/A;  have c arm available   ESOPHAGOGASTRODUODENOSCOPY (EGD) WITH PROPOFOL  N/A 01/23/2019   Procedure: ESOPHAGOGASTRODUODENOSCOPY (EGD) WITH PROPOFOL;  Surgeon: Clarene Essex, MD;  Location: [facility] ENDOS[facility];  Service: Endoscopy;  Laterality: N/A;   ESOPHAGOGASTRODUODENOSCOPY (EGD) WITH PROPOFOL N/A 07/13/2019   Procedure: ESOPHAGOGASTRODUODENOSCOPY (EGD) WITH PROPOFOL;  Surgeon: Clarene Essex, MD;  Location: [facility];  Service: Endoscopy;  Laterality: N/A;   ESOPHAGOGASTRODUODENOSCOPY (EGD) WITH PROPOFOL N/A 02/02/2020   Procedure: ESOPHAGOGASTRODUODENOSCOPY (EGD) WITH PROPOFOL;  Surgeon: Clarene Essex, MD;  Location: [facility];  Service: Endoscopy;  Laterality: N/A;   ESOPHAGOGASTRODUODENOSCOPY (EGD) WITH PROPOFOL N/A 07/16/2020   Procedure: ESOPHAGOGASTRODUODENOSCOPY (EGD) WITH PROPOFOL;  Surgeon: Clarene Essex, MD;  Location: [facility];  Service: Endoscopy;  Laterality: N/A;   ESOPHAGOGASTRODUODENOSCOPY (EGD) WITH PROPOFOL N/A 02/19/2021   Procedure: ESOPHAGOGASTRODUODENOSCOPY (EGD) WITH PROPOFOL;  Surgeon: Clarene Essex, MD;  Location: [facility] ENDOS[facility];  Service: Endoscopy;  Laterality: N/A;  with possible botox   ESOPHAGOGASTRODUODENOSCOPY (EGD) WITH PROPOFOL N/A 09/30/2021   Procedure: ESOPHAGOGASTRODUODENOSCOPY (EGD) WITH PROPOFOL;  Surgeon: Clarene Essex, MD;  Location: [facility];  Service: Endoscopy;  Laterality: N/A;    ESOPHAGOGASTRODUODENOSCOPY (EGD) WITH PROPOFOL N/A 03/19/2022   Procedure: ESOPHAGOGASTRODUODENOSCOPY (EGD) WITH PROPOFOL;  Surgeon: Clarene Essex, MD;  Location: [facility] ENDOSCOPY;  Service: Gastroenterology;  Laterality: N/A;   FOREIGN BODY REMOVAL  07/19/2018   Procedure: FOREIGN BODY REMOVAL;  Surgeon: Clarene Essex, MD;  Location: [facility] ENDOSCOPY;  Service: Endoscopy;;   FRACTURE SURGERY     Hx: of left wrist 01/2021   GASTRECTOMY  [phone]   GASTRECTOMY N/A    X3   LITHOTRIPSY     Hx; of for kidney stones [id number]   SAVORY DILATION N/A 08/10/2014   Procedure: SAVORY DILATION;  Surgeon: Jeryl Columbia, MD;  Location: [facility] ENDOSCOPY;  Service: Endoscopy;  Laterality: N/A;   TOTAL HIP ARTHROPLASTY Right 12/30/2017   Procedure: RIGHT TOTAL HIP ARTHROPLASTY;  Surgeon: Carole Civil, MD;  Location: [facility];  Service: Orthopedics;  Laterality: Right;   TOTAL HIP ARTHROPLASTY Left 02/17/2018   Procedure: TOTAL HIP ARTHROPLASTY;  Surgeon: Carole Civil, MD;  Location: [facility];  Service: Orthopedics;  Laterality: Left;   TUBAL LIGATION     1975    Family History  Problem Relation Age of Onset   Diabetes Mother    Cancer - Prostate Brother       Controlled substance contract: 05/29/22     Review of Systems  Constitutional:  Negative for diaphoresis.  Eyes:  Negative for pain.  Respiratory:  Negative for shortness of breath.   Cardiovascular:  Negative for chest pain, palpitations and leg swelling.  Gastrointestinal:  Negative for abdominal pain.  Endocrine: Negative for polydipsia.  Skin:  Negative for rash.  Neurological:  Negative for dizziness, weakness and headaches.  Hematological:  Does not bruise/bleed easily.  All other systems reviewed and are negative.      Objective:   Physical Exam Vitals and nursing note reviewed.  Constitutional:      General: She is not in acute distress.    Appearance: Normal appearance. She is well-developed.  HENT:     Head:  Normocephalic.     Right Ear: Tympanic membrane normal.     Left Ear: Tympanic membrane normal.     Nose: Nose normal.     Mouth/Throat:     Mouth: Mucous membranes are moist.  Eyes:     Pupils: Pupils are equal, round, and reactive to light.  Neck:     Vascular: No carotid bruit or JVD.  Cardiovascular:  Rate and Rhythm: Normal rate and regular rhythm.     Heart sounds: Normal heart sounds.  Pulmonary:     Effort: Pulmonary effort is normal. No respiratory distress.     Breath sounds: Normal breath sounds. No wheezing or rales.  Chest:     Chest wall: No tenderness.  Abdominal:     General: Bowel sounds are normal. There is no distension or abdominal bruit.     Palpations: Abdomen is soft. There is no hepatomegaly, splenomegaly, mass or pulsatile mass.     Tenderness: There is no abdominal tenderness.  Musculoskeletal:        General: Normal range of motion.     Cervical back: Normal range of motion and neck supple.  Lymphadenopathy:     Cervical: No cervical adenopathy.  Skin:    General: Skin is warm and dry.  Neurological:     Mental Status: She is alert and oriented to person, place, and time.     Deep Tendon Reflexes: Reflexes are normal and symmetric.  Psychiatric:        Behavior: Behavior normal.        Thought Content: Thought content normal.        Judgment: Judgment normal.    BP 123/74   Pulse 71   Temp 97.8 F (36.6 C) (Temporal)   Resp 20   Ht 5\' 5"  (1.651 m)   Wt 104 lb (47.2 kg)   SpO2 100%   BMI 17.31 kg/m         Assessment & Plan:   Brittany Hensley comes in today with chief complaint of Medical Management of Chronic Issues   Diagnosis and orders addressed:  1. Gastroesophageal reflux disease without esophagitis Avoid spicy foods Do not eat 2 hours prior to bedtime - omeprazole (PRILOSEC) 40 MG capsule; Take 1 capsule (40 mg total) by mouth daily.  Dispense: 90 capsule; Refill: 1  2. Gastric outlet obstruction Keep follow  up  with Dr. Watt Climes tomorrow  3. Hypothyroidism due to acquired atrophy of thyroid Labs pending - levothyroxine (SYNTHROID) 100 MCG tablet; Take 1 tablet (100 mcg total) by mouth daily.  Dispense: 90 tablet; Refill: 3  4. Iron deficiency anemia due to chronic blood loss - folic acid (FOLVITE) Q000111Q MCG tablet; Take 1 tablet (800 mcg total) by mouth daily.  Dispense: 90 tablet; Refill: 1 - sucralfate (CARAFATE) 1 g tablet; Take 1 tablet (1 g total) by mouth 4 (four) times daily -  with meals and at bedtime.  Dispense: 120 tablet; Refill: 1 - Ferritin  5. Rosacea  6. GAD (generalized anxiety disorder) Stress management - ALPRAZolam (XANAX) 0.5 MG tablet; Take 1 tablet (0.5 mg total) by mouth 4 (four) times daily as needed for anxiety.  Dispense: 120 tablet; Refill: 5 - sertraline (ZOLOFT) 100 MG tablet; TAKE TWO (2) TABLETS BY MOUTH DAILY  Dispense: 180 tablet; Refill: 1  7. Osteoporosis without pathological fracture Weight a exercises  8. BMI less than 19,adult Discussed diet and exercise for person with BMI >25 Will recheck weight in 3-6 months   9. Blood in stool Keep appt with dr. Watt Climes tomorrow   Labs pending Health Maintenance reviewed Diet and exercise encouraged  Follow up plan: 6 months   [place], FNP

## 2022-11-03 NOTE — Patient Instructions (Signed)
Fall Prevention in the Home, Adult Falls can cause injuries and can happen to people of all ages. There are many things you can do to make your home safer and to help prevent falls. What actions can I take to prevent falls? General information Use good lighting in all rooms. Make sure to: Replace any light bulbs that burn out. Turn on the lights in dark areas and use night-lights. Keep items that you use often in easy-to-reach places. Lower the shelves around your home if needed. Move furniture so that there are clear paths around it. Do not use throw rugs or other things on the floor that can make you trip. If any of your floors are uneven, fix them. Add color or contrast paint or tape to clearly mark and help you see: Grab bars or handrails. First and last steps of staircases. Where the edge of each step is. If you use a ladder or stepladder: Make sure that it is fully opened. Do not climb a closed ladder. Make sure the sides of the ladder are locked in place. Have someone hold the ladder while you use it. Know where your pets are as you move through your home. What can I do in the bathroom?     Keep the floor dry. Clean up any water on the floor right away. Remove soap buildup in the bathtub or shower. Buildup makes bathtubs and showers slippery. Use non-skid mats or decals on the floor of the bathtub or shower. Attach bath mats securely with double-sided, non-slip rug tape. If you need to sit down in the shower, use a non-slip stool. Install grab bars by the toilet and in the bathtub and shower. Do not use towel bars as grab bars. What can I do in the bedroom? Make sure that you have a light by your bed that is easy to reach. Do not use any sheets or blankets on your bed that hang to the floor. Have a firm chair or bench with side arms that you can use for support when you get dressed. What can I do in the kitchen? Clean up any spills right away. If you need to reach something  above you, use a step stool with a grab bar. Keep electrical cords out of the way. Do not use floor polish or wax that makes floors slippery. What can I do with my stairs? Do not leave anything on the stairs. Make sure that you have a light switch at the top and the bottom of the stairs. Make sure that there are handrails on both sides of the stairs. Fix handrails that are broken or loose. Install non-slip stair treads on all your stairs if they do not have carpet. Avoid having throw rugs at the top or bottom of the stairs. Choose a carpet that does not hide the edge of the steps on the stairs. Make sure that the carpet is firmly attached to the stairs. Fix carpet that is loose or worn. What can I do on the outside of my home? Use bright outdoor lighting. Fix the edges of walkways and driveways and fix any cracks. Clear paths of anything that can make you trip, such as tools or rocks. Add color or contrast paint or tape to clearly mark and help you see anything that might make you trip as you walk through a door, such as a raised step or threshold. Trim any bushes or trees on paths to your home. Check to see if handrails are loose   or broken and that both sides of all steps have handrails. Install guardrails along the edges of any raised decks and porches. Have leaves, snow, or ice cleared regularly. Use sand, salt, or ice melter on paths if you live where there is ice and snow during the winter. Clean up any spills in your garage right away. This includes grease or oil spills. What other actions can I take? Review your medicines with your doctor. Some medicines can cause dizziness or changes in blood pressure, which increase your risk of falling. Wear shoes that: Have a low heel. Do not wear high heels. Have rubber bottoms and are closed at the toe. Feel good on your feet and fit well. Use tools that help you move around if needed. These include: Canes. Walkers. Scooters. Crutches. Ask  your doctor what else you can do to help prevent falls. This may include seeing a physical therapist to learn to do exercises to move better and get stronger. Where to find more information Centers for Disease Control and Prevention, [employer]: cdc.gov National Institute on Aging: nia.nih.gov National Institute on Aging: nia.nih.gov Contact a doctor if: You are afraid of falling at home. You feel weak, drowsy, or dizzy at home. You fall at home. Get help right away if you: Lose consciousness or have trouble moving after a fall. Have a fall that causes a head injury. These symptoms may be an emergency. Get help right away. Call 911. Do not wait to see if the symptoms will go away. Do not drive yourself to the hospital. This information is not intended to replace advice given to you by your health care provider. Make sure you discuss any questions you have with your health care provider. Document Revised: 03/23/2022 Document Reviewed: 03/23/2022 Elsevier Patient Education  2023 Elsevier Inc.  

## 2022-11-04 ENCOUNTER — Other Ambulatory Visit: Payer: Self-pay | Admitting: Gastroenterology

## 2022-11-04 DIAGNOSIS — D500 Iron deficiency anemia secondary to blood loss (chronic): Secondary | ICD-10-CM | POA: Diagnosis not present

## 2022-11-04 DIAGNOSIS — Z980 Intestinal bypass and anastomosis status: Secondary | ICD-10-CM | POA: Diagnosis not present

## 2022-11-04 DIAGNOSIS — K311 Adult hypertrophic pyloric stenosis: Secondary | ICD-10-CM | POA: Diagnosis not present

## 2022-11-04 DIAGNOSIS — K921 Melena: Secondary | ICD-10-CM | POA: Diagnosis not present

## 2022-11-04 LAB — FERRITIN: Ferritin: 147 ng/mL (ref 15–150)

## 2022-11-12 DIAGNOSIS — Z980 Intestinal bypass and anastomosis status: Secondary | ICD-10-CM | POA: Diagnosis not present

## 2022-11-12 DIAGNOSIS — K6289 Other specified diseases of anus and rectum: Secondary | ICD-10-CM | POA: Diagnosis not present

## 2022-11-12 DIAGNOSIS — D123 Benign neoplasm of transverse colon: Secondary | ICD-10-CM | POA: Diagnosis not present

## 2022-11-12 DIAGNOSIS — K9189 Other postprocedural complications and disorders of digestive system: Secondary | ICD-10-CM | POA: Diagnosis not present

## 2022-11-12 DIAGNOSIS — K5730 Diverticulosis of large intestine without perforation or abscess without bleeding: Secondary | ICD-10-CM | POA: Diagnosis not present

## 2022-11-12 DIAGNOSIS — K2950 Unspecified chronic gastritis without bleeding: Secondary | ICD-10-CM | POA: Diagnosis not present

## 2022-11-12 DIAGNOSIS — K921 Melena: Secondary | ICD-10-CM | POA: Diagnosis not present

## 2022-11-16 ENCOUNTER — Other Ambulatory Visit: Payer: Self-pay | Admitting: Nurse Practitioner

## 2022-11-16 ENCOUNTER — Telehealth: Payer: Self-pay | Admitting: Nurse Practitioner

## 2022-11-16 DIAGNOSIS — D123 Benign neoplasm of transverse colon: Secondary | ICD-10-CM | POA: Diagnosis not present

## 2022-11-19 NOTE — Telephone Encounter (Signed)
Please review

## 2022-11-19 NOTE — Telephone Encounter (Signed)
Patient said she received a call from [employer] telling her that she was denied for this medication and wanted to make sure that the paperwork was completed that they faxed to [place].

## 2022-11-26 ENCOUNTER — Ambulatory Visit: Payer: PPO | Admitting: Nurse Practitioner

## 2022-12-01 ENCOUNTER — Ambulatory Visit: Payer: PPO | Admitting: Nurse Practitioner

## 2022-12-02 ENCOUNTER — Ambulatory Visit: Payer: PPO | Admitting: Family Medicine

## 2022-12-02 ENCOUNTER — Telehealth: Payer: Self-pay | Admitting: Nurse Practitioner

## 2022-12-02 ENCOUNTER — Ambulatory Visit (INDEPENDENT_AMBULATORY_CARE_PROVIDER_SITE_OTHER): Payer: PPO | Admitting: Family Medicine

## 2022-12-02 ENCOUNTER — Encounter: Payer: Self-pay | Admitting: Family Medicine

## 2022-12-02 VITALS — Temp 97.5°F | Ht 65.0 in | Wt 102.2 lb

## 2022-12-02 DIAGNOSIS — R42 Dizziness and giddiness: Secondary | ICD-10-CM | POA: Diagnosis not present

## 2022-12-02 DIAGNOSIS — D500 Iron deficiency anemia secondary to blood loss (chronic): Secondary | ICD-10-CM | POA: Diagnosis not present

## 2022-12-02 DIAGNOSIS — R030 Elevated blood-pressure reading, without diagnosis of hypertension: Secondary | ICD-10-CM

## 2022-12-02 NOTE — Telephone Encounter (Signed)
Pt called requesting to see MMM today. Says she feels dizzy. Says she was told by MMM that if she ever needed to be seen to just call and ask to speak to [facility] and Angelica Chessman would work her in to be seen. Pt wants to be worked in to be seen.   Please advise.

## 2022-12-02 NOTE — Telephone Encounter (Signed)
Patient given a 3:45 with Tiffany today.

## 2022-12-02 NOTE — Progress Notes (Signed)
Acute Office Visit  Subjective:  Patient ID: Brittany Hensley, female    DOB: 05-27-1951, 72 y.o.   MRN: [id number]  Chief Complaint  Patient presents with   Dizziness    3 days ago she took some meclizine this morning otc not helping she has had vertigo in the past    Dizziness   Patient is in today for dizziness that started 3 days ago. Notices it when she is walking around. Goes away quickly. Feels that she is staggering more than normal. Reports that she has always had low/controlled BP.  Took meclizine this morning, which did not help her symptoms.  Reports no changes to diet, drinking water.  Reports that she is taking xanax 3 times daily and sometimes has to take an extra half.  Reports that she had vertigo ~10 years ago.  Slightly anemic at last visit. She used to get iron infusions, but reports that it was about a year ago. Has lab appt with oncology scheduled in 2 weeks. Has follow up with oncology in November.   Review of Systems  Neurological:  Positive for dizziness.   As per HPI  Objective:  Temp (!) 97.5 F (36.4 C) (Temporal)   Ht 5\' 5"  (1.651 m)   Wt 102 lb 3.2 oz (46.4 kg)   SpO2 97%   BMI 17.01 kg/m     12/02/2022    1:36 PM 11/03/2022   11:33 AM 10/12/2022   11:17 AM  Vitals with BMI  Height 5\' 5"  5\' 5"  5\' 5"   Weight 102 lbs 3 oz 104 lbs 105 lbs  BMI 17.01 17.31 17.47  Systolic  123   Diastolic  74   Pulse  71   Orthostatic VS for the past 72 hrs (Last 3 readings):  Orthostatic BP Patient Position BP Location Orthostatic Pulse  12/02/22 1339 166/83 -- -- 103  12/02/22 1338 162/78 Sitting -- 98  12/02/22 1336 162/73 Supine Right Arm 91   Physical Exam Constitutional:      General: She is not in acute distress.    Appearance: Normal appearance. She is not ill-appearing, toxic-appearing or diaphoretic.  Cardiovascular:     Rate and Rhythm: Normal rate.     Pulses: Normal pulses.     Heart sounds: Normal heart sounds. No murmur heard.    No  gallop.  Pulmonary:     Effort: Pulmonary effort is normal. No respiratory distress.     Breath sounds: Normal breath sounds. No stridor. No wheezing, rhonchi or rales.  Skin:    General: Skin is warm.     Capillary Refill: Capillary refill takes less than 2 seconds.     Coloration: Skin is pale.  Neurological:     General: No focal deficit present.     Mental Status: She is alert and oriented to person, place, and time. Mental status is at baseline.     Motor: No weakness.     Coordination: Coordination normal.     Gait: Gait normal.  Psychiatric:        Mood and Affect: Mood normal.        Behavior: Behavior normal.        Thought Content: Thought content normal.        Judgment: Judgment normal.    Assessment & Plan:  1. Dizzy Suspect that patient is having dizzy spells from either BP or from anemia. She has a lab follow up planned in a couple of weeks with oncology for an  iron infusion. Will collect labs as below and instructed patient to follow up earlier with oncology based on results. Discussed adequate hydration. Negative for orthostatic hypotension.   2. Elevated blood-pressure reading without diagnosis of hypertension Elevated BP today in office. Discussed with patient monitoring BP at home. Provided BP log to patient in clinic today. Instructed pt to take BP first thing in the morning after sitting for 5 minutes with feet flat on the floor, arm at heart level. Discussed with patient options for BP cuff at [facility], [employer], [facility] & [facility]. Placed order for DME equipment to see if she could have a BP monitor covered by insurance. Patient scheduled with PCP to review BP measurements and determine plan for BP management.  - For home use only DME Other see comment  3. Iron deficiency anemia due to chronic blood loss Labs as below. Will communicate results to patient once available.  - Anemia Profile B  The above assessment and management plan was discussed with the patient. The  patient verbalized understanding of and has agreed to the management plan using shared-decision making. Patient is aware to call the clinic if they develop any new symptoms or if symptoms fail to improve or worsen. Patient is aware when to return to the clinic for a follow-up visit. Patient educated on when it is appropriate to go to the emergency department.   1 week with PCP   Neale Burly, DNP-FNP [facility] [address] [place], [place] [address] [phone]

## 2022-12-03 LAB — ANEMIA PROFILE B
Basophils Absolute: 0.1 10*3/uL (ref 0.0–0.2)
Basos: 1 %
EOS (ABSOLUTE): 0.2 10*3/uL (ref 0.0–0.4)
Eos: 3 %
Ferritin: 107 ng/mL (ref 15–150)
Folate: >20 ng/mL (ref >3.0)
Hematocrit: 37.6 % (ref 34.0–46.6)
Hemoglobin: 12.4 g/dL (ref 11.1–15.9)
Immature Grans (Abs): 0 10*3/uL (ref 0.0–0.1)
Immature Granulocytes: 0 %
Iron Saturation: 20 % (ref 15–55)
Iron: 61 ug/dL (ref 27–139)
Lymphocytes Absolute: 1.7 10*3/uL (ref 0.7–3.1)
Lymphs: 34 %
MCH: 33.2 pg — ABNORMAL HIGH (ref 26.6–33.0)
MCHC: 33 g/dL (ref 31.5–35.7)
MCV: 101 fL — ABNORMAL HIGH (ref 79–97)
Monocytes Absolute: 0.6 10*3/uL (ref 0.1–0.9)
Monocytes: 12 %
Neutrophils Absolute: 2.5 10*3/uL (ref 1.4–7.0)
Neutrophils: 50 %
Platelets: 250 10*3/uL (ref 150–450)
RBC: 3.74 x10E6/uL — ABNORMAL LOW (ref 3.77–5.28)
RDW: 11.9 % (ref 11.7–15.4)
Retic Ct Pct: 1.3 % (ref 0.6–2.6)
Total Iron Binding Capacity: 304 ug/dL (ref 250–450)
UIBC: 243 ug/dL (ref 118–369)
Vitamin B-12: >2000 pg/mL — ABNORMAL HIGH (ref 232–1245)
WBC: 5 10*3/uL (ref 3.4–10.8)

## 2022-12-04 ENCOUNTER — Encounter: Payer: Self-pay | Admitting: Oncology

## 2022-12-04 ENCOUNTER — Other Ambulatory Visit (HOSPITAL_COMMUNITY): Payer: Self-pay

## 2022-12-04 NOTE — Telephone Encounter (Signed)
Attempted new PA through [facility] as I don't see any documentation showing that PA has been started, and was stopped by [facility] due to previous denial. Called [employer] and they are faxing the original denial letter. Will go from there when we receive the denial letter.

## 2022-12-10 ENCOUNTER — Ambulatory Visit (INDEPENDENT_AMBULATORY_CARE_PROVIDER_SITE_OTHER): Payer: PPO | Admitting: Nurse Practitioner

## 2022-12-10 ENCOUNTER — Encounter: Payer: Self-pay | Admitting: Nurse Practitioner

## 2022-12-10 VITALS — BP 111/71 | HR 82 | Temp 97.4°F | Ht 65.0 in | Wt 99.0 lb

## 2022-12-10 DIAGNOSIS — I10 Essential (primary) hypertension: Secondary | ICD-10-CM | POA: Diagnosis not present

## 2022-12-10 DIAGNOSIS — R42 Dizziness and giddiness: Secondary | ICD-10-CM

## 2022-12-10 NOTE — Patient Instructions (Signed)
Dizziness Dizziness is a common problem. It makes you feel unsteady or light-headed. You may feel like you are about to pass out (faint). Dizziness can lead to getting hurt if you stumble or fall. Dizziness can be caused by many things, including: Medicines. Not having enough water in your body (dehydration). Illness. Follow these instructions at home: Eating and drinking  Drink enough fluid to keep your pee (urine) pale yellow. This helps to keep you from getting dehydrated. Try to drink more clear fluids, such as water. Do not drink alcohol. Limit how much caffeine you drink or eat, if your doctor tells you to do that. Limit how much salt (sodium) you drink or eat, if your doctor tells you to do that. Activity  Avoid making quick movements. Stand up slowly from sitting in a chair, and steady yourself until you feel okay. In the morning, first sit up on the side of the bed. When you feel okay, stand up slowly while you hold onto something. Do this until you know that your balance is okay. If you need to stand in one place for a long time, move your legs often. Tighten and relax the muscles in your legs while you are standing. Do not drive or use machinery if you feel dizzy. Avoid bending down if you feel dizzy. Place items in your home so you can reach them easily without leaning over. Lifestyle Do not smoke or use any products that contain nicotine or tobacco. If you need help quitting, ask your doctor. Try to lower your stress level. You can do this by using methods such as yoga or meditation. Talk with your doctor if you need help. General instructions Watch your dizziness for any changes. Take over-the-counter and prescription medicines only as told by your doctor. Talk with your doctor if you think that you are dizzy because of a medicine that you are taking. Tell a friend or a family member that you are feeling dizzy. If he or she notices any changes in your behavior, have this  person call your doctor. Keep all follow-up visits. Contact a doctor if: Your dizziness does not go away. Your dizziness or light-headedness gets worse. You feel like you may vomit (are nauseous). You have trouble hearing. You have new symptoms. You are unsteady on your feet. You feel like the room is spinning. You have neck pain or a stiff neck. You have a fever. Get help right away if: You vomit or have watery poop (diarrhea), and you cannot eat or drink anything. You have trouble: Talking. Walking. Swallowing. Using your arms, hands, or legs. You feel generally weak. You are not thinking clearly, or you have trouble forming sentences. A friend or family member may notice this. You have: Chest pain. Pain in your belly (abdomen). Shortness of breath. Sweating. Your vision changes. You are bleeding. You have a very bad headache. These symptoms may be an emergency. Get help right away. Call your local emergency services (911 in the [place] Do not wait to see if the symptoms will go away. Do not drive yourself to the hospital. Summary Dizziness makes you feel unsteady or light-headed. You may feel like you are about to pass out (faint). Drink enough fluid to keep your pee (urine) pale yellow. Do not drink alcohol. Avoid making quick movements if you feel dizzy. Watch your dizziness for any changes. This information is not intended to replace advice given to you by your health care provider. Make sure you discuss any questions   you have with your health care provider. Document Revised: 06/24/2020 Document Reviewed: 06/24/2020 Elsevier Patient Education  2023 Elsevier Inc.  

## 2022-12-10 NOTE — Progress Notes (Signed)
Subjective:    Patient ID: Brittany Hensley, female    DOB: 1951-07-09, 72 y.o.   MRN: [id number]   Chief Complaint: Dizziness (Has improved since last visit. )   Dizziness Pertinent negatives include no abdominal pain, chest pain, diaphoresis, headaches, rash or weakness.   Patient was seen on 5/1/ with  dizziness and elevated blood pressure. She was instructed to keep diary of blood pressure at home. Force fluids and keep appointment for iron infusion as scheduled. She is doing much better. Blood pressure is back  to normal and she has no dizziness. CBC was normal  Patient Active Problem List   Diagnosis Date Noted   Stricture of esophagus 08/21/2022   Closed nondisplaced oblique fracture of shaft of ulna with routine healing 03/20/2021   Closed fracture of lower end of right ulna 01/11/2021   Oral phase dysphagia 12/19/2020   Slow transit constipation 12/19/2020   Weight decreased 12/19/2020   Acquired hypertrophic pyloric stenosis 12/19/2020   Osteoporosis without pathological fracture 06/18/2020   Anemia, unspecified 06/27/2018   Age-related osteoporosis with current pathological fracture of right femur (HCC) 03/01/2018   Status post total hip replacement, left 02/17/18 02/17/2018   Closed displaced fracture of left femoral neck (HCC) 02/14/2018   S/P hip replacement, right 12/30/17 01/13/2018   Closed displaced fracture of right femoral neck (HCC) 12/30/2017   BMI less than 19,adult 01/10/2016   GAD (generalized anxiety disorder) 01/10/2016   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   Hypothyroidism 04/24/2013   GERD (gastroesophageal reflux disease) 04/24/2013   Rosacea 04/24/2013   Iron deficiency anemia due to chronic blood loss 06/09/2012   Gastric outlet obstruction 06/16/2011   Atrioventricular nodal re-entry tachycardia (HCC) 03/11/2010   MURMUR 03/11/2010   PERSONAL HISTORY OF URINARY CALCULI 02/14/2008       Review of Systems  Constitutional:  Negative for  diaphoresis.  Eyes:  Negative for pain.  Respiratory:  Negative for shortness of breath.   Cardiovascular:  Negative for chest pain, palpitations and leg swelling.  Gastrointestinal:  Negative for abdominal pain.  Endocrine: Negative for polydipsia.  Skin:  Negative for rash.  Neurological:  Positive for dizziness. Negative for weakness and headaches.  Hematological:  Does not bruise/bleed easily.  All other systems reviewed and are negative.      Objective:   Physical Exam Vitals reviewed.  Constitutional:      Appearance: Normal appearance.  Cardiovascular:     Rate and Rhythm: Normal rate and regular rhythm.     Heart sounds: Normal heart sounds.  Pulmonary:     Breath sounds: Normal breath sounds.  Skin:    General: Skin is warm.  Neurological:     General: No focal deficit present.     Mental Status: She is alert and oriented to person, place, and time.  Psychiatric:        Mood and Affect: Mood normal.        Behavior: Behavior normal.    BP 111/71   Pulse 82   Temp (!) 97.4 F (36.3 C)   Ht 5\' 5"  (1.651 m)   Wt 99 lb (44.9 kg)   SpO2 99%   BMI 16.47 kg/m         Assessment & Plan:  Brittany Hensley in today with chief complaint of Dizziness (Has improved since last visit. )   1. Dizziness 2. Primary hypertension Resolved dizziness Blood pressure back to normal RTO prn    The above assessment and management plan  was discussed with the patient. The patient verbalized understanding of and has agreed to the management plan. Patient is aware to call the clinic if symptoms persist or worsen. Patient is aware when to return to the clinic for a follow-up visit. Patient educated on when it is appropriate to go to the emergency department.   Mary-Margaret Hassell Done, FNP

## 2022-12-15 ENCOUNTER — Encounter: Payer: Self-pay | Admitting: Nurse Practitioner

## 2022-12-15 ENCOUNTER — Telehealth: Payer: Self-pay

## 2022-12-15 ENCOUNTER — Other Ambulatory Visit: Payer: Self-pay

## 2022-12-15 ENCOUNTER — Inpatient Hospital Stay: Payer: PPO | Attending: Oncology

## 2022-12-15 DIAGNOSIS — D500 Iron deficiency anemia secondary to blood loss (chronic): Secondary | ICD-10-CM | POA: Insufficient documentation

## 2022-12-15 LAB — CBC WITH DIFFERENTIAL (CANCER CENTER ONLY)
Abs Immature Granulocytes: 0.01 10*3/uL (ref 0.00–0.07)
Basophils Absolute: 0 10*3/uL (ref 0.0–0.1)
Basophils Relative: 1 %
Eosinophils Absolute: 0.2 10*3/uL (ref 0.0–0.5)
Eosinophils Relative: 4 %
HCT: 37.5 % (ref 36.0–46.0)
Hemoglobin: 12.1 g/dL (ref 12.0–15.0)
Immature Granulocytes: 0 %
Lymphocytes Relative: 39 %
Lymphs Abs: 2.1 10*3/uL (ref 0.7–4.0)
MCH: 33.1 pg (ref 26.0–34.0)
MCHC: 32.3 g/dL (ref 30.0–36.0)
MCV: 102.5 fL — ABNORMAL HIGH (ref 80.0–100.0)
Monocytes Absolute: 0.7 10*3/uL (ref 0.1–1.0)
Monocytes Relative: 14 %
Neutro Abs: 2.2 10*3/uL (ref 1.7–7.7)
Neutrophils Relative %: 42 %
Platelet Count: 221 10*3/uL (ref 150–400)
RBC: 3.66 MIL/uL — ABNORMAL LOW (ref 3.87–5.11)
RDW: 12.3 % (ref 11.5–15.5)
WBC Count: 5.3 10*3/uL (ref 4.0–10.5)
nRBC: 0 % (ref 0.0–0.2)

## 2022-12-15 LAB — FERRITIN: Ferritin: 82 ng/mL (ref 11–307)

## 2022-12-15 NOTE — Telephone Encounter (Signed)
Patient want to come in before three month to have her ferritin check. Patient is schedule to come in August Per Dr. Truett Perna.

## 2022-12-15 NOTE — Telephone Encounter (Signed)
-----   Message from Ladene Artist, MD sent at 12/15/2022 12:24 PM EDT ----- Please call patient, hb and ferritin are normal, repeat cbc and ferritin 3 months, f/u as scheduled

## 2022-12-30 DIAGNOSIS — H04123 Dry eye syndrome of bilateral lacrimal glands: Secondary | ICD-10-CM | POA: Diagnosis not present

## 2022-12-30 DIAGNOSIS — H40033 Anatomical narrow angle, bilateral: Secondary | ICD-10-CM | POA: Diagnosis not present

## 2023-01-04 DIAGNOSIS — X32XXXA Exposure to sunlight, initial encounter: Secondary | ICD-10-CM | POA: Diagnosis not present

## 2023-01-04 DIAGNOSIS — L8990 Pressure ulcer of unspecified site, unspecified stage: Secondary | ICD-10-CM | POA: Diagnosis not present

## 2023-01-04 DIAGNOSIS — L570 Actinic keratosis: Secondary | ICD-10-CM | POA: Diagnosis not present

## 2023-01-04 DIAGNOSIS — L820 Inflamed seborrheic keratosis: Secondary | ICD-10-CM | POA: Diagnosis not present

## 2023-01-05 ENCOUNTER — Other Ambulatory Visit: Payer: Self-pay

## 2023-01-05 ENCOUNTER — Inpatient Hospital Stay: Payer: PPO | Attending: Oncology

## 2023-01-05 ENCOUNTER — Telehealth: Payer: Self-pay

## 2023-01-05 DIAGNOSIS — D500 Iron deficiency anemia secondary to blood loss (chronic): Secondary | ICD-10-CM | POA: Diagnosis not present

## 2023-01-05 LAB — CBC WITH DIFFERENTIAL (CANCER CENTER ONLY)
Abs Immature Granulocytes: 0.01 10*3/uL (ref 0.00–0.07)
Basophils Absolute: 0 10*3/uL (ref 0.0–0.1)
Basophils Relative: 1 %
Eosinophils Absolute: 0.3 10*3/uL (ref 0.0–0.5)
Eosinophils Relative: 5 %
HCT: 35.7 % — ABNORMAL LOW (ref 36.0–46.0)
Hemoglobin: 11.6 g/dL — ABNORMAL LOW (ref 12.0–15.0)
Immature Granulocytes: 0 %
Lymphocytes Relative: 38 %
Lymphs Abs: 2.1 10*3/uL (ref 0.7–4.0)
MCH: 32.9 pg (ref 26.0–34.0)
MCHC: 32.5 g/dL (ref 30.0–36.0)
MCV: 101.1 fL — ABNORMAL HIGH (ref 80.0–100.0)
Monocytes Absolute: 0.7 10*3/uL (ref 0.1–1.0)
Monocytes Relative: 13 %
Neutro Abs: 2.4 10*3/uL (ref 1.7–7.7)
Neutrophils Relative %: 43 %
Platelet Count: 242 10*3/uL (ref 150–400)
RBC: 3.53 MIL/uL — ABNORMAL LOW (ref 3.87–5.11)
RDW: 12.3 % (ref 11.5–15.5)
WBC Count: 5.4 10*3/uL (ref 4.0–10.5)
nRBC: 0 % (ref 0.0–0.2)

## 2023-01-05 LAB — FERRITIN: Ferritin: 73 ng/mL (ref 11–307)

## 2023-01-05 NOTE — Telephone Encounter (Signed)
Brittany Hensley  contacted our office expressing the need to schedule an appointment to have her ferritin and hemoglobin levels checked. She is scheduled for surgery on January 21, 2023, and would like to ensure that her hemoglobin levels are within normal range prior to the procedure.

## 2023-01-06 ENCOUNTER — Telehealth: Payer: Self-pay

## 2023-01-06 ENCOUNTER — Other Ambulatory Visit: Payer: PPO

## 2023-01-06 NOTE — Telephone Encounter (Signed)
Patient gave verbal understanding and had no further questions or concerns  

## 2023-01-06 NOTE — Telephone Encounter (Signed)
-----   Message from Ladene Artist, MD sent at 01/05/2023  4:40 PM EDT ----- Please call patient, hb is stable, iron level is normal- lower over several months, call for symptoms of anemia, f/u as scheduled

## 2023-01-18 ENCOUNTER — Other Ambulatory Visit: Payer: Self-pay | Admitting: Nurse Practitioner

## 2023-01-18 DIAGNOSIS — D500 Iron deficiency anemia secondary to blood loss (chronic): Secondary | ICD-10-CM

## 2023-01-27 DIAGNOSIS — K620 Anal polyp: Secondary | ICD-10-CM | POA: Diagnosis not present

## 2023-01-27 DIAGNOSIS — K641 Second degree hemorrhoids: Secondary | ICD-10-CM | POA: Diagnosis not present

## 2023-02-05 ENCOUNTER — Other Ambulatory Visit: Payer: Self-pay

## 2023-02-05 ENCOUNTER — Telehealth: Payer: Self-pay | Admitting: Nurse Practitioner

## 2023-02-05 ENCOUNTER — Other Ambulatory Visit: Payer: Self-pay | Admitting: Nurse Practitioner

## 2023-02-05 MED ORDER — AZITHROMYCIN 250 MG PO TABS: ORAL_TABLET | ORAL | 0 refills | Status: DC

## 2023-02-05 NOTE — Telephone Encounter (Signed)
This is an antibiotic and if she needs tefill on antibiotic she needs to be seen

## 2023-02-05 NOTE — Telephone Encounter (Signed)
  Prescription Request  02/05/2023  Is this a "Controlled Substance" medicine? no  Have you seen your PCP in the last 2 weeks? no  If YES, route message to pool  -  If NO, patient needs to be scheduled for appointment.  What is the name of the medication or equipment?  Azithromycin TAKE 1 TABLET 1 HOUR PRIOR  TO DENTAL PROCEDURE  Have you contacted your pharmacy to request a refill? yes   Which pharmacy would you like this sent to? [facility]    Patient notified that their request is being sent to the clinical staff for review and that they should receive a response within 2 business days.

## 2023-02-05 NOTE — Telephone Encounter (Signed)
Antibiotic sent to pharmacy. Patient notified and verbalized understanding

## 2023-02-05 NOTE — Telephone Encounter (Signed)
Patient is referring to Azithromycin, another message already sent.

## 2023-02-05 NOTE — Telephone Encounter (Signed)
Last office visit 12/10/22.

## 2023-02-16 ENCOUNTER — Other Ambulatory Visit: Payer: Self-pay | Admitting: Nurse Practitioner

## 2023-03-02 ENCOUNTER — Encounter: Payer: Self-pay | Admitting: Nurse Practitioner

## 2023-03-17 ENCOUNTER — Inpatient Hospital Stay: Payer: PPO | Attending: Oncology

## 2023-03-17 ENCOUNTER — Other Ambulatory Visit: Payer: Self-pay | Admitting: *Deleted

## 2023-03-17 DIAGNOSIS — K922 Gastrointestinal hemorrhage, unspecified: Secondary | ICD-10-CM | POA: Diagnosis not present

## 2023-03-17 DIAGNOSIS — M810 Age-related osteoporosis without current pathological fracture: Secondary | ICD-10-CM

## 2023-03-17 DIAGNOSIS — D500 Iron deficiency anemia secondary to blood loss (chronic): Secondary | ICD-10-CM | POA: Insufficient documentation

## 2023-03-17 DIAGNOSIS — R079 Chest pain, unspecified: Secondary | ICD-10-CM | POA: Diagnosis not present

## 2023-03-17 LAB — CBC WITH DIFFERENTIAL (CANCER CENTER ONLY)
Abs Immature Granulocytes: 0.01 10*3/uL (ref 0.00–0.07)
Basophils Absolute: 0.1 10*3/uL (ref 0.0–0.1)
Basophils Relative: 1 %
Eosinophils Absolute: 0.2 10*3/uL (ref 0.0–0.5)
Eosinophils Relative: 4 %
HCT: 37.8 % (ref 36.0–46.0)
Hemoglobin: 12.3 g/dL (ref 12.0–15.0)
Immature Granulocytes: 0 %
Lymphocytes Relative: 35 %
Lymphs Abs: 1.8 10*3/uL (ref 0.7–4.0)
MCH: 32.5 pg (ref 26.0–34.0)
MCHC: 32.5 g/dL (ref 30.0–36.0)
MCV: 99.7 fL (ref 80.0–100.0)
Monocytes Absolute: 0.5 10*3/uL (ref 0.1–1.0)
Monocytes Relative: 10 %
Neutro Abs: 2.5 10*3/uL (ref 1.7–7.7)
Neutrophils Relative %: 50 %
Platelet Count: 254 10*3/uL (ref 150–400)
RBC: 3.79 MIL/uL — ABNORMAL LOW (ref 3.87–5.11)
RDW: 12.9 % (ref 11.5–15.5)
WBC Count: 5 10*3/uL (ref 4.0–10.5)
nRBC: 0 % (ref 0.0–0.2)

## 2023-03-17 LAB — VITAMIN D 25 HYDROXY (VIT D DEFICIENCY, FRACTURES): Vit D, 25-Hydroxy: 71.7 ng/mL (ref 30–100)

## 2023-03-17 LAB — FERRITIN: Ferritin: 55 ng/mL (ref 11–307)

## 2023-03-17 NOTE — Progress Notes (Signed)
Patient requested Vitamin D level be added to labs today. OK per Dr. Truett Perna with diagnosis of osteoporosis.

## 2023-03-18 ENCOUNTER — Telehealth: Payer: Self-pay

## 2023-03-18 ENCOUNTER — Telehealth: Payer: Self-pay | Admitting: *Deleted

## 2023-03-18 NOTE — Telephone Encounter (Signed)
Patient gave verbal understanding and had no further questions or concerns  

## 2023-03-18 NOTE — Telephone Encounter (Addendum)
Per Dr. Truett Perna: Usually holds off on iron till ferritin is < 50, but since she is symptomatic will give IV iron. Noted last infused with Venofer instead of the iron dextran. Requested she return call to confirm she did well with this product. Sharil called back and requested start date of week of 8/26 to begin the Venofer due to car issues. Scheduling message sent.

## 2023-03-18 NOTE — Telephone Encounter (Signed)
-----   Message from Thornton Papas sent at 03/17/2023  8:30 PM EDT ----- Please call patient, hb and iron levels are normal, f/u as scheduled

## 2023-03-18 NOTE — Telephone Encounter (Signed)
Ms. Pardy called reporting SOB w/exertion and feeling "run down". Is asking for IV iron infusion. Ferritin 55 on 8/14.

## 2023-03-31 ENCOUNTER — Ambulatory Visit: Payer: PPO

## 2023-03-31 ENCOUNTER — Other Ambulatory Visit: Payer: Self-pay | Admitting: Nurse Practitioner

## 2023-04-01 ENCOUNTER — Inpatient Hospital Stay: Payer: PPO

## 2023-04-01 VITALS — BP 120/58 | HR 83 | Temp 98.1°F | Resp 18 | Ht 65.0 in | Wt 105.5 lb

## 2023-04-01 DIAGNOSIS — D500 Iron deficiency anemia secondary to blood loss (chronic): Secondary | ICD-10-CM | POA: Diagnosis not present

## 2023-04-01 MED ORDER — METHYLPREDNISOLONE SODIUM SUCC 125 MG IJ SOLR
125.0000 mg | Freq: Once | INTRAMUSCULAR | Status: DC
  Filled 2023-04-01: qty 2

## 2023-04-01 MED ORDER — SODIUM CHLORIDE 0.9 % IV SOLN: Freq: Once | INTRAVENOUS | Status: AC

## 2023-04-01 MED ORDER — SODIUM CHLORIDE 0.9 % IV SOLN
300.0000 mg | Freq: Once | INTRAVENOUS | Status: AC
  Administered 2023-04-01: 300 mg via INTRAVENOUS
  Filled 2023-04-01: qty 200

## 2023-04-01 MED ORDER — ACETAMINOPHEN 325 MG PO TABS
650.0000 mg | ORAL_TABLET | Freq: Once | ORAL | Status: AC
  Administered 2023-04-01: 650 mg via ORAL
  Filled 2023-04-01: qty 2

## 2023-04-01 MED ORDER — METHYLPREDNISOLONE SODIUM SUCC 125 MG IJ SOLR
125.0000 mg | Freq: Once | INTRAMUSCULAR | Status: AC
  Administered 2023-04-01: 125 mg via INTRAVENOUS

## 2023-04-01 NOTE — Patient Instructions (Signed)
 Iron Sucrose Injection What is this medication? IRON SUCROSE (EYE ern SOO krose) treats low levels of iron (iron deficiency anemia) in people with kidney disease. Iron is a mineral that plays an important role in making red blood cells, which carry oxygen from your lungs to the rest of your body. This medicine may be used for other purposes; ask your health care provider or pharmacist if you have questions. COMMON BRAND NAME(S): Venofer What should I tell my care team before I take this medication? They need to know if you have any of these conditions: Anemia not caused by low iron levels Heart disease High levels of iron in the blood Kidney disease Liver disease An unusual or allergic reaction to iron, other medications, foods, dyes, or preservatives Pregnant or trying to get pregnant Breastfeeding How should I use this medication? This medication is for infusion into a vein. It is given in a hospital or clinic setting. Talk to your care team about the use of this medication in children. While this medication may be prescribed for children as young as 2 years for selected conditions, precautions do apply. Overdosage: If you think you have taken too much of this medicine contact a poison control center or emergency room at once. NOTE: This medicine is only for you. Do not share this medicine with others. What if I miss a dose? Keep appointments for follow-up doses. It is important not to miss your dose. Call your care team if you are unable to keep an appointment. What may interact with this medication? Do not take this medication with any of the following: Deferoxamine Dimercaprol Other iron products This medication may also interact with the following: Chloramphenicol Deferasirox This list may not describe all possible interactions. Give your health care provider a list of all the medicines, herbs, non-prescription drugs, or dietary supplements you use. Also tell them if you smoke,  drink alcohol, or use illegal drugs. Some items may interact with your medicine. What should I watch for while using this medication? Visit your care team regularly. Tell your care team if your symptoms do not start to get better or if they get worse. You may need blood work done while you are taking this medication. You may need to follow a special diet. Talk to your care team. Foods that contain iron include: whole grains/cereals, dried fruits, beans, or peas, leafy green vegetables, and organ meats (liver, kidney). What side effects may I notice from receiving this medication? Side effects that you should report to your care team as soon as possible: Allergic reactions--skin rash, itching, hives, swelling of the face, lips, tongue, or throat Low blood pressure--dizziness, feeling faint or lightheaded, blurry vision Shortness of breath Side effects that usually do not require medical attention (report to your care team if they continue or are bothersome): Flushing Headache Joint pain Muscle pain Nausea Pain, redness, or irritation at injection site This list may not describe all possible side effects. Call your doctor for medical advice about side effects. You may report side effects to FDA at 1-800-FDA-1088. Where should I keep my medication? This medication is given in a hospital or clinic. It will not be stored at home. NOTE: This sheet is a summary. It may not cover all possible information. If you have questions about this medicine, talk to your doctor, pharmacist, or health care provider.  2024 Elsevier/Gold Standard (2022-12-25 00:00:00)

## 2023-04-07 ENCOUNTER — Ambulatory Visit: Payer: PPO

## 2023-04-08 ENCOUNTER — Inpatient Hospital Stay: Payer: PPO | Attending: Oncology

## 2023-04-08 VITALS — BP 149/76 | HR 90 | Temp 98.2°F | Resp 20 | Wt 104.0 lb

## 2023-04-08 DIAGNOSIS — D500 Iron deficiency anemia secondary to blood loss (chronic): Secondary | ICD-10-CM | POA: Diagnosis not present

## 2023-04-08 DIAGNOSIS — K922 Gastrointestinal hemorrhage, unspecified: Secondary | ICD-10-CM | POA: Diagnosis not present

## 2023-04-08 MED ORDER — SODIUM CHLORIDE 0.9 % IV SOLN
300.0000 mg | Freq: Once | INTRAVENOUS | Status: AC
  Administered 2023-04-08: 300 mg via INTRAVENOUS
  Filled 2023-04-08: qty 300

## 2023-04-08 MED ORDER — METHYLPREDNISOLONE SODIUM SUCC 125 MG IJ SOLR
125.0000 mg | Freq: Once | INTRAMUSCULAR | Status: AC
  Administered 2023-04-08: 125 mg via INTRAVENOUS
  Filled 2023-04-08: qty 2

## 2023-04-08 MED ORDER — ACETAMINOPHEN 325 MG PO TABS
650.0000 mg | ORAL_TABLET | Freq: Once | ORAL | Status: AC
  Administered 2023-04-08: 650 mg via ORAL
  Filled 2023-04-08: qty 2

## 2023-04-08 MED ORDER — SODIUM CHLORIDE 0.9 % IV SOLN: Freq: Once | INTRAVENOUS | Status: AC

## 2023-04-08 NOTE — Patient Instructions (Signed)
 Iron Sucrose Injection What is this medication? IRON SUCROSE (EYE ern SOO krose) treats low levels of iron (iron deficiency anemia) in people with kidney disease. Iron is a mineral that plays an important role in making red blood cells, which carry oxygen from your lungs to the rest of your body. This medicine may be used for other purposes; ask your health care provider or pharmacist if you have questions. COMMON BRAND NAME(S): Venofer What should I tell my care team before I take this medication? They need to know if you have any of these conditions: Anemia not caused by low iron levels Heart disease High levels of iron in the blood Kidney disease Liver disease An unusual or allergic reaction to iron, other medications, foods, dyes, or preservatives Pregnant or trying to get pregnant Breastfeeding How should I use this medication? This medication is for infusion into a vein. It is given in a hospital or clinic setting. Talk to your care team about the use of this medication in children. While this medication may be prescribed for children as young as 2 years for selected conditions, precautions do apply. Overdosage: If you think you have taken too much of this medicine contact a poison control center or emergency room at once. NOTE: This medicine is only for you. Do not share this medicine with others. What if I miss a dose? Keep appointments for follow-up doses. It is important not to miss your dose. Call your care team if you are unable to keep an appointment. What may interact with this medication? Do not take this medication with any of the following: Deferoxamine Dimercaprol Other iron products This medication may also interact with the following: Chloramphenicol Deferasirox This list may not describe all possible interactions. Give your health care provider a list of all the medicines, herbs, non-prescription drugs, or dietary supplements you use. Also tell them if you smoke,  drink alcohol, or use illegal drugs. Some items may interact with your medicine. What should I watch for while using this medication? Visit your care team regularly. Tell your care team if your symptoms do not start to get better or if they get worse. You may need blood work done while you are taking this medication. You may need to follow a special diet. Talk to your care team. Foods that contain iron include: whole grains/cereals, dried fruits, beans, or peas, leafy green vegetables, and organ meats (liver, kidney). What side effects may I notice from receiving this medication? Side effects that you should report to your care team as soon as possible: Allergic reactions--skin rash, itching, hives, swelling of the face, lips, tongue, or throat Low blood pressure--dizziness, feeling faint or lightheaded, blurry vision Shortness of breath Side effects that usually do not require medical attention (report to your care team if they continue or are bothersome): Flushing Headache Joint pain Muscle pain Nausea Pain, redness, or irritation at injection site This list may not describe all possible side effects. Call your doctor for medical advice about side effects. You may report side effects to FDA at 1-800-FDA-1088. Where should I keep my medication? This medication is given in a hospital or clinic. It will not be stored at home. NOTE: This sheet is a summary. It may not cover all possible information. If you have questions about this medicine, talk to your doctor, pharmacist, or health care provider.  2024 Elsevier/Gold Standard (2022-12-25 00:00:00)

## 2023-04-14 ENCOUNTER — Ambulatory Visit: Payer: PPO

## 2023-04-15 ENCOUNTER — Inpatient Hospital Stay: Payer: PPO

## 2023-04-15 VITALS — BP 113/58 | HR 81 | Temp 98.1°F | Resp 18 | Wt 102.9 lb

## 2023-04-15 DIAGNOSIS — D500 Iron deficiency anemia secondary to blood loss (chronic): Secondary | ICD-10-CM

## 2023-04-15 MED ORDER — METHYLPREDNISOLONE SODIUM SUCC 125 MG IJ SOLR
125.0000 mg | Freq: Once | INTRAMUSCULAR | Status: AC
  Administered 2023-04-15: 125 mg via INTRAVENOUS
  Filled 2023-04-15: qty 2

## 2023-04-15 MED ORDER — SODIUM CHLORIDE 0.9 % IV SOLN: Freq: Once | INTRAVENOUS | Status: AC

## 2023-04-15 MED ORDER — SODIUM CHLORIDE 0.9 % IV SOLN
300.0000 mg | Freq: Once | INTRAVENOUS | Status: AC
  Administered 2023-04-15: 300 mg via INTRAVENOUS
  Filled 2023-04-15: qty 10

## 2023-04-15 MED ORDER — ACETAMINOPHEN 325 MG PO TABS
650.0000 mg | ORAL_TABLET | Freq: Once | ORAL | Status: AC
  Administered 2023-04-15: 650 mg via ORAL
  Filled 2023-04-15: qty 2

## 2023-04-15 NOTE — Patient Instructions (Signed)
Iron Sucrose Injection What is this medication? IRON SUCROSE (EYE ern SOO krose) treats low levels of iron (iron deficiency anemia) in people with kidney disease. Iron is a mineral that plays an important role in making red blood cells, which carry oxygen from your lungs to the rest of your body. This medicine may be used for other purposes; ask your health care provider or pharmacist if you have questions. COMMON BRAND NAME(S): Venofer What should I tell my care team before I take this medication? They need to know if you have any of these conditions: Anemia not caused by low iron levels Heart disease High levels of iron in the blood Kidney disease Liver disease An unusual or allergic reaction to iron, other medications, foods, dyes, or preservatives Pregnant or trying to get pregnant Breastfeeding How should I use this medication? This medication is for infusion into a vein. It is given in a hospital or clinic setting. Talk to your care team about the use of this medication in children. While this medication may be prescribed for children as young as 2 years for selected conditions, precautions do apply. Overdosage: If you think you have taken too much of this medicine contact a poison control center or emergency room at once. NOTE: This medicine is only for you. Do not share this medicine with others. What if I miss a dose? Keep appointments for follow-up doses. It is important not to miss your dose. Call your care team if you are unable to keep an appointment. What may interact with this medication? Do not take this medication with any of the following: Deferoxamine Dimercaprol Other iron products This medication may also interact with the following: Chloramphenicol Deferasirox This list may not describe all possible interactions. Give your health care provider a list of all the medicines, herbs, non-prescription drugs, or dietary supplements you use. Also tell them if you smoke,  drink alcohol, or use illegal drugs. Some items may interact with your medicine. What should I watch for while using this medication? Visit your care team regularly. Tell your care team if your symptoms do not start to get better or if they get worse. You may need blood work done while you are taking this medication. You may need to follow a special diet. Talk to your care team. Foods that contain iron include: whole grains/cereals, dried fruits, beans, or peas, leafy green vegetables, and organ meats (liver, kidney). What side effects may I notice from receiving this medication? Side effects that you should report to your care team as soon as possible: Allergic reactions--skin rash, itching, hives, swelling of the face, lips, tongue, or throat Low blood pressure--dizziness, feeling faint or lightheaded, blurry vision Shortness of breath Side effects that usually do not require medical attention (report to your care team if they continue or are bothersome): Flushing Headache Joint pain Muscle pain Nausea Pain, redness, or irritation at injection site This list may not describe all possible side effects. Call your doctor for medical advice about side effects. You may report side effects to FDA at 1-800-FDA-1088. Where should I keep my medication? This medication is given in a hospital or clinic. It will not be stored at home. NOTE: This sheet is a summary. It may not cover all possible information. If you have questions about this medicine, talk to your doctor, pharmacist, or health care provider.  2024 Elsevier/Gold Standard (2022-12-25 00:00:00)

## 2023-04-18 ENCOUNTER — Other Ambulatory Visit: Payer: Self-pay | Admitting: Nurse Practitioner

## 2023-04-18 DIAGNOSIS — D500 Iron deficiency anemia secondary to blood loss (chronic): Secondary | ICD-10-CM

## 2023-04-29 ENCOUNTER — Other Ambulatory Visit: Payer: Self-pay | Admitting: Gastroenterology

## 2023-04-29 NOTE — Patient Instructions (Signed)
Our records indicate that you are due for your screening mammogram.  Please call the imaging center that does your yearly mammograms to make an appointment for a mammogram at your earliest convenience. Our office also has a mobile unit through the [facility] that comes to our location. Please call our office if you would like to make an appointment.

## 2023-05-05 ENCOUNTER — Ambulatory Visit (HOSPITAL_COMMUNITY)
Admission: RE | Admit: 2023-05-05 | Discharge: 2023-05-05 | Disposition: A | Discharge status: Home / Self Care | Payer: PPO | Attending: Gastroenterology | Admitting: Gastroenterology

## 2023-05-05 ENCOUNTER — Encounter (HOSPITAL_COMMUNITY)
Admission: RE | Disposition: A | Discharge status: Home / Self Care | Payer: Self-pay | Source: Home / Self Care | Attending: Gastroenterology

## 2023-05-05 ENCOUNTER — Ambulatory Visit (HOSPITAL_BASED_OUTPATIENT_CLINIC_OR_DEPARTMENT_OTHER): Payer: Self-pay | Admitting: Certified Registered Nurse Anesthetist

## 2023-05-05 ENCOUNTER — Ambulatory Visit (HOSPITAL_COMMUNITY): Payer: Self-pay | Admitting: Certified Registered Nurse Anesthetist

## 2023-05-05 ENCOUNTER — Other Ambulatory Visit: Payer: Self-pay

## 2023-05-05 ENCOUNTER — Encounter (HOSPITAL_COMMUNITY): Payer: Self-pay | Admitting: Gastroenterology

## 2023-05-05 DIAGNOSIS — F32A Depression, unspecified: Secondary | ICD-10-CM | POA: Insufficient documentation

## 2023-05-05 DIAGNOSIS — K311 Adult hypertrophic pyloric stenosis: Secondary | ICD-10-CM | POA: Insufficient documentation

## 2023-05-05 DIAGNOSIS — F418 Other specified anxiety disorders: Secondary | ICD-10-CM | POA: Diagnosis not present

## 2023-05-05 DIAGNOSIS — K219 Gastro-esophageal reflux disease without esophagitis: Secondary | ICD-10-CM | POA: Insufficient documentation

## 2023-05-05 DIAGNOSIS — F419 Anxiety disorder, unspecified: Secondary | ICD-10-CM | POA: Insufficient documentation

## 2023-05-05 DIAGNOSIS — R1310 Dysphagia, unspecified: Secondary | ICD-10-CM | POA: Diagnosis not present

## 2023-05-05 DIAGNOSIS — K909 Intestinal malabsorption, unspecified: Secondary | ICD-10-CM | POA: Diagnosis not present

## 2023-05-05 DIAGNOSIS — K9189 Other postprocedural complications and disorders of digestive system: Secondary | ICD-10-CM | POA: Diagnosis not present

## 2023-05-05 DIAGNOSIS — R1311 Dysphagia, oral phase: Secondary | ICD-10-CM | POA: Insufficient documentation

## 2023-05-05 DIAGNOSIS — M1990 Unspecified osteoarthritis, unspecified site: Secondary | ICD-10-CM | POA: Diagnosis not present

## 2023-05-05 DIAGNOSIS — E039 Hypothyroidism, unspecified: Secondary | ICD-10-CM | POA: Insufficient documentation

## 2023-05-05 DIAGNOSIS — Z980 Intestinal bypass and anastomosis status: Secondary | ICD-10-CM | POA: Insufficient documentation

## 2023-05-05 HISTORY — PX: ESOPHAGOGASTRODUODENOSCOPY (EGD) WITH PROPOFOL: SHX5813

## 2023-05-05 HISTORY — PX: BALLOON DILATION: SHX5330

## 2023-05-05 LAB — GLUCOSE, CAPILLARY: Glucose-Capillary: 79 mg/dL (ref 70–99)

## 2023-05-05 SURGERY — ESOPHAGOGASTRODUODENOSCOPY (EGD) WITH PROPOFOL
Anesthesia: Monitor Anesthesia Care

## 2023-05-05 MED ORDER — SODIUM CHLORIDE 0.9 % IV SOLN: INTRAVENOUS | Status: DC

## 2023-05-05 MED ORDER — PROPOFOL 1000 MG/100ML IV EMUL
INTRAVENOUS | Status: AC
  Filled 2023-05-05: qty 100

## 2023-05-05 MED ORDER — LIDOCAINE 2% (20 MG/ML) 5 ML SYRINGE
INTRAMUSCULAR | Status: DC | PRN
  Administered 2023-05-05: 80 mg via INTRAVENOUS

## 2023-05-05 MED ORDER — PHENYLEPHRINE 80 MCG/ML (10ML) SYRINGE FOR IV PUSH (FOR BLOOD PRESSURE SUPPORT)
PREFILLED_SYRINGE | INTRAVENOUS | Status: DC | PRN
  Administered 2023-05-05: 40 ug via INTRAVENOUS
  Administered 2023-05-05: 80 ug via INTRAVENOUS
  Administered 2023-05-05: 40 ug via INTRAVENOUS

## 2023-05-05 MED ORDER — PROPOFOL 500 MG/50ML IV EMUL
INTRAVENOUS | Status: DC | PRN
  Administered 2023-05-05: 100 ug/kg/min via INTRAVENOUS

## 2023-05-05 MED ORDER — ONDANSETRON HCL 4 MG/2ML IJ SOLN
INTRAMUSCULAR | Status: DC | PRN
Start: 2023-05-05 — End: 2023-05-05
  Administered 2023-05-05: 4 mg via INTRAVENOUS

## 2023-05-05 MED ORDER — PROPOFOL 10 MG/ML IV BOLUS
INTRAVENOUS | Status: DC | PRN
Start: 2023-05-05 — End: 2023-05-05
  Administered 2023-05-05: 50 mg via INTRAVENOUS
  Administered 2023-05-05 (×2): 20 mg via INTRAVENOUS

## 2023-05-05 MED ORDER — LACTATED RINGERS IV SOLN
INTRAVENOUS | Status: AC | PRN
Start: 2023-05-05 — End: 2023-05-05
  Administered 2023-05-05: 1000 mL via INTRAVENOUS

## 2023-05-05 SURGICAL SUPPLY — 15 items

## 2023-05-05 NOTE — Progress Notes (Signed)
Brittany Hensley 10:25 AM  Subjective: Patient with recurrent nausea vomiting from presumed gastric outlet obstruction no other new complaints and no other new medical problems  Objective: Vital signs stable afebrile no acute distress exam please see preassessment evaluation recent labs reviewed  Assessment: Anastomotic stricture  Plan: Okay to proceed with repeat endoscopy and probable balloon dilation with anesthesia assistance  [facility]  office [phone] After 5PM or if no answer call [phone]

## 2023-05-05 NOTE — Anesthesia Procedure Notes (Signed)
Procedure Name: MAC Date/Time: 05/05/2023 11:30 AM  Performed by: Ludwig Lean, CRNAPre-anesthesia Checklist: Patient identified, Emergency Drugs available, Suction available and Patient being monitored Patient Re-evaluated:Patient Re-evaluated prior to induction Oxygen Delivery Method: Simple face mask Placement Confirmation: positive ETCO2 and breath sounds checked- equal and bilateral Dental Injury: Teeth and Oropharynx as per pre-operative assessment

## 2023-05-05 NOTE — Op Note (Signed)
[facility] Patient Name: Brittany Hensley Procedure Date: 05/05/2023 MRN: [id number] Attending MD: Vida Rigger , MD, [id number] Date of Birth: 1951-06-20 CSN: [id number] Age: 72 Admit Type: Outpatient Procedure:                Upper GI endoscopy Indications:              Management of operative complication: Dilation of                            anastomotic stricture symptomatic gastric outlet                            obstruction Providers:                Vida Rigger, MD, Stephens Shire RN, RN, Suzy Bouchard,                            RN, Kandice Robinsons, Technician, Salley Scarlet,                            Technician Referring MD:              Medicines:                Monitored Anesthesia Care Complications:            No immediate complications. Estimated Blood Loss:     Estimated blood loss was minimal. Procedure:                Pre-Anesthesia Assessment:                           - Prior to the procedure, a History and Physical                            was performed, and patient medications and                            allergies were reviewed. The patient's tolerance of                            previous anesthesia was also reviewed. The risks                            and benefits of the procedure and the sedation                            options and risks were discussed with the patient.                            All questions were answered, and informed consent                            was obtained. Prior Anticoagulants: The patient has                            taken no anticoagulant or  antiplatelet agents. ASA                            Grade Assessment: II - A patient with mild systemic                            disease. After reviewing the risks and benefits,                            the patient was deemed in satisfactory condition to                            undergo the procedure.                           After obtaining informed consent,  the endoscope was                            passed under direct vision. Throughout the                            procedure, the patient's blood pressure, pulse, and                            oxygen saturations were monitored continuously. The                            GIF-H190 ([id number]) Olympus endoscope was introduced                            through the mouth, and advanced to the jejunum. The                            upper GI endoscopy was accomplished without                            difficulty. The patient tolerated the procedure                            well. Scope In: Scope Out: Findings:      The larynx was normal.      The examined esophagus was normal.      Evidence of a patent Billroth I gastroduodenostomy was found. A gastric       pouch was found. The gastroduodenal anastomosis as well as the entire       stomach was characterized by erythema. This was traversed. A TTS dilator       was passed through the scope. Dilation with an 18-19-20 mm balloon       dilator was performed to 20 mm. That was the only balloon we used and       the dilation site was examined and showed mild mucosal disruption and       mild improvement in luminal narrowing.      The examined jejunum was normal.      The cardia and gastric fundus were normal on retroflexion. Impression:               -  Normal larynx.                           - Normal esophagus.                           - Patent Billroth I gastroduodenostomy was found,                            characterized by erythema. Dilated.                           - Normal examined jejunum.                           - No specimens collected. Moderate Sedation:      Not Applicable - Patient had care per Anesthesia. Recommendation:           - Clear liquid diet for 2 hours. If doing well may                            have soft solids the rest of today and slowly                            advance tomorrow                           -  Continue present medications.                           - Return to GI clinic in 6 months or as needed.                           - Telephone GI clinic if symptomatic PRN. Procedure Code(s):        --- Professional ---                           [phone], Esophagogastroduodenoscopy, flexible,                            transoral; with dilation of gastric/duodenal                            stricture(s) (eg, balloon, bougie) Diagnosis Code(s):        --- Professional ---                           Z98.0, Intestinal bypass and anastomosis status                           K91.89, Other postprocedural complications and                            disorders of digestive system CPT copyright 2022 [employer]. All rights reserved. The codes documented in this report are preliminary and upon coder review may  be revised to meet current compliance requirements. Vida Rigger,  MD 05/05/2023 12:01:04 PM This report has been signed electronically. Number of Addenda: 0

## 2023-05-05 NOTE — Anesthesia Preprocedure Evaluation (Addendum)
Anesthesia Evaluation  Patient identified by MRN, date of birth, ID band Patient awake    Reviewed: Allergy & Precautions, Patient's Chart, lab work & pertinent test results  Airway Mallampati: II  TM Distance: >3 FB Neck ROM: Full    Dental no notable dental hx.    Pulmonary neg pulmonary ROS   Pulmonary exam normal        Cardiovascular negative cardio ROS Normal cardiovascular exam     Neuro/Psych  PSYCHIATRIC DISORDERS Anxiety Depression    negative neurological ROS     GI/Hepatic Neg liver ROS,GERD  Medicated and Controlled,,malabsorption   Endo/Other  Hypothyroidism    Renal/GU negative Renal ROS     Musculoskeletal  (+) Arthritis ,    Abdominal   Peds  Hematology negative hematology ROS (+)   Anesthesia Other Findings Dysphagia Gastric outlet obstruction  Reproductive/Obstetrics                             Anesthesia Physical Anesthesia Plan  ASA: 3  Anesthesia Plan: MAC   Post-op Pain Management:    Induction:   PONV Risk Score and Plan: 2 and Propofol infusion and Treatment may vary due to age or medical condition  Airway Management Planned: Nasal Cannula  Additional Equipment:   Intra-op Plan:   Post-operative Plan:   Informed Consent: I have reviewed the patients History and Physical, chart, labs and discussed the procedure including the risks, benefits and alternatives for the proposed anesthesia with the patient or authorized representative who has indicated his/her understanding and acceptance.     Dental advisory given  Plan Discussed with: CRNA  Anesthesia Plan Comments:        Anesthesia Quick Evaluation

## 2023-05-05 NOTE — Transfer of Care (Signed)
Immediate Anesthesia Transfer of Care Note  Patient: Brittany Hensley  Procedure(s) Performed: Procedure(s) with comments: ESOPHAGOGASTRODUODENOSCOPY (EGD) WITH PROPOFOL (N/A) - with balloon dilation BALLOON DILATION (N/A)  Patient Location: PACU  Anesthesia Type:MAC  Level of Consciousness: Patient easily awoken, sedated, comfortable, cooperative, following commands, responds to stimulation.   Airway & Oxygen Therapy: Patient spontaneously breathing, ventilating well, oxygen via simple oxygen mask.  Post-op Assessment: Report given to PACU RN, vital signs reviewed and stable, moving all extremities.   Post vital signs: Reviewed and stable.  Complications: No apparent anesthesia complications Last Vitals:  Vitals Value Taken Time  BP 115/50 1151 05/05/23  Temp    Pulse 87 05/05/23 1154  Resp 22 05/05/23 1154  SpO2 100 % 05/05/23 1154  Vitals shown include unfiled device data.  Last Pain:  Vitals:   05/05/23 0956  TempSrc: Tympanic  PainSc: 0-No pain         Complications: No notable events documented.

## 2023-05-05 NOTE — Discharge Instructions (Addendum)
Clear liquids for 2 hours and if doing well then have soft solids today slowly advance diet tomorrow and call if GI question or problem or if symptoms get worse otherwise follow-up in 6 monthsYOU HAD AN ENDOSCOPIC PROCEDURE TODAY: Refer to the procedure report and other information in the discharge instructions given to you for any specific questions about what was found during the examination. If this information does not answer your questions, please call [facility] office at [phone] to clarify.   YOU SHOULD EXPECT: Some feelings of bloating in the abdomen. Passage of more gas than usual. Walking can help get rid of the air that was put into your GI tract during the procedure and reduce the bloating. If you had a lower endoscopy (such as a colonoscopy or flexible sigmoidoscopy) you may notice spotting of blood in your stool or on the toilet paper. Some abdominal soreness may be present for a day or two, also.  DIET: Your first meal following the procedure should be a light meal and then it is ok to progress to your normal diet. A half-sandwich or bowl of soup is an example of a good first meal. Heavy or fried foods are harder to digest and may make you feel nauseous or bloated. Drink plenty of fluids but you should avoid alcoholic beverages for 24 hours. If you had a esophageal dilation, please see attached instructions for diet.    ACTIVITY: Your care partner should take you home directly after the procedure. You should plan to take it easy, moving slowly for the rest of the day. You can resume normal activity the day after the procedure however YOU SHOULD NOT DRIVE, use power tools, machinery or perform tasks that involve climbing or major physical exertion for 24 hours (because of the sedation medicines used during the test).   SYMPTOMS TO REPORT IMMEDIATELY: A gastroenterologist can be reached at any hour. Please call [phone]  for any of the following symptoms:  Following lower endoscopy  (colonoscopy, flexible sigmoidoscopy) Excessive amounts of blood in the stool  Significant tenderness, worsening of abdominal pains  Swelling of the abdomen that is new, acute  Fever of 100 or higher  Following upper endoscopy (EGD, EUS, ERCP, esophageal dilation) Vomiting of blood or coffee ground material  New, significant abdominal pain  New, significant chest pain or pain under the shoulder blades  Painful or persistently difficult swallowing  New shortness of breath  Black, tarry-looking or red, bloody stools  FOLLOW UP:  If any biopsies were taken you will be contacted by phone or by letter within the next 1-3 weeks. Call [phone]  if you have not heard about the biopsies in 3 weeks.  Please also call with any specific questions about appointments or follow up tests. YOU HAD AN ENDOSCOPIC PROCEDURE TODAY: Refer to the procedure report and other information in the discharge instructions given to you for any specific questions about what was found during the examination. If this information does not answer your questions, please call [facility] office at [phone] to clarify. YOU HAD AN ENDOSCOPIC PROCEDURE TODAY: Refer to the procedure report and other information in the discharge instructions given to you for any specific questions about what was found during the examination. If this information does not answer your questions, please call [facility] office at [phone] to clarify.   YOU SHOULD EXPECT: Some feelings of bloating in the abdomen. Passage of more gas than usual. Walking can help get rid of the air that was  put into your GI tract during the procedure and reduce the bloating. If you had a lower endoscopy (such as a colonoscopy or flexible sigmoidoscopy) you may notice spotting of blood in your stool or on the toilet paper. Some abdominal soreness may be present for a day or two, also.  DIET: Your first meal following the procedure should be a light meal and then it is ok to  progress to your normal diet. A half-sandwich or bowl of soup is an example of a good first meal. Heavy or fried foods are harder to digest and may make you feel nauseous or bloated. Drink plenty of fluids but you should avoid alcoholic beverages for 24 hours. If you had a esophageal dilation, please see attached instructions for diet.    ACTIVITY: Your care partner should take you home directly after the procedure. You should plan to take it easy, moving slowly for the rest of the day. You can resume normal activity the day after the procedure however YOU SHOULD NOT DRIVE, use power tools, machinery or perform tasks that involve climbing or major physical exertion for 24 hours (because of the sedation medicines used during the test).   SYMPTOMS TO REPORT IMMEDIATELY: A gastroenterologist can be reached at any hour. Please call [phone]  for any of the following symptoms:  Following lower endoscopy (colonoscopy, flexible sigmoidoscopy) Excessive amounts of blood in the stool  Significant tenderness, worsening of abdominal pains  Swelling of the abdomen that is new, acute  Fever of 100 or higher  Following upper endoscopy (EGD, EUS, ERCP, esophageal dilation) Vomiting of blood or coffee ground material  New, significant abdominal pain  New, significant chest pain or pain under the shoulder blades  Painful or persistently difficult swallowing  New shortness of breath  Black, tarry-looking or red, bloody stools  FOLLOW UP:  If any biopsies were taken you will be contacted by phone or by letter within the next 1-3 weeks. Call [phone]  if you have not heard about the biopsies in 3 weeks.  Please also call with any specific questions about appointments or follow up tests. YOU HAD AN ENDOSCOPIC PROCEDURE TODAY: Refer to the procedure report and other information in the discharge instructions given to you for any specific questions about what was found during the examination. If this  information does not answer your questions, please call [facility] office at [phone] to clarify.

## 2023-05-06 ENCOUNTER — Telehealth: Payer: Self-pay | Admitting: Urology

## 2023-05-06 NOTE — Anesthesia Postprocedure Evaluation (Signed)
Anesthesia Post Note  Patient: Valine Drozdowski Weckwerth  Procedure(s) Performed: ESOPHAGOGASTRODUODENOSCOPY (EGD) WITH PROPOFOL BALLOON DILATION     Patient location during evaluation: Endoscopy Anesthesia Type: MAC Level of consciousness: awake Pain management: pain level controlled Vital Signs Assessment: post-procedure vital signs reviewed and stable Respiratory status: spontaneous breathing, nonlabored ventilation and respiratory function stable Cardiovascular status: blood pressure returned to baseline and stable Postop Assessment: no apparent nausea or vomiting Anesthetic complications: no   No notable events documented.  Last Vitals:  Vitals:   05/05/23 1210 05/05/23 1214  BP: (!) 110/59 101/66  Pulse: 77 79  Resp: 17 15  Temp:    SpO2: 100% 100%    Last Pain:  Vitals:   05/05/23 1210  TempSrc:   PainSc: 0-No pain                 Jerris Keltz P Cynitha Berte

## 2023-05-06 NOTE — Telephone Encounter (Signed)
Tried calling patient with no answer, left vm for return call 

## 2023-05-06 NOTE — Telephone Encounter (Signed)
Patient called said she was to have to have CT done before her 10/24 appt with Annabell Howells but there is no order for her to have CT leave message on her voicemail

## 2023-05-10 ENCOUNTER — Ambulatory Visit (INDEPENDENT_AMBULATORY_CARE_PROVIDER_SITE_OTHER): Payer: PPO | Admitting: Nurse Practitioner

## 2023-05-10 ENCOUNTER — Encounter (HOSPITAL_COMMUNITY): Payer: Self-pay | Admitting: Gastroenterology

## 2023-05-10 VITALS — BP 103/59 | HR 77 | Temp 97.1°F | Resp 20 | Ht 65.0 in | Wt 100.0 lb

## 2023-05-10 DIAGNOSIS — Z681 Body mass index (BMI) 19 or less, adult: Secondary | ICD-10-CM

## 2023-05-10 DIAGNOSIS — M810 Age-related osteoporosis without current pathological fracture: Secondary | ICD-10-CM

## 2023-05-10 DIAGNOSIS — L719 Rosacea, unspecified: Secondary | ICD-10-CM | POA: Diagnosis not present

## 2023-05-10 DIAGNOSIS — D500 Iron deficiency anemia secondary to blood loss (chronic): Secondary | ICD-10-CM | POA: Diagnosis not present

## 2023-05-10 DIAGNOSIS — E034 Atrophy of thyroid (acquired): Secondary | ICD-10-CM | POA: Diagnosis not present

## 2023-05-10 DIAGNOSIS — F411 Generalized anxiety disorder: Secondary | ICD-10-CM

## 2023-05-10 DIAGNOSIS — Z903 Acquired absence of stomach [part of]: Secondary | ICD-10-CM | POA: Diagnosis not present

## 2023-05-10 DIAGNOSIS — K912 Postsurgical malabsorption, not elsewhere classified: Secondary | ICD-10-CM | POA: Diagnosis not present

## 2023-05-10 DIAGNOSIS — K219 Gastro-esophageal reflux disease without esophagitis: Secondary | ICD-10-CM | POA: Diagnosis not present

## 2023-05-10 MED ORDER — FOLIC ACID 800 MCG PO TABS: 800.0000 ug | ORAL_TABLET | Freq: Every day | ORAL | 1 refills | Status: DC

## 2023-05-10 MED ORDER — ALPRAZOLAM 0.5 MG PO TABS: 0.5000 mg | ORAL_TABLET | Freq: Four times a day (QID) | ORAL | 5 refills | Status: DC | PRN

## 2023-05-10 MED ORDER — SUCRALFATE 1 G PO TABS
1.0000 g | ORAL_TABLET | Freq: Three times a day (TID) | ORAL | 1 refills | Status: DC
Start: 2023-05-10 — End: 2023-07-19

## 2023-05-10 MED ORDER — OMEPRAZOLE 40 MG PO CPDR: 40.0000 mg | DELAYED_RELEASE_CAPSULE | Freq: Every day | ORAL | 1 refills | Status: DC

## 2023-05-10 MED ORDER — LEVOTHYROXINE SODIUM 100 MCG PO TABS
100.0000 ug | ORAL_TABLET | Freq: Every day | ORAL | 1 refills | Status: DC
Start: 2023-05-10 — End: 2023-05-11

## 2023-05-10 MED ORDER — SERTRALINE HCL 100 MG PO TABS
ORAL_TABLET | ORAL | 1 refills | Status: DC
Start: 2023-05-10 — End: 2023-11-05

## 2023-05-10 MED ORDER — ONDANSETRON 4 MG PO TBDP: 4.0000 mg | ORAL_TABLET | Freq: Two times a day (BID) | ORAL | 1 refills | Status: DC

## 2023-05-10 NOTE — Progress Notes (Signed)
Subjective:    Patient ID: Brittany Hensley, female    DOB: [birth year], 72 y.o.   MRN: [id number]   Chief Complaint: medical management of chronic issues     HPI:  Brittany Hensley is a 72 y.o. who identifies as a female who was assigned female at birth.   Social history: Lives with: her daughter lives with her Work history: disability   Comes in today for follow up of the following chronic medical issues:  1. Gastroesophageal reflux disease without esophagitis Is on omeprazole and is doing well  2. Postgastrectomy malabsorption Patient is very thin. Says she eats well.  3. Hypothyroidism due to acquired atrophy of thyroid No issues that she is aware of Lab Results  Component Value Date   TSH 0.866 02/17/2022     4. GAD (generalized anxiety disorder) Is on xanax and zoloft. Combination is working well for her.    05/10/2023   11:31 AM 12/10/2022   10:42 AM 11/03/2022   11:35 AM 08/27/2022   11:05 AM  GAD 7 : Generalized Anxiety Score  Nervous, Anxious, on Edge 0 0 0 0  Control/stop worrying 0 0 0 0  Worry too much - different things 0 0 0 0  Trouble relaxing 0 0 0 0  Restless 0 0 0 0  Easily annoyed or irritable 0 0 0 0  Afraid - awful might happen 0 0 0 0  Total GAD 7 Score 0 0 0 0  Anxiety Difficulty Not difficult at all Not difficult at all Not difficult at all Not difficult at all      5. Iron deficiency anemia due to chronic blood loss Stays very pale. Is on iron supplements. She had iron transfusion a few weeks ago Lab Results  Component Value Date   HGB 12.3 03/17/2023     6. Rosacea No recent flare ups. Is on no meds for this  7. Osteoporosis without pathological fracture Last bone density test was done on 05/26/22. T score was -5.3. is on no meds  8. BMI less than 19,adult Weight is down 6lbs. Patient could not eat for 3 days because she was having esophagus stretched. Wt Readings from Last 3 Encounters:  05/10/23 100 lb (45.4 kg)  05/05/23  106 lb (48.1 kg)  04/15/23 102 lb 14.4 oz (46.7 kg)   BMI Readings from Last 3 Encounters:  05/10/23 16.64 kg/m  05/05/23 17.64 kg/m  04/15/23 17.12 kg/m      New complaints: None today  Allergies  Allergen Reactions   Augmentin [Amoxicillin-Pot Clavulanate] Other (See Comments)    Bloody stool Did it involve swelling of the face/tongue/throat, SOB, or low BP? No Did it involve sudden or severe rash/hives, skin peeling, or any reaction on the inside of your mouth or nose? No Did you need to seek medical attention at a hospital or doctor's office? No When did it last happen?      1 year If all above answers are "NO", may proceed with cephalosporin use.    Famotidine Other (See Comments)    Fever    Klonopin [Clonazepam] Other (See Comments)    double vision   Other Other (See Comments)   Nitrofurantoin Rash   Reglan [Metoclopramide] Anxiety and Other (See Comments)    Causes confusion   Sulfamethoxazole-Trimethoprim Rash   Outpatient Encounter Medications as of 05/10/2023  Medication Sig   acetaminophen (TYLENOL) 500 MG tablet Take 500 mg by mouth 2 (two) times daily.   albuterol (  VENTOLIN HFA) 108 (90 Base) MCG/ACT inhaler USE 2 PUFFS EVERY 6 HOURS AS NEEDED   ALPRAZolam (XANAX) 0.5 MG tablet Take 1 tablet (0.5 mg total) by mouth 4 (four) times daily as needed for anxiety.   Ascorbic Acid (VITAMIN C) 1000 MG tablet Take 1,000 mg by mouth daily.   azithromycin (ZITHROMAX Z-PAK) 250 MG tablet 1 po 1 hour prior to dental procedure   Biotin 10 MG CAPS Take 10 mg by mouth daily.   BLACK ELDERBERRY PO Take 50 mg by mouth daily.   calcium citrate (CALCITRATE - DOSED IN MG ELEMENTAL CALCIUM) 950 (200 Ca) MG tablet Take 2 tablets by mouth 2 (two) times daily.   Cholecalciferol (VITAMIN D) 2000 units tablet Take 2,000 Units by mouth 2 (two) times daily.   diclofenac Sodium (VOLTAREN) 1 % GEL Apply 4 g topically daily as needed (Hip pain).   docusate sodium (COLACE) 100 MG  capsule Take 1 capsule (100 mg total) by mouth 2 (two) times daily.   folic acid (FOLVITE) 800 MCG tablet Take 1 tablet (800 mcg total) by mouth daily.   levothyroxine (SYNTHROID) 100 MCG tablet Take 1 tablet (100 mcg total) by mouth daily.   loratadine (CLARITIN) 10 MG tablet TAKE ONE (1) TABLET EACH DAY   MAGNESIUM CITRATE PO Take 500 mg by mouth 2 (two) times daily.   meclizine (ANTIVERT) 12.5 MG tablet Take 12.5 mg by mouth 3 (three) times daily as needed for dizziness.   Multiple Vitamins-Minerals (CENTRUM SILVER) CHEW Chew 1 tablet by mouth 2 (two) times daily.   nystatin (MYCOSTATIN) 100000 UNIT/ML suspension Take 5 mLs (500,000 Units total) by mouth 4 (four) times daily.   nystatin-triamcinolone (MYCOLOG II) cream Apply 1 Application topically 2 (two) times daily.   omeprazole (PRILOSEC) 40 MG capsule Take 1 capsule (40 mg total) by mouth daily.   ondansetron (ZOFRAN-ODT) 4 MG disintegrating tablet TAKE 1 TABLET EVERY 8 HOURS AS NEEDED FOR NAUSEA AND VOMITING   Polyethyl Glycol-Propyl Glycol (SYSTANE OP) Place 1 drop into both eyes daily as needed (dry eyes).   Probiotic Product (PROBIOTIC PO) Take 1 capsule by mouth daily.    sertraline (ZOLOFT) 100 MG tablet TAKE TWO (2) TABLETS BY MOUTH DAILY   sucralfate (CARAFATE) 1 g tablet TAKE 1 TABLET 4 TIMES DAILY WITH MEALS AND AT BEDTIME   Turmeric Curcumin 500 MG CAPS Take 500 mg by mouth daily.   No facility-administered encounter medications on file as of 05/10/2023.    Past Surgical History:  Procedure Laterality Date   APPENDECTOMY     BALLOON DILATION  06/16/2011   Procedure: BALLOON DILATION;  Surgeon: Petra Kuba, MD;  Location: [facility] ENDOSCOPY;  Service: Endoscopy;  Laterality: N/A;   BALLOON DILATION N/A 10/03/2013   Procedure: BALLOON DILATION;  Surgeon: Petra Kuba, MD;  Location: [facility] ENDOSCOPY;  Service: Endoscopy;  Laterality: N/A;   BALLOON DILATION N/A 08/10/2014   Procedure: BALLOON DILATION;  Surgeon: Petra Kuba, MD;   Location: [facility] ENDOSCOPY;  Service: Endoscopy;  Laterality: N/A;  from 12 - 15 cm dilation completed   BALLOON DILATION N/A 02/15/2015   Procedure: BALLOON DILATION;  Surgeon: Vida Rigger, MD;  Location: [facility] ENDOSCOPY;  Service: Endoscopy;  Laterality: N/A;   BALLOON DILATION N/A 11/12/2015   Procedure: BALLOON DILATION;  Surgeon: Vida Rigger, MD;  Location: [facility] ENDOSCOPY;  Service: Endoscopy;  Laterality: N/A;   BALLOON DILATION N/A 08/10/2016   Procedure: BALLOON DILATION;  Surgeon: Vida Rigger, MD;  Location: [facility]  ENDOSCOPY;  Service: Endoscopy;  Laterality: N/A;   BALLOON DILATION N/A 08/07/2016   Procedure: BALLOON DILATION;  Surgeon: Vida Rigger, MD;  Location: [facility] ENDOSCOPY;  Service: Endoscopy;  Laterality: N/A;   BALLOON DILATION N/A 05/20/2017   Procedure: BALLOON DILATION;  Surgeon: Vida Rigger, MD;  Location: [facility] ENDOSCOPY;  Service: Endoscopy;  Laterality: N/A;   BALLOON DILATION N/A 12/02/2017   Procedure: BALLOON DILATION;  Surgeon: Vida Rigger, MD;  Location: [facility] ENDOSCOPY;  Service: Endoscopy;  Laterality: N/A;   BALLOON DILATION N/A 07/19/2018   Procedure: BALLOON DILATION;  Surgeon: Vida Rigger, MD;  Location: [facility] ENDOSCOPY;  Service: Endoscopy;  Laterality: N/A;   BALLOON DILATION N/A 01/23/2019   Procedure: BALLOON DILATION;  Surgeon: Vida Rigger, MD;  Location: [facility] ENDOSCOPY;  Service: Endoscopy;  Laterality: N/A;   BALLOON DILATION N/A 07/13/2019   Procedure: BALLOON DILATION;  Surgeon: Vida Rigger, MD;  Location: [facility] ENDOSCOPY;  Service: Endoscopy;  Laterality: N/A;   BALLOON DILATION N/A 02/02/2020   Procedure: BALLOON DILATION;  Surgeon: Vida Rigger, MD;  Location: [facility] ENDOSCOPY;  Service: Endoscopy;  Laterality: N/A;   BALLOON DILATION N/A 07/16/2020   Procedure: BALLOON DILATION;  Surgeon: Vida Rigger, MD;  Location: [facility] ENDOSCOPY;  Service: Endoscopy;  Laterality: N/A;   BALLOON DILATION N/A 02/19/2021   Procedure: BALLOON DILATION;  Surgeon: Vida Rigger, MD;  Location: [facility]  ENDOSCOPY;  Service: Endoscopy;  Laterality: N/A;   BALLOON DILATION N/A 09/30/2021   Procedure: BALLOON DILATION;  Surgeon: Vida Rigger, MD;  Location: [facility] ENDOSCOPY;  Service: Endoscopy;  Laterality: N/A;   BALLOON DILATION N/A 03/19/2022   Procedure: BALLOON DILATION;  Surgeon: Vida Rigger, MD;  Location: [facility] ENDOSCOPY;  Service: Gastroenterology;  Laterality: N/A;   BALLOON DILATION N/A 05/05/2023   Procedure: BALLOON DILATION;  Surgeon: Vida Rigger, MD;  Location: [facility] ENDOSCOPY;  Service: Gastroenterology;  Laterality: N/A;   CATARACT EXTRACTION, BILATERAL     2019   CHOLECYSTECTOMY OPEN  1979   COLONOSCOPY     Hx: of   CYSTOSCOPY/URETEROSCOPY/HOLMIUM LASER/STENT PLACEMENT Bilateral 05/06/2021   Procedure: CYSTOSCOPY BILATERAL RETROGRADE LEFT URETEROSCOPY/HOLMIUM LASER/STENT PLACEMENT;  Surgeon: Bjorn Pippin, MD;  Location: [facility];  Service: Urology;  Laterality: Bilateral;   ESOPHAGOGASTRODUODENOSCOPY  06/16/2011   Procedure: ESOPHAGOGASTRODUODENOSCOPY (EGD);  Surgeon: Petra Kuba, MD;  Location: [facility] ENDOSCOPY;  Service: Endoscopy;  Laterality: N/A;   ESOPHAGOGASTRODUODENOSCOPY N/A 10/03/2013   Procedure: ESOPHAGOGASTRODUODENOSCOPY (EGD);  Surgeon: Petra Kuba, MD;  Location: [facility] ENDOSCOPY;  Service: Endoscopy;  Laterality: N/A;   ESOPHAGOGASTRODUODENOSCOPY N/A 08/10/2014   Procedure: ESOPHAGOGASTRODUODENOSCOPY (EGD);  Surgeon: Petra Kuba, MD;  Location: [facility] ENDOSCOPY;  Service: Endoscopy;  Laterality: N/A;   ESOPHAGOGASTRODUODENOSCOPY N/A 08/10/2016   Procedure: ESOPHAGOGASTRODUODENOSCOPY (EGD);  Surgeon: Vida Rigger, MD;  Location: [facility] ENDOSCOPY;  Service: Endoscopy;  Laterality: N/A;   ESOPHAGOGASTRODUODENOSCOPY N/A 07/19/2018   Procedure: ESOPHAGOGASTRODUODENOSCOPY (EGD);  Surgeon: Vida Rigger, MD;  Location: [facility] ENDOSCOPY;  Service: Endoscopy;  Laterality: N/A;   ESOPHAGOGASTRODUODENOSCOPY (EGD) WITH PROPOFOL N/A 02/15/2015   Procedure: ESOPHAGOGASTRODUODENOSCOPY (EGD)  WITH PROPOFOL;  Surgeon: Vida Rigger, MD;  Location: [facility] ENDOSCOPY;  Service: Endoscopy;  Laterality: N/A;   ESOPHAGOGASTRODUODENOSCOPY (EGD) WITH PROPOFOL N/A 11/12/2015   Procedure: ESOPHAGOGASTRODUODENOSCOPY (EGD) WITH PROPOFOL;  Surgeon: Vida Rigger, MD;  Location: [facility] ENDOSCOPY;  Service: Endoscopy;  Laterality: N/A;   ESOPHAGOGASTRODUODENOSCOPY (EGD) WITH PROPOFOL N/A 08/07/2016   Procedure: ESOPHAGOGASTRODUODENOSCOPY (EGD) WITH PROPOFOL;  Surgeon: Vida Rigger, MD;  Location: [facility] ENDOSCOPY;  Service: Endoscopy;  Laterality: N/A;  ESOPHAGOGASTRODUODENOSCOPY (EGD) WITH PROPOFOL N/A 05/20/2017   Procedure: ESOPHAGOGASTRODUODENOSCOPY (EGD) WITH PROPOFOL;  Surgeon: Vida Rigger, MD;  Location: [facility] ENDOSCOPY;  Service: Endoscopy;  Laterality: N/A;   ESOPHAGOGASTRODUODENOSCOPY (EGD) WITH PROPOFOL N/A 12/02/2017   Procedure: ESOPHAGOGASTRODUODENOSCOPY (EGD) WITH PROPOFOL;  Surgeon: Vida Rigger, MD;  Location: [facility] ENDOSCOPY;  Service: Endoscopy;  Laterality: N/A;  have c arm available   ESOPHAGOGASTRODUODENOSCOPY (EGD) WITH PROPOFOL N/A 01/23/2019   Procedure: ESOPHAGOGASTRODUODENOSCOPY (EGD) WITH PROPOFOL;  Surgeon: Vida Rigger, MD;  Location: [facility] ENDOSCOPY;  Service: Endoscopy;  Laterality: N/A;   ESOPHAGOGASTRODUODENOSCOPY (EGD) WITH PROPOFOL N/A 07/13/2019   Procedure: ESOPHAGOGASTRODUODENOSCOPY (EGD) WITH PROPOFOL;  Surgeon: Vida Rigger, MD;  Location: [facility] ENDOSCOPY;  Service: Endoscopy;  Laterality: N/A;   ESOPHAGOGASTRODUODENOSCOPY (EGD) WITH PROPOFOL N/A 02/02/2020   Procedure: ESOPHAGOGASTRODUODENOSCOPY (EGD) WITH PROPOFOL;  Surgeon: Vida Rigger, MD;  Location: [facility] ENDOSCOPY;  Service: Endoscopy;  Laterality: N/A;   ESOPHAGOGASTRODUODENOSCOPY (EGD) WITH PROPOFOL N/A 07/16/2020   Procedure: ESOPHAGOGASTRODUODENOSCOPY (EGD) WITH PROPOFOL;  Surgeon: Vida Rigger, MD;  Location: [facility] ENDOSCOPY;  Service: Endoscopy;  Laterality: N/A;   ESOPHAGOGASTRODUODENOSCOPY (EGD) WITH PROPOFOL N/A 02/19/2021   Procedure:  ESOPHAGOGASTRODUODENOSCOPY (EGD) WITH PROPOFOL;  Surgeon: Vida Rigger, MD;  Location: [facility] ENDOSCOPY;  Service: Endoscopy;  Laterality: N/A;  with possible botox   ESOPHAGOGASTRODUODENOSCOPY (EGD) WITH PROPOFOL N/A 09/30/2021   Procedure: ESOPHAGOGASTRODUODENOSCOPY (EGD) WITH PROPOFOL;  Surgeon: Vida Rigger, MD;  Location: [facility] ENDOSCOPY;  Service: Endoscopy;  Laterality: N/A;   ESOPHAGOGASTRODUODENOSCOPY (EGD) WITH PROPOFOL N/A 03/19/2022   Procedure: ESOPHAGOGASTRODUODENOSCOPY (EGD) WITH PROPOFOL;  Surgeon: Vida Rigger, MD;  Location: [facility] ENDOSCOPY;  Service: Gastroenterology;  Laterality: N/A;   ESOPHAGOGASTRODUODENOSCOPY (EGD) WITH PROPOFOL N/A 05/05/2023   Procedure: ESOPHAGOGASTRODUODENOSCOPY (EGD) WITH PROPOFOL;  Surgeon: Vida Rigger, MD;  Location: [facility] ENDOSCOPY;  Service: Gastroenterology;  Laterality: N/A;  with balloon dilation   FOREIGN BODY REMOVAL  07/19/2018   Procedure: FOREIGN BODY REMOVAL;  Surgeon: Vida Rigger, MD;  Location: [facility] ENDOSCOPY;  Service: Endoscopy;;   FRACTURE SURGERY     Hx: of left wrist 01/2021   GASTRECTOMY  [id number]   GASTRECTOMY N/A    X3   LITHOTRIPSY     Hx; of for kidney stones [id number]   SAVORY DILATION N/A 08/10/2014   Procedure: SAVORY DILATION;  Surgeon: Petra Kuba, MD;  Location: [facility] ENDOSCOPY;  Service: Endoscopy;  Laterality: N/A;   TOTAL HIP ARTHROPLASTY Right 12/30/2017   Procedure: RIGHT TOTAL HIP ARTHROPLASTY;  Surgeon: Vickki Hearing, MD;  Location: [facility];  Service: Orthopedics;  Laterality: Right;   TOTAL HIP ARTHROPLASTY Left 02/17/2018   Procedure: TOTAL HIP ARTHROPLASTY;  Surgeon: Vickki Hearing, MD;  Location: [facility];  Service: Orthopedics;  Laterality: Left;   TUBAL LIGATION     1975    Family History  Problem Relation Age of Onset   Diabetes Mother    Cancer - Prostate Brother       Controlled substance contract: n/a     Review of Systems  Constitutional:  Negative for diaphoresis.  Eyes:  Negative for pain.   Respiratory:  Negative for shortness of breath.   Cardiovascular:  Negative for chest pain, palpitations and leg swelling.  Gastrointestinal:  Negative for abdominal pain.  Endocrine: Negative for polydipsia.  Skin:  Negative for rash.  Neurological:  Negative for dizziness, weakness and headaches.  Hematological:  Does not bruise/bleed easily.  All other systems reviewed and are negative.      Objective:   Physical Exam Vitals and nursing note reviewed.  Constitutional:      General: She is not in acute distress.    Appearance: Normal appearance. She is well-developed.  HENT:     Head: Normocephalic.     Right Ear: Tympanic membrane normal.     Left Ear: Tympanic membrane normal.     Nose: Nose normal.     Mouth/Throat:     Mouth: Mucous membranes are moist.  Eyes:     Pupils: Pupils are equal, round, and reactive to light.  Neck:     Vascular: No carotid bruit or JVD.  Cardiovascular:     Rate and Rhythm: Normal rate and regular rhythm.     Heart sounds: Normal heart sounds.  Pulmonary:     Effort: Pulmonary effort is normal. No respiratory distress.     Breath sounds: Normal breath sounds. No wheezing or rales.  Chest:     Chest wall: No tenderness.  Abdominal:     General: Bowel sounds are normal. There is no distension or abdominal bruit.     Palpations: Abdomen is soft. There is no hepatomegaly, splenomegaly, mass or pulsatile mass.     Tenderness: There is no abdominal tenderness.  Musculoskeletal:        General: Normal range of motion.     Cervical back: Normal range of motion and neck supple.  Lymphadenopathy:     Cervical: No cervical adenopathy.  Skin:    General: Skin is warm and dry.  Neurological:     Mental Status: She is alert and oriented to person, place, and time.     Deep Tendon Reflexes: Reflexes are normal and symmetric.  Psychiatric:        Behavior: Behavior normal.        Thought Content: Thought content normal.        Judgment:  Judgment normal.    BP (!) 103/59   Pulse 77   Temp (!) 97.1 F (36.2 C) (Temporal)   Resp 20   Ht 5\' 5"  (1.651 m)   Wt 100 lb (45.4 kg)   SpO2 96%   BMI 16.64 kg/m         Assessment & Plan:  Brittany Hensley comes in today with chief complaint of Medical Management of Chronic Issues   Diagnosis and orders addressed:  1. Gastroesophageal reflux disease without esophagitis Avoid spicy foods Do not eat 2 hours prior to bedtime  - omeprazole (PRILOSEC) 40 MG capsule; Take 1 capsule (40 mg total) by mouth daily.  Dispense: 90 capsule; Refill: 1  2. Postgastrectomy malabsorption  3. Hypothyroidism due to acquired atrophy of thyroid Labs pending - levothyroxine (SYNTHROID) 100 MCG tablet; Take 1 tablet (100 mcg total) by mouth daily.  Dispense: 90 tablet; Refill: 1 - CBC with Differential/Platelet - CMP14+EGFR - Lipid panel - Thyroid Panel With TSH  4. GAD (generalized anxiety disorder) Stress management - ToxASSURE Select 13 (MW), Urine - ALPRAZolam (XANAX) 0.5 MG tablet; Take 1 tablet (0.5 mg total) by mouth 4 (four) times daily as needed for anxiety.  Dispense: 120 tablet; Refill: 5 - sertraline (ZOLOFT) 100 MG tablet; TAKE TWO (2) TABLETS BY MOUTH DAILY  Dispense: 180 tablet; Refill: 1  5. Iron deficiency anemia due to chronic blood loss Labs pending - folic acid (FOLVITE) 800 MCG tablet; Take 1 tablet (800 mcg total) by mouth daily.  Dispense: 90 tablet; Refill: 1 - sucralfate (CARAFATE) 1 g tablet; Take 1 tablet (1 g total) by mouth 4 (four) times daily -  with meals  and at bedtime.  Dispense: 120 tablet; Refill: 1  6. Rosacea  7. Osteoporosis without pathological fracture Weight bearing exercises Refuses treatment- wants to wait and repeat dexascan next year   8. BMI less than 19,adult Increase protein in diet Daily protein shakes   Labs pending Health Maintenance reviewed Diet and exercise encouraged  Follow up plan: 6 months   Mary-Margaret  Daphine Deutscher, FNP

## 2023-05-11 ENCOUNTER — Encounter: Payer: Self-pay | Admitting: *Deleted

## 2023-05-11 ENCOUNTER — Ambulatory Visit (HOSPITAL_COMMUNITY)
Admission: RE | Admit: 2023-05-11 | Discharge: 2023-05-11 | Disposition: A | Discharge status: Home / Self Care | Payer: PPO | Source: Ambulatory Visit | Attending: Urology | Admitting: Urology

## 2023-05-11 DIAGNOSIS — N200 Calculus of kidney: Secondary | ICD-10-CM | POA: Insufficient documentation

## 2023-05-11 DIAGNOSIS — Z471 Aftercare following joint replacement surgery: Secondary | ICD-10-CM | POA: Diagnosis not present

## 2023-05-11 LAB — CMP14+EGFR
ALT: 13 IU/L (ref 0–32)
AST: 18 IU/L (ref 0–40)
Albumin: 3.8 g/dL (ref 3.8–4.8)
Alkaline Phosphatase: 67 IU/L (ref 44–121)
BUN/Creatinine Ratio: 27 (ref 12–28)
BUN: 41 mg/dL — ABNORMAL HIGH (ref 8–27)
CO2: 15 mmol/L — ABNORMAL LOW (ref 20–29)
Calcium: 9 mg/dL (ref 8.7–10.3)
Chloride: 114 mmol/L — ABNORMAL HIGH (ref 96–106)
Creatinine, Ser: 1.54 mg/dL — ABNORMAL HIGH (ref 0.57–1.00)
Globulin, Total: 1.8 g/dL (ref 1.5–4.5)
Glucose: 96 mg/dL (ref 70–99)
Potassium: 4.2 mmol/L (ref 3.5–5.2)
Sodium: 145 mmol/L — ABNORMAL HIGH (ref 134–144)
Total Protein: 5.6 g/dL — ABNORMAL LOW (ref 6.0–8.5)
eGFR: 36 mL/min/{1.73_m2} — ABNORMAL LOW (ref >59)

## 2023-05-11 LAB — CBC WITH DIFFERENTIAL/PLATELET
Basophils Absolute: 0 10*3/uL (ref 0.0–0.2)
Basos: 1 %
EOS (ABSOLUTE): 0.2 10*3/uL (ref 0.0–0.4)
Eos: 3 %
Hematocrit: 39.2 % (ref 34.0–46.6)
Hemoglobin: 12.6 g/dL (ref 11.1–15.9)
Immature Grans (Abs): 0 10*3/uL (ref 0.0–0.1)
Immature Granulocytes: 0 %
Lymphocytes Absolute: 1.7 10*3/uL (ref 0.7–3.1)
Lymphs: 36 %
MCH: 32.1 pg (ref 26.6–33.0)
MCHC: 32.1 g/dL (ref 31.5–35.7)
MCV: 100 fL — ABNORMAL HIGH (ref 79–97)
Monocytes Absolute: 0.5 10*3/uL (ref 0.1–0.9)
Monocytes: 12 %
Neutrophils Absolute: 2.3 10*3/uL (ref 1.4–7.0)
Neutrophils: 48 %
Platelets: 238 10*3/uL (ref 150–450)
RBC: 3.93 x10E6/uL (ref 3.77–5.28)
RDW: 12.5 % (ref 11.7–15.4)
WBC: 4.7 10*3/uL (ref 3.4–10.8)

## 2023-05-11 LAB — LIPID PANEL
Cholesterol, Total: 151 mg/dL (ref 100–199)
HDL: 50 mg/dL (ref >39)
LDL CALC COMMENT:: 3 ratio (ref 0.0–4.4)
LDL Chol Calc (NIH): 71 mg/dL (ref 0–99)
Triglycerides: 181 mg/dL — ABNORMAL HIGH (ref 0–149)
VLDL Cholesterol Cal: 30 mg/dL (ref 5–40)

## 2023-05-11 LAB — THYROID PANEL WITH TSH
Free Thyroxine Index: 1.6 (ref 1.2–4.9)
T3 Uptake Ratio: 34 % (ref 24–39)
T4, Total: 4.7 ug/dL (ref 4.5–12.0)
TSH: 0.142 u[IU]/mL — ABNORMAL LOW (ref 0.450–4.500)

## 2023-05-11 MED ORDER — LEVOTHYROXINE SODIUM 88 MCG PO TABS: 88.0000 ug | ORAL_TABLET | Freq: Every day | ORAL | 1 refills | Status: DC

## 2023-05-11 NOTE — Addendum Note (Signed)
Addended by: Bennie Pierini on: 05/11/2023 07:51 AM   Modules accepted: Orders

## 2023-05-12 LAB — TOXASSURE SELECT 13 (MW), URINE

## 2023-05-27 ENCOUNTER — Ambulatory Visit: Payer: PPO | Admitting: Urology

## 2023-05-27 ENCOUNTER — Encounter: Payer: Self-pay | Admitting: Urology

## 2023-05-27 VITALS — BP 119/70 | HR 82 | Ht 65.0 in | Wt 100.0 lb

## 2023-05-27 DIAGNOSIS — N390 Urinary tract infection, site not specified: Secondary | ICD-10-CM

## 2023-05-27 DIAGNOSIS — N200 Calculus of kidney: Secondary | ICD-10-CM

## 2023-05-27 DIAGNOSIS — R3129 Other microscopic hematuria: Secondary | ICD-10-CM

## 2023-05-27 DIAGNOSIS — R8281 Pyuria: Secondary | ICD-10-CM

## 2023-05-27 DIAGNOSIS — N179 Acute kidney failure, unspecified: Secondary | ICD-10-CM

## 2023-05-27 LAB — URINALYSIS, ROUTINE W REFLEX MICROSCOPIC
Bilirubin, UA: NEGATIVE
Glucose, UA: NEGATIVE
Ketones, UA: NEGATIVE
Nitrite, UA: NEGATIVE
Protein,UA: NEGATIVE
Specific Gravity, UA: 1.02 (ref 1.005–1.030)
Urobilinogen, Ur: 0.2 mg/dL (ref 0.2–1.0)
pH, UA: 6 (ref 5.0–7.5)

## 2023-05-27 LAB — MICROSCOPIC EXAMINATION

## 2023-05-27 NOTE — Progress Notes (Signed)
Subjective: 1. Kidney stones   2. AKI (acute kidney injury) (HCC)   3. Urinary tract infection without hematuria, site unspecified     Brittany Hensley is a former patient with a history of stones who I last saw in 2009.  She was in the ER on 04/14/21 with gross hematuria and a CT shows bilateral non-obstructing renal stones with a malrotated right kidney.   There is a partial staghorn stone in the LLP that has multiple small stones making it up.  The stones are larger on the right and distributed throughout the kidney.  The bladder is not well seen because of beam hardening from bilateral hip implants.  She has no other voiding complaints.  Her UA today has >30 RBC with WBC and bacteria as well.   06/12/21: Ngun returns today in f/u from cystoscopy with bil RTG, left URS with HLL and stent on 05/06/21.   She had multiple LLP stones that were felt to be the most likely cause of the gross hematuria.  She remains on doxycycline for suppression of an enterococcal UTI.  Her UA today shows 6-10 WBC, >30 RBC's and mod bact.    10/23/21: Brittany Hensley returns today in f/u.  She is doing well without flank pain or hematuria.  She has at most mild dysuria.  Her UA today has 11-30 WBC with 3-10 RBC and many bacteria.   She had a KUB that showed stable right renal stones and left renal stones were not clearly seen. The renal US in 11/22 showed bilateral  renal stones without obstruction but the left renal shadows could be artifact as no stones were noted on KUB.  She missed her last appt with COVID.  She has had no further hematuria since her ureteroscopy in 10/22.     05/28/22: Brittany Hensley returns today for her history of stones.  A CT on 05/22/22 shows stable bilateral renal stones, right > left.  The right side is stable since 9/22 and the left has a significant reduction in stone burden since her ureteroscopy in 10/22.  She has had no flank pain or hematuria.   She has had no voiding complaints. Her UA is unremarkable.   05/27/23:  Brittany Hensley returns today in f/u for her history of stones.  A KUB on 05/11/23 shows stable bilateral renal stones on my review but the report is pending.  A renal US on 05/11/23 shows bilateral stones without obstruction.  Her Cr on 05/10/23 was 1.54 which is up from 0.88 in 4/23.   She has been on Ibuprofen for about a year.  She has some back pain but no flank pain and no new voiding symptoms or gross hematuria.  She has chronic nocturia.    ROS:  Review of Systems  Musculoskeletal:  Positive for back pain.  Psychiatric/Behavioral:  The patient is nervous/anxious.   All other systems reviewed and are negative. The above are only occasional complaints.   Allergies  Allergen Reactions   Augmentin [Amoxicillin-Pot Clavulanate] Other (See Comments)    Bloody stool Did it involve swelling of the face/tongue/throat, SOB, or low BP? No Did it involve sudden or severe rash/hives, skin peeling, or any reaction on the inside of your mouth or nose? No Did you need to seek medical attention at a hospital or doctor's office? No When did it last happen?      1 year If all above answers are "NO", may proceed with cephalosporin use.    Famotidine Other (See Comments)  Fever    Klonopin [Clonazepam] Other (See Comments)    double vision   Other Other (See Comments)   Nitrofurantoin Rash   Reglan [Metoclopramide] Anxiety and Other (See Comments)    Causes confusion   Sulfamethoxazole-Trimethoprim Rash    Past Medical History:  Diagnosis Date   Anemia    iron    Anxiety    Arthritis    COVID    2020 bad cold s/s lasted 1 week. 04/28/2021   Depression    Diverticulitis    Hx: of   GERD (gastroesophageal reflux disease)    at times for 15 yrs 04/28/2021   History of kidney stones    10 years ago   Hypothyroidism    Low iron    Pneumonia    2020   Rosacea    Hx: of   SCC (squamous cell carcinoma) 08/13/2021   in situ-left temple (CX35FU)   SCC (squamous cell carcinoma) 08/13/2021    well diff-right forehead (CX35FU)   Squamous cell carcinoma of skin 08/03/2021   in situ- left malar cheek (CX35FU)    Past Surgical History:  Procedure Laterality Date   APPENDECTOMY     BALLOON DILATION  06/16/2011   Procedure: BALLOON DILATION;  Surgeon: Petra Kuba, MD;  Location: [facility] ENDOSCOPY;  Service: Endoscopy;  Laterality: N/A;   BALLOON DILATION N/A 10/03/2013   Procedure: BALLOON DILATION;  Surgeon: Petra Kuba, MD;  Location: [facility] ENDOSCOPY;  Service: Endoscopy;  Laterality: N/A;   BALLOON DILATION N/A 08/10/2014   Procedure: BALLOON DILATION;  Surgeon: Petra Kuba, MD;  Location: [facility]COPY;  Service: Endoscopy;  Laterality: N/A;  from 12 - 15 cm dilation completed   BALLOON DILATION N/A 02/15/2015   Procedure: BALLOON DILATION;  Surgeon: Vida Rigger, MD;  Location: [facility]OSCOPY;  Service: Endoscopy;  Laterality: N/A;   BALLOON DILATION N/A 11/12/2015   Procedure: BALLOON DILATION;  Surgeon: Vida Rigger, MD;  Location: [facility];  Service: Endoscopy;  Laterality: N/A;   BALLOON DILATION N/A 08/10/2016   Procedure: BALLOON DILATION;  Surgeon: Vida Rigger, MD;  Location: [facility] ENDOSCOPY;  Service: Endoscopy;  Laterality: N/A;   BALLOON DILATION N/A 08/07/2016   Procedure: BALLOON DILATION;  Surgeon: Vida Rigger, MD;  Location: [facility] ENDOSCOPY;  Service: Endoscopy;  Laterality: N/A;   BALLOON DILATION N/A 05/20/2017   Procedure: BALLOON DILATION;  Surgeon: Vida Rigger, MD;  Location: [facility];  Service: Endoscopy;  Laterality: N/A;   BALLOON DILATION N/A 12/02/2017   Procedure: BALLOON DILATION;  Surgeon: Vida Rigger, MD;  Location: [facility] ENDOSCOPY;  Service: Endoscopy;  Laterality: N/A;   BALLOON DILATION N/A 07/19/2018   Procedure: BALLOON DILATION;  Surgeon: Vida Rigger, MD;  Location: [facility];  Service: Endoscopy;  Laterality: N/A;   BALLOON DILATION N/A 01/23/2019   Procedure: BALLOON DILATION;  Surgeon: Vida Rigger, MD;  Location: [facility];  Service: Endoscopy;   Laterality: N/A;   BALLOON DILATION N/A 07/13/2019   Procedure: BALLOON DILATION;  Surgeon: Vida Rigger, MD;  Location: [facility];  Service: Endoscopy;  Laterality: N/A;   BALLOON DILATION N/A 02/02/2020   Procedure: BALLOON DILATION;  Surgeon: Vida Rigger, MD;  Location: [facility];  Service: Endoscopy;  Laterality: N/A;   BALLOON DILATION N/A 07/16/2020   Procedure: BALLOON DILATION;  Surgeon: Vida Rigger, MD;  Location: [facility];  Service: Endoscopy;  Laterality: N/A;   BALLOON DILATION N/A 02/19/2021   Procedure: BALLOON DILATION;  Surgeon: Vida Rigger, MD;  Location: [facility];  Service:  Endoscopy;  Laterality: N/A;   BALLOON DILATION N/A 09/30/2021   Procedure: BALLOON DILATION;  Surgeon: Vida Rigger, MD;  Location: [facility]OS[facility];  Service: Endoscopy;  Laterality: N/A;   BALLOON DILATION N/A 03/19/2022   Procedure: BALLOON DILATION;  Surgeon: Vida Rigger, MD;  Location: [facility];  Service: Gastroenterology;  Laterality: N/A;   BALLOON DILATION N/A 05/05/2023   Procedure: BALLOON DILATION;  Surgeon: Vida Rigger, MD;  Location: [facility]OS[facility];  Service: Gastroenterology;  Laterality: N/A;   CATARACT EXTRACTION, BILATERAL     2019   CHOLECYSTECTOMY OPEN  1979   COLONOSCOPY     Hx: of   CYSTOSCOPY/URETEROSCOPY/HOLMIUM LASER/STENT PLACEMENT Bilateral 05/06/2021   Procedure: CYSTOSCOPY BILATERAL RETROGRADE LEFT URETEROSCOPY/HOLMIUM LASER/STENT PLACEMENT;  Surgeon: Bjorn Pippin, MD;  Location: [facility];  Service: Urology;  Laterality: Bilateral;   ESOPHAGOGASTRODUODENOSCOPY  06/16/2011   Procedure: ESOPHAGOGASTRODUODENOSCOPY (EGD);  Surgeon: Petra Kuba, MD;  Location: [facility] ENDOSCOPY;  Service: Endoscopy;  Laterality: N/A;   ESOPHAGOGASTRODUODENOSCOPY N/A 10/03/2013   Procedure: ESOPHAGOGASTRODUODENOSCOPY (EGD);  Surgeon: Petra Kuba, MD;  Location: [facility] ENDOSCOPY;  Service: Endoscopy;  Laterality: N/A;   ESOPHAGOGASTRODUODENOSCOPY N/A 08/10/2014   Procedure:  ESOPHAGOGASTRODUODENOSCOPY (EGD);  Surgeon: Petra Kuba, MD;  Location: [facility] ENDOSCOPY;  Service: Endoscopy;  Laterality: N/A;   ESOPHAGOGASTRODUODENOSCOPY N/A 08/10/2016   Procedure: ESOPHAGOGASTRODUODENOSCOPY (EGD);  Surgeon: Vida Rigger, MD;  Location: [facility] ENDOSCOPY;  Service: Endoscopy;  Laterality: N/A;   ESOPHAGOGASTRODUODENOSCOPY N/A 07/19/2018   Procedure: ESOPHAGOGASTRODUODENOSCOPY (EGD);  Surgeon: Vida Rigger, MD;  Location: [facility] ENDOSCOPY;  Service: Endoscopy;  Laterality: N/A;   ESOPHAGOGASTRODUODENOSCOPY (EGD) WITH PROPOFOL N/A 02/15/2015   Procedure: ESOPHAGOGASTRODUODENOSCOPY (EGD) WITH PROPOFOL;  Surgeon: Vida Rigger, MD;  Location[facility]OS[facility];  Service: Endoscopy;  Laterality: N/A;   ESOPHAGOGASTRODUODENOSCOPY (EGD) WITH PROPOFOL N/A 11/12/2015   Procedure: ESOPHAGOGASTRODUODENOSCOPY (EGD) WITH PROPOFOL;  Surgeon: Vida Rigger, MD;  Location: [facility];  Service: Endoscopy;  Laterality: N/A;   ESOPHAGOGASTRODUODENOSCOPY (EGD) WITH PROPOFOL N/A 08/07/2016   Procedure: ESOPHAGOGASTRODUODENOSCOPY (EGD) WITH PROPOFOL;  Surgeon: Vida Rigger, MD;  Location: [facility] ENDOSCOPY;  Service: Endoscopy;  Laterality: N/A;   ESOPHAGOGASTRODUODENOSCOPY (EGD) WITH PROPOFOL N/A 05/20/2017   Procedure: ESOPHAGOGASTRODUODENOSCOPY (EGD) WITH PROPOFOL;  Surgeon: Vida Rigger, MD;  Location: [facility];  Service: Endoscopy;  Laterality: N/A;   ESOPHAGOGASTRODUODENOSCOPY (EGD) WITH PROPOFOL N/A 12/02/2017   Procedure: ESOPHAGOGASTRODUODENOSCOPY (EGD) WITH PROPOFOL;  Surgeon: Vida Rigger, MD;  Location: [facility] ENDOSCOPY;  Service: Endoscopy;  Laterality: N/A;  have c arm available   ESOPHAGOGASTRODUODENOSCOPY (EGD) WITH PROPOFOL N/A 01/23/2019   Procedure: ESOPHAGOGASTRODUODENOSCOPY (EGD) WITH PROPOFOL;  Surgeon: Vida Rigger, MD;  Location[facility];  Service: Endoscopy;  Laterality: N/A;   ESOPHAGOGASTRODUODENOSCOPY (EGD) WITH PROPOFOL N/A 07/13/2019   Procedure: ESOPHAGOGASTRODUODENOSCOPY (EGD) WITH PROPOFOL;   Surgeon: Vida Rigger, MD;  Location: [facility];  Service: Endoscopy;  Laterality: N/A;   ESOPHAGOGASTRODUODENOSCOPY (EGD) WITH PROPOFOL N/A 02/02/2020   Procedure: ESOPHAGOGASTRODUODENOSCOPY (EGD) WITH PROPOFOL;  Surgeon: Vida Rigger, MD;  Location: [facility];  Service: Endoscopy;  Laterality: N/A;   ESOPHAGOGASTRODUODENOSCOPY (EGD) WITH PROPOFOL N/A 07/16/2020   Procedure: ESOPHAGOGASTRODUODENOSCOPY (EGD) WITH PROPOFOL;  Surgeon: Vida Rigger, MD;  Location: [facility];  Service: Endoscopy;  Laterality: N/A;   ESOPHAGOGASTRODUODENOSCOPY (EGD) WITH PROPOFOL N/A 02/19/2021   Procedure: ESOPHAGOGASTRODUODENOSCOPY (EGD) WITH PROPOFOL;  Surgeon: Vida Rigger, MD;  Location: [facility];  Service: Endoscopy;  Laterality: N/A;  with possible botox   ESOPHAGOGASTRODUODENOSCOPY (EGD) WITH PROPOFOL N/A 09/30/2021   Procedure: ESOPHAGOGASTRODUODENOSCOPY (EGD) WITH PROPOFOL;  Surgeon: Vida Rigger, MD;  Location: [facility];  Service: Endoscopy;  Laterality: N/A;   ESOPHAGOGASTRODUODENOSCOPY (EGD) WITH PROPOFOL N/A 03/19/2022   Procedure: ESOPHAGOGASTRODUODENOSCOPY (EGD) WITH PROPOFOL;  Surgeon: Vida Rigger, MD;  Location: [facility];  Service: Gastroenterology;  Laterality: N/A;   ESOPHAGOGASTRODUODENOSCOPY (EGD) WITH PROPOFOL N/A 05/05/2023   Procedure: ESOPHAGOGASTRODUODENOSCOPY (EGD) WITH PROPOFOL;  Surgeon: Vida Rigger, MD;  Location: [facility] ENDOS[facility];  Service: Gastroenterology;  Laterality: N/A;  with balloon dilation   FOREIGN BODY REMOVAL  07/19/2018   Procedure: FOREIGN BODY REMOVAL;  Surgeon: Vida Rigger, MD;  Location: [facility] ENDOS[facility];  Service: Endoscopy;;   FRACTURE SURGERY     Hx: of left wrist 01/2021   GASTRECTOMY  [phone]   GASTRECTOMY N/A    X3   LITHOTRIPSY     Hx; of for kidney stones [id number]   SAVORY DILATION N/A 08/10/2014   Procedure: SAVORY DILATION;  Surgeon: Petra Kuba, MD;  Location: [facility] ENDOSCOPY;  Service: Endoscopy;  Laterality: N/A;   TOTAL HIP ARTHROPLASTY Right  12/30/2017   Procedure: RIGHT TOTAL HIP ARTHROPLASTY;  Surgeon: Vickki Hearing, MD;  Location: [facility];  Service: Orthopedics;  Laterality: Right;   TOTAL HIP ARTHROPLASTY Left 02/17/2018   Procedure: TOTAL HIP ARTHROPLASTY;  Surgeon: Vickki Hearing, MD;  Location: [facility];  Service: Orthopedics;  Laterality: Left;   TUBAL LIGATION     1975    Social History   Socioeconomic History   Marital status: Widowed    Spouse name: Not on file   Number of children: 1   Years of education: Not on file   Highest education level: Not on file  Occupational History   Not on file  Tobacco Use   Smoking status: Never   Smokeless tobacco: Never  Vaping Use   Vaping status: Never Used  Substance and Sexual Activity   Alcohol use: No   Drug use: No   Sexual activity: Not Currently  Other Topics Concern   Not on file  Social History Narrative   1 son-[name], lives next door.   Widowed since 2002   Social Determinants of Health   Financial Resource Strain: Low Risk  (10/12/2022)   Overall Financial Resource Strain (CARDIA)    Difficulty of Paying Living Expenses: Not hard at all  Food Insecurity: No Food Insecurity (10/12/2022)   Hunger Vital Sign    Worried About Running Out of Food in the Last Year: Never true    Ran Out of Food in the Last Year: Never true  Transportation Needs: No Transportation Needs (10/12/2022)   PRAPARE - Administrator, Civil Service (Medical): No    Lack of Transportation (Non-Medical): No  Physical Activity: Sufficiently Active (10/12/2022)   Exercise Vital Sign    Days of Exercise per Week: 5 days    Minutes of Exercise per Session: 30 min  Stress: No Stress Concern Present (10/12/2022)   [employer] of Occupational Health - Occupational Stress Questionnaire    Feeling of Stress : Not at all  Social Connections: Moderately Isolated (10/12/2022)   Social Connection and Isolation Panel [NHANES]    Frequency of Communication with Friends  and Family: More than three times a week    Frequency of Social Gatherings with Friends and Family: More than three times a week    Attends Religious Services: More than 4 times per year    Active Member of [employer] or Organizations: No    Attends Banker Meetings: Never    Marital Status: Widowed  Intimate Partner Violence: Not At Risk (10/12/2022)   Humiliation, Afraid, Rape, and Kick questionnaire    Fear of Current or Ex-Partner: No    Emotionally Abused: No    Physically Abused: No    Sexually Abused: No    Family History  Problem Relation Age of Onset   Diabetes Mother    Cancer - Prostate Brother     Anti-infectives: Anti-infectives (From admission, onward)    None       Current Outpatient Medications  Medication Sig Dispense Refill   acetaminophen (TYLENOL) 500 MG tablet Take 500 mg by mouth 2 (two) times daily.     albuterol (VENTOLIN HFA) 108 (90 Base) MCG/ACT inhaler USE 2 PUFFS EVERY 6 HOURS AS NEEDED 8.5 g 2   ALPRAZolam (XANAX) 0.5 MG tablet Take 1 tablet (0.5 mg total) by mouth 4 (four) times daily as needed for anxiety. 120 tablet 5   Ascorbic Acid (VITAMIN C) 1000 MG tablet Take 1,000 mg by mouth daily.     Biotin 10 MG CAPS Take 10 mg by mouth daily.     BLACK ELDERBERRY PO Take 50 mg by mouth daily.     calcium citrate (CALCITRATE - DOSED IN MG ELEMENTAL CALCIUM) 950 (200 Ca) MG tablet Take 2 tablets by mouth 2 (two) times daily.     Cholecalciferol (VITAMIN D) 2000 units tablet Take 2,000 Units by mouth 2 (two) times daily.     diclofenac Sodium (VOLTAREN) 1 % GEL Apply 4 g topically daily as needed (Hip pain). 4 g 5   docusate sodium (COLACE) 100 MG capsule Take 1 capsule (100 mg total) by mouth 2 (two) times daily. 10 capsule 0   folic acid (FOLVITE) 800 MCG tablet Take 1 tablet (800 mcg total) by mouth daily. 90 tablet 1   levothyroxine (SYNTHROID) 88 MCG tablet Take 1 tablet (88 mcg total) by mouth daily. 90 tablet 1   loratadine (CLARITIN)  10 MG tablet TAKE ONE (1) TABLET EACH DAY 90 tablet 1   MAGNESIUM CITRATE PO Take 500 mg by mouth 2 (two) times daily.     meclizine (ANTIVERT) 12.5 MG tablet Take 12.5 mg by mouth 3 (three) times daily as needed for dizziness.     Multiple Vitamins-Minerals (CENTRUM SILVER) CHEW Chew 1 tablet by mouth 2 (two) times daily.     omeprazole (PRILOSEC) 40 MG capsule Take 1 capsule (40 mg total) by mouth daily. 90 capsule 1   ondansetron (ZOFRAN-ODT) 4 MG disintegrating tablet Take 1 tablet (4 mg total) by mouth 2 (two) times daily. 30 tablet 1   Polyethyl Glycol-Propyl Glycol (SYSTANE OP) Place 1 drop into both eyes daily as needed (dry eyes).     Probiotic Product (PROBIOTIC PO) Take 1 capsule by mouth daily.      sertraline (ZOLOFT) 100 MG tablet TAKE TWO (2) TABLETS BY MOUTH DAILY 180 tablet 1   sucralfate (CARAFATE) 1 g tablet Take 1 tablet (1 g total) by mouth 4 (four) times daily -  with meals and at bedtime. 120 tablet 1   Turmeric Curcumin 500 MG CAPS Take 500 mg by mouth daily.     No current facility-administered medications for this visit.     Objective: Vital signs in last 24 hours: BP 119/70   Pulse 82   Ht 5\' 5"  (1.651 m)   Wt 100 lb (45.4 kg)   BMI 16.64 kg/m   Intake/Output from previous day: No intake/output data recorded. Intake/Output this shift: @IOTHISSHIFT @  Physical Exam Vitals reviewed.  Constitutional:      Appearance: Normal appearance.  Neurological:     Mental Status: She is alert.     Lab Results:  Results for orders placed or performed in visit on 05/27/23 (from the past 24 hour(s))  Urinalysis, Routine w reflex microscopic     Status: Abnormal   Collection Time: 05/27/23  2:11 PM  Result Value Ref Range   Specific Gravity, UA 1.020 1.005 - 1.030   pH, UA 6.0 5.0 - 7.5   Color, UA Yellow Yellow   Appearance Ur Clear Clear   Leukocytes,UA 1+ (A) Negative   Protein,UA Negative Negative/Trace   Glucose, UA Negative Negative   Ketones, UA  Negative Negative   RBC, UA Trace (A) Negative   Bilirubin, UA Negative Negative   Urobilinogen, Ur 0.2 0.2 - 1.0 mg/dL   Nitrite, UA Negative Negative   Microscopic Examination See below:    Narrative   Performed at:  [address] - [facility] [address], [place], [place]  [id number] Lab Director: Chinita Pester MT, Phone:  [phone]  Microscopic Examination     Status: Abnormal   Collection Time: 05/27/23  2:11 PM   Urine  Result Value Ref Range   WBC, UA 11-30 (A) 0 - 5 /hpf   RBC, Urine 3-10 (A) 0 - 2 /hpf   Epithelial Cells (non renal) 0-10 0 - 10 /hpf   Bacteria, UA Few (A) None seen/Few   Narrative   Performed at:  [address] - [facility] [address] [place], [place]  [id number] Lab Director: Chinita Pester MT, Phone:  [phone]  Basic metabolic panel     Status: Abnormal   Collection Time: 05/27/23  2:35 PM  Result Value Ref Range   Glucose 102 (H) 70 - 99 mg/dL   BUN 36 (H) 8 - 27 mg/dL   Creatinine, Ser 0.62 (H) 0.57 - 1.00 mg/dL   eGFR 56 (L) >37 SE/GBT/5.17   BUN/Creatinine Ratio 34 (H) 12 - 28   Sodium 143 134 - 144 mmol/L   Potassium 4.7 3.5 - 5.2 mmol/L   Chloride 111 (H) 96 - 106 mmol/L   CO2 20 20 - 29 mmol/L   Calcium 9.6 8.7 - 10.3 mg/dL   Narrative   Performed at:  [address] [place], [place]  [id number] Lab Director: Jolene Schimke MD, Phone:  [phone]    UA has 11-30 WBC and 3-10 RBC with few bacteria.    BMET Recent Labs    05/27/23 1435  NA 143  K 4.7  CL 111*  CO2 20  GLUCOSE 102*  BUN 36*  CREATININE 1.05*  CALCIUM 9.6   PT/INR No results for input(s): "LABPROT", "INR" in the last 72 hours. ABG No results for input(s): "PHART", "HCO3" in the last 72 hours.  Invalid input(s): "PCO2", "PO2" Recent Results (from the past 2160 hour(s))  CBC with Differential (Cancer Center Only)     Status: Abnormal   Collection Time: 03/17/23 10:03 AM  Result Value Ref Range   WBC Count 5.0 4.0 - 10.5  K/uL   RBC 3.79 (L) 3.87 - 5.11 MIL/uL   Hemoglobin 12.3 12.0 - 15.0 g/dL   HCT 70.3 50.0 - 93.8 %   MCV 99.7 80.0 - 100.0 fL   MCH 32.5 26.0 - 34.0 pg   MCHC 32.5 30.0 - 36.0 g/dL   RDW 18.2 99.3 - 71.6 %   Platelet Count 254 150 -  400 K/uL   nRBC 0.0 0.0 - 0.2 %   Neutrophils Relative % 50 %   Neutro Abs 2.5 1.7 - 7.7 K/uL   Lymphocytes Relative 35 %   Lymphs Abs 1.8 0.7 - 4.0 K/uL   Monocytes Relative 10 %   Monocytes Absolute 0.5 0.1 - 1.0 K/uL   Eosinophils Relative 4 %   Eosinophils Absolute 0.2 0.0 - 0.5 K/uL   Basophils Relative 1 %   Basophils Absolute 0.1 0.0 - 0.1 K/uL   Immature Granulocytes 0 %   Abs Immature Granulocytes 0.01 0.00 - 0.07 K/uL    Comment: Performed at [facility], [address], [place], [place] [address]  Ferritin     Status: None   Collection Time: 03/17/23 10:04 AM  Result Value Ref Range   Ferritin 55 11 - 307 ng/mL    Comment: Performed at [facility], [address], [place], [place] [address]  VITAMIN D 25 Hydroxy (Vit-D Deficiency, Fractures)     Status: None   Collection Time: 03/17/23 12:16 PM  Result Value Ref Range   Vit D, 25-Hydroxy 71.70 30 - 100 ng/mL    Comment: (NOTE) Vitamin D deficiency has been defined by the Institute of Medicine  and an Endocrine Society practice guideline as a level of serum 25-OH  vitamin D less than 20 ng/mL (1,2). The Endocrine Society went on to  further define vitamin D insufficiency as a level between 21 and 29  ng/mL (2).  1. IOM (Institute of Medicine). 2010. Dietary reference intakes for  calcium and D. [place]: [employer]. 2. Holick MF, Binkley , Bischoff-Ferrari HA, et al. Evaluation,  treatment, and prevention of vitamin D deficiency: an Endocrine  Society clinical practice guideline, JCEM. 2011 Jul; 96(7): 1911-30.  Performed at [facility], [address] [place], [place] [address]   Glucose, capillary      Status: None   Collection Time: 05/05/23 10:04 AM  Result Value Ref Range   Glucose-Capillary 79 70 - 99 mg/dL    Comment: Glucose reference range applies only to samples taken after fasting for at least 8 hours.  [employer] Select 13 (MW), Urine     Status: None   Collection Time: 05/10/23 12:05 PM  Result Value Ref Range   Summary Note     Comment: ==================================================================== [employer] Select 13 (MW) ==================================================================== Test                             Result       Flag       Units  Drug Present and Declared for Prescription Verification   Alprazolam                     145          EXPECTED   ng/mg creat   Alpha-hydroxyalprazolam        470          EXPECTED   ng/mg creat    Source of alprazolam is a scheduled prescription medication. Alpha-    hydroxyalprazolam is an expected metabolite of alprazolam.  ==================================================================== Test                      Result    Flag   Units      Ref Range   Creatinine  66               mg/dL      >=16 ==================================================================== Declared Medications:  The flagging and interpretation on this report are based on the  following declared medications.  Unexpected results may arise from  inaccuraci es in the declared medications.   **Note: The testing scope of this panel includes these medications:   Alprazolam (Xanax)   **Note: The testing scope of this panel does not include the  following reported medications:   Acetaminophen (Tylenol)  Albuterol (Ventolin HFA)  Biotin  Calcium  Diclofenac (Voltaren)  Docusate (Colace)  Eye Drop (Systane)  Folic Acid  Levothyroxine (Synthroid)  Loratadine  Magnesium  Meclizine (Antivert)  Multivitamin (Centrum)  Nystatin  Nystatin (Mycolog-II)  Omeprazole (Prilosec)  Ondansetron (Zofran)  Probiotic  Sertraline  (Zoloft)  Sucralfate (Carafate)  Supplement  Triamcinolone (Mycolog-II)  Turmeric  Vitamin C  Vitamin D ==================================================================== For clinical consultation, please call [phone]. ====================================================================   CBC with Differential/Platelet     Status: Abnormal   Collection Time: 05/10/23 12:08 PM  Result Value Ref Range   WBC 4.7 3.4 - 10.8 x10E3/uL   RBC 3.93 3.77 - 5.28 x10E6/uL   Hemoglobin 12.6 11.1 - 15.9 g/dL   Hematocrit 81.1 91.4 - 46.6 %   MCV 100 (H) 79 - 97 fL   MCH 32.1 26.6 - 33.0 pg   MCHC 32.1 31.5 - 35.7 g/dL   RDW 78.2 95.6 - 21.3 %   Platelets 238 150 - 450 x10E3/uL   Neutrophils 48 Not Estab. %   Lymphs 36 Not Estab. %   Monocytes 12 Not Estab. %   Eos 3 Not Estab. %   Basos 1 Not Estab. %   Neutrophils Absolute 2.3 1.4 - 7.0 x10E3/uL   Lymphocytes Absolute 1.7 0.7 - 3.1 x10E3/uL   Monocytes Absolute 0.5 0.1 - 0.9 x10E3/uL   EOS (ABSOLUTE) 0.2 0.0 - 0.4 x10E3/uL   Basophils Absolute 0.0 0.0 - 0.2 x10E3/uL   Immature Granulocytes 0 Not Estab. %   Immature Grans (Abs) 0.0 0.0 - 0.1 x10E3/uL  CMP14+EGFR     Status: Abnormal   Collection Time: 05/10/23 12:08 PM  Result Value Ref Range   Glucose 96 70 - 99 mg/dL   BUN 41 (H) 8 - 27 mg/dL   Creatinine, Ser 0.86 (H) 0.57 - 1.00 mg/dL   eGFR 36 (L) >57 QI/ONG/2.95   BUN/Creatinine Ratio 27 12 - 28   Sodium 145 (H) 134 - 144 mmol/L   Potassium 4.2 3.5 - 5.2 mmol/L   Chloride 114 (H) 96 - 106 mmol/L   CO2 15 (L) 20 - 29 mmol/L   Calcium 9.0 8.7 - 10.3 mg/dL   Total Protein 5.6 (L) 6.0 - 8.5 g/dL   Albumin 3.8 3.8 - 4.8 g/dL   Globulin, Total 1.8 1.5 - 4.5 g/dL   Bilirubin Total <2.8 0.0 - 1.2 mg/dL   Alkaline Phosphatase 67 44 - 121 IU/L   AST 18 0 - 40 IU/L   ALT 13 0 - 32 IU/L  Lipid panel     Status: Abnormal   Collection Time: 05/10/23 12:08 PM  Result Value Ref Range   Cholesterol, Total 151 100 - 199 mg/dL    Triglycerides 413 (H) 0 - 149 mg/dL   HDL 50 >24 mg/dL   VLDL Cholesterol Cal 30 5 - 40 mg/dL   LDL Chol Calc (NIH) 71 0 - 99 mg/dL   Chol/HDL Ratio 3.0 0.0 -  4.4 ratio    Comment:                                   T. Chol/HDL Ratio                                             Men  Women                               1/2 Avg.Risk  3.4    3.3                                   Avg.Risk  5.0    4.4                                2X Avg.Risk  9.6    7.1                                3X Avg.Risk 23.4   11.0   Thyroid Panel With TSH     Status: Abnormal   Collection Time: 05/10/23 12:08 PM  Result Value Ref Range   TSH 0.142 (L) 0.450 - 4.500 uIU/mL   T4, Total 4.7 4.5 - 12.0 ug/dL   T3 Uptake Ratio 34 24 - 39 %   Free Thyroxine Index 1.6 1.2 - 4.9  Urinalysis, Routine w reflex microscopic     Status: Abnormal   Collection Time: 05/27/23  2:11 PM  Result Value Ref Range   Specific Gravity, UA 1.020 1.005 - 1.030   pH, UA 6.0 5.0 - 7.5   Color, UA Yellow Yellow   Appearance Ur Clear Clear   Leukocytes,UA 1+ (A) Negative   Protein,UA Negative Negative/Trace   Glucose, UA Negative Negative   Ketones, UA Negative Negative   RBC, UA Trace (A) Negative   Bilirubin, UA Negative Negative   Urobilinogen, Ur 0.2 0.2 - 1.0 mg/dL   Nitrite, UA Negative Negative   Microscopic Examination See below:     Comment: Microscopic was indicated and was performed.  Microscopic Examination     Status: Abnormal   Collection Time: 05/27/23  2:11 PM   Urine  Result Value Ref Range   WBC, UA 11-30 (A) 0 - 5 /hpf   RBC, Urine 3-10 (A) 0 - 2 /hpf   Epithelial Cells (non renal) 0-10 0 - 10 /hpf   Bacteria, UA Few (A) None seen/Few  Basic metabolic panel     Status: Abnormal   Collection Time: 05/27/23  2:35 PM  Result Value Ref Range   Glucose 102 (H) 70 - 99 mg/dL   BUN 36 (H) 8 - 27 mg/dL   Creatinine, Ser 1.61 (H) 0.57 - 1.00 mg/dL   eGFR 56 (L) >09 UE/AVW/0.98   BUN/Creatinine Ratio 34 (H) 12 -  28   Sodium 143 134 - 144 mmol/L   Potassium 4.7 3.5 - 5.2 mmol/L   Chloride 111 (H) 96 - 106 mmol/L   CO2 20 20 - 29 mmol/L   Calcium 9.6 8.7 - 10.3  mg/dL    Studies/Results: No results found. US RENAL  Result Date: 05/11/2023 CLINICAL DATA:  Nephrolithiasis EXAM: RENAL / URINARY TRACT ULTRASOUND COMPLETE COMPARISON:  CT abdomen pelvis 05/05/2022 FINDINGS: Right Kidney: Renal measurements: 9.0 x 4.7 x 4.1 cm = volume: 91.5 mL. Normal renal cortical thickness and echogenicity. No hydronephrosis. 10 mm stone. Additional smaller stones identified. Left Kidney: Renal measurements: 10.3 x 5.7 x 5.6 cm = volume: 171 mL. Normal renal cortical thickness and echogenicity. No hydronephrosis. Multiple stones, largest in the inferior pole measuring 9 mm. Additionally there is a 10 mm cyst. No imaging follow-up needed. Bladder: Appears normal for degree of bladder distention. Other: None. IMPRESSION: 1. Bilateral nephrolithiasis. 2. No hydronephrosis. Electronically Signed   By: Annia Belt M.D.   On: 05/11/2023 21:39    Assessment/Plan: Bilateral renal stones and right renal malrotation.  She is doing well with stable renal stones and no obstruction.    F/u in 1 year with a KUB and renal US.    Per his UTI.  UA has pyuria and microhematuria.  This is chronic but I will get a culture.  AKI.  She has an elevation of her Cr to 1.54 with a decline in the GFR to 36 from 70 in 4/23.  She has been on Ibuprofen for a year and also uses voltaren gel.   I have asked her to stop those.  I will repeat labs today and have notified Paulene Floor NP so she can follow up for this and arrange a nephrology referral if need be.  Her repeat Cr is down to 1.05 today.   No orders of the defined types were placed in this encounter.    Orders Placed This Encounter  Procedures   Urine Culture   Microscopic Examination   DG Abd 1 View    Standing Status:   Future    Standing Expiration Date:   05/26/2024    Order Specific  Question:   Reason for Exam (SYMPTOM  OR DIAGNOSIS REQUIRED)    Answer:   renal stones    Order Specific Question:   Preferred imaging location?    Answer:   [facility]    Order Specific Question:   Radiology Contrast Protocol - do NOT remove file path    Answer:   [email or web address]   US RENAL    Standing Status:   Future    Standing Expiration Date:   05/26/2024    Order Specific Question:   Reason for Exam (SYMPTOM  OR DIAGNOSIS REQUIRED)    Answer:   renal stones    Order Specific Question:   Preferred imaging location?    Answer:   [facility]   Urinalysis, Routine w reflex microscopic   Basic metabolic panel     Return in about 1 year (around 05/26/2024).    CC: Bennie Pierini FNP.      Bjorn Pippin 05/28/2023 [phone]

## 2023-05-28 LAB — BASIC METABOLIC PANEL
BUN/Creatinine Ratio: 34 — ABNORMAL HIGH (ref 12–28)
BUN: 36 mg/dL — ABNORMAL HIGH (ref 8–27)
CO2: 20 mmol/L (ref 20–29)
Calcium: 9.6 mg/dL (ref 8.7–10.3)
Chloride: 111 mmol/L — ABNORMAL HIGH (ref 96–106)
Creatinine, Ser: 1.05 mg/dL — ABNORMAL HIGH (ref 0.57–1.00)
Glucose: 102 mg/dL — ABNORMAL HIGH (ref 70–99)
Potassium: 4.7 mmol/L (ref 3.5–5.2)
Sodium: 143 mmol/L (ref 134–144)
eGFR: 56 mL/min/{1.73_m2} — ABNORMAL LOW (ref >59)

## 2023-05-29 LAB — URINE CULTURE: Organism ID, Bacteria: NO GROWTH

## 2023-06-02 ENCOUNTER — Telehealth: Payer: Self-pay

## 2023-06-02 NOTE — Telephone Encounter (Signed)
-----   Message from Bjorn Pippin sent at 05/28/2023  8:14 AM EDT ----- Please let her know that her Creatinine is down to 1.05 and her GFR is up to 56 which is Chronic Kidney disease 3a and much better than before.   I still think it is best to avoid the ibuprofen and f/u with her PCP.

## 2023-06-02 NOTE — Telephone Encounter (Addendum)
Patient is aware of MD's response to labs and to follow up with PCP regarding referral to nephrology.  Patient is asking if based on her results if this needs to be an urgent referral and if you recommend a nephrologist.

## 2023-06-03 ENCOUNTER — Telehealth: Payer: Self-pay

## 2023-06-03 NOTE — Telephone Encounter (Signed)
The patient requested to schedule laboratory work within the next two days. I attempted to return her call but was unable to reach her. I will follow up with the patient.

## 2023-06-03 NOTE — Telephone Encounter (Signed)
She is asking if the increase in BUN is concern with the increase?

## 2023-06-03 NOTE — Telephone Encounter (Signed)
Patient is aware of MD response.

## 2023-06-04 NOTE — Telephone Encounter (Signed)
Left voicemail I will return call Monday morning.

## 2023-06-07 NOTE — Telephone Encounter (Signed)
Patient is aware of MD response and will follow up with PCP as recommended. Labs faxed to PCP

## 2023-06-17 ENCOUNTER — Other Ambulatory Visit: Payer: Self-pay | Admitting: *Deleted

## 2023-06-17 ENCOUNTER — Telehealth: Payer: Self-pay

## 2023-06-17 ENCOUNTER — Inpatient Hospital Stay: Payer: PPO

## 2023-06-17 ENCOUNTER — Inpatient Hospital Stay: Payer: PPO | Attending: Oncology | Admitting: Oncology

## 2023-06-17 VITALS — BP 143/77 | HR 100 | Temp 97.9°F | Resp 18 | Ht 65.0 in | Wt 100.8 lb

## 2023-06-17 DIAGNOSIS — D500 Iron deficiency anemia secondary to blood loss (chronic): Secondary | ICD-10-CM | POA: Insufficient documentation

## 2023-06-17 DIAGNOSIS — K922 Gastrointestinal hemorrhage, unspecified: Secondary | ICD-10-CM | POA: Diagnosis not present

## 2023-06-17 LAB — CBC WITH DIFFERENTIAL (CANCER CENTER ONLY)
Abs Immature Granulocytes: 0.01 10*3/uL (ref 0.00–0.07)
Basophils Absolute: 0 10*3/uL (ref 0.0–0.1)
Basophils Relative: 0 %
Eosinophils Absolute: 0.1 10*3/uL (ref 0.0–0.5)
Eosinophils Relative: 2 %
HCT: 39.3 % (ref 36.0–46.0)
Hemoglobin: 12.7 g/dL (ref 12.0–15.0)
Immature Granulocytes: 0 %
Lymphocytes Relative: 31 %
Lymphs Abs: 1.5 10*3/uL (ref 0.7–4.0)
MCH: 32.5 pg (ref 26.0–34.0)
MCHC: 32.3 g/dL (ref 30.0–36.0)
MCV: 100.5 fL — ABNORMAL HIGH (ref 80.0–100.0)
Monocytes Absolute: 0.4 10*3/uL (ref 0.1–1.0)
Monocytes Relative: 8 %
Neutro Abs: 2.9 10*3/uL (ref 1.7–7.7)
Neutrophils Relative %: 59 %
Platelet Count: 241 10*3/uL (ref 150–400)
RBC: 3.91 MIL/uL (ref 3.87–5.11)
RDW: 12.9 % (ref 11.5–15.5)
WBC Count: 4.9 10*3/uL (ref 4.0–10.5)
nRBC: 0 % (ref 0.0–0.2)

## 2023-06-17 LAB — BASIC METABOLIC PANEL - CANCER CENTER ONLY
Anion gap: 10 (ref 5–15)
BUN: 31 mg/dL — ABNORMAL HIGH (ref 8–23)
CO2: 21 mmol/L — ABNORMAL LOW (ref 22–32)
Calcium: 9.4 mg/dL (ref 8.9–10.3)
Chloride: 111 mmol/L (ref 98–111)
Creatinine: 1.09 mg/dL — ABNORMAL HIGH (ref 0.44–1.00)
GFR, Estimated: 54 mL/min — ABNORMAL LOW (ref >60)
Glucose, Bld: 149 mg/dL — ABNORMAL HIGH (ref 70–99)
Potassium: 3.8 mmol/L (ref 3.5–5.1)
Sodium: 142 mmol/L (ref 135–145)

## 2023-06-17 LAB — FERRITIN: Ferritin: 222 ng/mL (ref 11–307)

## 2023-06-17 NOTE — Telephone Encounter (Signed)
-----   Message from Thornton Papas sent at 06/17/2023  1:43 PM EST ----- Please call patient, the creatinine is mildly elevated and stable.  She should follow-up with her primary provider.

## 2023-06-17 NOTE — Telephone Encounter (Signed)
Made patient aware that her creatinine is mildly elevated and stable. She should follow-up with her primary provider per Dr. Truett Perna.   Labs will be routed to PCP.

## 2023-06-17 NOTE — Progress Notes (Signed)
Brittany Hensley   Diagnosis: Iron deficiency anemia  INTERVAL HISTORY:   Brittany Hensley returns as scheduled.  She underwent gastroduodenal anastomosis dilation by Dr. Ewing Schlein 05/05/2023 when she had increased dysphagia.  Dysphagia has improved.  She had rectal bleeding noscapine 11/12/2022.  A polyp was removed from the transverse colon.  He had hemorrhoids.  She was referred to Dr. Gerrit Friends she was noted to have grade 2 internal hemorrhoids.  A polyp was noted at the dentate line.  The polyp was excised and an internal hemorrhoid was banded.  The creatinine was elevated 05/10/2023.  A repeat chemistry panel 05/27/2023 found the creatinine at 1.05 with a BUN of 36.  Objective:  Vital signs in last 24 hours:  Blood pressure (!) 143/77, pulse 100, temperature 97.9 F (36.6 C), resp. rate 18, height 5\' 5"  (1.651 m), weight 100 lb 12.8 oz (45.7 kg), SpO2 97%.    Resp: Scattered inspiratory/expiratory rhonchi and wheeze, no respiratory distress Cardio: Regular rate and rhythm GI: Nontender, no hepatosplenomegaly, no mass Vascular: No leg edema  Lab Results:  Lab Results  Component Value Date   WBC 4.9 06/17/2023   HGB 12.7 06/17/2023   HCT 39.3 06/17/2023   MCV 100.5 (H) 06/17/2023   PLT 241 06/17/2023   NEUTROABS 2.9 06/17/2023    CMP  Lab Results  Component Value Date   NA 143 05/27/2023   K 4.7 05/27/2023   CL 111 (H) 05/27/2023   CO2 20 05/27/2023   GLUCOSE 102 (H) 05/27/2023   BUN 36 (H) 05/27/2023   CREATININE 1.05 (H) 05/27/2023   CALCIUM 9.6 05/27/2023   PROT 5.6 (L) 05/10/2023   ALBUMIN 3.8 05/10/2023   AST 18 05/10/2023   ALT 13 05/10/2023   ALKPHOS 67 05/10/2023   BILITOT <0.2 05/10/2023   GFRNONAA >60 04/14/2021   GFRAA 63 05/03/2020    No results found for: "CEA1", "CEA", "CAN199", "CA125"  Lab Results  Component Value Date   INR 1.17 06/12/2018   LABPROT 14.8 06/12/2018    Imaging:  No results found.  Medications: I  have reviewed the patient's current medications.   Assessment/Plan: Chronic anemia secondary to gastrointestinal blood loss and iron deficiency, she last received IV iron for 3 weekly treatments beginning 04/01/2023 History of kidney stones, followed by Dr. Wilson Singer. Gram-negative sepsis and septic shock syndrome April 2008. History of acute renal failure secondary to obstruction and septic shock. History of adrenal insufficiency, no longer on hormone replacement.  Questionable allergic to Venofer in June 2010. She reported an elevated blood pressure and "dizziness" on the day following the Venofer therapy. She has tolerated iron dextran without an apparent reaction. History of a left forearm fracture.   Intermittent low-serum bicarbonate level, ? related to diarrhea or renal insufficiency Dysphagia, improved with erythromycin and esophageal dilatation procedures , status post dilatation of the gastric jejunal anastomosis 08/10/2014,02/15/2015, 11/12/2015, 08/10/2016, 05/20/2017 Chest x-ray 05/21/2017-COPD changes with questionable nodular density inferior left chest. CT chest scheduled 06/07/2017. Right hip arthroplasty May 2019, left hip arthroplasty July 2019 COVID-19 infection January 2021 Osteoporosis Removal of dentate line polyp and banding of internal hemorrhoids 01/27/2023     Disposition: Brittany Hensley appears stable.  The ferritin level and hemoglobin are normal.  She would like to continue every 79-month follow-up of the hemoglobin and ferritin.  She will return for an office visit in 1 year. She continues follow-up with Dr. Ewing Schlein for management of the gastroduodenal stricture.  She requested we check  a chemistry panel to follow-up on the creatinine.  Thornton Papas, MD  06/17/2023  12:15 PM

## 2023-07-12 DIAGNOSIS — L8990 Pressure ulcer of unspecified site, unspecified stage: Secondary | ICD-10-CM | POA: Diagnosis not present

## 2023-07-12 DIAGNOSIS — L821 Other seborrheic keratosis: Secondary | ICD-10-CM | POA: Diagnosis not present

## 2023-07-12 DIAGNOSIS — Z85828 Personal history of other malignant neoplasm of skin: Secondary | ICD-10-CM | POA: Diagnosis not present

## 2023-07-12 DIAGNOSIS — Z08 Encounter for follow-up examination after completed treatment for malignant neoplasm: Secondary | ICD-10-CM | POA: Diagnosis not present

## 2023-07-12 DIAGNOSIS — X32XXXD Exposure to sunlight, subsequent encounter: Secondary | ICD-10-CM | POA: Diagnosis not present

## 2023-07-12 DIAGNOSIS — L218 Other seborrheic dermatitis: Secondary | ICD-10-CM | POA: Diagnosis not present

## 2023-07-12 DIAGNOSIS — L570 Actinic keratosis: Secondary | ICD-10-CM | POA: Diagnosis not present

## 2023-07-18 ENCOUNTER — Other Ambulatory Visit: Payer: Self-pay | Admitting: Nurse Practitioner

## 2023-07-18 DIAGNOSIS — D500 Iron deficiency anemia secondary to blood loss (chronic): Secondary | ICD-10-CM

## 2023-08-02 ENCOUNTER — Other Ambulatory Visit: Payer: Self-pay | Admitting: Nurse Practitioner

## 2023-09-14 ENCOUNTER — Other Ambulatory Visit: Payer: Self-pay | Admitting: Nurse Practitioner

## 2023-09-14 DIAGNOSIS — D500 Iron deficiency anemia secondary to blood loss (chronic): Secondary | ICD-10-CM

## 2023-09-17 ENCOUNTER — Inpatient Hospital Stay: Payer: PPO | Attending: Oncology

## 2023-09-17 ENCOUNTER — Other Ambulatory Visit: Payer: PPO

## 2023-09-17 DIAGNOSIS — D500 Iron deficiency anemia secondary to blood loss (chronic): Secondary | ICD-10-CM | POA: Insufficient documentation

## 2023-09-17 DIAGNOSIS — K922 Gastrointestinal hemorrhage, unspecified: Secondary | ICD-10-CM | POA: Insufficient documentation

## 2023-09-17 LAB — BASIC METABOLIC PANEL - CANCER CENTER ONLY
Anion gap: 6 (ref 5–15)
BUN: 26 mg/dL — ABNORMAL HIGH (ref 8–23)
CO2: 24 mmol/L (ref 22–32)
Calcium: 8.9 mg/dL (ref 8.9–10.3)
Chloride: 110 mmol/L (ref 98–111)
Creatinine: 1.02 mg/dL — ABNORMAL HIGH (ref 0.44–1.00)
GFR, Estimated: 58 mL/min — ABNORMAL LOW (ref >60)
Glucose, Bld: 92 mg/dL (ref 70–99)
Potassium: 4.2 mmol/L (ref 3.5–5.1)
Sodium: 140 mmol/L (ref 135–145)

## 2023-09-17 LAB — CBC WITH DIFFERENTIAL (CANCER CENTER ONLY)
Abs Immature Granulocytes: 0.01 10*3/uL (ref 0.00–0.07)
Basophils Absolute: 0 10*3/uL (ref 0.0–0.1)
Basophils Relative: 0 %
Eosinophils Absolute: 0.2 10*3/uL (ref 0.0–0.5)
Eosinophils Relative: 3 %
HCT: 39.6 % (ref 36.0–46.0)
Hemoglobin: 12.6 g/dL (ref 12.0–15.0)
Immature Granulocytes: 0 %
Lymphocytes Relative: 31 %
Lymphs Abs: 1.8 10*3/uL (ref 0.7–4.0)
MCH: 32.8 pg (ref 26.0–34.0)
MCHC: 31.8 g/dL (ref 30.0–36.0)
MCV: 103.1 fL — ABNORMAL HIGH (ref 80.0–100.0)
Monocytes Absolute: 0.5 10*3/uL (ref 0.1–1.0)
Monocytes Relative: 8 %
Neutro Abs: 3.5 10*3/uL (ref 1.7–7.7)
Neutrophils Relative %: 58 %
Platelet Count: 234 10*3/uL (ref 150–400)
RBC: 3.84 MIL/uL — ABNORMAL LOW (ref 3.87–5.11)
RDW: 13.3 % (ref 11.5–15.5)
WBC Count: 6 10*3/uL (ref 4.0–10.5)
nRBC: 0 % (ref 0.0–0.2)

## 2023-09-17 LAB — FERRITIN: Ferritin: 158 ng/mL (ref 11–307)

## 2023-09-20 ENCOUNTER — Telehealth: Payer: Self-pay | Admitting: *Deleted

## 2023-09-20 NOTE — Telephone Encounter (Signed)
[name] called requesting her recent lab results from 09/17/23. Reviewed with her and informed her renal test is improved. Hardcopy in mail as requested.

## 2023-10-12 ENCOUNTER — Other Ambulatory Visit: Payer: Self-pay | Admitting: Nurse Practitioner

## 2023-10-12 DIAGNOSIS — F411 Generalized anxiety disorder: Secondary | ICD-10-CM

## 2023-10-15 DIAGNOSIS — K6389 Other specified diseases of intestine: Secondary | ICD-10-CM | POA: Diagnosis not present

## 2023-10-15 DIAGNOSIS — R112 Nausea with vomiting, unspecified: Secondary | ICD-10-CM | POA: Diagnosis not present

## 2023-10-15 DIAGNOSIS — Z980 Intestinal bypass and anastomosis status: Secondary | ICD-10-CM | POA: Diagnosis not present

## 2023-10-15 DIAGNOSIS — K319 Disease of stomach and duodenum, unspecified: Secondary | ICD-10-CM | POA: Diagnosis not present

## 2023-10-15 DIAGNOSIS — K2970 Gastritis, unspecified, without bleeding: Secondary | ICD-10-CM | POA: Diagnosis not present

## 2023-10-15 DIAGNOSIS — K449 Diaphragmatic hernia without obstruction or gangrene: Secondary | ICD-10-CM | POA: Diagnosis not present

## 2023-10-21 DIAGNOSIS — K6389 Other specified diseases of intestine: Secondary | ICD-10-CM | POA: Diagnosis not present

## 2023-10-29 ENCOUNTER — Other Ambulatory Visit: Payer: Self-pay | Admitting: Nurse Practitioner

## 2023-10-29 ENCOUNTER — Encounter: Payer: Self-pay | Admitting: Nurse Practitioner

## 2023-11-05 ENCOUNTER — Ambulatory Visit: Payer: PPO | Admitting: Nurse Practitioner

## 2023-11-05 ENCOUNTER — Encounter: Payer: Self-pay | Admitting: Nurse Practitioner

## 2023-11-05 VITALS — BP 104/57 | HR 78 | Temp 97.8°F | Ht 65.0 in | Wt 99.0 lb

## 2023-11-05 DIAGNOSIS — M810 Age-related osteoporosis without current pathological fracture: Secondary | ICD-10-CM | POA: Diagnosis not present

## 2023-11-05 DIAGNOSIS — K219 Gastro-esophageal reflux disease without esophagitis: Secondary | ICD-10-CM

## 2023-11-05 DIAGNOSIS — K311 Adult hypertrophic pyloric stenosis: Secondary | ICD-10-CM | POA: Diagnosis not present

## 2023-11-05 DIAGNOSIS — Z681 Body mass index (BMI) 19 or less, adult: Secondary | ICD-10-CM

## 2023-11-05 DIAGNOSIS — E034 Atrophy of thyroid (acquired): Secondary | ICD-10-CM

## 2023-11-05 DIAGNOSIS — D500 Iron deficiency anemia secondary to blood loss (chronic): Secondary | ICD-10-CM | POA: Diagnosis not present

## 2023-11-05 DIAGNOSIS — F411 Generalized anxiety disorder: Secondary | ICD-10-CM

## 2023-11-05 DIAGNOSIS — L719 Rosacea, unspecified: Secondary | ICD-10-CM

## 2023-11-05 DIAGNOSIS — Z0001 Encounter for general adult medical examination with abnormal findings: Secondary | ICD-10-CM | POA: Diagnosis not present

## 2023-11-05 DIAGNOSIS — Z0000 Encounter for general adult medical examination without abnormal findings: Secondary | ICD-10-CM | POA: Diagnosis not present

## 2023-11-05 MED ORDER — ALPRAZOLAM 0.5 MG PO TABS: 0.5000 mg | ORAL_TABLET | Freq: Four times a day (QID) | ORAL | 5 refills | Status: DC | PRN

## 2023-11-05 MED ORDER — SERTRALINE HCL 100 MG PO TABS: ORAL_TABLET | ORAL | 1 refills | Status: DC

## 2023-11-05 MED ORDER — OMEPRAZOLE 40 MG PO CPDR: 40.0000 mg | DELAYED_RELEASE_CAPSULE | Freq: Every day | ORAL | 1 refills | Status: DC

## 2023-11-05 NOTE — Progress Notes (Signed)
 Subjective:    Patient ID: Brittany Hensley, female    DOB: November 18, 1950, 73 y.o.   MRN: [id number]   Chief Complaint: annual physical   HPI:  Brittany Hensley is a 73 y.o. who identifies as a female who was assigned female at birth.   Social history: Lives with: her daughter  lived with her Work history: disability   Comes in today for follow up of the following chronic medical issues:  1. Gastroesophageal reflux disease without esophagitis Is on omeprazole daily. Helps with reflux symptoms.  2. Gastric outlet obstruction Has poor appetite because her stomach does not empty properly. She had balloon stretching to gastro anastomosis.  3. Hypothyroidism due to acquired atrophy of thyroid No issues that she is aware of Lab Results  Component Value Date   TSH 0.142 (L) 05/10/2023     4. Iron deficiency anemia due to chronic blood loss Lab Results  Component Value Date   HGB 12.6 09/17/2023     5. Rosacea Has been under good control as of late  6. GAD (generalized anxiety disorder) Is on xanax QID     11/05/2023   10:15 AM 05/10/2023   11:31 AM 12/10/2022   10:42 AM 11/03/2022   11:35 AM  GAD 7 : Generalized Anxiety Score  Nervous, Anxious, on Edge 0 0 0 0  Control/stop worrying 0 0 0 0  Worry too much - different things 0 0 0 0  Trouble relaxing 0 0 0 0  Restless 0 0 0 0  Easily annoyed or irritable 0 0 0 0  Afraid - awful might happen 0 0 0 0  Total GAD 7 Score 0 0 0 0  Anxiety Difficulty Not difficult at all Not difficult at all Not difficult at all Not difficult at all       7. Osteoporosis without pathological fracture Last dexascan was done at 05/28/22.  Her t score was -5.3  8. BMI less than 19,adult No recent weight changes  Wt Readings from Last 3 Encounters:  11/05/23 99 lb (44.9 kg)  06/17/23 100 lb 12.8 oz (45.7 kg)  05/27/23 100 lb (45.4 kg)   BMI Readings from Last 3 Encounters:  11/05/23 16.47 kg/m  06/17/23 16.77 kg/m  05/27/23  16.64 kg/m       New complaints: None  Allergies  Allergen Reactions   Augmentin [Amoxicillin-Pot Clavulanate] Other (See Comments)    Bloody stool Did it involve swelling of the face/tongue/throat, SOB, or low BP? No Did it involve sudden or severe rash/hives, skin peeling, or any reaction on the inside of your mouth or nose? No Did you need to seek medical attention at a hospital or doctor's office? No When did it last happen?      1 year If all above answers are "NO", may proceed with cephalosporin use.    Famotidine Other (See Comments)    Fever    Klonopin [Clonazepam] Other (See Comments)    double vision   Other Other (See Comments)   Nitrofurantoin Rash   Reglan [Metoclopramide] Anxiety and Other (See Comments)    Causes confusion   Sulfamethoxazole-Trimethoprim Rash   Outpatient Encounter Medications as of 11/05/2023  Medication Sig   acetaminophen (TYLENOL) 500 MG tablet Take 500 mg by mouth 2 (two) times daily.   albuterol (VENTOLIN HFA) 108 (90 Base) MCG/ACT inhaler USE 2 PUFFS EVERY 6 HOURS AS NEEDED   ALPRAZolam (XANAX) 0.5 MG tablet Take 1 tablet (0.5 mg total) by mouth  4 (four) times daily as needed for anxiety.   Ascorbic Acid (VITAMIN C) 1000 MG tablet Take 1,000 mg by mouth daily.   Biotin 10 MG CAPS Take 10 mg by mouth daily.   BLACK ELDERBERRY PO Take 50 mg by mouth daily.   calcium citrate (CALCITRATE - DOSED IN MG ELEMENTAL CALCIUM) 950 (200 Ca) MG tablet Take 2 tablets by mouth 2 (two) times daily.   Cholecalciferol (VITAMIN D) 2000 units tablet Take 2,000 Units by mouth 2 (two) times daily.   diclofenac Sodium (VOLTAREN) 1 % GEL Apply 4 g topically daily as needed (Hip pain). (Patient not taking: Reported on 06/17/2023)   docusate sodium (COLACE) 100 MG capsule Take 1 capsule (100 mg total) by mouth 2 (two) times daily.   folic acid (FOLVITE) 800 MCG tablet Take 1 tablet (800 mcg total) by mouth daily.   levothyroxine (SYNTHROID) 88 MCG tablet Take 1  tablet (88 mcg total) by mouth daily. **NEEDS TO BE SEEN BEFORE NEXT REFILL**   loratadine (CLARITIN) 10 MG tablet TAKE ONE (1) TABLET EACH DAY   MAGNESIUM CITRATE PO Take 500 mg by mouth 2 (two) times daily.   meclizine (ANTIVERT) 12.5 MG tablet Take 12.5 mg by mouth 3 (three) times daily as needed for dizziness. (Patient not taking: Reported on 06/17/2023)   Multiple Vitamins-Minerals (CENTRUM SILVER) CHEW Chew 1 tablet by mouth 2 (two) times daily.   omeprazole (PRILOSEC) 40 MG capsule Take 1 capsule (40 mg total) by mouth daily.   ondansetron (ZOFRAN-ODT) 4 MG disintegrating tablet Take 1 tablet (4 mg total) by mouth 2 (two) times daily.   Polyethyl Glycol-Propyl Glycol (SYSTANE OP) Place 1 drop into both eyes daily as needed (dry eyes).   Probiotic Product (PROBIOTIC PO) Take 1 capsule by mouth daily.    sertraline (ZOLOFT) 100 MG tablet TAKE TWO (2) TABLETS BY MOUTH DAILY   sucralfate (CARAFATE) 1 g tablet TAKE 1 TABLET 4 TIMES DAILY WITH MEALS AND AT BEDTIME   Turmeric Curcumin 500 MG CAPS Take 500 mg by mouth daily.   No facility-administered encounter medications on file as of 11/05/2023.    Past Surgical History:  Procedure Laterality Date   APPENDECTOMY     BALLOON DILATION  06/16/2011   Procedure: BALLOON DILATION;  Surgeon: Petra Kuba, MD;  Location: [facility] ENDOSCOPY;  Service: Endoscopy;  Laterality: N/A;   BALLOON DILATION N/A 10/03/2013   Procedure: BALLOON DILATION;  Surgeon: Petra Kuba, MD;  Location: [facility] ENDOSCOPY;  Service: Endoscopy;  Laterality: N/A;   BALLOON DILATION N/A 08/10/2014   Procedure: BALLOON DILATION;  Surgeon: Petra Kuba, MD;  Location: [facility];  Service: Endoscopy;  Laterality: N/A;  from 12 - 15 cm dilation completed   BALLOON DILATION N/A 02/15/2015   Procedure: BALLOON DILATION;  Surgeon: Vida Rigger, MD;  Location: [facility];  Service: Endoscopy;  Laterality: N/A;   BALLOON DILATION N/A 11/12/2015   Procedure: BALLOON DILATION;  Surgeon: Vida Rigger, MD;  Location: [facility];  Service: Endoscopy;  Laterality: N/A;   BALLOON DILATION N/A 08/10/2016   Procedure: BALLOON DILATION;  Surgeon: Vida Rigger, MD;  Location: [facility] ENDOSCOPY;  Service: Endoscopy;  Laterality: N/A;   BALLOON DILATION N/A 08/07/2016   Procedure: BALLOON DILATION;  Surgeon: Vida Rigger, MD;  Location: [facility] ENDOSCOPY;  Service: Endoscopy;  Laterality: N/A;   BALLOON DILATION N/A 05/20/2017   Procedure: BALLOON DILATION;  Surgeon: Vida Rigger, MD;  Location: [facility];  Service: Endoscopy;  Laterality: N/A;  BALLOON DILATION N/A 12/02/2017   Procedure: BALLOON DILATION;  Surgeon: Vida Rigger, MD;  Location: [facility] ENDOSCOPY;  Service: Endoscopy;  Laterality: N/A;   BALLOON DILATION N/A 07/19/2018   Procedure: BALLOON DILATION;  Surgeon: Vida Rigger, MD;  Location: [facility];  Service: Endoscopy;  Laterality: N/A;   BALLOON DILATION N/A 01/23/2019   Procedure: BALLOON DILATION;  Surgeon: Vida Rigger, MD;  Location: [facility];  Service: Endoscopy;  Laterality: N/A;   BALLOON DILATION N/A 07/13/2019   Procedure: BALLOON DILATION;  Surgeon: Vida Rigger, MD;  Location: [facility];  Service: Endoscopy;  Laterality: N/A;   BALLOON DILATION N/A 02/02/2020   Procedure: BALLOON DILATION;  Surgeon: Vida Rigger, MD;  Location: [facility];  Service: Endoscopy;  Laterality: N/A;   BALLOON DILATION N/A 07/16/2020   Procedure: BALLOON DILATION;  Surgeon: Vida Rigger, MD;  Location: [facility];  Service: Endoscopy;  Laterality: N/A;   BALLOON DILATION N/A 02/19/2021   Procedure: BALLOON DILATION;  Surgeon: Vida Rigger, MD;  Location: [facility];  Service: Endoscopy;  Laterality: N/A;   BALLOON DILATION N/A 09/30/2021   Procedure: BALLOON DILATION;  Surgeon: Vida Rigger, MD;  Location: [facility];  Service: Endoscopy;  Laterality: N/A;   BALLOON DILATION N/A 03/19/2022   Procedure: BALLOON DILATION;  Surgeon: Vida Rigger, MD;  Location: [facility];  Service:  Gastroenterology;  Laterality: N/A;   BALLOON DILATION N/A 05/05/2023   Procedure: BALLOON DILATION;  Surgeon: Vida Rigger, MD;  Location: [facility];  Service: Gastroenterology;  Laterality: N/A;   CATARACT EXTRACTION, BILATERAL     2019   CHOLECYSTECTOMY OPEN  1979   COLONOSCOPY     Hx: of   CYSTOSCOPY/URETEROSCOPY/HOLMIUM LASER/STENT PLACEMENT Bilateral 05/06/2021   Procedure: CYSTOSCOPY BILATERAL RETROGRADE LEFT URETEROSCOPY/HOLMIUM LASER/STENT PLACEMENT;  Surgeon: Bjorn Pippin, MD;  Location: [facility];  Service: Urology;  Laterality: Bilateral;   ESOPHAGOGASTRODUODENOSCOPY  06/16/2011   Procedure: ESOPHAGOGASTRODUODENOSCOPY (EGD);  Surgeon: Petra Kuba, MD;  Location: [facility] ENDOSCOPY;  Service: Endoscopy;  Laterality: N/A;   ESOPHAGOGASTRODUODENOSCOPY N/A 10/03/2013   Procedure: ESOPHAGOGASTRODUODENOSCOPY (EGD);  Surgeon: Petra Kuba, MD;  Location: [facility] ENDOSCOPY;  Service: Endoscopy;  Laterality: N/A;   ESOPHAGOGASTRODUODENOSCOPY N/A 08/10/2014   Procedure: ESOPHAGOGASTRODUODENOSCOPY (EGD);  Surgeon: Petra Kuba, MD;  Location: [facility] ENDOSCOPY;  Service: Endoscopy;  Laterality: N/A;   ESOPHAGOGASTRODUODENOSCOPY N/A 08/10/2016   Procedure: ESOPHAGOGASTRODUODENOSCOPY (EGD);  Surgeon: Vida Rigger, MD;  Location: [facility]COPY;  Service: Endoscopy;  Laterality: N/A;   ESOPHAGOGASTRODUODENOSCOPY N/A 07/19/2018   Procedure: ESOPHAGOGASTRODUODENOSCOPY (EGD);  Surgeon: Vida Rigger, MD;  Location: [facility] ENDOSCOPY;  Service: Endoscopy;  Laterality: N/A;   ESOPHAGOGASTRODUODENOSCOPY (EGD) WITH PROPOFOL N/A 02/15/2015   Procedure: ESOPHAGOGASTRODUODENOSCOPY (EGD) WITH PROPOFOL;  Surgeon: Vida Rigger, MD;  Location: [facility] ENDOSCOPY;  Service: Endoscopy;  Laterality: N/A;   ESOPHAGOGASTRODUODENOSCOPY (EGD) WITH PROPOFOL N/A 11/12/2015   Procedure: ESOPHAGOGASTRODUODENOSCOPY (EGD) WITH PROPOFOL;  Surgeon: Vida Rigger, MD;  Location: [facility] ENDOS[facility];  Service: Endoscopy;  Laterality: N/A;    ESOPHAGOGASTRODUODENOSCOPY (EGD) WITH PROPOFOL N/A 08/07/2016   Procedure: ESOPHAGOGASTRODUODENOSCOPY (EGD) WITH PROPOFOL;  Surgeon: Vida Rigger, MD;  Location: [facility] ENDOSCOPY;  Service: Endoscopy;  Laterality: N/A;   ESOPHAGOGASTRODUODENOSCOPY (EGD) WITH PROPOFOL N/A 05/20/2017   Procedure: ESOPHAGOGASTRODUODENOSCOPY (EGD) WITH PROPOFOL;  Surgeon: Vida Rigger, MD;  Location: [facility];  Service: Endoscopy;  Laterality: N/A;   ESOPHAGOGASTRODUODENOSCOPY (EGD) WITH PROPOFOL N/A 12/02/2017   Procedure: ESOPHAGOGASTRODUODENOSCOPY (EGD) WITH PROPOFOL;  Surgeon: Vida Rigger, MD;  Location: [facility] ENDOSCOPY;  Service: Endoscopy;  Laterality: N/A;  have c  arm available   ESOPHAGOGASTRODUODENOSCOPY (EGD) WITH PROPOFOL N/A 01/23/2019   Procedure: ESOPHAGOGASTRODUODENOSCOPY (EGD) WITH PROPOFOL;  Surgeon: Vida Rigger, MD;  Location: [facility];  Service: Endoscopy;  Laterality: N/A;   ESOPHAGOGASTRODUODENOSCOPY (EGD) WITH PROPOFOL N/A 07/13/2019   Procedure: ESOPHAGOGASTRODUODENOSCOPY (EGD) WITH PROPOFOL;  Surgeon: Vida Rigger, MD;  Location: [facility] ENDOS[facility];  Service: Endoscopy;  Laterality: N/A;   ESOPHAGOGASTRODUODENOSCOPY (EGD) WITH PROPOFOL N/A 02/02/2020   Procedure: ESOPHAGOGASTRODUODENOSCOPY (EGD) WITH PROPOFOL;  Surgeon: Vida Rigger, MD;  Location: [facility] ENDOS[facility];  Service: Endoscopy;  Laterality: N/A;   ESOPHAGOGASTRODUODENOSCOPY (EGD) WITH PROPOFOL N/A 07/16/2020   Procedure: ESOPHAGOGASTRODUODENOSCOPY (EGD) WITH PROPOFOL;  Surgeon: Vida Rigger, MD;  Location: [facility] ENDOS[facility];  Service: Endoscopy;  Laterality: N/A;   ESOPHAGOGASTRODUODENOSCOPY (EGD) WITH PROPOFOL N/A 02/19/2021   Procedure: ESOPHAGOGASTRODUODENOSCOPY (EGD) WITH PROPOFOL;  Surgeon: Vida Rigger, MD;  Location: [facility] END[facility];  Service: Endoscopy;  Laterality: N/A;  with possible botox   ESOPHAGOGASTRODUODENOSCOPY (EGD) WITH PROPOFOL N/A 09/30/2021   Procedure: ESOPHAGOGASTRODUODENOSCOPY (EGD) WITH PROPOFOL;  Surgeon: Vida Rigger, MD;  Location: [facility]  ENDOSCOPY;  Service: Endoscopy;  Laterality: N/A;   ESOPHAGOGASTRODUODENOSCOPY (EGD) WITH PROPOFOL N/A 03/19/2022   Procedure: ESOPHAGOGASTRODUODENOSCOPY (EGD) WITH PROPOFOL;  Surgeon: Vida Rigger, MD;  Location: [facility] ENDOSCOPY;  Service: Gastroenterology;  Laterality: N/A;   ESOPHAGOGASTRODUODENOSCOPY (EGD) WITH PROPOFOL N/A 05/05/2023   Procedure: ESOPHAGOGASTRODUODENOSCOPY (EGD) WITH PROPOFOL;  Surgeon: Vida Rigger, MD;  Location: [facility] ENDOSCOPY;  Service: Gastroenterology;  Laterality: N/A;  with balloon dilation   FOREIGN BODY REMOVAL  07/19/2018   Procedure: FOREIGN BODY REMOVAL;  Surgeon: Vida Rigger, MD;  Location: [facility] ENDOSCOPY;  Service: Endoscopy;;   FRACTURE SURGERY     Hx: of left wrist 01/2021   GASTRECTOMY  [id number]   GASTRECTOMY N/A    X3   LITHOTRIPSY     Hx; of for kidney stones [id number]   SAVORY DILATION N/A 08/10/2014   Procedure: SAVORY DILATION;  Surgeon: Petra Kuba, MD;  Location: [facility] ENDOSCOPY;  Service: Endoscopy;  Laterality: N/A;   TOTAL HIP ARTHROPLASTY Right 12/30/2017   Procedure: RIGHT TOTAL HIP ARTHROPLASTY;  Surgeon: Vickki Hearing, MD;  Location: [facility];  Service: Orthopedics;  Laterality: Right;   TOTAL HIP ARTHROPLASTY Left 02/17/2018   Procedure: TOTAL HIP ARTHROPLASTY;  Surgeon: Vickki Hearing, MD;  Location: [facility];  Service: Orthopedics;  Laterality: Left;   TUBAL LIGATION     1975    Family History  Problem Relation Age of Onset   Diabetes Mother    Cancer - Prostate Brother       Controlled substance contract: 05/29/22     Review of Systems  Constitutional:  Negative for diaphoresis.  Eyes:  Negative for pain.  Respiratory:  Negative for shortness of breath.   Cardiovascular:  Negative for chest pain, palpitations and leg swelling.  Gastrointestinal:  Negative for abdominal pain.  Endocrine: Negative for polydipsia.  Skin:  Negative for rash.  Neurological:  Negative for dizziness, weakness and headaches.  Hematological:   Does not bruise/bleed easily.  All other systems reviewed and are negative.      Objective:   Physical Exam Vitals and nursing note reviewed.  Constitutional:      General: She is not in acute distress.    Appearance: Normal appearance. She is well-developed.  HENT:     Head: Normocephalic.     Right Ear: Tympanic membrane normal.     Left Ear: Tympanic membrane normal.     Nose: Nose normal.     Mouth/Throat:  Mouth: Mucous membranes are moist.  Eyes:     Pupils: Pupils are equal, round, and reactive to light.  Neck:     Vascular: No carotid bruit or JVD.  Cardiovascular:     Rate and Rhythm: Normal rate and regular rhythm.     Heart sounds: Normal heart sounds.  Pulmonary:     Effort: Pulmonary effort is normal. No respiratory distress.     Breath sounds: Normal breath sounds. No wheezing or rales.  Chest:     Chest wall: No tenderness.  Abdominal:     General: Bowel sounds are normal. There is no distension or abdominal bruit.     Palpations: Abdomen is soft. There is no hepatomegaly, splenomegaly, mass or pulsatile mass.     Tenderness: There is no abdominal tenderness.  Musculoskeletal:        General: Normal range of motion.     Cervical back: Normal range of motion and neck supple.  Lymphadenopathy:     Cervical: No cervical adenopathy.  Skin:    General: Skin is warm and dry.  Neurological:     Mental Status: She is alert and oriented to person, place, and time.     Deep Tendon Reflexes: Reflexes are normal and symmetric.  Psychiatric:        Behavior: Behavior normal.        Thought Content: Thought content normal.        Judgment: Judgment normal.    BP (!) 104/57   Pulse 78   Temp 97.8 F (36.6 C) (Temporal)   Ht 5\' 5"  (1.651 m)   Wt 99 lb (44.9 kg)   SpO2 97%   BMI 16.47 kg/m           Assessment & Plan:   Brittany Hensley comes in today with chief complaint of medical management of chronic issues    Diagnosis and orders  addressed:  1. Gastroesophageal reflux disease without esophagitis Avoid spicy foods Do not eat 2 hours prior to bedtime - omeprazole (PRILOSEC) 40 MG capsule; Take 1 capsule (40 mg total) by mouth daily.  Dispense: 90 capsule; Refill: 1  2. Gastric outlet obstruction Keep follow  up with Dr. Ewing Schlein tomorrow  3. Hypothyroidism due to acquired atrophy of thyroid Labs pending - levothyroxine (SYNTHROID) 100 MCG tablet; Take 1 tablet (100 mcg total) by mouth daily.  Dispense: 90 tablet; Refill: 3  4. Iron deficiency anemia due to chronic blood loss - folic acid (FOLVITE) 800 MCG tablet; Take 1 tablet (800 mcg total) by mouth daily.  Dispense: 90 tablet; Refill: 1 - sucralfate (CARAFATE) 1 g tablet; Take 1 tablet (1 g total) by mouth 4 (four) times daily -  with meals and at bedtime.  Dispense: 120 tablet; Refill: 1 - Ferritin  5. Rosacea  6. GAD (generalized anxiety disorder) Stress management - ALPRAZolam (XANAX) 0.5 MG tablet; Take 1 tablet (0.5 mg total) by mouth 4 (four) times daily as needed for anxiety.  Dispense: 120 tablet; Refill: 5 - sertraline (ZOLOFT) 100 MG tablet; TAKE TWO (2) TABLETS BY MOUTH DAILY  Dispense: 180 tablet; Refill: 1  7. Osteoporosis without pathological fracture Weight a exercises  8. BMI less than 19,adult Discussed diet and exercise for person with BMI >25 Will recheck weight in 3-6 months   Labs pending Health Maintenance reviewed Diet and exercise encouraged  Follow up plan: 6 months   Mary-Margaret Daphine Deutscher, FNP

## 2023-11-06 LAB — CMP14+EGFR
ALT: 19 IU/L (ref 0–32)
AST: 22 IU/L (ref 0–40)
Albumin: 4.1 g/dL (ref 3.8–4.8)
Alkaline Phosphatase: 85 IU/L (ref 44–121)
BUN/Creatinine Ratio: 35 — ABNORMAL HIGH (ref 12–28)
BUN: 42 mg/dL — ABNORMAL HIGH (ref 8–27)
Bilirubin Total: 0.2 mg/dL (ref 0.0–1.2)
CO2: 19 mmol/L — ABNORMAL LOW (ref 20–29)
Calcium: 9.1 mg/dL (ref 8.7–10.3)
Chloride: 108 mmol/L — ABNORMAL HIGH (ref 96–106)
Creatinine, Ser: 1.21 mg/dL — ABNORMAL HIGH (ref 0.57–1.00)
Globulin, Total: 1.7 g/dL (ref 1.5–4.5)
Glucose: 96 mg/dL (ref 70–99)
Potassium: 4.5 mmol/L (ref 3.5–5.2)
Sodium: 140 mmol/L (ref 134–144)
Total Protein: 5.8 g/dL — ABNORMAL LOW (ref 6.0–8.5)
eGFR: 47 mL/min/{1.73_m2} — ABNORMAL LOW (ref >59)

## 2023-11-06 LAB — LIPID PANEL
Chol/HDL Ratio: 3 ratio (ref 0.0–4.4)
Cholesterol, Total: 155 mg/dL (ref 100–199)
HDL: 51 mg/dL (ref >39)
LDL Chol Calc (NIH): 83 mg/dL (ref 0–99)
Triglycerides: 115 mg/dL (ref 0–149)
VLDL Cholesterol Cal: 21 mg/dL (ref 5–40)

## 2023-11-06 LAB — CBC WITH DIFFERENTIAL/PLATELET
Basophils Absolute: 0 10*3/uL (ref 0.0–0.2)
Basos: 1 %
EOS (ABSOLUTE): 0.2 10*3/uL (ref 0.0–0.4)
Eos: 4 %
Hematocrit: 40.5 % (ref 34.0–46.6)
Hemoglobin: 13 g/dL (ref 11.1–15.9)
Immature Grans (Abs): 0 10*3/uL (ref 0.0–0.1)
Immature Granulocytes: 0 %
Lymphocytes Absolute: 1.9 10*3/uL (ref 0.7–3.1)
Lymphs: 34 %
MCH: 32.3 pg (ref 26.6–33.0)
MCHC: 32.1 g/dL (ref 31.5–35.7)
MCV: 101 fL — ABNORMAL HIGH (ref 79–97)
Monocytes Absolute: 0.6 10*3/uL (ref 0.1–0.9)
Monocytes: 12 %
Neutrophils Absolute: 2.7 10*3/uL (ref 1.4–7.0)
Neutrophils: 49 %
Platelets: 255 10*3/uL (ref 150–450)
RBC: 4.02 x10E6/uL (ref 3.77–5.28)
RDW: 12.3 % (ref 11.7–15.4)
WBC: 5.4 10*3/uL (ref 3.4–10.8)

## 2023-11-06 LAB — THYROID PANEL WITH TSH
Free Thyroxine Index: 1.4 (ref 1.2–4.9)
T3 Uptake Ratio: 31 % (ref 24–39)
T4, Total: 4.5 ug/dL (ref 4.5–12.0)
TSH: 2.84 u[IU]/mL (ref 0.450–4.500)

## 2023-11-06 LAB — VITAMIN D 25 HYDROXY (VIT D DEFICIENCY, FRACTURES): Vit D, 25-Hydroxy: 70.7 ng/mL (ref 30.0–100.0)

## 2023-11-09 ENCOUNTER — Telehealth: Payer: Self-pay

## 2023-11-09 NOTE — Telephone Encounter (Signed)
 Copied from CRM [id number]. Topic: General - Other >> Nov 09, 2023  2:32 PM [name] wrote: Reason for CRM: Pt calling for lab results, will wait for call back from nurse.

## 2023-11-14 ENCOUNTER — Other Ambulatory Visit: Payer: Self-pay | Admitting: Nurse Practitioner

## 2023-11-14 DIAGNOSIS — D500 Iron deficiency anemia secondary to blood loss (chronic): Secondary | ICD-10-CM

## 2023-11-28 ENCOUNTER — Other Ambulatory Visit: Payer: Self-pay | Admitting: Nurse Practitioner

## 2023-12-08 ENCOUNTER — Telehealth: Payer: Self-pay | Admitting: *Deleted

## 2023-12-08 DIAGNOSIS — D500 Iron deficiency anemia secondary to blood loss (chronic): Secondary | ICD-10-CM

## 2023-12-08 NOTE — Telephone Encounter (Addendum)
 Asking if Dr. Scherrie Curt is willing to add renal functions to her labs scheduled on 5/14? Has been having elevation in her creatinine recently. Informed Cornelius Dill that MD agrees to add CMP

## 2023-12-12 ENCOUNTER — Other Ambulatory Visit: Payer: Self-pay | Admitting: Nurse Practitioner

## 2023-12-15 ENCOUNTER — Inpatient Hospital Stay: Payer: Self-pay | Attending: Oncology

## 2023-12-15 ENCOUNTER — Other Ambulatory Visit: Payer: PPO

## 2023-12-15 ENCOUNTER — Ambulatory Visit: Payer: Self-pay | Admitting: Oncology

## 2023-12-15 DIAGNOSIS — D500 Iron deficiency anemia secondary to blood loss (chronic): Secondary | ICD-10-CM | POA: Diagnosis not present

## 2023-12-15 LAB — CBC WITH DIFFERENTIAL (CANCER CENTER ONLY)
Abs Immature Granulocytes: 0.01 10*3/uL (ref 0.00–0.07)
Basophils Absolute: 0 10*3/uL (ref 0.0–0.1)
Basophils Relative: 1 %
Eosinophils Absolute: 0.2 10*3/uL (ref 0.0–0.5)
Eosinophils Relative: 3 %
HCT: 38.6 % (ref 36.0–46.0)
Hemoglobin: 12.5 g/dL (ref 12.0–15.0)
Immature Granulocytes: 0 %
Lymphocytes Relative: 33 %
Lymphs Abs: 1.8 10*3/uL (ref 0.7–4.0)
MCH: 33.3 pg (ref 26.0–34.0)
MCHC: 32.4 g/dL (ref 30.0–36.0)
MCV: 102.9 fL — ABNORMAL HIGH (ref 80.0–100.0)
Monocytes Absolute: 0.6 10*3/uL (ref 0.1–1.0)
Monocytes Relative: 10 %
Neutro Abs: 3 10*3/uL (ref 1.7–7.7)
Neutrophils Relative %: 53 %
Platelet Count: 221 10*3/uL (ref 150–400)
RBC: 3.75 MIL/uL — ABNORMAL LOW (ref 3.87–5.11)
RDW: 12.9 % (ref 11.5–15.5)
WBC Count: 5.6 10*3/uL (ref 4.0–10.5)
nRBC: 0 % (ref 0.0–0.2)

## 2023-12-15 LAB — CMP (CANCER CENTER ONLY)
ALT: 27 U/L (ref 0–44)
AST: 28 U/L (ref 15–41)
Albumin: 3.9 g/dL (ref 3.5–5.0)
Alkaline Phosphatase: 76 U/L (ref 38–126)
Anion gap: 9 (ref 5–15)
BUN: 35 mg/dL — ABNORMAL HIGH (ref 8–23)
CO2: 22 mmol/L (ref 22–32)
Calcium: 9.4 mg/dL (ref 8.9–10.3)
Chloride: 111 mmol/L (ref 98–111)
Creatinine: 1.19 mg/dL — ABNORMAL HIGH (ref 0.44–1.00)
GFR, Estimated: 48 mL/min — ABNORMAL LOW (ref >60)
Glucose, Bld: 123 mg/dL — ABNORMAL HIGH (ref 70–99)
Potassium: 4.3 mmol/L (ref 3.5–5.1)
Sodium: 142 mmol/L (ref 135–145)
Total Bilirubin: <0.2 mg/dL (ref 0.0–1.2)
Total Protein: 6 g/dL — ABNORMAL LOW (ref 6.5–8.1)

## 2023-12-15 LAB — FERRITIN: Ferritin: 152 ng/mL (ref 11–307)

## 2023-12-15 NOTE — Telephone Encounter (Signed)
 Notified of normal ferritin and CBC and CMP results. She requested copy of labs be mailed to her and CMP forwarded to PCP. CMP routed to Mary-Margaret Gaylyn Keas, FNP and lab mailed to home.

## 2023-12-28 ENCOUNTER — Encounter: Payer: Self-pay | Admitting: Nurse Practitioner

## 2024-01-07 ENCOUNTER — Ambulatory Visit

## 2024-01-09 ENCOUNTER — Other Ambulatory Visit: Payer: Self-pay | Admitting: Nurse Practitioner

## 2024-01-21 DIAGNOSIS — Z681 Body mass index (BMI) 19 or less, adult: Secondary | ICD-10-CM | POA: Diagnosis not present

## 2024-01-21 DIAGNOSIS — J019 Acute sinusitis, unspecified: Secondary | ICD-10-CM | POA: Diagnosis not present

## 2024-01-21 DIAGNOSIS — J029 Acute pharyngitis, unspecified: Secondary | ICD-10-CM | POA: Diagnosis not present

## 2024-01-21 DIAGNOSIS — R051 Acute cough: Secondary | ICD-10-CM | POA: Diagnosis not present

## 2024-02-02 DIAGNOSIS — J019 Acute sinusitis, unspecified: Secondary | ICD-10-CM | POA: Diagnosis not present

## 2024-02-07 DIAGNOSIS — R0981 Nasal congestion: Secondary | ICD-10-CM | POA: Diagnosis not present

## 2024-02-07 DIAGNOSIS — Z681 Body mass index (BMI) 19 or less, adult: Secondary | ICD-10-CM | POA: Diagnosis not present

## 2024-02-17 ENCOUNTER — Ambulatory Visit: Admitting: Orthopedic Surgery

## 2024-02-17 ENCOUNTER — Other Ambulatory Visit (INDEPENDENT_AMBULATORY_CARE_PROVIDER_SITE_OTHER): Payer: Self-pay

## 2024-02-17 ENCOUNTER — Telehealth: Payer: Self-pay | Admitting: Orthopedic Surgery

## 2024-02-17 VITALS — BP 123/69 | HR 77 | Ht 65.0 in | Wt 105.0 lb

## 2024-02-17 DIAGNOSIS — M79601 Pain in right arm: Secondary | ICD-10-CM | POA: Diagnosis not present

## 2024-02-17 DIAGNOSIS — M4722 Other spondylosis with radiculopathy, cervical region: Secondary | ICD-10-CM

## 2024-02-17 NOTE — Telephone Encounter (Signed)
 She has been doing some upper extremity exercises with weights Do you want her to continue those?

## 2024-02-17 NOTE — Progress Notes (Signed)
  Intake history:  Ht 5' 5 (1.651 m)   Wt 105 lb (47.6 kg)   BMI 17.47 kg/m  Body mass index is 17.47 kg/m.    WHAT ARE WE SEEING YOU FOR TODAY?   right arm(s)  How long has this bothered you? (DOI?DOS?WS?)  3-4 month(s) ago  Anticoag.  No  Diabetes No  Heart disease No  Hypertension No  SMOKING HX No  Kidney disease No  Any ALLERGIES ______________________________________________   Treatment:  Have you taken:  Tylenol  Yes  Advil No  Had PT No  Had injection No  Other  _________________________

## 2024-02-17 NOTE — Progress Notes (Signed)
 Chief Complaint  Patient presents with   Arm Pain    R arm for mos no better or worse. No strength loss but limited ROM    Brittany Hensley is 73 year old female reports no trauma to her right upper extremity but complains of pain in her right arm.  This goes from her shoulder down into her hand.  She says at times she has to pick her arm up with her other hand and then at times she has.  Elevation  No history of prior surgery in the right upper extremity.  Denies any neck pain  Review of systems no evidence of numbness or tingling in the right upper extremity just pain and weakness  Cervical spine examination shows no tenderness over the bony prominences of the neck but she does have decreased rotation right left and lateral bend  Spurling sign negative  She does have full forward elevation of the shoulder has good strength to manual muscle testing  Imaging shows proximal migration of the humerus on the right without degenerative changes  She has subluxation in the cervical spine with extensive cervical spondylosis  Images were reviewed with the patient  Recommend consult with spine to evaluate and manage ongoing right upper extremity pain  Encounter Diagnoses  Name Primary?   Arm pain, right Yes   Other spondylosis with radiculopathy, cervical region

## 2024-02-17 NOTE — Telephone Encounter (Signed)
 I called her to advise,  She voiced understanding

## 2024-02-17 NOTE — Telephone Encounter (Signed)
 DR. MARGRETTE   Patient wants to know if she should still do her exercises.  Please call her back at [phone]

## 2024-02-23 DIAGNOSIS — Z980 Intestinal bypass and anastomosis status: Secondary | ICD-10-CM | POA: Diagnosis not present

## 2024-02-23 DIAGNOSIS — K222 Esophageal obstruction: Secondary | ICD-10-CM | POA: Diagnosis not present

## 2024-03-08 ENCOUNTER — Telehealth: Payer: Self-pay | Admitting: *Deleted

## 2024-03-08 ENCOUNTER — Telehealth: Payer: Self-pay | Admitting: Oncology

## 2024-03-08 DIAGNOSIS — D500 Iron deficiency anemia secondary to blood loss (chronic): Secondary | ICD-10-CM

## 2024-03-08 NOTE — Telephone Encounter (Signed)
 Called to request renal functions be checked with her 8/20 lab appointment here to forward to her PCP. Lab added as requested.

## 2024-03-08 NOTE — Telephone Encounter (Signed)
 PT called to reschedule lab appt, and speak with Dr.Sherrill's nurse.

## 2024-03-15 ENCOUNTER — Other Ambulatory Visit: Payer: Self-pay | Admitting: Nurse Practitioner

## 2024-03-16 ENCOUNTER — Other Ambulatory Visit: Payer: Self-pay

## 2024-03-16 ENCOUNTER — Other Ambulatory Visit: Payer: PPO

## 2024-03-22 ENCOUNTER — Other Ambulatory Visit (INDEPENDENT_AMBULATORY_CARE_PROVIDER_SITE_OTHER): Payer: Self-pay

## 2024-03-22 ENCOUNTER — Telehealth: Payer: Self-pay | Admitting: *Deleted

## 2024-03-22 ENCOUNTER — Inpatient Hospital Stay: Attending: Oncology

## 2024-03-22 ENCOUNTER — Ambulatory Visit: Admitting: Orthopedic Surgery

## 2024-03-22 VITALS — BP 122/75 | HR 80 | Ht 65.0 in | Wt 105.0 lb

## 2024-03-22 DIAGNOSIS — M542 Cervicalgia: Secondary | ICD-10-CM | POA: Diagnosis not present

## 2024-03-22 DIAGNOSIS — D500 Iron deficiency anemia secondary to blood loss (chronic): Secondary | ICD-10-CM | POA: Diagnosis not present

## 2024-03-22 LAB — BASIC METABOLIC PANEL - CANCER CENTER ONLY
Anion gap: 11 (ref 5–15)
BUN: 32 mg/dL — ABNORMAL HIGH (ref 8–23)
CO2: 22 mmol/L (ref 22–32)
Calcium: 9.6 mg/dL (ref 8.9–10.3)
Chloride: 109 mmol/L (ref 98–111)
Creatinine: 1.32 mg/dL — ABNORMAL HIGH (ref 0.44–1.00)
GFR, Estimated: 42 mL/min — ABNORMAL LOW (ref >60)
Glucose, Bld: 115 mg/dL — ABNORMAL HIGH (ref 70–99)
Potassium: 4.5 mmol/L (ref 3.5–5.1)
Sodium: 142 mmol/L (ref 135–145)

## 2024-03-22 LAB — CBC WITH DIFFERENTIAL (CANCER CENTER ONLY)
Abs Immature Granulocytes: 0.01 K/uL (ref 0.00–0.07)
Basophils Absolute: 0 K/uL (ref 0.0–0.1)
Basophils Relative: 1 %
Eosinophils Absolute: 0.1 K/uL (ref 0.0–0.5)
Eosinophils Relative: 2 %
HCT: 40.4 % (ref 36.0–46.0)
Hemoglobin: 13.1 g/dL (ref 12.0–15.0)
Immature Granulocytes: 0 %
Lymphocytes Relative: 29 %
Lymphs Abs: 1.5 K/uL (ref 0.7–4.0)
MCH: 32.8 pg (ref 26.0–34.0)
MCHC: 32.4 g/dL (ref 30.0–36.0)
MCV: 101.3 fL — ABNORMAL HIGH (ref 80.0–100.0)
Monocytes Absolute: 0.5 K/uL (ref 0.1–1.0)
Monocytes Relative: 9 %
Neutro Abs: 3 K/uL (ref 1.7–7.7)
Neutrophils Relative %: 59 %
Platelet Count: 215 K/uL (ref 150–400)
RBC: 3.99 MIL/uL (ref 3.87–5.11)
RDW: 12.3 % (ref 11.5–15.5)
WBC Count: 5.1 K/uL (ref 4.0–10.5)
nRBC: 0 % (ref 0.0–0.2)

## 2024-03-22 LAB — FERRITIN: Ferritin: 122 ng/mL (ref 11–307)

## 2024-03-22 MED ORDER — PREGABALIN 50 MG PO CAPS: 50.0000 mg | ORAL_CAPSULE | Freq: Two times a day (BID) | ORAL | 0 refills | Status: DC

## 2024-03-22 NOTE — Progress Notes (Signed)
 Orthopedic Spine Surgery Office Note  Assessment: Patient is a 73 y.o. female with two issues:   Right arm pain and weakness when she wakes up.  It resolves after a few minutes.  She does not experience this during the day at all. Possible cervical radiculopathy Osteoporosis   Plan: -Explained that initially conservative treatment is tried as a significant number of patients may experience relief with these treatment modalities. Discussed that the conservative treatments include:  -activity modification  -physical therapy  -over the counter pain medications  -medrol  dosepak  -cervical steroid injections -Given the fact that the symptoms resolve shortly after getting out of bed, I did not recommend any advanced imaging at this time -Prescribed Lyrica  for pain relief to see if that will help with her arm pain when she wakes up.  If this is helpful, I can keep prescribing this going forward -Recommended physical therapy.  Referral provided to her today -We also talked about her osteoporosis.  I recommended treatment for that.  She said that there are side effects from those medications so she did not want a pursue treatment.  I told her that they could be helpful for preventing fragility fractures but she was still not interested -Patient should return to office on an as-needed basis   Patient expressed understanding of the plan and all questions were answered to the patient's satisfaction.   ___________________________________________________________________________   History:  Patient is a 73 y.o. female who presents today for cervical spine.  Patient has had about 6 months of right arm pain and weakness when she wakes up first thing in the morning.  She notices the pain along the lateral aspect the arm and dorsal forearm.  She also feels she has difficulty flexing the elbow.  She says that within 10 to 15 minutes, this has resolved.  She has not noticed this issue at all during the day.   She does not notice it at night either.  It is only first thing in the morning.  She has no symptoms on the left side.  There is no trauma or injury that preceded the onset of the symptoms.  She is not having any neck pain but does feel she has got decreased range of motion in her neck.   Weakness: Denies Difficulty with fine motor skills (e.g., buttoning shirts, handwriting): Denies Symptoms of imbalance: Denies Paresthesias and numbness: Denies Bowel or bladder incontinence: Denies Saddle anesthesia: Denies  Treatments tried: Tylenol , heat  Review of systems: Denies fevers and chills, night sweats, unexplained weight loss, history of cancer, pain that wakes them at night  Past medical history: Iron  deficiency anemia Depression/anxiety Hypothyroidism GERD Diverticulitis Osteoporosis  Allergies: augmentin , famotidine , klonopin, nitrofurantoin, reglan, bactrim  Past surgical history:  Cholecystectomy Gastrectomy Appendectomy Cataract surgery Left wrist fracture ORIF Bilateral THA Tubal ligation  Social history: Denies use of nicotine product (smoking, vaping, patches, smokeless) Alcohol use: denies Denies recreational drug use   Physical Exam:  BMI of 17.5  General: no acute distress, appears stated age Neurologic: alert, answering questions appropriately, following commands Respiratory: unlabored breathing on room air, symmetric chest rise Psychiatric: appropriate affect, normal cadence to speech   MSK (spine):  -Strength exam      Left  Right Grip strength                5/5  5/5 Interosseus   5/5   5/5 Wrist extension  5/5  5/5 Wrist flexion   5/5  5/5 Elbow flexion  5/5  5/5 Deltoid    5/5  5/5  -Sensory exam    Sensation intact to light touch in C5-T1 nerve distributions of bilateral upper extremities  -Brachioradialis DTR: 2/4 on the left, 2/4 on the right -Biceps DTR: 2/4 on the left, 2/4 on the right  -Spurling: Negative  bilaterally -Hoffman sign: Negative bilaterally -Clonus: No beats bilaterally -Interosseous wasting: None seen -Grip and release test: Negative -Romberg: Negative -Gait: Normal  Left shoulder exam: No pain through range of motion Right shoulder exam: No pain through range of motion  Imaging: XRs of the cervical spine from 03/22/2024 were independently reviewed and interpreted, showing grade 1 spondylolisthesis at C5/6.  No evidence of instability on flexion/extension views.  Disc height loss with anterior osteophyte formation at C5/6 and C6/7.  No fracture or dislocation seen.   Patient name: Brittany Hensley Patient MRN: [id number] Date of visit: 03/22/24

## 2024-03-22 NOTE — Telephone Encounter (Signed)
 Called Brittany Hensley w/lab results today. Routed copy to PCP and Dr. Watt (urology) and mailed copy to her home as well.

## 2024-03-29 ENCOUNTER — Ambulatory Visit: Attending: Orthopedic Surgery

## 2024-03-29 DIAGNOSIS — M79601 Pain in right arm: Secondary | ICD-10-CM | POA: Insufficient documentation

## 2024-03-29 DIAGNOSIS — M542 Cervicalgia: Secondary | ICD-10-CM | POA: Insufficient documentation

## 2024-03-29 NOTE — Therapy (Signed)
 OUTPATIENT PHYSICAL THERAPY CERVICAL EVALUATION   Patient Name: Brittany Hensley MRN: [id number] DOB:05-Nov-1950, 73 y.o., female Today's Date: 03/29/2024  END OF SESSION:  PT End of Session - 03/29/24 1349     Visit Number 1    Number of Visits 8    Date for PT Re-Evaluation 06/02/24    PT Start Time 1351    PT Stop Time 1419    PT Time Calculation (min) 28 min    Activity Tolerance Patient tolerated treatment well    Behavior During Therapy [facility] for tasks assessed/performed          Past Medical History:  Diagnosis Date   Anemia    iron     Anxiety    Arthritis    COVID    2020 bad cold s/s lasted 1 week. 04/28/2021   Depression    Diverticulitis    Hx: of   GERD (gastroesophageal reflux disease)    at times for 15 yrs 04/28/2021   History of kidney stones    10 years ago   Hypothyroidism    Low iron     Pneumonia    2020   Rosacea    Hx: of   SCC (squamous cell carcinoma) 08/13/2021   in situ-left temple (CX35FU)   SCC (squamous cell carcinoma) 08/13/2021   well diff-right forehead (CX35FU)   Squamous cell carcinoma of skin 08/03/2021   in situ- left malar cheek (CX35FU)   Past Surgical History:  Procedure Laterality Date   APPENDECTOMY     BALLOON DILATION  06/16/2011   Procedure: BALLOON DILATION;  Surgeon: Oliva FORBES Boots, MD;  Location: [facility] ENDOSCOPY;  Service: Endoscopy;  Laterality: N/A;   BALLOON DILATION N/A 10/03/2013   Procedure: BALLOON DILATION;  Surgeon: Oliva FORBES Boots, MD;  Location: [facility] ENDOSCOPY;  Service: Endoscopy;  Laterality: N/A;   BALLOON DILATION N/A 08/10/2014   Procedure: BALLOON DILATION;  Surgeon: Oliva FORBES Boots, MD;  Location: [facility] ENDOSCOPY;  Service: Endoscopy;  Laterality: N/A;  from 12 - 15 cm dilation completed   BALLOON DILATION N/A 02/15/2015   Procedure: BALLOON DILATION;  Surgeon: Oliva Boots, MD;  Location: [facility] ENDOSCOPY;  Service: Endoscopy;  Laterality: N/A;   BALLOON DILATION N/A 11/12/2015   Procedure: BALLOON DILATION;   Surgeon: Oliva Boots, MD;  Location: [facility] ENDOSCOPY;  Service: Endoscopy;  Laterality: N/A;   BALLOON DILATION N/A 08/10/2016   Procedure: BALLOON DILATION;  Surgeon: Oliva Boots, MD;  Location: [facility] ENDOSCOPY;  Service: Endoscopy;  Laterality: N/A;   BALLOON DILATION N/A 08/07/2016   Procedure: BALLOON DILATION;  Surgeon: Oliva Boots, MD;  Location: [facility] ENDOSCOPY;  Service: Endoscopy;  Laterality: N/A;   BALLOON DILATION N/A 05/20/2017   Procedure: BALLOON DILATION;  Surgeon: Boots Oliva, MD;  Location: [facility] ENDOSCOPY;  Service: Endoscopy;  Laterality: N/A;   BALLOON DILATION N/A 12/02/2017   Procedure: BALLOON DILATION;  Surgeon: Boots Oliva, MD;  Location: [facility] ENDOSCOPY;  Service: Endoscopy;  Laterality: N/A;   BALLOON DILATION N/A 07/19/2018   Procedure: BALLOON DILATION;  Surgeon: Boots Oliva, MD;  Location: [facility] ENDOSCOPY;  Service: Endoscopy;  Laterality: N/A;   BALLOON DILATION N/A 01/23/2019   Procedure: BALLOON DILATION;  Surgeon: Boots Oliva, MD;  Location: [facility] ENDOSCOPY;  Service: Endoscopy;  Laterality: N/A;   BALLOON DILATION N/A 07/13/2019   Procedure: BALLOON DILATION;  Surgeon: Boots Oliva, MD;  Location: [facility] ENDOSCOPY;  Service: Endoscopy;  Laterality: N/A;   BALLOON DILATION N/A 02/02/2020   Procedure: BALLOON DILATION;  Surgeon: Boots Oliva,  MD;  Location: [facility] ENDOSCOPY;  Service: Endoscopy;  Laterality: N/A;   BALLOON DILATION N/A 07/16/2020   Procedure: BALLOON DILATION;  Surgeon: Rosalie Kitchens, MD;  Location: [facility] ENDOSCOPY;  Service: Endoscopy;  Laterality: N/A;   BALLOON DILATION N/A 02/19/2021   Procedure: BALLOON DILATION;  Surgeon: Rosalie Kitchens, MD;  Location: [facility] ENDOSCOPY;  Service: Endoscopy;  Laterality: N/A;   BALLOON DILATION N/A 09/30/2021   Procedure: BALLOON DILATION;  Surgeon: Rosalie Kitchens, MD;  Location: [facility] ENDOSCOPY;  Service: Endoscopy;  Laterality: N/A;   BALLOON DILATION N/A 03/19/2022   Procedure: BALLOON DILATION;  Surgeon: Rosalie Kitchens, MD;  Location: [facility] ENDOSCOPY;   Service: Gastroenterology;  Laterality: N/A;   BALLOON DILATION N/A 05/05/2023   Procedure: BALLOON DILATION;  Surgeon: Rosalie Kitchens, MD;  Location: [facility] ENDOSCOPY;  Service: Gastroenterology;  Laterality: N/A;   CATARACT EXTRACTION, BILATERAL     2019   CHOLECYSTECTOMY OPEN  1979   COLONOSCOPY     Hx: of   CYSTOSCOPY/URETEROSCOPY/HOLMIUM LASER/STENT PLACEMENT Bilateral 05/06/2021   Procedure: CYSTOSCOPY BILATERAL RETROGRADE LEFT URETEROSCOPY/HOLMIUM LASER/STENT PLACEMENT;  Surgeon: Watt Rush, MD;  Location: [facility];  Service: Urology;  Laterality: Bilateral;   ESOPHAGOGASTRODUODENOSCOPY  06/16/2011   Procedure: ESOPHAGOGASTRODUODENOSCOPY (EGD);  Surgeon: Kitchens FORBES Rosalie, MD;  Location: [facility] ENDOSCOPY;  Service: Endoscopy;  Laterality: N/A;   ESOPHAGOGASTRODUODENOSCOPY N/A 10/03/2013   Procedure: ESOPHAGOGASTRODUODENOSCOPY (EGD);  Surgeon: Kitchens FORBES Rosalie, MD;  Location: [facility];  Service: Endoscopy;  Laterality: N/A;   ESOPHAGOGASTRODUODENOSCOPY N/A 08/10/2014   Procedure: ESOPHAGOGASTRODUODENOSCOPY (EGD);  Surgeon: Kitchens FORBES Rosalie, MD;  Location: [facility];  Service: Endoscopy;  Laterality: N/A;   ESOPHAGOGASTRODUODENOSCOPY N/A 08/10/2016   Procedure: ESOPHAGOGASTRODUODENOSCOPY (EGD);  Surgeon: Kitchens Rosalie, MD;  Location: [facility] ENDOSCOPY;  Service: Endoscopy;  Laterality: N/A;   ESOPHAGOGASTRODUODENOSCOPY N/A 07/19/2018   Procedure: ESOPHAGOGASTRODUODENOSCOPY (EGD);  Surgeon: Rosalie Kitchens, MD;  Location: [facility];  Service: Endoscopy;  Laterality: N/A;   ESOPHAGOGASTRODUODENOSCOPY (EGD) WITH PROPOFOL  N/A 02/15/2015   Procedure: ESOPHAGOGASTRODUODENOSCOPY (EGD) WITH PROPOFOL ;  Surgeon: Kitchens Rosalie, MD;  Location: [facility] ENDOSCOPY;  Service: Endoscopy;  Laterality: N/A;   ESOPHAGOGASTRODUODENOSCOPY (EGD) WITH PROPOFOL  N/A 11/12/2015   Procedure: ESOPHAGOGASTRODUODENOSCOPY (EGD) WITH PROPOFOL ;  Surgeon: Kitchens Rosalie, MD;  Location: [facility] ENDOSCOPY;  Service: Endoscopy;  Laterality: N/A;    ESOPHAGOGASTRODUODENOSCOPY (EGD) WITH PROPOFOL  N/A 08/07/2016   Procedure: ESOPHAGOGASTRODUODENOSCOPY (EGD) WITH PROPOFOL ;  Surgeon: Kitchens Rosalie, MD;  Location: [facility] ENDOSCOPY;  Service: Endoscopy;  Laterality: N/A;   ESOPHAGOGASTRODUODENOSCOPY (EGD) WITH PROPOFOL  N/A 05/20/2017   Procedure: ESOPHAGOGASTRODUODENOSCOPY (EGD) WITH PROPOFOL ;  Surgeon: Rosalie Kitchens, MD;  Location: [facility] ENDOSCOPY;  Service: Endoscopy;  Laterality: N/A;   ESOPHAGOGASTRODUODENOSCOPY (EGD) WITH PROPOFOL  N/A 12/02/2017   Procedure: ESOPHAGOGASTRODUODENOSCOPY (EGD) WITH PROPOFOL ;  Surgeon: Rosalie Kitchens, MD;  Location: [facility]OSCOPY;  Service: Endoscopy;  Laterality: N/A;  have c arm available   ESOPHAGOGASTRODUODENOSCOPY (EGD) WITH PROPOFOL  N/A 01/23/2019   Procedure: ESOPHAGOGASTRODUODENOSCOPY (EGD) WITH PROPOFOL ;  Surgeon: Rosalie Kitchens, MD;  Location: [facility] ENDOSCOPY;  Service: Endoscopy;  Laterality: N/A;   ESOPHAGOGASTRODUODENOSCOPY (EGD) WITH PROPOFOL  N/A 07/13/2019   Procedure: ESOPHAGOGASTRODUODENOSCOPY (EGD) WITH PROPOFOL ;  Surgeon: Rosalie Kitchens, MD;  Location: [facility] ENDOSCOPY;  Service: Endoscopy;  Laterality: N/A;   ESOPHAGOGASTRODUODENOSCOPY (EGD) WITH PROPOFOL  N/A 02/02/2020   Procedure: ESOPHAGOGASTRODUODENOSCOPY (EGD) WITH PROPOFOL ;  Surgeon: Rosalie Kitchens, MD;  Location: [facility] ENDOSCOPY;  Service: Endoscopy;  Laterality: N/A;   ESOPHAGOGASTRODUODENOSCOPY (EGD) WITH PROPOFOL  N/A 07/16/2020   Procedure: ESOPHAGOGASTRODUODENOSCOPY (EGD) WITH PROPOFOL ;  Surgeon: Rosalie Kitchens, MD;  Location: [facility] ENDOSCOPY;  Service: Endoscopy;  Laterality: N/A;   ESOPHAGOGASTRODUODENOSCOPY (EGD) WITH PROPOFOL  N/A 02/19/2021   Procedure: ESOPHAGOGASTRODUODENOSCOPY (EGD) WITH PROPOFOL ;  Surgeon: Rosalie Kitchens, MD;  Location: [facility] ENDOS[facility];  Service: Endoscopy;  Laterality: N/A;  with possible botox    ESOPHAGOGASTRODUODENOSCOPY (EGD) WITH PROPOFOL  N/A 09/30/2021   Procedure: ESOPHAGOGASTRODUODENOSCOPY (EGD) WITH PROPOFOL ;  Surgeon: Rosalie Kitchens, MD;  Location: [facility]  ENDOSCOPY;  Service: Endoscopy;  Laterality: N/A;   ESOPHAGOGASTRODUODENOSCOPY (EGD) WITH PROPOFOL  N/A 03/19/2022   Procedure: ESOPHAGOGASTRODUODENOSCOPY (EGD) WITH PROPOFOL ;  Surgeon: Rosalie Kitchens, MD;  Location: [facility] ENDOSCOPY;  Service: Gastroenterology;  Laterality: N/A;   ESOPHAGOGASTRODUODENOSCOPY (EGD) WITH PROPOFOL  N/A 05/05/2023   Procedure: ESOPHAGOGASTRODUODENOSCOPY (EGD) WITH PROPOFOL ;  Surgeon: Rosalie Kitchens, MD;  Location: [facility] ENDOSCOPY;  Service: Gastroenterology;  Laterality: N/A;  with balloon dilation   FOREIGN BODY REMOVAL  07/19/2018   Procedure: FOREIGN BODY REMOVAL;  Surgeon: Rosalie Kitchens, MD;  Location: [facility] ENDOSCOPY;  Service: Endoscopy;;   FRACTURE SURGERY     Hx: of left wrist 01/2021   GASTRECTOMY  [phone]   GASTRECTOMY N/A    X3   LITHOTRIPSY     Hx; of for kidney stones [id number]   SAVORY DILATION N/A 08/10/2014   Procedure: SAVORY DILATION;  Surgeon: Kitchens FORBES Rosalie, MD;  Location: [facility] ENDOSCOPY;  Service: Endoscopy;  Laterality: N/A;   TOTAL HIP ARTHROPLASTY Right 12/30/2017   Procedure: RIGHT TOTAL HIP ARTHROPLASTY;  Surgeon: Margrette Taft FORBES, MD;  Location: [facility];  Service: Orthopedics;  Laterality: Right;   TOTAL HIP ARTHROPLASTY Left 02/17/2018   Procedure: TOTAL HIP ARTHROPLASTY;  Surgeon: Margrette Taft FORBES, MD;  Location: [facility];  Service: Orthopedics;  Laterality: Left;   TUBAL LIGATION     1975   Patient Active Problem List   Diagnosis Date Noted   Stricture of esophagus 08/21/2022   Closed nondisplaced oblique fracture of shaft of ulna with routine healing 03/20/2021   Closed fracture of lower end of right ulna 01/11/2021   Oral phase dysphagia 12/19/2020   Slow transit constipation 12/19/2020   Weight decreased 12/19/2020   Acquired hypertrophic pyloric stenosis 12/19/2020   Osteoporosis without pathological fracture 06/18/2020   Anemia, unspecified 06/27/2018   Age-related osteoporosis with current pathological fracture of right femur (HCC)  03/01/2018   Status post total hip replacement, left 02/17/18 02/17/2018   Closed displaced fracture of left femoral neck (HCC) 02/14/2018   S/P hip replacement, right 12/30/17 01/13/2018   Closed displaced fracture of right femoral neck (HCC) 12/30/2017   BMI less than 19,adult 01/10/2016   GAD (generalized anxiety disorder) 01/10/2016   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   Hypothyroidism 04/24/2013   GERD (gastroesophageal reflux disease) 04/24/2013   Rosacea 04/24/2013   Iron  deficiency anemia due to chronic blood loss 06/09/2012   Gastric outlet obstruction 06/16/2011   Atrioventricular nodal re-entry tachycardia (HCC) 03/11/2010   MURMUR 03/11/2010   PERSONAL HISTORY OF URINARY CALCULI 02/14/2008   REFERRING PROVIDER: Georgina Ozell LABOR, MD   REFERRING DIAG: Cervicalgia   THERAPY DIAG:  Cervicalgia  Pain in right arm  Rationale for Evaluation and Treatment: Rehabilitation  ONSET DATE: 6-7 months  SUBJECTIVE:  SUBJECTIVE STATEMENT: Patient reports that her neck has been bothering her for about 6-7 months with no known cause. Her pain is mostly on her right side of her neck and down her arms, but now it has begun to go down her left side. She has been trying to do some exercises at home such as squeezing putty. She is still able to do everything she needs to at home, but it hurts. She can tell that her medication is starting to help her pain.  Hand dominance: Right  PERTINENT HISTORY:  Osteoporosis, anxiety, arthritis,history of COVID-19, depression, and allergies  PAIN:  Are you having pain? Yes: NPRS scale: Current: 0/10 Best: 0/10 Worst: 2-3/10 Pain location: neck and right arm Pain description: intermittent tingling Aggravating factors: moving her neck Relieving  factors: none known  PRECAUTIONS: None  RED FLAGS: None    WEIGHT BEARING RESTRICTIONS: No  FALLS:  Has patient fallen in last 6 months? No  LIVING ENVIRONMENT: Lives with: lives alone Lives in: House/apartment  OCCUPATION: retired   PLOF: Independent  PATIENT GOALS: reduced pain and improved mobility  NEXT MD VISIT: only as needed  OBJECTIVE:  Note: Objective measures were completed at Evaluation unless otherwise noted.  DIAGNOSTIC FINDINGS: 03/22/24 cervical x-ray grade 1 spondylolisthesis at C5/6.  No evidence of  instability on flexion/extension views.  Disc height loss with anterior osteophyte formation at C5/6 and C6/7.  No fracture or dislocation seen.   PATIENT SURVEYS:  NDI:  NECK DISABILITY INDEX  Date: 03/29/24 Score  Pain intensity 1 = The pain is very mild at the moment  2. Personal care (washing, dressing, etc.) 0 = I can look after myself normally without causing extra pain  3. Lifting 1 =  I can lift heavy weights but it gives extra pain  4. Reading 1 = I can read as much as I want to with slight pain in my neck  5. Headaches 0 = I have no headaches at all  6. Concentration 0 =  I can concentrate fully when I want to with no difficulty  7. Work 1 =  I can only do my usual work, but no more  8. Driving 0 = I can drive my car without any neck pain  9. Sleeping 0 = I have no trouble sleeping  10. Recreation 0 = I am able to engage in all my recreation activities with no neck pain at all  Total 4/50   Minimum Detectable Change (90% confidence): 5 points or 10% points  COGNITION: Overall cognitive status: Within functional limits for tasks assessed  SENSATION: Patient reports no numbness or tingling  POSTURE: rounded shoulders and forward head  PALPATION: TTP: bilateral scapular stabilizers, right upper trapezius, levator scapulae, and cervical paraspinals   CERVICAL ROM: significant difficulty isolating cervical rotation both directions  Active  ROM A/PROM (deg) eval  Flexion 48  Extension 42; sore  Right lateral flexion 32; tension   Left lateral flexion 36  Right rotation 40  Left rotation 45   (Blank rows = not tested)  UPPER EXTREMITY ROM: WFL for activities assessed  UPPER EXTREMITY MMT:  MMT Right eval Left eval  Shoulder flexion 4/5 4/5  Shoulder extension    Shoulder abduction 4/5 4/5  Shoulder adduction    Shoulder extension    Shoulder internal rotation    Shoulder external rotation    Middle trapezius    Lower trapezius    Elbow flexion 4/5 4+/5  Elbow extension 3+/5 4/5  Wrist flexion    Wrist extension    Wrist ulnar deviation    Wrist radial deviation    Wrist pronation    Wrist supination    Grip strength 15 18   (Blank rows = not tested)  CERVICAL SPECIAL TESTS:  Spurling's test: Negative and Sharp pursor's test: Negative  TREATMENT DATE:                                                                                                                                  PATIENT EDUCATION:  Education details: Plan of care, prognosis, objective findings, healing, and goals for physical therapy Person educated: Patient Education method: Explanation Education comprehension: verbalized understanding  HOME EXERCISE PROGRAM:   ASSESSMENT:  CLINICAL IMPRESSION: Patient is a 73 y.o. female who was seen today for physical therapy evaluation and treatment for cervical pain with radiating symptoms into her right upper extremity.  She presented with low pain severity and irritability with palpation to her right upper trapezius and scapular stabilizers being the most aggravating to her familiar symptoms.  She also exhibited significant deficits in cervical active range of motion with cervical rotation being the most difficult.  Recommend that she continue with skilled physical therapy to address her impairments to return to her prior level of function.  OBJECTIVE IMPAIRMENTS: decreased mobility,  decreased ROM, decreased strength, hypomobility, impaired tone, impaired UE functional use, postural dysfunction, and pain.   ACTIVITY LIMITATIONS: lifting and sleeping  PARTICIPATION LIMITATIONS: meal prep, cleaning, and laundry  PERSONAL FACTORS: Past/current experiences, Time since onset of injury/illness/exacerbation, and 3+ comorbidities: Osteoporosis, anxiety, arthritis,history of COVID-19, depression, and allergies are also affecting patient's functional outcome.   REHAB POTENTIAL: Good  CLINICAL DECISION MAKING: Evolving/moderate complexity  EVALUATION COMPLEXITY: Moderate   GOALS: Goals reviewed with patient? Yes  LONG TERM GOALS: Target date: 04/26/24  Patient will be independent with her HEP. Baseline:  Goal status: INITIAL  2.  Patient will be able to demonstrate at least 50 degrees of cervical rotation bilaterally for improved awareness of her surroundings. Baseline:  Goal status: INITIAL  3.  Patient will improve her right triceps strength to at least 4/5 for improved function reaching and picking up items at home. Baseline:  Goal status: INITIAL  PLAN:  PT FREQUENCY: 1-2x/week  PT DURATION: 4 weeks  PLANNED INTERVENTIONS: 02835- PT Re-evaluation, 97750- Physical Performance Testing, 97110-Therapeutic exercises, 97530- Therapeutic activity, [id number]- Neuromuscular re-education, 97535- Self Care, 02859- Manual therapy, G0283- Electrical stimulation (unattended), 02987- Traction (mechanical), 20560 (1-2 muscles), 20561 (3+ muscles)- Dry Needling, Patient/Family education, Joint mobilization, Spinal mobilization, Cryotherapy, and Moist heat  PLAN FOR NEXT SESSION: UBE, manual therapy, scapular retraction, upper extremity strengthening, and modalities as needed   Lacinda JAYSON Fass, PT 03/29/2024, 5:00 PM

## 2024-04-04 ENCOUNTER — Ambulatory Visit: Attending: Orthopedic Surgery

## 2024-04-04 DIAGNOSIS — M542 Cervicalgia: Secondary | ICD-10-CM | POA: Insufficient documentation

## 2024-04-04 DIAGNOSIS — M62838 Other muscle spasm: Secondary | ICD-10-CM | POA: Diagnosis not present

## 2024-04-04 DIAGNOSIS — M79601 Pain in right arm: Secondary | ICD-10-CM | POA: Insufficient documentation

## 2024-04-04 NOTE — Therapy (Signed)
 OUTPATIENT PHYSICAL THERAPY CERVICAL TREATMENT   Patient Name: Brittany Hensley MRN: [id number] DOB:[birth year], 73 y.o., female Today's Date: 04/04/2024  END OF SESSION:  PT End of Session - 04/04/24 1437     Visit Number 2    Number of Visits 8    Date for PT Re-Evaluation 06/02/24    PT Start Time 1433    PT Stop Time 1516    PT Time Calculation (min) 43 min    Activity Tolerance Patient tolerated treatment well    Behavior During Therapy [facility] for tasks assessed/performed           Past Medical History:  Diagnosis Date   Anemia    iron     Anxiety    Arthritis    COVID    2020 bad cold s/s lasted 1 week. 04/28/2021   Depression    Diverticulitis    Hx: of   GERD (gastroesophageal reflux disease)    at times for 15 yrs 04/28/2021   History of kidney stones    10 years ago   Hypothyroidism    Low iron     Pneumonia    2020   Rosacea    Hx: of   SCC (squamous cell carcinoma) 08/13/2021   in situ-left temple (CX35FU)   SCC (squamous cell carcinoma) 08/13/2021   well diff-right forehead (CX35FU)   Squamous cell carcinoma of skin 08/03/2021   in situ- left malar cheek (CX35FU)   Past Surgical History:  Procedure Laterality Date   APPENDECTOMY     BALLOON DILATION  06/16/2011   Procedure: BALLOON DILATION;  Surgeon: Oliva FORBES Boots, MD;  Location: [facility] ENDOSCOPY;  Service: Endoscopy;  Laterality: N/A;   BALLOON DILATION N/A 10/03/2013   Procedure: BALLOON DILATION;  Surgeon: Oliva FORBES Boots, MD;  Location: [facility] ENDOSCOPY;  Service: Endoscopy;  Laterality: N/A;   BALLOON DILATION N/A 08/10/2014   Procedure: BALLOON DILATION;  Surgeon: Oliva FORBES Boots, MD;  Location: [facility] ENDOSCOPY;  Service: Endoscopy;  Laterality: N/A;  from 12 - 15 cm dilation completed   BALLOON DILATION N/A 02/15/2015   Procedure: BALLOON DILATION;  Surgeon: Oliva Boots, MD;  Location: [facility] ENDOSCOPY;  Service: Endoscopy;  Laterality: N/A;   BALLOON DILATION N/A 11/12/2015   Procedure: BALLOON DILATION;   Surgeon: Oliva Boots, MD;  Location: [facility] ENDOSCOPY;  Service: Endoscopy;  Laterality: N/A;   BALLOON DILATION N/A 08/10/2016   Procedure: BALLOON DILATION;  Surgeon: Oliva Boots, MD;  Location: [facility] ENDOSCOPY;  Service: Endoscopy;  Laterality: N/A;   BALLOON DILATION N/A 08/07/2016   Procedure: BALLOON DILATION;  Surgeon: Oliva Boots, MD;  Location: [facility] ENDOSCOPY;  Service: Endoscopy;  Laterality: N/A;   BALLOON DILATION N/A 05/20/2017   Procedure: BALLOON DILATION;  Surgeon: Boots Oliva, MD;  Location: [facility] ENDOSCOPY;  Service: Endoscopy;  Laterality: N/A;   BALLOON DILATION N/A 12/02/2017   Procedure: BALLOON DILATION;  Surgeon: Boots Oliva, MD;  Location: [facility] ENDOSCOPY;  Service: Endoscopy;  Laterality: N/A;   BALLOON DILATION N/A 07/19/2018   Procedure: BALLOON DILATION;  Surgeon: Boots Oliva, MD;  Location: [facility] ENDOSCOPY;  Service: Endoscopy;  Laterality: N/A;   BALLOON DILATION N/A 01/23/2019   Procedure: BALLOON DILATION;  Surgeon: Boots Oliva, MD;  Location: [facility] ENDOSCOPY;  Service: Endoscopy;  Laterality: N/A;   BALLOON DILATION N/A 07/13/2019   Procedure: BALLOON DILATION;  Surgeon: Boots Oliva, MD;  Location: [facility]OSCOPY;  Service: Endoscopy;  Laterality: N/A;   BALLOON DILATION N/A 02/02/2020   Procedure: BALLOON DILATION;  Surgeon: Boots,  Oliva, MD;  Location: [facility]COPY;  Service: Endoscopy;  Laterality: N/A;   BALLOON DILATION N/A 07/16/2020   Procedure: BALLOON DILATION;  Surgeon: Rosalie Oliva, MD;  Location: [facility] ENDOSCOPY;  Service: Endoscopy;  Laterality: N/A;   BALLOON DILATION N/A 02/19/2021   Procedure: BALLOON DILATION;  Surgeon: Rosalie Oliva, MD;  Location: [facility] ENDOSCOPY;  Service: Endoscopy;  Laterality: N/A;   BALLOON DILATION N/A 09/30/2021   Procedure: BALLOON DILATION;  Surgeon: Rosalie Oliva, MD;  Location: [facility] ENDOSCOPY;  Service: Endoscopy;  Laterality: N/A;   BALLOON DILATION N/A 03/19/2022   Procedure: BALLOON DILATION;  Surgeon: Rosalie Oliva, MD;  Location: [facility] ENDOSCOPY;   Service: Gastroenterology;  Laterality: N/A;   BALLOON DILATION N/A 05/05/2023   Procedure: BALLOON DILATION;  Surgeon: Rosalie Oliva, MD;  Location: [facility] ENDOSCOPY;  Service: Gastroenterology;  Laterality: N/A;   CATARACT EXTRACTION, BILATERAL     2019   CHOLECYSTECTOMY OPEN  1979   COLONOSCOPY     Hx: of   CYSTOSCOPY/URETEROSCOPY/HOLMIUM LASER/STENT PLACEMENT Bilateral 05/06/2021   Procedure: CYSTOSCOPY BILATERAL RETROGRADE LEFT URETEROSCOPY/HOLMIUM LASER/STENT PLACEMENT;  Surgeon: Watt Rush, MD;  Location: [facility];  Service: Urology;  Laterality: Bilateral;   ESOPHAGOGASTRODUODENOSCOPY  06/16/2011   Procedure: ESOPHAGOGASTRODUODENOSCOPY (EGD);  Surgeon: Oliva FORBES Rosalie, MD;  Location: [facility] ENDOSCOPY;  Service: Endoscopy;  Laterality: N/A;   ESOPHAGOGASTRODUODENOSCOPY N/A 10/03/2013   Procedure: ESOPHAGOGASTRODUODENOSCOPY (EGD);  Surgeon: Oliva FORBES Rosalie, MD;  Location: [facility];  Service: Endoscopy;  Laterality: N/A;   ESOPHAGOGASTRODUODENOSCOPY N/A 08/10/2014   Procedure: ESOPHAGOGASTRODUODENOSCOPY (EGD);  Surgeon: Oliva FORBES Rosalie, MD;  Location: [facility];  Service: Endoscopy;  Laterality: N/A;   ESOPHAGOGASTRODUODENOSCOPY N/A 08/10/2016   Procedure: ESOPHAGOGASTRODUODENOSCOPY (EGD);  Surgeon: Oliva Rosalie, MD;  Location: [facility] ENDOSCOPY;  Service: Endoscopy;  Laterality: N/A;   ESOPHAGOGASTRODUODENOSCOPY N/A 07/19/2018   Procedure: ESOPHAGOGASTRODUODENOSCOPY (EGD);  Surgeon: Rosalie Oliva, MD;  Location: [facility];  Service: Endoscopy;  Laterality: N/A;   ESOPHAGOGASTRODUODENOSCOPY (EGD) WITH PROPOFOL  N/A 02/15/2015   Procedure: ESOPHAGOGASTRODUODENOSCOPY (EGD) WITH PROPOFOL ;  Surgeon: Oliva Rosalie, MD;  Location: [facility] ENDOSCOPY;  Service: Endoscopy;  Laterality: N/A;   ESOPHAGOGASTRODUODENOSCOPY (EGD) WITH PROPOFOL  N/A 11/12/2015   Procedure: ESOPHAGOGASTRODUODENOSCOPY (EGD) WITH PROPOFOL ;  Surgeon: Oliva Rosalie, MD;  Location: [facility] ENDOSCOPY;  Service: Endoscopy;  Laterality: N/A;    ESOPHAGOGASTRODUODENOSCOPY (EGD) WITH PROPOFOL  N/A 08/07/2016   Procedure: ESOPHAGOGASTRODUODENOSCOPY (EGD) WITH PROPOFOL ;  Surgeon: Oliva Rosalie, MD;  Location: [facility] ENDOSCOPY;  Service: Endoscopy;  Laterality: N/A;   ESOPHAGOGASTRODUODENOSCOPY (EGD) WITH PROPOFOL  N/A 05/20/2017   Procedure: ESOPHAGOGASTRODUODENOSCOPY (EGD) WITH PROPOFOL ;  Surgeon: Rosalie Oliva, MD;  Location: [facility] ENDOSCOPY;  Service: Endoscopy;  Laterality: N/A;   ESOPHAGOGASTRODUODENOSCOPY (EGD) WITH PROPOFOL  N/A 12/02/2017   Procedure: ESOPHAGOGASTRODUODENOSCOPY (EGD) WITH PROPOFOL ;  Surgeon: Rosalie Oliva, MD;  Location: [facility] ENDOSCOPY;  Service: Endoscopy;  Laterality: N/A;  have c arm available   ESOPHAGOGASTRODUODENOSCOPY (EGD) WITH PROPOFOL  N/A 01/23/2019   Procedure: ESOPHAGOGASTRODUODENOSCOPY (EGD) WITH PROPOFOL ;  Surgeon: Rosalie Oliva, MD;  Location: [facility] ENDOSCOPY;  Service: Endoscopy;  Laterality: N/A;   ESOPHAGOGASTRODUODENOSCOPY (EGD) WITH PROPOFOL  N/A 07/13/2019   Procedure: ESOPHAGOGASTRODUODENOSCOPY (EGD) WITH PROPOFOL ;  Surgeon: Rosalie Oliva, MD;  Location: [facility] ENDOSCOPY;  Service: Endoscopy;  Laterality: N/A;   ESOPHAGOGASTRODUODENOSCOPY (EGD) WITH PROPOFOL  N/A 02/02/2020   Procedure: ESOPHAGOGASTRODUODENOSCOPY (EGD) WITH PROPOFOL ;  Surgeon: Rosalie Oliva, MD;  Location: [facility] ENDOSCOPY;  Service: Endoscopy;  Laterality: N/A;   ESOPHAGOGASTRODUODENOSCOPY (EGD) WITH PROPOFOL  N/A 07/16/2020   Procedure: ESOPHAGOGASTRODUODENOSCOPY (EGD) WITH PROPOFOL ;  Surgeon: Rosalie Oliva, MD;  Location: [facility] ENDOSCOPY;  Service: Endoscopy;  Laterality: N/A;   ESOPHAGOGASTRODUODENOSCOPY (EGD) WITH PROPOFOL  N/A 02/19/2021   Procedure: ESOPHAGOGASTRODUODENOSCOPY (EGD) WITH PROPOFOL ;  Surgeon: Rosalie Kitchens, MD;  Location: [facility] ENDOSCOPY;  Service: Endoscopy;  Laterality: N/A;  with possible botox    ESOPHAGOGASTRODUODENOSCOPY (EGD) WITH PROPOFOL  N/A 09/30/2021   Procedure: ESOPHAGOGASTRODUODENOSCOPY (EGD) WITH PROPOFOL ;  Surgeon: Rosalie Kitchens, MD;  Location: [facility]  ENDOSCOPY;  Service: Endoscopy;  Laterality: N/A;   ESOPHAGOGASTRODUODENOSCOPY (EGD) WITH PROPOFOL  N/A 03/19/2022   Procedure: ESOPHAGOGASTRODUODENOSCOPY (EGD) WITH PROPOFOL ;  Surgeon: Rosalie Kitchens, MD;  Location: [facility] ENDOSCOPY;  Service: Gastroenterology;  Laterality: N/A;   ESOPHAGOGASTRODUODENOSCOPY (EGD) WITH PROPOFOL  N/A 05/05/2023   Procedure: ESOPHAGOGASTRODUODENOSCOPY (EGD) WITH PROPOFOL ;  Surgeon: Rosalie Kitchens, MD;  Location: [facility] ENDOSCOPY;  Service: Gastroenterology;  Laterality: N/A;  with balloon dilation   FOREIGN BODY REMOVAL  07/19/2018   Procedure: FOREIGN BODY REMOVAL;  Surgeon: Rosalie Kitchens, MD;  Location: [facility] ENDOSCOPY;  Service: Endoscopy;;   FRACTURE SURGERY     Hx: of left wrist 01/2021   GASTRECTOMY  [phone]   GASTRECTOMY N/A    X3   LITHOTRIPSY     Hx; of for kidney stones [id number]   SAVORY DILATION N/A 08/10/2014   Procedure: SAVORY DILATION;  Surgeon: Kitchens FORBES Rosalie, MD;  Location: [facility] ENDOSCOPY;  Service: Endoscopy;  Laterality: N/A;   TOTAL HIP ARTHROPLASTY Right 12/30/2017   Procedure: RIGHT TOTAL HIP ARTHROPLASTY;  Surgeon: Margrette Taft FORBES, MD;  Location: [facility];  Service: Orthopedics;  Laterality: Right;   TOTAL HIP ARTHROPLASTY Left 02/17/2018   Procedure: TOTAL HIP ARTHROPLASTY;  Surgeon: Margrette Taft FORBES, MD;  Location: [facility];  Service: Orthopedics;  Laterality: Left;   TUBAL LIGATION     1975   Patient Active Problem List   Diagnosis Date Noted   Stricture of esophagus 08/21/2022   Closed nondisplaced oblique fracture of shaft of ulna with routine healing 03/20/2021   Closed fracture of lower end of right ulna 01/11/2021   Oral phase dysphagia 12/19/2020   Slow transit constipation 12/19/2020   Weight decreased 12/19/2020   Acquired hypertrophic pyloric stenosis 12/19/2020   Osteoporosis without pathological fracture 06/18/2020   Anemia, unspecified 06/27/2018   Age-related osteoporosis with current pathological fracture of right femur (HCC)  03/01/2018   Status post total hip replacement, left 02/17/18 02/17/2018   Closed displaced fracture of left femoral neck (HCC) 02/14/2018   S/P hip replacement, right 12/30/17 01/13/2018   Closed displaced fracture of right femoral neck (HCC) 12/30/2017   BMI less than 19,adult 01/10/2016   GAD (generalized anxiety disorder) 01/10/2016   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   Hypothyroidism 04/24/2013   GERD (gastroesophageal reflux disease) 04/24/2013   Rosacea 04/24/2013   Iron  deficiency anemia due to chronic blood loss 06/09/2012   Gastric outlet obstruction 06/16/2011   Atrioventricular nodal re-entry tachycardia (HCC) 03/11/2010   MURMUR 03/11/2010   PERSONAL HISTORY OF URINARY CALCULI 02/14/2008   REFERRING PROVIDER: Georgina Ozell LABOR, MD   REFERRING DIAG: Cervicalgia   THERAPY DIAG:  Cervicalgia  Pain in right arm  Rationale for Evaluation and Treatment: Rehabilitation  ONSET DATE: 6-7 months  SUBJECTIVE:  SUBJECTIVE STATEMENT: Patient reports that she feels like therapy will help, but she is hurting a little right now.  Hand dominance: Right  PERTINENT HISTORY:  Osteoporosis, anxiety, arthritis,history of COVID-19, depression, and allergies  PAIN:  Are you having pain? Yes: NPRS scale: Current: 1-2/10 Best: 0/10 Worst: 2-3/10 Pain location: neck and right arm Pain description: intermittent tingling Aggravating factors: moving her neck Relieving factors: none known  PRECAUTIONS: None  RED FLAGS: None    WEIGHT BEARING RESTRICTIONS: No  FALLS:  Has patient fallen in last 6 months? No  LIVING ENVIRONMENT: Lives with: lives alone Lives in: House/apartment  OCCUPATION: retired   PLOF: Independent  PATIENT GOALS: reduced pain and improved  mobility  NEXT MD VISIT: only as needed  OBJECTIVE:  Note: Objective measures were completed at Evaluation unless otherwise noted.  DIAGNOSTIC FINDINGS: 03/22/24 cervical x-ray grade 1 spondylolisthesis at C5/6.  No evidence of  instability on flexion/extension views.  Disc height loss with anterior osteophyte formation at C5/6 and C6/7.  No fracture or dislocation seen.   PATIENT SURVEYS:  NDI:  NECK DISABILITY INDEX  Date: 03/29/24 Score  Pain intensity 1 = The pain is very mild at the moment  2. Personal care (washing, dressing, etc.) 0 = I can look after myself normally without causing extra pain  3. Lifting 1 =  I can lift heavy weights but it gives extra pain  4. Reading 1 = I can read as much as I want to with slight pain in my neck  5. Headaches 0 = I have no headaches at all  6. Concentration 0 =  I can concentrate fully when I want to with no difficulty  7. Work 1 =  I can only do my usual work, but no more  8. Driving 0 = I can drive my car without any neck pain  9. Sleeping 0 = I have no trouble sleeping  10. Recreation 0 = I am able to engage in all my recreation activities with no neck pain at all  Total 4/50   Minimum Detectable Change (90% confidence): 5 points or 10% points  COGNITION: Overall cognitive status: Within functional limits for tasks assessed  SENSATION: Patient reports no numbness or tingling  POSTURE: rounded shoulders and forward head  PALPATION: TTP: bilateral scapular stabilizers, right upper trapezius, levator scapulae, and cervical paraspinals   CERVICAL ROM: significant difficulty isolating cervical rotation both directions  Active ROM A/PROM (deg) eval  Flexion 48  Extension 42; sore  Right lateral flexion 32; tension   Left lateral flexion 36  Right rotation 40  Left rotation 45   (Blank rows = not tested)  UPPER EXTREMITY ROM: WFL for activities assessed  UPPER EXTREMITY MMT:  MMT Right eval Left eval  Shoulder flexion  4/5 4/5  Shoulder extension    Shoulder abduction 4/5 4/5  Shoulder adduction    Shoulder extension    Shoulder internal rotation    Shoulder external rotation    Middle trapezius    Lower trapezius    Elbow flexion 4/5 4+/5  Elbow extension 3+/5 4/5  Wrist flexion    Wrist extension    Wrist ulnar deviation    Wrist radial deviation    Wrist pronation    Wrist supination    Grip strength 15 18   (Blank rows = not tested)  CERVICAL SPECIAL TESTS:  Spurling's test: Negative and Sharp pursor's test: Negative  TREATMENT DATE:  04/04/24 EXERCISE LOG  Exercise Repetitions and Resistance Comments  UBE  10 minutes @ 120 RPM   Resisted row  Green t-band x 20 reps   Resisted pull down   Green t-band x 20 reps    Therabar bending  Red t-bar x 20 reps each  Up and down   Bicep curl  3# x 15 reps each    Bilateral shoulder ER  Red t-band x 20 reps  With tactile cueing to prevent shoulder ABD  Isometric ball squeeze  20 reps w/ 3 second hold  For shoulder IR   Cervical SNAG's 10 reps each  For rotation   Blank cell = exercise not performed today    PATIENT EDUCATION:  Education details: plan for physical therapy, taking medications as prescribed Person educated: Patient Education method: Explanation Education comprehension: verbalized understanding  HOME EXERCISE PROGRAM:   ASSESSMENT:  CLINICAL IMPRESSION: Patient was introduced to multiple new interventions for improved periscapular stability and cervical mobility. She required minimal cueing with today's new interventions  for proper exercise performance. She experienced reduced pain and improved mobility throughout today's interventions. She reported feeling better upon the conclusion of treatment. She continues to require skilled physical therapy to address her  remaining impairments to return to her prior level of function.   OBJECTIVE IMPAIRMENTS: decreased mobility, decreased ROM, decreased strength, hypomobility, impaired tone, impaired UE functional use, postural dysfunction, and pain.   ACTIVITY LIMITATIONS: lifting and sleeping  PARTICIPATION LIMITATIONS: meal prep, cleaning, and laundry  PERSONAL FACTORS: Past/current experiences, Time since onset of injury/illness/exacerbation, and 3+ comorbidities: Osteoporosis, anxiety, arthritis,history of COVID-19, depression, and allergies are also affecting patient's functional outcome.   REHAB POTENTIAL: Good  CLINICAL DECISION MAKING: Evolving/moderate complexity  EVALUATION COMPLEXITY: Moderate   GOALS: Goals reviewed with patient? Yes  LONG TERM GOALS: Target date: 04/26/24  Patient will be independent with her HEP. Baseline:  Goal status: INITIAL  2.  Patient will be able to demonstrate at least 50 degrees of cervical rotation bilaterally for improved awareness of her surroundings. Baseline:  Goal status: INITIAL  3.  Patient will improve her right triceps strength to at least 4/5 for improved function reaching and picking up items at home. Baseline:  Goal status: INITIAL  PLAN:  PT FREQUENCY: 1-2x/week  PT DURATION: 4 weeks  PLANNED INTERVENTIONS: 02835- PT Re-evaluation, 97750- Physical Performance Testing, 97110-Therapeutic exercises, 97530- Therapeutic activity, [id number]- Neuromuscular re-education, 97535- Self Care, 02859- Manual therapy, [id number]- Electrical stimulation (unattended), [phone]- Traction (mechanical), 20560 (1-2 muscles), 20561 (3+ muscles)- Dry Needling, Patient/Family education, Joint mobilization, Spinal mobilization, Cryotherapy, and Moist heat  PLAN FOR NEXT SESSION: UBE, manual therapy, scapular retraction, upper extremity strengthening, and modalities as needed   Lacinda JAYSON Fass, PT 04/04/2024, 3:45 PM

## 2024-04-07 ENCOUNTER — Telehealth: Payer: Self-pay

## 2024-04-07 NOTE — Telephone Encounter (Signed)
 The patient appointment originally scheduled for 06/16/24 has been rescheduled to 07/06/24 due to the provider's updated office hours. A reminder letter has been sent to the patient.

## 2024-04-11 ENCOUNTER — Ambulatory Visit

## 2024-04-11 DIAGNOSIS — M79601 Pain in right arm: Secondary | ICD-10-CM

## 2024-04-11 DIAGNOSIS — M542 Cervicalgia: Secondary | ICD-10-CM | POA: Diagnosis not present

## 2024-04-11 NOTE — Therapy (Signed)
 OUTPATIENT PHYSICAL THERAPY CERVICAL TREATMENT   Patient Name: Brittany Hensley MRN: [id number] DOB:[birth year], 73 y.o., female Today's Date: 04/11/2024  END OF SESSION:  PT End of Session - 04/11/24 1303     Visit Number 3    Number of Visits 8    Date for PT Re-Evaluation 06/02/24    PT Start Time 1300    PT Stop Time 1354    PT Time Calculation (min) 54 min    Activity Tolerance Patient tolerated treatment well    Behavior During Therapy WFL for tasks assessed/performed           Past Medical History:  Diagnosis Date   Anemia    iron     Anxiety    Arthritis    COVID    2020 bad cold s/s lasted 1 week. 04/28/2021   Depression    Diverticulitis    Hx: of   GERD (gastroesophageal reflux disease)    at times for 15 yrs 04/28/2021   History of kidney stones    10 years ago   Hypothyroidism    Low iron     Pneumonia    2020   Rosacea    Hx: of   SCC (squamous cell carcinoma) 08/13/2021   in situ-left temple (CX35FU)   SCC (squamous cell carcinoma) 08/13/2021   well diff-right forehead (CX35FU)   Squamous cell carcinoma of skin 08/03/2021   in situ- left malar cheek (CX35FU)   Past Surgical History:  Procedure Laterality Date   APPENDECTOMY     BALLOON DILATION  06/16/2011   Procedure: BALLOON DILATION;  Surgeon: Oliva FORBES Boots, MD;  Location: [facility] ENDOSCOPY;  Service: Endoscopy;  Laterality: N/A;   BALLOON DILATION N/A 10/03/2013   Procedure: BALLOON DILATION;  Surgeon: Oliva FORBES Boots, MD;  Location: [facility] ENDOSCOPY;  Service: Endoscopy;  Laterality: N/A;   BALLOON DILATION N/A 08/10/2014   Procedure: BALLOON DILATION;  Surgeon: Oliva FORBES Boots, MD;  Location: [facility] ENDOSCOPY;  Service: Endoscopy;  Laterality: N/A;  from 12 - 15 cm dilation completed   BALLOON DILATION N/A 02/15/2015   Procedure: BALLOON DILATION;  Surgeon: Oliva Boots, MD;  Location: [facility] ENDOSCOPY;  Service: Endoscopy;  Laterality: N/A;   BALLOON DILATION N/A 11/12/2015   Procedure: BALLOON DILATION;   Surgeon: Oliva Boots, MD;  Location: [facility] ENDOSCOPY;  Service: Endoscopy;  Laterality: N/A;   BALLOON DILATION N/A 08/10/2016   Procedure: BALLOON DILATION;  Surgeon: Oliva Boots, MD;  Location: [facility] ENDOSCOPY;  Service: Endoscopy;  Laterality: N/A;   BALLOON DILATION N/A 08/07/2016   Procedure: BALLOON DILATION;  Surgeon: Oliva Boots, MD;  Location: [facility] ENDOSCOPY;  Service: Endoscopy;  Laterality: N/A;   BALLOON DILATION N/A 05/20/2017   Procedure: BALLOON DILATION;  Surgeon: Boots Oliva, MD;  Location: [facility] ENDOSCOPY;  Service: Endoscopy;  Laterality: N/A;   BALLOON DILATION N/A 12/02/2017   Procedure: BALLOON DILATION;  Surgeon: Boots Oliva, MD;  Location: [facility] ENDOSCOPY;  Service: Endoscopy;  Laterality: N/A;   BALLOON DILATION N/A 07/19/2018   Procedure: BALLOON DILATION;  Surgeon: Boots Oliva, MD;  Location: [facility] ENDOSCOPY;  Service: Endoscopy;  Laterality: N/A;   BALLOON DILATION N/A 01/23/2019   Procedure: BALLOON DILATION;  Surgeon: Boots Oliva, MD;  Location: [facility]OSCOPY;  Service: Endoscopy;  Laterality: N/A;   BALLOON DILATION N/A 07/13/2019   Procedure: BALLOON DILATION;  Surgeon: Boots Oliva, MD;  Location: [facility]OSCOPY;  Service: Endoscopy;  Laterality: N/A;   BALLOON DILATION N/A 02/02/2020   Procedure: BALLOON DILATION;  Surgeon: Boots,  Oliva, MD;  Location: [facility]COPY;  Service: Endoscopy;  Laterality: N/A;   BALLOON DILATION N/A 07/16/2020   Procedure: BALLOON DILATION;  Surgeon: Rosalie Oliva, MD;  Location: [facility] ENDOSCOPY;  Service: Endoscopy;  Laterality: N/A;   BALLOON DILATION N/A 02/19/2021   Procedure: BALLOON DILATION;  Surgeon: Rosalie Oliva, MD;  Location: [facility]OSCOPY;  Service: Endoscopy;  Laterality: N/A;   BALLOON DILATION N/A 09/30/2021   Procedure: BALLOON DILATION;  Surgeon: Rosalie Oliva, MD;  Location: [facility]OSCOPY;  Service: Endoscopy;  Laterality: N/A;   BALLOON DILATION N/A 03/19/2022   Procedure: BALLOON DILATION;  Surgeon: Rosalie Oliva, MD;  Location: [facility]OSCOPY;   Service: Gastroenterology;  Laterality: N/A;   BALLOON DILATION N/A 05/05/2023   Procedure: BALLOON DILATION;  Surgeon: Rosalie Oliva, MD;  Location: [facility] ENDOSCOPY;  Service: Gastroenterology;  Laterality: N/A;   CATARACT EXTRACTION, BILATERAL     2019   CHOLECYSTECTOMY OPEN  1979   COLONOSCOPY     Hx: of   CYSTOSCOPY/URETEROSCOPY/HOLMIUM LASER/STENT PLACEMENT Bilateral 05/06/2021   Procedure: CYSTOSCOPY BILATERAL RETROGRADE LEFT URETEROSCOPY/HOLMIUM LASER/STENT PLACEMENT;  Surgeon: Watt Rush, MD;  Location: [facility];  Service: Urology;  Laterality: Bilateral;   ESOPHAGOGASTRODUODENOSCOPY  06/16/2011   Procedure: ESOPHAGOGASTRODUODENOSCOPY (EGD);  Surgeon: Oliva FORBES Rosalie, MD;  Location: [facility] ENDOSCOPY;  Service: Endoscopy;  Laterality: N/A;   ESOPHAGOGASTRODUODENOSCOPY N/A 10/03/2013   Procedure: ESOPHAGOGASTRODUODENOSCOPY (EGD);  Surgeon: Oliva FORBES Rosalie, MD;  Location: [facility];  Service: Endoscopy;  Laterality: N/A;   ESOPHAGOGASTRODUODENOSCOPY N/A 08/10/2014   Procedure: ESOPHAGOGASTRODUODENOSCOPY (EGD);  Surgeon: Oliva FORBES Rosalie, MD;  Location: [facility];  Service: Endoscopy;  Laterality: N/A;   ESOPHAGOGASTRODUODENOSCOPY N/A 08/10/2016   Procedure: ESOPHAGOGASTRODUODENOSCOPY (EGD);  Surgeon: Oliva Rosalie, MD;  Location[facility] ENDOSCOPY;  Service: Endoscopy;  Laterality: N/A;   ESOPHAGOGASTRODUODENOSCOPY N/A 07/19/2018   Procedure: ESOPHAGOGASTRODUODENOSCOPY (EGD);  Surgeon: Rosalie Oliva, MD;  Location: [facility];  Service: Endoscopy;  Laterality: N/A;   ESOPHAGOGASTRODUODENOSCOPY (EGD) WITH PROPOFOL  N/A 02/15/2015   Procedure: ESOPHAGOGASTRODUODENOSCOPY (EGD) WITH PROPOFOL ;  Surgeon: Oliva Rosalie, MD;  Location: [facility] ENDOSCOPY;  Service: Endoscopy;  Laterality: N/A;   ESOPHAGOGASTRODUODENOSCOPY (EGD) WITH PROPOFOL  N/A 11/12/2015   Procedure: ESOPHAGOGASTRODUODENOSCOPY (EGD) WITH PROPOFOL ;  Surgeon: Oliva Rosalie, MD;  Location: [facility] ENDOSCOPY;  Service: Endoscopy;  Laterality: N/A;    ESOPHAGOGASTRODUODENOSCOPY (EGD) WITH PROPOFOL  N/A 08/07/2016   Procedure: ESOPHAGOGASTRODUODENOSCOPY (EGD) WITH PROPOFOL ;  Surgeon: Oliva Rosalie, MD;  Location: [facility] ENDOSCOPY;  Service: Endoscopy;  Laterality: N/A;   ESOPHAGOGASTRODUODENOSCOPY (EGD) WITH PROPOFOL  N/A 05/20/2017   Procedure: ESOPHAGOGASTRODUODENOSCOPY (EGD) WITH PROPOFOL ;  Surgeon: Rosalie Oliva, MD;  Location: [facility] ENDOSCOPY;  Service: Endoscopy;  Laterality: N/A;   ESOPHAGOGASTRODUODENOSCOPY (EGD) WITH PROPOFOL  N/A 12/02/2017   Procedure: ESOPHAGOGASTRODUODENOSCOPY (EGD) WITH PROPOFOL ;  Surgeon: Rosalie Oliva, MD;  Location: [facility] ENDOSCOPY;  Service: Endoscopy;  Laterality: N/A;  have c arm available   ESOPHAGOGASTRODUODENOSCOPY (EGD) WITH PROPOFOL  N/A 01/23/2019   Procedure: ESOPHAGOGASTRODUODENOSCOPY (EGD) WITH PROPOFOL ;  Surgeon: Rosalie Oliva, MD;  Location: [facility] ENDOSCOPY;  Service: Endoscopy;  Laterality: N/A;   ESOPHAGOGASTRODUODENOSCOPY (EGD) WITH PROPOFOL  N/A 07/13/2019   Procedure: ESOPHAGOGASTRODUODENOSCOPY (EGD) WITH PROPOFOL ;  Surgeon: Rosalie Oliva, MD;  Location: [facility] ENDOSCOPY;  Service: Endoscopy;  Laterality: N/A;   ESOPHAGOGASTRODUODENOSCOPY (EGD) WITH PROPOFOL  N/A 02/02/2020   Procedure: ESOPHAGOGASTRODUODENOSCOPY (EGD) WITH PROPOFOL ;  Surgeon: Rosalie Oliva, MD;  Location: [facility];  Service: Endoscopy;  Laterality: N/A;   ESOPHAGOGASTRODUODENOSCOPY (EGD) WITH PROPOFOL  N/A 07/16/2020   Procedure: ESOPHAGOGASTRODUODENOSCOPY (EGD) WITH PROPOFOL ;  Surgeon: Rosalie Oliva, MD;  Location: [facility] ENDOSCOPY;  Service: Endoscopy;  Laterality: N/A;   ESOPHAGOGASTRODUODENOSCOPY (EGD) WITH PROPOFOL  N/A 02/19/2021   Procedure: ESOPHAGOGASTRODUODENOSCOPY (EGD) WITH PROPOFOL ;  Surgeon: Rosalie Kitchens, MD;  Location: [facility] ENDOS[facility];  Service: Endoscopy;  Laterality: N/A;  with possible botox    ESOPHAGOGASTRODUODENOSCOPY (EGD) WITH PROPOFOL  N/A 09/30/2021   Procedure: ESOPHAGOGASTRODUODENOSCOPY (EGD) WITH PROPOFOL ;  Surgeon: Rosalie Kitchens, MD;  Location: [facility]  ENDOSCOPY;  Service: Endoscopy;  Laterality: N/A;   ESOPHAGOGASTRODUODENOSCOPY (EGD) WITH PROPOFOL  N/A 03/19/2022   Procedure: ESOPHAGOGASTRODUODENOSCOPY (EGD) WITH PROPOFOL ;  Surgeon: Rosalie Kitchens, MD;  Location: [facility] ENDOSCOPY;  Service: Gastroenterology;  Laterality: N/A;   ESOPHAGOGASTRODUODENOSCOPY (EGD) WITH PROPOFOL  N/A 05/05/2023   Procedure: ESOPHAGOGASTRODUODENOSCOPY (EGD) WITH PROPOFOL ;  Surgeon: Rosalie Kitchens, MD;  Location: [facility] ENDOSCOPY;  Service: Gastroenterology;  Laterality: N/A;  with balloon dilation   FOREIGN BODY REMOVAL  07/19/2018   Procedure: FOREIGN BODY REMOVAL;  Surgeon: Rosalie Kitchens, MD;  Location: [facility] ENDOSCOPY;  Service: Endoscopy;;   FRACTURE SURGERY     Hx: of left wrist 01/2021   GASTRECTOMY  [phone]   GASTRECTOMY N/A    X3   LITHOTRIPSY     Hx; of for kidney stones [id number]   SAVORY DILATION N/A 08/10/2014   Procedure: SAVORY DILATION;  Surgeon: Kitchens FORBES Rosalie, MD;  Location: [facility] ENDOSCOPY;  Service: Endoscopy;  Laterality: N/A;   TOTAL HIP ARTHROPLASTY Right 12/30/2017   Procedure: RIGHT TOTAL HIP ARTHROPLASTY;  Surgeon: Margrette Taft FORBES, MD;  Location: [facility];  Service: Orthopedics;  Laterality: Right;   TOTAL HIP ARTHROPLASTY Left 02/17/2018   Procedure: TOTAL HIP ARTHROPLASTY;  Surgeon: Margrette Taft FORBES, MD;  Location: [facility];  Service: Orthopedics;  Laterality: Left;   TUBAL LIGATION     1975   Patient Active Problem List   Diagnosis Date Noted   Stricture of esophagus 08/21/2022   Closed nondisplaced oblique fracture of shaft of ulna with routine healing 03/20/2021   Closed fracture of lower end of right ulna 01/11/2021   Oral phase dysphagia 12/19/2020   Slow transit constipation 12/19/2020   Weight decreased 12/19/2020   Acquired hypertrophic pyloric stenosis 12/19/2020   Osteoporosis without pathological fracture 06/18/2020   Anemia, unspecified 06/27/2018   Age-related osteoporosis with current pathological fracture of right femur (HCC)  03/01/2018   Status post total hip replacement, left 02/17/18 02/17/2018   Closed displaced fracture of left femoral neck (HCC) 02/14/2018   S/P hip replacement, right 12/30/17 01/13/2018   Closed displaced fracture of right femoral neck (HCC) 12/30/2017   BMI less than 19,adult 01/10/2016   GAD (generalized anxiety disorder) 01/10/2016   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   Hypothyroidism 04/24/2013   GERD (gastroesophageal reflux disease) 04/24/2013   Rosacea 04/24/2013   Iron  deficiency anemia due to chronic blood loss 06/09/2012   Gastric outlet obstruction 06/16/2011   Atrioventricular nodal re-entry tachycardia (HCC) 03/11/2010   MURMUR 03/11/2010   PERSONAL HISTORY OF URINARY CALCULI 02/14/2008   REFERRING PROVIDER: Georgina Ozell LABOR, MD   REFERRING DIAG: Cervicalgia   THERAPY DIAG:  Cervicalgia  Pain in right arm  Rationale for Evaluation and Treatment: Rehabilitation  ONSET DATE: 6-7 months  SUBJECTIVE:  SUBJECTIVE STATEMENT: Pt report minimal neck and shoulder pain today.  Hand dominance: Right  PERTINENT HISTORY:  Osteoporosis, anxiety, arthritis,history of COVID-19, depression, and allergies  PAIN:  Are you having pain? Yes: NPRS scale: no number given/10 Pain location: Bil neck   PRECAUTIONS: None  RED FLAGS: None    WEIGHT BEARING RESTRICTIONS: No  FALLS:  Has patient fallen in last 6 months? No  LIVING ENVIRONMENT: Lives with: lives alone Lives in: House/apartment  OCCUPATION: retired   PLOF: Independent  PATIENT GOALS: reduced pain and improved mobility  NEXT MD VISIT: only as needed  OBJECTIVE:  Note: Objective measures were completed at Evaluation unless otherwise noted.  DIAGNOSTIC FINDINGS: 03/22/24 cervical x-ray grade 1  spondylolisthesis at C5/6.  No evidence of  instability on flexion/extension views.  Disc height loss with anterior osteophyte formation at C5/6 and C6/7.  No fracture or dislocation seen.   PATIENT SURVEYS:  NDI:  NECK DISABILITY INDEX  Date: 03/29/24 Score  Pain intensity 1 = The pain is very mild at the moment  2. Personal care (washing, dressing, etc.) 0 = I can look after myself normally without causing extra pain  3. Lifting 1 =  I can lift heavy weights but it gives extra pain  4. Reading 1 = I can read as much as I want to with slight pain in my neck  5. Headaches 0 = I have no headaches at all  6. Concentration 0 =  I can concentrate fully when I want to with no difficulty  7. Work 1 =  I can only do my usual work, but no more  8. Driving 0 = I can drive my car without any neck pain  9. Sleeping 0 = I have no trouble sleeping  10. Recreation 0 = I am able to engage in all my recreation activities with no neck pain at all  Total 4/50   Minimum Detectable Change (90% confidence): 5 points or 10% points  COGNITION: Overall cognitive status: Within functional limits for tasks assessed  SENSATION: Patient reports no numbness or tingling  POSTURE: rounded shoulders and forward head  PALPATION: TTP: bilateral scapular stabilizers, right upper trapezius, levator scapulae, and cervical paraspinals   CERVICAL ROM: significant difficulty isolating cervical rotation both directions  Active ROM A/PROM (deg) eval  Flexion 48  Extension 42; sore  Right lateral flexion 32; tension   Left lateral flexion 36  Right rotation 40  Left rotation 45   (Blank rows = not tested)  UPPER EXTREMITY ROM: WFL for activities assessed  UPPER EXTREMITY MMT:  MMT Right eval Left eval  Shoulder flexion 4/5 4/5  Shoulder extension    Shoulder abduction 4/5 4/5  Shoulder adduction    Shoulder extension    Shoulder internal rotation    Shoulder external rotation    Middle trapezius     Lower trapezius    Elbow flexion 4/5 4+/5  Elbow extension 3+/5 4/5  Wrist flexion    Wrist extension    Wrist ulnar deviation    Wrist radial deviation    Wrist pronation    Wrist supination    Grip strength 15 18   (Blank rows = not tested)  CERVICAL SPECIAL TESTS:  Spurling's test: Negative and Sharp pursor's test: Negative  TREATMENT DATE:  04/11/24    EXERCISE LOG  Exercise Repetitions and Resistance Comments  UBE  10 minutes @ 120 RPM   Pulley 5 mins   Resisted row  Green t-band x 20 reps   Resisted pull down   Green t-band x 20 reps    Therabar bending   Up and down   Bicep curl     Bilateral shoulder ER   With tactile cueing to prevent shoulder ABD  Isometric ball squeeze   For shoulder IR   Cervical SNAG's  For rotation   Blank cell = exercise not performed today   Manual Therapy Soft Tissue Mobilization: Cervical, STW/M to bil cervical paraspinals and levator scap to decrease pain and tone   Modalities  Date:  Unattended Estim: Cervical, Pre Mod 80-150 Hz, 15 mins, Pain and Tone Hot Pack: Cervical, 15 mins, Pain and Tone   PATIENT EDUCATION:  Education details: plan for physical therapy, taking medications as prescribed Person educated: Patient Education method: Explanation Education comprehension: verbalized understanding  HOME EXERCISE PROGRAM:   ASSESSMENT:  CLINICAL IMPRESSION: Pt arrives for today's treatment session reporting minimal cervical pain.  Reviewed exercises performed at last treatment session with pt requesting increased focus on neck pain today.  Per pt request STW/M performed to bil cervical paraspinals and levator scap to decrease pain and tone.  Normal responses to estim and MH noted upon removal.  Pt denied any pain at completion of today's treatment session.    OBJECTIVE IMPAIRMENTS: decreased mobility,  decreased ROM, decreased strength, hypomobility, impaired tone, impaired UE functional use, postural dysfunction, and pain.   ACTIVITY LIMITATIONS: lifting and sleeping  PARTICIPATION LIMITATIONS: meal prep, cleaning, and laundry  PERSONAL FACTORS: Past/current experiences, Time since onset of injury/illness/exacerbation, and 3+ comorbidities: Osteoporosis, anxiety, arthritis,history of COVID-19, depression, and allergies are also affecting patient's functional outcome.   REHAB POTENTIAL: Good  CLINICAL DECISION MAKING: Evolving/moderate complexity  EVALUATION COMPLEXITY: Moderate   GOALS: Goals reviewed with patient? Yes  LONG TERM GOALS: Target date: 04/26/24  Patient will be independent with her HEP. Baseline:  Goal status: INITIAL  2.  Patient will be able to demonstrate at least 50 degrees of cervical rotation bilaterally for improved awareness of her surroundings. Baseline:  Goal status: INITIAL  3.  Patient will improve her right triceps strength to at least 4/5 for improved function reaching and picking up items at home. Baseline:  Goal status: INITIAL  PLAN:  PT FREQUENCY: 1-2x/week  PT DURATION: 4 weeks  PLANNED INTERVENTIONS: 02835- PT Re-evaluation, 97750- Physical Performance Testing, 97110-Therapeutic exercises, 97530- Therapeutic activity, [id number]- Neuromuscular re-education, 97535- Self Care, 02859- Manual therapy, [id number]- Electrical stimulation (unattended), [phone]- Traction (mechanical), 20560 (1-2 muscles), 20561 (3+ muscles)- Dry Needling, Patient/Family education, Joint mobilization, Spinal mobilization, Cryotherapy, and Moist heat  PLAN FOR NEXT SESSION: UBE, manual therapy, scapular retraction, upper extremity strengthening, and modalities as needed   Delon DELENA Gosling, PTA 04/11/2024, 1:58 PM

## 2024-04-14 ENCOUNTER — Ambulatory Visit: Admitting: Physical Therapy

## 2024-04-14 DIAGNOSIS — M542 Cervicalgia: Secondary | ICD-10-CM

## 2024-04-14 DIAGNOSIS — M79601 Pain in right arm: Secondary | ICD-10-CM

## 2024-04-14 NOTE — Therapy (Signed)
 OUTPATIENT PHYSICAL THERAPY CERVICAL TREATMENT   Patient Name: Brittany Hensley MRN: [id number] DOB:[birth year], 73 y.o., female Today's Date: 04/14/2024  END OF SESSION:  PT End of Session - 04/14/24 1212     Visit Number 4    Number of Visits 8    Date for PT Re-Evaluation 06/02/24    PT Start Time 1100    PT Stop Time 1152    PT Time Calculation (min) 52 min    Activity Tolerance Patient tolerated treatment well    Behavior During Therapy WFL for tasks assessed/performed            Past Medical History:  Diagnosis Date   Anemia    iron     Anxiety    Arthritis    COVID    2020 bad cold s/s lasted 1 week. 04/28/2021   Depression    Diverticulitis    Hx: of   GERD (gastroesophageal reflux disease)    at times for 15 yrs 04/28/2021   History of kidney stones    10 years ago   Hypothyroidism    Low iron     Pneumonia    2020   Rosacea    Hx: of   SCC (squamous cell carcinoma) 08/13/2021   in situ-left temple (CX35FU)   SCC (squamous cell carcinoma) 08/13/2021   well diff-right forehead (CX35FU)   Squamous cell carcinoma of skin 08/03/2021   in situ- left malar cheek (CX35FU)   Past Surgical History:  Procedure Laterality Date   APPENDECTOMY     BALLOON DILATION  06/16/2011   Procedure: BALLOON DILATION;  Surgeon: Oliva FORBES Boots, MD;  Location: [facility] ENDOSCOPY;  Service: Endoscopy;  Laterality: N/A;   BALLOON DILATION N/A 10/03/2013   Procedure: BALLOON DILATION;  Surgeon: Oliva FORBES Boots, MD;  Location: [facility] ENDOSCOPY;  Service: Endoscopy;  Laterality: N/A;   BALLOON DILATION N/A 08/10/2014   Procedure: BALLOON DILATION;  Surgeon: Oliva FORBES Boots, MD;  Location: [facility] ENDOSCOPY;  Service: Endoscopy;  Laterality: N/A;  from 12 - 15 cm dilation completed   BALLOON DILATION N/A 02/15/2015   Procedure: BALLOON DILATION;  Surgeon: Oliva Boots, MD;  Location: [facility] ENDOSCOPY;  Service: Endoscopy;  Laterality: N/A;   BALLOON DILATION N/A 11/12/2015   Procedure: BALLOON DILATION;   Surgeon: Oliva Boots, MD;  Location: [facility] ENDOSCOPY;  Service: Endoscopy;  Laterality: N/A;   BALLOON DILATION N/A 08/10/2016   Procedure: BALLOON DILATION;  Surgeon: Oliva Boots, MD;  Location: [facility] ENDOSCOPY;  Service: Endoscopy;  Laterality: N/A;   BALLOON DILATION N/A 08/07/2016   Procedure: BALLOON DILATION;  Surgeon: Oliva Boots, MD;  Location: [facility] ENDOSCOPY;  Service: Endoscopy;  Laterality: N/A;   BALLOON DILATION N/A 05/20/2017   Procedure: BALLOON DILATION;  Surgeon: Boots Oliva, MD;  Location: [facility] ENDOSCOPY;  Service: Endoscopy;  Laterality: N/A;   BALLOON DILATION N/A 12/02/2017   Procedure: BALLOON DILATION;  Surgeon: Boots Oliva, MD;  Location: [facility] ENDOSCOPY;  Service: Endoscopy;  Laterality: N/A;   BALLOON DILATION N/A 07/19/2018   Procedure: BALLOON DILATION;  Surgeon: Boots Oliva, MD;  Location: [facility] ENDOSCOPY;  Service: Endoscopy;  Laterality: N/A;   BALLOON DILATION N/A 01/23/2019   Procedure: BALLOON DILATION;  Surgeon: Boots Oliva, MD;  Location: [facility] ENDOSCOPY;  Service: Endoscopy;  Laterality: N/A;   BALLOON DILATION N/A 07/13/2019   Procedure: BALLOON DILATION;  Surgeon: Boots Oliva, MD;  Location: [facility];  Service: Endoscopy;  Laterality: N/A;   BALLOON DILATION N/A 02/02/2020   Procedure: BALLOON DILATION;  Surgeon:  Rosalie Kitchens, MD;  Location: [facility];  Service: Endoscopy;  Laterality: N/A;   BALLOON DILATION N/A 07/16/2020   Procedure: BALLOON DILATION;  Surgeon: Rosalie Kitchens, MD;  Location: [facility] ENDOSCOPY;  Service: Endoscopy;  Laterality: N/A;   BALLOON DILATION N/A 02/19/2021   Procedure: BALLOON DILATION;  Surgeon: Rosalie Kitchens, MD;  Location: [facility]OSCOPY;  Service: Endoscopy;  Laterality: N/A;   BALLOON DILATION N/A 09/30/2021   Procedure: BALLOON DILATION;  Surgeon: Rosalie Kitchens, MD;  Location: [facility];  Service: Endoscopy;  Laterality: N/A;   BALLOON DILATION N/A 03/19/2022   Procedure: BALLOON DILATION;  Surgeon: Rosalie Kitchens, MD;  Location: [facility];   Service: Gastroenterology;  Laterality: N/A;   BALLOON DILATION N/A 05/05/2023   Procedure: BALLOON DILATION;  Surgeon: Rosalie Kitchens, MD;  Location: [facility];  Service: Gastroenterology;  Laterality: N/A;   CATARACT EXTRACTION, BILATERAL     2019   CHOLECYSTECTOMY OPEN  1979   COLONOSCOPY     Hx: of   CYSTOSCOPY/URETEROSCOPY/HOLMIUM LASER/STENT PLACEMENT Bilateral 05/06/2021   Procedure: CYSTOSCOPY BILATERAL RETROGRADE LEFT URETEROSCOPY/HOLMIUM LASER/STENT PLACEMENT;  Surgeon: Watt Rush, MD;  Location: [facility];  Service: Urology;  Laterality: Bilateral;   ESOPHAGOGASTRODUODENOSCOPY  06/16/2011   Procedure: ESOPHAGOGASTRODUODENOSCOPY (EGD);  Surgeon: Kitchens FORBES Rosalie, MD;  Location: [facility] ENDOSCOPY;  Service: Endoscopy;  Laterality: N/A;   ESOPHAGOGASTRODUODENOSCOPY N/A 10/03/2013   Procedure: ESOPHAGOGASTRODUODENOSCOPY (EGD);  Surgeon: Kitchens FORBES Rosalie, MD;  Location: [facility];  Service: Endoscopy;  Laterality: N/A;   ESOPHAGOGASTRODUODENOSCOPY N/A 08/10/2014   Procedure: ESOPHAGOGASTRODUODENOSCOPY (EGD);  Surgeon: Kitchens FORBES Rosalie, MD;  Location: [facility];  Service: Endoscopy;  Laterality: N/A;   ESOPHAGOGASTRODUODENOSCOPY N/A 08/10/2016   Procedure: ESOPHAGOGASTRODUODENOSCOPY (EGD);  Surgeon: Kitchens Rosalie, MD;  Location: [facility] ENDOSCOPY;  Service: Endoscopy;  Laterality: N/A;   ESOPHAGOGASTRODUODENOSCOPY N/A 07/19/2018   Procedure: ESOPHAGOGASTRODUODENOSCOPY (EGD);  Surgeon: Rosalie Kitchens, MD;  Location: [facility];  Service: Endoscopy;  Laterality: N/A;   ESOPHAGOGASTRODUODENOSCOPY (EGD) WITH PROPOFOL  N/A 02/15/2015   Procedure: ESOPHAGOGASTRODUODENOSCOPY (EGD) WITH PROPOFOL ;  Surgeon: Kitchens Rosalie, MD;  Location: [facility]OSCOPY;  Service: Endoscopy;  Laterality: N/A;   ESOPHAGOGASTRODUODENOSCOPY (EGD) WITH PROPOFOL  N/A 11/12/2015   Procedure: ESOPHAGOGASTRODUODENOSCOPY (EGD) WITH PROPOFOL ;  Surgeon: Kitchens Rosalie, MD;  Location: [facility];  Service: Endoscopy;  Laterality: N/A;    ESOPHAGOGASTRODUODENOSCOPY (EGD) WITH PROPOFOL  N/A 08/07/2016   Procedure: ESOPHAGOGASTRODUODENOSCOPY (EGD) WITH PROPOFOL ;  Surgeon: Kitchens Rosalie, MD;  Location: [facility] ENDOSCOPY;  Service: Endoscopy;  Laterality: N/A;   ESOPHAGOGASTRODUODENOSCOPY (EGD) WITH PROPOFOL  N/A 05/20/2017   Procedure: ESOPHAGOGASTRODUODENOSCOPY (EGD) WITH PROPOFOL ;  Surgeon: Rosalie Kitchens, MD;  Location: [facility];  Service: Endoscopy;  Laterality: N/A;   ESOPHAGOGASTRODUODENOSCOPY (EGD) WITH PROPOFOL  N/A 12/02/2017   Procedure: ESOPHAGOGASTRODUODENOSCOPY (EGD) WITH PROPOFOL ;  Surgeon: Rosalie Kitchens, MD;  Location: [facility] ENDOSCOPY;  Service: Endoscopy;  Laterality: N/A;  have c arm available   ESOPHAGOGASTRODUODENOSCOPY (EGD) WITH PROPOFOL  N/A 01/23/2019   Procedure: ESOPHAGOGASTRODUODENOSCOPY (EGD) WITH PROPOFOL ;  Surgeon: Rosalie Kitchens, MD;  Location: [facility];  Service: Endoscopy;  Laterality: N/A;   ESOPHAGOGASTRODUODENOSCOPY (EGD) WITH PROPOFOL  N/A 07/13/2019   Procedure: ESOPHAGOGASTRODUODENOSCOPY (EGD) WITH PROPOFOL ;  Surgeon: Rosalie Kitchens, MD;  Location: [facility];  Service: Endoscopy;  Laterality: N/A;   ESOPHAGOGASTRODUODENOSCOPY (EGD) WITH PROPOFOL  N/A 02/02/2020   Procedure: ESOPHAGOGASTRODUODENOSCOPY (EGD) WITH PROPOFOL ;  Surgeon: Rosalie Kitchens, MD;  Location: [facility];  Service: Endoscopy;  Laterality: N/A;   ESOPHAGOGASTRODUODENOSCOPY (EGD) WITH PROPOFOL  N/A 07/16/2020   Procedure: ESOPHAGOGASTRODUODENOSCOPY (EGD) WITH PROPOFOL ;  Surgeon: Rosalie Kitchens, MD;  Location: [facility];  Service: Endoscopy;  Laterality: N/A;   ESOPHAGOGASTRODUODENOSCOPY (EGD) WITH PROPOFOL  N/A 02/19/2021   Procedure: ESOPHAGOGASTRODUODENOSCOPY (EGD) WITH PROPOFOL ;  Surgeon: Rosalie Kitchens, MD;  Location: [facility];  Service: Endoscopy;  Laterality: N/A;  with possible botox    ESOPHAGOGASTRODUODENOSCOPY (EGD) WITH PROPOFOL  N/A 09/30/2021   Procedure: ESOPHAGOGASTRODUODENOSCOPY (EGD) WITH PROPOFOL ;  Surgeon: Rosalie Kitchens, MD;  Location: [facility]  ENDOSCOPY;  Service: Endoscopy;  Laterality: N/A;   ESOPHAGOGASTRODUODENOSCOPY (EGD) WITH PROPOFOL  N/A 03/19/2022   Procedure: ESOPHAGOGASTRODUODENOSCOPY (EGD) WITH PROPOFOL ;  Surgeon: Rosalie Kitchens, MD;  Location: [facility];  Service: Gastroenterology;  Laterality: N/A;   ESOPHAGOGASTRODUODENOSCOPY (EGD) WITH PROPOFOL  N/A 05/05/2023   Procedure: ESOPHAGOGASTRODUODENOSCOPY (EGD) WITH PROPOFOL ;  Surgeon: Rosalie Kitchens, MD;  Location: [facility] END[facility];  Service: Gastroenterology;  Laterality: N/A;  with balloon dilation   FOREIGN BODY REMOVAL  07/19/2018   Procedure: FOREIGN BODY REMOVAL;  Surgeon: Rosalie Kitchens, MD;  Location: [facility] ENDOSCOPY;  Service: Endoscopy;;   FRACTURE SURGERY     Hx: of left wrist 01/2021   GASTRECTOMY  [phone]   GASTRECTOMY N/A    X3   LITHOTRIPSY     Hx; of for kidney stones [id number]   SAVORY DILATION N/A 08/10/2014   Procedure: SAVORY DILATION;  Surgeon: Kitchens FORBES Rosalie, MD;  Location: [facility] ENDOS[facility];  Service: Endoscopy;  Laterality: N/A;   TOTAL HIP ARTHROPLASTY Right 12/30/2017   Procedure: RIGHT TOTAL HIP ARTHROPLASTY;  Surgeon: Margrette Taft FORBES, MD;  Location: [facility];  Service: Orthopedics;  Laterality: Right;   TOTAL HIP ARTHROPLASTY Left 02/17/2018   Procedure: TOTAL HIP ARTHROPLASTY;  Surgeon: Margrette Taft FORBES, MD;  Location: [facility];  Service: Orthopedics;  Laterality: Left;   TUBAL LIGATION     1975   Patient Active Problem List   Diagnosis Date Noted   Stricture of esophagus 08/21/2022   Closed nondisplaced oblique fracture of shaft of ulna with routine healing 03/20/2021   Closed fracture of lower end of right ulna 01/11/2021   Oral phase dysphagia 12/19/2020   Slow transit constipation 12/19/2020   Weight decreased 12/19/2020   Acquired hypertrophic pyloric stenosis 12/19/2020   Osteoporosis without pathological fracture 06/18/2020   Anemia, unspecified 06/27/2018   Age-related osteoporosis with current pathological fracture of right femur (HCC)  03/01/2018   Status post total hip replacement, left 02/17/18 02/17/2018   Closed displaced fracture of left femoral neck (HCC) 02/14/2018   S/P hip replacement, right 12/30/17 01/13/2018   Closed displaced fracture of right femoral neck (HCC) 12/30/2017   BMI less than 19,adult 01/10/2016   GAD (generalized anxiety disorder) 01/10/2016   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   Hypothyroidism 04/24/2013   GERD (gastroesophageal reflux disease) 04/24/2013   Rosacea 04/24/2013   Iron  deficiency anemia due to chronic blood loss 06/09/2012   Gastric outlet obstruction 06/16/2011   Atrioventricular nodal re-entry tachycardia (HCC) 03/11/2010   MURMUR 03/11/2010   PERSONAL HISTORY OF URINARY CALCULI 02/14/2008   REFERRING PROVIDER: Georgina Ozell LABOR, MD   REFERRING DIAG: Cervicalgia   THERAPY DIAG:  Cervicalgia  Pain in right arm  Rationale for Evaluation and Treatment: Rehabilitation  ONSET DATE: 6-7 months  SUBJECTIVE:  SUBJECTIVE STATEMENT: Now new complaints.  PERTINENT HISTORY:  Osteoporosis, anxiety, arthritis,history of COVID-19, depression, and allergies  PAIN:  Are you having pain? Yes: NPRS scale: no number given/10 Pain location: Bil neck   PRECAUTIONS: None  RED FLAGS: None    WEIGHT BEARING RESTRICTIONS: No  FALLS:  Has patient fallen in last 6 months? No  LIVING ENVIRONMENT: Lives with: lives alone Lives in: House/apartment  OCCUPATION: retired   PLOF: Independent  PATIENT GOALS: reduced pain and improved mobility  NEXT MD VISIT: only as needed  OBJECTIVE:  Note: Objective measures were completed at Evaluation unless otherwise noted.  DIAGNOSTIC FINDINGS: 03/22/24 cervical x-ray grade 1 spondylolisthesis at C5/6.  No evidence of  instability on  flexion/extension views.  Disc height loss with anterior osteophyte formation at C5/6 and C6/7.  No fracture or dislocation seen.   PATIENT SURVEYS:  NDI:  NECK DISABILITY INDEX  Date: 03/29/24 Score  Pain intensity 1 = The pain is very mild at the moment  2. Personal care (washing, dressing, etc.) 0 = I can look after myself normally without causing extra pain  3. Lifting 1 =  I can lift heavy weights but it gives extra pain  4. Reading 1 = I can read as much as I want to with slight pain in my neck  5. Headaches 0 = I have no headaches at all  6. Concentration 0 =  I can concentrate fully when I want to with no difficulty  7. Work 1 =  I can only do my usual work, but no more  8. Driving 0 = I can drive my car without any neck pain  9. Sleeping 0 = I have no trouble sleeping  10. Recreation 0 = I am able to engage in all my recreation activities with no neck pain at all  Total 4/50   Minimum Detectable Change (90% confidence): 5 points or 10% points  COGNITION: Overall cognitive status: Within functional limits for tasks assessed  SENSATION: Patient reports no numbness or tingling  POSTURE: rounded shoulders and forward head  PALPATION: TTP: bilateral scapular stabilizers, right upper trapezius, levator scapulae, and cervical paraspinals   CERVICAL ROM: significant difficulty isolating cervical rotation both directions  Active ROM A/PROM (deg) eval  Flexion 48  Extension 42; sore  Right lateral flexion 32; tension   Left lateral flexion 36  Right rotation 40  Left rotation 45   (Blank rows = not tested)  UPPER EXTREMITY ROM: WFL for activities assessed  UPPER EXTREMITY MMT:  MMT Right eval Left eval  Shoulder flexion 4/5 4/5  Shoulder extension    Shoulder abduction 4/5 4/5  Shoulder adduction    Shoulder extension    Shoulder internal rotation    Shoulder external rotation    Middle trapezius    Lower trapezius    Elbow flexion 4/5 4+/5  Elbow extension  3+/5 4/5  Wrist flexion    Wrist extension    Wrist ulnar deviation    Wrist radial deviation    Wrist pronation    Wrist supination    Grip strength 15 18   (Blank rows = not tested)  CERVICAL SPECIAL TESTS:  Spurling's test: Negative and Sharp pursor's test: Negative  TREATMENT DATE:        04/14/24:  UBE x 8 minutes f/b STW/M x 15 minutes to patient's bilateral UT's f/b HMP and IFC at 80-150 Hz on 40% scan x 20 minutes. Normal modality response following removal of modality.  04/11/24    EXERCISE LOG  Exercise Repetitions and Resistance Comments  UBE  10 minutes @ 120 RPM   Pulley 5 mins   Resisted row  Green t-band x 20 reps   Resisted pull down   Green t-band x 20 reps    Therabar bending   Up and down   Bicep curl     Bilateral shoulder ER   With tactile cueing to prevent shoulder ABD  Isometric ball squeeze   For shoulder IR   Cervical SNAG's  For rotation   Blank cell = exercise not performed today   Manual Therapy Soft Tissue Mobilization: Cervical, STW/M to bil cervical paraspinals and levator scap to decrease pain and tone   Modalities  Date:  Unattended Estim: Cervical, Pre Mod 80-150 Hz, 15 mins, Pain and Tone Hot Pack: Cervical, 15 mins, Pain and Tone   PATIENT EDUCATION:  Education details: plan for physical therapy, taking medications as prescribed Person educated: Patient Education method: Explanation Education comprehension: verbalized understanding  HOME EXERCISE PROGRAM:   ASSESSMENT:  CLINICAL IMPRESSION: Patient did well with treatment today, tolerating without compliant.  Good response to soft tissue work.    OBJECTIVE IMPAIRMENTS: decreased mobility, decreased ROM, decreased strength, hypomobility, impaired tone, impaired UE functional use, postural dysfunction, and pain.   ACTIVITY LIMITATIONS: lifting and  sleeping  PARTICIPATION LIMITATIONS: meal prep, cleaning, and laundry  PERSONAL FACTORS: Past/current experiences, Time since onset of injury/illness/exacerbation, and 3+ comorbidities: Osteoporosis, anxiety, arthritis,history of COVID-19, depression, and allergies are also affecting patient's functional outcome.   REHAB POTENTIAL: Good  CLINICAL DECISION MAKING: Evolving/moderate complexity  EVALUATION COMPLEXITY: Moderate   GOALS: Goals reviewed with patient? Yes  LONG TERM GOALS: Target date: 04/26/24  Patient will be independent with her HEP. Baseline:  Goal status: INITIAL  2.  Patient will be able to demonstrate at least 50 degrees of cervical rotation bilaterally for improved awareness of her surroundings. Baseline:  Goal status: INITIAL  3.  Patient will improve her right triceps strength to at least 4/5 for improved function reaching and picking up items at home. Baseline:  Goal status: INITIAL  PLAN:  PT FREQUENCY: 1-2x/week  PT DURATION: 4 weeks  PLANNED INTERVENTIONS: 97164- PT Re-evaluation, 97750- Physical Performance Testing, 97110-Therapeutic exercises, 97530- Therapeutic activity, [id number]- Neuromuscular re-education, 97535- Self Care, 02859- Manual therapy, [id number]- Electrical stimulation (unattended), 02987- Traction (mechanical), 20560 (1-2 muscles), 20561 (3+ muscles)- Dry Needling, Patient/Family education, Joint mobilization, Spinal mobilization, Cryotherapy, and Moist heat  PLAN FOR NEXT SESSION: UBE, manual therapy, scapular retraction, upper extremity strengthening, and modalities as needed   Evelyna Folker, [place], PT 04/14/2024, 12:17 PM

## 2024-04-17 ENCOUNTER — Ambulatory Visit: Admitting: Physical Therapy

## 2024-04-17 DIAGNOSIS — M542 Cervicalgia: Secondary | ICD-10-CM | POA: Diagnosis not present

## 2024-04-17 DIAGNOSIS — M62838 Other muscle spasm: Secondary | ICD-10-CM

## 2024-04-17 NOTE — Therapy (Signed)
 OUTPATIENT PHYSICAL THERAPY CERVICAL TREATMENT   Patient Name: Brittany Hensley MRN: [id number] DOB:1950/10/06, 73 y.o., female Today's Date: 04/17/2024  END OF SESSION:  PT End of Session - 04/17/24 1549     Visit Number 5    Number of Visits 8    Date for PT Re-Evaluation 06/02/24    PT Start Time 0309    PT Stop Time 0359    PT Time Calculation (min) 50 min    Activity Tolerance Patient tolerated treatment well    Behavior During Therapy [facility] for tasks assessed/performed            Past Medical History:  Diagnosis Date   Anemia    iron     Anxiety    Arthritis    COVID    2020 bad cold s/s lasted 1 week. 04/28/2021   Depression    Diverticulitis    Hx: of   GERD (gastroesophageal reflux disease)    at times for 15 yrs 04/28/2021   History of kidney stones    10 years ago   Hypothyroidism    Low iron     Pneumonia    2020   Rosacea    Hx: of   SCC (squamous cell carcinoma) 08/13/2021   in situ-left temple (CX35FU)   SCC (squamous cell carcinoma) 08/13/2021   well diff-right forehead (CX35FU)   Squamous cell carcinoma of skin 08/03/2021   in situ- left malar cheek (CX35FU)   Past Surgical History:  Procedure Laterality Date   APPENDECTOMY     BALLOON DILATION  06/16/2011   Procedure: BALLOON DILATION;  Surgeon: Oliva FORBES Boots, MD;  Location: [facility] ENDOSCOPY;  Service: Endoscopy;  Laterality: N/A;   BALLOON DILATION N/A 10/03/2013   Procedure: BALLOON DILATION;  Surgeon: Oliva FORBES Boots, MD;  Location: [facility] ENDOSCOPY;  Service: Endoscopy;  Laterality: N/A;   BALLOON DILATION N/A 08/10/2014   Procedure: BALLOON DILATION;  Surgeon: Oliva FORBES Boots, MD;  Location: [facility] ENDOSCOPY;  Service: Endoscopy;  Laterality: N/A;  from 12 - 15 cm dilation completed   BALLOON DILATION N/A 02/15/2015   Procedure: BALLOON DILATION;  Surgeon: Oliva Boots, MD;  Location: [facility] ENDOSCOPY;  Service: Endoscopy;  Laterality: N/A;   BALLOON DILATION N/A 11/12/2015   Procedure: BALLOON DILATION;   Surgeon: Oliva Boots, MD;  Location: [facility] ENDOSCOPY;  Service: Endoscopy;  Laterality: N/A;   BALLOON DILATION N/A 08/10/2016   Procedure: BALLOON DILATION;  Surgeon: Oliva Boots, MD;  Location: [facility] ENDOSCOPY;  Service: Endoscopy;  Laterality: N/A;   BALLOON DILATION N/A 08/07/2016   Procedure: BALLOON DILATION;  Surgeon: Oliva Boots, MD;  Location: [facility] ENDOSCOPY;  Service: Endoscopy;  Laterality: N/A;   BALLOON DILATION N/A 05/20/2017   Procedure: BALLOON DILATION;  Surgeon: Boots Oliva, MD;  Location: [facility]OSCOPY;  Service: Endoscopy;  Laterality: N/A;   BALLOON DILATION N/A 12/02/2017   Procedure: BALLOON DILATION;  Surgeon: Boots Oliva, MD;  Location: [facility] ENDOSCOPY;  Service: Endoscopy;  Laterality: N/A;   BALLOON DILATION N/A 07/19/2018   Procedure: BALLOON DILATION;  Surgeon: Boots Oliva, MD;  Location: [facility] ENDOSCOPY;  Service: Endoscopy;  Laterality: N/A;   BALLOON DILATION N/A 01/23/2019   Procedure: BALLOON DILATION;  Surgeon: Boots Oliva, MD;  Location: [facility]OSCOPY;  Service: Endoscopy;  Laterality: N/A;   BALLOON DILATION N/A 07/13/2019   Procedure: BALLOON DILATION;  Surgeon: Boots Oliva, MD;  Location: [facility]OSCOPY;  Service: Endoscopy;  Laterality: N/A;   BALLOON DILATION N/A 02/02/2020   Procedure: BALLOON DILATION;  Surgeon:  Rosalie Kitchens, MD;  Location: [facility]COPY;  Service: Endoscopy;  Laterality: N/A;   BALLOON DILATION N/A 07/16/2020   Procedure: BALLOON DILATION;  Surgeon: Rosalie Kitchens, MD;  Location: [facility] ENDOSCOPY;  Service: Endoscopy;  Laterality: N/A;   BALLOON DILATION N/A 02/19/2021   Procedure: BALLOON DILATION;  Surgeon: Rosalie Kitchens, MD;  Location: [facility]OSCOPY;  Service: Endoscopy;  Laterality: N/A;   BALLOON DILATION N/A 09/30/2021   Procedure: BALLOON DILATION;  Surgeon: Rosalie Kitchens, MD;  Location: [facility]OSCOPY;  Service: Endoscopy;  Laterality: N/A;   BALLOON DILATION N/A 03/19/2022   Procedure: BALLOON DILATION;  Surgeon: Rosalie Kitchens, MD;  Location: [facility]OSCOPY;   Service: Gastroenterology;  Laterality: N/A;   BALLOON DILATION N/A 05/05/2023   Procedure: BALLOON DILATION;  Surgeon: Rosalie Kitchens, MD;  Location: [facility]OSCOPY;  Service: Gastroenterology;  Laterality: N/A;   CATARACT EXTRACTION, BILATERAL     2019   CHOLECYSTECTOMY OPEN  1979   COLONOSCOPY     Hx: of   CYSTOSCOPY/URETEROSCOPY/HOLMIUM LASER/STENT PLACEMENT Bilateral 05/06/2021   Procedure: CYSTOSCOPY BILATERAL RETROGRADE LEFT URETEROSCOPY/HOLMIUM LASER/STENT PLACEMENT;  Surgeon: Watt Rush, MD;  Location: [facility];  Service: Urology;  Laterality: Bilateral;   ESOPHAGOGASTRODUODENOSCOPY  06/16/2011   Procedure: ESOPHAGOGASTRODUODENOSCOPY (EGD);  Surgeon: Kitchens FORBES Rosalie, MD;  Location: [facility] ENDOSCOPY;  Service: Endoscopy;  Laterality: N/A;   ESOPHAGOGASTRODUODENOSCOPY N/A 10/03/2013   Procedure: ESOPHAGOGASTRODUODENOSCOPY (EGD);  Surgeon: Kitchens FORBES Rosalie, MD;  Location: [facility];  Service: Endoscopy;  Laterality: N/A;   ESOPHAGOGASTRODUODENOSCOPY N/A 08/10/2014   Procedure: ESOPHAGOGASTRODUODENOSCOPY (EGD);  Surgeon: Kitchens FORBES Rosalie, MD;  Location: [facility];  Service: Endoscopy;  Laterality: N/A;   ESOPHAGOGASTRODUODENOSCOPY N/A 08/10/2016   Procedure: ESOPHAGOGASTRODUODENOSCOPY (EGD);  Surgeon: Kitchens Rosalie, MD;  Location: [facility] ENDOSCOPY;  Service: Endoscopy;  Laterality: N/A;   ESOPHAGOGASTRODUODENOSCOPY N/A 07/19/2018   Procedure: ESOPHAGOGASTRODUODENOSCOPY (EGD);  Surgeon: Rosalie Kitchens, MD;  Location: [facility];  Service: Endoscopy;  Laterality: N/A;   ESOPHAGOGASTRODUODENOSCOPY (EGD) WITH PROPOFOL  N/A 02/15/2015   Procedure: ESOPHAGOGASTRODUODENOSCOPY (EGD) WITH PROPOFOL ;  Surgeon: Kitchens Rosalie, MD;  Location: [facility] ENDOSCOPY;  Service: Endoscopy;  Laterality: N/A;   ESOPHAGOGASTRODUODENOSCOPY (EGD) WITH PROPOFOL  N/A 11/12/2015   Procedure: ESOPHAGOGASTRODUODENOSCOPY (EGD) WITH PROPOFOL ;  Surgeon: Kitchens Rosalie, MD;  Location: [facility]OSCOPY;  Service: Endoscopy;  Laterality: N/A;    ESOPHAGOGASTRODUODENOSCOPY (EGD) WITH PROPOFOL  N/A 08/07/2016   Procedure: ESOPHAGOGASTRODUODENOSCOPY (EGD) WITH PROPOFOL ;  Surgeon: Kitchens Rosalie, MD;  Location: [facility] ENDOSCOPY;  Service: Endoscopy;  Laterality: N/A;   ESOPHAGOGASTRODUODENOSCOPY (EGD) WITH PROPOFOL  N/A 05/20/2017   Procedure: ESOPHAGOGASTRODUODENOSCOPY (EGD) WITH PROPOFOL ;  Surgeon: Rosalie Kitchens, MD;  Location: [facility] ENDOSCOPY;  Service: Endoscopy;  Laterality: N/A;   ESOPHAGOGASTRODUODENOSCOPY (EGD) WITH PROPOFOL  N/A 12/02/2017   Procedure: ESOPHAGOGASTRODUODENOSCOPY (EGD) WITH PROPOFOL ;  Surgeon: Rosalie Kitchens, MD;  Location: [facility] ENDOSCOPY;  Service: Endoscopy;  Laterality: N/A;  have c arm available   ESOPHAGOGASTRODUODENOSCOPY (EGD) WITH PROPOFOL  N/A 01/23/2019   Procedure: ESOPHAGOGASTRODUODENOSCOPY (EGD) WITH PROPOFOL ;  Surgeon: Rosalie Kitchens, MD;  Location: [facility] ENDOSCOPY;  Service: Endoscopy;  Laterality: N/A;   ESOPHAGOGASTRODUODENOSCOPY (EGD) WITH PROPOFOL  N/A 07/13/2019   Procedure: ESOPHAGOGASTRODUODENOSCOPY (EGD) WITH PROPOFOL ;  Surgeon: Rosalie Kitchens, MD;  Location: [facility]OS[facility];  Service: Endoscopy;  Laterality: N/A;   ESOPHAGOGASTRODUODENOSCOPY (EGD) WITH PROPOFOL  N/A 02/02/2020   Procedure: ESOPHAGOGASTRODUODENOSCOPY (EGD) WITH PROPOFOL ;  Surgeon: Rosalie Kitchens, MD;  Location: [facility];  Service: Endoscopy;  Laterality: N/A;   ESOPHAGOGASTRODUODENOSCOPY (EGD) WITH PROPOFOL  N/A 07/16/2020   Procedure: ESOPHAGOGASTRODUODENOSCOPY (EGD) WITH PROPOFOL ;  Surgeon: Rosalie Kitchens, MD;  Location: [facility]OSCOPY;  Service: Endoscopy;  Laterality: N/A;   ESOPHAGOGASTRODUODENOSCOPY (EGD) WITH PROPOFOL  N/A 02/19/2021   Procedure: ESOPHAGOGASTRODUODENOSCOPY (EGD) WITH PROPOFOL ;  Surgeon: Rosalie Kitchens, MD;  Location: [facility];  Service: Endoscopy;  Laterality: N/A;  with possible botox    ESOPHAGOGASTRODUODENOSCOPY (EGD) WITH PROPOFOL  N/A 09/30/2021   Procedure: ESOPHAGOGASTRODUODENOSCOPY (EGD) WITH PROPOFOL ;  Surgeon: Rosalie Kitchens, MD;  Location: [facility]  ENDOSCOPY;  Service: Endoscopy;  Laterality: N/A;   ESOPHAGOGASTRODUODENOSCOPY (EGD) WITH PROPOFOL  N/A 03/19/2022   Procedure: ESOPHAGOGASTRODUODENOSCOPY (EGD) WITH PROPOFOL ;  Surgeon: Rosalie Kitchens, MD;  Location: [facility] ENDOSCOPY;  Service: Gastroenterology;  Laterality: N/A;   ESOPHAGOGASTRODUODENOSCOPY (EGD) WITH PROPOFOL  N/A 05/05/2023   Procedure: ESOPHAGOGASTRODUODENOSCOPY (EGD) WITH PROPOFOL ;  Surgeon: Rosalie Kitchens, MD;  Location: [facility] ENDOSCOPY;  Service: Gastroenterology;  Laterality: N/A;  with balloon dilation   FOREIGN BODY REMOVAL  07/19/2018   Procedure: FOREIGN BODY REMOVAL;  Surgeon: Rosalie Kitchens, MD;  Location: [facility] ENDOSCOPY;  Service: Endoscopy;;   FRACTURE SURGERY     Hx: of left wrist 01/2021   GASTRECTOMY  [phone]   GASTRECTOMY N/A    X3   LITHOTRIPSY     Hx; of for kidney stones [id number]   SAVORY DILATION N/A 08/10/2014   Procedure: SAVORY DILATION;  Surgeon: Kitchens FORBES Rosalie, MD;  Location: [facility] ENDOSCOPY;  Service: Endoscopy;  Laterality: N/A;   TOTAL HIP ARTHROPLASTY Right 12/30/2017   Procedure: RIGHT TOTAL HIP ARTHROPLASTY;  Surgeon: Margrette Taft FORBES, MD;  Location: [facility];  Service: Orthopedics;  Laterality: Right;   TOTAL HIP ARTHROPLASTY Left 02/17/2018   Procedure: TOTAL HIP ARTHROPLASTY;  Surgeon: Margrette Taft FORBES, MD;  Location: [facility];  Service: Orthopedics;  Laterality: Left;   TUBAL LIGATION     1975   Patient Active Problem List   Diagnosis Date Noted   Stricture of esophagus 08/21/2022   Closed nondisplaced oblique fracture of shaft of ulna with routine healing 03/20/2021   Closed fracture of lower end of right ulna 01/11/2021   Oral phase dysphagia 12/19/2020   Slow transit constipation 12/19/2020   Weight decreased 12/19/2020   Acquired hypertrophic pyloric stenosis 12/19/2020   Osteoporosis without pathological fracture 06/18/2020   Anemia, unspecified 06/27/2018   Age-related osteoporosis with current pathological fracture of right femur (HCC)  03/01/2018   Status post total hip replacement, left 02/17/18 02/17/2018   Closed displaced fracture of left femoral neck (HCC) 02/14/2018   S/P hip replacement, right 12/30/17 01/13/2018   Closed displaced fracture of right femoral neck (HCC) 12/30/2017   BMI less than 19,adult 01/10/2016   GAD (generalized anxiety disorder) 01/10/2016   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   Hypothyroidism 04/24/2013   GERD (gastroesophageal reflux disease) 04/24/2013   Rosacea 04/24/2013   Iron  deficiency anemia due to chronic blood loss 06/09/2012   Gastric outlet obstruction 06/16/2011   Atrioventricular nodal re-entry tachycardia (HCC) 03/11/2010   MURMUR 03/11/2010   PERSONAL HISTORY OF URINARY CALCULI 02/14/2008   REFERRING PROVIDER: Georgina Ozell LABOR, MD   REFERRING DIAG: Cervicalgia   THERAPY DIAG:  Cervicalgia  Other muscle spasm  Rationale for Evaluation and Treatment: Rehabilitation  ONSET DATE: 6-7 months  SUBJECTIVE:  SUBJECTIVE STATEMENT: Doing okay.  Get tingling in arms/hands.  Headache today. PERTINENT HISTORY:  Osteoporosis, anxiety, arthritis,history of COVID-19, depression, and allergies  PAIN:  Are you having pain? Yes: NPRS scale: no number given/10 Pain location: Bil neck   PRECAUTIONS: None  RED FLAGS: None    WEIGHT BEARING RESTRICTIONS: No  FALLS:  Has patient fallen in last 6 months? No  LIVING ENVIRONMENT: Lives with: lives alone Lives in: House/apartment  OCCUPATION: retired   PLOF: Independent  PATIENT GOALS: reduced pain and improved mobility  NEXT MD VISIT: only as needed  OBJECTIVE:  Note: Objective measures were completed at Evaluation unless otherwise noted.  DIAGNOSTIC FINDINGS: 03/22/24 cervical x-ray grade 1 spondylolisthesis at  C5/6.  No evidence of  instability on flexion/extension views.  Disc height loss with anterior osteophyte formation at C5/6 and C6/7.  No fracture or dislocation seen.   PATIENT SURVEYS:  NDI:  NECK DISABILITY INDEX  Date: 03/29/24 Score  Pain intensity 1 = The pain is very mild at the moment  2. Personal care (washing, dressing, etc.) 0 = I can look after myself normally without causing extra pain  3. Lifting 1 =  I can lift heavy weights but it gives extra pain  4. Reading 1 = I can read as much as I want to with slight pain in my neck  5. Headaches 0 = I have no headaches at all  6. Concentration 0 =  I can concentrate fully when I want to with no difficulty  7. Work 1 =  I can only do my usual work, but no more  8. Driving 0 = I can drive my car without any neck pain  9. Sleeping 0 = I have no trouble sleeping  10. Recreation 0 = I am able to engage in all my recreation activities with no neck pain at all  Total 4/50   Minimum Detectable Change (90% confidence): 5 points or 10% points  COGNITION: Overall cognitive status: Within functional limits for tasks assessed  SENSATION: Patient reports no numbness or tingling  POSTURE: rounded shoulders and forward head  PALPATION: TTP: bilateral scapular stabilizers, right upper trapezius, levator scapulae, and cervical paraspinals   CERVICAL ROM: significant difficulty isolating cervical rotation both directions  Active ROM A/PROM (deg) eval  Flexion 48  Extension 42; sore  Right lateral flexion 32; tension   Left lateral flexion 36  Right rotation 40  Left rotation 45   (Blank rows = not tested)  UPPER EXTREMITY ROM: WFL for activities assessed  UPPER EXTREMITY MMT:  MMT Right eval Left eval  Shoulder flexion 4/5 4/5  Shoulder extension    Shoulder abduction 4/5 4/5  Shoulder adduction    Shoulder extension    Shoulder internal rotation    Shoulder external rotation    Middle trapezius    Lower trapezius     Elbow flexion 4/5 4+/5  Elbow extension 3+/5 4/5  Wrist flexion    Wrist extension    Wrist ulnar deviation    Wrist radial deviation    Wrist pronation    Wrist supination    Grip strength 15 18   (Blank rows = not tested)  CERVICAL SPECIAL TESTS:  Spurling's test: Negative and Sharp pursor's test: Negative  TREATMENT DATE:    04/17/24:  Low-level combo e'stim/US  at 1.50 W/CM2 x 12 minutes (6 minutes to each UT) f/b STW/M x 12 minutes f/b  HMP and IFC at 80-150 Hz on 40% scan x 20  minutes. Normal modality response following removal of modality.                                                                                                                          04/14/24:  UBE x 8 minutes f/b STW/M x 15 minutes to patient's bilateral UT's f/b HMP and IFC at 80-150 Hz on 40% scan x 20 minutes. Normal modality response following removal of modality.                                                                                                                       04/11/24    EXERCISE LOG  Exercise Repetitions and Resistance Comments  UBE  10 minutes @ 120 RPM   Pulley 5 mins   Resisted row  Green t-band x 20 reps   Resisted pull down   Green t-band x 20 reps    Therabar bending   Up and down   Bicep curl     Bilateral shoulder ER   With tactile cueing to prevent shoulder ABD  Isometric ball squeeze   For shoulder IR   Cervical SNAG's  For rotation   Blank cell = exercise not performed today   Manual Therapy Soft Tissue Mobilization: Cervical, STW/M to bil cervical paraspinals and levator scap to decrease pain and tone   Modalities  Date:  Unattended Estim: Cervical, Pre Mod 80-150 Hz, 15 mins, Pain and Tone Hot Pack: Cervical, 15 mins, Pain and Tone   PATIENT EDUCATION:  Education details: plan for physical therapy, taking medications as prescribed Person educated: Patient Education method: Explanation Education comprehension: verbalized understanding  HOME EXERCISE  PROGRAM:   ASSESSMENT:  CLINICAL IMPRESSION: Patient enjoyed low-level combo e'stim/US  today.  She did well with treatment today and had no headache following.  OBJECTIVE IMPAIRMENTS: decreased mobility, decreased ROM, decreased strength, hypomobility, impaired tone, impaired UE functional use, postural dysfunction, and pain.   ACTIVITY LIMITATIONS: lifting and sleeping  PARTICIPATION LIMITATIONS: meal prep, cleaning, and laundry  PERSONAL FACTORS: Past/current experiences, Time since onset of injury/illness/exacerbation, and 3+ comorbidities: Osteoporosis, anxiety, arthritis,history of COVID-19, depression, and allergies are also affecting patient's functional outcome.   REHAB POTENTIAL: Good  CLINICAL DECISION MAKING: Evolving/moderate complexity  EVALUATION COMPLEXITY: Moderate   GOALS: Goals reviewed with patient? Yes  LONG TERM GOALS: Target date: 04/26/24  Patient will be independent with her HEP. Baseline:  Goal status: INITIAL  2.  Patient will be able to demonstrate at  least 50 degrees of cervical rotation bilaterally for improved awareness of her surroundings. Baseline:  Goal status: INITIAL  3.  Patient will improve her right triceps strength to at least 4/5 for improved function reaching and picking up items at home. Baseline:  Goal status: INITIAL  PLAN:  PT FREQUENCY: 1-2x/week  PT DURATION: 4 weeks  PLANNED INTERVENTIONS: 97164- PT Re-evaluation, 97750- Physical Performance Testing, 97110-Therapeutic exercises, 97530- Therapeutic activity, [id number]- Neuromuscular re-education, 97535- Self Care, 02859- Manual therapy, [id number]- Electrical stimulation (unattended), 02987- Traction (mechanical), 20560 (1-2 muscles), 20561 (3+ muscles)- Dry Needling, Patient/Family education, Joint mobilization, Spinal mobilization, Cryotherapy, and Moist heat  PLAN FOR NEXT SESSION: UBE, manual therapy, scapular retraction, upper extremity strengthening, and modalities as  needed   Safire Gordin, [place], PT 04/17/2024, 4:11 PM

## 2024-04-20 ENCOUNTER — Ambulatory Visit: Admitting: Nurse Practitioner

## 2024-04-20 ENCOUNTER — Other Ambulatory Visit: Payer: Self-pay | Admitting: Orthopedic Surgery

## 2024-04-21 ENCOUNTER — Ambulatory Visit

## 2024-04-21 ENCOUNTER — Ambulatory Visit: Admitting: Physical Therapy

## 2024-04-21 DIAGNOSIS — M542 Cervicalgia: Secondary | ICD-10-CM | POA: Diagnosis not present

## 2024-04-21 DIAGNOSIS — M62838 Other muscle spasm: Secondary | ICD-10-CM

## 2024-04-21 DIAGNOSIS — M79601 Pain in right arm: Secondary | ICD-10-CM

## 2024-04-21 NOTE — Therapy (Signed)
 OUTPATIENT PHYSICAL THERAPY CERVICAL TREATMENT   Patient Name: Brittany Hensley MRN: [id number] DOB:1951-06-19, 73 y.o., female Today's Date: 04/21/2024  END OF SESSION:  PT End of Session - 04/21/24 1219     Visit Number 6    Number of Visits 8    Date for Recertification  06/02/24    PT Start Time 1117    PT Stop Time 1211    PT Time Calculation (min) 54 min    Activity Tolerance Patient tolerated treatment well    Behavior During Therapy [facility] for tasks assessed/performed            Past Medical History:  Diagnosis Date   Anemia    iron     Anxiety    Arthritis    COVID    2020 bad cold s/s lasted 1 week. 04/28/2021   Depression    Diverticulitis    Hx: of   GERD (gastroesophageal reflux disease)    at times for 15 yrs 04/28/2021   History of kidney stones    10 years ago   Hypothyroidism    Low iron     Pneumonia    2020   Rosacea    Hx: of   SCC (squamous cell carcinoma) 08/13/2021   in situ-left temple (CX35FU)   SCC (squamous cell carcinoma) 08/13/2021   well diff-right forehead (CX35FU)   Squamous cell carcinoma of skin 08/03/2021   in situ- left malar cheek (CX35FU)   Past Surgical History:  Procedure Laterality Date   APPENDECTOMY     BALLOON DILATION  06/16/2011   Procedure: BALLOON DILATION;  Surgeon: Oliva FORBES Boots, MD;  Location: [facility] ENDOSCOPY;  Service: Endoscopy;  Laterality: N/A;   BALLOON DILATION N/A 10/03/2013   Procedure: BALLOON DILATION;  Surgeon: Oliva FORBES Boots, MD;  Location: [facility] ENDOSCOPY;  Service: Endoscopy;  Laterality: N/A;   BALLOON DILATION N/A 08/10/2014   Procedure: BALLOON DILATION;  Surgeon: Oliva FORBES Boots, MD;  Location: [facility] ENDOSCOPY;  Service: Endoscopy;  Laterality: N/A;  from 12 - 15 cm dilation completed   BALLOON DILATION N/A 02/15/2015   Procedure: BALLOON DILATION;  Surgeon: Oliva Boots, MD;  Location: [facility] ENDOSCOPY;  Service: Endoscopy;  Laterality: N/A;   BALLOON DILATION N/A 11/12/2015   Procedure: BALLOON DILATION;   Surgeon: Oliva Boots, MD;  Location: [facility] ENDOSCOPY;  Service: Endoscopy;  Laterality: N/A;   BALLOON DILATION N/A 08/10/2016   Procedure: BALLOON DILATION;  Surgeon: Oliva Boots, MD;  Location: [facility] ENDOSCOPY;  Service: Endoscopy;  Laterality: N/A;   BALLOON DILATION N/A 08/07/2016   Procedure: BALLOON DILATION;  Surgeon: Oliva Boots, MD;  Location: [facility] ENDOSCOPY;  Service: Endoscopy;  Laterality: N/A;   BALLOON DILATION N/A 05/20/2017   Procedure: BALLOON DILATION;  Surgeon: Boots Oliva, MD;  Location: [facility] ENDOSCOPY;  Service: Endoscopy;  Laterality: N/A;   BALLOON DILATION N/A 12/02/2017   Procedure: BALLOON DILATION;  Surgeon: Boots Oliva, MD;  Location: [facility] ENDOSCOPY;  Service: Endoscopy;  Laterality: N/A;   BALLOON DILATION N/A 07/19/2018   Procedure: BALLOON DILATION;  Surgeon: Boots Oliva, MD;  Location: [facility] ENDOSCOPY;  Service: Endoscopy;  Laterality: N/A;   BALLOON DILATION N/A 01/23/2019   Procedure: BALLOON DILATION;  Surgeon: Boots Oliva, MD;  Location: [facility] ENDOSCOPY;  Service: Endoscopy;  Laterality: N/A;   BALLOON DILATION N/A 07/13/2019   Procedure: BALLOON DILATION;  Surgeon: Boots Oliva, MD;  Location: [facility] ENDOSCOPY;  Service: Endoscopy;  Laterality: N/A;   BALLOON DILATION N/A 02/02/2020   Procedure: BALLOON DILATION;  Surgeon:  Rosalie Kitchens, MD;  Location: [facility]OSCOPY;  Service: Endoscopy;  Laterality: N/A;   BALLOON DILATION N/A 07/16/2020   Procedure: BALLOON DILATION;  Surgeon: Rosalie Kitchens, MD;  Location: [facility] ENDOSCOPY;  Service: Endoscopy;  Laterality: N/A;   BALLOON DILATION N/A 02/19/2021   Procedure: BALLOON DILATION;  Surgeon: Rosalie Kitchens, MD;  Location: [facility] ENDOSCOPY;  Service: Endoscopy;  Laterality: N/A;   BALLOON DILATION N/A 09/30/2021   Procedure: BALLOON DILATION;  Surgeon: Rosalie Kitchens, MD;  Location: [facility] ENDOSCOPY;  Service: Endoscopy;  Laterality: N/A;   BALLOON DILATION N/A 03/19/2022   Procedure: BALLOON DILATION;  Surgeon: Rosalie Kitchens, MD;  Location: [facility] ENDOSCOPY;   Service: Gastroenterology;  Laterality: N/A;   BALLOON DILATION N/A 05/05/2023   Procedure: BALLOON DILATION;  Surgeon: Rosalie Kitchens, MD;  Location: [facility] ENDOSCOPY;  Service: Gastroenterology;  Laterality: N/A;   CATARACT EXTRACTION, BILATERAL     2019   CHOLECYSTECTOMY OPEN  1979   COLONOSCOPY     Hx: of   CYSTOSCOPY/URETEROSCOPY/HOLMIUM LASER/STENT PLACEMENT Bilateral 05/06/2021   Procedure: CYSTOSCOPY BILATERAL RETROGRADE LEFT URETEROSCOPY/HOLMIUM LASER/STENT PLACEMENT;  Surgeon: Watt Rush, MD;  Location[facility];  Service: Urology;  Laterality: Bilateral;   ESOPHAGOGASTRODUODENOSCOPY  06/16/2011   Procedure: ESOPHAGOGASTRODUODENOSCOPY (EGD);  Surgeon: Kitchens FORBES Rosalie, MD;  Location: [facility] ENDOSCOPY;  Service: Endoscopy;  Laterality: N/A;   ESOPHAGOGASTRODUODENOSCOPY N/A 10/03/2013   Procedure: ESOPHAGOGASTRODUODENOSCOPY (EGD);  Surgeon: Kitchens FORBES Rosalie, MD;  Location: [facility];  Service: Endoscopy;  Laterality: N/A;   ESOPHAGOGASTRODUODENOSCOPY N/A 08/10/2014   Procedure: ESOPHAGOGASTRODUODENOSCOPY (EGD);  Surgeon: Kitchens FORBES Rosalie, MD;  Location: [facility];  Service: Endoscopy;  Laterality: N/A;   ESOPHAGOGASTRODUODENOSCOPY N/A 08/10/2016   Procedure: ESOPHAGOGASTRODUODENOSCOPY (EGD);  Surgeon: Kitchens Rosalie, MD;  Location: [facility] ENDOSCOPY;  Service: Endoscopy;  Laterality: N/A;   ESOPHAGOGASTRODUODENOSCOPY N/A 07/19/2018   Procedure: ESOPHAGOGASTRODUODENOSCOPY (EGD);  Surgeon: Rosalie Kitchens, MD;  Location: [facility];  Service: Endoscopy;  Laterality: N/A;   ESOPHAGOGASTRODUODENOSCOPY (EGD) WITH PROPOFOL  N/A 02/15/2015   Procedure: ESOPHAGOGASTRODUODENOSCOPY (EGD) WITH PROPOFOL ;  Surgeon: Kitchens Rosalie, MD;  Location: [facility] ENDOSCOPY;  Service: Endoscopy;  Laterality: N/A;   ESOPHAGOGASTRODUODENOSCOPY (EGD) WITH PROPOFOL  N/A 11/12/2015   Procedure: ESOPHAGOGASTRODUODENOSCOPY (EGD) WITH PROPOFOL ;  Surgeon: Kitchens Rosalie, MD;  Location: [facility] ENDOSCOPY;  Service: Endoscopy;  Laterality: N/A;    ESOPHAGOGASTRODUODENOSCOPY (EGD) WITH PROPOFOL  N/A 08/07/2016   Procedure: ESOPHAGOGASTRODUODENOSCOPY (EGD) WITH PROPOFOL ;  Surgeon: Kitchens Rosalie, MD;  Location: [facility] ENDOSCOPY;  Service: Endoscopy;  Laterality: N/A;   ESOPHAGOGASTRODUODENOSCOPY (EGD) WITH PROPOFOL  N/A 05/20/2017   Procedure: ESOPHAGOGASTRODUODENOSCOPY (EGD) WITH PROPOFOL ;  Surgeon: Rosalie Kitchens, MD;  Location: [facility] ENDOSCOPY;  Service: Endoscopy;  Laterality: N/A;   ESOPHAGOGASTRODUODENOSCOPY (EGD) WITH PROPOFOL  N/A 12/02/2017   Procedure: ESOPHAGOGASTRODUODENOSCOPY (EGD) WITH PROPOFOL ;  Surgeon: Rosalie Kitchens, MD;  Location: [facility] ENDOSCOPY;  Service: Endoscopy;  Laterality: N/A;  have c arm available   ESOPHAGOGASTRODUODENOSCOPY (EGD) WITH PROPOFOL  N/A 01/23/2019   Procedure: ESOPHAGOGASTRODUODENOSCOPY (EGD) WITH PROPOFOL ;  Surgeon: Rosalie Kitchens, MD;  Location: [facility] ENDOSCOPY;  Service: Endoscopy;  Laterality: N/A;   ESOPHAGOGASTRODUODENOSCOPY (EGD) WITH PROPOFOL  N/A 07/13/2019   Procedure: ESOPHAGOGASTRODUODENOSCOPY (EGD) WITH PROPOFOL ;  Surgeon: Rosalie Kitchens, MD;  Location: [facility] ENDOSCOPY;  Service: Endoscopy;  Laterality: N/A;   ESOPHAGOGASTRODUODENOSCOPY (EGD) WITH PROPOFOL  N/A 02/02/2020   Procedure: ESOPHAGOGASTRODUODENOSCOPY (EGD) WITH PROPOFOL ;  Surgeon: Rosalie Kitchens, MD;  Location: [facility] ENDOS[facility];  Service: Endoscopy;  Laterality: N/A;   ESOPHAGOGASTRODUODENOSCOPY (EGD) WITH PROPOFOL  N/A 07/16/2020   Procedure: ESOPHAGOGASTRODUODENOSCOPY (EGD) WITH PROPOFOL ;  Surgeon: Rosalie Kitchens, MD;  Location: [facility]  ENDOSCOPY;  Service: Endoscopy;  Laterality: N/A;   ESOPHAGOGASTRODUODENOSCOPY (EGD) WITH PROPOFOL  N/A 02/19/2021   Procedure: ESOPHAGOGASTRODUODENOSCOPY (EGD) WITH PROPOFOL ;  Surgeon: Rosalie Kitchens, MD;  Location: [facility] ENDOSCOPY;  Service: Endoscopy;  Laterality: N/A;  with possible botox    ESOPHAGOGASTRODUODENOSCOPY (EGD) WITH PROPOFOL  N/A 09/30/2021   Procedure: ESOPHAGOGASTRODUODENOSCOPY (EGD) WITH PROPOFOL ;  Surgeon: Rosalie Kitchens, MD;  Location: [facility]  ENDOSCOPY;  Service: Endoscopy;  Laterality: N/A;   ESOPHAGOGASTRODUODENOSCOPY (EGD) WITH PROPOFOL  N/A 03/19/2022   Procedure: ESOPHAGOGASTRODUODENOSCOPY (EGD) WITH PROPOFOL ;  Surgeon: Rosalie Kitchens, MD;  Location: [facility] ENDOSCOPY;  Service: Gastroenterology;  Laterality: N/A;   ESOPHAGOGASTRODUODENOSCOPY (EGD) WITH PROPOFOL  N/A 05/05/2023   Procedure: ESOPHAGOGASTRODUODENOSCOPY (EGD) WITH PROPOFOL ;  Surgeon: Rosalie Kitchens, MD;  Location: [facility] ENDOSCOPY;  Service: Gastroenterology;  Laterality: N/A;  with balloon dilation   FOREIGN BODY REMOVAL  07/19/2018   Procedure: FOREIGN BODY REMOVAL;  Surgeon: Rosalie Kitchens, MD;  Location: [facility] ENDOSCOPY;  Service: Endoscopy;;   FRACTURE SURGERY     Hx: of left wrist 01/2021   GASTRECTOMY  [phone]   GASTRECTOMY N/A    X3   LITHOTRIPSY     Hx; of for kidney stones [id number]   SAVORY DILATION N/A 08/10/2014   Procedure: SAVORY DILATION;  Surgeon: Kitchens FORBES Rosalie, MD;  Location: [facility] ENDOSCOPY;  Service: Endoscopy;  Laterality: N/A;   TOTAL HIP ARTHROPLASTY Right 12/30/2017   Procedure: RIGHT TOTAL HIP ARTHROPLASTY;  Surgeon: Margrette Taft FORBES, MD;  Location: [facility];  Service: Orthopedics;  Laterality: Right;   TOTAL HIP ARTHROPLASTY Left 02/17/2018   Procedure: TOTAL HIP ARTHROPLASTY;  Surgeon: Margrette Taft FORBES, MD;  Location: [facility];  Service: Orthopedics;  Laterality: Left;   TUBAL LIGATION     1975   Patient Active Problem List   Diagnosis Date Noted   Stricture of esophagus 08/21/2022   Closed nondisplaced oblique fracture of shaft of ulna with routine healing 03/20/2021   Closed fracture of lower end of right ulna 01/11/2021   Oral phase dysphagia 12/19/2020   Slow transit constipation 12/19/2020   Weight decreased 12/19/2020   Acquired hypertrophic pyloric stenosis 12/19/2020   Osteoporosis without pathological fracture 06/18/2020   Anemia, unspecified 06/27/2018   Age-related osteoporosis with current pathological fracture of right femur (HCC)  03/01/2018   Status post total hip replacement, left 02/17/18 02/17/2018   Closed displaced fracture of left femoral neck (HCC) 02/14/2018   S/P hip replacement, right 12/30/17 01/13/2018   Closed displaced fracture of right femoral neck (HCC) 12/30/2017   BMI less than 19,adult 01/10/2016   GAD (generalized anxiety disorder) 01/10/2016   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   Hypothyroidism 04/24/2013   GERD (gastroesophageal reflux disease) 04/24/2013   Rosacea 04/24/2013   Iron  deficiency anemia due to chronic blood loss 06/09/2012   Gastric outlet obstruction 06/16/2011   Atrioventricular nodal re-entry tachycardia (HCC) 03/11/2010   MURMUR 03/11/2010   PERSONAL HISTORY OF URINARY CALCULI 02/14/2008   REFERRING PROVIDER: Georgina Ozell LABOR, MD   REFERRING DIAG: Cervicalgia   THERAPY DIAG:  Cervicalgia  Other muscle spasm  Pain in right arm  Rationale for Evaluation and Treatment: Rehabilitation  ONSET DATE: 6-7 months  SUBJECTIVE:  SUBJECTIVE STATEMENT: That last treatment helped a lot.  Pain down to a 2 today.  PERTINENT HISTORY:  Osteoporosis, anxiety, arthritis,history of COVID-19, depression, and allergies  PAIN:  Are you having pain? Yes: NPRS scale: no number given/10 Pain location: Bil neck   PRECAUTIONS: None  RED FLAGS: None    WEIGHT BEARING RESTRICTIONS: No  FALLS:  Has patient fallen in last 6 months? No  LIVING ENVIRONMENT: Lives with: lives alone Lives in: House/apartment  OCCUPATION: retired   PLOF: Independent  PATIENT GOALS: reduced pain and improved mobility  NEXT MD VISIT: only as needed  OBJECTIVE:  Note: Objective measures were completed at Evaluation unless otherwise noted.  DIAGNOSTIC FINDINGS: 03/22/24 cervical x-ray grade 1  spondylolisthesis at C5/6.  No evidence of  instability on flexion/extension views.  Disc height loss with anterior osteophyte formation at C5/6 and C6/7.  No fracture or dislocation seen.   PATIENT SURVEYS:  NDI:  NECK DISABILITY INDEX  Date: 03/29/24 Score  Pain intensity 1 = The pain is very mild at the moment  2. Personal care (washing, dressing, etc.) 0 = I can look after myself normally without causing extra pain  3. Lifting 1 =  I can lift heavy weights but it gives extra pain  4. Reading 1 = I can read as much as I want to with slight pain in my neck  5. Headaches 0 = I have no headaches at all  6. Concentration 0 =  I can concentrate fully when I want to with no difficulty  7. Work 1 =  I can only do my usual work, but no more  8. Driving 0 = I can drive my car without any neck pain  9. Sleeping 0 = I have no trouble sleeping  10. Recreation 0 = I am able to engage in all my recreation activities with no neck pain at all  Total 4/50   Minimum Detectable Change (90% confidence): 5 points or 10% points  COGNITION: Overall cognitive status: Within functional limits for tasks assessed  SENSATION: Patient reports no numbness or tingling  POSTURE: rounded shoulders and forward head  PALPATION: TTP: bilateral scapular stabilizers, right upper trapezius, levator scapulae, and cervical paraspinals   CERVICAL ROM: significant difficulty isolating cervical rotation both directions  Active ROM A/PROM (deg) eval  Flexion 48  Extension 42; sore  Right lateral flexion 32; tension   Left lateral flexion 36  Right rotation 40  Left rotation 45   (Blank rows = not tested)  UPPER EXTREMITY ROM: WFL for activities assessed  UPPER EXTREMITY MMT:  MMT Right eval Left eval  Shoulder flexion 4/5 4/5  Shoulder extension    Shoulder abduction 4/5 4/5  Shoulder adduction    Shoulder extension    Shoulder internal rotation    Shoulder external rotation    Middle trapezius     Lower trapezius    Elbow flexion 4/5 4+/5  Elbow extension 3+/5 4/5  Wrist flexion    Wrist extension    Wrist ulnar deviation    Wrist radial deviation    Wrist pronation    Wrist supination    Grip strength 15 18   (Blank rows = not tested)  CERVICAL SPECIAL TESTS:  Spurling's test: Negative and Sharp pursor's test: Negative  TREATMENT DATE:    04/21/24:   Low-level combo e'stim/US  at 1.50 W/CM2 x 12 minutes (6 minutes to each UT) f/b STW/M x 11 minutes f/b  HMP and IFC at 80-150 Hz  on 40% scan x 20 minutes. Normal modality response following removal of modality.                                                                                                                       04/17/24:  Low-level combo e'stim/US  at 1.50 W/CM2 x 12 minutes (6 minutes to each UT) f/b STW/M x 12 minutes f/b  HMP and IFC at 80-150 Hz on 40% scan x 20 minutes. Normal modality response following removal of modality.                                                                                                                          04/14/24:  UBE x 8 minutes f/b STW/M x 15 minutes to patient's bilateral UT's f/b HMP and IFC at 80-150 Hz on 40% scan x 20 minutes. Normal modality response following removal of modality.                                                                                                                       04/11/24    EXERCISE LOG  Exercise Repetitions and Resistance Comments  UBE  10 minutes @ 120 RPM   Pulley 5 mins   Resisted row  Green t-band x 20 reps   Resisted pull down   Green t-band x 20 reps    Therabar bending   Up and down   Bicep curl     Bilateral shoulder ER   With tactile cueing to prevent shoulder ABD  Isometric ball squeeze   For shoulder IR   Cervical SNAG's  For rotation   Blank cell = exercise not performed today   Manual Therapy Soft Tissue Mobilization: Cervical, STW/M to bil cervical paraspinals and levator scap to decrease pain and tone    Modalities  Date:  Unattended Estim: Cervical, Pre Mod 80-150 Hz, 15 mins, Pain and Tone Hot Pack: Cervical, 15 mins, Pain and Tone   PATIENT  EDUCATION:  Education details: plan for physical therapy, taking medications as prescribed Person educated: Patient Education method: Explanation Education comprehension: verbalized understanding  HOME EXERCISE PROGRAM:   ASSESSMENT:  CLINICAL IMPRESSION: Excellent response to last treatment and today's with no pain reported after treatment.  OBJECTIVE IMPAIRMENTS: decreased mobility, decreased ROM, decreased strength, hypomobility, impaired tone, impaired UE functional use, postural dysfunction, and pain.   ACTIVITY LIMITATIONS: lifting and sleeping  PARTICIPATION LIMITATIONS: meal prep, cleaning, and laundry  PERSONAL FACTORS: Past/current experiences, Time since onset of injury/illness/exacerbation, and 3+ comorbidities: Osteoporosis, anxiety, arthritis,history of COVID-19, depression, and allergies are also affecting patient's functional outcome.   REHAB POTENTIAL: Good  CLINICAL DECISION MAKING: Evolving/moderate complexity  EVALUATION COMPLEXITY: Moderate   GOALS: Goals reviewed with patient? Yes  LONG TERM GOALS: Target date: 04/26/24  Patient will be independent with her HEP. Baseline:  Goal status: INITIAL  2.  Patient will be able to demonstrate at least 50 degrees of cervical rotation bilaterally for improved awareness of her surroundings. Baseline:  Goal status: INITIAL  3.  Patient will improve her right triceps strength to at least 4/5 for improved function reaching and picking up items at home. Baseline:  Goal status: INITIAL  PLAN:  PT FREQUENCY: 1-2x/week  PT DURATION: 4 weeks  PLANNED INTERVENTIONS: 97164- PT Re-evaluation, 97750- Physical Performance Testing, 97110-Therapeutic exercises, 97530- Therapeutic activity, [id number]- Neuromuscular re-education, 97535- Self Care, [id number]- Manual therapy, [id number]-  Electrical stimulation (unattended), 02987- Traction (mechanical), 20560 (1-2 muscles), 20561 (3+ muscles)- Dry Needling, Patient/Family education, Joint mobilization, Spinal mobilization, Cryotherapy, and Moist heat  PLAN FOR NEXT SESSION: UBE, manual therapy, scapular retraction, upper extremity strengthening, and modalities as needed   Olander Friedl, [place], PT 04/21/2024, 12:25 PM

## 2024-04-24 ENCOUNTER — Ambulatory Visit: Admitting: Physical Therapy

## 2024-04-24 DIAGNOSIS — M542 Cervicalgia: Secondary | ICD-10-CM | POA: Diagnosis not present

## 2024-04-24 DIAGNOSIS — M79601 Pain in right arm: Secondary | ICD-10-CM

## 2024-04-24 DIAGNOSIS — M62838 Other muscle spasm: Secondary | ICD-10-CM

## 2024-04-24 NOTE — Therapy (Signed)
 OUTPATIENT PHYSICAL THERAPY CERVICAL TREATMENT   Patient Name: Brittany Hensley MRN: [id number] DOB:10-09-1950, 73 y.o., female Today's Date: 04/24/2024  END OF SESSION:  PT End of Session - 04/24/24 1115     Visit Number 7    Number of Visits 14    Date for Recertification  06/02/24    PT Start Time 1015    PT Stop Time 1110    PT Time Calculation (min) 55 min    Activity Tolerance Patient tolerated treatment well    Behavior During Therapy [facility] for tasks assessed/performed            Past Medical History:  Diagnosis Date   Anemia    iron     Anxiety    Arthritis    COVID    2020 bad cold s/s lasted 1 week. 04/28/2021   Depression    Diverticulitis    Hx: of   GERD (gastroesophageal reflux disease)    at times for 15 yrs 04/28/2021   History of kidney stones    10 years ago   Hypothyroidism    Low iron     Pneumonia    2020   Rosacea    Hx: of   SCC (squamous cell carcinoma) 08/13/2021   in situ-left temple (CX35FU)   SCC (squamous cell carcinoma) 08/13/2021   well diff-right forehead (CX35FU)   Squamous cell carcinoma of skin 08/03/2021   in situ- left malar cheek (CX35FU)   Past Surgical History:  Procedure Laterality Date   APPENDECTOMY     BALLOON DILATION  06/16/2011   Procedure: BALLOON DILATION;  Surgeon: Oliva FORBES Boots, MD;  Location: [facility] ENDOSCOPY;  Service: Endoscopy;  Laterality: N/A;   BALLOON DILATION N/A 10/03/2013   Procedure: BALLOON DILATION;  Surgeon: Oliva FORBES Boots, MD;  Location: [facility] ENDOSCOPY;  Service: Endoscopy;  Laterality: N/A;   BALLOON DILATION N/A 08/10/2014   Procedure: BALLOON DILATION;  Surgeon: Oliva FORBES Boots, MD;  Location: [facility] ENDOSCOPY;  Service: Endoscopy;  Laterality: N/A;  from 12 - 15 cm dilation completed   BALLOON DILATION N/A 02/15/2015   Procedure: BALLOON DILATION;  Surgeon: Oliva Boots, MD;  Location: [facility] ENDOSCOPY;  Service: Endoscopy;  Laterality: N/A;   BALLOON DILATION N/A 11/12/2015   Procedure: BALLOON DILATION;   Surgeon: Oliva Boots, MD;  Location: [facility] ENDOSCOPY;  Service: Endoscopy;  Laterality: N/A;   BALLOON DILATION N/A 08/10/2016   Procedure: BALLOON DILATION;  Surgeon: Oliva Boots, MD;  Location: [facility] ENDOSCOPY;  Service: Endoscopy;  Laterality: N/A;   BALLOON DILATION N/A 08/07/2016   Procedure: BALLOON DILATION;  Surgeon: Oliva Boots, MD;  Location: [facility] ENDOSCOPY;  Service: Endoscopy;  Laterality: N/A;   BALLOON DILATION N/A 05/20/2017   Procedure: BALLOON DILATION;  Surgeon: Boots Oliva, MD;  Location: [facility] ENDOSCOPY;  Service: Endoscopy;  Laterality: N/A;   BALLOON DILATION N/A 12/02/2017   Procedure: BALLOON DILATION;  Surgeon: Boots Oliva, MD;  Location: [facility] ENDOSCOPY;  Service: Endoscopy;  Laterality: N/A;   BALLOON DILATION N/A 07/19/2018   Procedure: BALLOON DILATION;  Surgeon: Boots Oliva, MD;  Location: [facility] ENDOSCOPY;  Service: Endoscopy;  Laterality: N/A;   BALLOON DILATION N/A 01/23/2019   Procedure: BALLOON DILATION;  Surgeon: Boots Oliva, MD;  Location: [facility] ENDOSCOPY;  Service: Endoscopy;  Laterality: N/A;   BALLOON DILATION N/A 07/13/2019   Procedure: BALLOON DILATION;  Surgeon: Boots Oliva, MD;  Location: [facility]OSCOPY;  Service: Endoscopy;  Laterality: N/A;   BALLOON DILATION N/A 02/02/2020   Procedure: BALLOON DILATION;  Surgeon:  Rosalie Kitchens, MD;  Location: [facility];  Service: Endoscopy;  Laterality: N/A;   BALLOON DILATION N/A 07/16/2020   Procedure: BALLOON DILATION;  Surgeon: Rosalie Kitchens, MD;  Location: [facility] ENDOSCOPY;  Service: Endoscopy;  Laterality: N/A;   BALLOON DILATION N/A 02/19/2021   Procedure: BALLOON DILATION;  Surgeon: Rosalie Kitchens, MD;  Location: [facility]OSCOPY;  Service: Endoscopy;  Laterality: N/A;   BALLOON DILATION N/A 09/30/2021   Procedure: BALLOON DILATION;  Surgeon: Rosalie Kitchens, MD;  Location: [facility]OSCOPY;  Service: Endoscopy;  Laterality: N/A;   BALLOON DILATION N/A 03/19/2022   Procedure: BALLOON DILATION;  Surgeon: Rosalie Kitchens, MD;  Location: [facility]OSCOPY;   Service: Gastroenterology;  Laterality: N/A;   BALLOON DILATION N/A 05/05/2023   Procedure: BALLOON DILATION;  Surgeon: Rosalie Kitchens, MD;  Location: [facility]OSCOPY;  Service: Gastroenterology;  Laterality: N/A;   CATARACT EXTRACTION, BILATERAL     2019   CHOLECYSTECTOMY OPEN  1979   COLONOSCOPY     Hx: of   CYSTOSCOPY/URETEROSCOPY/HOLMIUM LASER/STENT PLACEMENT Bilateral 05/06/2021   Procedure: CYSTOSCOPY BILATERAL RETROGRADE LEFT URETEROSCOPY/HOLMIUM LASER/STENT PLACEMENT;  Surgeon: Watt Rush, MD;  Location[facility];  Service: Urology;  Laterality: Bilateral;   ESOPHAGOGASTRODUODENOSCOPY  06/16/2011   Procedure: ESOPHAGOGASTRODUODENOSCOPY (EGD);  Surgeon: Kitchens FORBES Rosalie, MD;  Location: [facility] ENDOSCOPY;  Service: Endoscopy;  Laterality: N/A;   ESOPHAGOGASTRODUODENOSCOPY N/A 10/03/2013   Procedure: ESOPHAGOGASTRODUODENOSCOPY (EGD);  Surgeon: Kitchens FORBES Rosalie, MD;  Location: [facility];  Service: Endoscopy;  Laterality: N/A;   ESOPHAGOGASTRODUODENOSCOPY N/A 08/10/2014   Procedure: ESOPHAGOGASTRODUODENOSCOPY (EGD);  Surgeon: Kitchens FORBES Rosalie, MD;  Location: [facility];  Service: Endoscopy;  Laterality: N/A;   ESOPHAGOGASTRODUODENOSCOPY N/A 08/10/2016   Procedure: ESOPHAGOGASTRODUODENOSCOPY (EGD);  Surgeon: Kitchens Rosalie, MD;  Location: [facility] ENDOSCOPY;  Service: Endoscopy;  Laterality: N/A;   ESOPHAGOGASTRODUODENOSCOPY N/A 07/19/2018   Procedure: ESOPHAGOGASTRODUODENOSCOPY (EGD);  Surgeon: Rosalie Kitchens, MD;  Location: [facility];  Service: Endoscopy;  Laterality: N/A;   ESOPHAGOGASTRODUODENOSCOPY (EGD) WITH PROPOFOL  N/A 02/15/2015   Procedure: ESOPHAGOGASTRODUODENOSCOPY (EGD) WITH PROPOFOL ;  Surgeon: Kitchens Rosalie, MD;  Location: [facility] ENDOSCOPY;  Service: Endoscopy;  Laterality: N/A;   ESOPHAGOGASTRODUODENOSCOPY (EGD) WITH PROPOFOL  N/A 11/12/2015   Procedure: ESOPHAGOGASTRODUODENOSCOPY (EGD) WITH PROPOFOL ;  Surgeon: Kitchens Rosalie, MD;  Location: [facility] ENDOSCOPY;  Service: Endoscopy;  Laterality: N/A;    ESOPHAGOGASTRODUODENOSCOPY (EGD) WITH PROPOFOL  N/A 08/07/2016   Procedure: ESOPHAGOGASTRODUODENOSCOPY (EGD) WITH PROPOFOL ;  Surgeon: Kitchens Rosalie, MD;  Location: [facility] ENDOSCOPY;  Service: Endoscopy;  Laterality: N/A;   ESOPHAGOGASTRODUODENOSCOPY (EGD) WITH PROPOFOL  N/A 05/20/2017   Procedure: ESOPHAGOGASTRODUODENOSCOPY (EGD) WITH PROPOFOL ;  Surgeon: Rosalie Kitchens, MD;  Location: [facility] ENDOSCOPY;  Service: Endoscopy;  Laterality: N/A;   ESOPHAGOGASTRODUODENOSCOPY (EGD) WITH PROPOFOL  N/A 12/02/2017   Procedure: ESOPHAGOGASTRODUODENOSCOPY (EGD) WITH PROPOFOL ;  Surgeon: Rosalie Kitchens, MD;  Location: [facility] ENDOSCOPY;  Service: Endoscopy;  Laterality: N/A;  have c arm available   ESOPHAGOGASTRODUODENOSCOPY (EGD) WITH PROPOFOL  N/A 01/23/2019   Procedure: ESOPHAGOGASTRODUODENOSCOPY (EGD) WITH PROPOFOL ;  Surgeon: Rosalie Kitchens, MD;  Location: [facility] ENDOSCOPY;  Service: Endoscopy;  Laterality: N/A;   ESOPHAGOGASTRODUODENOSCOPY (EGD) WITH PROPOFOL  N/A 07/13/2019   Procedure: ESOPHAGOGASTRODUODENOSCOPY (EGD) WITH PROPOFOL ;  Surgeon: Rosalie Kitchens, MD;  Location: [facility] ENDOSCOPY;  Service: Endoscopy;  Laterality: N/A;   ESOPHAGOGASTRODUODENOSCOPY (EGD) WITH PROPOFOL  N/A 02/02/2020   Procedure: ESOPHAGOGASTRODUODENOSCOPY (EGD) WITH PROPOFOL ;  Surgeon: Rosalie Kitchens, MD;  Location: [facility] END[facility];  Service: Endoscopy;  Laterality: N/A;   ESOPHAGOGASTRODUODENOSCOPY (EGD) WITH PROPOFOL  N/A 07/16/2020   Procedure: ESOPHAGOGASTRODUODENOSCOPY (EGD) WITH PROPOFOL ;  Surgeon: Rosalie Kitchens, MD;  Location: [facility]  ENDOSCOPY;  Service: Endoscopy;  Laterality: N/A;   ESOPHAGOGASTRODUODENOSCOPY (EGD) WITH PROPOFOL  N/A 02/19/2021   Procedure: ESOPHAGOGASTRODUODENOSCOPY (EGD) WITH PROPOFOL ;  Surgeon: Rosalie Kitchens, MD;  Location: [facility] ENDOSCOPY;  Service: Endoscopy;  Laterality: N/A;  with possible botox    ESOPHAGOGASTRODUODENOSCOPY (EGD) WITH PROPOFOL  N/A 09/30/2021   Procedure: ESOPHAGOGASTRODUODENOSCOPY (EGD) WITH PROPOFOL ;  Surgeon: Rosalie Kitchens, MD;  Location: [facility]  ENDOSCOPY;  Service: Endoscopy;  Laterality: N/A;   ESOPHAGOGASTRODUODENOSCOPY (EGD) WITH PROPOFOL  N/A 03/19/2022   Procedure: ESOPHAGOGASTRODUODENOSCOPY (EGD) WITH PROPOFOL ;  Surgeon: Rosalie Kitchens, MD;  Location: [facility] ENDOSCOPY;  Service: Gastroenterology;  Laterality: N/A;   ESOPHAGOGASTRODUODENOSCOPY (EGD) WITH PROPOFOL  N/A 05/05/2023   Procedure: ESOPHAGOGASTRODUODENOSCOPY (EGD) WITH PROPOFOL ;  Surgeon: Rosalie Kitchens, MD;  Location: [facility] ENDOSCOPY;  Service: Gastroenterology;  Laterality: N/A;  with balloon dilation   FOREIGN BODY REMOVAL  07/19/2018   Procedure: FOREIGN BODY REMOVAL;  Surgeon: Rosalie Kitchens, MD;  Location: [facility] ENDOSCOPY;  Service: Endoscopy;;   FRACTURE SURGERY     Hx: of left wrist 01/2021   GASTRECTOMY  [phone]   GASTRECTOMY N/A    X3   LITHOTRIPSY     Hx; of for kidney stones [id number]   SAVORY DILATION N/A 08/10/2014   Procedure: SAVORY DILATION;  Surgeon: Kitchens FORBES Rosalie, MD;  Location: [facility] ENDOSCOPY;  Service: Endoscopy;  Laterality: N/A;   TOTAL HIP ARTHROPLASTY Right 12/30/2017   Procedure: RIGHT TOTAL HIP ARTHROPLASTY;  Surgeon: Margrette Taft FORBES, MD;  Location: [facility];  Service: Orthopedics;  Laterality: Right;   TOTAL HIP ARTHROPLASTY Left 02/17/2018   Procedure: TOTAL HIP ARTHROPLASTY;  Surgeon: Margrette Taft FORBES, MD;  Location: [facility];  Service: Orthopedics;  Laterality: Left;   TUBAL LIGATION     1975   Patient Active Problem List   Diagnosis Date Noted   Stricture of esophagus 08/21/2022   Closed nondisplaced oblique fracture of shaft of ulna with routine healing 03/20/2021   Closed fracture of lower end of right ulna 01/11/2021   Oral phase dysphagia 12/19/2020   Slow transit constipation 12/19/2020   Weight decreased 12/19/2020   Acquired hypertrophic pyloric stenosis 12/19/2020   Osteoporosis without pathological fracture 06/18/2020   Anemia, unspecified 06/27/2018   Age-related osteoporosis with current pathological fracture of right femur (HCC)  03/01/2018   Status post total hip replacement, left 02/17/18 02/17/2018   Closed displaced fracture of left femoral neck (HCC) 02/14/2018   S/P hip replacement, right 12/30/17 01/13/2018   Closed displaced fracture of right femoral neck (HCC) 12/30/2017   BMI less than 19,adult 01/10/2016   GAD (generalized anxiety disorder) 01/10/2016   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   Hypothyroidism 04/24/2013   GERD (gastroesophageal reflux disease) 04/24/2013   Rosacea 04/24/2013   Iron  deficiency anemia due to chronic blood loss 06/09/2012   Gastric outlet obstruction 06/16/2011   Atrioventricular nodal re-entry tachycardia (HCC) 03/11/2010   MURMUR 03/11/2010   PERSONAL HISTORY OF URINARY CALCULI 02/14/2008   REFERRING PROVIDER: Georgina Ozell LABOR, MD   REFERRING DIAG: Cervicalgia   THERAPY DIAG:  Cervicalgia  Other muscle spasm  Pain in right arm  Rationale for Evaluation and Treatment: Rehabilitation  ONSET DATE: 6-7 months  SUBJECTIVE:  SUBJECTIVE STATEMENT: More pain on left than right today.  Patient feeling about 30% better overall.   PERTINENT HISTORY:  Osteoporosis, anxiety, arthritis,history of COVID-19, depression, and allergies  PAIN:  Are you having pain? Yes: NPRS scale: no number given/10 Pain location: Bil neck   PRECAUTIONS: None  RED FLAGS: None    WEIGHT BEARING RESTRICTIONS: No  FALLS:  Has patient fallen in last 6 months? No  LIVING ENVIRONMENT: Lives with: lives alone Lives in: House/apartment  OCCUPATION: retired   PLOF: Independent  PATIENT GOALS: reduced pain and improved mobility  NEXT MD VISIT: only as needed  OBJECTIVE:  Note: Objective measures were completed at Evaluation unless otherwise noted.  DIAGNOSTIC FINDINGS: 03/22/24  cervical x-ray grade 1 spondylolisthesis at C5/6.  No evidence of  instability on flexion/extension views.  Disc height loss with anterior osteophyte formation at C5/6 and C6/7.  No fracture or dislocation seen.   PATIENT SURVEYS:  NDI:  NECK DISABILITY INDEX  Date: 03/29/24 Score  Pain intensity 1 = The pain is very mild at the moment  2. Personal care (washing, dressing, etc.) 0 = I can look after myself normally without causing extra pain  3. Lifting 1 =  I can lift heavy weights but it gives extra pain  4. Reading 1 = I can read as much as I want to with slight pain in my neck  5. Headaches 0 = I have no headaches at all  6. Concentration 0 =  I can concentrate fully when I want to with no difficulty  7. Work 1 =  I can only do my usual work, but no more  8. Driving 0 = I can drive my car without any neck pain  9. Sleeping 0 = I have no trouble sleeping  10. Recreation 0 = I am able to engage in all my recreation activities with no neck pain at all  Total 4/50   Minimum Detectable Change (90% confidence): 5 points or 10% points  COGNITION: Overall cognitive status: Within functional limits for tasks assessed  SENSATION: Patient reports no numbness or tingling  POSTURE: rounded shoulders and forward head  PALPATION: TTP: bilateral scapular stabilizers, right upper trapezius, levator scapulae, and cervical paraspinals   CERVICAL ROM: significant difficulty isolating cervical rotation both directions  Active ROM A/PROM (deg) eval  Flexion 48  Extension 42; sore  Right lateral flexion 32; tension   Left lateral flexion 36  Right rotation 40  Left rotation 45   (Blank rows = not tested)  UPPER EXTREMITY ROM: WFL for activities assessed  UPPER EXTREMITY MMT:  MMT Right eval Left eval  Shoulder flexion 4/5 4/5  Shoulder extension    Shoulder abduction 4/5 4/5  Shoulder adduction    Shoulder extension    Shoulder internal rotation    Shoulder external rotation     Middle trapezius    Lower trapezius    Elbow flexion 4/5 4+/5  Elbow extension 3+/5 4/5  Wrist flexion    Wrist extension    Wrist ulnar deviation    Wrist radial deviation    Wrist pronation    Wrist supination    Grip strength 15 18   (Blank rows = not tested)  CERVICAL SPECIAL TESTS:  Spurling's test: Negative and Sharp pursor's test: Negative  TREATMENT DATE:    04/24/24:  Combo e'stim/US  at 1.50 W/CM2 x 10 minutes to left UT f/b STW/M x 13 minutes including ischemic release technique f/b HMP and IFC at 80-150  Hz on 40% scan x 20 minutes.  Normal modality response following removal of modality.                                                                                           04/21/24:   Low-level combo e'stim/US  at 1.50 W/CM2 x 12 minutes (6 minutes to each UT) f/b STW/M x 11 minutes f/b  HMP and IFC at 80-150 Hz on 40% scan x 20 minutes. Normal modality response following removal of modality.                                                                                                                       04/17/24:  Low-level combo e'stim/US  at 1.50 W/CM2 x 12 minutes (6 minutes to each UT) f/b STW/M x 12 minutes f/b  HMP and IFC at 80-150 Hz on 40% scan x 20 minutes. Normal modality response following removal of modality.                                                                                                                        PATIENT EDUCATION:  Education details: plan for physical therapy, taking medications as prescribed Person educated: Patient Education method: Explanation Education comprehension: verbalized understanding  HOME EXERCISE PROGRAM:   Clinical impression statement:  Patient is progressing well and reports a subjective improvement rating of 30%.    OBJECTIVE IMPAIRMENTS: decreased mobility, decreased ROM, decreased strength, hypomobility, impaired tone, impaired UE functional use, postural dysfunction, and pain.   ACTIVITY  LIMITATIONS: lifting and sleeping  PARTICIPATION LIMITATIONS: meal prep, cleaning, and laundry  PERSONAL FACTORS: Past/current experiences, Time since onset of injury/illness/exacerbation, and 3+ comorbidities: Osteoporosis, anxiety, arthritis,history of COVID-19, depression, and allergies are also affecting patient's functional outcome.   REHAB POTENTIAL: Good  CLINICAL DECISION MAKING: Evolving/moderate complexity  EVALUATION COMPLEXITY: Moderate   GOALS: Goals reviewed with patient? Yes  LONG TERM GOALS: Target date: 04/26/24  Patient will be independent with her HEP. Baseline:  Goal status: INITIAL  2.  Patient will be able to demonstrate at least 50 degrees of cervical rotation bilaterally for improved awareness  of her surroundings. Baseline:  Goal status: INITIAL  3.  Patient will improve her right triceps strength to at least 4/5 for improved function reaching and picking up items at home. Baseline:  Goal status: INITIAL  PLAN:  PT FREQUENCY: 1-2x/week  PT DURATION: 4 weeks  PLANNED INTERVENTIONS: 97164- PT Re-evaluation, 97750- Physical Performance Testing, 97110-Therapeutic exercises, 97530- Therapeutic activity, [id number]- Neuromuscular re-education, 97535- Self Care, 02859- Manual therapy, [id number]- Electrical stimulation (unattended), 02987- Traction (mechanical), 20560 (1-2 muscles), 20561 (3+ muscles)- Dry Needling, Patient/Family education, Joint mobilization, Spinal mobilization, Cryotherapy, and Moist heat  PLAN FOR NEXT SESSION: UBE, manual therapy, scapular retraction, upper extremity strengthening, and modalities as needed   Jesus Poplin, [place], PT 04/24/2024, 11:16 AM

## 2024-04-27 DIAGNOSIS — K3189 Other diseases of stomach and duodenum: Secondary | ICD-10-CM | POA: Diagnosis not present

## 2024-04-27 DIAGNOSIS — Z980 Intestinal bypass and anastomosis status: Secondary | ICD-10-CM | POA: Diagnosis not present

## 2024-04-27 DIAGNOSIS — R1013 Epigastric pain: Secondary | ICD-10-CM | POA: Diagnosis not present

## 2024-04-27 DIAGNOSIS — R6881 Early satiety: Secondary | ICD-10-CM | POA: Diagnosis not present

## 2024-04-28 ENCOUNTER — Encounter: Payer: Self-pay | Admitting: *Deleted

## 2024-04-28 ENCOUNTER — Ambulatory Visit: Admitting: *Deleted

## 2024-04-28 DIAGNOSIS — M62838 Other muscle spasm: Secondary | ICD-10-CM

## 2024-04-28 DIAGNOSIS — M542 Cervicalgia: Secondary | ICD-10-CM

## 2024-04-28 DIAGNOSIS — M79601 Pain in right arm: Secondary | ICD-10-CM

## 2024-04-28 NOTE — Therapy (Signed)
 OUTPATIENT PHYSICAL THERAPY CERVICAL TREATMENT   Patient Name: Brittany Hensley MRN: [id number] DOB:1951-03-22, 73 y.o., female Today's Date: 04/28/2024  END OF SESSION:  PT End of Session - 04/28/24 1143     Visit Number 8    Number of Visits 14    Date for Recertification  06/02/24    PT Start Time 1145            Past Medical History:  Diagnosis Date   Anemia    iron     Anxiety    Arthritis    COVID    2020 bad cold s/s lasted 1 week. 04/28/2021   Depression    Diverticulitis    Hx: of   GERD (gastroesophageal reflux disease)    at times for 15 yrs 04/28/2021   History of kidney stones    10 years ago   Hypothyroidism    Low iron     Pneumonia    2020   Rosacea    Hx: of   SCC (squamous cell carcinoma) 08/13/2021   in situ-left temple (CX35FU)   SCC (squamous cell carcinoma) 08/13/2021   well diff-right forehead (CX35FU)   Squamous cell carcinoma of skin 08/03/2021   in situ- left malar cheek (CX35FU)   Past Surgical History:  Procedure Laterality Date   APPENDECTOMY     BALLOON DILATION  06/16/2011   Procedure: BALLOON DILATION;  Surgeon: Oliva FORBES Boots, MD;  Location: [facility] ENDOSCOPY;  Service: Endoscopy;  Laterality: N/A;   BALLOON DILATION N/A 10/03/2013   Procedure: BALLOON DILATION;  Surgeon: Oliva FORBES Boots, MD;  Location: [facility] ENDOSCOPY;  Service: Endoscopy;  Laterality: N/A;   BALLOON DILATION N/A 08/10/2014   Procedure: BALLOON DILATION;  Surgeon: Oliva FORBES Boots, MD;  Location: [facility] ENDOSCOPY;  Service: Endoscopy;  Laterality: N/A;  from 12 - 15 cm dilation completed   BALLOON DILATION N/A 02/15/2015   Procedure: BALLOON DILATION;  Surgeon: Oliva Boots, MD;  Location: [facility] ENDOSCOPY;  Service: Endoscopy;  Laterality: N/A;   BALLOON DILATION N/A 11/12/2015   Procedure: BALLOON DILATION;  Surgeon: Oliva Boots, MD;  Location: [facility] ENDOSCOPY;  Service: Endoscopy;  Laterality: N/A;   BALLOON DILATION N/A 08/10/2016   Procedure: BALLOON DILATION;  Surgeon: Oliva Boots,  MD;  Location: [facility] ENDOSCOPY;  Service: Endoscopy;  Laterality: N/A;   BALLOON DILATION N/A 08/07/2016   Procedure: BALLOON DILATION;  Surgeon: Oliva Boots, MD;  Location: [facility] ENDOSCOPY;  Service: Endoscopy;  Laterality: N/A;   BALLOON DILATION N/A 05/20/2017   Procedure: BALLOON DILATION;  Surgeon: Boots Oliva, MD;  Location: [facility] ENDOSCOPY;  Service: Endoscopy;  Laterality: N/A;   BALLOON DILATION N/A 12/02/2017   Procedure: BALLOON DILATION;  Surgeon: Boots Oliva, MD;  Location: [facility] ENDOSCOPY;  Service: Endoscopy;  Laterality: N/A;   BALLOON DILATION N/A 07/19/2018   Procedure: BALLOON DILATION;  Surgeon: Boots Oliva, MD;  Location: [facility] ENDOSCOPY;  Service: Endoscopy;  Laterality: N/A;   BALLOON DILATION N/A 01/23/2019   Procedure: BALLOON DILATION;  Surgeon: Boots Oliva, MD;  Location: [facility] ENDOSCOPY;  Service: Endoscopy;  Laterality: N/A;   BALLOON DILATION N/A 07/13/2019   Procedure: BALLOON DILATION;  Surgeon: Boots Oliva, MD;  Location: [facility]OSCOPY;  Service: Endoscopy;  Laterality: N/A;   BALLOON DILATION N/A 02/02/2020   Procedure: BALLOON DILATION;  Surgeon: Boots Oliva, MD;  Location: [facility] ENDOSCOPY;  Service: Endoscopy;  Laterality: N/A;   BALLOON DILATION N/A 07/16/2020   Procedure: BALLOON DILATION;  Surgeon: Boots Oliva, MD;  Location: [facility] ENDOSCOPY;  Service:  Endoscopy;  Laterality: N/A;   BALLOON DILATION N/A 02/19/2021   Procedure: BALLOON DILATION;  Surgeon: Rosalie Kitchens, MD;  Location: [facility] ENDOSCOPY;  Service: Endoscopy;  Laterality: N/A;   BALLOON DILATION N/A 09/30/2021   Procedure: BALLOON DILATION;  Surgeon: Rosalie Kitchens, MD;  Location: [facility] ENDOSCOPY;  Service: Endoscopy;  Laterality: N/A;   BALLOON DILATION N/A 03/19/2022   Procedure: BALLOON DILATION;  Surgeon: Rosalie Kitchens, MD;  Location: [facility] ENDOSCOPY;  Service: Gastroenterology;  Laterality: N/A;   BALLOON DILATION N/A 05/05/2023   Procedure: BALLOON DILATION;  Surgeon: Rosalie Kitchens, MD;  Location: [facility] ENDOSCOPY;  Service:  Gastroenterology;  Laterality: N/A;   CATARACT EXTRACTION, BILATERAL     2019   CHOLECYSTECTOMY OPEN  1979   COLONOSCOPY     Hx: of   CYSTOSCOPY/URETEROSCOPY/HOLMIUM LASER/STENT PLACEMENT Bilateral 05/06/2021   Procedure: CYSTOSCOPY BILATERAL RETROGRADE LEFT URETEROSCOPY/HOLMIUM LASER/STENT PLACEMENT;  Surgeon: Watt Rush, MD;  Location: [facility];  Service: Urology;  Laterality: Bilateral;   ESOPHAGOGASTRODUODENOSCOPY  06/16/2011   Procedure: ESOPHAGOGASTRODUODENOSCOPY (EGD);  Surgeon: Kitchens FORBES Rosalie, MD;  Location: [facility] ENDOSCOPY;  Service: Endoscopy;  Laterality: N/A;   ESOPHAGOGASTRODUODENOSCOPY N/A 10/03/2013   Procedure: ESOPHAGOGASTRODUODENOSCOPY (EGD);  Surgeon: Kitchens FORBES Rosalie, MD;  Location: [facility]COPY;  Service: Endoscopy;  Laterality: N/A;   ESOPHAGOGASTRODUODENOSCOPY N/A 08/10/2014   Procedure: ESOPHAGOGASTRODUODENOSCOPY (EGD);  Surgeon: Kitchens FORBES Rosalie, MD;  Location: [facility];  Service: Endoscopy;  Laterality: N/A;   ESOPHAGOGASTRODUODENOSCOPY N/A 08/10/2016   Procedure: ESOPHAGOGASTRODUODENOSCOPY (EGD);  Surgeon: Kitchens Rosalie, MD;  Location: [facility] ENDOSCOPY;  Service: Endoscopy;  Laterality: N/A;   ESOPHAGOGASTRODUODENOSCOPY N/A 07/19/2018   Procedure: ESOPHAGOGASTRODUODENOSCOPY (EGD);  Surgeon: Rosalie Kitchens, MD;  Location: [facility];  Service: Endoscopy;  Laterality: N/A;   ESOPHAGOGASTRODUODENOSCOPY (EGD) WITH PROPOFOL  N/A 02/15/2015   Procedure: ESOPHAGOGASTRODUODENOSCOPY (EGD) WITH PROPOFOL ;  Surgeon: Kitchens Rosalie, MD;  Location: [facility] ENDOSCOPY;  Service: Endoscopy;  Laterality: N/A;   ESOPHAGOGASTRODUODENOSCOPY (EGD) WITH PROPOFOL  N/A 11/12/2015   Procedure: ESOPHAGOGASTRODUODENOSCOPY (EGD) WITH PROPOFOL ;  Surgeon: Kitchens Rosalie, MD;  Location: [facility] ENDOSCOPY;  Service: Endoscopy;  Laterality: N/A;   ESOPHAGOGASTRODUODENOSCOPY (EGD) WITH PROPOFOL  N/A 08/07/2016   Procedure: ESOPHAGOGASTRODUODENOSCOPY (EGD) WITH PROPOFOL ;  Surgeon: Kitchens Rosalie, MD;  Location: [facility] ENDOSCOPY;   Service: Endoscopy;  Laterality: N/A;   ESOPHAGOGASTRODUODENOSCOPY (EGD) WITH PROPOFOL  N/A 05/20/2017   Procedure: ESOPHAGOGASTRODUODENOSCOPY (EGD) WITH PROPOFOL ;  Surgeon: Rosalie Kitchens, MD;  Location: [facility] ENDOSCOPY;  Service: Endoscopy;  Laterality: N/A;   ESOPHAGOGASTRODUODENOSCOPY (EGD) WITH PROPOFOL  N/A 12/02/2017   Procedure: ESOPHAGOGASTRODUODENOSCOPY (EGD) WITH PROPOFOL ;  Surgeon: Rosalie Kitchens, MD;  Location: [facility] ENDOSCOPY;  Service: Endoscopy;  Laterality: N/A;  have c arm available   ESOPHAGOGASTRODUODENOSCOPY (EGD) WITH PROPOFOL  N/A 01/23/2019   Procedure: ESOPHAGOGASTRODUODENOSCOPY (EGD) WITH PROPOFOL ;  Surgeon: Rosalie Kitchens, MD;  Location: [facility] ENDOSCOPY;  Service: Endoscopy;  Laterality: N/A;   ESOPHAGOGASTRODUODENOSCOPY (EGD) WITH PROPOFOL  N/A 07/13/2019   Procedure: ESOPHAGOGASTRODUODENOSCOPY (EGD) WITH PROPOFOL ;  Surgeon: Rosalie Kitchens, MD;  Location: [facility] ENDOSCOPY;  Service: Endoscopy;  Laterality: N/A;   ESOPHAGOGASTRODUODENOSCOPY (EGD) WITH PROPOFOL  N/A 02/02/2020   Procedure: ESOPHAGOGASTRODUODENOSCOPY (EGD) WITH PROPOFOL ;  Surgeon: Rosalie Kitchens, MD;  Location: [facility];  Service: Endoscopy;  Laterality: N/A;   ESOPHAGOGASTRODUODENOSCOPY (EGD) WITH PROPOFOL  N/A 07/16/2020   Procedure: ESOPHAGOGASTRODUODENOSCOPY (EGD) WITH PROPOFOL ;  Surgeon: Rosalie Kitchens, MD;  Location: [facility] ENDOSCOPY;  Service: Endoscopy;  Laterality: N/A;   ESOPHAGOGASTRODUODENOSCOPY (EGD) WITH PROPOFOL  N/A 02/19/2021   Procedure: ESOPHAGOGASTRODUODENOSCOPY (EGD) WITH PROPOFOL ;  Surgeon: Rosalie Kitchens, MD;  Location: [facility];  Service: Endoscopy;  Laterality: N/A;  with possible botox    ESOPHAGOGASTRODUODENOSCOPY (EGD) WITH PROPOFOL  N/A 09/30/2021   Procedure: ESOPHAGOGASTRODUODENOSCOPY (EGD) WITH PROPOFOL ;  Surgeon: Rosalie Kitchens, MD;  Location: [facility];  Service: Endoscopy;  Laterality: N/A;   ESOPHAGOGASTRODUODENOSCOPY (EGD) WITH PROPOFOL  N/A 03/19/2022   Procedure: ESOPHAGOGASTRODUODENOSCOPY (EGD) WITH PROPOFOL ;   Surgeon: Rosalie Kitchens, MD;  Location: [facility] END[facility];  Service: Gastroenterology;  Laterality: N/A;   ESOPHAGOGASTRODUODENOSCOPY (EGD) WITH PROPOFOL  N/A 05/05/2023   Procedure: ESOPHAGOGASTRODUODENOSCOPY (EGD) WITH PROPOFOL ;  Surgeon: Rosalie Kitchens, MD;  Location: [facility] ENDOSCOPY;  Service: Gastroenterology;  Laterality: N/A;  with balloon dilation   FOREIGN BODY REMOVAL  07/19/2018   Procedure: FOREIGN BODY REMOVAL;  Surgeon: Rosalie Kitchens, MD;  Location: [facility] ENDOSCOPY;  Service: Endoscopy;;   FRACTURE SURGERY     Hx: of left wrist 01/2021   GASTRECTOMY  [id number]   GASTRECTOMY N/A    X3   LITHOTRIPSY     Hx; of for kidney stones 76992   SAVORY DILATION N/A 08/10/2014   Procedure: SAVORY DILATION;  Surgeon: Kitchens FORBES Rosalie, MD;  Location: [facility] ENDOSCOPY;  Service: Endoscopy;  Laterality: N/A;   TOTAL HIP ARTHROPLASTY Right 12/30/2017   Procedure: RIGHT TOTAL HIP ARTHROPLASTY;  Surgeon: Margrette Taft FORBES, MD;  Location: [facility];  Service: Orthopedics;  Laterality: Right;   TOTAL HIP ARTHROPLASTY Left 02/17/2018   Procedure: TOTAL HIP ARTHROPLASTY;  Surgeon: Margrette Taft FORBES, MD;  Location: [facility];  Service: Orthopedics;  Laterality: Left;   TUBAL LIGATION     1975   Patient Active Problem List   Diagnosis Date Noted   Stricture of esophagus 08/21/2022   Closed nondisplaced oblique fracture of shaft of ulna with routine healing 03/20/2021   Closed fracture of lower end of right ulna 01/11/2021   Oral phase dysphagia 12/19/2020   Slow transit constipation 12/19/2020   Weight decreased 12/19/2020   Acquired hypertrophic pyloric stenosis 12/19/2020   Osteoporosis without pathological fracture 06/18/2020   Anemia, unspecified 06/27/2018   Age-related osteoporosis with current pathological fracture of right femur (HCC) 03/01/2018   Status post total hip replacement, left 02/17/18 02/17/2018   Closed displaced fracture of left femoral neck (HCC) 02/14/2018   S/P hip replacement, right 12/30/17  01/13/2018   Closed displaced fracture of right femoral neck (HCC) 12/30/2017   BMI less than 19,adult 01/10/2016   GAD (generalized anxiety disorder) 01/10/2016   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   Hypothyroidism 04/24/2013   GERD (gastroesophageal reflux disease) 04/24/2013   Rosacea 04/24/2013   Iron  deficiency anemia due to chronic blood loss 06/09/2012   Gastric outlet obstruction 06/16/2011   Atrioventricular nodal re-entry tachycardia (HCC) 03/11/2010   MURMUR 03/11/2010   PERSONAL HISTORY OF URINARY CALCULI 02/14/2008   REFERRING PROVIDER: Georgina Ozell LABOR, MD   REFERRING DIAG: Cervicalgia   THERAPY DIAG:  Cervicalgia  Other muscle spasm  Pain in right arm  Rationale for Evaluation and Treatment: Rehabilitation  ONSET DATE: 6-7 months  SUBJECTIVE:  SUBJECTIVE STATEMENT:       Pt reports doing better with decreased pain 4/10.  Patient feeling about 30% better overall.     PERTINENT HISTORY:  Osteoporosis, anxiety, arthritis,history of COVID-19, depression, and allergies  PAIN:  Are you having pain? Yes: NPRS scale: no number given/10 Pain location: Bil neck   PRECAUTIONS: None  RED FLAGS: None    WEIGHT BEARING RESTRICTIONS: No  FALLS:  Has patient fallen in last 6 months? No  LIVING ENVIRONMENT: Lives with: lives alone Lives in: House/apartment  OCCUPATION: retired   PLOF: Independent  PATIENT GOALS: reduced pain and improved mobility  NEXT MD VISIT: only as needed  OBJECTIVE:  Note: Objective measures were completed at Evaluation unless otherwise noted.  DIAGNOSTIC FINDINGS: 03/22/24 cervical x-ray grade 1 spondylolisthesis at C5/6.  No evidence of  instability on flexion/extension views.  Disc height loss with anterior osteophyte  formation at C5/6 and C6/7.  No fracture or dislocation seen.   PATIENT SURVEYS:  NDI:  NECK DISABILITY INDEX  Date: 03/29/24 Score  Pain intensity 1 = The pain is very mild at the moment  2. Personal care (washing, dressing, etc.) 0 = I can look after myself normally without causing extra pain  3. Lifting 1 =  I can lift heavy weights but it gives extra pain  4. Reading 1 = I can read as much as I want to with slight pain in my neck  5. Headaches 0 = I have no headaches at all  6. Concentration 0 =  I can concentrate fully when I want to with no difficulty  7. Work 1 =  I can only do my usual work, but no more  8. Driving 0 = I can drive my car without any neck pain  9. Sleeping 0 = I have no trouble sleeping  10. Recreation 0 = I am able to engage in all my recreation activities with no neck pain at all  Total 4/50   Minimum Detectable Change (90% confidence): 5 points or 10% points  COGNITION: Overall cognitive status: Within functional limits for tasks assessed  SENSATION: Patient reports no numbness or tingling  POSTURE: rounded shoulders and forward head  PALPATION: TTP: bilateral scapular stabilizers, right upper trapezius, levator scapulae, and cervical paraspinals   CERVICAL ROM: significant difficulty isolating cervical rotation both directions  Active ROM A/PROM (deg) eval  Flexion 48  Extension 42; sore  Right lateral flexion 32; tension   Left lateral flexion 36  Right rotation 40  Left rotation 45   (Blank rows = not tested)  UPPER EXTREMITY ROM: WFL for activities assessed  UPPER EXTREMITY MMT:  MMT Right eval Left eval  Shoulder flexion 4/5 4/5  Shoulder extension    Shoulder abduction 4/5 4/5  Shoulder adduction    Shoulder extension    Shoulder internal rotation    Shoulder external rotation    Middle trapezius    Lower trapezius    Elbow flexion 4/5 4+/5  Elbow extension 3+/5 4/5  Wrist flexion    Wrist extension    Wrist ulnar  deviation    Wrist radial deviation    Wrist pronation    Wrist supination    Grip strength 15 18   (Blank rows = not tested)  CERVICAL SPECIAL TESTS:  Spurling's test: Negative and Sharp pursor's test: Negative  TREATMENT DATE:    04/24/24:  Combo e'stim/US  at 1.50 W/CM2 x 10 minutes to cerv.paras.   STW/M x 14 minutes including  ischemic release technique   HMP and IFC at 80-150 Hz on 40% scan x 18 minutes.  Normal modality response following removal of modality.                                                                                           04/21/24:   Low-level combo e'stim/US  at 1.50 W/CM2 x 12 minutes (6 minutes to each UT) f/b STW/M x 11 minutes f/b  HMP and IFC at 80-150 Hz on 40% scan x 20 minutes. Normal modality response following removal of modality.                                                                                                                       04/17/24:  Low-level combo e'stim/US  at 1.50 W/CM2 x 12 minutes (6 minutes to each UT) f/b STW/M x 12 minutes f/b  HMP and IFC at 80-150 Hz on 40% scan x 20 minutes. Normal modality response following removal of modality.                                                                                                                        PATIENT EDUCATION:  Education details: plan for physical therapy, taking medications as prescribed Person educated: Patient Education method: Explanation Education comprehension: verbalized understanding  HOME EXERCISE PROGRAM:   Clinical impression statement:  Patient continues to progress and reports a subjective improvement rating of 30% less pain now in her neck. 4/10 today  OBJECTIVE IMPAIRMENTS: decreased mobility, decreased ROM, decreased strength, hypomobility, impaired tone, impaired UE functional use, postural dysfunction, and pain.   ACTIVITY LIMITATIONS: lifting and sleeping  PARTICIPATION LIMITATIONS: meal prep, cleaning, and laundry  PERSONAL  FACTORS: Past/current experiences, Time since onset of injury/illness/exacerbation, and 3+ comorbidities: Osteoporosis, anxiety, arthritis,history of COVID-19, depression, and allergies are also affecting patient's functional outcome.   REHAB POTENTIAL: Good  CLINICAL DECISION MAKING: Evolving/moderate complexity  EVALUATION COMPLEXITY: Moderate   GOALS: Goals reviewed with patient? Yes  LONG TERM GOALS: Target date: 04/26/24  Patient will be independent with her HEP. Baseline:  Goal status: MET  2.  Patient  will be able to demonstrate at least 50 degrees of cervical rotation bilaterally for improved awareness of her surroundings. Baseline:  Goal status: On going  3.  Patient will improve her right triceps strength to at least 4/5 for improved function reaching and picking up items at home. Baseline:  Goal status: On going  PLAN:  PT FREQUENCY: 1-2x/week  PT DURATION: 4 weeks  PLANNED INTERVENTIONS: 97164- PT Re-evaluation, 97750- Physical Performance Testing, 97110-Therapeutic exercises, 97530- Therapeutic activity, [id number]- Neuromuscular re-education, 97535- Self Care, 02859- Manual therapy, [id number]- Electrical stimulation (unattended), [id number]- Traction (mechanical), 20560 (1-2 muscles), 20561 (3+ muscles)- Dry Needling, Patient/Family education, Joint mobilization, Spinal mobilization, Cryotherapy, and Moist heat  PLAN FOR NEXT SESSION: UBE, manual therapy, scapular retraction, upper extremity strengthening, and modalities as needed   Norris Brumbach,CHRIS, PTA 04/28/2024, 12:46 PM

## 2024-05-02 ENCOUNTER — Ambulatory Visit: Admitting: Physical Therapy

## 2024-05-02 DIAGNOSIS — M542 Cervicalgia: Secondary | ICD-10-CM | POA: Diagnosis not present

## 2024-05-02 DIAGNOSIS — M79601 Pain in right arm: Secondary | ICD-10-CM

## 2024-05-02 DIAGNOSIS — M62838 Other muscle spasm: Secondary | ICD-10-CM

## 2024-05-02 NOTE — Therapy (Signed)
 OUTPATIENT PHYSICAL THERAPY CERVICAL TREATMENT   Patient Name: Brittany Hensley MRN: [id number] DOB:[birth year], 73 y.o., female Today's Date: 05/02/2024  END OF SESSION:  PT End of Session - 05/02/24 1146     Visit Number 9    Number of Visits 14    Date for Recertification  06/02/24    PT Start Time 1056    PT Stop Time 1144    PT Time Calculation (min) 48 min    Activity Tolerance Patient tolerated treatment well    Behavior During Therapy WFL for tasks assessed/performed            Past Medical History:  Diagnosis Date   Anemia    iron     Anxiety    Arthritis    COVID    2020 bad cold s/s lasted 1 week. 04/28/2021   Depression    Diverticulitis    Hx: of   GERD (gastroesophageal reflux disease)    at times for 15 yrs 04/28/2021   History of kidney stones    10 years ago   Hypothyroidism    Low iron     Pneumonia    2020   Rosacea    Hx: of   SCC (squamous cell carcinoma) 08/13/2021   in situ-left temple (CX35FU)   SCC (squamous cell carcinoma) 08/13/2021   well diff-right forehead (CX35FU)   Squamous cell carcinoma of skin 08/03/2021   in situ- left malar cheek (CX35FU)   Past Surgical History:  Procedure Laterality Date   APPENDECTOMY     BALLOON DILATION  06/16/2011   Procedure: BALLOON DILATION;  Surgeon: Oliva FORBES Boots, MD;  Location: [facility] ENDOSCOPY;  Service: Endoscopy;  Laterality: N/A;   BALLOON DILATION N/A 10/03/2013   Procedure: BALLOON DILATION;  Surgeon: Oliva FORBES Boots, MD;  Location: [facility] ENDOSCOPY;  Service: Endoscopy;  Laterality: N/A;   BALLOON DILATION N/A 08/10/2014   Procedure: BALLOON DILATION;  Surgeon: Oliva FORBES Boots, MD;  Location: [facility]COPY;  Service: Endoscopy;  Laterality: N/A;  from 12 - 15 cm dilation completed   BALLOON DILATION N/A 02/15/2015   Procedure: BALLOON DILATION;  Surgeon: Oliva Boots, MD;  Location: [facility];  Service: Endoscopy;  Laterality: N/A;   BALLOON DILATION N/A 11/12/2015   Procedure: BALLOON DILATION;   Surgeon: Oliva Boots, MD;  Location: [facility]OS[facility];  Service: Endoscopy;  Laterality: N/A;   BALLOON DILATION N/A 08/10/2016   Procedure: BALLOON DILATION;  Surgeon: Oliva Boots, MD;  Location: [facility] ENDOSCOPY;  Service: Endoscopy;  Laterality: N/A;   BALLOON DILATION N/A 08/07/2016   Procedure: BALLOON DILATION;  Surgeon: Oliva Boots, MD;  Location: [facility] ENDOSCOPY;  Service: Endoscopy;  Laterality: N/A;   BALLOON DILATION N/A 05/20/2017   Procedure: BALLOON DILATION;  Surgeon: Boots Oliva, MD;  Location: [facility];  Service: Endoscopy;  Laterality: N/A;   BALLOON DILATION N/A 12/02/2017   Procedure: BALLOON DILATION;  Surgeon: Boots Oliva, MD;  Location: [facility] ENDOSCOPY;  Service: Endoscopy;  Laterality: N/A;   BALLOON DILATION N/A 07/19/2018   Procedure: BALLOON DILATION;  Surgeon: Boots Oliva, MD;  Location: [facility];  Service: Endoscopy;  Laterality: N/A;   BALLOON DILATION N/A 01/23/2019   Procedure: BALLOON DILATION;  Surgeon: Boots Oliva, MD;  Location: [facility];  Service: Endoscopy;  Laterality: N/A;   BALLOON DILATION N/A 07/13/2019   Procedure: BALLOON DILATION;  Surgeon: Boots Oliva, MD;  Location: [facility];  Service: Endoscopy;  Laterality: N/A;   BALLOON DILATION N/A 02/02/2020   Procedure: BALLOON DILATION;  Surgeon:  Rosalie Kitchens, MD;  Location: [facility];  Service: Endoscopy;  Laterality: N/A;   BALLOON DILATION N/A 07/16/2020   Procedure: BALLOON DILATION;  Surgeon: Rosalie Kitchens, MD;  Location: [facility];  Service: Endoscopy;  Laterality: N/A;   BALLOON DILATION N/A 02/19/2021   Procedure: BALLOON DILATION;  Surgeon: Rosalie Kitchens, MD;  Location: [facility];  Service: Endoscopy;  Laterality: N/A;   BALLOON DILATION N/A 09/30/2021   Procedure: BALLOON DILATION;  Surgeon: Rosalie Kitchens, MD;  Location: [facility];  Service: Endoscopy;  Laterality: N/A;   BALLOON DILATION N/A 03/19/2022   Procedure: BALLOON DILATION;  Surgeon: Rosalie Kitchens, MD;  Location: [facility];   Service: Gastroenterology;  Laterality: N/A;   BALLOON DILATION N/A 05/05/2023   Procedure: BALLOON DILATION;  Surgeon: Rosalie Kitchens, MD;  Location: [facility];  Service: Gastroenterology;  Laterality: N/A;   CATARACT EXTRACTION, BILATERAL     2019   CHOLECYSTECTOMY OPEN  1979   COLONOSCOPY     Hx: of   CYSTOSCOPY/URETEROSCOPY/HOLMIUM LASER/STENT PLACEMENT Bilateral 05/06/2021   Procedure: CYSTOSCOPY BILATERAL RETROGRADE LEFT URETEROSCOPY/HOLMIUM LASER/STENT PLACEMENT;  Surgeon: Watt Rush, MD;  Location: [facility];  Service: Urology;  Laterality: Bilateral;   ESOPHAGOGASTRODUODENOSCOPY  06/16/2011   Procedure: ESOPHAGOGASTRODUODENOSCOPY (EGD);  Surgeon: Kitchens FORBES Rosalie, MD;  Location: [facility] ENDOSCOPY;  Service: Endoscopy;  Laterality: N/A;   ESOPHAGOGASTRODUODENOSCOPY N/A 10/03/2013   Procedure: ESOPHAGOGASTRODUODENOSCOPY (EGD);  Surgeon: Kitchens FORBES Rosalie, MD;  Location: [facility];  Service: Endoscopy;  Laterality: N/A;   ESOPHAGOGASTRODUODENOSCOPY N/A 08/10/2014   Procedure: ESOPHAGOGASTRODUODENOSCOPY (EGD);  Surgeon: Kitchens FORBES Rosalie, MD;  Location: [facility];  Service: Endoscopy;  Laterality: N/A;   ESOPHAGOGASTRODUODENOSCOPY N/A 08/10/2016   Procedure: ESOPHAGOGASTRODUODENOSCOPY (EGD);  Surgeon: Kitchens Rosalie, MD;  Location: [facility] ENDOSCOPY;  Service: Endoscopy;  Laterality: N/A;   ESOPHAGOGASTRODUODENOSCOPY N/A 07/19/2018   Procedure: ESOPHAGOGASTRODUODENOSCOPY (EGD);  Surgeon: Rosalie Kitchens, MD;  Location: [facility];  Service: Endoscopy;  Laterality: N/A;   ESOPHAGOGASTRODUODENOSCOPY (EGD) WITH PROPOFOL  N/A 02/15/2015   Procedure: ESOPHAGOGASTRODUODENOSCOPY (EGD) WITH PROPOFOL ;  Surgeon: Kitchens Rosalie, MD;  Location: [facility]OSCOPY;  Service: Endoscopy;  Laterality: N/A;   ESOPHAGOGASTRODUODENOSCOPY (EGD) WITH PROPOFOL  N/A 11/12/2015   Procedure: ESOPHAGOGASTRODUODENOSCOPY (EGD) WITH PROPOFOL ;  Surgeon: Kitchens Rosalie, MD;  Location: [facility];  Service: Endoscopy;  Laterality: N/A;    ESOPHAGOGASTRODUODENOSCOPY (EGD) WITH PROPOFOL  N/A 08/07/2016   Procedure: ESOPHAGOGASTRODUODENOSCOPY (EGD) WITH PROPOFOL ;  Surgeon: Kitchens Rosalie, MD;  Location: [facility] ENDOSCOPY;  Service: Endoscopy;  Laterality: N/A;   ESOPHAGOGASTRODUODENOSCOPY (EGD) WITH PROPOFOL  N/A 05/20/2017   Procedure: ESOPHAGOGASTRODUODENOSCOPY (EGD) WITH PROPOFOL ;  Surgeon: Rosalie Kitchens, MD;  Location: [facility];  Service: Endoscopy;  Laterality: N/A;   ESOPHAGOGASTRODUODENOSCOPY (EGD) WITH PROPOFOL  N/A 12/02/2017   Procedure: ESOPHAGOGASTRODUODENOSCOPY (EGD) WITH PROPOFOL ;  Surgeon: Rosalie Kitchens, MD;  Location: [facility] ENDOSCOPY;  Service: Endoscopy;  Laterality: N/A;  have c arm available   ESOPHAGOGASTRODUODENOSCOPY (EGD) WITH PROPOFOL  N/A 01/23/2019   Procedure: ESOPHAGOGASTRODUODENOSCOPY (EGD) WITH PROPOFOL ;  Surgeon: Rosalie Kitchens, MD;  Location: [facility];  Service: Endoscopy;  Laterality: N/A;   ESOPHAGOGASTRODUODENOSCOPY (EGD) WITH PROPOFOL  N/A 07/13/2019   Procedure: ESOPHAGOGASTRODUODENOSCOPY (EGD) WITH PROPOFOL ;  Surgeon: Rosalie Kitchens, MD;  Location: [facility];  Service: Endoscopy;  Laterality: N/A;   ESOPHAGOGASTRODUODENOSCOPY (EGD) WITH PROPOFOL  N/A 02/02/2020   Procedure: ESOPHAGOGASTRODUODENOSCOPY (EGD) WITH PROPOFOL ;  Surgeon: Rosalie Kitchens, MD;  Location: [facility];  Service: Endoscopy;  Laterality: N/A;   ESOPHAGOGASTRODUODENOSCOPY (EGD) WITH PROPOFOL  N/A 07/16/2020   Procedure: ESOPHAGOGASTRODUODENOSCOPY (EGD) WITH PROPOFOL ;  Surgeon: Rosalie Kitchens, MD;  Location: [facility];  Service: Endoscopy;  Laterality: N/A;   ESOPHAGOGASTRODUODENOSCOPY (EGD) WITH PROPOFOL  N/A 02/19/2021   Procedure: ESOPHAGOGASTRODUODENOSCOPY (EGD) WITH PROPOFOL ;  Surgeon: Rosalie Kitchens, MD;  Location: [facility];  Service: Endoscopy;  Laterality: N/A;  with possible botox    ESOPHAGOGASTRODUODENOSCOPY (EGD) WITH PROPOFOL  N/A 09/30/2021   Procedure: ESOPHAGOGASTRODUODENOSCOPY (EGD) WITH PROPOFOL ;  Surgeon: Rosalie Kitchens, MD;  Location: [facility]  ENDOS[facility];  Service: Endoscopy;  Laterality: N/A;   ESOPHAGOGASTRODUODENOSCOPY (EGD) WITH PROPOFOL  N/A 03/19/2022   Procedure: ESOPHAGOGASTRODUODENOSCOPY (EGD) WITH PROPOFOL ;  Surgeon: Rosalie Kitchens, MD;  Location: [facility];  Service: Gastroenterology;  Laterality: N/A;   ESOPHAGOGASTRODUODENOSCOPY (EGD) WITH PROPOFOL  N/A 05/05/2023   Procedure: ESOPHAGOGASTRODUODENOSCOPY (EGD) WITH PROPOFOL ;  Surgeon: Rosalie Kitchens, MD;  Location: [facility];  Service: Gastroenterology;  Laterality: N/A;  with balloon dilation   FOREIGN BODY REMOVAL  07/19/2018   Procedure: FOREIGN BODY REMOVAL;  Surgeon: Rosalie Kitchens, MD;  Location: [facility] ENDOSCOPY;  Service: Endoscopy;;   FRACTURE SURGERY     Hx: of left wrist 01/2021   GASTRECTOMY  [phone]   GASTRECTOMY N/A    X3   LITHOTRIPSY     Hx; of for kidney stones [id number]   SAVORY DILATION N/A 08/10/2014   Procedure: SAVORY DILATION;  Surgeon: Kitchens FORBES Rosalie, MD;  Location: [facility] ENDOS[facility];  Service: Endoscopy;  Laterality: N/A;   TOTAL HIP ARTHROPLASTY Right 12/30/2017   Procedure: RIGHT TOTAL HIP ARTHROPLASTY;  Surgeon: Margrette Taft FORBES, MD;  Location: [facility];  Service: Orthopedics;  Laterality: Right;   TOTAL HIP ARTHROPLASTY Left 02/17/2018   Procedure: TOTAL HIP ARTHROPLASTY;  Surgeon: Margrette Taft FORBES, MD;  Location: [facility];  Service: Orthopedics;  Laterality: Left;   TUBAL LIGATION     1975   Patient Active Problem List   Diagnosis Date Noted   Stricture of esophagus 08/21/2022   Closed nondisplaced oblique fracture of shaft of ulna with routine healing 03/20/2021   Closed fracture of lower end of right ulna 01/11/2021   Oral phase dysphagia 12/19/2020   Slow transit constipation 12/19/2020   Weight decreased 12/19/2020   Acquired hypertrophic pyloric stenosis 12/19/2020   Osteoporosis without pathological fracture 06/18/2020   Anemia, unspecified 06/27/2018   Age-related osteoporosis with current pathological fracture of right femur (HCC)  03/01/2018   Status post total hip replacement, left 02/17/18 02/17/2018   Closed displaced fracture of left femoral neck (HCC) 02/14/2018   S/P hip replacement, right 12/30/17 01/13/2018   Closed displaced fracture of right femoral neck (HCC) 12/30/2017   BMI less than 19,adult 01/10/2016   GAD (generalized anxiety disorder) 01/10/2016   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   Hypothyroidism 04/24/2013   GERD (gastroesophageal reflux disease) 04/24/2013   Rosacea 04/24/2013   Iron  deficiency anemia due to chronic blood loss 06/09/2012   Gastric outlet obstruction 06/16/2011   Atrioventricular nodal re-entry tachycardia (HCC) 03/11/2010   MURMUR 03/11/2010   PERSONAL HISTORY OF URINARY CALCULI 02/14/2008   REFERRING PROVIDER: Georgina Ozell LABOR, MD   REFERRING DIAG: Cervicalgia   THERAPY DIAG:  Cervicalgia  Other muscle spasm  Pain in right arm  Rationale for Evaluation and Treatment: Rehabilitation  ONSET DATE: 6-7 months  SUBJECTIVE:  SUBJECTIVE STATEMENT:   Pain down to a 2.  PERTINENT HISTORY:  Osteoporosis, anxiety, arthritis,history of COVID-19, depression, and allergies  PAIN:  Are you having pain? Yes: NPRS scale: no number given/10 Pain location: Bil neck   PRECAUTIONS: None  RED FLAGS: None    WEIGHT BEARING RESTRICTIONS: No  FALLS:  Has patient fallen in last 6 months? No  LIVING ENVIRONMENT: Lives with: lives alone Lives in: House/apartment  OCCUPATION: retired   PLOF: Independent  PATIENT GOALS: reduced pain and improved mobility  NEXT MD VISIT: only as needed  OBJECTIVE:  Note: Objective measures were completed at Evaluation unless otherwise noted.  DIAGNOSTIC FINDINGS: 03/22/24 cervical x-ray grade 1 spondylolisthesis at C5/6.  No  evidence of  instability on flexion/extension views.  Disc height loss with anterior osteophyte formation at C5/6 and C6/7.  No fracture or dislocation seen.   PATIENT SURVEYS:  NDI:  NECK DISABILITY INDEX  Date: 03/29/24 Score  Pain intensity 1 = The pain is very mild at the moment  2. Personal care (washing, dressing, etc.) 0 = I can look after myself normally without causing extra pain  3. Lifting 1 =  I can lift heavy weights but it gives extra pain  4. Reading 1 = I can read as much as I want to with slight pain in my neck  5. Headaches 0 = I have no headaches at all  6. Concentration 0 =  I can concentrate fully when I want to with no difficulty  7. Work 1 =  I can only do my usual work, but no more  8. Driving 0 = I can drive my car without any neck pain  9. Sleeping 0 = I have no trouble sleeping  10. Recreation 0 = I am able to engage in all my recreation activities with no neck pain at all  Total 4/50   Minimum Detectable Change (90% confidence): 5 points or 10% points  COGNITION: Overall cognitive status: Within functional limits for tasks assessed  SENSATION: Patient reports no numbness or tingling  POSTURE: rounded shoulders and forward head  PALPATION: TTP: bilateral scapular stabilizers, right upper trapezius, levator scapulae, and cervical paraspinals   CERVICAL ROM: significant difficulty isolating cervical rotation both directions  Active ROM A/PROM (deg) eval  Flexion 48  Extension 42; sore  Right lateral flexion 32; tension   Left lateral flexion 36  Right rotation 40  Left rotation 45   (Blank rows = not tested)  UPPER EXTREMITY ROM: WFL for activities assessed  UPPER EXTREMITY MMT:  MMT Right eval Left eval  Shoulder flexion 4/5 4/5  Shoulder extension    Shoulder abduction 4/5 4/5  Shoulder adduction    Shoulder extension    Shoulder internal rotation    Shoulder external rotation    Middle trapezius    Lower trapezius    Elbow  flexion 4/5 4+/5  Elbow extension 3+/5 4/5  Wrist flexion    Wrist extension    Wrist ulnar deviation    Wrist radial deviation    Wrist pronation    Wrist supination    Grip strength 15 18   (Blank rows = not tested)  CERVICAL SPECIAL TESTS:  Spurling's test: Negative and Sharp pursor's test: Negative  TREATMENT DATE:    05/02/24:  Low-level combo e'stim/US  at 1.50 W/CM2 x 12 minutes (6 minutes to each UT) f/b STW/M x 12 minutes f/b  HMP and IFC at 80-150 Hz on 40% scan x 20 minutes. Normal  modality response following removal of modality.       04/24/24:  Combo e'stim/US  at 1.50 W/CM2 x 10 minutes to cerv.paras.   STW/M x 14 minutes including ischemic release technique   HMP and IFC at 80-150 Hz on 40% scan x 18 minutes.  Normal modality response following removal of modality.                                                                                           04/21/24:   Low-level combo e'stim/US  at 1.50 W/CM2 x 12 minutes (6 minutes to each UT) f/b STW/M x 11 minutes f/b  HMP and IFC at 80-150 Hz on 40% scan x 20 minutes. Normal modality response following removal of modality.                                                                                                                       04/17/24:  Low-level combo e'stim/US  at 1.50 W/CM2 x 12 minutes (6 minutes to each UT) f/b STW/M x 12 minutes f/b  HMP and IFC at 80-150 Hz on 40% scan x 20 minutes. Normal modality response following removal of modality.                                                                                                                        PATIENT EDUCATION:  Education details: plan for physical therapy, taking medications as prescribed Person educated: Patient Education method: Explanation Education comprehension: verbalized understanding  HOME EXERCISE PROGRAM:   Clinical impression statement:  Patient very pleased with progress and states treatments have helped a lot.    OBJECTIVE  IMPAIRMENTS: decreased mobility, decreased ROM, decreased strength, hypomobility, impaired tone, impaired UE functional use, postural dysfunction, and pain.   ACTIVITY LIMITATIONS: lifting and sleeping  PARTICIPATION LIMITATIONS: meal prep, cleaning, and laundry  PERSONAL FACTORS: Past/current experiences, Time since onset of injury/illness/exacerbation, and 3+ comorbidities: Osteoporosis, anxiety, arthritis,history of COVID-19, depression, and allergies are also affecting patient's functional outcome.   REHAB POTENTIAL: Good  CLINICAL DECISION MAKING: Evolving/moderate complexity  EVALUATION COMPLEXITY: Moderate   GOALS: Goals reviewed with patient?  Yes  LONG TERM GOALS: Target date: 04/26/24  Patient will be independent with her HEP. Baseline:  Goal status: MET  2.  Patient will be able to demonstrate at least 50 degrees of cervical rotation bilaterally for improved awareness of her surroundings. Baseline:  Goal status: On going  3.  Patient will improve her right triceps strength to at least 4/5 for improved function reaching and picking up items at home. Baseline:  Goal status: On going  PLAN:  PT FREQUENCY: 1-2x/week  PT DURATION: 4 weeks  PLANNED INTERVENTIONS: 97164- PT Re-evaluation, 97750- Physical Performance Testing, 97110-Therapeutic exercises, 97530- Therapeutic activity, [id number]- Neuromuscular re-education, 97535- Self Care, 02859- Manual therapy, [id number]- Electrical stimulation (unattended), 02987- Traction (mechanical), 20560 (1-2 muscles), 20561 (3+ muscles)- Dry Needling, Patient/Family education, Joint mobilization, Spinal mobilization, Cryotherapy, and Moist heat  PLAN FOR NEXT SESSION: UBE, manual therapy, scapular retraction, upper extremity strengthening, and modalities as needed   Angell Pincock, [place], PT 05/02/2024, 11:54 AM

## 2024-05-05 ENCOUNTER — Ambulatory Visit: Attending: Orthopedic Surgery | Admitting: Physical Therapy

## 2024-05-05 DIAGNOSIS — M62838 Other muscle spasm: Secondary | ICD-10-CM | POA: Insufficient documentation

## 2024-05-05 DIAGNOSIS — M79601 Pain in right arm: Secondary | ICD-10-CM | POA: Diagnosis not present

## 2024-05-05 DIAGNOSIS — M542 Cervicalgia: Secondary | ICD-10-CM | POA: Diagnosis not present

## 2024-05-05 NOTE — Therapy (Signed)
 OUTPATIENT PHYSICAL THERAPY CERVICAL TREATMENT   Patient Name: Brittany Hensley MRN: [id number] DOB:[birth year], 73 y.o., female Today's Date: 05/05/2024  END OF SESSION:  PT End of Session - 05/05/24 1234     Visit Number 10    Number of Visits 14    Date for Recertification  06/02/24    PT Start Time 1045    PT Stop Time 1138    PT Time Calculation (min) 53 min    Activity Tolerance Patient tolerated treatment well    Behavior During Therapy [facility] for tasks assessed/performed             Past Medical History:  Diagnosis Date   Anemia    iron     Anxiety    Arthritis    COVID    2020 bad cold s/s lasted 1 week. 04/28/2021   Depression    Diverticulitis    Hx: of   GERD (gastroesophageal reflux disease)    at times for 15 yrs 04/28/2021   History of kidney stones    10 years ago   Hypothyroidism    Low iron     Pneumonia    2020   Rosacea    Hx: of   SCC (squamous cell carcinoma) 08/13/2021   in situ-left temple (CX35FU)   SCC (squamous cell carcinoma) 08/13/2021   well diff-right forehead (CX35FU)   Squamous cell carcinoma of skin 08/03/2021   in situ- left malar cheek (CX35FU)   Past Surgical History:  Procedure Laterality Date   APPENDECTOMY     BALLOON DILATION  06/16/2011   Procedure: BALLOON DILATION;  Surgeon: Oliva FORBES Boots, MD;  Location: [facility] ENDOSCOPY;  Service: Endoscopy;  Laterality: N/A;   BALLOON DILATION N/A 10/03/2013   Procedure: BALLOON DILATION;  Surgeon: Oliva FORBES Boots, MD;  Location: [facility] ENDOSCOPY;  Service: Endoscopy;  Laterality: N/A;   BALLOON DILATION N/A 08/10/2014   Procedure: BALLOON DILATION;  Surgeon: Oliva FORBES Boots, MD;  Location: [facility] ENDOSCOPY;  Service: Endoscopy;  Laterality: N/A;  from 12 - 15 cm dilation completed   BALLOON DILATION N/A 02/15/2015   Procedure: BALLOON DILATION;  Surgeon: Oliva Boots, MD;  Location: [facility] ENDOSCOPY;  Service: Endoscopy;  Laterality: N/A;   BALLOON DILATION N/A 11/12/2015   Procedure: BALLOON DILATION;   Surgeon: Oliva Boots, MD;  Location: [facility] ENDOSCOPY;  Service: Endoscopy;  Laterality: N/A;   BALLOON DILATION N/A 08/10/2016   Procedure: BALLOON DILATION;  Surgeon: Oliva Boots, MD;  Location: [facility] ENDOSCOPY;  Service: Endoscopy;  Laterality: N/A;   BALLOON DILATION N/A 08/07/2016   Procedure: BALLOON DILATION;  Surgeon: Oliva Boots, MD;  Location: [facility] ENDOSCOPY;  Service: Endoscopy;  Laterality: N/A;   BALLOON DILATION N/A 05/20/2017   Procedure: BALLOON DILATION;  Surgeon: Boots Oliva, MD;  Location: [facility] ENDOSCOPY;  Service: Endoscopy;  Laterality: N/A;   BALLOON DILATION N/A 12/02/2017   Procedure: BALLOON DILATION;  Surgeon: Boots Oliva, MD;  Location: [facility] ENDOSCOPY;  Service: Endoscopy;  Laterality: N/A;   BALLOON DILATION N/A 07/19/2018   Procedure: BALLOON DILATION;  Surgeon: Boots Oliva, MD;  Location: [facility] ENDOSCOPY;  Service: Endoscopy;  Laterality: N/A;   BALLOON DILATION N/A 01/23/2019   Procedure: BALLOON DILATION;  Surgeon: Boots Oliva, MD;  Location: [facility] ENDOSCOPY;  Service: Endoscopy;  Laterality: N/A;   BALLOON DILATION N/A 07/13/2019   Procedure: BALLOON DILATION;  Surgeon: Boots Oliva, MD;  Location: [facility]OSCOPY;  Service: Endoscopy;  Laterality: N/A;   BALLOON DILATION N/A 02/02/2020   Procedure: BALLOON DILATION;  Surgeon: Rosalie Kitchens, MD;  Location: [facility];  Service: Endoscopy;  Laterality: N/A;   BALLOON DILATION N/A 07/16/2020   Procedure: BALLOON DILATION;  Surgeon: Rosalie Kitchens, MD;  Location: [facility] ENDOSCOPY;  Service: Endoscopy;  Laterality: N/A;   BALLOON DILATION N/A 02/19/2021   Procedure: BALLOON DILATION;  Surgeon: Rosalie Kitchens, MD;  Location: [facility]OSCOPY;  Service: Endoscopy;  Laterality: N/A;   BALLOON DILATION N/A 09/30/2021   Procedure: BALLOON DILATION;  Surgeon: Rosalie Kitchens, MD;  Location: [facility]OSCOPY;  Service: Endoscopy;  Laterality: N/A;   BALLOON DILATION N/A 03/19/2022   Procedure: BALLOON DILATION;  Surgeon: Rosalie Kitchens, MD;  Location: [facility] ENDOSCOPY;   Service: Gastroenterology;  Laterality: N/A;   BALLOON DILATION N/A 05/05/2023   Procedure: BALLOON DILATION;  Surgeon: Rosalie Kitchens, MD;  Location: [facility]OSCOPY;  Service: Gastroenterology;  Laterality: N/A;   CATARACT EXTRACTION, BILATERAL     2019   CHOLECYSTECTOMY OPEN  1979   COLONOSCOPY     Hx: of   CYSTOSCOPY/URETEROSCOPY/HOLMIUM LASER/STENT PLACEMENT Bilateral 05/06/2021   Procedure: CYSTOSCOPY BILATERAL RETROGRADE LEFT URETEROSCOPY/HOLMIUM LASER/STENT PLACEMENT;  Surgeon: Watt Rush, MD;  Location: [facility];  Service: Urology;  Laterality: Bilateral;   ESOPHAGOGASTRODUODENOSCOPY  06/16/2011   Procedure: ESOPHAGOGASTRODUODENOSCOPY (EGD);  Surgeon: Kitchens FORBES Rosalie, MD;  Location: [facility] ENDOSCOPY;  Service: Endoscopy;  Laterality: N/A;   ESOPHAGOGASTRODUODENOSCOPY N/A 10/03/2013   Procedure: ESOPHAGOGASTRODUODENOSCOPY (EGD);  Surgeon: Kitchens FORBES Rosalie, MD;  Location: [facility];  Service: Endoscopy;  Laterality: N/A;   ESOPHAGOGASTRODUODENOSCOPY N/A 08/10/2014   Procedure: ESOPHAGOGASTRODUODENOSCOPY (EGD);  Surgeon: Kitchens FORBES Rosalie, MD;  Location: [facility];  Service: Endoscopy;  Laterality: N/A;   ESOPHAGOGASTRODUODENOSCOPY N/A 08/10/2016   Procedure: ESOPHAGOGASTRODUODENOSCOPY (EGD);  Surgeon: Kitchens Rosalie, MD;  Location: [facility] ENDOSCOPY;  Service: Endoscopy;  Laterality: N/A;   ESOPHAGOGASTRODUODENOSCOPY N/A 07/19/2018   Procedure: ESOPHAGOGASTRODUODENOSCOPY (EGD);  Surgeon: Rosalie Kitchens, MD;  Location: [facility];  Service: Endoscopy;  Laterality: N/A;   ESOPHAGOGASTRODUODENOSCOPY (EGD) WITH PROPOFOL  N/A 02/15/2015   Procedure: ESOPHAGOGASTRODUODENOSCOPY (EGD) WITH PROPOFOL ;  Surgeon: Kitchens Rosalie, MD;  Location: [facility] ENDOSCOPY;  Service: Endoscopy;  Laterality: N/A;   ESOPHAGOGASTRODUODENOSCOPY (EGD) WITH PROPOFOL  N/A 11/12/2015   Procedure: ESOPHAGOGASTRODUODENOSCOPY (EGD) WITH PROPOFOL ;  Surgeon: Kitchens Rosalie, MD;  Location: [facility] ENDOSCOPY;  Service: Endoscopy;  Laterality: N/A;    ESOPHAGOGASTRODUODENOSCOPY (EGD) WITH PROPOFOL  N/A 08/07/2016   Procedure: ESOPHAGOGASTRODUODENOSCOPY (EGD) WITH PROPOFOL ;  Surgeon: Kitchens Rosalie, MD;  Location[facility] ENDOSCOPY;  Service: Endoscopy;  Laterality: N/A;   ESOPHAGOGASTRODUODENOSCOPY (EGD) WITH PROPOFOL  N/A 05/20/2017   Procedure: ESOPHAGOGASTRODUODENOSCOPY (EGD) WITH PROPOFOL ;  Surgeon: Rosalie Kitchens, MD;  Location: [facility] ENDOSCOPY;  Service: Endoscopy;  Laterality: N/A;   ESOPHAGOGASTRODUODENOSCOPY (EGD) WITH PROPOFOL  N/A 12/02/2017   Procedure: ESOPHAGOGASTRODUODENOSCOPY (EGD) WITH PROPOFOL ;  Surgeon: Rosalie Kitchens, MD;  Location: [facility] ENDOSCOPY;  Service: Endoscopy;  Laterality: N/A;  have c arm available   ESOPHAGOGASTRODUODENOSCOPY (EGD) WITH PROPOFOL  N/A 01/23/2019   Procedure: ESOPHAGOGASTRODUODENOSCOPY (EGD) WITH PROPOFOL ;  Surgeon: Rosalie Kitchens, MD;  Location: [facility] ENDOSCOPY;  Service: Endoscopy;  Laterality: N/A;   ESOPHAGOGASTRODUODENOSCOPY (EGD) WITH PROPOFOL  N/A 07/13/2019   Procedure: ESOPHAGOGASTRODUODENOSCOPY (EGD) WITH PROPOFOL ;  Surgeon: Rosalie Kitchens, MD;  Location: [facility]OS[facility];  Service: Endoscopy;  Laterality: N/A;   ESOPHAGOGASTRODUODENOSCOPY (EGD) WITH PROPOFOL  N/A 02/02/2020   Procedure: ESOPHAGOGASTRODUODENOSCOPY (EGD) WITH PROPOFOL ;  Surgeon: Rosalie Kitchens, MD;  Location: [facility] ENDOS[facility];  Service: Endoscopy;  Laterality: N/A;   ESOPHAGOGASTRODUODENOSCOPY (EGD) WITH PROPOFOL  N/A 07/16/2020   Procedure: ESOPHAGOGASTRODUODENOSCOPY (EGD) WITH PROPOFOL ;  Surgeon: Rosalie Kitchens, MD;  Location:  [facility] ENDOSCOPY;  Service: Endoscopy;  Laterality: N/A;   ESOPHAGOGASTRODUODENOSCOPY (EGD) WITH PROPOFOL  N/A 02/19/2021   Procedure: ESOPHAGOGASTRODUODENOSCOPY (EGD) WITH PROPOFOL ;  Surgeon: Rosalie Kitchens, MD;  Location: [facility] ENDOS[facility];  Service: Endoscopy;  Laterality: N/A;  with possible botox    ESOPHAGOGASTRODUODENOSCOPY (EGD) WITH PROPOFOL  N/A 09/30/2021   Procedure: ESOPHAGOGASTRODUODENOSCOPY (EGD) WITH PROPOFOL ;  Surgeon: Rosalie Kitchens, MD;  Location: [facility]  ENDOSCOPY;  Service: Endoscopy;  Laterality: N/A;   ESOPHAGOGASTRODUODENOSCOPY (EGD) WITH PROPOFOL  N/A 03/19/2022   Procedure: ESOPHAGOGASTRODUODENOSCOPY (EGD) WITH PROPOFOL ;  Surgeon: Rosalie Kitchens, MD;  Location: [facility] ENDOSCOPY;  Service: Gastroenterology;  Laterality: N/A;   ESOPHAGOGASTRODUODENOSCOPY (EGD) WITH PROPOFOL  N/A 05/05/2023   Procedure: ESOPHAGOGASTRODUODENOSCOPY (EGD) WITH PROPOFOL ;  Surgeon: Rosalie Kitchens, MD;  Location: [facility] ENDOSCOPY;  Service: Gastroenterology;  Laterality: N/A;  with balloon dilation   FOREIGN BODY REMOVAL  07/19/2018   Procedure: FOREIGN BODY REMOVAL;  Surgeon: Rosalie Kitchens, MD;  Location: [facility] ENDOSCOPY;  Service: Endoscopy;;   FRACTURE SURGERY     Hx: of left wrist 01/2021   GASTRECTOMY  [phone]   GASTRECTOMY N/A    X3   LITHOTRIPSY     Hx; of for kidney stones [id number]   SAVORY DILATION N/A 08/10/2014   Procedure: SAVORY DILATION;  Surgeon: Kitchens FORBES Rosalie, MD;  Location: [facility] ENDOSCOPY;  Service: Endoscopy;  Laterality: N/A;   TOTAL HIP ARTHROPLASTY Right 12/30/2017   Procedure: RIGHT TOTAL HIP ARTHROPLASTY;  Surgeon: Margrette Taft FORBES, MD;  Location: [facility];  Service: Orthopedics;  Laterality: Right;   TOTAL HIP ARTHROPLASTY Left 02/17/2018   Procedure: TOTAL HIP ARTHROPLASTY;  Surgeon: Margrette Taft FORBES, MD;  Location: [facility];  Service: Orthopedics;  Laterality: Left;   TUBAL LIGATION     1975   Patient Active Problem List   Diagnosis Date Noted   Stricture of esophagus 08/21/2022   Closed nondisplaced oblique fracture of shaft of ulna with routine healing 03/20/2021   Closed fracture of lower end of right ulna 01/11/2021   Oral phase dysphagia 12/19/2020   Slow transit constipation 12/19/2020   Weight decreased 12/19/2020   Acquired hypertrophic pyloric stenosis 12/19/2020   Osteoporosis without pathological fracture 06/18/2020   Anemia, unspecified 06/27/2018   Age-related osteoporosis with current pathological fracture of right femur (HCC)  03/01/2018   Status post total hip replacement, left 02/17/18 02/17/2018   Closed displaced fracture of left femoral neck (HCC) 02/14/2018   S/P hip replacement, right 12/30/17 01/13/2018   Closed displaced fracture of right femoral neck (HCC) 12/30/2017   BMI less than 19,adult 01/10/2016   GAD (generalized anxiety disorder) 01/10/2016   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   Hypothyroidism 04/24/2013   GERD (gastroesophageal reflux disease) 04/24/2013   Rosacea 04/24/2013   Iron  deficiency anemia due to chronic blood loss 06/09/2012   Gastric outlet obstruction 06/16/2011   Atrioventricular nodal re-entry tachycardia (HCC) 03/11/2010   MURMUR 03/11/2010   PERSONAL HISTORY OF URINARY CALCULI 02/14/2008   REFERRING PROVIDER: Georgina Ozell LABOR, MD   REFERRING DIAG: Cervicalgia   THERAPY DIAG:  Cervicalgia  Other muscle spasm  Pain in right arm  Rationale for Evaluation and Treatment: Rehabilitation  ONSET DATE: 6-7 months  SUBJECTIVE:  SUBJECTIVE STATEMENT:   Pain up to a 5 today.  PERTINENT HISTORY:  Osteoporosis, anxiety, arthritis,history of COVID-19, depression, and allergies  PAIN:  Are you having pain? Yes: NPRS scale: 5/10 Pain location: Bil neck   PRECAUTIONS: None  RED FLAGS: None    WEIGHT BEARING RESTRICTIONS: No  FALLS:  Has patient fallen in last 6 months? No  LIVING ENVIRONMENT: Lives with: lives alone Lives in: House/apartment  OCCUPATION: retired   PLOF: Independent  PATIENT GOALS: reduced pain and improved mobility  NEXT MD VISIT: only as needed  OBJECTIVE:  Note: Objective measures were completed at Evaluation unless otherwise noted.  DIAGNOSTIC FINDINGS: 03/22/24 cervical x-ray grade 1 spondylolisthesis at C5/6.  No evidence of   instability on flexion/extension views.  Disc height loss with anterior osteophyte formation at C5/6 and C6/7.  No fracture or dislocation seen.   PATIENT SURVEYS:  NDI:  NECK DISABILITY INDEX  Date: 03/29/24 Score  Pain intensity 1 = The pain is very mild at the moment  2. Personal care (washing, dressing, etc.) 0 = I can look after myself normally without causing extra pain  3. Lifting 1 =  I can lift heavy weights but it gives extra pain  4. Reading 1 = I can read as much as I want to with slight pain in my neck  5. Headaches 0 = I have no headaches at all  6. Concentration 0 =  I can concentrate fully when I want to with no difficulty  7. Work 1 =  I can only do my usual work, but no more  8. Driving 0 = I can drive my car without any neck pain  9. Sleeping 0 = I have no trouble sleeping  10. Recreation 0 = I am able to engage in all my recreation activities with no neck pain at all  Total 4/50   Minimum Detectable Change (90% confidence): 5 points or 10% points  COGNITION: Overall cognitive status: Within functional limits for tasks assessed  SENSATION: Patient reports no numbness or tingling  POSTURE: rounded shoulders and forward head  PALPATION: TTP: bilateral scapular stabilizers, right upper trapezius, levator scapulae, and cervical paraspinals   CERVICAL ROM: significant difficulty isolating cervical rotation both directions  Active ROM A/PROM (deg) eval  Flexion 48  Extension 42; sore  Right lateral flexion 32; tension   Left lateral flexion 36  Right rotation 40  Left rotation 45   (Blank rows = not tested)  UPPER EXTREMITY ROM: WFL for activities assessed  UPPER EXTREMITY MMT:  MMT Right eval Left eval  Shoulder flexion 4/5 4/5  Shoulder extension    Shoulder abduction 4/5 4/5  Shoulder adduction    Shoulder extension    Shoulder internal rotation    Shoulder external rotation    Middle trapezius    Lower trapezius    Elbow flexion 4/5 4+/5   Elbow extension 3+/5 4/5  Wrist flexion    Wrist extension    Wrist ulnar deviation    Wrist radial deviation    Wrist pronation    Wrist supination    Grip strength 15 18   (Blank rows = not tested)  CERVICAL SPECIAL TESTS:  Spurling's test: Negative and Sharp pursor's test: Negative  TREATMENT DATE:    05/02/24:  Low-level combo e'stim/US  at 1.50 W/CM2 x 12 minutes (6 minutes to each upper scapular region) f/b STW/M x 12 minutes f/b  HMP and IFC at 80-150 Hz on 40% scan x 20 minutes.  Normal modality response following removal of modality.       04/24/24:  Combo e'stim/US  at 1.50 W/CM2 x 10 minutes to cerv.paras.   STW/M x 14 minutes including ischemic release technique   HMP and IFC at 80-150 Hz on 40% scan x 18 minutes.  Normal modality response following removal of modality.                                                                                                                                                                                                             PATIENT EDUCATION:  Education details: plan for physical therapy, taking medications as prescribed Person educated: Patient Education method: Explanation Education comprehension: verbalized understanding  HOME EXERCISE PROGRAM:   Clinical impression statement:  Patient with increased pain today for no apparent reason.  She c/o pain over bilateral upper scap/lev scap area today.  Following treatment she had a significant reduction in pain.    OBJECTIVE IMPAIRMENTS: decreased mobility, decreased ROM, decreased strength, hypomobility, impaired tone, impaired UE functional use, postural dysfunction, and pain.   ACTIVITY LIMITATIONS: lifting and sleeping  PARTICIPATION LIMITATIONS: meal prep, cleaning, and laundry  PERSONAL FACTORS: Past/current experiences, Time since onset of injury/illness/exacerbation, and 3+ comorbidities: Osteoporosis, anxiety, arthritis,history of COVID-19, depression, and  allergies are also affecting patient's functional outcome.   REHAB POTENTIAL: Good  CLINICAL DECISION MAKING: Evolving/moderate complexity  EVALUATION COMPLEXITY: Moderate   GOALS: Goals reviewed with patient? Yes  LONG TERM GOALS: Target date: 04/26/24  Patient will be independent with her HEP. Baseline:  Goal status: Partially met.  2.  Patient will be able to demonstrate at least 50 degrees of cervical rotation bilaterally for improved awareness of her surroundings. Baseline: Bilateral 45 degrees. Goal status: Partially met.  3.  Patient will improve her right triceps strength to at least 4/5 for improved function reaching and picking up items at home. Baseline:  Goal status: On going  PLAN:  PT FREQUENCY: 1-2x/week  PT DURATION: 4 weeks  PLANNED INTERVENTIONS: 02835- PT Re-evaluation, 97750- Physical Performance Testing, 97110-Therapeutic exercises, 97530- Therapeutic activity, [id number]- Neuromuscular re-education, 97535- Self Care, 02859- Manual therapy, G0283- Electrical stimulation (unattended), [phone]- Traction (mechanical), 20560 (1-2 muscles), 20561 (3+ muscles)- Dry Needling, Patient/Family education, Joint mobilization, Spinal mobilization, Cryotherapy, and Moist heat  PLAN FOR NEXT SESSION: UBE, manual therapy, scapular retraction, upper extremity strengthening, and modalities as needed  Progress Note Reporting Period 03/29/24 to 05/05/24  See note below for Objective Data and Assessment of Progress/Goals. Patient pleased with progress and is progressing toward goals.  Cleveland Yarbro, [place], PT 05/05/2024, 12:36 PM

## 2024-05-09 ENCOUNTER — Ambulatory Visit: Admitting: *Deleted

## 2024-05-09 ENCOUNTER — Encounter: Payer: Self-pay | Admitting: *Deleted

## 2024-05-09 DIAGNOSIS — M79601 Pain in right arm: Secondary | ICD-10-CM

## 2024-05-09 DIAGNOSIS — M542 Cervicalgia: Secondary | ICD-10-CM | POA: Diagnosis not present

## 2024-05-09 DIAGNOSIS — M62838 Other muscle spasm: Secondary | ICD-10-CM

## 2024-05-09 NOTE — Therapy (Signed)
 OUTPATIENT PHYSICAL THERAPY CERVICAL TREATMENT   Patient Name: Brittany Hensley MRN: [id number] DOB:Jul 21, 1951, 73 y.o., female Today's Date: 05/09/2024  END OF SESSION:  PT End of Session - 05/09/24 1257     Visit Number 11    Number of Visits 14    Date for Recertification  06/02/24    PT Start Time 1100    PT Stop Time 1150    PT Time Calculation (min) 50 min             Past Medical History:  Diagnosis Date   Anemia    iron     Anxiety    Arthritis    COVID    2020 bad cold s/s lasted 1 week. 04/28/2021   Depression    Diverticulitis    Hx: of   GERD (gastroesophageal reflux disease)    at times for 15 yrs 04/28/2021   History of kidney stones    10 years ago   Hypothyroidism    Low iron     Pneumonia    2020   Rosacea    Hx: of   SCC (squamous cell carcinoma) 08/13/2021   in situ-left temple (CX35FU)   SCC (squamous cell carcinoma) 08/13/2021   well diff-right forehead (CX35FU)   Squamous cell carcinoma of skin 08/03/2021   in situ- left malar cheek (CX35FU)   Past Surgical History:  Procedure Laterality Date   APPENDECTOMY     BALLOON DILATION  06/16/2011   Procedure: BALLOON DILATION;  Surgeon: Oliva FORBES Boots, MD;  Location: [facility] ENDOSCOPY;  Service: Endoscopy;  Laterality: N/A;   BALLOON DILATION N/A 10/03/2013   Procedure: BALLOON DILATION;  Surgeon: Oliva FORBES Boots, MD;  Location: [facility] ENDOSCOPY;  Service: Endoscopy;  Laterality: N/A;   BALLOON DILATION N/A 08/10/2014   Procedure: BALLOON DILATION;  Surgeon: Oliva FORBES Boots, MD;  Location: [facility];  Service: Endoscopy;  Laterality: N/A;  from 12 - 15 cm dilation completed   BALLOON DILATION N/A 02/15/2015   Procedure: BALLOON DILATION;  Surgeon: Oliva Boots, MD;  Location: [facility]OSCOPY;  Service: Endoscopy;  Laterality: N/A;   BALLOON DILATION N/A 11/12/2015   Procedure: BALLOON DILATION;  Surgeon: Oliva Boots, MD;  Location: [facility];  Service: Endoscopy;  Laterality: N/A;   BALLOON DILATION N/A  08/10/2016   Procedure: BALLOON DILATION;  Surgeon: Oliva Boots, MD;  Location: [facility] ENDOSCOPY;  Service: Endoscopy;  Laterality: N/A;   BALLOON DILATION N/A 08/07/2016   Procedure: BALLOON DILATION;  Surgeon: Oliva Boots, MD;  Location: [facility] ENDOSCOPY;  Service: Endoscopy;  Laterality: N/A;   BALLOON DILATION N/A 05/20/2017   Procedure: BALLOON DILATION;  Surgeon: Boots Oliva, MD;  Location: [facility]OSCOPY;  Service: Endoscopy;  Laterality: N/A;   BALLOON DILATION N/A 12/02/2017   Procedure: BALLOON DILATION;  Surgeon: Boots Oliva, MD;  Location: [facility] ENDOSCOPY;  Service: Endoscopy;  Laterality: N/A;   BALLOON DILATION N/A 07/19/2018   Procedure: BALLOON DILATION;  Surgeon: Boots Oliva, MD;  Location: [facility] ENDOSCOPY;  Service: Endoscopy;  Laterality: N/A;   BALLOON DILATION N/A 01/23/2019   Procedure: BALLOON DILATION;  Surgeon: Boots Oliva, MD;  Location: [facility]OSCOPY;  Service: Endoscopy;  Laterality: N/A;   BALLOON DILATION N/A 07/13/2019   Procedure: BALLOON DILATION;  Surgeon: Boots Oliva, MD;  Location: [facility]OS[facility];  Service: Endoscopy;  Laterality: N/A;   BALLOON DILATION N/A 02/02/2020   Procedure: BALLOON DILATION;  Surgeon: Boots Oliva, MD;  Location: [facility]OS[facility];  Service: Endoscopy;  Laterality: N/A;   BALLOON DILATION N/A  07/16/2020   Procedure: BALLOON DILATION;  Surgeon: Rosalie Kitchens, MD;  Location: [facility];  Service: Endoscopy;  Laterality: N/A;   BALLOON DILATION N/A 02/19/2021   Procedure: BALLOON DILATION;  Surgeon: Rosalie Kitchens, MD;  Location: [facility]OS[facility];  Service: Endoscopy;  Laterality: N/A;   BALLOON DILATION N/A 09/30/2021   Procedure: BALLOON DILATION;  Surgeon: Rosalie Kitchens, MD;  Location: [facility]OS[facility];  Service: Endoscopy;  Laterality: N/A;   BALLOON DILATION N/A 03/19/2022   Procedure: BALLOON DILATION;  Surgeon: Rosalie Kitchens, MD;  Location: [facility];  Service: Gastroenterology;  Laterality: N/A;   BALLOON DILATION N/A 05/05/2023   Procedure: BALLOON DILATION;   Surgeon: Rosalie Kitchens, MD;  Location: [facility]OS[facility];  Service: Gastroenterology;  Laterality: N/A;   CATARACT EXTRACTION, BILATERAL     2019   CHOLECYSTECTOMY OPEN  1979   COLONOSCOPY     Hx: of   CYSTOSCOPY/URETEROSCOPY/HOLMIUM LASER/STENT PLACEMENT Bilateral 05/06/2021   Procedure: CYSTOSCOPY BILATERAL RETROGRADE LEFT URETEROSCOPY/HOLMIUM LASER/STENT PLACEMENT;  Surgeon: Watt Rush, MD;  Location: [facility];  Service: Urology;  Laterality: Bilateral;   ESOPHAGOGASTRODUODENOSCOPY  06/16/2011   Procedure: ESOPHAGOGASTRODUODENOSCOPY (EGD);  Surgeon: Kitchens FORBES Rosalie, MD;  Location: [facility] ENDOSCOPY;  Service: Endoscopy;  Laterality: N/A;   ESOPHAGOGASTRODUODENOSCOPY N/A 10/03/2013   Procedure: ESOPHAGOGASTRODUODENOSCOPY (EGD);  Surgeon: Kitchens FORBES Rosalie, MD;  Location: [facility];  Service: Endoscopy;  Laterality: N/A;   ESOPHAGOGASTRODUODENOSCOPY N/A 08/10/2014   Procedure: ESOPHAGOGASTRODUODENOSCOPY (EGD);  Surgeon: Kitchens FORBES Rosalie, MD;  Location: [facility];  Service: Endoscopy;  Laterality: N/A;   ESOPHAGOGASTRODUODENOSCOPY N/A 08/10/2016   Procedure: ESOPHAGOGASTRODUODENOSCOPY (EGD);  Surgeon: Kitchens Rosalie, MD;  Location: [facility] ENDOSCOPY;  Service: Endoscopy;  Laterality: N/A;   ESOPHAGOGASTRODUODENOSCOPY N/A 07/19/2018   Procedure: ESOPHAGOGASTRODUODENOSCOPY (EGD);  Surgeon: Rosalie Kitchens, MD;  Location: [facility];  Service: Endoscopy;  Laterality: N/A;   ESOPHAGOGASTRODUODENOSCOPY (EGD) WITH PROPOFOL  N/A 02/15/2015   Procedure: ESOPHAGOGASTRODUODENOSCOPY (EGD) WITH PROPOFOL ;  Surgeon: Kitchens Rosalie, MD;  Location: [facility] ENDOSCOPY;  Service: Endoscopy;  Laterality: N/A;   ESOPHAGOGASTRODUODENOSCOPY (EGD) WITH PROPOFOL  N/A 11/12/2015   Procedure: ESOPHAGOGASTRODUODENOSCOPY (EGD) WITH PROPOFOL ;  Surgeon: Kitchens Rosalie, MD;  Location: [facility] ENDOSCOPY;  Service: Endoscopy;  Laterality: N/A;   ESOPHAGOGASTRODUODENOSCOPY (EGD) WITH PROPOFOL  N/A 08/07/2016   Procedure: ESOPHAGOGASTRODUODENOSCOPY (EGD) WITH  PROPOFOL ;  Surgeon: Kitchens Rosalie, MD;  Location: [facility] ENDOSCOPY;  Service: Endoscopy;  Laterality: N/A;   ESOPHAGOGASTRODUODENOSCOPY (EGD) WITH PROPOFOL  N/A 05/20/2017   Procedure: ESOPHAGOGASTRODUODENOSCOPY (EGD) WITH PROPOFOL ;  Surgeon: Rosalie Kitchens, MD;  Location: [facility] ENDOS[facility];  Service: Endoscopy;  Laterality: N/A;   ESOPHAGOGASTRODUODENOSCOPY (EGD) WITH PROPOFOL  N/A 12/02/2017   Procedure: ESOPHAGOGASTRODUODENOSCOPY (EGD) WITH PROPOFOL ;  Surgeon: Rosalie Kitchens, MD;  Location: [facility] ENDOSCOPY;  Service: Endoscopy;  Laterality: N/A;  have c arm available   ESOPHAGOGASTRODUODENOSCOPY (EGD) WITH PROPOFOL  N/A 01/23/2019   Procedure: ESOPHAGOGASTRODUODENOSCOPY (EGD) WITH PROPOFOL ;  Surgeon: Rosalie Kitchens, MD;  Location: [facility];  Service: Endoscopy;  Laterality: N/A;   ESOPHAGOGASTRODUODENOSCOPY (EGD) WITH PROPOFOL  N/A 07/13/2019   Procedure: ESOPHAGOGASTRODUODENOSCOPY (EGD) WITH PROPOFOL ;  Surgeon: Rosalie Kitchens, MD;  Location: [facility];  Service: Endoscopy;  Laterality: N/A;   ESOPHAGOGASTRODUODENOSCOPY (EGD) WITH PROPOFOL  N/A 02/02/2020   Procedure: ESOPHAGOGASTRODUODENOSCOPY (EGD) WITH PROPOFOL ;  Surgeon: Rosalie Kitchens, MD;  Location: [facility];  Service: Endoscopy;  Laterality: N/A;   ESOPHAGOGASTRODUODENOSCOPY (EGD) WITH PROPOFOL  N/A 07/16/2020   Procedure: ESOPHAGOGASTRODUODENOSCOPY (EGD) WITH PROPOFOL ;  Surgeon: Rosalie Kitchens, MD;  Location: [facility] END[facility]COPY;  Service: Endoscopy;  Laterality: N/A;   ESOPHAGOGASTRODUODENOSCOPY (EGD) WITH PROPOFOL  N/A 02/19/2021   Procedure:  ESOPHAGOGASTRODUODENOSCOPY (EGD) WITH PROPOFOL ;  Surgeon: Rosalie Kitchens, MD;  Location: [facility];  Service: Endoscopy;  Laterality: N/A;  with possible botox    ESOPHAGOGASTRODUODENOSCOPY (EGD) WITH PROPOFOL  N/A 09/30/2021   Procedure: ESOPHAGOGASTRODUODENOSCOPY (EGD) WITH PROPOFOL ;  Surgeon: Rosalie Kitchens, MD;  Location: [facility];  Service: Endoscopy;  Laterality: N/A;   ESOPHAGOGASTRODUODENOSCOPY (EGD) WITH PROPOFOL  N/A 03/19/2022    Procedure: ESOPHAGOGASTRODUODENOSCOPY (EGD) WITH PROPOFOL ;  Surgeon: Rosalie Kitchens, MD;  Location: [facility];  Service: Gastroenterology;  Laterality: N/A;   ESOPHAGOGASTRODUODENOSCOPY (EGD) WITH PROPOFOL  N/A 05/05/2023   Procedure: ESOPHAGOGASTRODUODENOSCOPY (EGD) WITH PROPOFOL ;  Surgeon: Rosalie Kitchens, MD;  Location: [facility] END[facility];  Service: Gastroenterology;  Laterality: N/A;  with balloon dilation   FOREIGN BODY REMOVAL  07/19/2018   Procedure: FOREIGN BODY REMOVAL;  Surgeon: Rosalie Kitchens, MD;  Location: [facility] ENDOSCOPY;  Service: Endoscopy;;   FRACTURE SURGERY     Hx: of left wrist 01/2021   GASTRECTOMY  [phone]   GASTRECTOMY N/A    X3   LITHOTRIPSY     Hx; of for kidney stones [id number]   SAVORY DILATION N/A 08/10/2014   Procedure: SAVORY DILATION;  Surgeon: Kitchens FORBES Rosalie, MD;  Location: [facility] ENDOS[facility];  Service: Endoscopy;  Laterality: N/A;   TOTAL HIP ARTHROPLASTY Right 12/30/2017   Procedure: RIGHT TOTAL HIP ARTHROPLASTY;  Surgeon: Margrette Taft FORBES, MD;  Location: [facility];  Service: Orthopedics;  Laterality: Right;   TOTAL HIP ARTHROPLASTY Left 02/17/2018   Procedure: TOTAL HIP ARTHROPLASTY;  Surgeon: Margrette Taft FORBES, MD;  Location: [facility];  Service: Orthopedics;  Laterality: Left;   TUBAL LIGATION     1975   Patient Active Problem List   Diagnosis Date Noted   Stricture of esophagus 08/21/2022   Closed nondisplaced oblique fracture of shaft of ulna with routine healing 03/20/2021   Closed fracture of lower end of right ulna 01/11/2021   Oral phase dysphagia 12/19/2020   Slow transit constipation 12/19/2020   Weight decreased 12/19/2020   Acquired hypertrophic pyloric stenosis 12/19/2020   Osteoporosis without pathological fracture 06/18/2020   Anemia, unspecified 06/27/2018   Age-related osteoporosis with current pathological fracture of right femur (HCC) 03/01/2018   Status post total hip replacement, left 02/17/18 02/17/2018   Closed displaced fracture of left femoral  neck (HCC) 02/14/2018   S/P hip replacement, right 12/30/17 01/13/2018   Closed displaced fracture of right femoral neck (HCC) 12/30/2017   BMI less than 19,adult 01/10/2016   GAD (generalized anxiety disorder) 01/10/2016   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   Hypothyroidism 04/24/2013   GERD (gastroesophageal reflux disease) 04/24/2013   Rosacea 04/24/2013   Iron  deficiency anemia due to chronic blood loss 06/09/2012   Gastric outlet obstruction 06/16/2011   Atrioventricular nodal re-entry tachycardia (HCC) 03/11/2010   MURMUR 03/11/2010   PERSONAL HISTORY OF URINARY CALCULI 02/14/2008   REFERRING PROVIDER: Georgina Ozell LABOR, MD   REFERRING DIAG: Cervicalgia   THERAPY DIAG:  Cervicalgia  Other muscle spasm  Pain in right arm  Rationale for Evaluation and Treatment: Rehabilitation  ONSET DATE: 6-7 months  SUBJECTIVE:  SUBJECTIVE STATEMENT:   Pain up to a 5 today.  PERTINENT HISTORY:  Osteoporosis, anxiety, arthritis,history of COVID-19, depression, and allergies  PAIN:  Are you having pain? Yes: NPRS scale: 5/10 Pain location: Bil neck   PRECAUTIONS: None  RED FLAGS: None    WEIGHT BEARING RESTRICTIONS: No  FALLS:  Has patient fallen in last 6 months? No  LIVING ENVIRONMENT: Lives with: lives alone Lives in: House/apartment  OCCUPATION: retired   PLOF: Independent  PATIENT GOALS: reduced pain and improved mobility  NEXT MD VISIT: only as needed  OBJECTIVE:  Note: Objective measures were completed at Evaluation unless otherwise noted.  DIAGNOSTIC FINDINGS: 03/22/24 cervical x-ray grade 1 spondylolisthesis at C5/6.  No evidence of  instability on flexion/extension views.  Disc height loss with anterior osteophyte formation at C5/6 and C6/7.  No  fracture or dislocation seen.   PATIENT SURVEYS:  NDI:  NECK DISABILITY INDEX  Date: 03/29/24 Score  Pain intensity 1 = The pain is very mild at the moment  2. Personal care (washing, dressing, etc.) 0 = I can look after myself normally without causing extra pain  3. Lifting 1 =  I can lift heavy weights but it gives extra pain  4. Reading 1 = I can read as much as I want to with slight pain in my neck  5. Headaches 0 = I have no headaches at all  6. Concentration 0 =  I can concentrate fully when I want to with no difficulty  7. Work 1 =  I can only do my usual work, but no more  8. Driving 0 = I can drive my car without any neck pain  9. Sleeping 0 = I have no trouble sleeping  10. Recreation 0 = I am able to engage in all my recreation activities with no neck pain at all  Total 4/50   Minimum Detectable Change (90% confidence): 5 points or 10% points  COGNITION: Overall cognitive status: Within functional limits for tasks assessed  SENSATION: Patient reports no numbness or tingling  POSTURE: rounded shoulders and forward head  PALPATION: TTP: bilateral scapular stabilizers, right upper trapezius, levator scapulae, and cervical paraspinals   CERVICAL ROM: significant difficulty isolating cervical rotation both directions  Active ROM A/PROM (deg) eval  Flexion 48  Extension 42; sore  Right lateral flexion 32; tension   Left lateral flexion 36  Right rotation 40  Left rotation 45   (Blank rows = not tested)  UPPER EXTREMITY ROM: WFL for activities assessed  UPPER EXTREMITY MMT:  MMT Right eval Left eval  Shoulder flexion 4/5 4/5  Shoulder extension    Shoulder abduction 4/5 4/5  Shoulder adduction    Shoulder extension    Shoulder internal rotation    Shoulder external rotation    Middle trapezius    Lower trapezius    Elbow flexion 4/5 4+/5  Elbow extension 3+/5 4/5  Wrist flexion    Wrist extension    Wrist ulnar deviation    Wrist radial deviation     Wrist pronation    Wrist supination    Grip strength 15 18   (Blank rows = not tested)  CERVICAL SPECIAL TESTS:  Spurling's test: Negative and Sharp pursor's test: Negative  TREATMENT DATE:    05/09/24:  Low-level combo e'stim/US  at 1.50 W/CM2 x 12 minutes (6 minutes to RT upper Trap and  RT shldr)  STW/M x 13  minutes To RT Utrap , cerv paras and RT deltoid.  HMP and IFC at 80-150 Hz on 40% scan x 20 minutes.  Normal modality response following removal of modality.       04/24/24:  Combo e'stim/US  at 1.50 W/CM2 x 10 minutes to cerv.paras.   STW/M x 14 minutes including ischemic release technique   HMP and IFC at 80-150 Hz on 40% scan x 18 minutes.  Normal modality response following removal of modality.                                                                                                                                                                                                             PATIENT EDUCATION:  Education details: plan for physical therapy, taking medications as prescribed Person educated: Patient Education method: Explanation Education comprehension: verbalized understanding  HOME EXERCISE PROGRAM:   Clinical impression statement:  Patient with increased pain today for no apparent reason.  She c/o pain  RT Utrap and into RT shldr today. Rx focused on decreasing pain upper trap, neck, and Rt shoulder with US  combo, STW, and IFC and did well. Pt reports decreased pain end of session.   OBJECTIVE IMPAIRMENTS: decreased mobility, decreased ROM, decreased strength, hypomobility, impaired tone, impaired UE functional use, postural dysfunction, and pain.   ACTIVITY LIMITATIONS: lifting and sleeping  PARTICIPATION LIMITATIONS: meal prep, cleaning, and laundry  PERSONAL FACTORS: Past/current experiences, Time since onset of injury/illness/exacerbation, and 3+ comorbidities: Osteoporosis, anxiety, arthritis,history of COVID-19, depression, and allergies are  also affecting patient's functional outcome.   REHAB POTENTIAL: Good  CLINICAL DECISION MAKING: Evolving/moderate complexity  EVALUATION COMPLEXITY: Moderate   GOALS: Goals reviewed with patient? Yes  LONG TERM GOALS: Target date: 04/26/24  Patient will be independent with her HEP. Baseline:  Goal status: Partially met.  2.  Patient will be able to demonstrate at least 50 degrees of cervical rotation bilaterally for improved awareness of her surroundings. Baseline: Bilateral 45 degrees. Goal status: Partially met.  3.  Patient will improve her right triceps strength to at least 4/5 for improved function reaching and picking up items at home. Baseline:  Goal status: On going  PLAN:  PT FREQUENCY: 1-2x/week  PT DURATION: 4 weeks  PLANNED INTERVENTIONS: 97164- PT Re-evaluation, 97750- Physical Performance Testing, 97110-Therapeutic exercises, 97530- Therapeutic activity, [id number]- Neuromuscular re-education, 97535- Self Care, 02859- Manual therapy, [id number]- Electrical stimulation (unattended), 02987- Traction (mechanical), 20560 (1-2 muscles), 20561 (3+ muscles)- Dry Needling, Patient/Family education, Joint mobilization, Spinal mobilization, Cryotherapy, and Moist heat  PLAN FOR NEXT SESSION: UBE, manual therapy, scapular retraction, upper extremity strengthening, and modalities as  needed  Progress Note Reporting Period 03/29/24 to 05/05/24  See note below for Objective Data and Assessment of Progress/Goals. Patient pleased with progress and is progressing toward goals.      Ferdinand Revoir,CHRIS, PTA 05/09/2024, 1:04 PM

## 2024-05-11 ENCOUNTER — Ambulatory Visit (HOSPITAL_COMMUNITY)
Admission: RE | Admit: 2024-05-11 | Discharge: 2024-05-11 | Disposition: A | Discharge status: Home / Self Care | Source: Ambulatory Visit | Attending: Urology | Admitting: Urology

## 2024-05-11 DIAGNOSIS — N200 Calculus of kidney: Secondary | ICD-10-CM | POA: Insufficient documentation

## 2024-05-11 DIAGNOSIS — N281 Cyst of kidney, acquired: Secondary | ICD-10-CM | POA: Diagnosis not present

## 2024-05-12 ENCOUNTER — Ambulatory Visit: Admitting: Physical Therapy

## 2024-05-12 DIAGNOSIS — M79601 Pain in right arm: Secondary | ICD-10-CM

## 2024-05-12 DIAGNOSIS — M542 Cervicalgia: Secondary | ICD-10-CM

## 2024-05-12 DIAGNOSIS — M62838 Other muscle spasm: Secondary | ICD-10-CM

## 2024-05-12 NOTE — Therapy (Signed)
 OUTPATIENT PHYSICAL THERAPY CERVICAL TREATMENT   Patient Name: Brittany Hensley MRN: [id number] DOB:[birth year], 73 y.o., female Today's Date: 05/12/2024  END OF SESSION:  PT End of Session - 05/12/24 1244     Visit Number 12    Number of Visits 14    Date for Recertification  06/02/24    PT Start Time 1015    PT Stop Time 1107    PT Time Calculation (min) 52 min    Activity Tolerance Patient tolerated treatment well    Behavior During Therapy WFL for tasks assessed/performed             Past Medical History:  Diagnosis Date   Anemia    iron     Anxiety    Arthritis    COVID    2020 bad cold s/s lasted 1 week. 04/28/2021   Depression    Diverticulitis    Hx: of   GERD (gastroesophageal reflux disease)    at times for 15 yrs 04/28/2021   History of kidney stones    10 years ago   Hypothyroidism    Low iron     Pneumonia    2020   Rosacea    Hx: of   SCC (squamous cell carcinoma) 08/13/2021   in situ-left temple (CX35FU)   SCC (squamous cell carcinoma) 08/13/2021   well diff-right forehead (CX35FU)   Squamous cell carcinoma of skin 08/03/2021   in situ- left malar cheek (CX35FU)   Past Surgical History:  Procedure Laterality Date   APPENDECTOMY     BALLOON DILATION  06/16/2011   Procedure: BALLOON DILATION;  Surgeon: Oliva FORBES Boots, MD;  Location: [facility] ENDOSCOPY;  Service: Endoscopy;  Laterality: N/A;   BALLOON DILATION N/A 10/03/2013   Procedure: BALLOON DILATION;  Surgeon: Oliva FORBES Boots, MD;  Location: [facility] ENDOSCOPY;  Service: Endoscopy;  Laterality: N/A;   BALLOON DILATION N/A 08/10/2014   Procedure: BALLOON DILATION;  Surgeon: Oliva FORBES Boots, MD;  Location: [facility] ENDOSCOPY;  Service: Endoscopy;  Laterality: N/A;  from 12 - 15 cm dilation completed   BALLOON DILATION N/A 02/15/2015   Procedure: BALLOON DILATION;  Surgeon: Oliva Boots, MD;  Location: [facility] ENDOSCOPY;  Service: Endoscopy;  Laterality: N/A;   BALLOON DILATION N/A 11/12/2015   Procedure: BALLOON DILATION;   Surgeon: Oliva Boots, MD;  Location: [facility] ENDOSCOPY;  Service: Endoscopy;  Laterality: N/A;   BALLOON DILATION N/A 08/10/2016   Procedure: BALLOON DILATION;  Surgeon: Oliva Boots, MD;  Location: [facility] ENDOSCOPY;  Service: Endoscopy;  Laterality: N/A;   BALLOON DILATION N/A 08/07/2016   Procedure: BALLOON DILATION;  Surgeon: Oliva Boots, MD;  Location: [facility] ENDOSCOPY;  Service: Endoscopy;  Laterality: N/A;   BALLOON DILATION N/A 05/20/2017   Procedure: BALLOON DILATION;  Surgeon: Boots Oliva, MD;  Location: [facility]OSCOPY;  Service: Endoscopy;  Laterality: N/A;   BALLOON DILATION N/A 12/02/2017   Procedure: BALLOON DILATION;  Surgeon: Boots Oliva, MD;  Location: [facility] ENDOSCOPY;  Service: Endoscopy;  Laterality: N/A;   BALLOON DILATION N/A 07/19/2018   Procedure: BALLOON DILATION;  Surgeon: Boots Oliva, MD;  Location: [facility]OSCOPY;  Service: Endoscopy;  Laterality: N/A;   BALLOON DILATION N/A 01/23/2019   Procedure: BALLOON DILATION;  Surgeon: Boots Oliva, MD;  Location: [facility]OSCOPY;  Service: Endoscopy;  Laterality: N/A;   BALLOON DILATION N/A 07/13/2019   Procedure: BALLOON DILATION;  Surgeon: Boots Oliva, MD;  Location: [facility]OS[facility];  Service: Endoscopy;  Laterality: N/A;   BALLOON DILATION N/A 02/02/2020   Procedure: BALLOON DILATION;  Surgeon: Rosalie Kitchens, MD;  Location: [facility]COPY;  Service: Endoscopy;  Laterality: N/A;   BALLOON DILATION N/A 07/16/2020   Procedure: BALLOON DILATION;  Surgeon: Rosalie Kitchens, MD;  Location: [facility]OSCOPY;  Service: Endoscopy;  Laterality: N/A;   BALLOON DILATION N/A 02/19/2021   Procedure: BALLOON DILATION;  Surgeon: Rosalie Kitchens, MD;  Location: [facility]OSCOPY;  Service: Endoscopy;  Laterality: N/A;   BALLOON DILATION N/A 09/30/2021   Procedure: BALLOON DILATION;  Surgeon: Rosalie Kitchens, MD;  Location: [facility]OSCOPY;  Service: Endoscopy;  Laterality: N/A;   BALLOON DILATION N/A 03/19/2022   Procedure: BALLOON DILATION;  Surgeon: Rosalie Kitchens, MD;  Location: [facility]OS[facility];   Service: Gastroenterology;  Laterality: N/A;   BALLOON DILATION N/A 05/05/2023   Procedure: BALLOON DILATION;  Surgeon: Rosalie Kitchens, MD;  Location: [facility]COPY;  Service: Gastroenterology;  Laterality: N/A;   CATARACT EXTRACTION, BILATERAL     2019   CHOLECYSTECTOMY OPEN  1979   COLONOSCOPY     Hx: of   CYSTOSCOPY/URETEROSCOPY/HOLMIUM LASER/STENT PLACEMENT Bilateral 05/06/2021   Procedure: CYSTOSCOPY BILATERAL RETROGRADE LEFT URETEROSCOPY/HOLMIUM LASER/STENT PLACEMENT;  Surgeon: Watt Rush, MD;  Location: [facility];  Service: Urology;  Laterality: Bilateral;   ESOPHAGOGASTRODUODENOSCOPY  06/16/2011   Procedure: ESOPHAGOGASTRODUODENOSCOPY (EGD);  Surgeon: Kitchens FORBES Rosalie, MD;  Location: [facility] ENDOSCOPY;  Service: Endoscopy;  Laterality: N/A;   ESOPHAGOGASTRODUODENOSCOPY N/A 10/03/2013   Procedure: ESOPHAGOGASTRODUODENOSCOPY (EGD);  Surgeon: Kitchens FORBES Rosalie, MD;  Location: [facility];  Service: Endoscopy;  Laterality: N/A;   ESOPHAGOGASTRODUODENOSCOPY N/A 08/10/2014   Procedure: ESOPHAGOGASTRODUODENOSCOPY (EGD);  Surgeon: Kitchens FORBES Rosalie, MD;  Location: [facility];  Service: Endoscopy;  Laterality: N/A;   ESOPHAGOGASTRODUODENOSCOPY N/A 08/10/2016   Procedure: ESOPHAGOGASTRODUODENOSCOPY (EGD);  Surgeon: Kitchens Rosalie, MD;  Location: [facility] ENDOSCOPY;  Service: Endoscopy;  Laterality: N/A;   ESOPHAGOGASTRODUODENOSCOPY N/A 07/19/2018   Procedure: ESOPHAGOGASTRODUODENOSCOPY (EGD);  Surgeon: Rosalie Kitchens, MD;  Location: [facility];  Service: Endoscopy;  Laterality: N/A;   ESOPHAGOGASTRODUODENOSCOPY (EGD) WITH PROPOFOL  N/A 02/15/2015   Procedure: ESOPHAGOGASTRODUODENOSCOPY (EGD) WITH PROPOFOL ;  Surgeon: Kitchens Rosalie, MD;  Location: [facility] ENDOSCOPY;  Service: Endoscopy;  Laterality: N/A;   ESOPHAGOGASTRODUODENOSCOPY (EGD) WITH PROPOFOL  N/A 11/12/2015   Procedure: ESOPHAGOGASTRODUODENOSCOPY (EGD) WITH PROPOFOL ;  Surgeon: Kitchens Rosalie, MD;  Location: [facility]OS[facility];  Service: Endoscopy;  Laterality: N/A;    ESOPHAGOGASTRODUODENOSCOPY (EGD) WITH PROPOFOL  N/A 08/07/2016   Procedure: ESOPHAGOGASTRODUODENOSCOPY (EGD) WITH PROPOFOL ;  Surgeon: Kitchens Rosalie, MD;  Location: [facility] ENDOSCOPY;  Service: Endoscopy;  Laterality: N/A;   ESOPHAGOGASTRODUODENOSCOPY (EGD) WITH PROPOFOL  N/A 05/20/2017   Procedure: ESOPHAGOGASTRODUODENOSCOPY (EGD) WITH PROPOFOL ;  Surgeon: Rosalie Kitchens, MD;  Location: [facility]OSCOPY;  Service: Endoscopy;  Laterality: N/A;   ESOPHAGOGASTRODUODENOSCOPY (EGD) WITH PROPOFOL  N/A 12/02/2017   Procedure: ESOPHAGOGASTRODUODENOSCOPY (EGD) WITH PROPOFOL ;  Surgeon: Rosalie Kitchens, MD;  Location: [facility] ENDOSCOPY;  Service: Endoscopy;  Laterality: N/A;  have c arm available   ESOPHAGOGASTRODUODENOSCOPY (EGD) WITH PROPOFOL  N/A 01/23/2019   Procedure: ESOPHAGOGASTRODUODENOSCOPY (EGD) WITH PROPOFOL ;  Surgeon: Rosalie Kitchens, MD;  Location: [facility]OSCOPY;  Service: Endoscopy;  Laterality: N/A;   ESOPHAGOGASTRODUODENOSCOPY (EGD) WITH PROPOFOL  N/A 07/13/2019   Procedure: ESOPHAGOGASTRODUODENOSCOPY (EGD) WITH PROPOFOL ;  Surgeon: Rosalie Kitchens, MD;  Location: [facility];  Service: Endoscopy;  Laterality: N/A;   ESOPHAGOGASTRODUODENOSCOPY (EGD) WITH PROPOFOL  N/A 02/02/2020   Procedure: ESOPHAGOGASTRODUODENOSCOPY (EGD) WITH PROPOFOL ;  Surgeon: Rosalie Kitchens, MD;  Location: [facility];  Service: Endoscopy;  Laterality: N/A;   ESOPHAGOGASTRODUODENOSCOPY (EGD) WITH PROPOFOL  N/A 07/16/2020   Procedure: ESOPHAGOGASTRODUODENOSCOPY (EGD) WITH PROPOFOL ;  Surgeon: Rosalie Kitchens, MD;  Location:  [facility] ENDOSCOPY;  Service: Endoscopy;  Laterality: N/A;   ESOPHAGOGASTRODUODENOSCOPY (EGD) WITH PROPOFOL  N/A 02/19/2021   Procedure: ESOPHAGOGASTRODUODENOSCOPY (EGD) WITH PROPOFOL ;  Surgeon: Rosalie Kitchens, MD;  Location: [facility] ENDOS[facility];  Service: Endoscopy;  Laterality: N/A;  with possible botox    ESOPHAGOGASTRODUODENOSCOPY (EGD) WITH PROPOFOL  N/A 09/30/2021   Procedure: ESOPHAGOGASTRODUODENOSCOPY (EGD) WITH PROPOFOL ;  Surgeon: Rosalie Kitchens, MD;  Location: [facility]  ENDOSCOPY;  Service: Endoscopy;  Laterality: N/A;   ESOPHAGOGASTRODUODENOSCOPY (EGD) WITH PROPOFOL  N/A 03/19/2022   Procedure: ESOPHAGOGASTRODUODENOSCOPY (EGD) WITH PROPOFOL ;  Surgeon: Rosalie Kitchens, MD;  Location: [facility] ENDOSCOPY;  Service: Gastroenterology;  Laterality: N/A;   ESOPHAGOGASTRODUODENOSCOPY (EGD) WITH PROPOFOL  N/A 05/05/2023   Procedure: ESOPHAGOGASTRODUODENOSCOPY (EGD) WITH PROPOFOL ;  Surgeon: Rosalie Kitchens, MD;  Location: [facility] ENDOSCOPY;  Service: Gastroenterology;  Laterality: N/A;  with balloon dilation   FOREIGN BODY REMOVAL  07/19/2018   Procedure: FOREIGN BODY REMOVAL;  Surgeon: Rosalie Kitchens, MD;  Location: [facility] ENDOSCOPY;  Service: Endoscopy;;   FRACTURE SURGERY     Hx: of left wrist 01/2021   GASTRECTOMY  [phone]   GASTRECTOMY N/A    X3   LITHOTRIPSY     Hx; of for kidney stones [id number]   SAVORY DILATION N/A 08/10/2014   Procedure: SAVORY DILATION;  Surgeon: Kitchens FORBES Rosalie, MD;  Location: [facility] ENDOSCOPY;  Service: Endoscopy;  Laterality: N/A;   TOTAL HIP ARTHROPLASTY Right 12/30/2017   Procedure: RIGHT TOTAL HIP ARTHROPLASTY;  Surgeon: Margrette Taft FORBES, MD;  Location: [facility];  Service: Orthopedics;  Laterality: Right;   TOTAL HIP ARTHROPLASTY Left 02/17/2018   Procedure: TOTAL HIP ARTHROPLASTY;  Surgeon: Margrette Taft FORBES, MD;  Location: [facility];  Service: Orthopedics;  Laterality: Left;   TUBAL LIGATION     1975   Patient Active Problem List   Diagnosis Date Noted   Stricture of esophagus 08/21/2022   Closed nondisplaced oblique fracture of shaft of ulna with routine healing 03/20/2021   Closed fracture of lower end of right ulna 01/11/2021   Oral phase dysphagia 12/19/2020   Slow transit constipation 12/19/2020   Weight decreased 12/19/2020   Acquired hypertrophic pyloric stenosis 12/19/2020   Osteoporosis without pathological fracture 06/18/2020   Anemia, unspecified 06/27/2018   Age-related osteoporosis with current pathological fracture of right femur (HCC)  03/01/2018   Status post total hip replacement, left 02/17/18 02/17/2018   Closed displaced fracture of left femoral neck (HCC) 02/14/2018   S/P hip replacement, right 12/30/17 01/13/2018   Closed displaced fracture of right femoral neck (HCC) 12/30/2017   BMI less than 19,adult 01/10/2016   GAD (generalized anxiety disorder) 01/10/2016   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   Hypothyroidism 04/24/2013   GERD (gastroesophageal reflux disease) 04/24/2013   Rosacea 04/24/2013   Iron  deficiency anemia due to chronic blood loss 06/09/2012   Gastric outlet obstruction 06/16/2011   Atrioventricular nodal re-entry tachycardia (HCC) 03/11/2010   MURMUR 03/11/2010   PERSONAL HISTORY OF URINARY CALCULI 02/14/2008   REFERRING PROVIDER: Georgina Ozell LABOR, MD   REFERRING DIAG: Cervicalgia   THERAPY DIAG:  Cervicalgia  Other muscle spasm  Pain in right arm  Rationale for Evaluation and Treatment: Rehabilitation  ONSET DATE: 6-7 months  SUBJECTIVE:  SUBJECTIVE STATEMENT: Last treatment helped.  PERTINENT HISTORY:  Osteoporosis, anxiety, arthritis,history of COVID-19, depression, and allergies  PAIN:  Are you having pain? Yes: NPRS scale: 3/10 Pain location: Bil neck   PRECAUTIONS: None  RED FLAGS: None    WEIGHT BEARING RESTRICTIONS: No  FALLS:  Has patient fallen in last 6 months? No  LIVING ENVIRONMENT: Lives with: lives alone Lives in: House/apartment  OCCUPATION: retired   PLOF: Independent  PATIENT GOALS: reduced pain and improved mobility  NEXT MD VISIT: only as needed  OBJECTIVE:  Note: Objective measures were completed at Evaluation unless otherwise noted.  DIAGNOSTIC FINDINGS: 03/22/24 cervical x-ray grade 1 spondylolisthesis at C5/6.  No evidence of   instability on flexion/extension views.  Disc height loss with anterior osteophyte formation at C5/6 and C6/7.  No fracture or dislocation seen.   PATIENT SURVEYS:  NDI:  NECK DISABILITY INDEX  Date: 03/29/24 Score  Pain intensity 1 = The pain is very mild at the moment  2. Personal care (washing, dressing, etc.) 0 = I can look after myself normally without causing extra pain  3. Lifting 1 =  I can lift heavy weights but it gives extra pain  4. Reading 1 = I can read as much as I want to with slight pain in my neck  5. Headaches 0 = I have no headaches at all  6. Concentration 0 =  I can concentrate fully when I want to with no difficulty  7. Work 1 =  I can only do my usual work, but no more  8. Driving 0 = I can drive my car without any neck pain  9. Sleeping 0 = I have no trouble sleeping  10. Recreation 0 = I am able to engage in all my recreation activities with no neck pain at all  Total 4/50   Minimum Detectable Change (90% confidence): 5 points or 10% points  COGNITION: Overall cognitive status: Within functional limits for tasks assessed  SENSATION: Patient reports no numbness or tingling  POSTURE: rounded shoulders and forward head  PALPATION: TTP: bilateral scapular stabilizers, right upper trapezius, levator scapulae, and cervical paraspinals   CERVICAL ROM: significant difficulty isolating cervical rotation both directions  Active ROM A/PROM (deg) eval  Flexion 48  Extension 42; sore  Right lateral flexion 32; tension   Left lateral flexion 36  Right rotation 40  Left rotation 45   (Blank rows = not tested)  UPPER EXTREMITY ROM: WFL for activities assessed  UPPER EXTREMITY MMT:  MMT Right eval Left eval  Shoulder flexion 4/5 4/5  Shoulder extension    Shoulder abduction 4/5 4/5  Shoulder adduction    Shoulder extension    Shoulder internal rotation    Shoulder external rotation    Middle trapezius    Lower trapezius    Elbow flexion 4/5 4+/5   Elbow extension 3+/5 4/5  Wrist flexion    Wrist extension    Wrist ulnar deviation    Wrist radial deviation    Wrist pronation    Wrist supination    Grip strength 15 18   (Blank rows = not tested)  CERVICAL SPECIAL TESTS:  Spurling's test: Negative and Sharp pursor's test: Negative  TREATMENT DATE:    05/12/24:  Combo e'stim/US  at 1.50 W/CM2 x 12 minutes to patient's right cervical and middle deltoid region f/b STW/M x 12 minutes f/b HMP and IFC at 80-150 Hz on 40% scan x 20 minutes. Normal modality resposne following removal of  modality.   05/09/24:  Low-level combo e'stim/US  at 1.50 W/CM2 x 12 minutes (6 minutes to RT upper Trap and  RT shldr)  STW/M x 13  minutes To RT Utrap , cerv paras and RT deltoid.   HMP and IFC at 80-150 Hz on 40% scan x 20 minutes.  Normal modality response following removal of modality.       04/24/24:  Combo e'stim/US  at 1.50 W/CM2 x 10 minutes to cerv.paras.   STW/M x 14 minutes including ischemic release technique   HMP and IFC at 80-150 Hz on 40% scan x 18 minutes.  Normal modality response following removal of modality.                                                                                                                                                                                                             PATIENT EDUCATION:  Education details: plan for physical therapy, taking medications as prescribed Person educated: Patient Education method: Explanation Education comprehension: verbalized understanding  HOME EXERCISE PROGRAM:   Clinical impression statement: Notable trigger point over right middle deltoid.  Pain reduced to a 2 after treatment.    OBJECTIVE IMPAIRMENTS: decreased mobility, decreased ROM, decreased strength, hypomobility, impaired tone, impaired UE functional use, postural dysfunction, and pain.   ACTIVITY LIMITATIONS: lifting and sleeping  PARTICIPATION LIMITATIONS: meal prep, cleaning, and  laundry  PERSONAL FACTORS: Past/current experiences, Time since onset of injury/illness/exacerbation, and 3+ comorbidities: Osteoporosis, anxiety, arthritis,history of COVID-19, depression, and allergies are also affecting patient's functional outcome.   REHAB POTENTIAL: Good  CLINICAL DECISION MAKING: Evolving/moderate complexity  EVALUATION COMPLEXITY: Moderate   GOALS: Goals reviewed with patient? Yes  LONG TERM GOALS: Target date: 04/26/24  Patient will be independent with her HEP. Baseline:  Goal status: Partially met.  2.  Patient will be able to demonstrate at least 50 degrees of cervical rotation bilaterally for improved awareness of her surroundings. Baseline: Bilateral 45 degrees. Goal status: Partially met.  3.  Patient will improve her right triceps strength to at least 4/5 for improved function reaching and picking up items at home. Baseline:  Goal status: On going  PLAN:  PT FREQUENCY: 1-2x/week  PT DURATION: 4 weeks  PLANNED INTERVENTIONS: 97164- PT Re-evaluation, 97750- Physical Performance Testing, 97110-Therapeutic exercises, 97530- Therapeutic activity, [id number]- Neuromuscular re-education, 97535- Self Care, 02859- Manual therapy, G0283- Electrical stimulation (unattended), 02987- Traction (mechanical), 20560 (1-2 muscles), 20561 (3+ muscles)- Dry Needling, Patient/Family education, Joint mobilization, Spinal mobilization, Cryotherapy, and Moist heat  PLAN FOR NEXT SESSION: UBE, manual therapy,  scapular retraction, upper extremity strengthening, and modalities as needed      Cadie Sorci, [place], PT 05/12/2024, 12:49 PM

## 2024-05-14 ENCOUNTER — Other Ambulatory Visit: Payer: Self-pay | Admitting: Nurse Practitioner

## 2024-05-14 DIAGNOSIS — D500 Iron deficiency anemia secondary to blood loss (chronic): Secondary | ICD-10-CM

## 2024-05-15 ENCOUNTER — Encounter: Payer: Self-pay | Admitting: Nurse Practitioner

## 2024-05-15 ENCOUNTER — Ambulatory Visit (INDEPENDENT_AMBULATORY_CARE_PROVIDER_SITE_OTHER): Admitting: Nurse Practitioner

## 2024-05-15 VITALS — BP 102/61 | HR 68 | Temp 97.7°F | Ht 65.0 in | Wt 105.0 lb

## 2024-05-15 DIAGNOSIS — D500 Iron deficiency anemia secondary to blood loss (chronic): Secondary | ICD-10-CM

## 2024-05-15 DIAGNOSIS — K219 Gastro-esophageal reflux disease without esophagitis: Secondary | ICD-10-CM

## 2024-05-15 DIAGNOSIS — F411 Generalized anxiety disorder: Secondary | ICD-10-CM | POA: Diagnosis not present

## 2024-05-15 DIAGNOSIS — E559 Vitamin D deficiency, unspecified: Secondary | ICD-10-CM | POA: Diagnosis not present

## 2024-05-15 MED ORDER — ALBUTEROL SULFATE HFA 108 (90 BASE) MCG/ACT IN AERS: 2.0000 | INHALATION_SPRAY | Freq: Four times a day (QID) | RESPIRATORY_TRACT | 2 refills | Status: AC | PRN

## 2024-05-15 MED ORDER — OMEPRAZOLE 40 MG PO CPDR: 40.0000 mg | DELAYED_RELEASE_CAPSULE | Freq: Every day | ORAL | 1 refills | Status: AC

## 2024-05-15 MED ORDER — FOLIC ACID 800 MCG PO TABS: 800.0000 ug | ORAL_TABLET | Freq: Every day | ORAL | 1 refills | Status: AC

## 2024-05-15 MED ORDER — SUCRALFATE 1 G PO TABS: 1.0000 g | ORAL_TABLET | Freq: Four times a day (QID) | ORAL | 5 refills | Status: AC

## 2024-05-15 MED ORDER — ALPRAZOLAM 0.5 MG PO TABS: 0.5000 mg | ORAL_TABLET | Freq: Four times a day (QID) | ORAL | 5 refills | Status: AC | PRN

## 2024-05-15 MED ORDER — SERTRALINE HCL 100 MG PO TABS: ORAL_TABLET | ORAL | 1 refills | Status: AC

## 2024-05-15 NOTE — Patient Instructions (Signed)
 Fall Prevention in the Home, Adult Falls can cause injuries and can happen to people of all ages. There are many things you can do to make your home safer and to help prevent falls. What actions can I take to prevent falls? General information Use good lighting in all rooms. Make sure to: Replace any light bulbs that burn out. Turn on the lights in dark areas and use night-lights. Keep items that you use often in easy-to-reach places. Lower the shelves around your home if needed. Move furniture so that there are clear paths around it. Do not use throw rugs or other things on the floor that can make you trip. If any of your floors are uneven, fix them. Add color or contrast paint or tape to clearly mark and help you see: Grab bars or handrails. First and last steps of staircases. Where the edge of each step is. If you use a ladder or stepladder: Make sure that it is fully opened. Do not climb a closed ladder. Make sure the sides of the ladder are locked in place. Have someone hold the ladder while you use it. Know where your pets are as you move through your home. What can I do in the bathroom?     Keep the floor dry. Clean up any water on the floor right away. Remove soap buildup in the bathtub or shower. Buildup makes bathtubs and showers slippery. Use non-skid mats or decals on the floor of the bathtub or shower. Attach bath mats securely with double-sided, non-slip rug tape. If you need to sit down in the shower, use a non-slip stool. Install grab bars by the toilet and in the bathtub and shower. Do not use towel bars as grab bars. What can I do in the bedroom? Make sure that you have a light by your bed that is easy to reach. Do not use any sheets or blankets on your bed that hang to the floor. Have a firm chair or bench with side arms that you can use for support when you get dressed. What can I do in the kitchen? Clean up any spills right away. If you need to reach something  above you, use a step stool with a grab bar. Keep electrical cords out of the way. Do not use floor polish or wax that makes floors slippery. What can I do with my stairs? Do not leave anything on the stairs. Make sure that you have a light switch at the top and the bottom of the stairs. Make sure that there are handrails on both sides of the stairs. Fix handrails that are broken or loose. Install non-slip stair treads on all your stairs if they do not have carpet. Avoid having throw rugs at the top or bottom of the stairs. Choose a carpet that does not hide the edge of the steps on the stairs. Make sure that the carpet is firmly attached to the stairs. Fix carpet that is loose or worn. What can I do on the outside of my home? Use bright outdoor lighting. Fix the edges of walkways and driveways and fix any cracks. Clear paths of anything that can make you trip, such as tools or rocks. Add color or contrast paint or tape to clearly mark and help you see anything that might make you trip as you walk through a door, such as a raised step or threshold. Trim any bushes or trees on paths to your home. Check to see if handrails are loose  or broken and that both sides of all steps have handrails. Install guardrails along the edges of any raised decks and porches. Have leaves, snow, or ice cleared regularly. Use sand, salt, or ice melter on paths if you live where there is ice and snow during the winter. Clean up any spills in your garage right away. This includes grease or oil spills. What other actions can I take? Review your medicines with your doctor. Some medicines can cause dizziness or changes in blood pressure, which increase your risk of falling. Wear shoes that: Have a low heel. Do not wear high heels. Have rubber bottoms and are closed at the toe. Feel good on your feet and fit well. Use tools that help you move around if needed. These include: Canes. Walkers. Scooters. Crutches. Ask  your doctor what else you can do to help prevent falls. This may include seeing a physical therapist to learn to do exercises to move better and get stronger. Where to find more information Centers for Disease Control and Prevention, STEAD[employer]: TonerPromos.no [employer] on Aging: BaseRingTones.pl National Institute on Aging: BaseRingTones.pl Contact a doctor if: You are afraid of falling at home. You feel weak, drowsy, or dizzy at home. You fall at home. Get help right away if you: Lose consciousness or have trouble moving after a fall. Have a fall that causes a head injury. These symptoms may be an emergency. Get help right away. Call 911. Do not wait to see if the symptoms will go away. Do not drive yourself to the hospital. This information is not intended to replace advice given to you by your health care provider. Make sure you discuss any questions you have with your health care provider. Document Revised: 03/23/2022 Document Reviewed: 03/23/2022 Elsevier Patient Education  2024 [employer].

## 2024-05-15 NOTE — Addendum Note (Signed)
 Addended by: VIKTORIA ALAN MATSU on: 05/15/2024 03:21 PM   Modules accepted: Orders

## 2024-05-15 NOTE — Progress Notes (Signed)
 Subjective:    Patient ID: Brittany Hensley, female    DOB: 31-Jan-1951, 73 y.o.   MRN: [id number]   Chief Complaint: medical management of chronic issues    HPI:  Brittany Hensley is a 73 y.o. who identifies as a female who was assigned female at birth.   Social history: Lives with: her daughter  lived with her Work history: disability   Comes in today for follow up of the following chronic medical issues:  1. Gastroesophageal reflux disease without esophagitis Is on omeprazole  daily. Helps with reflux symptoms.  2. Gastric outlet obstruction Has poor appetite because her stomach does not empty properly. She had balloon stretching to gastro anastomosis.  3. Hypothyroidism due to acquired atrophy of thyroid  No issues that she is aware of Lab Results  Component Value Date   TSH 2.840 11/05/2023     4. Iron  deficiency anemia due to chronic blood loss Lab Results  Component Value Date   HGB 13.1 03/22/2024     5. Rosacea Has been under good control as of late  6. GAD (generalized anxiety disorder) Is on xanax  QID     05/15/2024    2:49 PM 11/05/2023   10:15 AM 05/10/2023   11:31 AM 12/10/2022   10:42 AM  GAD 7 : Generalized Anxiety Score  Nervous, Anxious, on Edge 0 0 0 0  Control/stop worrying 0 0 0 0  Worry too much - different things 0 0 0 0  Trouble relaxing 0 0 0 0  Restless 0 0 0 0  Easily annoyed or irritable 0 0 0 0  Afraid - awful might happen 0 0 0 0  Total GAD 7 Score 0 0 0 0  Anxiety Difficulty Not difficult at all Not difficult at all Not difficult at all Not difficult at all         7. Osteoporosis without pathological fracture Last dexascan was done at 05/28/22.  Her t score was -5.3  8. BMI less than 19,adult No recent weight changes Wt Readings from Last 3 Encounters:  05/15/24 105 lb (47.6 kg)  03/22/24 105 lb (47.6 kg)  02/17/24 105 lb (47.6 kg)   BMI Readings from Last 3 Encounters:  05/15/24 17.47 kg/m  03/22/24 17.47 kg/m   02/17/24 17.47 kg/m          New complaints: None  Allergies  Allergen Reactions   Augmentin  [Amoxicillin -Pot Clavulanate] Other (See Comments)    Bloody stool Did it involve swelling of the face/tongue/throat, SOB, or low BP? No Did it involve sudden or severe rash/hives, skin peeling, or any reaction on the inside of your mouth or nose? No Did you need to seek medical attention at a hospital or doctor's office? No When did it last happen?      1 year If all above answers are "NO", may proceed with cephalosporin use.    Famotidine  Other (See Comments)    Fever    Klonopin [Clonazepam] Other (See Comments)    double vision   Other Other (See Comments)   Nitrofurantoin Rash   Reglan [Metoclopramide] Anxiety and Other (See Comments)    Causes confusion   Sulfamethoxazole-Trimethoprim Rash   Outpatient Encounter Medications as of 05/15/2024  Medication Sig   acetaminophen  (TYLENOL ) 500 MG tablet Take 500 mg by mouth 2 (two) times daily.   albuterol  (VENTOLIN  HFA) 108 (90 Base) MCG/ACT inhaler USE 2 PUFFS EVERY 6 HOURS AS NEEDED   ALPRAZolam  (XANAX ) 0.5 MG tablet Take 1  tablet (0.5 mg total) by mouth 4 (four) times daily as needed for anxiety.   Ascorbic Acid  (VITAMIN C ) 1000 MG tablet Take 1,000 mg by mouth daily.   Biotin 10 MG CAPS Take 10 mg by mouth daily.   BLACK ELDERBERRY PO Take 50 mg by mouth daily.   calcium  citrate (CALCITRATE - DOSED IN MG ELEMENTAL CALCIUM ) 950 (200 Ca) MG tablet Take 2 tablets by mouth 2 (two) times daily.   Cholecalciferol  (VITAMIN D ) 2000 units tablet Take 2,000 Units by mouth 2 (two) times daily.   docusate sodium  (COLACE) 100 MG capsule Take 1 capsule (100 mg total) by mouth 2 (two) times daily.   folic acid  (FOLVITE ) 800 MCG tablet Take 1 tablet (800 mcg total) by mouth daily.   levothyroxine  (SYNTHROID ) 88 MCG tablet TAKE ONE (1) TABLET BY MOUTH EVERY DAY   loratadine  (CLARITIN ) 10 MG tablet TAKE ONE (1) TABLET EACH DAY   MAGNESIUM   CITRATE PO Take 500 mg by mouth 2 (two) times daily.   meclizine (ANTIVERT) 12.5 MG tablet Take 12.5 mg by mouth 3 (three) times daily as needed for dizziness. (Patient not taking: Reported on 11/05/2023)   Multiple Vitamins-Minerals (CENTRUM SILVER) CHEW Chew 1 tablet by mouth 2 (two) times daily.   omeprazole  (PRILOSEC) 40 MG capsule Take 1 capsule (40 mg total) by mouth daily.   ondansetron  (ZOFRAN -ODT) 4 MG disintegrating tablet TAKE 1 TABLET EVERY 8 HOURS AS NEEDED FOR NAUSEA AND VOMITING   Polyethyl Glycol-Propyl Glycol (SYSTANE OP) Place 1 drop into both eyes daily as needed (dry eyes).   pregabalin  (LYRICA ) 50 MG capsule TAKE ONE (1) CAPSULE BY MOUTH 2 TIMES DAILY   Probiotic Product (PROBIOTIC PO) Take 1 capsule by mouth daily.    sertraline  (ZOLOFT ) 100 MG tablet TAKE TWO (2) TABLETS BY MOUTH DAILY   sucralfate  (CARAFATE ) 1 g tablet TAKE 1 TABLET 4 TIMES DAILY WITH MEALS AND AT BEDTIME   Turmeric Curcumin 500 MG CAPS Take 500 mg by mouth daily.   No facility-administered encounter medications on file as of 05/15/2024.    Past Surgical History:  Procedure Laterality Date   APPENDECTOMY     BALLOON DILATION  06/16/2011   Procedure: BALLOON DILATION;  Surgeon: Oliva FORBES Boots, MD;  Location: [facility] ENDOSCOPY;  Service: Endoscopy;  Laterality: N/A;   BALLOON DILATION N/A 10/03/2013   Procedure: BALLOON DILATION;  Surgeon: Oliva FORBES Boots, MD;  Location: [facility] ENDOSCOPY;  Service: Endoscopy;  Laterality: N/A;   BALLOON DILATION N/A 08/10/2014   Procedure: BALLOON DILATION;  Surgeon: Oliva FORBES Boots, MD;  Location: [facility] ENDOSCOPY;  Service: Endoscopy;  Laterality: N/A;  from 12 - 15 cm dilation completed   BALLOON DILATION N/A 02/15/2015   Procedure: BALLOON DILATION;  Surgeon: Oliva Boots, MD;  Location: [facility] ENDOSCOPY;  Service: Endoscopy;  Laterality: N/A;   BALLOON DILATION N/A 11/12/2015   Procedure: BALLOON DILATION;  Surgeon: Oliva Boots, MD;  Location: [facility] ENDOSCOPY;  Service: Endoscopy;  Laterality:  N/A;   BALLOON DILATION N/A 08/10/2016   Procedure: BALLOON DILATION;  Surgeon: Oliva Boots, MD;  Location[facility] ENDOSCOPY;  Service: Endoscopy;  Laterality: N/A;   BALLOON DILATION N/A 08/07/2016   Procedure: BALLOON DILATION;  Surgeon: Oliva Boots, MD;  Location: [facility] ENDOSCOPY;  Service: Endoscopy;  Laterality: N/A;   BALLOON DILATION N/A 05/20/2017   Procedure: BALLOON DILATION;  Surgeon: Boots Oliva, MD;  Location: [facility] ENDOSCOPY;  Service: Endoscopy;  Laterality: N/A;   BALLOON DILATION N/A 12/02/2017   Procedure: BALLOON  DILATION;  Surgeon: Rosalie Kitchens, MD;  Location: [facility] ENDOSCOPY;  Service: Endoscopy;  Laterality: N/A;   BALLOON DILATION N/A 07/19/2018   Procedure: BALLOON DILATION;  Surgeon: Rosalie Kitchens, MD;  Location: [facility] ENDOSCOPY;  Service: Endoscopy;  Laterality: N/A;   BALLOON DILATION N/A 01/23/2019   Procedure: BALLOON DILATION;  Surgeon: Rosalie Kitchens, MD;  Location: [facility] ENDOSCOPY;  Service: Endoscopy;  Laterality: N/A;   BALLOON DILATION N/A 07/13/2019   Procedure: BALLOON DILATION;  Surgeon: Rosalie Kitchens, MD;  Location: [facility] ENDOSCOPY;  Service: Endoscopy;  Laterality: N/A;   BALLOON DILATION N/A 02/02/2020   Procedure: BALLOON DILATION;  Surgeon: Rosalie Kitchens, MD;  Location: [facility] ENDOSCOPY;  Service: Endoscopy;  Laterality: N/A;   BALLOON DILATION N/A 07/16/2020   Procedure: BALLOON DILATION;  Surgeon: Rosalie Kitchens, MD;  Location: [facility] ENDOSCOPY;  Service: Endoscopy;  Laterality: N/A;   BALLOON DILATION N/A 02/19/2021   Procedure: BALLOON DILATION;  Surgeon: Rosalie Kitchens, MD;  Location: [facility] ENDOSCOPY;  Service: Endoscopy;  Laterality: N/A;   BALLOON DILATION N/A 09/30/2021   Procedure: BALLOON DILATION;  Surgeon: Rosalie Kitchens, MD;  Location: [facility] ENDOSCOPY;  Service: Endoscopy;  Laterality: N/A;   BALLOON DILATION N/A 03/19/2022   Procedure: BALLOON DILATION;  Surgeon: Rosalie Kitchens, MD;  Location: [facility] ENDOSCOPY;  Service: Gastroenterology;  Laterality: N/A;   BALLOON DILATION N/A 05/05/2023    Procedure: BALLOON DILATION;  Surgeon: Rosalie Kitchens, MD;  Location: [facility] ENDOSCOPY;  Service: Gastroenterology;  Laterality: N/A;   CATARACT EXTRACTION, BILATERAL     2019   CHOLECYSTECTOMY OPEN  1979   COLONOSCOPY     Hx: of   CYSTOSCOPY/URETEROSCOPY/HOLMIUM LASER/STENT PLACEMENT Bilateral 05/06/2021   Procedure: CYSTOSCOPY BILATERAL RETROGRADE LEFT URETEROSCOPY/HOLMIUM LASER/STENT PLACEMENT;  Surgeon: Watt Rush, MD;  Location: [facility];  Service: Urology;  Laterality: Bilateral;   ESOPHAGOGASTRODUODENOSCOPY  06/16/2011   Procedure: ESOPHAGOGASTRODUODENOSCOPY (EGD);  Surgeon: Kitchens FORBES Rosalie, MD;  Location: [facility] ENDOSCOPY;  Service: Endoscopy;  Laterality: N/A;   ESOPHAGOGASTRODUODENOSCOPY N/A 10/03/2013   Procedure: ESOPHAGOGASTRODUODENOSCOPY (EGD);  Surgeon: Kitchens FORBES Rosalie, MD;  Location: [facility] ENDOSCOPY;  Service: Endoscopy;  Laterality: N/A;   ESOPHAGOGASTRODUODENOSCOPY N/A 08/10/2014   Procedure: ESOPHAGOGASTRODUODENOSCOPY (EGD);  Surgeon: Kitchens FORBES Rosalie, MD;  Location: [facility] ENDOSCOPY;  Service: Endoscopy;  Laterality: N/A;   ESOPHAGOGASTRODUODENOSCOPY N/A 08/10/2016   Procedure: ESOPHAGOGASTRODUODENOSCOPY (EGD);  Surgeon: Kitchens Rosalie, MD;  Location: [facility] ENDOSCOPY;  Service: Endoscopy;  Laterality: N/A;   ESOPHAGOGASTRODUODENOSCOPY N/A 07/19/2018   Procedure: ESOPHAGOGASTRODUODENOSCOPY (EGD);  Surgeon: Rosalie Kitchens, MD;  Location: [facility]OSCOPY;  Service: Endoscopy;  Laterality: N/A;   ESOPHAGOGASTRODUODENOSCOPY (EGD) WITH PROPOFOL  N/A 02/15/2015   Procedure: ESOPHAGOGASTRODUODENOSCOPY (EGD) WITH PROPOFOL ;  Surgeon: Kitchens Rosalie, MD;  Location: [facility] ENDOSCOPY;  Service: Endoscopy;  Laterality: N/A;   ESOPHAGOGASTRODUODENOSCOPY (EGD) WITH PROPOFOL  N/A 11/12/2015   Procedure: ESOPHAGOGASTRODUODENOSCOPY (EGD) WITH PROPOFOL ;  Surgeon: Kitchens Rosalie, MD;  Location: [facility] ENDOSCOPY;  Service: Endoscopy;  Laterality: N/A;   ESOPHAGOGASTRODUODENOSCOPY (EGD) WITH PROPOFOL  N/A 08/07/2016   Procedure:  ESOPHAGOGASTRODUODENOSCOPY (EGD) WITH PROPOFOL ;  Surgeon: Kitchens Rosalie, MD;  Location[facility] ENDOSCOPY;  Service: Endoscopy;  Laterality: N/A;   ESOPHAGOGASTRODUODENOSCOPY (EGD) WITH PROPOFOL  N/A 05/20/2017   Procedure: ESOPHAGOGASTRODUODENOSCOPY (EGD) WITH PROPOFOL ;  Surgeon: Rosalie Kitchens, MD;  Location: [facility] ENDOSCOPY;  Service: Endoscopy;  Laterality: N/A;   ESOPHAGOGASTRODUODENOSCOPY (EGD) WITH PROPOFOL  N/A 12/02/2017   Procedure: ESOPHAGOGASTRODUODENOSCOPY (EGD) WITH PROPOFOL ;  Surgeon: Rosalie Kitchens, MD;  Location: [facility] ENDOSCOPY;  Service: Endoscopy;  Laterality: N/A;  have c arm available   ESOPHAGOGASTRODUODENOSCOPY (EGD) WITH PROPOFOL   N/A 01/23/2019   Procedure: ESOPHAGOGASTRODUODENOSCOPY (EGD) WITH PROPOFOL ;  Surgeon: Rosalie Kitchens, MD;  Location: [facility] ENDOSCOPY;  Service: Endoscopy;  Laterality: N/A;   ESOPHAGOGASTRODUODENOSCOPY (EGD) WITH PROPOFOL  N/A 07/13/2019   Procedure: ESOPHAGOGASTRODUODENOSCOPY (EGD) WITH PROPOFOL ;  Surgeon: Rosalie Kitchens, MD;  Location: [facility] ENDOSCOPY;  Service: Endoscopy;  Laterality: N/A;   ESOPHAGOGASTRODUODENOSCOPY (EGD) WITH PROPOFOL  N/A 02/02/2020   Procedure: ESOPHAGOGASTRODUODENOSCOPY (EGD) WITH PROPOFOL ;  Surgeon: Rosalie Kitchens, MD;  Location: [facility] END[facility];  Service: Endoscopy;  Laterality: N/A;   ESOPHAGOGASTRODUODENOSCOPY (EGD) WITH PROPOFOL  N/A 07/16/2020   Procedure: ESOPHAGOGASTRODUODENOSCOPY (EGD) WITH PROPOFOL ;  Surgeon: Rosalie Kitchens, MD;  Location: [facility] ENDOSCOPY;  Service: Endoscopy;  Laterality: N/A;   ESOPHAGOGASTRODUODENOSCOPY (EGD) WITH PROPOFOL  N/A 02/19/2021   Procedure: ESOPHAGOGASTRODUODENOSCOPY (EGD) WITH PROPOFOL ;  Surgeon: Rosalie Kitchens, MD;  Location: [facility] ENDOS[facility];  Service: Endoscopy;  Laterality: N/A;  with possible botox    ESOPHAGOGASTRODUODENOSCOPY (EGD) WITH PROPOFOL  N/A 09/30/2021   Procedure: ESOPHAGOGASTRODUODENOSCOPY (EGD) WITH PROPOFOL ;  Surgeon: Rosalie Kitchens, MD;  Location: [facility] ENDOSCOPY;  Service: Endoscopy;  Laterality: N/A;    ESOPHAGOGASTRODUODENOSCOPY (EGD) WITH PROPOFOL  N/A 03/19/2022   Procedure: ESOPHAGOGASTRODUODENOSCOPY (EGD) WITH PROPOFOL ;  Surgeon: Rosalie Kitchens, MD;  Location: [facility] ENDOSCOPY;  Service: Gastroenterology;  Laterality: N/A;   ESOPHAGOGASTRODUODENOSCOPY (EGD) WITH PROPOFOL  N/A 05/05/2023   Procedure: ESOPHAGOGASTRODUODENOSCOPY (EGD) WITH PROPOFOL ;  Surgeon: Rosalie Kitchens, MD;  Location: [facility] ENDOSCOPY;  Service: Gastroenterology;  Laterality: N/A;  with balloon dilation   FOREIGN BODY REMOVAL  07/19/2018   Procedure: FOREIGN BODY REMOVAL;  Surgeon: Rosalie Kitchens, MD;  Location: [facility] ENDOSCOPY;  Service: Endoscopy;;   FRACTURE SURGERY     Hx: of left wrist 01/2021   GASTRECTOMY  [phone]   GASTRECTOMY N/A    X3   LITHOTRIPSY     Hx; of for kidney stones [id number]   SAVORY DILATION N/A 08/10/2014   Procedure: SAVORY DILATION;  Surgeon: Kitchens FORBES Rosalie, MD;  Location: [facility] ENDOSCOPY;  Service: Endoscopy;  Laterality: N/A;   TOTAL HIP ARTHROPLASTY Right 12/30/2017   Procedure: RIGHT TOTAL HIP ARTHROPLASTY;  Surgeon: Margrette Taft FORBES, MD;  Location: [facility];  Service: Orthopedics;  Laterality: Right;   TOTAL HIP ARTHROPLASTY Left 02/17/2018   Procedure: TOTAL HIP ARTHROPLASTY;  Surgeon: Margrette Taft FORBES, MD;  Location: [facility];  Service: Orthopedics;  Laterality: Left;   TUBAL LIGATION     1975    Family History  Problem Relation Age of Onset   Diabetes Mother    Cancer - Prostate Brother       Controlled substance contract: 05/29/22     Review of Systems  Constitutional:  Negative for diaphoresis.  Eyes:  Negative for pain.  Respiratory:  Negative for shortness of breath.   Cardiovascular:  Negative for chest pain, palpitations and leg swelling.  Gastrointestinal:  Negative for abdominal pain.  Endocrine: Negative for polydipsia.  Skin:  Negative for rash.  Neurological:  Negative for dizziness, weakness and headaches.  Hematological:  Does not bruise/bleed easily.  All other systems  reviewed and are negative.      Objective:   Physical Exam Vitals and nursing note reviewed.  Constitutional:      General: She is not in acute distress.    Appearance: Normal appearance. She is well-developed.  HENT:     Head: Normocephalic.     Right Ear: Tympanic membrane normal.     Left Ear: Tympanic membrane normal.     Nose: Nose normal.     Mouth/Throat:     Mouth: Mucous membranes are  moist.  Eyes:     Pupils: Pupils are equal, round, and reactive to light.  Neck:     Vascular: No carotid bruit or JVD.  Cardiovascular:     Rate and Rhythm: Normal rate and regular rhythm.     Heart sounds: Normal heart sounds.  Pulmonary:     Effort: Pulmonary effort is normal. No respiratory distress.     Breath sounds: Normal breath sounds. No wheezing or rales.  Chest:     Chest wall: No tenderness.  Abdominal:     General: Bowel sounds are normal. There is no distension or abdominal bruit.     Palpations: Abdomen is soft. There is no hepatomegaly, splenomegaly, mass or pulsatile mass.     Tenderness: There is no abdominal tenderness.  Musculoskeletal:        General: Normal range of motion.     Cervical back: Normal range of motion and neck supple.  Lymphadenopathy:     Cervical: No cervical adenopathy.  Skin:    General: Skin is warm and dry.  Neurological:     Mental Status: She is alert and oriented to person, place, and time.     Deep Tendon Reflexes: Reflexes are normal and symmetric.  Psychiatric:        Behavior: Behavior normal.        Thought Content: Thought content normal.        Judgment: Judgment normal.    BP 102/61   Pulse 68   Temp 97.7 F (36.5 C) (Temporal)   Ht 5' 5 (1.651 m)   Wt 105 lb (47.6 kg)   SpO2 97%   BMI 17.47 kg/m        Assessment & Plan:   Brittany Hensley comes in today with chief complaint of medical management of chronic issues    Diagnosis and orders addressed:  1. Gastroesophageal reflux disease without  esophagitis Avoid spicy foods Do not eat 2 hours prior to bedtime - omeprazole  (PRILOSEC) 40 MG capsule; Take 1 capsule (40 mg total) by mouth daily.  Dispense: 90 capsule; Refill: 1  2. Gastric outlet obstruction Keep follow  up with Dr. Rosalie tomorrow  3. Hypothyroidism due to acquired atrophy of thyroid  Labs pending - levothyroxine  (SYNTHROID ) 100 MCG tablet; Take 1 tablet (100 mcg total) by mouth daily.  Dispense: 90 tablet; Refill: 3  4. Iron  deficiency anemia due to chronic blood loss - folic acid  (FOLVITE ) 800 MCG tablet; Take 1 tablet (800 mcg total) by mouth daily.  Dispense: 90 tablet; Refill: 1 - sucralfate  (CARAFATE ) 1 g tablet; Take 1 tablet (1 g total) by mouth 4 (four) times daily -  with meals and at bedtime.  Dispense: 120 tablet; Refill: 1 - Ferritin  5. Rosacea  6. GAD (generalized anxiety disorder) Stress management - ALPRAZolam  (XANAX ) 0.5 MG tablet; Take 1 tablet (0.5 mg total) by mouth 4 (four) times daily as needed for anxiety.  Dispense: 120 tablet; Refill: 5 - sertraline  (ZOLOFT ) 100 MG tablet; TAKE TWO (2) TABLETS BY MOUTH DAILY  Dispense: 180 tablet; Refill: 1  7. Osteoporosis without pathological fracture Weight a exercises  8. BMI less than 19,adult Discussed diet and exercise for person with BMI >25 Will recheck weight in 3-6 months   Labs pending Health Maintenance reviewed Diet and exercise encouraged  Follow up plan: 6 months   Mary-Margaret Gladis, FNP

## 2024-05-15 NOTE — Addendum Note (Signed)
 Addended by: GLADIS MUSTARD on: 05/15/2024 03:10 PM   Modules accepted: Orders

## 2024-05-16 ENCOUNTER — Ambulatory Visit: Payer: Self-pay | Admitting: Nurse Practitioner

## 2024-05-16 DIAGNOSIS — N1831 Chronic kidney disease, stage 3a: Secondary | ICD-10-CM | POA: Insufficient documentation

## 2024-05-16 LAB — CBC WITH DIFFERENTIAL/PLATELET
Basophils Absolute: 0 x10E3/uL (ref 0.0–0.2)
Basos: 1 %
EOS (ABSOLUTE): 0.1 x10E3/uL (ref 0.0–0.4)
Eos: 3 %
Hematocrit: 38.9 % (ref 34.0–46.6)
Hemoglobin: 12.9 g/dL (ref 11.1–15.9)
Immature Grans (Abs): 0 x10E3/uL (ref 0.0–0.1)
Immature Granulocytes: 0 %
Lymphocytes Absolute: 2.3 x10E3/uL (ref 0.7–3.1)
Lymphs: 41 %
MCH: 32.4 pg (ref 26.6–33.0)
MCHC: 33.2 g/dL (ref 31.5–35.7)
MCV: 98 fL — ABNORMAL HIGH (ref 79–97)
Monocytes Absolute: 0.7 x10E3/uL (ref 0.1–0.9)
Monocytes: 13 %
Neutrophils Absolute: 2.3 x10E3/uL (ref 1.4–7.0)
Neutrophils: 42 %
Platelets: 233 x10E3/uL (ref 150–450)
RBC: 3.98 x10E6/uL (ref 3.77–5.28)
RDW: 12.3 % (ref 11.7–15.4)
WBC: 5.4 x10E3/uL (ref 3.4–10.8)

## 2024-05-16 LAB — CMP14+EGFR
ALT: 26 IU/L (ref 0–32)
AST: 29 IU/L (ref 0–40)
Albumin: 4 g/dL (ref 3.8–4.8)
Alkaline Phosphatase: 81 IU/L (ref 49–135)
BUN/Creatinine Ratio: 36 — ABNORMAL HIGH (ref 12–28)
BUN: 42 mg/dL — ABNORMAL HIGH (ref 8–27)
Bilirubin Total: <0.2 mg/dL (ref 0.0–1.2)
CO2: 20 mmol/L (ref 20–29)
Calcium: 9.1 mg/dL (ref 8.7–10.3)
Chloride: 112 mmol/L — ABNORMAL HIGH (ref 96–106)
Creatinine, Ser: 1.17 mg/dL — ABNORMAL HIGH (ref 0.57–1.00)
Globulin, Total: 2 g/dL (ref 1.5–4.5)
Glucose: 93 mg/dL (ref 70–99)
Potassium: 4.8 mmol/L (ref 3.5–5.2)
Sodium: 147 mmol/L — ABNORMAL HIGH (ref 134–144)
Total Protein: 6 g/dL (ref 6.0–8.5)
eGFR: 49 mL/min/1.73 — ABNORMAL LOW (ref >59)

## 2024-05-16 LAB — LIPID PANEL
Chol/HDL Ratio: 3 ratio (ref 0.0–4.4)
Cholesterol, Total: 166 mg/dL (ref 100–199)
HDL: 56 mg/dL (ref >39)
LDL Chol Calc (NIH): 87 mg/dL (ref 0–99)
Triglycerides: 129 mg/dL (ref 0–149)
VLDL Cholesterol Cal: 23 mg/dL (ref 5–40)

## 2024-05-16 LAB — VITAMIN D 25 HYDROXY (VIT D DEFICIENCY, FRACTURES): Vit D, 25-Hydroxy: 47.6 ng/mL (ref 30.0–100.0)

## 2024-05-17 ENCOUNTER — Ambulatory Visit: Payer: Self-pay

## 2024-05-17 DIAGNOSIS — Z87440 Personal history of urinary (tract) infections: Secondary | ICD-10-CM | POA: Diagnosis not present

## 2024-05-17 DIAGNOSIS — R399 Unspecified symptoms and signs involving the genitourinary system: Secondary | ICD-10-CM | POA: Diagnosis not present

## 2024-05-17 DIAGNOSIS — N200 Calculus of kidney: Secondary | ICD-10-CM

## 2024-05-17 DIAGNOSIS — R3121 Asymptomatic microscopic hematuria: Secondary | ICD-10-CM | POA: Diagnosis not present

## 2024-05-17 DIAGNOSIS — N390 Urinary tract infection, site not specified: Secondary | ICD-10-CM | POA: Diagnosis not present

## 2024-05-17 DIAGNOSIS — R8281 Pyuria: Secondary | ICD-10-CM | POA: Diagnosis not present

## 2024-05-18 LAB — TOXASSURE SELECT 13 (MW), URINE

## 2024-05-19 ENCOUNTER — Telehealth: Payer: Self-pay

## 2024-05-19 ENCOUNTER — Encounter: Admitting: Physical Therapy

## 2024-05-19 NOTE — Telephone Encounter (Signed)
 Copied from CRM [id number]. Topic: Clinical - Lab/Test Results >> May 19, 2024  8:28 AM Miquel SAILOR wrote: Reason for CRM: PT picking up lab results in 30 min from office from 10/13. Needs call back if needed . Pt stated julie said they would be there this morning [phone]

## 2024-05-22 ENCOUNTER — Ambulatory Visit: Admitting: Nurse Practitioner

## 2024-05-22 NOTE — Telephone Encounter (Signed)
-----   Message from Donnice Brooks sent at 05/22/2024  9:58 AM EDT ----- UA reported as benign with kidney stones. F/u as planned with [facility] prior. Thank you.  ----- Message ----- From: Sammie Exie HERO, CMA Sent: 05/17/2024   8:30 AM EDT To: Donnice Brooks, MD  Please review. Appt 02/16 ----- Message ----- From: Interface, Rad Results In Sent: 05/16/2024  10:26 PM EDT To: [facility] [facility]

## 2024-05-23 NOTE — Telephone Encounter (Signed)
 Called pt to give results per MD [place] pt confirmed upcoming appt and that she would got to [place] prior to appt

## 2024-05-23 NOTE — Telephone Encounter (Signed)
-----   Message from Donnice Brooks sent at 05/22/2024  9:58 AM EDT ----- UA reported as benign with kidney stones. F/u as planned with [facility] prior. Thank you.  ----- Message ----- From: Sammie Exie HERO, CMA Sent: 05/17/2024   8:30 AM EDT To: Donnice Brooks, MD  Please review. Appt 02/16 ----- Message ----- From: Interface, Rad Results In Sent: 05/16/2024  10:26 PM EDT To: [facility] [facility]

## 2024-05-26 ENCOUNTER — Ambulatory Visit: Admitting: Physical Therapy

## 2024-05-26 DIAGNOSIS — M79601 Pain in right arm: Secondary | ICD-10-CM

## 2024-05-26 DIAGNOSIS — M542 Cervicalgia: Secondary | ICD-10-CM

## 2024-05-26 DIAGNOSIS — M62838 Other muscle spasm: Secondary | ICD-10-CM

## 2024-05-26 NOTE — Therapy (Signed)
 OUTPATIENT PHYSICAL THERAPY CERVICAL TREATMENT   Patient Name: Brittany Hensley MRN: [id number] DOB:1951-04-09, 73 y.o., female Today's Date: 05/26/2024  END OF SESSION:  PT End of Session - 05/26/24 1147     Visit Number 13    Number of Visits 14    Date for Recertification  06/02/24    PT Start Time 1050    PT Stop Time 1152    PT Time Calculation (min) 62 min    Activity Tolerance Patient tolerated treatment well    Behavior During Therapy WFL for tasks assessed/performed              Past Medical History:  Diagnosis Date   Anemia    iron     Anxiety    Arthritis    COVID    2020 bad cold s/s lasted 1 week. 04/28/2021   Depression    Diverticulitis    Hx: of   GERD (gastroesophageal reflux disease)    at times for 15 yrs 04/28/2021   History of kidney stones    10 years ago   Hypothyroidism    Low iron     Pneumonia    2020   Rosacea    Hx: of   SCC (squamous cell carcinoma) 08/13/2021   in situ-left temple (CX35FU)   SCC (squamous cell carcinoma) 08/13/2021   well diff-right forehead (CX35FU)   Squamous cell carcinoma of skin 08/03/2021   in situ- left malar cheek (CX35FU)   Past Surgical History:  Procedure Laterality Date   APPENDECTOMY     BALLOON DILATION  06/16/2011   Procedure: BALLOON DILATION;  Surgeon: Oliva FORBES Boots, MD;  Location: [facility] ENDOSCOPY;  Service: Endoscopy;  Laterality: N/A;   BALLOON DILATION N/A 10/03/2013   Procedure: BALLOON DILATION;  Surgeon: Oliva FORBES Boots, MD;  Location: [facility] ENDOSCOPY;  Service: Endoscopy;  Laterality: N/A;   BALLOON DILATION N/A 08/10/2014   Procedure: BALLOON DILATION;  Surgeon: Oliva FORBES Boots, MD;  Location: [facility] ENDOSCOPY;  Service: Endoscopy;  Laterality: N/A;  from 12 - 15 cm dilation completed   BALLOON DILATION N/A 02/15/2015   Procedure: BALLOON DILATION;  Surgeon: Oliva Boots, MD;  Location: [facility] ENDOSCOPY;  Service: Endoscopy;  Laterality: N/A;   BALLOON DILATION N/A 11/12/2015   Procedure: BALLOON  DILATION;  Surgeon: Oliva Boots, MD;  Location: [facility] ENDOSCOPY;  Service: Endoscopy;  Laterality: N/A;   BALLOON DILATION N/A 08/10/2016   Procedure: BALLOON DILATION;  Surgeon: Oliva Boots, MD;  Location: [facility] ENDOSCOPY;  Service: Endoscopy;  Laterality: N/A;   BALLOON DILATION N/A 08/07/2016   Procedure: BALLOON DILATION;  Surgeon: Oliva Boots, MD;  Location: [facility] ENDOSCOPY;  Service: Endoscopy;  Laterality: N/A;   BALLOON DILATION N/A 05/20/2017   Procedure: BALLOON DILATION;  Surgeon: Boots Oliva, MD;  Location: [facility] ENDOSCOPY;  Service: Endoscopy;  Laterality: N/A;   BALLOON DILATION N/A 12/02/2017   Procedure: BALLOON DILATION;  Surgeon: Boots Oliva, MD;  Location: [facility] ENDOSCOPY;  Service: Endoscopy;  Laterality: N/A;   BALLOON DILATION N/A 07/19/2018   Procedure: BALLOON DILATION;  Surgeon: Boots Oliva, MD;  Location: [facility] ENDOSCOPY;  Service: Endoscopy;  Laterality: N/A;   BALLOON DILATION N/A 01/23/2019   Procedure: BALLOON DILATION;  Surgeon: Boots Oliva, MD;  Location: [facility] ENDOSCOPY;  Service: Endoscopy;  Laterality: N/A;   BALLOON DILATION N/A 07/13/2019   Procedure: BALLOON DILATION;  Surgeon: Boots Oliva, MD;  Location: [facility]OSCOPY;  Service: Endoscopy;  Laterality: N/A;   BALLOON DILATION N/A 02/02/2020   Procedure: BALLOON DILATION;  Surgeon: Rosalie Kitchens, MD;  Location: [facility]OSCOPY;  Service: Endoscopy;  Laterality: N/A;   BALLOON DILATION N/A 07/16/2020   Procedure: BALLOON DILATION;  Surgeon: Rosalie Kitchens, MD;  Location: [facility] ENDOSCOPY;  Service: Endoscopy;  Laterality: N/A;   BALLOON DILATION N/A 02/19/2021   Procedure: BALLOON DILATION;  Surgeon: Rosalie Kitchens, MD;  Location: [facility] ENDOSCOPY;  Service: Endoscopy;  Laterality: N/A;   BALLOON DILATION N/A 09/30/2021   Procedure: BALLOON DILATION;  Surgeon: Rosalie Kitchens, MD;  Location: [facility] ENDOSCOPY;  Service: Endoscopy;  Laterality: N/A;   BALLOON DILATION N/A 03/19/2022   Procedure: BALLOON DILATION;  Surgeon: Rosalie Kitchens, MD;  Location: [facility]  ENDOSCOPY;  Service: Gastroenterology;  Laterality: N/A;   BALLOON DILATION N/A 05/05/2023   Procedure: BALLOON DILATION;  Surgeon: Rosalie Kitchens, MD;  Location: [facility] ENDOSCOPY;  Service: Gastroenterology;  Laterality: N/A;   CATARACT EXTRACTION, BILATERAL     2019   CHOLECYSTECTOMY OPEN  1979   COLONOSCOPY     Hx: of   CYSTOSCOPY/URETEROSCOPY/HOLMIUM LASER/STENT PLACEMENT Bilateral 05/06/2021   Procedure: CYSTOSCOPY BILATERAL RETROGRADE LEFT URETEROSCOPY/HOLMIUM LASER/STENT PLACEMENT;  Surgeon: Watt Rush, MD;  Location: [facility]  Service: Urology;  Laterality: Bilateral;   ESOPHAGOGASTRODUODENOSCOPY  06/16/2011   Procedure: ESOPHAGOGASTRODUODENOSCOPY (EGD);  Surgeon: Kitchens FORBES Rosalie, MD;  Location: [facility] ENDOSCOPY;  Service: Endoscopy;  Laterality: N/A;   ESOPHAGOGASTRODUODENOSCOPY N/A 10/03/2013   Procedure: ESOPHAGOGASTRODUODENOSCOPY (EGD);  Surgeon: Kitchens FORBES Rosalie, MD;  Location: [facility]OSCOPY;  Service: Endoscopy;  Laterality: N/A;   ESOPHAGOGASTRODUODENOSCOPY N/A 08/10/2014   Procedure: ESOPHAGOGASTRODUODENOSCOPY (EGD);  Surgeon: Kitchens FORBES Rosalie, MD;  Location: [facility];  Service: Endoscopy;  Laterality: N/A;   ESOPHAGOGASTRODUODENOSCOPY N/A 08/10/2016   Procedure: ESOPHAGOGASTRODUODENOSCOPY (EGD);  Surgeon: Kitchens Rosalie, MD;  Location: [facility] ENDOSCOPY;  Service: Endoscopy;  Laterality: N/A;   ESOPHAGOGASTRODUODENOSCOPY N/A 07/19/2018   Procedure: ESOPHAGOGASTRODUODENOSCOPY (EGD);  Surgeon: Rosalie Kitchens, MD;  Location: [facility];  Service: Endoscopy;  Laterality: N/A;   ESOPHAGOGASTRODUODENOSCOPY (EGD) WITH PROPOFOL  N/A 02/15/2015   Procedure: ESOPHAGOGASTRODUODENOSCOPY (EGD) WITH PROPOFOL ;  Surgeon: Kitchens Rosalie, MD;  Location: [facility] ENDOSCOPY;  Service: Endoscopy;  Laterality: N/A;   ESOPHAGOGASTRODUODENOSCOPY (EGD) WITH PROPOFOL  N/A 11/12/2015   Procedure: ESOPHAGOGASTRODUODENOSCOPY (EGD) WITH PROPOFOL ;  Surgeon: Kitchens Rosalie, MD;  Location: [facility] ENDOSCOPY;  Service: Endoscopy;   Laterality: N/A;   ESOPHAGOGASTRODUODENOSCOPY (EGD) WITH PROPOFOL  N/A 08/07/2016   Procedure: ESOPHAGOGASTRODUODENOSCOPY (EGD) WITH PROPOFOL ;  Surgeon: Kitchens Rosalie, MD;  Location: [facility] ENDOSCOPY;  Service: Endoscopy;  Laterality: N/A;   ESOPHAGOGASTRODUODENOSCOPY (EGD) WITH PROPOFOL  N/A 05/20/2017   Procedure: ESOPHAGOGASTRODUODENOSCOPY (EGD) WITH PROPOFOL ;  Surgeon: Rosalie Kitchens, MD;  Location: [facility] ENDOSCOPY;  Service: Endoscopy;  Laterality: N/A;   ESOPHAGOGASTRODUODENOSCOPY (EGD) WITH PROPOFOL  N/A 12/02/2017   Procedure: ESOPHAGOGASTRODUODENOSCOPY (EGD) WITH PROPOFOL ;  Surgeon: Rosalie Kitchens, MD;  Location: [facility] ENDOSCOPY;  Service: Endoscopy;  Laterality: N/A;  have c arm available   ESOPHAGOGASTRODUODENOSCOPY (EGD) WITH PROPOFOL  N/A 01/23/2019   Procedure: ESOPHAGOGASTRODUODENOSCOPY (EGD) WITH PROPOFOL ;  Surgeon: Rosalie Kitchens, MD;  Location: [facility] ENDOSCOPY;  Service: Endoscopy;  Laterality: N/A;   ESOPHAGOGASTRODUODENOSCOPY (EGD) WITH PROPOFOL  N/A 07/13/2019   Procedure: ESOPHAGOGASTRODUODENOSCOPY (EGD) WITH PROPOFOL ;  Surgeon: Rosalie Kitchens, MD;  Location: [facility] ENDOSCOPY;  Service: Endoscopy;  Laterality: N/A;   ESOPHAGOGASTRODUODENOSCOPY (EGD) WITH PROPOFOL  N/A 02/02/2020   Procedure: ESOPHAGOGASTRODUODENOSCOPY (EGD) WITH PROPOFOL ;  Surgeon: Rosalie Kitchens, MD;  Location: [facility] END[facility];  Service: Endoscopy;  Laterality: N/A;   ESOPHAGOGASTRODUODENOSCOPY (EGD) WITH PROPOFOL  N/A 07/16/2020   Procedure: ESOPHAGOGASTRODUODENOSCOPY (EGD) WITH PROPOFOL ;  Surgeon: Rosalie Kitchens, MD;  Location:  [facility] ENDOSCOPY;  Service: Endoscopy;  Laterality: N/A;   ESOPHAGOGASTRODUODENOSCOPY (EGD) WITH PROPOFOL  N/A 02/19/2021   Procedure: ESOPHAGOGASTRODUODENOSCOPY (EGD) WITH PROPOFOL ;  Surgeon: Rosalie Kitchens, MD;  Location: [facility] ENDOSCOPY;  Service: Endoscopy;  Laterality: N/A;  with possible botox    ESOPHAGOGASTRODUODENOSCOPY (EGD) WITH PROPOFOL  N/A 09/30/2021   Procedure: ESOPHAGOGASTRODUODENOSCOPY (EGD) WITH PROPOFOL ;  Surgeon: Rosalie Kitchens, MD;  Location: [facility]OS[facility];  Service: Endoscopy;  Laterality: N/A;   ESOPHAGOGASTRODUODENOSCOPY (EGD) WITH PROPOFOL  N/A 03/19/2022   Procedure: ESOPHAGOGASTRODUODENOSCOPY (EGD) WITH PROPOFOL ;  Surgeon: Rosalie Kitchens, MD;  Location: [facility] ENDOSCOPY;  Service: Gastroenterology;  Laterality: N/A;   ESOPHAGOGASTRODUODENOSCOPY (EGD) WITH PROPOFOL  N/A 05/05/2023   Procedure: ESOPHAGOGASTRODUODENOSCOPY (EGD) WITH PROPOFOL ;  Surgeon: Rosalie Kitchens, MD;  Location: [facility] ENDOSCOPY;  Service: Gastroenterology;  Laterality: N/A;  with balloon dilation   FOREIGN BODY REMOVAL  07/19/2018   Procedure: FOREIGN BODY REMOVAL;  Surgeon: Rosalie Kitchens, MD;  Location: [facility] ENDOSCOPY;  Service: Endoscopy;;   FRACTURE SURGERY     Hx: of left wrist 01/2021   GASTRECTOMY  [phone]   GASTRECTOMY N/A    X3   LITHOTRIPSY     Hx; of for kidney stones [id number]   SAVORY DILATION N/A 08/10/2014   Procedure: SAVORY DILATION;  Surgeon: Kitchens FORBES Rosalie, MD;  Location: [facility] ENDOSCOPY;  Service: Endoscopy;  Laterality: N/A;   TOTAL HIP ARTHROPLASTY Right 12/30/2017   Procedure: RIGHT TOTAL HIP ARTHROPLASTY;  Surgeon: Margrette Taft FORBES, MD;  Location: [facility];  Service: Orthopedics;  Laterality: Right;   TOTAL HIP ARTHROPLASTY Left 02/17/2018   Procedure: TOTAL HIP ARTHROPLASTY;  Surgeon: Margrette Taft FORBES, MD;  Location: [facility];  Service: Orthopedics;  Laterality: Left;   TUBAL LIGATION     1975   Patient Active Problem List   Diagnosis Date Noted   CKD stage 3a, GFR 45-59 ml/min (HCC) 05/16/2024   Stricture of esophagus 08/21/2022   Closed nondisplaced oblique fracture of shaft of ulna with routine healing 03/20/2021   Closed fracture of lower end of right ulna 01/11/2021   Oral phase dysphagia 12/19/2020   Slow transit constipation 12/19/2020   Weight decreased 12/19/2020   Acquired hypertrophic pyloric stenosis 12/19/2020   Osteoporosis without pathological fracture 06/18/2020   Anemia, unspecified 06/27/2018   Age-related  osteoporosis with current pathological fracture of right femur (HCC) 03/01/2018   Status post total hip replacement, left 02/17/18 02/17/2018   Closed displaced fracture of left femoral neck (HCC) 02/14/2018   S/P hip replacement, right 12/30/17 01/13/2018   Closed displaced fracture of right femoral neck (HCC) 12/30/2017   BMI less than 19,adult 01/10/2016   GAD (generalized anxiety disorder) 01/10/2016   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   Hypothyroidism 04/24/2013   GERD (gastroesophageal reflux disease) 04/24/2013   Rosacea 04/24/2013   Iron  deficiency anemia due to chronic blood loss 06/09/2012   Gastric outlet obstruction 06/16/2011   Atrioventricular nodal re-entry tachycardia (HCC) 03/11/2010   MURMUR 03/11/2010   PERSONAL HISTORY OF URINARY CALCULI 02/14/2008   REFERRING PROVIDER: Georgina Ozell LABOR, MD   REFERRING DIAG: Cervicalgia   THERAPY DIAG:  Cervicalgia  Other muscle spasm  Pain in right arm  Rationale for Evaluation and Treatment: Rehabilitation  ONSET DATE: 6-7 months  SUBJECTIVE:  SUBJECTIVE STATEMENT: Neck is much better.  Right UE pain is 3/10.    PERTINENT HISTORY:  Osteoporosis, anxiety, arthritis,history of COVID-19, depression, and allergies  PAIN:  Are you having pain? Yes: NPRS scale: 3/10 Pain location: Bil neck   PRECAUTIONS: None  RED FLAGS: None    WEIGHT BEARING RESTRICTIONS: No  FALLS:  Has patient fallen in last 6 months? No  LIVING ENVIRONMENT: Lives with: lives alone Lives in: House/apartment  OCCUPATION: retired   PLOF: Independent  PATIENT GOALS: reduced pain and improved mobility  NEXT MD VISIT: only as needed  OBJECTIVE:  Note: Objective measures were completed at Evaluation unless otherwise  noted.  DIAGNOSTIC FINDINGS: 03/22/24 cervical x-ray grade 1 spondylolisthesis at C5/6.  No evidence of  instability on flexion/extension views.  Disc height loss with anterior osteophyte formation at C5/6 and C6/7.  No fracture or dislocation seen.   PATIENT SURVEYS:  NDI:  NECK DISABILITY INDEX  Date: 03/29/24 Score  Pain intensity 1 = The pain is very mild at the moment  2. Personal care (washing, dressing, etc.) 0 = I can look after myself normally without causing extra pain  3. Lifting 1 =  I can lift heavy weights but it gives extra pain  4. Reading 1 = I can read as much as I want to with slight pain in my neck  5. Headaches 0 = I have no headaches at all  6. Concentration 0 =  I can concentrate fully when I want to with no difficulty  7. Work 1 =  I can only do my usual work, but no more  8. Driving 0 = I can drive my car without any neck pain  9. Sleeping 0 = I have no trouble sleeping  10. Recreation 0 = I am able to engage in all my recreation activities with no neck pain at all  Total 4/50   Minimum Detectable Change (90% confidence): 5 points or 10% points  COGNITION: Overall cognitive status: Within functional limits for tasks assessed  SENSATION: Patient reports no numbness or tingling  POSTURE: rounded shoulders and forward head  PALPATION: TTP: bilateral scapular stabilizers, right upper trapezius, levator scapulae, and cervical paraspinals   CERVICAL ROM: significant difficulty isolating cervical rotation both directions  Active ROM A/PROM (deg) eval  Flexion 48  Extension 42; sore  Right lateral flexion 32; tension   Left lateral flexion 36  Right rotation 40  Left rotation 45   (Blank rows = not tested)  UPPER EXTREMITY ROM: WFL for activities assessed  UPPER EXTREMITY MMT:  MMT Right eval Left eval  Shoulder flexion 4/5 4/5  Shoulder extension    Shoulder abduction 4/5 4/5  Shoulder adduction    Shoulder extension    Shoulder internal  rotation    Shoulder external rotation    Middle trapezius    Lower trapezius    Elbow flexion 4/5 4+/5  Elbow extension 3+/5 4/5  Wrist flexion    Wrist extension    Wrist ulnar deviation    Wrist radial deviation    Wrist pronation    Wrist supination    Grip strength 15 18   (Blank rows = not tested)  CERVICAL SPECIAL TESTS:  Spurling's test: Negative and Sharp pursor's test: Negative  TREATMENT DATE:    05/26/24:  UBE x 8 minutes f/b RW4 with yellow theraband f/b STW/M x 25 minutes to patient's right cervical and over the length of her right UE to forearm f/b HMP and  IFC at 100% scan x 20 minutes.    05/12/24:  Combo e'stim/US  at 1.50 W/CM2 x 12 minutes to patient's right cervical and middle deltoid region f/b STW/M x 12 minutes f/b HMP and IFC at 80-150 Hz on 40% scan x 20 minutes. Normal modality resposne following removal of modality.   05/09/24:  Low-level combo e'stim/US  at 1.50 W/CM2 x 12 minutes (6 minutes to RT upper Trap and  RT shldr)  STW/M x 13  minutes To RT Utrap , cerv paras and RT deltoid.   HMP and IFC at 80-150 Hz on 40% scan x 20 minutes.  Normal modality response following removal of modality.                                                                                                                           PATIENT EDUCATION:  Education details: plan for physical therapy, taking medications as prescribed Person educated: Patient Education method: Explanation Education comprehension: verbalized understanding  HOME EXERCISE PROGRAM:   Clinical impression statement: Patient very pleased with her overall progress.  Her neck is feeling much better.  Her CC is right UE pain but lower.  She performed the RW4 exercises with yellow theraband.  Pictures provided.    OBJECTIVE IMPAIRMENTS: decreased mobility, decreased ROM, decreased strength, hypomobility, impaired tone, impaired UE functional use, postural dysfunction, and pain.   ACTIVITY LIMITATIONS:  lifting and sleeping  PARTICIPATION LIMITATIONS: meal prep, cleaning, and laundry  PERSONAL FACTORS: Past/current experiences, Time since onset of injury/illness/exacerbation, and 3+ comorbidities: Osteoporosis, anxiety, arthritis,history of COVID-19, depression, and allergies are also affecting patient's functional outcome.   REHAB POTENTIAL: Good  CLINICAL DECISION MAKING: Evolving/moderate complexity  EVALUATION COMPLEXITY: Moderate   GOALS: Goals reviewed with patient? Yes  LONG TERM GOALS: Target date: 04/26/24  Patient will be independent with her HEP. Baseline:  Goal status: Partially met.  2.  Patient will be able to demonstrate at least 50 degrees of cervical rotation bilaterally for improved awareness of her surroundings. Baseline: Bilateral 45 degrees. Goal status: Partially met.  3.  Patient will improve her right triceps strength to at least 4/5 for improved function reaching and picking up items at home. Baseline:  Goal status: On going  PLAN:  PT FREQUENCY: 1-2x/week  PT DURATION: 4 weeks  PLANNED INTERVENTIONS: 97164- PT Re-evaluation, 97750- Physical Performance Testing, 97110-Therapeutic exercises, 97530- Therapeutic activity, [id number]- Neuromuscular re-education, 97535- Self Care, 02859- Manual therapy, G0283- Electrical stimulation (unattended), 02987- Traction (mechanical), 20560 (1-2 muscles), 20561 (3+ muscles)- Dry Needling, Patient/Family education, Joint mobilization, Spinal mobilization, Cryotherapy, and Moist heat  PLAN FOR NEXT SESSION: UBE, manual therapy, scapular retraction, upper extremity strengthening, and modalities as needed      Taraya Steward, [place], PT 05/26/2024, 1:00 PM

## 2024-05-29 ENCOUNTER — Telehealth: Payer: Self-pay | Admitting: Family Medicine

## 2024-05-29 NOTE — Telephone Encounter (Signed)
 Pt states Allegra works better.

## 2024-05-29 NOTE — Telephone Encounter (Signed)
 Copied from CRM [id number]. Topic: Clinical - Medication Question >> May 29, 2024  8:19 AM Cherylann RAMAN wrote: Reason for CRM: Patient is calling in to get a refill on the Allegra that was prescribed to her at an urgent care back in July 2025. The Claritin  was switched to Allegra 180 MG Once daily as the Allegra has been working better for the patient. Please contact patient at [phone] for additional information.

## 2024-05-29 NOTE — Telephone Encounter (Signed)
 Chart says claritin . Which would she rather have. Allegra or claritin ?

## 2024-05-30 MED ORDER — FEXOFENADINE HCL 180 MG PO TABS: 180.0000 mg | ORAL_TABLET | Freq: Every day | ORAL | 1 refills | Status: AC

## 2024-05-30 NOTE — Addendum Note (Signed)
 Addended by: Kaelah Hayashi, MARY-MARGARET on: 05/30/2024 04:47 PM   Modules accepted: Orders

## 2024-06-01 ENCOUNTER — Ambulatory Visit: Admitting: Nurse Practitioner

## 2024-06-01 ENCOUNTER — Ambulatory Visit: Payer: PPO | Admitting: Urology

## 2024-06-02 ENCOUNTER — Ambulatory Visit: Admitting: Physical Therapy

## 2024-06-02 DIAGNOSIS — M542 Cervicalgia: Secondary | ICD-10-CM | POA: Diagnosis not present

## 2024-06-02 DIAGNOSIS — M79601 Pain in right arm: Secondary | ICD-10-CM

## 2024-06-02 DIAGNOSIS — M62838 Other muscle spasm: Secondary | ICD-10-CM

## 2024-06-02 NOTE — Therapy (Addendum)
 OUTPATIENT PHYSICAL THERAPY CERVICAL TREATMENT   Patient Name: Brittany Hensley MRN: [id number] DOB:1951/03/20, 73 y.o., female Today's Date: 06/02/2024  END OF SESSION:  PT End of Session - 06/02/24 1134     Visit Number 14    Number of Visits 16    Date for Recertification  06/16/24    PT Start Time 1100    PT Stop Time 1149    PT Time Calculation (min) 49 min    Activity Tolerance Patient tolerated treatment well    Behavior During Therapy WFL for tasks assessed/performed               Past Medical History:  Diagnosis Date   Anemia    iron     Anxiety    Arthritis    COVID    2020 bad cold s/s lasted 1 week. 04/28/2021   Depression    Diverticulitis    Hx: of   GERD (gastroesophageal reflux disease)    at times for 15 yrs 04/28/2021   History of kidney stones    10 years ago   Hypothyroidism    Low iron     Pneumonia    2020   Rosacea    Hx: of   SCC (squamous cell carcinoma) 08/13/2021   in situ-left temple (CX35FU)   SCC (squamous cell carcinoma) 08/13/2021   well diff-right forehead (CX35FU)   Squamous cell carcinoma of skin 08/03/2021   in situ- left malar cheek (CX35FU)   Past Surgical History:  Procedure Laterality Date   APPENDECTOMY     BALLOON DILATION  06/16/2011   Procedure: BALLOON DILATION;  Surgeon: Oliva FORBES Boots, MD;  Location: [facility] ENDOSCOPY;  Service: Endoscopy;  Laterality: N/A;   BALLOON DILATION N/A 10/03/2013   Procedure: BALLOON DILATION;  Surgeon: Oliva FORBES Boots, MD;  Location: [facility] ENDOSCOPY;  Service: Endoscopy;  Laterality: N/A;   BALLOON DILATION N/A 08/10/2014   Procedure: BALLOON DILATION;  Surgeon: Oliva FORBES Boots, MD;  Location: [facility] ENDOSCOPY;  Service: Endoscopy;  Laterality: N/A;  from 12 - 15 cm dilation completed   BALLOON DILATION N/A 02/15/2015   Procedure: BALLOON DILATION;  Surgeon: Oliva Boots, MD;  Location: [facility] ENDOSCOPY;  Service: Endoscopy;  Laterality: N/A;   BALLOON DILATION N/A 11/12/2015   Procedure: BALLOON  DILATION;  Surgeon: Oliva Boots, MD;  Location: [facility] ENDOSCOPY;  Service: Endoscopy;  Laterality: N/A;   BALLOON DILATION N/A 08/10/2016   Procedure: BALLOON DILATION;  Surgeon: Oliva Boots, MD;  Location: [facility] ENDOSCOPY;  Service: Endoscopy;  Laterality: N/A;   BALLOON DILATION N/A 08/07/2016   Procedure: BALLOON DILATION;  Surgeon: Oliva Boots, MD;  Location: [facility] ENDOSCOPY;  Service: Endoscopy;  Laterality: N/A;   BALLOON DILATION N/A 05/20/2017   Procedure: BALLOON DILATION;  Surgeon: Boots Oliva, MD;  Location: [facility] ENDOSCOPY;  Service: Endoscopy;  Laterality: N/A;   BALLOON DILATION N/A 12/02/2017   Procedure: BALLOON DILATION;  Surgeon: Boots Oliva, MD;  Location: [facility] ENDOSCOPY;  Service: Endoscopy;  Laterality: N/A;   BALLOON DILATION N/A 07/19/2018   Procedure: BALLOON DILATION;  Surgeon: Boots Oliva, MD;  Location: [facility] ENDOSCOPY;  Service: Endoscopy;  Laterality: N/A;   BALLOON DILATION N/A 01/23/2019   Procedure: BALLOON DILATION;  Surgeon: Boots Oliva, MD;  Location: [facility] ENDOSCOPY;  Service: Endoscopy;  Laterality: N/A;   BALLOON DILATION N/A 07/13/2019   Procedure: BALLOON DILATION;  Surgeon: Boots Oliva, MD;  Location: [facility] ENDOSCOPY;  Service: Endoscopy;  Laterality: N/A;   BALLOON DILATION N/A 02/02/2020   Procedure: BALLOON  DILATION;  Surgeon: Rosalie Kitchens, MD;  Location: [facility];  Service: Endoscopy;  Laterality: N/A;   BALLOON DILATION N/A 07/16/2020   Procedure: BALLOON DILATION;  Surgeon: Rosalie Kitchens, MD;  Location: [facility] ENDOSCOPY;  Service: Endoscopy;  Laterality: N/A;   BALLOON DILATION N/A 02/19/2021   Procedure: BALLOON DILATION;  Surgeon: Rosalie Kitchens, MD;  Location: [facility] ENDOSCOPY;  Service: Endoscopy;  Laterality: N/A;   BALLOON DILATION N/A 09/30/2021   Procedure: BALLOON DILATION;  Surgeon: Rosalie Kitchens, MD;  Location: [facility] ENDOSCOPY;  Service: Endoscopy;  Laterality: N/A;   BALLOON DILATION N/A 03/19/2022   Procedure: BALLOON DILATION;  Surgeon: Rosalie Kitchens, MD;  Location: [facility]  ENDOSCOPY;  Service: Gastroenterology;  Laterality: N/A;   BALLOON DILATION N/A 05/05/2023   Procedure: BALLOON DILATION;  Surgeon: Rosalie Kitchens, MD;  Location: [facility] ENDOSCOPY;  Service: Gastroenterology;  Laterality: N/A;   CATARACT EXTRACTION, BILATERAL     2019   CHOLECYSTECTOMY OPEN  1979   COLONOSCOPY     Hx: of   CYSTOSCOPY/URETEROSCOPY/HOLMIUM LASER/STENT PLACEMENT Bilateral 05/06/2021   Procedure: CYSTOSCOPY BILATERAL RETROGRADE LEFT URETEROSCOPY/HOLMIUM LASER/STENT PLACEMENT;  Surgeon: Watt Rush, MD;  [facility]  Service: Urology;  Laterality: Bilateral;   ESOPHAGOGASTRODUODENOSCOPY  06/16/2011   Procedure: ESOPHAGOGASTRODUODENOSCOPY (EGD);  Surgeon: Kitchens FORBES Rosalie, MD;  Location: [facility] ENDOSCOPY;  Service: Endoscopy;  Laterality: N/A;   ESOPHAGOGASTRODUODENOSCOPY N/A 10/03/2013   Procedure: ESOPHAGOGASTRODUODENOSCOPY (EGD);  Surgeon: Kitchens FORBES Rosalie, MD;  Location: [facility]COPY;  Service: Endoscopy;  Laterality: N/A;   ESOPHAGOGASTRODUODENOSCOPY N/A 08/10/2014   Procedure: ESOPHAGOGASTRODUODENOSCOPY (EGD);  Surgeon: Kitchens FORBES Rosalie, MD;  Location: [facility];  Service: Endoscopy;  Laterality: N/A;   ESOPHAGOGASTRODUODENOSCOPY N/A 08/10/2016   Procedure: ESOPHAGOGASTRODUODENOSCOPY (EGD);  Surgeon: Kitchens Rosalie, MD;  Location: [facility] ENDOSCOPY;  Service: Endoscopy;  Laterality: N/A;   ESOPHAGOGASTRODUODENOSCOPY N/A 07/19/2018   Procedure: ESOPHAGOGASTRODUODENOSCOPY (EGD);  Surgeon: Rosalie Kitchens, MD;  Location: [facility];  Service: Endoscopy;  Laterality: N/A;   ESOPHAGOGASTRODUODENOSCOPY (EGD) WITH PROPOFOL  N/A 02/15/2015   Procedure: ESOPHAGOGASTRODUODENOSCOPY (EGD) WITH PROPOFOL ;  Surgeon: Kitchens Rosalie, MD;  Location: [facility] ENDOSCOPY;  Service: Endoscopy;  Laterality: N/A;   ESOPHAGOGASTRODUODENOSCOPY (EGD) WITH PROPOFOL  N/A 11/12/2015   Procedure: ESOPHAGOGASTRODUODENOSCOPY (EGD) WITH PROPOFOL ;  Surgeon: Kitchens Rosalie, MD;  Location: [facility] ENDOSCOPY;  Service: Endoscopy;   Laterality: N/A;   ESOPHAGOGASTRODUODENOSCOPY (EGD) WITH PROPOFOL  N/A 08/07/2016   Procedure: ESOPHAGOGASTRODUODENOSCOPY (EGD) WITH PROPOFOL ;  Surgeon: Kitchens Rosalie, MD;  Location: [facility] ENDOSCOPY;  Service: Endoscopy;  Laterality: N/A;   ESOPHAGOGASTRODUODENOSCOPY (EGD) WITH PROPOFOL  N/A 05/20/2017   Procedure: ESOPHAGOGASTRODUODENOSCOPY (EGD) WITH PROPOFOL ;  Surgeon: Rosalie Kitchens, MD;  Location: [facility] ENDOSCOPY;  Service: Endoscopy;  Laterality: N/A;   ESOPHAGOGASTRODUODENOSCOPY (EGD) WITH PROPOFOL  N/A 12/02/2017   Procedure: ESOPHAGOGASTRODUODENOSCOPY (EGD) WITH PROPOFOL ;  Surgeon: Rosalie Kitchens, MD;  Location: [facility] ENDOSCOPY;  Service: Endoscopy;  Laterality: N/A;  have c arm available   ESOPHAGOGASTRODUODENOSCOPY (EGD) WITH PROPOFOL  N/A 01/23/2019   Procedure: ESOPHAGOGASTRODUODENOSCOPY (EGD) WITH PROPOFOL ;  Surgeon: Rosalie Kitchens, MD;  Location: [facility] ENDOSCOPY;  Service: Endoscopy;  Laterality: N/A;   ESOPHAGOGASTRODUODENOSCOPY (EGD) WITH PROPOFOL  N/A 07/13/2019   Procedure: ESOPHAGOGASTRODUODENOSCOPY (EGD) WITH PROPOFOL ;  Surgeon: Rosalie Kitchens, MD;  Location: [facility] ENDOSCOPY;  Service: Endoscopy;  Laterality: N/A;   ESOPHAGOGASTRODUODENOSCOPY (EGD) WITH PROPOFOL  N/A 02/02/2020   Procedure: ESOPHAGOGASTRODUODENOSCOPY (EGD) WITH PROPOFOL ;  Surgeon: Rosalie Kitchens, MD;  Location: [facility] ENDOS[facility];  Service: Endoscopy;  Laterality: N/A;   ESOPHAGOGASTRODUODENOSCOPY (EGD) WITH PROPOFOL  N/A 07/16/2020   Procedure: ESOPHAGOGASTRODUODENOSCOPY (EGD) WITH PROPOFOL ;  Surgeon: Rosalie Kitchens, MD;  Location: [facility] ENDOSCOPY;  Service: Endoscopy;  Laterality: N/A;   ESOPHAGOGASTRODUODENOSCOPY (EGD) WITH PROPOFOL  N/A 02/19/2021   Procedure: ESOPHAGOGASTRODUODENOSCOPY (EGD) WITH PROPOFOL ;  Surgeon: Rosalie Kitchens, MD;  Location: [facility] ENDOSCOPY;  Service: Endoscopy;  Laterality: N/A;  with possible botox    ESOPHAGOGASTRODUODENOSCOPY (EGD) WITH PROPOFOL  N/A 09/30/2021   Procedure: ESOPHAGOGASTRODUODENOSCOPY (EGD) WITH PROPOFOL ;  Surgeon: Rosalie Kitchens, MD;  Location: [facility] ENDOS[facility];  Service: Endoscopy;  Laterality: N/A;   ESOPHAGOGASTRODUODENOSCOPY (EGD) WITH PROPOFOL  N/A 03/19/2022   Procedure: ESOPHAGOGASTRODUODENOSCOPY (EGD) WITH PROPOFOL ;  Surgeon: Rosalie Kitchens, MD;  Location: [facility] ENDOSCOPY;  Service: Gastroenterology;  Laterality: N/A;   ESOPHAGOGASTRODUODENOSCOPY (EGD) WITH PROPOFOL  N/A 05/05/2023   Procedure: ESOPHAGOGASTRODUODENOSCOPY (EGD) WITH PROPOFOL ;  Surgeon: Rosalie Kitchens, MD;  Location: [facility] ENDOSCOPY;  Service: Gastroenterology;  Laterality: N/A;  with balloon dilation   FOREIGN BODY REMOVAL  07/19/2018   Procedure: FOREIGN BODY REMOVAL;  Surgeon: Rosalie Kitchens, MD;  Location: [facility] ENDOSCOPY;  Service: Endoscopy;;   FRACTURE SURGERY     Hx: of left wrist 01/2021   GASTRECTOMY  [phone]   GASTRECTOMY N/A    X3   LITHOTRIPSY     Hx; of for kidney stones [id number]   SAVORY DILATION N/A 08/10/2014   Procedure: SAVORY DILATION;  Surgeon: Kitchens FORBES Rosalie, MD;  Location: [facility] ENDOSCOPY;  Service: Endoscopy;  Laterality: N/A;   TOTAL HIP ARTHROPLASTY Right 12/30/2017   Procedure: RIGHT TOTAL HIP ARTHROPLASTY;  Surgeon: Margrette Taft FORBES, MD;  Location: [facility];  Service: Orthopedics;  Laterality: Right;   TOTAL HIP ARTHROPLASTY Left 02/17/2018   Procedure: TOTAL HIP ARTHROPLASTY;  Surgeon: Margrette Taft FORBES, MD;  Location: [facility];  Service: Orthopedics;  Laterality: Left;   TUBAL LIGATION     1975   Patient Active Problem List   Diagnosis Date Noted   CKD stage 3a, GFR 45-59 ml/min (HCC) 05/16/2024   Stricture of esophagus 08/21/2022   Closed nondisplaced oblique fracture of shaft of ulna with routine healing 03/20/2021   Closed fracture of lower end of right ulna 01/11/2021   Oral phase dysphagia 12/19/2020   Slow transit constipation 12/19/2020   Weight decreased 12/19/2020   Acquired hypertrophic pyloric stenosis 12/19/2020   Osteoporosis without pathological fracture 06/18/2020   Anemia, unspecified 06/27/2018   Age-related  osteoporosis with current pathological fracture of right femur (HCC) 03/01/2018   Status post total hip replacement, left 02/17/18 02/17/2018   Closed displaced fracture of left femoral neck (HCC) 02/14/2018   S/P hip replacement, right 12/30/17 01/13/2018   Closed displaced fracture of right femoral neck (HCC) 12/30/2017   BMI less than 19,adult 01/10/2016   GAD (generalized anxiety disorder) 01/10/2016   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   Hypothyroidism 04/24/2013   GERD (gastroesophageal reflux disease) 04/24/2013   Rosacea 04/24/2013   Iron  deficiency anemia due to chronic blood loss 06/09/2012   Gastric outlet obstruction 06/16/2011   Atrioventricular nodal re-entry tachycardia (HCC) 03/11/2010   MURMUR 03/11/2010   PERSONAL HISTORY OF URINARY CALCULI 02/14/2008   REFERRING PROVIDER: Georgina Ozell LABOR, MD   REFERRING DIAG: Cervicalgia   THERAPY DIAG:  Cervicalgia - Plan: PT plan of care cert/re-cert  Other muscle spasm - Plan: PT plan of care cert/re-cert  Pain in right arm - Plan: PT plan of care cert/re-cert  Rationale for Evaluation and Treatment: Rehabilitation  ONSET DATE: 6-7 months  SUBJECTIVE:  SUBJECTIVE STATEMENT: Pain down to a 2.  Patient would like a couple more visits.  Band exercises help.    PERTINENT HISTORY:  Osteoporosis, anxiety, arthritis,history of COVID-19, depression, and allergies  PAIN:  Are you having pain? Yes: NPRS scale: 3/10 Pain location: Bil neck   PRECAUTIONS: None  RED FLAGS: None    WEIGHT BEARING RESTRICTIONS: No  FALLS:  Has patient fallen in last 6 months? No  LIVING ENVIRONMENT: Lives with: lives alone Lives in: House/apartment  OCCUPATION: retired   PLOF: Independent  PATIENT GOALS: reduced pain and improved  mobility  NEXT MD VISIT: only as needed  OBJECTIVE:  Note: Objective measures were completed at Evaluation unless otherwise noted.  DIAGNOSTIC FINDINGS: 03/22/24 cervical x-ray grade 1 spondylolisthesis at C5/6.  No evidence of  instability on flexion/extension views.  Disc height loss with anterior osteophyte formation at C5/6 and C6/7.  No fracture or dislocation seen.   PATIENT SURVEYS:  NDI:  NECK DISABILITY INDEX  Date: 03/29/24 Score  Pain intensity 1 = The pain is very mild at the moment  2. Personal care (washing, dressing, etc.) 0 = I can look after myself normally without causing extra pain  3. Lifting 1 =  I can lift heavy weights but it gives extra pain  4. Reading 1 = I can read as much as I want to with slight pain in my neck  5. Headaches 0 = I have no headaches at all  6. Concentration 0 =  I can concentrate fully when I want to with no difficulty  7. Work 1 =  I can only do my usual work, but no more  8. Driving 0 = I can drive my car without any neck pain  9. Sleeping 0 = I have no trouble sleeping  10. Recreation 0 = I am able to engage in all my recreation activities with no neck pain at all  Total 4/50   Minimum Detectable Change (90% confidence): 5 points or 10% points  COGNITION: Overall cognitive status: Within functional limits for tasks assessed  SENSATION: Patient reports no numbness or tingling  POSTURE: rounded shoulders and forward head  PALPATION: TTP: bilateral scapular stabilizers, right upper trapezius, levator scapulae, and cervical paraspinals   CERVICAL ROM: significant difficulty isolating cervical rotation both directions  Active ROM A/PROM (deg) eval  Flexion 48  Extension 42; sore  Right lateral flexion 32; tension   Left lateral flexion 36  Right rotation 40  Left rotation 45   (Blank rows = not tested)  UPPER EXTREMITY ROM: WFL for activities assessed  UPPER EXTREMITY MMT:  MMT Right eval Left eval  Shoulder flexion  4/5 4/5  Shoulder extension    Shoulder abduction 4/5 4/5  Shoulder adduction    Shoulder extension    Shoulder internal rotation    Shoulder external rotation    Middle trapezius    Lower trapezius    Elbow flexion 4/5 4+/5  Elbow extension 3+/5 4/5  Wrist flexion    Wrist extension    Wrist ulnar deviation    Wrist radial deviation    Wrist pronation    Wrist supination    Grip strength 15 18   (Blank rows = not tested)  CERVICAL SPECIAL TESTS:  Spurling's test: Negative and Sharp pursor's test: Negative  TREATMENT DATE:    06/02/24:   UBE x 8 minutes f/b STW/M x 15 minutes to patient's right cervical and over the length of her right UE to forearm  f/b HMP and IFC at 100% scan x 20 minutes.     05/26/24:  UBE x 8 minutes f/b RW4 with yellow theraband f/b STW/M x 25 minutes to patient's right cervical and over the length of her right UE to forearm f/b HMP and IFC at 100% scan x 20 minutes.    05/12/24:  Combo e'stim/US  at 1.50 W/CM2 x 12 minutes to patient's right cervical and middle deltoid region f/b STW/M x 12 minutes f/b HMP and IFC at 80-150 Hz on 40% scan x 20 minutes. Normal modality resposne following removal of modality.                                                                                                                          PATIENT EDUCATION:  Education details: plan for physical therapy, taking medications as prescribed Person educated: Patient Education method: Explanation Education comprehension: verbalized understanding  HOME EXERCISE PROGRAM:   Clinical impression statement: Patient very pleased with her overall progress.  Her neck is feeling much better and pain down to a 2.  Plan for two more visits.  LTG #3 met today.    OBJECTIVE IMPAIRMENTS: decreased mobility, decreased ROM, decreased strength, hypomobility, impaired tone, impaired UE functional use, postural dysfunction, and pain.   ACTIVITY LIMITATIONS: lifting and  sleeping  PARTICIPATION LIMITATIONS: meal prep, cleaning, and laundry  PERSONAL FACTORS: Past/current experiences, Time since onset of injury/illness/exacerbation, and 3+ comorbidities: Osteoporosis, anxiety, arthritis,history of COVID-19, depression, and allergies are also affecting patient's functional outcome.   REHAB POTENTIAL: Good  CLINICAL DECISION MAKING: Evolving/moderate complexity  EVALUATION COMPLEXITY: Moderate   GOALS: Goals reviewed with patient? Yes  LONG TERM GOALS: Target date: 04/26/24  Patient will be independent with her HEP. Baseline:  Goal status: Partially met.  2.  Patient will be able to demonstrate at least 50 degrees of cervical rotation bilaterally for improved awareness of her surroundings. Baseline: Bilateral 45 degrees. Goal status: Partially met.  3.  Patient will improve her right triceps strength to at least 4/5 for improved function reaching and picking up items at home. Baseline: MET   PLAN:  PT FREQUENCY: 1-2x/week  PT DURATION: 4 weeks  PLANNED INTERVENTIONS: 97164- PT Re-evaluation, 97750- Physical Performance Testing, 97110-Therapeutic exercises, 97530- Therapeutic activity, [id number]- Neuromuscular re-education, 97535- Self Care, [id number]- Manual therapy, [id number]- Electrical stimulation (unattended), [phone]- Traction (mechanical), 20560 (1-2 muscles), 20561 (3+ muscles)- Dry Needling, Patient/Family education, Joint mobilization, Spinal mobilization, Cryotherapy, and Moist heat  PLAN FOR NEXT SESSION: UBE, manual therapy, scapular retraction, upper extremity strengthening, and modalities as needed      Emmarose Klinke, PT 06/02/2024, 11:52 AM

## 2024-06-06 ENCOUNTER — Ambulatory Visit

## 2024-06-09 ENCOUNTER — Ambulatory Visit: Attending: Orthopedic Surgery | Admitting: Physical Therapy

## 2024-06-09 DIAGNOSIS — M79601 Pain in right arm: Secondary | ICD-10-CM | POA: Insufficient documentation

## 2024-06-09 DIAGNOSIS — M62838 Other muscle spasm: Secondary | ICD-10-CM | POA: Insufficient documentation

## 2024-06-09 DIAGNOSIS — M542 Cervicalgia: Secondary | ICD-10-CM | POA: Insufficient documentation

## 2024-06-09 NOTE — Therapy (Signed)
 OUTPATIENT PHYSICAL THERAPY CERVICAL TREATMENT   Patient Name: Brittany Hensley MRN: [id number] DOB:[birth year], 73 y.o., female Today's Date: 06/09/2024  END OF SESSION:  PT End of Session - 06/09/24 1302     Visit Number 15    Number of Visits 16    Date for Recertification  06/16/24    PT Start Time 1145    PT Stop Time 1253    PT Time Calculation (min) 68 min    Activity Tolerance Patient tolerated treatment well    Behavior During Therapy WFL for tasks assessed/performed               Past Medical History:  Diagnosis Date   Anemia    iron     Anxiety    Arthritis    COVID    2020 bad cold s/s lasted 1 week. 04/28/2021   Depression    Diverticulitis    Hx: of   GERD (gastroesophageal reflux disease)    at times for 15 yrs 04/28/2021   History of kidney stones    10 years ago   Hypothyroidism    Low iron     Pneumonia    2020   Rosacea    Hx: of   SCC (squamous cell carcinoma) 08/13/2021   in situ-left temple (CX35FU)   SCC (squamous cell carcinoma) 08/13/2021   well diff-right forehead (CX35FU)   Squamous cell carcinoma of skin 08/03/2021   in situ- left malar cheek (CX35FU)   Past Surgical History:  Procedure Laterality Date   APPENDECTOMY     BALLOON DILATION  06/16/2011   Procedure: BALLOON DILATION;  Surgeon: Oliva FORBES Boots, MD;  Location: [facility] ENDOSCOPY;  Service: Endoscopy;  Laterality: N/A;   BALLOON DILATION N/A 10/03/2013   Procedure: BALLOON DILATION;  Surgeon: Oliva FORBES Boots, MD;  Location: [facility] ENDOSCOPY;  Service: Endoscopy;  Laterality: N/A;   BALLOON DILATION N/A 08/10/2014   Procedure: BALLOON DILATION;  Surgeon: Oliva FORBES Boots, MD;  Location: [facility] ENDOSCOPY;  Service: Endoscopy;  Laterality: N/A;  from 12 - 15 cm dilation completed   BALLOON DILATION N/A 02/15/2015   Procedure: BALLOON DILATION;  Surgeon: Oliva Boots, MD;  Location: [facility] ENDOSCOPY;  Service: Endoscopy;  Laterality: N/A;   BALLOON DILATION N/A 11/12/2015   Procedure: BALLOON  DILATION;  Surgeon: Oliva Boots, MD;  Location: [facility] ENDOSCOPY;  Service: Endoscopy;  Laterality: N/A;   BALLOON DILATION N/A 08/10/2016   Procedure: BALLOON DILATION;  Surgeon: Oliva Boots, MD;  Location: [facility] ENDOSCOPY;  Service: Endoscopy;  Laterality: N/A;   BALLOON DILATION N/A 08/07/2016   Procedure: BALLOON DILATION;  Surgeon: Oliva Boots, MD;  Location: [facility] ENDOSCOPY;  Service: Endoscopy;  Laterality: N/A;   BALLOON DILATION N/A 05/20/2017   Procedure: BALLOON DILATION;  Surgeon: Boots Oliva, MD;  Location: [facility] ENDOSCOPY;  Service: Endoscopy;  Laterality: N/A;   BALLOON DILATION N/A 12/02/2017   Procedure: BALLOON DILATION;  Surgeon: Boots Oliva, MD;  Location: [facility] ENDOSCOPY;  Service: Endoscopy;  Laterality: N/A;   BALLOON DILATION N/A 07/19/2018   Procedure: BALLOON DILATION;  Surgeon: Boots Oliva, MD;  Location: [facility] ENDOSCOPY;  Service: Endoscopy;  Laterality: N/A;   BALLOON DILATION N/A 01/23/2019   Procedure: BALLOON DILATION;  Surgeon: Boots Oliva, MD;  Location: [facility] ENDOSCOPY;  Service: Endoscopy;  Laterality: N/A;   BALLOON DILATION N/A 07/13/2019   Procedure: BALLOON DILATION;  Surgeon: Boots Oliva, MD;  Location: [facility]OS[facility];  Service: Endoscopy;  Laterality: N/A;   BALLOON DILATION N/A 02/02/2020   Procedure: BALLOON  DILATION;  Surgeon: Rosalie Kitchens, MD;  Location: [facility];  Service: Endoscopy;  Laterality: N/A;   BALLOON DILATION N/A 07/16/2020   Procedure: BALLOON DILATION;  Surgeon: Rosalie Kitchens, MD;  Location: [facility] ENDOSCOPY;  Service: Endoscopy;  Laterality: N/A;   BALLOON DILATION N/A 02/19/2021   Procedure: BALLOON DILATION;  Surgeon: Rosalie Kitchens, MD;  Location: [facility]OSCOPY;  Service: Endoscopy;  Laterality: N/A;   BALLOON DILATION N/A 09/30/2021   Procedure: BALLOON DILATION;  Surgeon: Rosalie Kitchens, MD;  Location: [facility] ENDOSCOPY;  Service: Endoscopy;  Laterality: N/A;   BALLOON DILATION N/A 03/19/2022   Procedure: BALLOON DILATION;  Surgeon: Rosalie Kitchens, MD;  Location: [facility]  ENDOSCOPY;  Service: Gastroenterology;  Laterality: N/A;   BALLOON DILATION N/A 05/05/2023   Procedure: BALLOON DILATION;  Surgeon: Rosalie Kitchens, MD;  Location: [facility] ENDOSCOPY;  Service: Gastroenterology;  Laterality: N/A;   CATARACT EXTRACTION, BILATERAL     2019   CHOLECYSTECTOMY OPEN  1979   COLONOSCOPY     Hx: of   CYSTOSCOPY/URETEROSCOPY/HOLMIUM LASER/STENT PLACEMENT Bilateral 05/06/2021   Procedure: CYSTOSCOPY BILATERAL RETROGRADE LEFT URETEROSCOPY/HOLMIUM LASER/STENT PLACEMENT;  Surgeon: Watt Rush, MD;  Location: [facility];  Service: Urology;  Laterality: Bilateral;   ESOPHAGOGASTRODUODENOSCOPY  06/16/2011   Procedure: ESOPHAGOGASTRODUODENOSCOPY (EGD);  Surgeon: Kitchens FORBES Rosalie, MD;  Location: [facility] ENDOSCOPY;  Service: Endoscopy;  Laterality: N/A;   ESOPHAGOGASTRODUODENOSCOPY N/A 10/03/2013   Procedure: ESOPHAGOGASTRODUODENOSCOPY (EGD);  Surgeon: Kitchens FORBES Rosalie, MD;  Location: [facility];  Service: Endoscopy;  Laterality: N/A;   ESOPHAGOGASTRODUODENOSCOPY N/A 08/10/2014   Procedure: ESOPHAGOGASTRODUODENOSCOPY (EGD);  Surgeon: Kitchens FORBES Rosalie, MD;  Location: [facility];  Service: Endoscopy;  Laterality: N/A;   ESOPHAGOGASTRODUODENOSCOPY N/A 08/10/2016   Procedure: ESOPHAGOGASTRODUODENOSCOPY (EGD);  Surgeon: Kitchens Rosalie, MD;  Location: [facility] ENDOSCOPY;  Service: Endoscopy;  Laterality: N/A;   ESOPHAGOGASTRODUODENOSCOPY N/A 07/19/2018   Procedure: ESOPHAGOGASTRODUODENOSCOPY (EGD);  Surgeon: Rosalie Kitchens, MD;  Location: [facility];  Service: Endoscopy;  Laterality: N/A;   ESOPHAGOGASTRODUODENOSCOPY (EGD) WITH PROPOFOL  N/A 02/15/2015   Procedure: ESOPHAGOGASTRODUODENOSCOPY (EGD) WITH PROPOFOL ;  Surgeon: Kitchens Rosalie, MD;  Location: [facility] ENDOSCOPY;  Service: Endoscopy;  Laterality: N/A;   ESOPHAGOGASTRODUODENOSCOPY (EGD) WITH PROPOFOL  N/A 11/12/2015   Procedure: ESOPHAGOGASTRODUODENOSCOPY (EGD) WITH PROPOFOL ;  Surgeon: Kitchens Rosalie, MD;  Location: [facility] ENDOSCOPY;  Service: Endoscopy;   Laterality: N/A;   ESOPHAGOGASTRODUODENOSCOPY (EGD) WITH PROPOFOL  N/A 08/07/2016   Procedure: ESOPHAGOGASTRODUODENOSCOPY (EGD) WITH PROPOFOL ;  Surgeon: Kitchens Rosalie, MD;  Location: [facility] ENDOSCOPY;  Service: Endoscopy;  Laterality: N/A;   ESOPHAGOGASTRODUODENOSCOPY (EGD) WITH PROPOFOL  N/A 05/20/2017   Procedure: ESOPHAGOGASTRODUODENOSCOPY (EGD) WITH PROPOFOL ;  Surgeon: Rosalie Kitchens, MD;  Location: [facility] ENDOSCOPY;  Service: Endoscopy;  Laterality: N/A;   ESOPHAGOGASTRODUODENOSCOPY (EGD) WITH PROPOFOL  N/A 12/02/2017   Procedure: ESOPHAGOGASTRODUODENOSCOPY (EGD) WITH PROPOFOL ;  Surgeon: Rosalie Kitchens, MD;  Location: [facility] ENDOSCOPY;  Service: Endoscopy;  Laterality: N/A;  have c arm available   ESOPHAGOGASTRODUODENOSCOPY (EGD) WITH PROPOFOL  N/A 01/23/2019   Procedure: ESOPHAGOGASTRODUODENOSCOPY (EGD) WITH PROPOFOL ;  Surgeon: Rosalie Kitchens, MD;  Location: [facility] ENDOSCOPY;  Service: Endoscopy;  Laterality: N/A;   ESOPHAGOGASTRODUODENOSCOPY (EGD) WITH PROPOFOL  N/A 07/13/2019   Procedure: ESOPHAGOGASTRODUODENOSCOPY (EGD) WITH PROPOFOL ;  Surgeon: Rosalie Kitchens, MD;  Location: [facility] ENDOSCOPY;  Service: Endoscopy;  Laterality: N/A;   ESOPHAGOGASTRODUODENOSCOPY (EGD) WITH PROPOFOL  N/A 02/02/2020   Procedure: ESOPHAGOGASTRODUODENOSCOPY (EGD) WITH PROPOFOL ;  Surgeon: Rosalie Kitchens, MD;  Location: [facility];  Service: Endoscopy;  Laterality: N/A;   ESOPHAGOGASTRODUODENOSCOPY (EGD) WITH PROPOFOL  N/A 07/16/2020   Procedure: ESOPHAGOGASTRODUODENOSCOPY (EGD) WITH PROPOFOL ;  Surgeon: Rosalie Kitchens, MD;  Location: [facility] ENDOSCOPY;  Service: Endoscopy;  Laterality: N/A;   ESOPHAGOGASTRODUODENOSCOPY (EGD) WITH PROPOFOL  N/A 02/19/2021   Procedure: ESOPHAGOGASTRODUODENOSCOPY (EGD) WITH PROPOFOL ;  Surgeon: Rosalie Kitchens, MD;  Location: [facility] END[facility];  Service: Endoscopy;  Laterality: N/A;  with possible botox    ESOPHAGOGASTRODUODENOSCOPY (EGD) WITH PROPOFOL  N/A 09/30/2021   Procedure: ESOPHAGOGASTRODUODENOSCOPY (EGD) WITH PROPOFOL ;  Surgeon: Rosalie Kitchens, MD;  Location: [facility] END[facility];  Service: Endoscopy;  Laterality: N/A;   ESOPHAGOGASTRODUODENOSCOPY (EGD) WITH PROPOFOL  N/A 03/19/2022   Procedure: ESOPHAGOGASTRODUODENOSCOPY (EGD) WITH PROPOFOL ;  Surgeon: Rosalie Kitchens, MD;  Location: [facility] END[facility];  Service: Gastroenterology;  Laterality: N/A;   ESOPHAGOGASTRODUODENOSCOPY (EGD) WITH PROPOFOL  N/A 05/05/2023   Procedure: ESOPHAGOGASTRODUODENOSCOPY (EGD) WITH PROPOFOL ;  Surgeon: Rosalie Kitchens, MD;  Location: [facility] ENDOSCOPY;  Service: Gastroenterology;  Laterality: N/A;  with balloon dilation   FOREIGN BODY REMOVAL  07/19/2018   Procedure: FOREIGN BODY REMOVAL;  Surgeon: Rosalie Kitchens, MD;  Location: [facility] ENDOSCOPY;  Service: Endoscopy;;   FRACTURE SURGERY     Hx: of left wrist 01/2021   GASTRECTOMY  [phone]   GASTRECTOMY N/A    X3   LITHOTRIPSY     Hx; of for kidney stones [id number]   SAVORY DILATION N/A 08/10/2014   Procedure: SAVORY DILATION;  Surgeon: Kitchens FORBES Rosalie, MD;  Location: [facility] ENDOSCOPY;  Service: Endoscopy;  Laterality: N/A;   TOTAL HIP ARTHROPLASTY Right 12/30/2017   Procedure: RIGHT TOTAL HIP ARTHROPLASTY;  Surgeon: Margrette Taft FORBES, MD;  Location: [facility];  Service: Orthopedics;  Laterality: Right;   TOTAL HIP ARTHROPLASTY Left 02/17/2018   Procedure: TOTAL HIP ARTHROPLASTY;  Surgeon: Margrette Taft FORBES, MD;  Location: [facility];  Service: Orthopedics;  Laterality: Left;   TUBAL LIGATION     1975   Patient Active Problem List   Diagnosis Date Noted   CKD stage 3a, GFR 45-59 ml/min (HCC) 05/16/2024   Stricture of esophagus 08/21/2022   Closed nondisplaced oblique fracture of shaft of ulna with routine healing 03/20/2021   Closed fracture of lower end of right ulna 01/11/2021   Oral phase dysphagia 12/19/2020   Slow transit constipation 12/19/2020   Weight decreased 12/19/2020   Acquired hypertrophic pyloric stenosis 12/19/2020   Osteoporosis without pathological fracture 06/18/2020   Anemia, unspecified 06/27/2018   Age-related  osteoporosis with current pathological fracture of right femur (HCC) 03/01/2018   Status post total hip replacement, left 02/17/18 02/17/2018   Closed displaced fracture of left femoral neck (HCC) 02/14/2018   S/P hip replacement, right 12/30/17 01/13/2018   Closed displaced fracture of right femoral neck (HCC) 12/30/2017   BMI less than 19,adult 01/10/2016   GAD (generalized anxiety disorder) 01/10/2016   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   Hypothyroidism 04/24/2013   GERD (gastroesophageal reflux disease) 04/24/2013   Rosacea 04/24/2013   Iron  deficiency anemia due to chronic blood loss 06/09/2012   Gastric outlet obstruction 06/16/2011   Atrioventricular nodal re-entry tachycardia (HCC) 03/11/2010   MURMUR 03/11/2010   PERSONAL HISTORY OF URINARY CALCULI 02/14/2008   REFERRING PROVIDER: Georgina Ozell LABOR, MD   REFERRING DIAG: Cervicalgia   THERAPY DIAG:  Cervicalgia  Other muscle spasm  Pain in right arm  Rationale for Evaluation and Treatment: Rehabilitation  ONSET DATE: 6-7 months  SUBJECTIVE:  SUBJECTIVE STATEMENT: Pain down to a 1 to 1.5 .    PERTINENT HISTORY:  Osteoporosis, anxiety, arthritis,history of COVID-19, depression, and allergies  PAIN:  Are you having pain? Yes: NPRS scale: 3/10 Pain location: Bil neck   PRECAUTIONS: None  RED FLAGS: None    WEIGHT BEARING RESTRICTIONS: No  FALLS:  Has patient fallen in last 6 months? No  LIVING ENVIRONMENT: Lives with: lives alone Lives in: House/apartment  OCCUPATION: retired   PLOF: Independent  PATIENT GOALS: reduced pain and improved mobility  NEXT MD VISIT: only as needed  OBJECTIVE:  Note: Objective measures were completed at Evaluation unless otherwise noted.  DIAGNOSTIC FINDINGS: 03/22/24  cervical x-ray grade 1 spondylolisthesis at C5/6.  No evidence of  instability on flexion/extension views.  Disc height loss with anterior osteophyte formation at C5/6 and C6/7.  No fracture or dislocation seen.   PATIENT SURVEYS:  NDI:  NECK DISABILITY INDEX  Date: 03/29/24 Score  Pain intensity 1 = The pain is very mild at the moment  2. Personal care (washing, dressing, etc.) 0 = I can look after myself normally without causing extra pain  3. Lifting 1 =  I can lift heavy weights but it gives extra pain  4. Reading 1 = I can read as much as I want to with slight pain in my neck  5. Headaches 0 = I have no headaches at all  6. Concentration 0 =  I can concentrate fully when I want to with no difficulty  7. Work 1 =  I can only do my usual work, but no more  8. Driving 0 = I can drive my car without any neck pain  9. Sleeping 0 = I have no trouble sleeping  10. Recreation 0 = I am able to engage in all my recreation activities with no neck pain at all  Total 4/50   Minimum Detectable Change (90% confidence): 5 points or 10% points  COGNITION: Overall cognitive status: Within functional limits for tasks assessed  SENSATION: Patient reports no numbness or tingling  POSTURE: rounded shoulders and forward head  PALPATION: TTP: bilateral scapular stabilizers, right upper trapezius, levator scapulae, and cervical paraspinals   CERVICAL ROM: significant difficulty isolating cervical rotation both directions  Active ROM A/PROM (deg) eval  Flexion 48  Extension 42; sore  Right lateral flexion 32; tension   Left lateral flexion 36  Right rotation 40  Left rotation 45   (Blank rows = not tested)  UPPER EXTREMITY ROM: WFL for activities assessed  UPPER EXTREMITY MMT:  MMT Right eval Left eval  Shoulder flexion 4/5 4/5  Shoulder extension    Shoulder abduction 4/5 4/5  Shoulder adduction    Shoulder extension    Shoulder internal rotation    Shoulder external rotation     Middle trapezius    Lower trapezius    Elbow flexion 4/5 4+/5  Elbow extension 3+/5 4/5  Wrist flexion    Wrist extension    Wrist ulnar deviation    Wrist radial deviation    Wrist pronation    Wrist supination    Grip strength 15 18   (Blank rows = not tested)  CERVICAL SPECIAL TESTS:  Spurling's test: Negative and Sharp pursor's test: Negative  TREATMENT DATE:    06/09/24:  UBE x 8 minutes  f/b RW4 with red theraband and bicep curls and tricep ext all performed to fatigue f/b STW/M x 23 minutes to patient's right cervical and over the length  of her right UE to forearm f/b HMP and IFC at 40% scan x 20 minutes.    06/02/24:   UBE x 8 minutes f/b STW/M x 15 minutes to patient's right cervical and over the length of her right UE to forearm f/b HMP and IFC at 100% scan x 20 minutes.     05/26/24:  UBE x 8 minutes f/b RW4 with yellow theraband f/b STW/M x 25 minutes to patient's right cervical and over the length of her right UE to forearm f/b HMP and IFC at 100% scan x 20 minutes.                                                                                                                        PATIENT EDUCATION:  Education details: plan for physical therapy, taking medications as prescribed Person educated: Patient Education method: Explanation Education comprehension: verbalized understanding  HOME EXERCISE PROGRAM:   Clinical impression statement: Patient very pleased with her overall progress. She progressed to red theraband and added bicep curls and tricep extension.  Red theraband provided for home use.  No pain reported following treatment. Plan discharge next visit.    OBJECTIVE IMPAIRMENTS: decreased mobility, decreased ROM, decreased strength, hypomobility, impaired tone, impaired UE functional use, postural dysfunction, and pain.   ACTIVITY LIMITATIONS: lifting and sleeping  PARTICIPATION LIMITATIONS: meal prep, cleaning, and laundry  PERSONAL FACTORS:  Past/current experiences, Time since onset of injury/illness/exacerbation, and 3+ comorbidities: Osteoporosis, anxiety, arthritis,history of COVID-19, depression, and allergies are also affecting patient's functional outcome.   REHAB POTENTIAL: Good  CLINICAL DECISION MAKING: Evolving/moderate complexity  EVALUATION COMPLEXITY: Moderate   GOALS: Goals reviewed with patient? Yes  LONG TERM GOALS: Target date: 04/26/24  Patient will be independent with her HEP. Baseline:  Goal status: Partially met.  2.  Patient will be able to demonstrate at least 50 degrees of cervical rotation bilaterally for improved awareness of her surroundings. Baseline: Bilateral 45 degrees. Goal status: Partially met.  3.  Patient will improve her right triceps strength to at least 4/5 for improved function reaching and picking up items at home. Baseline: MET   PLAN:  PT FREQUENCY: 1-2x/week  PT DURATION: 4 weeks  PLANNED INTERVENTIONS: 97164- PT Re-evaluation, 97750- Physical Performance Testing, 97110-Therapeutic exercises, 97530- Therapeutic activity, [id number]- Neuromuscular re-education, 97535- Self Care, 02859- Manual therapy, [id number]- Electrical stimulation (unattended), 02987- Traction (mechanical), 20560 (1-2 muscles), 20561 (3+ muscles)- Dry Needling, Patient/Family education, Joint mobilization, Spinal mobilization, Cryotherapy, and Moist heat  PLAN FOR NEXT SESSION: UBE, manual therapy, scapular retraction, upper extremity strengthening, and modalities as needed      Ettel Albergo, PT 06/09/2024, 1:10 PM

## 2024-06-16 ENCOUNTER — Other Ambulatory Visit: Payer: Self-pay

## 2024-06-16 ENCOUNTER — Ambulatory Visit: Admitting: Physical Therapy

## 2024-06-16 ENCOUNTER — Ambulatory Visit: Payer: PPO | Admitting: Oncology

## 2024-06-16 ENCOUNTER — Other Ambulatory Visit: Payer: PPO

## 2024-06-16 DIAGNOSIS — M79601 Pain in right arm: Secondary | ICD-10-CM

## 2024-06-16 DIAGNOSIS — M542 Cervicalgia: Secondary | ICD-10-CM

## 2024-06-16 DIAGNOSIS — M62838 Other muscle spasm: Secondary | ICD-10-CM

## 2024-06-16 NOTE — Therapy (Signed)
 OUTPATIENT PHYSICAL THERAPY CERVICAL TREATMENT   Patient Name: Brittany Hensley MRN: [id number] DOB:[birth year], 73 y.o., female Today's Date: 06/16/2024  END OF SESSION:  PT End of Session - 06/16/24 1316     Visit Number 16    Number of Visits 16    Date for Recertification  06/16/24    PT Start Time 1100    PT Stop Time 1153    PT Time Calculation (min) 53 min    Activity Tolerance Patient tolerated treatment well    Behavior During Therapy WFL for tasks assessed/performed                Past Medical History:  Diagnosis Date   Anemia    iron     Anxiety    Arthritis    COVID    2020 bad cold s/s lasted 1 week. 04/28/2021   Depression    Diverticulitis    Hx: of   GERD (gastroesophageal reflux disease)    at times for 15 yrs 04/28/2021   History of kidney stones    10 years ago   Hypothyroidism    Low iron     Pneumonia    2020   Rosacea    Hx: of   SCC (squamous cell carcinoma) 08/13/2021   in situ-left temple (CX35FU)   SCC (squamous cell carcinoma) 08/13/2021   well diff-right forehead (CX35FU)   Squamous cell carcinoma of skin 08/03/2021   in situ- left malar cheek (CX35FU)   Past Surgical History:  Procedure Laterality Date   APPENDECTOMY     BALLOON DILATION  06/16/2011   Procedure: BALLOON DILATION;  Surgeon: Oliva FORBES Boots, MD;  Location: [facility] ENDOSCOPY;  Service: Endoscopy;  Laterality: N/A;   BALLOON DILATION N/A 10/03/2013   Procedure: BALLOON DILATION;  Surgeon: Oliva FORBES Boots, MD;  Location: [facility] ENDOSCOPY;  Service: Endoscopy;  Laterality: N/A;   BALLOON DILATION N/A 08/10/2014   Procedure: BALLOON DILATION;  Surgeon: Oliva FORBES Boots, MD;  Location: [facility] ENDOSCOPY;  Service: Endoscopy;  Laterality: N/A;  from 12 - 15 cm dilation completed   BALLOON DILATION N/A 02/15/2015   Procedure: BALLOON DILATION;  Surgeon: Oliva Boots, MD;  Location: [facility] ENDOSCOPY;  Service: Endoscopy;  Laterality: N/A;   BALLOON DILATION N/A 11/12/2015   Procedure: BALLOON  DILATION;  Surgeon: Oliva Boots, MD;  Location: [facility] ENDOSCOPY;  Service: Endoscopy;  Laterality: N/A;   BALLOON DILATION N/A 08/10/2016   Procedure: BALLOON DILATION;  Surgeon: Oliva Boots, MD;  Location: [facility] ENDOSCOPY;  Service: Endoscopy;  Laterality: N/A;   BALLOON DILATION N/A 08/07/2016   Procedure: BALLOON DILATION;  Surgeon: Oliva Boots, MD;  Location: [facility] ENDOSCOPY;  Service: Endoscopy;  Laterality: N/A;   BALLOON DILATION N/A 05/20/2017   Procedure: BALLOON DILATION;  Surgeon: Boots Oliva, MD;  Location: [facility] ENDOSCOPY;  Service: Endoscopy;  Laterality: N/A;   BALLOON DILATION N/A 12/02/2017   Procedure: BALLOON DILATION;  Surgeon: Boots Oliva, MD;  Location: [facility] ENDOSCOPY;  Service: Endoscopy;  Laterality: N/A;   BALLOON DILATION N/A 07/19/2018   Procedure: BALLOON DILATION;  Surgeon: Boots Oliva, MD;  Location: [facility] ENDOSCOPY;  Service: Endoscopy;  Laterality: N/A;   BALLOON DILATION N/A 01/23/2019   Procedure: BALLOON DILATION;  Surgeon: Boots Oliva, MD;  Location: [facility] ENDOSCOPY;  Service: Endoscopy;  Laterality: N/A;   BALLOON DILATION N/A 07/13/2019   Procedure: BALLOON DILATION;  Surgeon: Boots Oliva, MD;  Location: [facility] ENDOSCOPY;  Service: Endoscopy;  Laterality: N/A;   BALLOON DILATION N/A 02/02/2020   Procedure:  BALLOON DILATION;  Surgeon: Rosalie Kitchens, MD;  Location: [facility]OSCOPY;  Service: Endoscopy;  Laterality: N/A;   BALLOON DILATION N/A 07/16/2020   Procedure: BALLOON DILATION;  Surgeon: Rosalie Kitchens, MD;  Location: [facility] ENDOSCOPY;  Service: Endoscopy;  Laterality: N/A;   BALLOON DILATION N/A 02/19/2021   Procedure: BALLOON DILATION;  Surgeon: Rosalie Kitchens, MD;  Location: [facility] ENDOSCOPY;  Service: Endoscopy;  Laterality: N/A;   BALLOON DILATION N/A 09/30/2021   Procedure: BALLOON DILATION;  Surgeon: Rosalie Kitchens, MD;  Location: [facility] ENDOSCOPY;  Service: Endoscopy;  Laterality: N/A;   BALLOON DILATION N/A 03/19/2022   Procedure: BALLOON DILATION;  Surgeon: Rosalie Kitchens, MD;  Location: [facility]  ENDOSCOPY;  Service: Gastroenterology;  Laterality: N/A;   BALLOON DILATION N/A 05/05/2023   Procedure: BALLOON DILATION;  Surgeon: Rosalie Kitchens, MD;  Location: [facility] ENDOSCOPY;  Service: Gastroenterology;  Laterality: N/A;   CATARACT EXTRACTION, BILATERAL     2019   CHOLECYSTECTOMY OPEN  1979   COLONOSCOPY     Hx: of   CYSTOSCOPY/URETEROSCOPY/HOLMIUM LASER/STENT PLACEMENT Bilateral 05/06/2021   Procedure: CYSTOSCOPY BILATERAL RETROGRADE LEFT URETEROSCOPY/HOLMIUM LASER/STENT PLACEMENT;  Surgeon: Watt Rush, MD;  Location: [facility];  Service: Urology;  Laterality: Bilateral;   ESOPHAGOGASTRODUODENOSCOPY  06/16/2011   Procedure: ESOPHAGOGASTRODUODENOSCOPY (EGD);  Surgeon: Kitchens FORBES Rosalie, MD;  Location: [facility] ENDOSCOPY;  Service: Endoscopy;  Laterality: N/A;   ESOPHAGOGASTRODUODENOSCOPY N/A 10/03/2013   Procedure: ESOPHAGOGASTRODUODENOSCOPY (EGD);  Surgeon: Kitchens FORBES Rosalie, MD;  Location: [facility]COPY;  Service: Endoscopy;  Laterality: N/A;   ESOPHAGOGASTRODUODENOSCOPY N/A 08/10/2014   Procedure: ESOPHAGOGASTRODUODENOSCOPY (EGD);  Surgeon: Kitchens FORBES Rosalie, MD;  Location: [facility];  Service: Endoscopy;  Laterality: N/A;   ESOPHAGOGASTRODUODENOSCOPY N/A 08/10/2016   Procedure: ESOPHAGOGASTRODUODENOSCOPY (EGD);  Surgeon: Kitchens Rosalie, MD;  Location: [facility] ENDOSCOPY;  Service: Endoscopy;  Laterality: N/A;   ESOPHAGOGASTRODUODENOSCOPY N/A 07/19/2018   Procedure: ESOPHAGOGASTRODUODENOSCOPY (EGD);  Surgeon: Rosalie Kitchens, MD;  Location: [facility];  Service: Endoscopy;  Laterality: N/A;   ESOPHAGOGASTRODUODENOSCOPY (EGD) WITH PROPOFOL  N/A 02/15/2015   Procedure: ESOPHAGOGASTRODUODENOSCOPY (EGD) WITH PROPOFOL ;  Surgeon: Kitchens Rosalie, MD;  Location: [facility] ENDOSCOPY;  Service: Endoscopy;  Laterality: N/A;   ESOPHAGOGASTRODUODENOSCOPY (EGD) WITH PROPOFOL  N/A 11/12/2015   Procedure: ESOPHAGOGASTRODUODENOSCOPY (EGD) WITH PROPOFOL ;  Surgeon: Kitchens Rosalie, MD;  Location: [facility] ENDOSCOPY;  Service: Endoscopy;   Laterality: N/A;   ESOPHAGOGASTRODUODENOSCOPY (EGD) WITH PROPOFOL  N/A 08/07/2016   Procedure: ESOPHAGOGASTRODUODENOSCOPY (EGD) WITH PROPOFOL ;  Surgeon: Kitchens Rosalie, MD;  Location: [facility] ENDOSCOPY;  Service: Endoscopy;  Laterality: N/A;   ESOPHAGOGASTRODUODENOSCOPY (EGD) WITH PROPOFOL  N/A 05/20/2017   Procedure: ESOPHAGOGASTRODUODENOSCOPY (EGD) WITH PROPOFOL ;  Surgeon: Rosalie Kitchens, MD;  Location: [facility] ENDOSCOPY;  Service: Endoscopy;  Laterality: N/A;   ESOPHAGOGASTRODUODENOSCOPY (EGD) WITH PROPOFOL  N/A 12/02/2017   Procedure: ESOPHAGOGASTRODUODENOSCOPY (EGD) WITH PROPOFOL ;  Surgeon: Rosalie Kitchens, MD;  Location: [facility] ENDOSCOPY;  Service: Endoscopy;  Laterality: N/A;  have c arm available   ESOPHAGOGASTRODUODENOSCOPY (EGD) WITH PROPOFOL  N/A 01/23/2019   Procedure: ESOPHAGOGASTRODUODENOSCOPY (EGD) WITH PROPOFOL ;  Surgeon: Rosalie Kitchens, MD;  Location: [facility] ENDOSCOPY;  Service: Endoscopy;  Laterality: N/A;   ESOPHAGOGASTRODUODENOSCOPY (EGD) WITH PROPOFOL  N/A 07/13/2019   Procedure: ESOPHAGOGASTRODUODENOSCOPY (EGD) WITH PROPOFOL ;  Surgeon: Rosalie Kitchens, MD;  Location: [facility]OSCOPY;  Service: Endoscopy;  Laterality: N/A;   ESOPHAGOGASTRODUODENOSCOPY (EGD) WITH PROPOFOL  N/A 02/02/2020   Procedure: ESOPHAGOGASTRODUODENOSCOPY (EGD) WITH PROPOFOL ;  Surgeon: Rosalie Kitchens, MD;  Location: [facility] ENDOSCOPY;  Service: Endoscopy;  Laterality: N/A;   ESOPHAGOGASTRODUODENOSCOPY (EGD) WITH PROPOFOL  N/A 07/16/2020   Procedure: ESOPHAGOGASTRODUODENOSCOPY (EGD) WITH PROPOFOL ;  Surgeon: Rosalie Kitchens,  MD;  Location: [facility] ENDOSCOPY;  Service: Endoscopy;  Laterality: N/A;   ESOPHAGOGASTRODUODENOSCOPY (EGD) WITH PROPOFOL  N/A 02/19/2021   Procedure: ESOPHAGOGASTRODUODENOSCOPY (EGD) WITH PROPOFOL ;  Surgeon: Rosalie Kitchens, MD;  Location: [facility] ENDOS[facility];  Service: Endoscopy;  Laterality: N/A;  with possible botox    ESOPHAGOGASTRODUODENOSCOPY (EGD) WITH PROPOFOL  N/A 09/30/2021   Procedure: ESOPHAGOGASTRODUODENOSCOPY (EGD) WITH PROPOFOL ;  Surgeon: Rosalie Kitchens, MD;  Location: [facility];  Service: Endoscopy;  Laterality: N/A;   ESOPHAGOGASTRODUODENOSCOPY (EGD) WITH PROPOFOL  N/A 03/19/2022   Procedure: ESOPHAGOGASTRODUODENOSCOPY (EGD) WITH PROPOFOL ;  Surgeon: Rosalie Kitchens, MD;  Location: [facility] ENDOSCOPY;  Service: Gastroenterology;  Laterality: N/A;   ESOPHAGOGASTRODUODENOSCOPY (EGD) WITH PROPOFOL  N/A 05/05/2023   Procedure: ESOPHAGOGASTRODUODENOSCOPY (EGD) WITH PROPOFOL ;  Surgeon: Rosalie Kitchens, MD;  Location: [facility] ENDOSCOPY;  Service: Gastroenterology;  Laterality: N/A;  with balloon dilation   FOREIGN BODY REMOVAL  07/19/2018   Procedure: FOREIGN BODY REMOVAL;  Surgeon: Rosalie Kitchens, MD;  Location: [facility] ENDOSCOPY;  Service: Endoscopy;;   FRACTURE SURGERY     Hx: of left wrist 01/2021   GASTRECTOMY  [phone]   GASTRECTOMY N/A    X3   LITHOTRIPSY     Hx; of for kidney stones [id number]   SAVORY DILATION N/A 08/10/2014   Procedure: SAVORY DILATION;  Surgeon: Kitchens FORBES Rosalie, MD;  Location: [facility] ENDOSCOPY;  Service: Endoscopy;  Laterality: N/A;   TOTAL HIP ARTHROPLASTY Right 12/30/2017   Procedure: RIGHT TOTAL HIP ARTHROPLASTY;  Surgeon: Margrette Taft FORBES, MD;  Location: [facility];  Service: Orthopedics;  Laterality: Right;   TOTAL HIP ARTHROPLASTY Left 02/17/2018   Procedure: TOTAL HIP ARTHROPLASTY;  Surgeon: Margrette Taft FORBES, MD;  Location: [facility];  Service: Orthopedics;  Laterality: Left;   TUBAL LIGATION     1975   Patient Active Problem List   Diagnosis Date Noted   CKD stage 3a, GFR 45-59 ml/min (HCC) 05/16/2024   Stricture of esophagus 08/21/2022   Closed nondisplaced oblique fracture of shaft of ulna with routine healing 03/20/2021   Closed fracture of lower end of right ulna 01/11/2021   Oral phase dysphagia 12/19/2020   Slow transit constipation 12/19/2020   Weight decreased 12/19/2020   Acquired hypertrophic pyloric stenosis 12/19/2020   Osteoporosis without pathological fracture 06/18/2020   Anemia, unspecified 06/27/2018   Age-related  osteoporosis with current pathological fracture of right femur (HCC) 03/01/2018   Status post total hip replacement, left 02/17/18 02/17/2018   Closed displaced fracture of left femoral neck (HCC) 02/14/2018   S/P hip replacement, right 12/30/17 01/13/2018   Closed displaced fracture of right femoral neck (HCC) 12/30/2017   BMI less than 19,adult 01/10/2016   GAD (generalized anxiety disorder) 01/10/2016   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   Hypothyroidism 04/24/2013   GERD (gastroesophageal reflux disease) 04/24/2013   Rosacea 04/24/2013   Iron  deficiency anemia due to chronic blood loss 06/09/2012   Gastric outlet obstruction 06/16/2011   Atrioventricular nodal re-entry tachycardia (HCC) 03/11/2010   MURMUR 03/11/2010   PERSONAL HISTORY OF URINARY CALCULI 02/14/2008   REFERRING PROVIDER: Georgina Ozell LABOR, MD   REFERRING DIAG: Cervicalgia   THERAPY DIAG:  Cervicalgia  Other muscle spasm  Pain in right arm  Rationale for Evaluation and Treatment: Rehabilitation  ONSET DATE: 6-7 months  SUBJECTIVE:  SUBJECTIVE STATEMENT: Pain down to 1.5 .    PERTINENT HISTORY:  Osteoporosis, anxiety, arthritis,history of COVID-19, depression, and allergies  PAIN:  Are you having pain? Yes: NPRS scale: 1.5/10 Pain location: Bil neck   PRECAUTIONS: None  RED FLAGS: None    WEIGHT BEARING RESTRICTIONS: No  FALLS:  Has patient fallen in last 6 months? No  LIVING ENVIRONMENT: Lives with: lives alone Lives in: House/apartment  OCCUPATION: retired   PLOF: Independent  PATIENT GOALS: reduced pain and improved mobility  NEXT MD VISIT: only as needed  OBJECTIVE:  Note: Objective measures were completed at Evaluation unless otherwise noted.  DIAGNOSTIC FINDINGS: 03/22/24  cervical x-ray grade 1 spondylolisthesis at C5/6.  No evidence of  instability on flexion/extension views.  Disc height loss with anterior osteophyte formation at C5/6 and C6/7.  No fracture or dislocation seen.   PATIENT SURVEYS:  NDI:  NECK DISABILITY INDEX  Date: 03/29/24 Score  Pain intensity 1 = The pain is very mild at the moment  2. Personal care (washing, dressing, etc.) 0 = I can look after myself normally without causing extra pain  3. Lifting 1 =  I can lift heavy weights but it gives extra pain  4. Reading 1 = I can read as much as I want to with slight pain in my neck  5. Headaches 0 = I have no headaches at all  6. Concentration 0 =  I can concentrate fully when I want to with no difficulty  7. Work 1 =  I can only do my usual work, but no more  8. Driving 0 = I can drive my car without any neck pain  9. Sleeping 0 = I have no trouble sleeping  10. Recreation 0 = I am able to engage in all my recreation activities with no neck pain at all  Total 4/50   Minimum Detectable Change (90% confidence): 5 points or 10% points  COGNITION: Overall cognitive status: Within functional limits for tasks assessed  SENSATION: Patient reports no numbness or tingling  POSTURE: rounded shoulders and forward head  PALPATION: TTP: bilateral scapular stabilizers, right upper trapezius, levator scapulae, and cervical paraspinals   CERVICAL ROM: significant difficulty isolating cervical rotation both directions  Active ROM A/PROM (deg) eval  Flexion 48  Extension 42; sore  Right lateral flexion 32; tension   Left lateral flexion 36  Right rotation 40  Left rotation 45   (Blank rows = not tested)  UPPER EXTREMITY ROM: WFL for activities assessed  UPPER EXTREMITY MMT:  MMT Right eval Left eval  Shoulder flexion 4/5 4/5  Shoulder extension    Shoulder abduction 4/5 4/5  Shoulder adduction    Shoulder extension    Shoulder internal rotation    Shoulder external rotation     Middle trapezius    Lower trapezius    Elbow flexion 4/5 4+/5  Elbow extension 3+/5 4/5  Wrist flexion    Wrist extension    Wrist ulnar deviation    Wrist radial deviation    Wrist pronation    Wrist supination    Grip strength 15 18   (Blank rows = not tested)  CERVICAL SPECIAL TESTS:  Spurling's test: Negative and Sharp pursor's test: Negative  TREATMENT DATE:    06/16/24:  UBE x 10 minutes f/b STW/M x 15 minutes to patient's right cervical and over the length of her right UE to forearm f/b HMP and IFC at 40% scan x 20 minutes.  Normal modality  resposne following removal of modality.   06/09/24:  UBE x 8 minutes  f/b RW4 with red theraband and bicep curls and tricep ext all performed to fatigue f/b STW/M x 23 minutes to patient's right cervical and over the length of her right UE to forearm f/b HMP and IFC at 40% scan x 20 minutes.    06/02/24:   UBE x 8 minutes f/b STW/M x 15 minutes to patient's right cervical and over the length of her right UE to forearm f/b HMP and IFC at 100% scan x 20 minutes.                                                                                                                     PATIENT EDUCATION:  Education details: plan for physical therapy, taking medications as prescribed Person educated: Patient Education method: Explanation Education comprehension: verbalized understanding  HOME EXERCISE PROGRAM:   Clinical impression statement: See below.  OBJECTIVE IMPAIRMENTS: decreased mobility, decreased ROM, decreased strength, hypomobility, impaired tone, impaired UE functional use, postural dysfunction, and pain.   ACTIVITY LIMITATIONS: lifting and sleeping  PARTICIPATION LIMITATIONS: meal prep, cleaning, and laundry  PERSONAL FACTORS: Past/current experiences, Time since onset of injury/illness/exacerbation, and 3+ comorbidities: Osteoporosis, anxiety, arthritis,history of COVID-19, depression, and allergies are also affecting patient's  functional outcome.   REHAB POTENTIAL: Good  CLINICAL DECISION MAKING: Evolving/moderate complexity  EVALUATION COMPLEXITY: Moderate   GOALS: Goals reviewed with patient? Yes  LONG TERM GOALS: Target date: 04/26/24  Patient will be independent with her HEP. Baseline:  Goal status: Met.  2.  Patient will be able to demonstrate at least 50 degrees of cervical rotation bilaterally for improved awareness of her surroundings. Baseline: Bilateral RT 50 degrees LT 45 degrees (06/16/24).  Goal status: Partially met.  3.  Patient will improve her right triceps strength to at least 4/5 for improved function reaching and picking up items at home. Baseline: MET   PLAN:  PT FREQUENCY: 1-2x/week  PT DURATION: 4 weeks  PLANNED INTERVENTIONS: 02835- PT Re-evaluation, 97750- Physical Performance Testing, 97110-Therapeutic exercises, 97530- Therapeutic activity, [id number]- Neuromuscular re-education, 97535- Self Care, 02859- Manual therapy, G0283- Electrical stimulation (unattended), [phone]- Traction (mechanical), 20560 (1-2 muscles), 20561 (3+ muscles)- Dry Needling, Patient/Family education, Joint mobilization, Spinal mobilization, Cryotherapy, and Moist heat  PLAN FOR NEXT SESSION: UBE, manual therapy, scapular retraction, upper extremity strengthening, and modalities as needed   PHYSICAL THERAPY DISCHARGE SUMMARY  Visits from Start of Care: 16.  Current functional level related to goals / functional outcomes: See above.     Remaining deficits: Pain has been consistently very low.  All goals met but active left cervical rotation.   Education / Equipment: HEP.   Patient agrees to discharge. Patient goals were partially met. Patient is being discharged due to being pleased with the current functional level.    Ruvim Risko, PT 06/16/2024, 1:17 PM

## 2024-06-28 ENCOUNTER — Other Ambulatory Visit: Payer: Self-pay | Admitting: *Deleted

## 2024-06-28 DIAGNOSIS — D500 Iron deficiency anemia secondary to blood loss (chronic): Secondary | ICD-10-CM

## 2024-07-06 ENCOUNTER — Inpatient Hospital Stay: Admitting: Oncology

## 2024-07-06 ENCOUNTER — Other Ambulatory Visit

## 2024-07-06 ENCOUNTER — Inpatient Hospital Stay: Attending: Oncology

## 2024-07-06 ENCOUNTER — Ambulatory Visit: Admitting: Oncology

## 2024-07-06 VITALS — BP 115/61 | HR 72 | Temp 97.8°F | Resp 18 | Ht 65.0 in | Wt 104.9 lb

## 2024-07-06 DIAGNOSIS — D500 Iron deficiency anemia secondary to blood loss (chronic): Secondary | ICD-10-CM | POA: Insufficient documentation

## 2024-07-06 DIAGNOSIS — K922 Gastrointestinal hemorrhage, unspecified: Secondary | ICD-10-CM | POA: Insufficient documentation

## 2024-07-06 DIAGNOSIS — M810 Age-related osteoporosis without current pathological fracture: Secondary | ICD-10-CM | POA: Insufficient documentation

## 2024-07-06 LAB — CBC WITH DIFFERENTIAL (CANCER CENTER ONLY)
Abs Immature Granulocytes: 0.04 K/uL (ref 0.00–0.07)
Basophils Absolute: 0 K/uL (ref 0.0–0.1)
Basophils Relative: 1 %
Eosinophils Absolute: 0.1 K/uL (ref 0.0–0.5)
Eosinophils Relative: 2 %
HCT: 40.7 % (ref 36.0–46.0)
Hemoglobin: 13.1 g/dL (ref 12.0–15.0)
Immature Granulocytes: 1 %
Lymphocytes Relative: 34 %
Lymphs Abs: 1.8 K/uL (ref 0.7–4.0)
MCH: 31.9 pg (ref 26.0–34.0)
MCHC: 32.2 g/dL (ref 30.0–36.0)
MCV: 99 fL (ref 80.0–100.0)
Monocytes Absolute: 0.5 K/uL (ref 0.1–1.0)
Monocytes Relative: 10 %
Neutro Abs: 2.8 K/uL (ref 1.7–7.7)
Neutrophils Relative %: 52 %
Platelet Count: 212 K/uL (ref 150–400)
RBC: 4.11 MIL/uL (ref 3.87–5.11)
RDW: 13.8 % (ref 11.5–15.5)
WBC Count: 5.3 K/uL (ref 4.0–10.5)
nRBC: 0 % (ref 0.0–0.2)

## 2024-07-06 LAB — FERRITIN: Ferritin: 112 ng/mL (ref 11–307)

## 2024-07-06 LAB — BASIC METABOLIC PANEL - CANCER CENTER ONLY
Anion gap: 9 (ref 5–15)
BUN: 36 mg/dL — ABNORMAL HIGH (ref 8–23)
CO2: 24 mmol/L (ref 22–32)
Calcium: 9.5 mg/dL (ref 8.9–10.3)
Chloride: 108 mmol/L (ref 98–111)
Creatinine: 1.17 mg/dL — ABNORMAL HIGH (ref 0.44–1.00)
GFR, Estimated: 49 mL/min — ABNORMAL LOW (ref >60)
Glucose, Bld: 117 mg/dL — ABNORMAL HIGH (ref 70–99)
Potassium: 4 mmol/L (ref 3.5–5.1)
Sodium: 141 mmol/L (ref 135–145)

## 2024-07-06 NOTE — Progress Notes (Signed)
  [facility] OFFICE PROGRESS NOTE   Diagnosis: Iron  deficiency anemia  INTERVAL HISTORY:   Brittany Hensley turns as scheduled.  She feels well.  She underwent dilation of the Billroth anastomosis by Dr. Rosalie on 04/28/2024.  Dysphagia improved.  She reports recent right arm pain and weakness.  She was diagnosed with cervical spine disease.  The symptoms improved with physical therapy.  No bleeding.  Objective:  Vital signs in last 24 hours:  Blood pressure 115/61, pulse 72, temperature 97.8 F (36.6 C), temperature source Temporal, resp. rate 18, height 5' 5 (1.651 m), weight 104 lb 14.4 oz (47.6 kg), SpO2 100%.    Resp: Lungs Cardio: Regular rate and rhythm GI: Nontender, no hepatomegaly Vascular: No leg edema  Lab Results:  Lab Results  Component Value Date   WBC 5.3 07/06/2024   HGB 13.1 07/06/2024   HCT 40.7 07/06/2024   MCV 99.0 07/06/2024   PLT 212 07/06/2024   NEUTROABS 2.8 07/06/2024    CMP  Lab Results  Component Value Date   NA 141 07/06/2024   K 4.0 07/06/2024   CL 108 07/06/2024   CO2 24 07/06/2024   GLUCOSE 117 (H) 07/06/2024   BUN 36 (H) 07/06/2024   CREATININE 1.17 (H) 07/06/2024   CALCIUM  9.5 07/06/2024   PROT 6.0 05/15/2024   ALBUMIN 4.0 05/15/2024   AST 29 05/15/2024   ALT 26 05/15/2024   ALKPHOS 81 05/15/2024   BILITOT <0.2 05/15/2024   GFRNONAA 49 (L) 07/06/2024   GFRAA 63 05/03/2020    Medications: I have reviewed the patient's current medications.   Assessment/Plan: Chronic anemia secondary to gastrointestinal blood loss and iron  deficiency, she last received IV iron  for 3 weekly treatments beginning 04/01/2023 History of kidney stones, followed by Dr. Brunetta. Gram-negative sepsis and septic shock syndrome April 2008. History of acute renal failure secondary to obstruction and septic shock. History of adrenal insufficiency, no longer on hormone replacement.  Questionable allergic to Venofer  in June 2010. She reported an  elevated blood pressure and dizziness on the day following the Venofer  therapy. She has tolerated iron  dextran without an apparent reaction. History of a left forearm fracture.   Intermittent low-serum bicarbonate level, ? related to diarrhea or renal insufficiency Dysphagia, improved with erythromycin  and esophageal dilatation procedures , status post dilatation of the gastric jejunal anastomosis 08/10/2014,02/15/2015, 11/12/2015, 08/10/2016, 05/20/2017, 04/28/2024 Chest x-ray 05/21/2017-COPD changes with questionable nodular density inferior left chest. CT chest scheduled 06/07/2017. Right hip arthroplasty May 2019, left hip arthroplasty July 2019 COVID-19 infection January 2021 Osteoporosis Removal of dentate line polyp and banding of internal hemorrhoids 01/27/2023     Disposition: Ms. Senat appears stable.  The hemoglobin and ferritin levels are normal.  She will return for labs in 3 months and 6 months.  She will be scheduled for an office and lab visit in 9 months.  She will call for symptoms of anemia.  She last received IV iron  in September 2024.  Arley Hof, MD  07/06/2024  11:56 AM

## 2024-07-25 ENCOUNTER — Ambulatory Visit: Payer: Self-pay

## 2024-08-10 ENCOUNTER — Ambulatory Visit (INDEPENDENT_AMBULATORY_CARE_PROVIDER_SITE_OTHER)

## 2024-08-10 VITALS — BP 115/61 | HR 72 | Ht <= 58 in | Wt 104.0 lb

## 2024-08-10 DIAGNOSIS — Z0000 Encounter for general adult medical examination without abnormal findings: Secondary | ICD-10-CM

## 2024-08-10 DIAGNOSIS — Z13820 Encounter for screening for osteoporosis: Secondary | ICD-10-CM

## 2024-08-10 NOTE — Patient Instructions (Signed)
 Ms. Joffe,  Thank you for taking the time for your Medicare Wellness Visit. I appreciate your continued commitment to your health goals. Please review the care plan we discussed, and feel free to reach out if I can assist you further.  Please note that Annual Wellness Visits do not include a physical exam. Some assessments may be limited, especially if the visit was conducted virtually. If needed, we may recommend an in-person follow-up with your provider.  Ongoing Care Seeing your primary care provider every 3 to 6 months helps us  monitor your health and provide consistent, personalized care.   Referrals If a referral was made during today's visit and you haven't received any updates within two weeks, please contact the referred provider directly to check on the status.  Recommended Screenings:  Health Maintenance  Topic Date Due   COVID-19 Vaccine (3 - Pfizer risk series) 05/02/2020   Medicare Annual Wellness Visit  10/12/2023   Osteoporosis screening with Bone Density Scan  05/28/2024   Zoster (Shingles) Vaccine (1 of 2) 08/15/2024*   Flu Shot  10/31/2024*   DTaP/Tdap/Td vaccine (3 - Td or Tdap) 01/04/2031   Colon Cancer Screening  11/11/2032   Pneumococcal Vaccine for age over 39  Completed   Hepatitis C Screening  Completed   Meningitis B Vaccine  Aged Out   Breast Cancer Screening  Discontinued   Stool Blood Test  Discontinued  *Topic was postponed. The date shown is not the original due date.       07/06/2024   11:07 AM  Advanced Directives  Does Patient Have a Medical Advance Directive? No  Would patient like information on creating a medical advance directive? No - Patient declined    Vision: Annual vision screenings are recommended for early detection of glaucoma, cataracts, and diabetic retinopathy. These exams can also reveal signs of chronic conditions such as diabetes and high blood pressure.  Dental: Annual dental screenings help detect early signs of oral  cancer, gum disease, and other conditions linked to overall health, including heart disease and diabetes.  Please see the attached documents for additional preventive care recommendations.

## 2024-08-10 NOTE — Progress Notes (Signed)
 "  Chief Complaint  Patient presents with   Medicare Wellness     Subjective:   Brittany Hensley is a 74 y.o. female who presents for a Medicare Annual Wellness Visit.  Visit info / Clinical Intake: Medicare Wellness Visit Type:: Subsequent Annual Wellness Visit Persons participating in visit and providing information:: patient Medicare Wellness Visit Mode:: Telephone If telephone:: video declined Since this visit was completed virtually, some vitals may be partially provided or unavailable. Missing vitals are due to the limitations of the virtual format.: Unable to obtain vitals - no equipment If Telephone or Video please confirm:: I connected with patient using audio/video enable telemedicine. I verified patient identity with two identifiers, discussed telehealth limitations, and patient agreed to proceed. Patient Location:: home Provider Location:: office Interpreter Needed?: No Pre-visit prep was completed: yes AWV questionnaire completed by patient prior to visit?: no Living arrangements:: (!) lives alone Patient's Overall Health Status Rating: very good Typical amount of pain: none Are you currently prescribed opioids?: no  Dietary Habits and Nutritional Risks How many meals a day?: 3 Eats fruit and vegetables daily?: yes Most meals are obtained by: preparing own meals In the last 2 weeks, have you had any of the following?: (!) nausea, vomiting, diarrhea Diabetic:: no  Functional Status Activities of Daily Living (to include ambulation/medication): Independent Ambulation: Independent Medication Administration: Independent Home Management (perform basic housework or laundry): Independent Manage your own finances?: yes Primary transportation is: driving Concerns about vision?: no *vision screening is required for [facility]* (last ov 2025/Dr. Jama) Concerns about hearing?: no  Fall Screening Falls in the past year?: 0 Number of falls in past year: 0 Was there an injury with  Fall?: 0 Fall Risk Category Calculator: 0 Patient Fall Risk Level: Low Fall Risk  Fall Risk Patient at Risk for Falls Due to: No Fall Risks Fall risk Follow up: Falls evaluation completed; Education provided  Home and Transportation Safety: All rugs have non-skid backing?: yes All stairs or steps have railings?: yes Grab bars in the bathtub or shower?: yes Have non-skid surface in bathtub or shower?: yes Good home lighting?: yes Regular seat belt use?: yes Hospital stays in the last year:: no  Cognitive Assessment Difficulty concentrating, remembering, or making decisions? : no Will 6CIT or Mini Cog be Completed: yes What year is it?: 0 points What month is it?: 0 points Give patient an address phrase to remember (5 components): [address]  [name] CHILD About what time is it?: 0 points Count backwards from 20 to 1: 0 points Say the months of the year in reverse: 0 points Repeat the address phrase from earlier: 0 points 6 CIT Score: 0 points  Advance Directives (For Healthcare) Does Patient Have a Medical Advance Directive?: No Would patient like information on creating a medical advance directive?: No - Patient declined  Reviewed/Updated  Reviewed/Updated: Reviewed All (Medical, Surgical, Family, Medications, Allergies, Care Teams, Patient Goals); Medical History; Surgical History; Family History; Medications; Allergies; Care Teams; Patient Goals    Allergies (verified) Augmentin  [amoxicillin -pot clavulanate], Famotidine , Klonopin [clonazepam], Other, Nitrofurantoin, Reglan [metoclopramide], and Sulfamethoxazole-trimethoprim   Current Medications (verified) Outpatient Encounter Medications as of 08/10/2024  Medication Sig   acetaminophen  (TYLENOL ) 500 MG tablet Take 500 mg by mouth 2 (two) times daily.   albuterol  (VENTOLIN  HFA) 108 (90 Base) MCG/ACT inhaler Inhale 2 puffs into the lungs every 6 (six) hours as needed for wheezing or shortness of breath.   ALPRAZolam   (XANAX ) 0.5 MG tablet Take 1 tablet (0.5 mg  total) by mouth 4 (four) times daily as needed for anxiety.   Ascorbic Acid  (VITAMIN C ) 1000 MG tablet Take 1,000 mg by mouth daily.   Biotin 10 MG CAPS Take 10 mg by mouth daily.   BLACK ELDERBERRY PO Take 50 mg by mouth daily.   calcium  citrate (CALCITRATE - DOSED IN MG ELEMENTAL CALCIUM ) 950 (200 Ca) MG tablet Take 2 tablets by mouth 2 (two) times daily.   Cholecalciferol  (VITAMIN D ) 2000 units tablet Take 2,000 Units by mouth 2 (two) times daily.   docusate sodium  (COLACE) 100 MG capsule Take 1 capsule (100 mg total) by mouth 2 (two) times daily.   fexofenadine  (ALLEGRA  ALLERGY) 180 MG tablet Take 1 tablet (180 mg total) by mouth daily.   folic acid  (FOLVITE ) 800 MCG tablet Take 1 tablet (800 mcg total) by mouth daily.   levothyroxine  (SYNTHROID ) 88 MCG tablet TAKE ONE (1) TABLET BY MOUTH EVERY DAY   MAGNESIUM  CITRATE PO Take 500 mg by mouth 2 (two) times daily.   meclizine (ANTIVERT) 12.5 MG tablet Take 12.5 mg by mouth 3 (three) times daily as needed for dizziness.   Multiple Vitamins-Minerals (CENTRUM SILVER) CHEW Chew 1 tablet by mouth 2 (two) times daily.   omeprazole  (PRILOSEC) 40 MG capsule Take 1 capsule (40 mg total) by mouth daily.   ondansetron  (ZOFRAN -ODT) 4 MG disintegrating tablet TAKE 1 TABLET EVERY 8 HOURS AS NEEDED FOR NAUSEA AND VOMITING   Polyethyl Glycol-Propyl Glycol (SYSTANE OP) Place 1 drop into both eyes daily as needed (dry eyes).   Probiotic Product (PROBIOTIC PO) Take 1 capsule by mouth daily.    sertraline  (ZOLOFT ) 100 MG tablet TAKE TWO (2) TABLETS BY MOUTH DAILY   sucralfate  (CARAFATE ) 1 g tablet Take 1 tablet (1 g total) by mouth 4 (four) times daily.   Turmeric Curcumin 500 MG CAPS Take 500 mg by mouth daily.   No facility-administered encounter medications on file as of 08/10/2024.    History: Past Medical History:  Diagnosis Date   Anemia    iron     Anxiety    Arthritis    COVID    2020 bad cold s/s lasted  1 week. 04/28/2021   Depression    Diverticulitis    Hx: of   GERD (gastroesophageal reflux disease)    at times for 15 yrs 04/28/2021   History of kidney stones    10 years ago   Hypothyroidism    Low iron     Pneumonia    2020   Rosacea    Hx: of   SCC (squamous cell carcinoma) 08/13/2021   in situ-left temple (CX35FU)   SCC (squamous cell carcinoma) 08/13/2021   well diff-right forehead (CX35FU)   Squamous cell carcinoma of skin 08/03/2021   in situ- left malar cheek (CX35FU)   Past Surgical History:  Procedure Laterality Date   APPENDECTOMY     BALLOON DILATION  06/16/2011   Procedure: BALLOON DILATION;  Surgeon: Oliva FORBES Boots, MD;  Location: [facility] ENDOSCOPY;  Service: Endoscopy;  Laterality: N/A;   BALLOON DILATION N/A 10/03/2013   Procedure: BALLOON DILATION;  Surgeon: Oliva FORBES Boots, MD;  Location: [facility] ENDOSCOPY;  Service: Endoscopy;  Laterality: N/A;   BALLOON DILATION N/A 08/10/2014   Procedure: BALLOON DILATION;  Surgeon: Oliva FORBES Boots, MD;  Location: [facility] ENDOSCOPY;  Service: Endoscopy;  Laterality: N/A;  from 12 - 15 cm dilation completed   BALLOON DILATION N/A 02/15/2015   Procedure: BALLOON DILATION;  Surgeon: Oliva Boots, MD;  Location: [facility] ENDOSCOPY;  Service: Endoscopy;  Laterality: N/A;   BALLOON DILATION N/A 11/12/2015   Procedure: BALLOON DILATION;  Surgeon: Oliva Boots, MD;  Location: [facility] ENDOSCOPY;  Service: Endoscopy;  Laterality: N/A;   BALLOON DILATION N/A 08/10/2016   Procedure: BALLOON DILATION;  Surgeon: Oliva Boots, MD;  Location: [facility] ENDOSCOPY;  Service: Endoscopy;  Laterality: N/A;   BALLOON DILATION N/A 08/07/2016   Procedure: BALLOON DILATION;  Surgeon: Oliva Boots, MD;  Location: [facility] ENDOSCOPY;  Service: Endoscopy;  Laterality: N/A;   BALLOON DILATION N/A 05/20/2017   Procedure: BALLOON DILATION;  Surgeon: Boots Oliva, MD;  Location: [facility] ENDOSCOPY;  Service: Endoscopy;  Laterality: N/A;   BALLOON DILATION N/A 12/02/2017   Procedure: BALLOON DILATION;  Surgeon:  Boots Oliva, MD;  Location: [facility] ENDOSCOPY;  Service: Endoscopy;  Laterality: N/A;   BALLOON DILATION N/A 07/19/2018   Procedure: BALLOON DILATION;  Surgeon: Boots Oliva, MD;  Location: [facility] ENDOSCOPY;  Service: Endoscopy;  Laterality: N/A;   BALLOON DILATION N/A 01/23/2019   Procedure: BALLOON DILATION;  Surgeon: Boots Oliva, MD;  Location: [facility] ENDOSCOPY;  Service: Endoscopy;  Laterality: N/A;   BALLOON DILATION N/A 07/13/2019   Procedure: BALLOON DILATION;  Surgeon: Boots Oliva, MD;  Location: [facility] ENDOSCOPY;  Service: Endoscopy;  Laterality: N/A;   BALLOON DILATION N/A 02/02/2020   Procedure: BALLOON DILATION;  Surgeon: Boots Oliva, MD;  Location: [facility] ENDOSCOPY;  Service: Endoscopy;  Laterality: N/A;   BALLOON DILATION N/A 07/16/2020   Procedure: BALLOON DILATION;  Surgeon: Boots Oliva, MD;  Location: [facility] ENDOSCOPY;  Service: Endoscopy;  Laterality: N/A;   BALLOON DILATION N/A 02/19/2021   Procedure: BALLOON DILATION;  Surgeon: Boots Oliva, MD;  Location: [facility] ENDOSCOPY;  Service: Endoscopy;  Laterality: N/A;   BALLOON DILATION N/A 09/30/2021   Procedure: BALLOON DILATION;  Surgeon: Boots Oliva, MD;  Location: [facility] ENDOSCOPY;  Service: Endoscopy;  Laterality: N/A;   BALLOON DILATION N/A 03/19/2022   Procedure: BALLOON DILATION;  Surgeon: Boots Oliva, MD;  Location: [facility] ENDOSCOPY;  Service: Gastroenterology;  Laterality: N/A;   BALLOON DILATION N/A 05/05/2023   Procedure: BALLOON DILATION;  Surgeon: Boots Oliva, MD;  Location: [facility] ENDOSCOPY;  Service: Gastroenterology;  Laterality: N/A;   CATARACT EXTRACTION, BILATERAL     2019   CHOLECYSTECTOMY OPEN  1979   COLONOSCOPY     Hx: of   CYSTOSCOPY/URETEROSCOPY/HOLMIUM LASER/STENT PLACEMENT Bilateral 05/06/2021   Procedure: CYSTOSCOPY BILATERAL RETROGRADE LEFT URETEROSCOPY/HOLMIUM LASER/STENT PLACEMENT;  Surgeon: Watt Rush, MD;  Location: [facility];  Service: Urology;  Laterality: Bilateral;   ESOPHAGOGASTRODUODENOSCOPY  06/16/2011    Procedure: ESOPHAGOGASTRODUODENOSCOPY (EGD);  Surgeon: Oliva FORBES Boots, MD;  Location: [facility] ENDOSCOPY;  Service: Endoscopy;  Laterality: N/A;   ESOPHAGOGASTRODUODENOSCOPY N/A 10/03/2013   Procedure: ESOPHAGOGASTRODUODENOSCOPY (EGD);  Surgeon: Oliva FORBES Boots, MD;  Location: [facility] ENDOSCOPY;  Service: Endoscopy;  Laterality: N/A;   ESOPHAGOGASTRODUODENOSCOPY N/A 08/10/2014   Procedure: ESOPHAGOGASTRODUODENOSCOPY (EGD);  Surgeon: Oliva FORBES Boots, MD;  Location: [facility] ENDOSCOPY;  Service: Endoscopy;  Laterality: N/A;   ESOPHAGOGASTRODUODENOSCOPY N/A 08/10/2016   Procedure: ESOPHAGOGASTRODUODENOSCOPY (EGD);  Surgeon: Oliva Boots, MD;  Location: [facility] ENDOSCOPY;  Service: Endoscopy;  Laterality: N/A;   ESOPHAGOGASTRODUODENOSCOPY N/A 07/19/2018   Procedure: ESOPHAGOGASTRODUODENOSCOPY (EGD);  Surgeon: Boots Oliva, MD;  Location: [facility];  Service: Endoscopy;  Laterality: N/A;   ESOPHAGOGASTRODUODENOSCOPY (EGD) WITH PROPOFOL  N/A 02/15/2015   Procedure: ESOPHAGOGASTRODUODENOSCOPY (EGD) WITH PROPOFOL ;  Surgeon: Oliva Boots, MD;  Location: [facility] ENDOSCOPY;  Service: Endoscopy;  Laterality: N/A;   ESOPHAGOGASTRODUODENOSCOPY (EGD) WITH PROPOFOL  N/A 11/12/2015  Procedure: ESOPHAGOGASTRODUODENOSCOPY (EGD) WITH PROPOFOL ;  Surgeon: Oliva Boots, MD;  Location: [facility] ENDOSCOPY;  Service: Endoscopy;  Laterality: N/A;   ESOPHAGOGASTRODUODENOSCOPY (EGD) WITH PROPOFOL  N/A 08/07/2016   Procedure: ESOPHAGOGASTRODUODENOSCOPY (EGD) WITH PROPOFOL ;  Surgeon: Oliva Boots, MD;  Location: [facility] ENDOSCOPY;  Service: Endoscopy;  Laterality: N/A;   ESOPHAGOGASTRODUODENOSCOPY (EGD) WITH PROPOFOL  N/A 05/20/2017   Procedure: ESOPHAGOGASTRODUODENOSCOPY (EGD) WITH PROPOFOL ;  Surgeon: Boots Oliva, MD;  Location: [facility] ENDOSCOPY;  Service: Endoscopy;  Laterality: N/A;   ESOPHAGOGASTRODUODENOSCOPY (EGD) WITH PROPOFOL  N/A 12/02/2017   Procedure: ESOPHAGOGASTRODUODENOSCOPY (EGD) WITH PROPOFOL ;  Surgeon: Boots Oliva, MD;  Location[facility] ENDOSCOPY;  Service: Endoscopy;   Laterality: N/A;  have c arm available   ESOPHAGOGASTRODUODENOSCOPY (EGD) WITH PROPOFOL  N/A 01/23/2019   Procedure: ESOPHAGOGASTRODUODENOSCOPY (EGD) WITH PROPOFOL ;  Surgeon: Boots Oliva, MD;  Location: [facility] ENDOSCOPY;  Service: Endoscopy;  Laterality: N/A;   ESOPHAGOGASTRODUODENOSCOPY (EGD) WITH PROPOFOL  N/A 07/13/2019   Procedure: ESOPHAGOGASTRODUODENOSCOPY (EGD) WITH PROPOFOL ;  Surgeon: Boots Oliva, MD;  Location: [facility] ENDOSCOPY;  Service: Endoscopy;  Laterality: N/A;   ESOPHAGOGASTRODUODENOSCOPY (EGD) WITH PROPOFOL  N/A 02/02/2020   Procedure: ESOPHAGOGASTRODUODENOSCOPY (EGD) WITH PROPOFOL ;  Surgeon: Boots Oliva, MD;  Location: [facility] ENDOSCOPY;  Service: Endoscopy;  Laterality: N/A;   ESOPHAGOGASTRODUODENOSCOPY (EGD) WITH PROPOFOL  N/A 07/16/2020   Procedure: ESOPHAGOGASTRODUODENOSCOPY (EGD) WITH PROPOFOL ;  Surgeon: Boots Oliva, MD;  Location: [facility] ENDOSCOPY;  Service: Endoscopy;  Laterality: N/A;   ESOPHAGOGASTRODUODENOSCOPY (EGD) WITH PROPOFOL  N/A 02/19/2021   Procedure: ESOPHAGOGASTRODUODENOSCOPY (EGD) WITH PROPOFOL ;  Surgeon: Boots Oliva, MD;  Location: [facility] ENDOSCOPY;  Service: Endoscopy;  Laterality: N/A;  with possible botox    ESOPHAGOGASTRODUODENOSCOPY (EGD) WITH PROPOFOL  N/A 09/30/2021   Procedure: ESOPHAGOGASTRODUODENOSCOPY (EGD) WITH PROPOFOL ;  Surgeon: Boots Oliva, MD;  Location: [facility] ENDOSCOPY;  Service: Endoscopy;  Laterality: N/A;   ESOPHAGOGASTRODUODENOSCOPY (EGD) WITH PROPOFOL  N/A 03/19/2022   Procedure: ESOPHAGOGASTRODUODENOSCOPY (EGD) WITH PROPOFOL ;  Surgeon: Boots Oliva, MD;  Location: [facility] ENDOSCOPY;  Service: Gastroenterology;  Laterality: N/A;   ESOPHAGOGASTRODUODENOSCOPY (EGD) WITH PROPOFOL  N/A 05/05/2023   Procedure: ESOPHAGOGASTRODUODENOSCOPY (EGD) WITH PROPOFOL ;  Surgeon: Boots Oliva, MD;  Location: [facility] ENDOSCOPY;  Service: Gastroenterology;  Laterality: N/A;  with balloon dilation   FOREIGN BODY REMOVAL  07/19/2018   Procedure: FOREIGN BODY REMOVAL;  Surgeon: Boots Oliva, MD;  Location:  [facility] ENDOSCOPY;  Service: Endoscopy;;   FRACTURE SURGERY     Hx: of left wrist 01/2021   GASTRECTOMY  [phone]   GASTRECTOMY N/A    X3   LITHOTRIPSY     Hx; of for kidney stones [id number]   SAVORY DILATION N/A 08/10/2014   Procedure: SAVORY DILATION;  Surgeon: Oliva FORBES Boots, MD;  Location: [facility] ENDOSCOPY;  Service: Endoscopy;  Laterality: N/A;   TOTAL HIP ARTHROPLASTY Right 12/30/2017   Procedure: RIGHT TOTAL HIP ARTHROPLASTY;  Surgeon: Margrette Taft FORBES, MD;  Location: [facility];  Service: Orthopedics;  Laterality: Right;   TOTAL HIP ARTHROPLASTY Left 02/17/2018   Procedure: TOTAL HIP ARTHROPLASTY;  Surgeon: Margrette Taft FORBES, MD;  Location: [facility];  Service: Orthopedics;  Laterality: Left;   TUBAL LIGATION     1975   Family History  Problem Relation Age of Onset   Diabetes Mother    Cancer - Prostate Brother    Social History   Occupational History   Not on file  Tobacco Use   Smoking status: Never   Smokeless tobacco: Never  Vaping Use   Vaping status: Never Used  Substance and Sexual Activity   Alcohol use: No   Drug use: No   Sexual activity:  Not Currently   Tobacco Counseling Counseling given: Yes  SDOH Screenings   Food Insecurity: No Food Insecurity (08/10/2024)  Housing: Unknown (08/10/2024)  Transportation Needs: No Transportation Needs (10/12/2022)  Utilities: Not At Risk (08/10/2024)  Alcohol Screen: Low Risk (10/12/2022)  Depression (PHQ2-9): Low Risk (08/10/2024)  Financial Resource Strain: Low Risk (10/12/2022)  Physical Activity: Sufficiently Active (08/10/2024)  Social Connections: Moderately Isolated (08/10/2024)  Stress: No Stress Concern Present (08/10/2024)  Tobacco Use: Low Risk (08/10/2024)  Health Literacy: Adequate Health Literacy (08/10/2024)   See flowsheets for full screening details  Depression Screen PHQ 2 & 9 Depression Scale- Over the past 2 weeks, how often have you been bothered by any of the following problems? Little interest or pleasure in doing  things: 0 Feeling down, depressed, or hopeless (PHQ Adolescent also includes...irritable): 0 PHQ-2 Total Score: 0     Goals Addressed             This Visit's Progress    Have 3 meals a day   On track    Continue to eat healthy and move.              Objective:    Today's Vitals   08/10/24 0941  BP: 115/61  Pulse: 72  Weight: 104 lb (47.2 kg)  Height: 1' (0.305 m)   Body mass index is 507.78 kg/m.  Hearing/Vision screen No results found. Immunizations and Health Maintenance Health Maintenance  Topic Date Due   COVID-19 Vaccine (3 - Pfizer risk series) 05/02/2020   Bone Density Scan  05/28/2024   Zoster Vaccines- Shingrix (1 of 2) 08/15/2024 (Originally 06/04/1970)   Influenza Vaccine  10/31/2024 (Originally 03/03/2024)   Medicare Annual Wellness (AWV)  08/10/2025   DTaP/Tdap/Td (3 - Td or Tdap) 01/04/2031   Colonoscopy  11/11/2032   Pneumococcal Vaccine: 50+ Years  Completed   Hepatitis C Screening  Completed   Meningococcal B Vaccine  Aged Out   Mammogram  Discontinued   COLON CANCER SCREENING ANNUAL FOBT  Discontinued        Assessment/Plan:  This is a routine wellness examination for Justyce.  Patient Care Team: Gladis Mustard, FNP as PCP - General (Family Medicine) Margrette Taft BRAVO, MD as Consulting Physician (Orthopedic Surgery) [place], Andrez SAUNDERS, PA-C (Inactive) as Physician Assistant (Dermatology) Livingston Rigg, MD as Consulting Physician (Dermatology)  I have personally reviewed and noted the following in the patients chart:   Medical and social history Use of alcohol, tobacco or illicit drugs  Current medications and supplements including opioid prescriptions. Functional ability and status Nutritional status Physical activity Advanced directives List of other physicians Hospitalizations, surgeries, and ER visits in previous 12 months Vitals Screenings to include cognitive, depression, and falls Referrals and  appointments  No orders of the defined types were placed in this encounter.  In addition, I have reviewed and discussed with patient certain preventive protocols, quality metrics, and best practice recommendations. A written personalized care plan for preventive services as well as general preventive health recommendations were provided to patient.   Ozie Ned, CMA   08/10/2024   Return in 1 year (on 08/10/2025).  After Visit Summary: (MyChart) Due to this being a telephonic visit, the after visit summary with patients personalized plan was offered to patient via MyChart   Nurse Notes: HM Addressed: DEXA ordered  "

## 2024-09-18 ENCOUNTER — Ambulatory Visit: Admitting: Urology

## 2024-10-05 ENCOUNTER — Inpatient Hospital Stay

## 2024-11-10 ENCOUNTER — Ambulatory Visit: Payer: Self-pay | Admitting: Nurse Practitioner

## 2024-11-10 ENCOUNTER — Other Ambulatory Visit

## 2025-01-04 ENCOUNTER — Inpatient Hospital Stay

## 2025-04-05 ENCOUNTER — Inpatient Hospital Stay: Admitting: Oncology

## 2025-04-05 ENCOUNTER — Inpatient Hospital Stay
